# Patient Record
Sex: Female | Born: 1935
Health system: Southern US, Community
[De-identification: ages and names within clinical notes are randomized; demographics above are authoritative.]

## PROBLEM LIST (undated history)

## (undated) DIAGNOSIS — H269 Unspecified cataract: Secondary | ICD-10-CM

## (undated) DIAGNOSIS — F419 Anxiety disorder, unspecified: Secondary | ICD-10-CM

## (undated) DIAGNOSIS — D649 Anemia, unspecified: Secondary | ICD-10-CM

## (undated) DIAGNOSIS — E785 Hyperlipidemia, unspecified: Secondary | ICD-10-CM

## (undated) DIAGNOSIS — G8929 Other chronic pain: Secondary | ICD-10-CM

## (undated) DIAGNOSIS — I739 Peripheral vascular disease, unspecified: Secondary | ICD-10-CM

## (undated) DIAGNOSIS — N63 Unspecified lump in unspecified breast: Secondary | ICD-10-CM

## (undated) DIAGNOSIS — M549 Dorsalgia, unspecified: Secondary | ICD-10-CM

## (undated) DIAGNOSIS — I10 Essential (primary) hypertension: Secondary | ICD-10-CM

## (undated) DIAGNOSIS — M1990 Unspecified osteoarthritis, unspecified site: Secondary | ICD-10-CM

## (undated) HISTORY — DX: Anemia, unspecified: D64.9

## (undated) HISTORY — PX: BACK SURGERY: SHX140

## (undated) HISTORY — PX: COLONOSCOPY: SHX174

## (undated) HISTORY — PX: CARDIOVASCULAR STRESS TEST: SHX262

## (undated) HISTORY — PX: APPENDECTOMY: SHX54

## (undated) HISTORY — PX: BUNIONECTOMY: SHX129

## (undated) HISTORY — PX: BREAST SURGERY: SHX581

## (undated) HISTORY — PX: DILATION AND CURETTAGE OF UTERUS: SHX78

## (undated) HISTORY — PX: TONSILLECTOMY: SUR1361

## (undated) HISTORY — PX: BREAST CYST EXCISION: SHX579

---

## 2003-11-05 ENCOUNTER — Inpatient Hospital Stay (HOSPITAL_COMMUNITY): Admission: RE | Admit: 2003-11-05 | Discharge: 2003-11-07 | Payer: Self-pay | Admitting: Neurosurgery

## 2009-07-28 ENCOUNTER — Encounter: Admission: RE | Admit: 2009-07-28 | Discharge: 2009-07-28 | Payer: Self-pay | Admitting: Neurosurgery

## 2009-09-29 ENCOUNTER — Encounter: Admission: RE | Admit: 2009-09-29 | Discharge: 2009-09-29 | Payer: Self-pay | Admitting: Neurosurgery

## 2011-03-18 NOTE — Op Note (Signed)
NAME:  [name] Mathews, [name] Mathews                          ACCOUNT NO.:  [id number]   MEDICAL RECORD NO.:  [id number]                   PATIENT TYPE:  INP   LOCATION:  3172                                 FACILITY:  [facility]   PHYSICIAN:  Cristi Loron, M.D.            DATE OF BIRTH:  Sep 17, 1934   DATE OF PROCEDURE:  11/05/2003  DATE OF DISCHARGE:                                 OPERATIVE REPORT   INDICATIONS FOR PROCEDURE:  The patient is a 77 year old black female who  has suffered from back and bilateral leg pain with the acute onset of severe  right leg pain.  She failed medical management and was worked up with a  lumbar MRI which demonstrated severe spinal stenosis at L2-3, L3-4, and L4-5  as well as an acute ruptured disk at L3-4 on the right.  I discussed the  various treatment options with the patient including surgery.  The patient  and her family weighed the risks, benefits, and alternatives to surgery and  decided to proceed with a decompressive lumbar laminectomy and  microdiskectomy.   PREOPERATIVE DIAGNOSIS:  L2-3, L3-4, L4-5 spinal stenosis, degenerative disk  disease, right L3-4 herniated nucleus pulposus, lumbar radiculopathy,  lumbago.   POSTOPERATIVE DIAGNOSIS:  L2-3, L3-4, L4-5 spinal stenosis, degenerative  disk disease, right L3-4 herniated nucleus pulposus, lumbar radiculopathy,  lumbago.   PROCEDURE:  L2, L3, and L4 extensive laminectomy, bilateral foraminotomy;  right L3-4 microdiskectomy; using microdissection.   SURGEON:  Cristi Loron, M.D.   ASSISTANT:  Danae Orleans. Venetia Maxon, M.D.   ANESTHESIA:  General endotracheal.   ESTIMATED BLOOD LOSS:  300 mL.   SPECIMENS:  None.   DRAINS:  None.   COMPLICATIONS:  None.   DESCRIPTION OF PROCEDURE:  The patient was brought to the operating room by  the anesthesia team.  General endotracheal anesthesia was induced.  The  patient was turned to the prone position on the Wilson frame.  The  lumbosacral region was  prepared with Betadine scrub and Betadine solution.  Sterile drapes were applied.  I then injected the area to be incised with  Marcaine with epinephrine solution.  I used a scalpel to make a linear  midline incision over the L2-3, L3-4, and L4-5 interspaces.  I used  electrocautery to dissect down to the thoracolumbar fascia and divided the  fascia bilaterally performing a bilateral subperiosteal dissection,  stripping the paraspinous musculature from the bilateral spinous processes  of the lamina of L2, L3, and L4.  I inserted the McCullough retractor for  exposure.  I then obtained the intraoperative radiograph to confirm our  location.  We began by incising the L1-2, L2-3, L3-4, and L4-5 interspinous  ligaments with the scalpel.  We then used the Leksell rongeur to remove the  spinous processes of L2, L3, and L4.  We then brought the operating  microscope into the field and under its magnification and illumination, we  completed the microdissection/decompression.  We used the high speed drill  to perform a bilateral L4, L3, and L2 laminotomy.  We then used the Kerrison  punch to remove the ligamentum flavum at L4-5, complete the L4 laminectomy,  remove the L3-4 ligamentum flavum, complete the L3 laminectomy, remove the  L2-3 ligamentum flavum, and complete the L2 laminectomy, and then remove the  ligamentum flavum at L1-2.  We then used microdissection to free up the  thecal sac from epidural tissue and then dissected down to the lateral  recesses.  There was considerable fibrosis of the ligamentum flavum and  medial facet joint.  We used the Kerrison punch to remove the excess  ligamentum flavum from the lateral recess and perform a generous  foraminotomy about the bilateral 3, 4, and 5 nerve roots.  We did this  bilaterally.  Then, Dr. Venetia Maxon gently retracted the right L3-4 thecal sac  medially and we inspected the intervertebral disk at L3-4. The disk was  bulging, but there was no  significant neural compression pending  herniations.  We dissected in a cephalad direction and noted a free fragment  of herniated disk which had clearly migrated in a cephalad direction and was  just ventral to the exiting right L3 nerve root as it exited the  neuroforamen.  We freed up the disk herniation using microdissection and  then removed it in multiple fragments using the micropituitary forceps and  the pituitary forceps.  We then palpated under the thecal sac and along the  exit route of the L3 nerve root all the way through the neuroforamen and  noted it was well decompressed and there were no more disk fragments.  We  did encounter some vigorous epidural bleeding once we removed the disk  herniation and this was controlled with bipolar electrocautery and Gelfoam.  WE then achieved astringent hemostasis using bipolar electrocautery.  We  copiously irrigated the wound out with bacitracin solution, removed the  solution, and then removed the [facility] retractor.  We then reapproximated  the patient's thoracolumbar fascia with interrupted #1 Vicryl suture,  subcutaneous tissue with interrupted 2-0 Vicryl suture, and the skin with  Steri-Strips and Benzoin.  The wound was then coated with bacitracin  ointment.  Sterile dressings were applied.  The drapes were removed and the  patient was subsequently returned to the supine position where she was  extubated by the anesthesia team and transported to the postanesthesia care  unit in stable condition.  All needle, sponge, and instrument counts  correct.                                               Cristi Loron, M.D.    JDJ/MEDQ  D:  11/05/2003  T:  11/06/2003  Job:  [id number]

## 2012-03-05 ENCOUNTER — Other Ambulatory Visit: Payer: Self-pay | Admitting: Neurosurgery

## 2012-03-05 DIAGNOSIS — M549 Dorsalgia, unspecified: Secondary | ICD-10-CM

## 2012-03-29 ENCOUNTER — Ambulatory Visit
Admission: RE | Admit: 2012-03-29 | Discharge: 2012-03-29 | Disposition: A | Discharge status: Home / Self Care | Payer: Medicare Other | Source: Ambulatory Visit | Attending: Neurosurgery | Admitting: Neurosurgery

## 2012-03-29 DIAGNOSIS — M549 Dorsalgia, unspecified: Secondary | ICD-10-CM

## 2012-03-29 MED ORDER — GADOBENATE DIMEGLUMINE 529 MG/ML IV SOLN
20.0000 mL | Freq: Once | INTRAVENOUS | Status: AC | PRN
  Administered 2012-03-29: 20 mL via INTRAVENOUS

## 2012-04-20 ENCOUNTER — Other Ambulatory Visit: Payer: Self-pay | Admitting: Neurosurgery

## 2012-04-20 ENCOUNTER — Other Ambulatory Visit (HOSPITAL_COMMUNITY): Payer: Self-pay | Admitting: Neurosurgery

## 2012-04-20 ENCOUNTER — Encounter (HOSPITAL_COMMUNITY): Payer: Self-pay | Admitting: Pharmacy Technician

## 2012-04-20 DIAGNOSIS — M545 Low back pain: Secondary | ICD-10-CM

## 2012-04-27 ENCOUNTER — Other Ambulatory Visit (HOSPITAL_COMMUNITY): Payer: Self-pay | Admitting: Neurosurgery

## 2012-04-27 ENCOUNTER — Ambulatory Visit (HOSPITAL_COMMUNITY)
Admission: RE | Admit: 2012-04-27 | Discharge: 2012-04-27 | Disposition: A | Discharge status: Home / Self Care | Payer: Medicare Other | Source: Ambulatory Visit | Attending: Neurosurgery | Admitting: Neurosurgery

## 2012-04-27 DIAGNOSIS — M47817 Spondylosis without myelopathy or radiculopathy, lumbosacral region: Secondary | ICD-10-CM | POA: Insufficient documentation

## 2012-04-27 DIAGNOSIS — M545 Low back pain: Secondary | ICD-10-CM

## 2012-04-27 LAB — GLUCOSE, CAPILLARY
Glucose-Capillary: 161 mg/dL — ABNORMAL HIGH (ref 70–99)
Glucose-Capillary: 136 mg/dL — ABNORMAL HIGH (ref 70–99)

## 2012-04-27 MED ORDER — ONDANSETRON HCL 4 MG/2ML IJ SOLN: 4.0000 mg | Freq: Four times a day (QID) | INTRAMUSCULAR | Status: DC | PRN

## 2012-04-27 MED ORDER — OXYCODONE-ACETAMINOPHEN 5-325 MG PO TABS: 1.0000 | ORAL_TABLET | ORAL | Status: DC | PRN

## 2012-04-27 MED ORDER — DIAZEPAM 5 MG PO TABS
ORAL_TABLET | ORAL | Status: AC
  Filled 2012-04-27: qty 2

## 2012-04-27 MED ORDER — MORPHINE SULFATE 4 MG/ML IJ SOLN: 2.0000 mg | INTRAMUSCULAR | Status: DC | PRN

## 2012-04-27 MED ORDER — DIAZEPAM 5 MG PO TABS
10.0000 mg | ORAL_TABLET | Freq: Once | ORAL | Status: AC
  Administered 2012-04-27: 10 mg via ORAL

## 2012-04-27 MED ORDER — IOHEXOL 180 MG/ML  SOLN
20.0000 mL | Freq: Once | INTRAMUSCULAR | Status: AC | PRN
  Administered 2012-04-27: 15 mL via INTRATHECAL

## 2012-04-27 MED ORDER — DIAZEPAM 5 MG PO TABS: 5.0000 mg | ORAL_TABLET | Freq: Once | ORAL | Status: DC

## 2012-04-27 NOTE — Discharge Instructions (Signed)
Myelography Care After These instructions give you information on caring for yourself after your procedure. Your doctor may also give you specific instructions. Call your doctor if you have any problems or questions after your procedure. HOME CARE  Lie down for 24 hours. Lie in any position with 1 pillow under your head.   For 24 hours, get up only to eat or use the bathroom. Take only 10 minutes to eat.   For 24 hours, drink enough fluids to keep your pee (urine) clear or pale yellow. No alcohol.   Take all medicine as told by your doctor.   Avoid heavy lifting and activity for 48 hours.   You may take the bandage off the day after your myelography.   Do not take a bath for 24 hours. Ask your doctor if it is okay to take a shower.  Finding out the results of your test Ask your doctor when your test results will be ready. Make sure you follow up and get the test results. GET HELP RIGHT AWAY IF:   Any of the places where the needles were put in:   Are puffy (swollen) or red.   Are sore or hot to the touch.   Are draining yellowish-white fluid (pus).   Are bleeding after 10 minutes of pressing down on the site. Have someone press on any place that is bleeding until seen by a doctor.   You have a lasting headache that is not helped by medicine.   You have a bad headache with a stiff neck or fever.   You have trouble breathing.   You feel sick to your stomach (nauseous) or throw up (vomit).   You have pain or cramping in your belly (abdomen).   You have a fever.  If you go to the emergency room, tell the doctor you had a myelogram. Take this paper with you to show the doctor. MAKE SURE YOU:  Understand these instructions.   Will watch your condition.   Will get help right away if you are not doing well or get worse.  Document Released: 07/26/2008 Document Revised: 10/06/2011 Document Reviewed: 07/26/2008 [employer] Patient Information 2012 [employer]. 

## 2012-04-27 NOTE — H&P (Signed)
Subjective: Patient is a 77 year old black female who I performed a decompressive laminectomy on several years ago. Patient has done well but has developed recurrent back and hip pain. She was worked up with an lumbar MRI which was equivocal. We discussed further workup with a lumbar mild CT. The patient decided proceed with that study after weighing the risks, benefits, and alternatives.   No past medical history on file.  No past surgical history on file.  Allergies  Allergen Reactions  . Januvia (Sitagliptin) Other (See Comments)    Numbness in mouth  . Simvastatin Other (See Comments)    tremors    History  Substance Use Topics  . Smoking status: Not on file  . Smokeless tobacco: Not on file  . Alcohol Use: Not on file    No family history on file. Prior to Admission medications   Medication Sig Start Date End Date Taking? Authorizing Provider  amLODipine (NORVASC) 10 MG tablet Take 10 mg by mouth daily.   Yes Historical Provider, MD  aspirin 81 MG tablet Take 81 mg by mouth daily.   Yes Historical Provider, MD  cetirizine (ZYRTEC) 10 MG tablet Take 10 mg by mouth daily.   Yes Historical Provider, MD  Ferrous Gluconate (IRON) 240 (27 FE) MG TABS Take 1 tablet by mouth daily.   Yes Historical Provider, MD  glyBURIDE-metformin (GLUCOVANCE) 5-500 MG per tablet Take 1 tablet by mouth 2 (two) times daily with a meal.   Yes Historical Provider, MD  rosuvastatin (CRESTOR) 10 MG tablet Take 5 mg by mouth daily.   Yes Historical Provider, MD  telmisartan (MICARDIS) 40 MG tablet Take 40 mg by mouth daily.   Yes Historical Provider, MD     Review of Systems  Positive ROS: As above  All other systems have been reviewed and were otherwise negative with the exception of those mentioned in the HPI and as above.  Objective: Vital signs in last 24 hours: Temp:  [98 F (36.7 C)] 98 F (36.7 C) (06/28 1610) Pulse Rate:  [83] 83  (06/28 0614) Resp:  [18] 18  (06/28 0614) BP: (135)/(81)  135/81 mmHg (06/28 0614) SpO2:  [97 %] 97 % (06/28 0614) Weight:  [98.431 kg (217 lb)] 98.431 kg (217 lb) (06/28 9604)  General Appearance: Alert, cooperative, no distress, appears stated age Head: Normocephalic, without obvious abnormality, atraumatic Eyes: PERRL, conjunctiva/corneas clear, EOM's intact, fundi benign, both eyes      Ears: Normal TM's and external ear canals, both ears Throat: Lips, mucosa, and tongue normal; teeth and gums normal Neck: Supple, symmetrical, trachea midline, no adenopathy; thyroid: No enlargement/tenderness/nodules; no carotid bruit or JVD Back: Symmetric, no curvature, ROM normal, no CVA tenderness the patient's lumbar incision is well-healed Lungs: Clear to auscultation bilaterally, respirations unlabored Heart: Regular rate and rhythm, S1 and S2 normal, no murmur, rub or gallop Abdomen: Soft, non-tender, bowel sounds active all four quadrants, no masses, no organomegaly Extremities: Extremities normal, atraumatic, no cyanosis or edema Pulses: 2+ and symmetric all extremities Skin: Skin color, texture, turgor normal, no rashes or lesions  NEUROLOGIC:   Mental status: alert and oriented, no aphasia, good attention span, Fund of knowledge/ memory ok Motor Exam - grossly normal Sensory Exam - grossly normal Reflexes:  Coordination - grossly normal Gait - grossly normal Balance - grossly normal Cranial Nerves: I: smell Not tested  II: visual acuity  OS: Normal    OD: Normal   II: visual fields Full to confrontation  II: pupils  Equal, round, reactive to light  III,VII: ptosis None  III,IV,VI: extraocular muscles  Full ROM  V: mastication Normal  V: facial light touch sensation  Normal  V,VII: corneal reflex  Present  VII: facial muscle function - upper  Normal  VII: facial muscle function - lower Normal  VIII: hearing Not tested  IX: soft palate elevation  Normal  IX,X: gag reflex Present  XI: trapezius strength  5/5  XI: sternocleidomastoid  strength 5/5  XI: neck flexion strength  5/5  XII: tongue strength  Normal    Data Review No results found for this basename: WBC, HGB, HCT, MCV, PLT   No results found for this basename: NA, K, CL, CO2, BUN, creatinine, glucose   No results found for this basename: INR, PROTIME    Assessment/Plan: Lumbago, lumbar radiculopathy: I discussed situation with the patient and her daughter's. We discussed further workup with a lumbar mild CT. I described the risks, benefits, and alternatives to the procedure. She was to proceed with the procedure.   Joann Jorge D 04/27/2012 7:36 AM

## 2012-06-22 ENCOUNTER — Other Ambulatory Visit: Payer: Self-pay | Admitting: Neurosurgery

## 2012-06-26 ENCOUNTER — Encounter (HOSPITAL_COMMUNITY): Payer: Self-pay | Admitting: Respiratory Therapy

## 2012-07-03 ENCOUNTER — Encounter (HOSPITAL_COMMUNITY): Payer: Self-pay

## 2012-07-03 ENCOUNTER — Encounter (HOSPITAL_COMMUNITY)
Admission: RE | Admit: 2012-07-03 | Discharge: 2012-07-03 | Disposition: A | Discharge status: Home / Self Care | Payer: Medicare Other | Source: Ambulatory Visit | Attending: Neurosurgery | Admitting: Neurosurgery

## 2012-07-03 HISTORY — DX: Anxiety disorder, unspecified: F41.9

## 2012-07-03 HISTORY — DX: Essential (primary) hypertension: I10

## 2012-07-03 HISTORY — DX: Peripheral vascular disease, unspecified: I73.9

## 2012-07-03 HISTORY — DX: Unspecified cataract: H26.9

## 2012-07-03 HISTORY — DX: Dorsalgia, unspecified: M54.9

## 2012-07-03 HISTORY — DX: Hyperlipidemia, unspecified: E78.5

## 2012-07-03 HISTORY — DX: Anemia, unspecified: D64.9

## 2012-07-03 HISTORY — DX: Other chronic pain: G89.29

## 2012-07-03 HISTORY — DX: Unspecified osteoarthritis, unspecified site: M19.90

## 2012-07-03 HISTORY — DX: Unspecified lump in unspecified breast: N63.0

## 2012-07-03 LAB — TYPE AND SCREEN
ABO/RH(D): A POS
Antibody Screen: NEGATIVE

## 2012-07-03 LAB — BASIC METABOLIC PANEL
Chloride: 99 mEq/L (ref 96–112)
GFR calc Af Amer: 48 mL/min — ABNORMAL LOW (ref >90)
GFR calc non Af Amer: 42 mL/min — ABNORMAL LOW (ref >90)
Glucose, Bld: 165 mg/dL — ABNORMAL HIGH (ref 70–99)
Potassium: 4.6 mEq/L (ref 3.5–5.1)
Sodium: 137 mEq/L (ref 135–145)

## 2012-07-03 LAB — CBC
Hemoglobin: 11.7 g/dL — ABNORMAL LOW (ref 12.0–15.0)
RBC: 4.26 MIL/uL (ref 3.87–5.11)

## 2012-07-03 MED ORDER — CEFAZOLIN SODIUM-DEXTROSE 2-3 GM-% IV SOLR
2.0000 g | INTRAVENOUS | Status: AC
  Administered 2012-07-04: 2 g via INTRAVENOUS
  Administered 2012-07-04: 1 g via INTRAVENOUS
  Filled 2012-07-03: qty 50

## 2012-07-03 NOTE — Pre-Procedure Instructions (Signed)
20 EMMERSEN GARRAWAY  07/03/2012   Your procedure is scheduled on:  Wednesday July 04, 2012  Report to [facility] at 6:30 AM.  Call this number if you have problems the morning of surgery: [phone]   Remember:   Do not eat food or drink:After Midnight.      Take these medicines the morning of surgery with A SIP OF WATER: amlodipine, zyrtec, ativan, tramadol,    Do not wear jewelry, make-up or nail polish.  Do not wear lotions, powders, or perfumes. You may wear deodorant.  Do not shave 48 hours prior to surgery. Men may shave face and neck.  Do not bring valuables to the hospital.  Contacts, dentures or bridgework may not be worn into surgery.  Leave suitcase in the car. After surgery it may be brought to your room.  For patients admitted to the hospital, checkout time is 11:00 AM the day of discharge.   Patients discharged the day of surgery will not be allowed to drive home.  Name and phone number of your driver: Silvio Pate or Jaydalyn Demattia (daughters)  Special Instructions: CHG Shower Use Special Wash: 1/2 bottle night before surgery and 1/2 bottle morning of surgery.   Please read over the following fact sheets that you were given: Pain Booklet, Coughing and Deep Breathing, Blood Transfusion Information, MRSA Information and Surgical Site Infection Prevention

## 2012-07-04 ENCOUNTER — Encounter (HOSPITAL_COMMUNITY)
Admission: RE | Disposition: A | Discharge status: Rehabilitation Facility | Payer: Self-pay | Source: Ambulatory Visit | Attending: Neurosurgery

## 2012-07-04 ENCOUNTER — Inpatient Hospital Stay (HOSPITAL_COMMUNITY): Payer: Medicare Other

## 2012-07-04 ENCOUNTER — Inpatient Hospital Stay (HOSPITAL_COMMUNITY)
Admission: RE | Admit: 2012-07-04 | Discharge: 2012-07-10 | DRG: 459 | Disposition: A | Discharge status: Rehabilitation Facility | Payer: Medicare Other | Source: Ambulatory Visit | Attending: Neurosurgery | Admitting: Neurosurgery

## 2012-07-04 ENCOUNTER — Encounter (HOSPITAL_COMMUNITY): Payer: Self-pay | Admitting: Anesthesiology

## 2012-07-04 ENCOUNTER — Inpatient Hospital Stay (HOSPITAL_COMMUNITY): Payer: Medicare Other | Admitting: Anesthesiology

## 2012-07-04 DIAGNOSIS — M129 Arthropathy, unspecified: Secondary | ICD-10-CM | POA: Diagnosis present

## 2012-07-04 DIAGNOSIS — I10 Essential (primary) hypertension: Secondary | ICD-10-CM | POA: Diagnosis present

## 2012-07-04 DIAGNOSIS — G92 Toxic encephalopathy: Secondary | ICD-10-CM | POA: Diagnosis not present

## 2012-07-04 DIAGNOSIS — R000 Tachycardia, unspecified: Secondary | ICD-10-CM | POA: Diagnosis not present

## 2012-07-04 DIAGNOSIS — Z01812 Encounter for preprocedural laboratory examination: Secondary | ICD-10-CM

## 2012-07-04 DIAGNOSIS — E785 Hyperlipidemia, unspecified: Secondary | ICD-10-CM | POA: Diagnosis present

## 2012-07-04 DIAGNOSIS — E871 Hypo-osmolality and hyponatremia: Secondary | ICD-10-CM | POA: Diagnosis not present

## 2012-07-04 DIAGNOSIS — E1149 Type 2 diabetes mellitus with other diabetic neurological complication: Secondary | ICD-10-CM | POA: Diagnosis present

## 2012-07-04 DIAGNOSIS — Q762 Congenital spondylolisthesis: Principal | ICD-10-CM

## 2012-07-04 DIAGNOSIS — G9340 Encephalopathy, unspecified: Secondary | ICD-10-CM | POA: Diagnosis not present

## 2012-07-04 DIAGNOSIS — M51379 Other intervertebral disc degeneration, lumbosacral region without mention of lumbar back pain or lower extremity pain: Secondary | ICD-10-CM | POA: Diagnosis present

## 2012-07-04 DIAGNOSIS — F411 Generalized anxiety disorder: Secondary | ICD-10-CM | POA: Diagnosis present

## 2012-07-04 DIAGNOSIS — M47817 Spondylosis without myelopathy or radiculopathy, lumbosacral region: Secondary | ICD-10-CM | POA: Diagnosis present

## 2012-07-04 DIAGNOSIS — E119 Type 2 diabetes mellitus without complications: Secondary | ICD-10-CM | POA: Diagnosis present

## 2012-07-04 DIAGNOSIS — Z7982 Long term (current) use of aspirin: Secondary | ICD-10-CM

## 2012-07-04 DIAGNOSIS — I739 Peripheral vascular disease, unspecified: Secondary | ICD-10-CM | POA: Diagnosis present

## 2012-07-04 DIAGNOSIS — N390 Urinary tract infection, site not specified: Secondary | ICD-10-CM | POA: Diagnosis not present

## 2012-07-04 DIAGNOSIS — M5137 Other intervertebral disc degeneration, lumbosacral region: Secondary | ICD-10-CM | POA: Diagnosis present

## 2012-07-04 DIAGNOSIS — D62 Acute posthemorrhagic anemia: Secondary | ICD-10-CM | POA: Diagnosis not present

## 2012-07-04 DIAGNOSIS — K5900 Constipation, unspecified: Secondary | ICD-10-CM | POA: Diagnosis not present

## 2012-07-04 DIAGNOSIS — Z79899 Other long term (current) drug therapy: Secondary | ICD-10-CM

## 2012-07-04 DIAGNOSIS — E1142 Type 2 diabetes mellitus with diabetic polyneuropathy: Secondary | ICD-10-CM | POA: Diagnosis present

## 2012-07-04 DIAGNOSIS — G929 Unspecified toxic encephalopathy: Secondary | ICD-10-CM | POA: Diagnosis not present

## 2012-07-04 LAB — GLUCOSE, CAPILLARY: Glucose-Capillary: 315 mg/dL — ABNORMAL HIGH (ref 70–99)

## 2012-07-04 SURGERY — POSTERIOR LUMBAR FUSION 2 LEVEL
Anesthesia: General

## 2012-07-04 MED ORDER — SODIUM CHLORIDE 0.9 % IV SOLN
INTRAVENOUS | Status: AC
  Filled 2012-07-04: qty 500

## 2012-07-04 MED ORDER — CEFAZOLIN SODIUM 1-5 GM-% IV SOLN
INTRAVENOUS | Status: AC
  Filled 2012-07-04: qty 50

## 2012-07-04 MED ORDER — DIPHENHYDRAMINE HCL 12.5 MG/5ML PO ELIX: 12.5000 mg | ORAL_SOLUTION | Freq: Four times a day (QID) | ORAL | Status: DC | PRN

## 2012-07-04 MED ORDER — FENTANYL CITRATE 0.05 MG/ML IJ SOLN
INTRAMUSCULAR | Status: DC | PRN
  Administered 2012-07-04 (×3): 50 ug via INTRAVENOUS
  Administered 2012-07-04: 150 ug via INTRAVENOUS
  Administered 2012-07-04: 50 ug via INTRAVENOUS

## 2012-07-04 MED ORDER — BACITRACIN 50000 UNITS IM SOLR
INTRAMUSCULAR | Status: AC
  Filled 2012-07-04: qty 1

## 2012-07-04 MED ORDER — BUPIVACAINE-EPINEPHRINE PF 0.5-1:200000 % IJ SOLN
INTRAMUSCULAR | Status: DC | PRN
  Administered 2012-07-04: 20 mL
  Administered 2012-07-04: 10 mL

## 2012-07-04 MED ORDER — DIAZEPAM 5 MG PO TABS: 5.0000 mg | ORAL_TABLET | Freq: Four times a day (QID) | ORAL | Status: DC | PRN

## 2012-07-04 MED ORDER — MORPHINE SULFATE (PF) 1 MG/ML IV SOLN
INTRAVENOUS | Status: DC
  Administered 2012-07-04: 16:00:00 via INTRAVENOUS

## 2012-07-04 MED ORDER — MORPHINE SULFATE 10 MG/ML IJ SOLN
INTRAMUSCULAR | Status: DC | PRN
  Administered 2012-07-04 (×2): 5 mg via INTRAVENOUS

## 2012-07-04 MED ORDER — GLYCOPYRROLATE 0.2 MG/ML IJ SOLN
INTRAMUSCULAR | Status: DC | PRN
  Administered 2012-07-04: .6 mg via INTRAVENOUS

## 2012-07-04 MED ORDER — OXYCODONE-ACETAMINOPHEN 5-325 MG PO TABS
1.0000 | ORAL_TABLET | ORAL | Status: DC | PRN
  Administered 2012-07-05: 2 via ORAL
  Filled 2012-07-04: qty 2

## 2012-07-04 MED ORDER — MENTHOL 3 MG MT LOZG
1.0000 | LOZENGE | OROMUCOSAL | Status: DC | PRN
  Filled 2012-07-04: qty 9

## 2012-07-04 MED ORDER — LIDOCAINE HCL (CARDIAC) 20 MG/ML IV SOLN
INTRAVENOUS | Status: DC | PRN
  Administered 2012-07-04: 50 mg via INTRAVENOUS

## 2012-07-04 MED ORDER — 0.9 % SODIUM CHLORIDE (POUR BTL) OPTIME
TOPICAL | Status: DC | PRN
  Administered 2012-07-04: 1000 mL

## 2012-07-04 MED ORDER — PROPOFOL 10 MG/ML IV EMUL
INTRAVENOUS | Status: DC | PRN
  Administered 2012-07-04: 120 mg via INTRAVENOUS

## 2012-07-04 MED ORDER — ROCURONIUM BROMIDE 100 MG/10ML IV SOLN
INTRAVENOUS | Status: DC | PRN
  Administered 2012-07-04: 50 mg via INTRAVENOUS

## 2012-07-04 MED ORDER — DOCUSATE SODIUM 100 MG PO CAPS
100.0000 mg | ORAL_CAPSULE | Freq: Two times a day (BID) | ORAL | Status: DC
  Administered 2012-07-04 – 2012-07-10 (×11): 100 mg via ORAL
  Filled 2012-07-04 (×11): qty 1

## 2012-07-04 MED ORDER — ACETAMINOPHEN 325 MG PO TABS
650.0000 mg | ORAL_TABLET | ORAL | Status: DC | PRN
  Administered 2012-07-05 – 2012-07-09 (×14): 650 mg via ORAL
  Filled 2012-07-04 (×13): qty 2

## 2012-07-04 MED ORDER — HEMOSTATIC AGENTS (NO CHARGE) OPTIME
TOPICAL | Status: DC | PRN
  Administered 2012-07-04 (×2): 1 via TOPICAL

## 2012-07-04 MED ORDER — ZOLPIDEM TARTRATE 5 MG PO TABS: 5.0000 mg | ORAL_TABLET | Freq: Every evening | ORAL | Status: DC | PRN

## 2012-07-04 MED ORDER — ONDANSETRON HCL 4 MG/2ML IJ SOLN
INTRAMUSCULAR | Status: DC | PRN
  Administered 2012-07-04: 4 mg via INTRAVENOUS

## 2012-07-04 MED ORDER — MORPHINE SULFATE (PF) 1 MG/ML IV SOLN
INTRAVENOUS | Status: AC
  Filled 2012-07-04: qty 25

## 2012-07-04 MED ORDER — LACTATED RINGERS IV SOLN
INTRAVENOUS | Status: DC
  Administered 2012-07-04: 19:00:00 via INTRAVENOUS

## 2012-07-04 MED ORDER — ONDANSETRON HCL 4 MG/2ML IJ SOLN: 4.0000 mg | Freq: Four times a day (QID) | INTRAMUSCULAR | Status: DC | PRN

## 2012-07-04 MED ORDER — LACTATED RINGERS IV SOLN
INTRAVENOUS | Status: DC | PRN
  Administered 2012-07-04 (×4): via INTRAVENOUS

## 2012-07-04 MED ORDER — VECURONIUM BROMIDE 10 MG IV SOLR
INTRAVENOUS | Status: DC | PRN
  Administered 2012-07-04 (×3): 2 mg via INTRAVENOUS
  Administered 2012-07-04: 3 mg via INTRAVENOUS

## 2012-07-04 MED ORDER — CEFAZOLIN SODIUM-DEXTROSE 2-3 GM-% IV SOLR
2.0000 g | Freq: Three times a day (TID) | INTRAVENOUS | Status: AC
  Administered 2012-07-04 – 2012-07-05 (×2): 2 g via INTRAVENOUS
  Filled 2012-07-04 (×2): qty 50

## 2012-07-04 MED ORDER — NEOSTIGMINE METHYLSULFATE 1 MG/ML IJ SOLN
INTRAMUSCULAR | Status: DC | PRN
  Administered 2012-07-04: 4 mg via INTRAVENOUS

## 2012-07-04 MED ORDER — NALOXONE HCL 0.4 MG/ML IJ SOLN: 0.4000 mg | INTRAMUSCULAR | Status: DC | PRN

## 2012-07-04 MED ORDER — INSULIN ASPART 100 UNIT/ML ~~LOC~~ SOLN
0.0000 [IU] | SUBCUTANEOUS | Status: DC
  Administered 2012-07-04: 15 [IU] via SUBCUTANEOUS
  Administered 2012-07-05: 3 [IU] via SUBCUTANEOUS
  Administered 2012-07-05 (×4): 4 [IU] via SUBCUTANEOUS
  Administered 2012-07-05: 7 [IU] via SUBCUTANEOUS
  Administered 2012-07-06: 4 [IU] via SUBCUTANEOUS
  Administered 2012-07-06: 7 [IU] via SUBCUTANEOUS
  Administered 2012-07-06 (×3): 4 [IU] via SUBCUTANEOUS
  Administered 2012-07-06: 7 [IU] via SUBCUTANEOUS
  Administered 2012-07-07 (×2): 4 [IU] via SUBCUTANEOUS
  Administered 2012-07-07: 7 [IU] via SUBCUTANEOUS
  Administered 2012-07-07: 4 [IU] via SUBCUTANEOUS
  Administered 2012-07-07: 04:00:00 via SUBCUTANEOUS
  Administered 2012-07-07: 4 [IU] via SUBCUTANEOUS
  Administered 2012-07-08: 3 [IU] via SUBCUTANEOUS
  Administered 2012-07-08: 4 [IU] via SUBCUTANEOUS
  Administered 2012-07-08: 7 [IU] via SUBCUTANEOUS
  Administered 2012-07-08: 4 [IU] via SUBCUTANEOUS

## 2012-07-04 MED ORDER — SODIUM CHLORIDE 0.9 % IR SOLN
Status: DC | PRN
  Administered 2012-07-04: 10:00:00

## 2012-07-04 MED ORDER — THROMBIN 5000 UNITS EX SOLR
CUTANEOUS | Status: DC | PRN
  Administered 2012-07-04 (×4): 5000 [IU] via TOPICAL

## 2012-07-04 MED ORDER — HYDROCODONE-ACETAMINOPHEN 5-325 MG PO TABS
1.0000 | ORAL_TABLET | ORAL | Status: DC | PRN
  Administered 2012-07-05: 1 via ORAL
  Filled 2012-07-04: qty 2

## 2012-07-04 MED ORDER — HEPARIN SODIUM (PORCINE) 1000 UNIT/ML IJ SOLN
INTRAMUSCULAR | Status: AC
  Filled 2012-07-04: qty 1

## 2012-07-04 MED ORDER — EPHEDRINE SULFATE 50 MG/ML IJ SOLN
INTRAMUSCULAR | Status: DC | PRN
  Administered 2012-07-04: 5 mg via INTRAVENOUS
  Administered 2012-07-04 (×2): 10 mg via INTRAVENOUS
  Administered 2012-07-04: 15 mg via INTRAVENOUS
  Administered 2012-07-04: 5 mg via INTRAVENOUS

## 2012-07-04 MED ORDER — PHENYLEPHRINE HCL 10 MG/ML IJ SOLN
INTRAMUSCULAR | Status: DC | PRN
  Administered 2012-07-04: 80 ug via INTRAVENOUS

## 2012-07-04 MED ORDER — SODIUM CHLORIDE 0.9 % IJ SOLN: 9.0000 mL | INTRAMUSCULAR | Status: DC | PRN

## 2012-07-04 MED ORDER — HYDROMORPHONE HCL PF 1 MG/ML IJ SOLN: 0.2500 mg | INTRAMUSCULAR | Status: DC | PRN

## 2012-07-04 MED ORDER — PHENOL 1.4 % MT LIQD
1.0000 | OROMUCOSAL | Status: DC | PRN
  Administered 2012-07-04: 1 via OROMUCOSAL
  Filled 2012-07-04: qty 177

## 2012-07-04 MED ORDER — OXYCODONE HCL 5 MG PO TABS: 5.0000 mg | ORAL_TABLET | Freq: Once | ORAL | Status: DC | PRN

## 2012-07-04 MED ORDER — OXYCODONE HCL 5 MG/5ML PO SOLN: 5.0000 mg | Freq: Once | ORAL | Status: DC | PRN

## 2012-07-04 MED ORDER — DIPHENHYDRAMINE HCL 50 MG/ML IJ SOLN: 12.5000 mg | Freq: Four times a day (QID) | INTRAMUSCULAR | Status: DC | PRN

## 2012-07-04 MED ORDER — ACETAMINOPHEN 650 MG RE SUPP
650.0000 mg | RECTAL | Status: DC | PRN
  Filled 2012-07-04: qty 1

## 2012-07-04 MED ORDER — BACITRACIN ZINC 500 UNIT/GM EX OINT
TOPICAL_OINTMENT | CUTANEOUS | Status: DC | PRN
  Administered 2012-07-04: 1 via TOPICAL

## 2012-07-04 MED ORDER — ONDANSETRON HCL 4 MG/2ML IJ SOLN: 4.0000 mg | INTRAMUSCULAR | Status: DC | PRN

## 2012-07-04 SURGICAL SUPPLY — 74 items
BAG DECANTER FOR FLEXI CONT (MISCELLANEOUS) ×2 IMPLANT
BENZOIN TINCTURE PRP APPL 2/3 (GAUZE/BANDAGES/DRESSINGS) ×2 IMPLANT
BLADE SURG ROTATE 9660 (MISCELLANEOUS) IMPLANT
BRUSH SCRUB EZ PLAIN DRY (MISCELLANEOUS) ×2 IMPLANT
BUR ACORN 6.0 (BURR) ×2 IMPLANT
BUR MATCHSTICK NEURO 3.0 LAGG (BURR) ×2 IMPLANT
CANISTER SUCTION 2500CC (MISCELLANEOUS) ×2 IMPLANT
CAP REVERE LOCKING (Cap) ×12 IMPLANT
CLOTH BEACON ORANGE TIMEOUT ST (SAFETY) ×2 IMPLANT
CONN CROSSLINK REV 6.35 48-60 (Connector) ×2 IMPLANT
CONNECTOR CRSLNK REV6.35 48-60 (Connector) ×1 IMPLANT
CONT SPEC 4OZ CLIKSEAL STRL BL (MISCELLANEOUS) ×2 IMPLANT
COVER BACK TABLE 24X17X13 BIG (DRAPES) IMPLANT
COVER TABLE BACK 60X90 (DRAPES) ×2 IMPLANT
DRAPE C-ARM 42X72 X-RAY (DRAPES) ×4 IMPLANT
DRAPE LAPAROTOMY 100X72X124 (DRAPES) ×2 IMPLANT
DRAPE POUCH INSTRU U-SHP 10X18 (DRAPES) ×2 IMPLANT
DRAPE PROXIMA HALF (DRAPES) ×2 IMPLANT
DRAPE SURG 17X23 STRL (DRAPES) ×8 IMPLANT
ELECT BLADE 4.0 EZ CLEAN MEGAD (MISCELLANEOUS) ×2
ELECT REM PT RETURN 9FT ADLT (ELECTROSURGICAL) ×2
ELECTRODE BLDE 4.0 EZ CLN MEGD (MISCELLANEOUS) ×1 IMPLANT
ELECTRODE REM PT RTRN 9FT ADLT (ELECTROSURGICAL) ×1 IMPLANT
GAUZE SPONGE 4X4 16PLY XRAY LF (GAUZE/BANDAGES/DRESSINGS) ×2 IMPLANT
GLOVE BIO SURGEON STRL SZ 6.5 (GLOVE) ×2 IMPLANT
GLOVE BIO SURGEON STRL SZ8.5 (GLOVE) ×4 IMPLANT
GLOVE BIOGEL PI IND STRL 7.0 (GLOVE) ×1 IMPLANT
GLOVE BIOGEL PI IND STRL 8 (GLOVE) ×1 IMPLANT
GLOVE BIOGEL PI IND STRL 8.5 (GLOVE) ×2 IMPLANT
GLOVE BIOGEL PI INDICATOR 7.0 (GLOVE) ×1
GLOVE BIOGEL PI INDICATOR 8 (GLOVE) ×1
GLOVE BIOGEL PI INDICATOR 8.5 (GLOVE) ×2
GLOVE ECLIPSE 7.5 STRL STRAW (GLOVE) ×4 IMPLANT
GLOVE EXAM NITRILE LRG STRL (GLOVE) IMPLANT
GLOVE EXAM NITRILE MD LF STRL (GLOVE) ×4 IMPLANT
GLOVE EXAM NITRILE XL STR (GLOVE) IMPLANT
GLOVE EXAM NITRILE XS STR PU (GLOVE) IMPLANT
GLOVE SS BIOGEL STRL SZ 8 (GLOVE) ×2 IMPLANT
GLOVE SUPERSENSE BIOGEL SZ 8 (GLOVE) ×2
GLOVE SURG SS PI 8.0 STRL IVOR (GLOVE) ×6 IMPLANT
GOWN BRE IMP SLV AUR LG STRL (GOWN DISPOSABLE) ×2 IMPLANT
GOWN BRE IMP SLV AUR XL STRL (GOWN DISPOSABLE) ×4 IMPLANT
GOWN STRL REIN 2XL LVL4 (GOWN DISPOSABLE) ×6 IMPLANT
KIT BASIN OR (CUSTOM PROCEDURE TRAY) ×2 IMPLANT
KIT ROOM TURNOVER OR (KITS) ×2 IMPLANT
NEEDLE HYPO 21X1.5 SAFETY (NEEDLE) IMPLANT
NEEDLE HYPO 22GX1.5 SAFETY (NEEDLE) ×2 IMPLANT
NS IRRIG 1000ML POUR BTL (IV SOLUTION) ×2 IMPLANT
PACK FOAM VITOSS 10CC (Orthopedic Implant) ×2 IMPLANT
PACK LAMINECTOMY NEURO (CUSTOM PROCEDURE TRAY) ×2 IMPLANT
PAD ARMBOARD 7.5X6 YLW CONV (MISCELLANEOUS) ×6 IMPLANT
PATTIES SURGICAL .5 X1 (DISPOSABLE) IMPLANT
PENCIL BUTTON HOLSTER BLD 10FT (ELECTRODE) ×2 IMPLANT
PUTTY 10ML ACTIFUSE ABX (Putty) ×2 IMPLANT
PUTTY 5ML ACTIFUSE ABX (Putty) ×2 IMPLANT
ROD REVERE 6.35 CURVED 85MM (Rod) ×4 IMPLANT
SCREW 7.5X50MM (Screw) ×12 IMPLANT
SPACER SUSTAIN O 10X26 12MM (Spacer) ×8 IMPLANT
SPONGE GAUZE 4X4 12PLY (GAUZE/BANDAGES/DRESSINGS) ×2 IMPLANT
SPONGE LAP 4X18 X RAY DECT (DISPOSABLE) IMPLANT
SPONGE NEURO XRAY DETECT 1X3 (DISPOSABLE) IMPLANT
SPONGE SURGIFOAM ABS GEL 100 (HEMOSTASIS) ×2 IMPLANT
STRIP CLOSURE SKIN 1/2X4 (GAUZE/BANDAGES/DRESSINGS) ×2 IMPLANT
SUT VIC AB 1 CT1 18XBRD ANBCTR (SUTURE) ×2 IMPLANT
SUT VIC AB 1 CT1 8-18 (SUTURE) ×2
SUT VIC AB 2-0 CP2 18 (SUTURE) ×4 IMPLANT
SYR 20CC LL (SYRINGE) IMPLANT
SYR 20ML ECCENTRIC (SYRINGE) ×2 IMPLANT
SYR BULB IRRIGATION 50ML (SYRINGE) ×2 IMPLANT
TAPE CLOTH SURG 4X10 WHT LF (GAUZE/BANDAGES/DRESSINGS) ×2 IMPLANT
TOWEL OR 17X24 6PK STRL BLUE (TOWEL DISPOSABLE) ×2 IMPLANT
TOWEL OR 17X26 10 PK STRL BLUE (TOWEL DISPOSABLE) ×2 IMPLANT
TRAY FOLEY CATH 14FRSI W/METER (CATHETERS) ×2 IMPLANT
WATER STERILE IRR 1000ML POUR (IV SOLUTION) ×2 IMPLANT

## 2012-07-04 NOTE — Anesthesia Preprocedure Evaluation (Addendum)
Anesthesia Evaluation  Patient identified by MRN, date of birth, ID band Patient awake    Reviewed: Allergy & Precautions, H&P , NPO status , Patient's Chart, lab work & pertinent test results  Airway Mallampati: II TM Distance: >3 FB Neck ROM: Full    Dental No notable dental hx. (+) Teeth Intact and Dental Advisory Given   Pulmonary neg pulmonary ROS,  breath sounds clear to auscultation  Pulmonary exam normal       Cardiovascular hypertension, On Medications Rhythm:Regular Rate:Normal     Neuro/Psych negative neurological ROS  negative psych ROS   GI/Hepatic negative GI ROS, Neg liver ROS,   Endo/Other  diabetes, Type 2, Oral Hypoglycemic Agents  Renal/GU negative Renal ROS  negative genitourinary   Musculoskeletal   Abdominal   Peds  Hematology negative hematology ROS (+)   Anesthesia Other Findings   Reproductive/Obstetrics negative OB ROS                          Anesthesia Physical Anesthesia Plan  ASA: III  Anesthesia Plan: General   Post-op Pain Management:    Induction: Intravenous  Airway Management Planned: Oral ETT  Additional Equipment:   Intra-op Plan:   Post-operative Plan: Extubation in OR  Informed Consent: I have reviewed the patients History and Physical, chart, labs and discussed the procedure including the risks, benefits and alternatives for the proposed anesthesia with the patient or authorized representative who has indicated his/her understanding and acceptance.   Dental advisory given  Plan Discussed with: CRNA  Anesthesia Plan Comments:         Anesthesia Quick Evaluation

## 2012-07-04 NOTE — Anesthesia Procedure Notes (Signed)
Procedure Name: Intubation Date/Time: 07/04/2012 9:58 AM Performed by: Quentin Ore Pre-anesthesia Checklist: Patient identified, Emergency Drugs available, Suction available, Patient being monitored and Timeout performed Patient Re-evaluated:Patient Re-evaluated prior to inductionOxygen Delivery Method: Circle system utilized Preoxygenation: Pre-oxygenation with 100% oxygen Intubation Type: IV induction Ventilation: Mask ventilation without difficulty and Oral airway inserted - appropriate to patient size Laryngoscope Size: Mac and 4 Grade View: Grade II Tube type: Oral Tube size: 7.5 mm Number of attempts: 1 Airway Equipment and Method: Stylet Placement Confirmation: ETT inserted through vocal cords under direct vision and breath sounds checked- equal and bilateral Secured at: 21 cm Tube secured with: Tape Dental Injury: Teeth and Oropharynx as per pre-operative assessment

## 2012-07-04 NOTE — Progress Notes (Signed)
Instructed on using PCA

## 2012-07-04 NOTE — Preoperative (Signed)
Beta Blockers   Reason not to administer Beta Blockers:Not Applicable 

## 2012-07-04 NOTE — Progress Notes (Signed)
Orthopedic Tech Progress Note Patient Details:  [name] Mathews [birth year] [id number]  Patient ID: [name], female   DOB: [birth year], 78 y.o.   MRN: [id number]   Shawnie Pons 07/04/2012, 6:50 PM CALLED BIO TECH FOR LUMBAR FUSION BRACE

## 2012-07-04 NOTE — Op Note (Signed)
Brief history: The patient is a 78 year old black female who I performed a decompressive laminectomy on many years ago. She has done well until recently when she developed increasing back buttocks and leg pain consistent with neurogenic claudication. She has failed medical management and was worked up with a lumbar myelo CT. This demonstrated the patient had a spondylolisthesis and spinal stenosis at multiple levels but most prominent at L3-4 and L4-5. I discussed the various treatment options with the patient and her family. The patient has weighed the risks, benefits, and alternatives surgery decided proceed with a redo laminectomy decompression and fusion at L3-4 and L4-5.  Preoperative diagnosis: L3-4 and L4-5 spondylolisthesis, Degenerative disc disease, spinal stenosis; lumbago; lumbar radiculopathy  Postoperative diagnosis: L3-4 and L4-5 spondylolisthesis, Degenerative disc disease, spinal stenosis; lumbago; lumbar radiculopathy  Procedure: Redo laminectomy at L3-4 and L4-5 to decompress the bilateral L3, L4 and L5 nerve roots(the work required to do this was in addition to the work required to do the posterior lumbar interbody fusion because of the patient's spinal stenosis, facet arthropathy. Etc. requiring a wide decompression of the nerve roots.); L3-4 and L4-5 posterior lumbar interbody fusion with local morselized autograft bone and Actifusebone graft extender; insertion of interbody prosthesis at L3-4 and L4-5 (globus peek interbody prosthesis); posterior segmental instrumentation from L3 to L5 with globus titanium pedicle screws and rods; posterior lateral arthrodesis at L3-4 and L4-5 with local morselized autograft bone and Vitoss bone graft extender.  Surgeon: Dr. Delma Officer  Asst.: Dr. Hilda Lias  Anesthesia: Gen. endotracheal  Estimated blood loss: 300 cc  Drains: None  Locations: None  Description of procedure: The patient was brought to the operating room by the  anesthesia team. General endotracheal anesthesia was induced. The patient was turned to the prone position on the Wilson frame. The patient's lumbosacral region was then prepared with Betadine scrub and Betadine solution. Sterile drapes were applied.  I then injected the area to be incised with Marcaine with epinephrine solution. I then used the scalpel to make a linear midline incision over the L3-4 and L4-5 interspace, incising through the previous surgical scar.. I then used electrocautery to perform a bilateral subperiosteal dissection exposing the remainder of the lamina and the facets of L3-4 and L4-5. We then obtained intraoperative radiograph to confirm our location. We then inserted the Verstrac retractor to provide exposure.  I began the decompression by using the high speed drill to perform widen the previous laminectomy at L3-4 and L4-5. I drilled until I encountered relatively non-scarred dura.. We then used the Kerrison punches to widen the laminectomy and removed the scar tissue.. We used the Kerrison punches to remove the medial facets at L3-4 and L4-5. We performed wide foraminotomies about the bilateral L3, L4 and L5 nerve roots completing the decompression.  We now turned our attention to the posterior lumbar interbody fusion. I used a scalpel to incise the intervertebral disc at L3-4 and L4-5. I then performed a partial intervertebral discectomy at L3-4 and L4-5 using the pituitary forceps. We prepared the vertebral endplates at L3-4 and L4-5 for the fusion by removing the soft tissues with the curettes. We then used the trial spacers to pick the appropriate sized interbody prosthesis. We prefilled his prosthesis with a combination of local morselized autograft bone that we obtained during the decompression as well as Actifuse bone graft extender. We inserted the prefilled prosthesis into the interspace at L3-4 and L4-5. There was a good snug fit of the prosthesis in the  interspace. We then  filled and the remainder of the intervertebral disc space with local morselized autograft bone and Actifuse. This completed the posterior lumbar interbody arthrodesis.  We now turned attention to the instrumentation. Under fluoroscopic guidance we cannulated the bilateral L3, L4 and L5 pedicles with the bone probe. We then removed the bone probe. He then tapped the pedicle with a 6.5 millimeter tap. We then removed the tap. We probed inside the tapped pedicle with a ball probe to rule out cortical breaches. We then inserted a 7.5 x 50 millimeter pedicle screw into the L3, L4 and L5 pedicles bilaterally under fluoroscopic guidance. We then palpated along the medial aspect of the pedicles to rule out cortical breaches. There were none. The nerve roots were not injured. We then connected the unilateral pedicle screws with a lordotic rod. We compressed the construct and secured the rod in place with the caps. We then tightened the caps appropriately. This completed the instrumentation from L3-L5.  We now turned our attention to the posterior lateral arthrodesis at L3-4 and L4-5. We used the high-speed drill to decorticate the remainder of the facets, pars, transverse process at L3-4 and L4-5. We then applied a combination of local morselized autograft bone and Vitoss bone graft extender over these decorticated posterior lateral structures. This completed the posterior lateral arthrodesis.  We then obtained hemostasis using bipolar electrocautery. We irrigated the wound out with bacitracin solution. We inspected the thecal sac and nerve roots and noted they were well decompressed. We then removed the retractor. We reapproximated patient's thoracolumbar fascia with interrupted #1 Vicryl suture. We reapproximated patient's subcutaneous tissue with interrupted 2-0 Vicryl suture. The reapproximated patient's skin with Steri-Strips and benzoin. The wound was then coated with bacitracin ointment. A sterile dressing was  applied. The drapes were removed. The patient was subsequently returned to the supine position where they were extubated by the anesthesia team. He was then transported to the post anesthesia care unit in stable condition. All sponge instrument and needle counts were correct at the end of this case.

## 2012-07-04 NOTE — Transfer of Care (Signed)
Immediate Anesthesia Transfer of Care Note  Patient: [name] Mathews  Procedure(s) Performed: Procedure(s) (LRB) with comments: POSTERIOR LUMBAR FUSION 2 LEVEL (N/A) - Lumbar three-four and Lumbar four-five redo laminectomies with posterior lumbar interbody fusion with interbody prothesis posterolateral arthrodesis and posterior segmental instrumentation  Patient Location: PACU  Anesthesia Type: General  Level of Consciousness: alert   Airway & Oxygen Therapy: Patient Spontanous Breathing and Patient connected to nasal cannula oxygen  Post-op Assessment: Report given to PACU RN, Post -op Vital signs reviewed and stable and Patient moving all extremities  Post vital signs: Reviewed and stable  Complications: No apparent anesthesia complications

## 2012-07-04 NOTE — H&P (Signed)
Subjective: The patient is a 78 year old black female who I performed a decompressive lumbar laminectomy on in 1994. The patient initially had good relief of her neurogenic claudication symptoms. More recently the patient has developed recurrent symptoms consistent with neurogenic claudication. She failed medical management and was worked up with a lumbar myelo CT. this demonstrated multilevel degenerative changes, stenosis and spondylolisthesis. The most significant of which was at L3-4 and L4-5. I discussed the various treatment options with the patient and her children. The patient has weighed the risks, benefits, and alternatives surgery and decided proceed with a redo L34 and 45 laminectomy with instrumentation and fusion. Past Medical History  Diagnosis Date  . Hyperlipidemia   . Anxiety   . Diabetes mellitus   . Arthritis   . Anemia     hx of  . Cataract     left eye  . Chronic back pain   . Hypertension     sees Dr. Margret Chance, [facility] internal medicine  . Peripheral vascular disease   . Breast lump     hx of    Past Surgical History  Procedure Date  . Appendectomy   . Breast surgery     cyst removed left side  . Tonsillectomy   . Dilation and curettage of uterus   . Back surgery   . Colonoscopy   . Cardiovascular stress test     Allergies  Allergen Reactions  . Januvia (Sitagliptin) Other (See Comments)    Numbness in mouth  . Simvastatin Other (See Comments)    tremors    History  Substance Use Topics  . Smoking status: Never Smoker   . Smokeless tobacco: Not on file  . Alcohol Use: No    No family history on file. Prior to Admission medications   Medication Sig Start Date End Date Taking? Authorizing Provider  amLODipine (NORVASC) 10 MG tablet Take 10 mg by mouth daily.   Yes Historical Provider, MD  aspirin 81 MG tablet Take 81 mg by mouth daily.   Yes Historical Provider, MD  cetirizine (ZYRTEC) 10 MG tablet Take 10 mg by mouth daily.   Yes  Historical Provider, MD  Ferrous Gluconate (IRON) 240 (27 FE) MG TABS Take 1 tablet by mouth daily.   Yes Historical Provider, MD  ferrous sulfate 325 (65 FE) MG tablet Take 325 mg by mouth daily with breakfast.   Yes Historical Provider, MD  glyBURIDE-metformin (GLUCOVANCE) 5-500 MG per tablet Take 1 tablet by mouth 2 (two) times daily with a meal.   Yes Historical Provider, MD  LORazepam (ATIVAN) 0.5 MG tablet Take 0.5 mg by mouth daily as needed. For anxiety   Yes Historical Provider, MD  rosuvastatin (CRESTOR) 10 MG tablet Take 5 mg by mouth daily.   Yes Historical Provider, MD  telmisartan (MICARDIS) 40 MG tablet Take 40 mg by mouth daily.   Yes Historical Provider, MD  traMADol (ULTRAM) 50 MG tablet Take 50 mg by mouth every 6 (six) hours as needed.   Yes Historical Provider, MD  Vitamin D, Ergocalciferol, (DRISDOL) 50000 UNITS CAPS Take 50,000 Units by mouth every 14 (fourteen) days.   Yes Historical Provider, MD     Review of Systems  Positive ROS: As above  All other systems have been reviewed and were otherwise negative with the exception of those mentioned in the HPI and as above.  Objective: Vital signs in last 24 hours: Temp:  [98 F (36.7 C)-98.2 F (36.8 C)] 98 F (36.7 C) (09/04  0745) Pulse Rate:  [80] 80  (09/04 0745) Resp:  [20] 20  (09/04 0745) BP: (130-150)/(82-84) 150/84 mmHg (09/04 0745) SpO2:  [96 %-99 %] 99 % (09/04 0745) Weight:  [101.197 kg (223 lb 1.6 oz)] 101.197 kg (223 lb 1.6 oz) (09/03 1108)  General Appearance: Alert, cooperative, no distress, appears stated age Head: Normocephalic, without obvious abnormality, atraumatic Eyes: PERRL, conjunctiva/corneas clear, EOM's intact, fundi benign, both eyes      Ears: Normal TM's and external ear canals, both ears Throat: Lips, mucosa, and tongue normal; teeth and gums normal Neck: Supple, symmetrical, trachea midline, no adenopathy; thyroid: No enlargement/tenderness/nodules; no carotid bruit or JVD Back:  Symmetric, no curvature, ROM normal, no CVA tenderness the patient's lumbar incision is well-healed Lungs: Clear to auscultation bilaterally, respirations unlabored Heart: Regular rate and rhythm, S1 and S2 normal, no murmur, rub or gallop Abdomen: Soft, non-tender, bowel sounds active all four quadrants, no masses, no organomegaly Extremities: Extremities normal, atraumatic, no cyanosis or edema Pulses: 2+ and symmetric all extremities Skin: Skin color, texture, turgor normal, no rashes or lesions  NEUROLOGIC:   Mental status: alert and oriented, no aphasia, good attention span, Fund of knowledge/ memory ok Motor Exam - grossly normal Sensory Exam - grossly normal Reflexes:  Coordination - grossly normal Gait - grossly normal Balance - grossly normal Cranial Nerves: I: smell Not tested  II: visual acuity  OS: Normal    OD: Normal   II: visual fields Full to confrontation  II: pupils Equal, round, reactive to light  III,VII: ptosis None  III,IV,VI: extraocular muscles  Full ROM  V: mastication Normal  V: facial light touch sensation  Normal  V,VII: corneal reflex  Present  VII: facial muscle function - upper  Normal  VII: facial muscle function - lower Normal  VIII: hearing Not tested  IX: soft palate elevation  Normal  IX,X: gag reflex Present  XI: trapezius strength  5/5  XI: sternocleidomastoid strength 5/5  XI: neck flexion strength  5/5  XII: tongue strength  Normal    Data Review Lab Results  Component Value Date   WBC 6.8 07/03/2012   HGB 11.7* 07/03/2012   HCT 36.0 07/03/2012   MCV 84.5 07/03/2012   PLT 227 07/03/2012   Lab Results  Component Value Date   NA 137 07/03/2012   K 4.6 07/03/2012   CL 99 07/03/2012   CO2 26 07/03/2012   BUN 19 07/03/2012   CREATININE 1.23* 07/03/2012   GLUCOSE 165* 07/03/2012   No results found for this basename: INR, PROTIME    Assessment/Plan: L3-4 and L4-5 disc degeneration, spondylosis eases, spinal stenosis, neurogenic claudication,  lumbago: I discussed the situation with the patient and her family. I have reviewed the myelo CT with them and pointed out the abnormalities. We have discussed the various treatment options including surgery. I have described the surgical option of a redo L3-4 and L4-5 laminectomy, instrumentation, and fusion. I've shown him surgical models. We have discussed the risks, benefits, alternatives and likelihood of achieving our goals with surgery. I've answered all the patient's and her family's questions. The patient like to proceed with the operation.   Adelaida Reindel D 07/04/2012 9:23 AM

## 2012-07-04 NOTE — Anesthesia Postprocedure Evaluation (Signed)
Anesthesia Post Note  Patient: [name] Mathews  Procedure(s) Performed: Procedure(s) (LRB): POSTERIOR LUMBAR FUSION 2 LEVEL (N/A)  Anesthesia type: General  Patient location: PACU  Post pain: Pain level controlled and Adequate analgesia  Post assessment: Post-op Vital signs reviewed, Patient's Cardiovascular Status Stable, Respiratory Function Stable, Patent Airway and Pain level controlled  Last Vitals:  Filed Vitals:   07/04/12 1730  BP:   Pulse: 88  Temp:   Resp: 15    Post vital signs: Reviewed and stable  Level of consciousness: awake, alert  and oriented  Complications: No apparent anesthesia complications

## 2012-07-05 ENCOUNTER — Inpatient Hospital Stay (HOSPITAL_COMMUNITY): Payer: Medicare Other

## 2012-07-05 ENCOUNTER — Encounter (HOSPITAL_COMMUNITY): Payer: Self-pay | Admitting: *Deleted

## 2012-07-05 DIAGNOSIS — IMO0002 Reserved for concepts with insufficient information to code with codable children: Secondary | ICD-10-CM

## 2012-07-05 DIAGNOSIS — Q762 Congenital spondylolisthesis: Secondary | ICD-10-CM

## 2012-07-05 LAB — CBC
HCT: 27.1 % — ABNORMAL LOW (ref 36.0–46.0)
MCHC: 32.1 g/dL (ref 30.0–36.0)
Platelets: 166 10*3/uL (ref 150–400)
RDW: 14.7 % (ref 11.5–15.5)
WBC: 9.6 10*3/uL (ref 4.0–10.5)

## 2012-07-05 LAB — URINALYSIS, ROUTINE W REFLEX MICROSCOPIC
Bilirubin Urine: NEGATIVE
Glucose, UA: NEGATIVE mg/dL
Hgb urine dipstick: NEGATIVE
Protein, ur: NEGATIVE mg/dL

## 2012-07-05 LAB — GLUCOSE, CAPILLARY
Glucose-Capillary: 203 mg/dL — ABNORMAL HIGH (ref 70–99)
Glucose-Capillary: 159 mg/dL — ABNORMAL HIGH (ref 70–99)

## 2012-07-05 LAB — BASIC METABOLIC PANEL
BUN: 19 mg/dL (ref 6–23)
Chloride: 98 mEq/L (ref 96–112)
GFR calc Af Amer: 44 mL/min — ABNORMAL LOW (ref >90)
GFR calc non Af Amer: 38 mL/min — ABNORMAL LOW (ref >90)
Potassium: 4.3 mEq/L (ref 3.5–5.1)
Sodium: 133 mEq/L — ABNORMAL LOW (ref 135–145)

## 2012-07-05 MED ORDER — MORPHINE SULFATE 2 MG/ML IJ SOLN: 2.0000 mg | INTRAMUSCULAR | Status: DC | PRN

## 2012-07-05 NOTE — Evaluation (Signed)
Occupational Therapy Evaluation Patient Details Name: [name] Mathews MRN: [id number] DOB: Nov 30, 1933 Today's Date: 07/05/2012 Time: 8657-8469 OT Time Calculation (min): 32 min  OT Assessment / Plan / Recommendation Clinical Impression  This 78 y.o. female admitted for lumbar fusion.  Pt. lethargic this date with poor arrousal.  PCA has been discontinued so hopefully, pt. will be more alert tomorrow and able to participate.  Pt. may benefit from post acute rehab depending on progress.  Family is very supportive and the plan is that pt. will discharge home with pt.  She will benefit from continued OT to maximize safety and independence with BADLs to allow her to achieve min A - supervision level.    OT Assessment  Patient needs continued OT Services    Follow Up Recommendations  Inpatient Rehab;Supervision/Assistance - 24 hour    Barriers to Discharge None    Equipment Recommendations  Rolling walker with 5" wheels    Recommendations for Other Services Rehab consult  Frequency  Min 2X/week    Precautions / Restrictions Precautions Precautions: Back Precaution Comments: Pt unable to recall any back precautions.  She was reinstructed in precautions, bur unable to recite them Required Braces or Orthoses: Spinal Brace Spinal Brace: Lumbar corset Restrictions Weight Bearing Restrictions: No       ADL  Eating/Feeding: Simulated;Moderate assistance Where Assessed - Eating/Feeding: Chair Grooming: Simulated;Wash/dry face;Wash/dry hands;Maximal assistance Where Assessed - Grooming: Supported sitting Upper Body Bathing: Simulated;+1 Total assistance Where Assessed - Upper Body Bathing: Supported sitting Lower Body Bathing: Simulated;+1 Total assistance Where Assessed - Lower Body Bathing: Supported sit to stand Upper Body Dressing: Simulated;+1 Total assistance Where Assessed - Upper Body Dressing: Supported sitting Lower Body Dressing: Simulated;+1 Total assistance Where Assessed -  Lower Body Dressing: Supported sit to Pharmacist, hospital: Chief of Staff: Patient Percentage: 50% Statistician Method: Stand pivot Equipment Used: Back brace;Rolling walker Transfers/Ambulation Related to ADLs: Mobility limited by lethargy.  Pt. requires total A +2 (pt ~40% ) and increased time to move sit to stand, and ~50% to transfer to chair.  Dtrs. assisting therapist ADL Comments: Pt. unable to maintain adequate arrousal to participate in BADLs this date and therefore, is max -total A.  Dtrs are very supportive    OT Diagnosis: Generalized weakness;Cognitive deficits  OT Problem List: Decreased strength;Decreased activity tolerance;Decreased cognition;Decreased safety awareness;Decreased knowledge of use of DME or AE;Decreased knowledge of precautions OT Treatment Interventions: Self-care/ADL training;DME and/or AE instruction;Therapeutic activities;Patient/family education   OT Goals Acute Rehab OT Goals OT Goal Formulation: With patient/family Time For Goal Achievement: 07/12/12 Potential to Achieve Goals: Good ADL Goals Pt Will Perform Grooming: with min assist;Standing at sink ADL Goal: Grooming - Progress: Goal set today Pt Will Perform Upper Body Bathing: with supervision;Sitting, chair ADL Goal: Upper Body Bathing - Progress: Goal set today Pt Will Perform Lower Body Bathing: with min assist;Sit to stand from chair;Sit to stand from bed ADL Goal: Lower Body Bathing - Progress: Goal set today Pt Will Perform Upper Body Dressing: with supervision;Sitting, bed;Sitting, chair ADL Goal: Upper Body Dressing - Progress: Goal set today Pt Will Perform Lower Body Dressing: with min assist;Sit to stand from bed;Sit to stand from chair ADL Goal: Lower Body Dressing - Progress: Goal set today Pt Will Transfer to Toilet: with min assist;Ambulation;3-in-1 ADL Goal: Toilet Transfer - Progress: Goal set today Pt Will Perform Toileting - Clothing  Manipulation: with min assist;Standing ADL Goal: Toileting - Clothing Manipulation - Progress: Goal set today Pt Will  Perform Toileting - Hygiene: with supervision;Sit to stand from 3-in-1/toilet ADL Goal: Toileting - Hygiene - Progress: Goal set today Pt Will Perform Tub/Shower Transfer: with min assist;Ambulation;Shower transfer ADL Goal: Web designer - Progress: Goal set today Additional ADL Goal #1: Pt/dtr will be independent with donning/doffing brace ADL Goal: Additional Goal #1 - Progress: Goal set today  Visit Information  Last OT Received On: 07/05/12 Assistance Needed: +2    Subjective Data  Subjective: "She's my daughter" Patient Stated Goal: Pt unable due to lethargy   Prior Functioning  Vision/Perception  Home Living Lives With: Alone;Daughter (lives alone but staying with daughter post surgery) Available Help at Discharge: Family;Available 24 hours/day Type of Home: House Home Access: Level entry Home Layout: Two level;Able to live on main level with bedroom/bathroom Bathroom Shower/Tub: Health visitor: Standard Home Adaptive Equipment: Straight cane;Grab bars in shower;Built-in shower seat;Bedside commode/3-in-1 Additional Comments: Most information was gathered from pt's daughter due to pt's low level of arousal. This information is for pt's daughter's home, which is where pt will be going after surgery. Prior Function Level of Independence: Independent Able to Take Stairs?: Yes Driving: Yes Vocation: Retired Comments: Pt. prefers tub baths and sponge baths to showering Communication Communication: No difficulties Dominant Hand: Right      Cognition  Overall Cognitive Status: Impaired Area of Impairment: Attention;Memory;Following commands Arousal/Alertness: Lethargic Orientation Level: Disoriented to;Time Behavior During Session: Lethargic Current Attention Level: Focused (due to lethargy) Memory: Decreased recall of  precautions Memory Deficits: Likely due to lethargy Following Commands: Follows one step commands inconsistently (likely due to lethargy) Cognition - Other Comments: Pt required constant verbal cueing for arousal level.    Extremity/Trunk Assessment Right Upper Extremity Assessment RUE ROM/Strength/Tone: WFL for tasks assessed Left Upper Extremity Assessment LUE ROM/Strength/Tone: WFL for tasks assessed Right Lower Extremity Assessment RLE ROM/Strength/Tone: [facility] for tasks assessed Left Lower Extremity Assessment LLE ROM/Strength/Tone: WFL for tasks assessed   Mobility  Shoulder Instructions  Bed Mobility Bed Mobility: Sit to Sidelying Right;Scooting to HOB Rolling Right: 3: Mod assist;With rail Right Sidelying to Sit: 2: Max assist;With rails;HOB flat Sit to Sidelying Right: 2: Max assist;HOB flat Scooting to HOB: 1: +2 Total assist Scooting to [facility]: Patient Percentage: 10% Details for Bed Mobility Assistance: Pt. requires max verbal and tactile cues due to lethargy Transfers Transfers: Sit to Stand;Stand to Sit Sit to Stand: 1: +2 Total assist;With upper extremity assist;From chair/3-in-1 Sit to Stand: Patient Percentage: 40% Stand to Sit: 1: +2 Total assist;To bed;With upper extremity assist Stand to Sit: Patient Percentage: 50% Details for Transfer Assistance: Pt. requires max verbal and tactile cues for hand positioning, to turn walker and sequence all movments due to lethargy       Exercise     Balance Balance Balance Assessed: Yes Static Sitting Balance Static Sitting - Balance Support: Bilateral upper extremity supported;Feet supported Static Sitting - Level of Assistance: 2: Max assist;4: Min assist Static Sitting - Comment/# of Minutes: Pt sitting EOB to attempt to increase level of arousal for PLOF questioning and tended to have LOB posteriorly, requiring maximum assist to corrrect LOB.   End of Session OT - End of Session Equipment Utilized During Treatment: Back  brace Activity Tolerance: Patient limited by fatigue Patient left: in bed;with call bell/phone within reach;with family/visitor present  GO     Neylan Koroma, Ursula Alert M 07/05/2012, 1:35 PM

## 2012-07-05 NOTE — Progress Notes (Signed)
Orthopedic Tech Progress Note Patient Details:  [name] Mathews [birth year] [id number]  Patient ID: [name], female   DOB: 08-13-1934, 78 y.o.   MRN: [id number] Brace order completed by Storm Frisk, Varnika Butz 07/05/2012, 5:00 PM

## 2012-07-05 NOTE — Progress Notes (Signed)
Patient ID: [name] Mathews, female   DOB: 1934/03/02, 78 y.o.   MRN: [id number] Subjective:  The patient is mildly somnolent but easily arousable. Her back is appropriately sore. I spoke with the patient's daughter.  Objective: Vital signs in last 24 hours: Temp:  [97.4 F (36.3 C)-101 F (38.3 C)] 101 F (38.3 C) (09/05 0720) Pulse Rate:  [84-110] 110  (09/05 0600) Resp:  [11-22] 18  (09/05 0800) BP: (101-126)/(41-62) 126/60 mmHg (09/05 0600) SpO2:  [94 %-99 %] 94 % (09/05 0800) Weight:  [101.152 kg (223 lb)] 101.152 kg (223 lb) (09/04 1745)  Intake/Output from previous day: 09/04 0701 - 09/05 0700 In: 3950 [I.V.:3950] Out: 555 [Urine:205; Blood:350] Intake/Output this shift:    Physical exam the patient is alert and oriented. Her strength is grossly normal in her lower extremities.  Lab Results:  Basename 07/05/12 0636 07/03/12 1216  WBC 9.6 6.8  HGB 8.7* 11.7*  HCT 27.1* 36.0  PLT 166 227   BMET  Basename 07/05/12 0636 07/03/12 1216  NA 133* 137  K 4.3 4.6  CL 98 99  CO2 24 26  GLUCOSE 161* 165*  BUN 19 19  CREATININE 1.32* 1.23*  CALCIUM 8.1* 9.6    Studies/Results: Dg Lumbar Spine 2-3 Views  07/04/2012  *RADIOLOGY REPORT*  Clinical Data: L3-L5 PLIF  LUMBAR SPINE - 2-3 VIEW  Comparison: Earlier same day  Findings: Two C-arm images show placement of pedicle screws bilaterally at L3, L4 and L5.  There has been discectomy with placement of interbody fusion material, grossly well positioned.  IMPRESSION: PLIF in progress L3-L5.   Original Report Authenticated By: Thomasenia Sales, M.D.    Dg Lumbar Spine 2-3 Views  07/04/2012  *RADIOLOGY REPORT*  Clinical Data: L3-4 and L4-5 PLIF  LUMBAR SPINE - 2-3 VIEW  Comparison: 03/29/2012  Findings: Initial lateral view shows a probe at the pedicle level of L4.  Second film shows tissue spreaders posteriorly with a probe posterior to the L3-4 disc level.  Third film confirms the probe posterior to the L3-4 disc level.  IMPRESSION:  L3-4 disc level localized.   Original Report Authenticated By: Thomasenia Sales, M.D.    Dg Chest Port 1 View  07/05/2012  *RADIOLOGY REPORT*  Clinical Data: Postoperative fever.  PORTABLE CHEST - 1 VIEW  Comparison: None.  Findings: Study is limited by portable technique and body habitus. No consolidative process, pneumothorax or effusion is identified. Heart size is upper normal.  IMPRESSION: No acute finding.   Original Report Authenticated By: Bernadene Bell. Maricela Curet, M.D.     Assessment/Plan: Postop day #1: We will mobilize the patient with PT and OT. I will DC her PCA.  Low-grade fevers: Her chest x-ray is unremarkable. We are awaiting the results of urinalysis and culture as well as blood cultures. But this is likely secondary to atelectasis.  Acute blood loss anemia: This is likely exacerbated by hemodilution. I'll check a CBC tomorrow  Hyponatremia/Increased creatinine: We will check a BMP tomorrow.  LOS: 1 day     [name] D 07/05/2012, 9:46 AM

## 2012-07-05 NOTE — Progress Notes (Signed)
Nutrition Brief Note  Patient identified on the Malnutrition Screening Tool (MST) report for weight loss, generating a score of 2.  I spoke with the patient and her daughter whom both deny any recent weight changes. They also report good appetite.   Body mass index is 33.91 kg/(m^2). Pt meets criteria for Obesity Class I based on current BMI.   Current diet order is CHO Modified Medium, patient is consuming approximately 100% of meals at this time. Labs and medications reviewed.   No nutrition interventions warranted at this time. If nutrition issues arise, please consult RD.   Kendell Bane RD, LDN, CNSC [phone] Pager [phone] After Hours Pager

## 2012-07-05 NOTE — Evaluation (Signed)
I have read and agree with assessment and plan. Fadumo Heng Helen Whitlow PT, DPT Pager: [phone] 

## 2012-07-05 NOTE — Progress Notes (Signed)
Patient temperature reassessed after one hour of giving tylenol. Temperature had risen to 100.8. Patient was uncovered and given cool packs and cool cloths to decrease temperature. Will assess again before shift change.

## 2012-07-05 NOTE — Progress Notes (Signed)
Patient's daughter came to see RN to say patient was "not acting right" and was "out of it" shortly after using PCA. She asked for her BP to be taken. BP was 116/58. O2 98%. CBG 156. Patient did seem to be less responsive, but was still A+Ox3, just slow to respond. Patient did have a temperature of 100.1. Tylenol was given and patient and daughter educated on use of IS. IS reached 750. Patient encouraged to take deep breaths. Per daughter's request, patient's PCA button was removed, and patient was told to call for pain so that oral analgesia could be given. Will cont to monitor.

## 2012-07-05 NOTE — Progress Notes (Signed)
UR COMPLETED  

## 2012-07-05 NOTE — Progress Notes (Signed)
Inpatient Diabetes Program Recommendations  AACE/ADA: New Consensus Statement on Inpatient Glycemic Control  Target Ranges:  Prepandial:   less than 140 mg/dL      Peak postprandial:   less than 180 mg/dL (1-2 hours)      Critically ill patients:  140 - 180 mg/dL  Pager:  [phone] Hours:  8 am-10pm   Reason for Visit: Elevated prandial glucose  Inpatient Diabetes Program Recommendations Correction (SSI): Change to ACHS Insulin - Meal Coverage: Add Novolog 3 units TID for meal coverage  Note: May also benefit from low dose Lantus while inpatient and not taking oral agents.  Alfredia Client PhD, RN, BC-ADM Diabetes Coordinator  Office:  [phone] Team Pager:  [phone]

## 2012-07-05 NOTE — Consult Note (Signed)
Physical Medicine and Rehabilitation Consult Reason for Consult: Lumbar L3-4 and L4-5 spondylolisthesis with radiculopathy Referring Physician: Dr. Lovell Sheehan   HPI: [name] Mathews is a 78 y.o. right-handed female with history of decompressive lumbar laminectomy in 1994. Admitted 07/04/2012 with progressive low back pain radiating to the lower extremity. She denied any bowel or bladder disturbances. Lumbar myelogram demonstrated multilevel degenerative changes, stenosis spondylolisthesis with radiculopathy. Underwent redo laminectomy at lumbar L3-4 and L4-5 to decompress the bilateral L3, L4 and L5 nerve roots with posterior lumbar interbody fusion 07/04/2012 per Dr. Lovell Sheehan. Fitted with aspen lumbar fusion brace. Postoperative pain control. Sequential compression devices were added for DVT prophylaxis. Physical and occupational therapy evaluations are pending. M.D. is requested physical medicine rehabilitation consult to consider inpatient rehabilitation services   Review of Systems  Gastrointestinal: Positive for constipation.  Musculoskeletal: Positive for myalgias and back pain.  Neurological: Positive for weakness.  All other systems reviewed and are negative.   Past Medical History  Diagnosis Date  . Hyperlipidemia   . Anxiety   . Diabetes mellitus   . Arthritis   . Anemia     hx of  . Cataract     left eye  . Chronic back pain   . Hypertension     sees Dr. Margret Chance, [facility] internal medicine  . Peripheral vascular disease   . Breast lump     hx of   Past Surgical History  Procedure Date  . Appendectomy   . Breast surgery     cyst removed left side  . Tonsillectomy   . Dilation and curettage of uterus   . Back surgery   . Colonoscopy   . Cardiovascular stress test    History reviewed. No pertinent family history. Social History:  reports that she has never smoked. She does not have any smokeless tobacco history on file. She reports that she does not drink  alcohol or use illicit drugs. Allergies:  Allergies  Allergen Reactions  . Januvia (Sitagliptin) Other (See Comments)    Numbness in mouth  . Simvastatin Other (See Comments)    tremors   Medications Prior to Admission  Medication Sig Dispense Refill  . amLODipine (NORVASC) 10 MG tablet Take 10 mg by mouth daily.      Marland Kitchen aspirin 81 MG tablet Take 81 mg by mouth daily.      . cetirizine (ZYRTEC) 10 MG tablet Take 10 mg by mouth daily.      . Ferrous Gluconate (IRON) 240 (27 FE) MG TABS Take 1 tablet by mouth daily.      . ferrous sulfate 325 (65 FE) MG tablet Take 325 mg by mouth daily with breakfast.      . glyBURIDE-metformin (GLUCOVANCE) 5-500 MG per tablet Take 1 tablet by mouth 2 (two) times daily with a meal.      . LORazepam (ATIVAN) 0.5 MG tablet Take 0.5 mg by mouth daily as needed. For anxiety      . rosuvastatin (CRESTOR) 10 MG tablet Take 5 mg by mouth daily.      Marland Kitchen telmisartan (MICARDIS) 40 MG tablet Take 40 mg by mouth daily.      . traMADol (ULTRAM) 50 MG tablet Take 50 mg by mouth every 6 (six) hours as needed.      . Vitamin D, Ergocalciferol, (DRISDOL) 50000 UNITS CAPS Take 50,000 Units by mouth every 14 (fourteen) days.        Home:    Functional History:   Functional Status:  Mobility:          ADL:    Cognition: Cognition Orientation Level: Oriented to person;Oriented to place;Oriented to situation;Disoriented to time    Blood pressure 126/60, pulse 110, temperature 100.8 F (38.2 C), temperature source Oral, resp. rate 22, height 5\' 8"  (1.727 m), weight 101.152 kg (223 lb), SpO2 96.00%. Physical Exam  Vitals reviewed. Constitutional: She is oriented to person, place, and time. She appears well-developed.       Obese.   HENT:  Head: Normocephalic.  Eyes:       Pupils round and reactive to light  Neck: Neck supple. No thyromegaly present.  Cardiovascular: Normal rate, regular rhythm and normal heart sounds.   Pulmonary/Chest: Effort normal and  breath sounds normal. No respiratory distress. She has no wheezes.       Occasional cough, rhonchi  Abdominal: Bowel sounds are normal. She exhibits no distension. There is no tenderness.  Musculoskeletal: She exhibits no edema.  Neurological: She is alert and oriented to person, place, and time.       Follows commands but slow to arouse. Moves all 4's but inconsistent. No gross sensory deficits. dtr's grossly 1+.   Skin:       Back incision is dressed. Brace at bedside  Psychiatric: She has a normal mood and affect. Her behavior is normal.    Results for orders placed during the hospital encounter of 07/04/12 (from the past 24 hour(s))  GLUCOSE, CAPILLARY     Status: Abnormal   Collection Time   07/04/12  7:47 AM      Component Value Range   Glucose-Capillary 160 (*) 70 - 99 mg/dL  GLUCOSE, CAPILLARY     Status: Abnormal   Collection Time   07/04/12  3:54 PM      Component Value Range   Glucose-Capillary 265 (*) 70 - 99 mg/dL  GLUCOSE, CAPILLARY     Status: Abnormal   Collection Time   07/04/12  6:53 PM      Component Value Range   Glucose-Capillary 315 (*) 70 - 99 mg/dL  GLUCOSE, CAPILLARY     Status: Abnormal   Collection Time   07/04/12 11:59 PM      Component Value Range   Glucose-Capillary 191 (*) 70 - 99 mg/dL   Comment 1 Notify RN    GLUCOSE, CAPILLARY     Status: Abnormal   Collection Time   07/05/12  4:16 AM      Component Value Range   Glucose-Capillary 156 (*) 70 - 99 mg/dL   Comment 1 Notify RN     Dg Lumbar Spine 2-3 Views  07/04/2012  *RADIOLOGY REPORT*  Clinical Data: L3-L5 PLIF  LUMBAR SPINE - 2-3 VIEW  Comparison: Earlier same day  Findings: Two C-arm images show placement of pedicle screws bilaterally at L3, L4 and L5.  There has been discectomy with placement of interbody fusion material, grossly well positioned.  IMPRESSION: PLIF in progress L3-L5.   Original Report Authenticated By: Thomasenia Sales, M.D.    Dg Lumbar Spine 2-3 Views  07/04/2012  *RADIOLOGY REPORT*   Clinical Data: L3-4 and L4-5 PLIF  LUMBAR SPINE - 2-3 VIEW  Comparison: 03/29/2012  Findings: Initial lateral view shows a probe at the pedicle level of L4.  Second film shows tissue spreaders posteriorly with a probe posterior to the L3-4 disc level.  Third film confirms the probe posterior to the L3-4 disc level.  IMPRESSION: L3-4 disc level localized.   Original Report Authenticated  By: Thomasenia Sales, M.D.     Assessment/Plan: Diagnosis: lumbar spondylolisthesis, stenosis with radiculopathy 1. Does the need for close, 24 hr/day medical supervision in concert with the patient's rehab needs make it unreasonable for this patient to be served in a less intensive setting? Yes 2. Co-Morbidities requiring supervision/potential complications: dm, hytn, obesity, post-op fever 3. Due to bladder management, bowel management, safety, skin/wound care, disease management, medication administration, pain management and patient education, does the patient require 24 hr/day rehab nursing? Yes 4. Does the patient require coordinated care of a physician, rehab nurse, PT (1-2 hrs/day, 5 days/week) and OT (1-2 hrs/day, 5 days/week) to address physical and functional deficits in the context of the above medical diagnosis(es)? Yes Addressing deficits in the following areas: balance, endurance, locomotion, strength, transferring, bowel/bladder control, bathing, dressing, feeding, grooming, toileting and psychosocial support 5. Can the patient actively participate in an intensive therapy program of at least 3 hrs of therapy per day at least 5 days per week? Potentially 6. The potential for patient to make measurable gains while on inpatient rehab is excellent 7. Anticipated functional outcomes upon discharge from inpatient rehab are mod I to supervision with PT, mod I to min assist with OT, n/a with SLP. 8. Estimated rehab length of stay to reach the above functional goals is: potentially 1-2 weeks 9. Does the patient  have adequate social supports to accommodate these discharge functional goals? Yes 10. Anticipated D/C setting: Home 11. Anticipated post D/C treatments: HH therapy 12. Overall Rehab/Functional Prognosis: excellent  RECOMMENDATIONS: This patient's condition is appropriate for continued rehabilitative care in the following setting: [facility] Patient has agreed to participate in recommended program. Yes and Potentially Note that insurance prior authorization may be required for reimbursement for recommended care.  Comment: Will follow along for medical stability and therapy assessments. Spoke with daughter at length this am.  Ivory Broad, MD    07/05/2012

## 2012-07-05 NOTE — Anesthesia Postprocedure Evaluation (Signed)
  Anesthesia Post-op Note  Patient: JALENE DEMO  Procedure(s) Performed: Procedure(s) (LRB) with comments: POSTERIOR LUMBAR FUSION 2 LEVEL (N/A) - Lumbar three-four and Lumbar four-five redo laminectomies with posterior lumbar interbody fusion with interbody prothesis posterolateral arthrodesis and posterior segmental instrumentation  Patient Location: PACU  Anesthesia Type: General  Level of Consciousness: awake and alert   Airway and Oxygen Therapy: Patient Spontanous Breathing  Post-op Pain: mild  Post-op Assessment: Post-op Vital signs reviewed, Patient's Cardiovascular Status Stable, Respiratory Function Stable, Patent Airway, No signs of Nausea or vomiting and Pain level controlled  Post-op Vital Signs: stable  Complications: No apparent anesthesia complications

## 2012-07-05 NOTE — Evaluation (Signed)
Physical Therapy Evaluation Patient Details Name: [name] Mathews MRN: [id number] DOB: December 24, 1933 Today's Date: 07/05/2012 Time: 4098-1191 PT Time Calculation (min): 29 min  PT Assessment / Plan / Recommendation Clinical Impression  Pt is a 78 yo female s/p L3-4, 4-5 fusion post op day 1. Pt presented to physical therapy evaluation with decreased mobility secondary to surgery and pain and will benefit from skilled physical therapy to address theses deficits. Evaluation limited by pt's decresed arousal level, with pt requring maximum verbal cueing to remain awake and attend to task. Pt was unable to put on her own glasses with multiple attempts and verbal as well as tactile cueing, requiring her daughter to put them on for her. Recommending  [facility] at discharge.    PT Assessment  Patient needs continued PT services    Follow Up Recommendations  Inpatient Rehab    Barriers to Discharge        Equipment Recommendations  Rolling walker with 5" wheels    Recommendations for Other Services Rehab consult   Frequency Min 5X/week    Precautions / Restrictions Precautions Precautions: Back Precaution Comments: Pt educated on long rolling technique for bed mobility and back precautions. Required Braces or Orthoses: Spinal Brace Spinal Brace: Lumbar corset Restrictions Weight Bearing Restrictions: No         Mobility  Bed Mobility Bed Mobility: Rolling Right;Right Sidelying to Sit Rolling Right: 3: Mod assist;With rail Right Sidelying to Sit: 2: Max assist;With rails;HOB flat Details for Bed Mobility Assistance: Pt required constant verbal cueing for sequencing and maintaining back precautions. Pt required facilitation at hip and upper trunk for log rolling to side and max assist/facilitation at upper trunk to come to full upright sitting EOB. Transfers Transfers: Editor, commissioning Transfers: 1: +2 Total assist Stand Pivot Transfers: Patient Percentage: 60% Details for  Transfer Assistance: Pt requried maximum verbal cueing for positioning of feet and weight shift forward to begin to initiate sit to stand for transfer and facilitation at sacrum and upper trunk to come to full upright stance, as well as for safe hand placement. During transfer, pt requried maximum verbal cues for sequencing  and foot palcement. Pt requried verbal  and tactile cues for safe hand placement prior to stand->sit and assist to conrtol descent. Ambulation/Gait Ambulation/Gait Assistance: Not tested (comment) Stairs: No    Exercises     PT Diagnosis: Difficulty walking;Abnormality of gait;Acute pain  PT Problem List: Decreased activity tolerance;Decreased balance;Decreased mobility;Decreased knowledge of use of DME;Decreased knowledge of precautions PT Treatment Interventions: DME instruction;Gait training;Stair training;Functional mobility training;Therapeutic activities;Therapeutic exercise;Balance training;Neuromuscular re-education;Patient/family education   PT Goals Acute Rehab PT Goals PT Goal Formulation: With patient Time For Goal Achievement: 07/12/12 Potential to Achieve Goals: Good Pt will Roll Supine to Right Side: with modified independence PT Goal: Rolling Supine to Right Side - Progress: Goal set today Pt will Roll Supine to Left Side: with modified independence PT Goal: Rolling Supine to Left Side - Progress: Goal set today Pt will go Supine/Side to Sit: with modified independence PT Goal: Supine/Side to Sit - Progress: Goal set today Pt will go Sit to Stand: with supervision PT Goal: Sit to Stand - Progress: Goal set today Pt will go Stand to Sit: with supervision PT Goal: Stand to Sit - Progress: Goal set today Pt will Ambulate: >150 feet;with supervision;with least restrictive assistive device PT Goal: Ambulate - Progress: Goal set today Additional Goals Additional Goal #1: Pt will be able to verbalize 3/3 back precautions  and demonstrate during all functional  mobility. PT Goal: Additional Goal #1 - Progress: Goal set today  Visit Information  Last PT Received On: 07/05/12 Assistance Needed: +2    Subjective Data  Subjective: "I am doing ok I guess."   Prior Functioning  Home Living Lives With: Alone;Daughter (lives alone but staying with daughter post surgery) Available Help at Discharge: Family;Available 24 hours/day Type of Home: House Home Access: Level entry Home Layout: Two level;Able to live on main level with bedroom/bathroom Bathroom Shower/Tub: Health visitor: Standard Home Adaptive Equipment: Straight cane;Grab bars in shower;Built-in shower seat Additional Comments: Most information was gathered from pt's daughter due to pt's low level of arousal. This information is for pt's daughter's home, which is where pt will be going after surgery. Prior Function Level of Independence: Independent Able to Take Stairs?: Yes Driving: Yes Communication Communication: No difficulties    Cognition  Overall Cognitive Status: Appears within functional limits for tasks assessed/performed Arousal/Alertness: Lethargic Orientation Level: Appears intact for tasks assessed Behavior During Session: Lethargic Cognition - Other Comments: Pt required constant verbal cueing for arousal level.    Extremity/Trunk Assessment Right Upper Extremity Assessment RUE ROM/Strength/Tone: WFL for tasks assessed Left Upper Extremity Assessment LUE ROM/Strength/Tone: WFL for tasks assessed Right Lower Extremity Assessment RLE ROM/Strength/Tone: WFL for tasks assessed Left Lower Extremity Assessment LLE ROM/Strength/Tone: WFL for tasks assessed   Balance Balance Balance Assessed: Yes Static Sitting Balance Static Sitting - Balance Support: Bilateral upper extremity supported;Feet supported Static Sitting - Level of Assistance: 2: Max assist;4: Min assist Static Sitting - Comment/# of Minutes: Pt sitting EOB to attempt to increase level of  arousal for PLOF questioning and tended to have LOB posteriorly, requiring maximum assist to corrrect LOB.  End of Session PT - End of Session Equipment Utilized During Treatment: Gait belt;Back brace Activity Tolerance: Patient limited by fatigue (decreased arousal level) Patient left: in chair;with call bell/phone within reach;with family/visitor present Nurse Communication: Mobility status  GP     Basia Mcginty 07/05/2012, 10:38 AM

## 2012-07-06 LAB — URINE CULTURE
Colony Count: NO GROWTH
Culture: NO GROWTH
Special Requests: NORMAL

## 2012-07-06 LAB — CBC
Hemoglobin: 8.4 g/dL — ABNORMAL LOW (ref 12.0–15.0)
MCV: 84.1 fL (ref 78.0–100.0)
Platelets: 143 10*3/uL — ABNORMAL LOW (ref 150–400)
RBC: 3.01 MIL/uL — ABNORMAL LOW (ref 3.87–5.11)
WBC: 11.8 10*3/uL — ABNORMAL HIGH (ref 4.0–10.5)

## 2012-07-06 LAB — BASIC METABOLIC PANEL
CO2: 25 mEq/L (ref 19–32)
Calcium: 8 mg/dL — ABNORMAL LOW (ref 8.4–10.5)
GFR calc non Af Amer: 34 mL/min — ABNORMAL LOW (ref >90)
Potassium: 3.8 mEq/L (ref 3.5–5.1)
Sodium: 131 mEq/L — ABNORMAL LOW (ref 135–145)

## 2012-07-06 LAB — GLUCOSE, CAPILLARY
Glucose-Capillary: 195 mg/dL — ABNORMAL HIGH (ref 70–99)
Glucose-Capillary: 180 mg/dL — ABNORMAL HIGH (ref 70–99)
Glucose-Capillary: 178 mg/dL — ABNORMAL HIGH (ref 70–99)
Glucose-Capillary: 235 mg/dL — ABNORMAL HIGH (ref 70–99)

## 2012-07-06 MED ORDER — FERROUS SULFATE 325 (65 FE) MG PO TABS
325.0000 mg | ORAL_TABLET | Freq: Three times a day (TID) | ORAL | Status: DC
  Administered 2012-07-06 – 2012-07-10 (×12): 325 mg via ORAL
  Filled 2012-07-06 (×14): qty 1

## 2012-07-06 MED ORDER — SODIUM CHLORIDE 0.9 % IV SOLN
INTRAVENOUS | Status: DC
  Administered 2012-07-06 – 2012-07-07 (×3): via INTRAVENOUS

## 2012-07-06 MED FILL — Sodium Chloride IV Soln 0.9%: INTRAVENOUS | Qty: 1000 | Status: AC

## 2012-07-06 MED FILL — Sodium Chloride Irrigation Soln 0.9%: Qty: 3000 | Status: AC

## 2012-07-06 MED FILL — Heparin Sodium (Porcine) Inj 1000 Unit/ML: INTRAMUSCULAR | Qty: 30 | Status: AC

## 2012-07-06 NOTE — Clinical Social Work Psychosocial (Signed)
     Clinical Social Work Department BRIEF PSYCHOSOCIAL ASSESSMENT 07/06/2012  Patient:  [name] Mathews, [name] Mathews     Account Number:  [id number]     Admit date:  07/04/2012  Clinical Social Worker:  Peggyann Shoals  Date/Time:  07/06/2012 03:29 PM  Referred by:  Physician  Date Referred:  07/06/2012 Referred for  SNF Placement   Other Referral:   Interview type:  Family Other interview type:    PSYCHOSOCIAL DATA Living Status:  ALONE Admitted from facility:   Level of care:   Primary support name:  Oretha Milch Primary support relationship to patient:  CHILD, ADULT Degree of support available:   Supportive    CURRENT CONCERNS Current Concerns  Post-Acute Placement   Other Concerns:    SOCIAL WORK ASSESSMENT / PLAN CSW met with pt and family to address consult for SNF. Currently PT is recommending CIR. CIR has evaluated pt for admission. CIR plans to admit pt on Monday. CSW introduced herself and explained role of social work. Pt shared that she would like to go to CIR for continued rehab.    CSW confimed with CIR that they plan to admit pt on Monday, 07/09/12. CSW will continue to follow during this admission. CSW will address SNF placement if discharge plan changes.   Assessment/plan status:  Psychosocial Support/Ongoing Assessment of Needs Other assessment/ plan:   Information/referral to community resources:   None at this time.    PATIENTS/FAMILYS RESPONSE TO PLAN OF CARE: Pt was very pleasant. Pt and family are agreeable to discharge plan to [facility].

## 2012-07-06 NOTE — Progress Notes (Signed)
Patient ID: [name] Mathews, female   DOB: 07-23-1934, 78 y.o.   MRN: [id number] Subjective:  The patient is mildly somnolent but easily arousable. She is pleasant. I spoke with her daughter.  Objective: Vital signs in last 24 hours: Temp:  [99.5 F (37.5 C)-102 F (38.9 C)] 99.8 F (37.7 C) (09/06 0600) Pulse Rate:  [99-110] 99  (09/06 0600) Resp:  [18] 18  (09/06 0600) BP: (107-134)/(50-65) 107/59 mmHg (09/06 0600) SpO2:  [94 %-98 %] 94 % (09/06 0600)  Intake/Output from previous day: 09/05 0701 - 09/06 0700 In: 440 [P.O.:440] Out: 3050 [Urine:3050] Intake/Output this shift:    Physical exam the patient is alert and oriented x2 (person in a hospital) her lower extremity strength is grossly normal.  Lab Results:  Basename 07/05/12 0636 07/03/12 1216  WBC 9.6 6.8  HGB 8.7* 11.7*  HCT 27.1* 36.0  PLT 166 227   BMET  Basename 07/05/12 0636 07/03/12 1216  NA 133* 137  K 4.3 4.6  CL 98 99  CO2 24 26  GLUCOSE 161* 165*  BUN 19 19  CREATININE 1.32* 1.23*  CALCIUM 8.1* 9.6    Studies/Results: Dg Lumbar Spine 2-3 Views  07/04/2012  *RADIOLOGY REPORT*  Clinical Data: L3-L5 PLIF  LUMBAR SPINE - 2-3 VIEW  Comparison: Earlier same day  Findings: Two C-arm images show placement of pedicle screws bilaterally at L3, L4 and L5.  There has been discectomy with placement of interbody fusion material, grossly well positioned.  IMPRESSION: PLIF in progress L3-L5.   Original Report Authenticated By: [name] Mathews, M.Mathews.    Dg Lumbar Spine 2-3 Views  07/04/2012  *RADIOLOGY REPORT*  Clinical Data: L3-4 and L4-5 PLIF  LUMBAR SPINE - 2-3 VIEW  Comparison: 03/29/2012  Findings: Initial lateral view shows a probe at the pedicle level of L4.  Second film shows tissue spreaders posteriorly with a probe posterior to the L3-4 disc level.  Third film confirms the probe posterior to the L3-4 disc level.  IMPRESSION: L3-4 disc level localized.   Original Report Authenticated By: [name] Mathews, M.Mathews.     Dg Chest Port 1 View  07/05/2012  *RADIOLOGY REPORT*  Clinical Data: Postoperative fever.  PORTABLE CHEST - 1 VIEW  Comparison: None.  Findings: Study is limited by portable technique and body habitus. No consolidative process, pneumothorax or effusion is identified. Heart size is upper normal.  IMPRESSION: No acute finding.   Original Report Authenticated By: [name] Mathews. [name] Mathews, M.Mathews.     Assessment/Plan: Postop day #2: We will mobilize the patient with PT and OT. She will likely need rehabilitation.  Hyponatremia: A BMP is pending.  Anemia: A CBC is pending.  Fevers: The patient's chest x-ray and urinalysis is okay. We are awaiting cultures.  Mild confusion: This is likely multifactorial. The patient's exam is nonfocal. I think this will likely improve with time.  LOS: 2 days     [name] Mathews 07/06/2012, 7:28 AM

## 2012-07-06 NOTE — Progress Notes (Signed)
Pt was not able to void after foley was taken out. At 0200, bladder scan done and it was . At 0500, pt still could not void, bladder scan done and it was . In and out cath done using sterile technique and was able to drain of clear yellow colored urine. Will continue to monitor pt.

## 2012-07-06 NOTE — Progress Notes (Signed)
Rehab admissions - Evaluated for possible admission.  I spoke with patient's daughter, Velna Hatchet.  Patient very sleepy during my visit.  Not medically ready for inpatient rehab today.  I will follow up on Monday.  If she does well over weekend, may potentially be able to admit to inpatient rehab on Monday.  Call me for questions.  #[phone]

## 2012-07-06 NOTE — Progress Notes (Signed)
Physical Therapy Treatment Patient Details Name: [name] Mathews MRN: [id number] DOB: 07/22/1934 Today's Date: 07/06/2012 Time: 1308-6578 PT Time Calculation (min): 41 min  PT Assessment / Plan / Recommendation Comments on Treatment Session  Pt requiring max verbal cues to maintain alertness during session.  Mobility limited by lethargy.      Follow Up Recommendations  Inpatient Rehab    Barriers to Discharge        Equipment Recommendations  Rolling walker with 5" wheels    Recommendations for Other Services Rehab consult  Frequency Min 5X/week   Plan Discharge plan remains appropriate;Frequency remains appropriate    Precautions / Restrictions Precautions Precautions: Back Precaution Comments: Pt unable to recall any back precautions.  She was reinstructed in precautions, bur unable to recite them Required Braces or Orthoses: Spinal Brace Spinal Brace: Lumbar corset;Applied in sitting position Restrictions Weight Bearing Restrictions: No   Pertinent Vitals/Pain 6/10 RN aware    Mobility  Bed Mobility Bed Mobility: Rolling Right;Right Sidelying to Sit Rolling Right: 3: Mod assist;With rail Right Sidelying to Sit: 1: +2 Total assist;With rails;HOB flat Right Sidelying to Sit: Patient Percentage: 40% Details for Bed Mobility Assistance: Pt requiring significant assistance today at both trunk and LE's.  Assist to bring BLE's off bed and +2 assist at trunk. Transfers Sit to Stand: 1: +2 Total assist;With upper extremity assist;From bed;From chair/3-in-1 Sit to Stand: Patient Percentage: 40% Stand to Sit: 1: +2 Total assist;With upper extremity assist;With armrests;To chair/3-in-1 Stand to Sit: Patient Percentage: 40% Details for Transfer Assistance: Pt requires max verbal cues and tactile cues for all aspects of transfer.  Poor ability to follow commands to sequence.  Pt had significant difficulty processing stand-sit. Ambulation/Gait Ambulation/Gait Assistance: 1: +2 Total  assist Ambulation/Gait: Patient Percentage: 60% Ambulation Distance (Feet): 15 Feet (then 10') Assistive device: Rolling walker Ambulation/Gait Assistance Details: Pt requiring assistance to steer RW.  Max verbal cues for safety. Gait Pattern: Step-to pattern;Decreased stride length;Shuffle;Narrow base of support Stairs: No       PT Goals Acute Rehab PT Goals PT Goal: Rolling Supine to Right Side - Progress: Not progressing PT Goal: Rolling Supine to Left Side - Progress: Not progressing PT Goal: Supine/Side to Sit - Progress: Not progressing PT Goal: Sit to Stand - Progress: Progressing toward goal PT Goal: Stand to Sit - Progress: Progressing toward goal PT Goal: Ambulate - Progress: Progressing toward goal Additional Goals Additional Goal #1: Pt will be able to verbalize 3/3 back precautions and demonstrate during all functional mobility. PT Goal: Additional Goal #1 - Progress: Not progressing  Visit Information  Last PT Received On: 07/06/12 Assistance Needed: +2 PT/OT Co-Evaluation/Treatment: Yes    Subjective Data  Subjective: Pt very lethargic, slow to respond even with yes/no's   Cognition  Overall Cognitive Status: Impaired Area of Impairment: Attention;Memory;Following commands Arousal/Alertness: Lethargic Orientation Level: Disoriented to;Time Behavior During Session: Lethargic Current Attention Level: Focused Memory: Decreased recall of precautions Memory Deficits: Likely due to lethargy Following Commands: Follows one step commands inconsistently Cognition - Other Comments: Pt required constant verbal cueing for arousal level.    Balance  Balance Balance Assessed: Yes Static Sitting Balance Static Sitting - Balance Support: Bilateral upper extremity supported;Feet supported Static Sitting - Level of Assistance: 3: Mod assist Static Sitting - Comment/# of Minutes: Pt leaning to right in sitting.  Max cues to maintain alertness and to correct LOB.  Leans  posteriorly also.  End of Session PT - End of Session Equipment Utilized During Treatment:  Gait belt;Back brace Activity Tolerance: Patient limited by fatigue;Treatment limited secondary to medical complications (Comment) (lethargy) Patient left: in chair;with call bell/phone within reach;with nursing in room Nurse Communication: Mobility status;Other (comment) (Lethargy and hypotension)    Newell Coral 07/06/2012, 1:22 PM  Newell Coral, PTA Acute Rehab [phone] (office)
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
## 2012-07-06 NOTE — Progress Notes (Signed)
Occupational Therapy Treatment Patient Details Name: [name] Mathews MRN: [id number] DOB: 1934-02-09 Today's Date: 07/06/2012 Time: 4098-1191 OT Time Calculation (min): 40 min  OT Assessment / Plan / Recommendation Comments on Treatment Session Pt extremely lethargic throughout session.      Follow Up Recommendations  Inpatient Rehab;Supervision/Assistance - 24 hour    Barriers to Discharge       Equipment Recommendations  Defer to next venue    Recommendations for Other Services Rehab consult  Frequency Min 2X/week   Plan Discharge plan remains appropriate    Precautions / Restrictions Precautions Precautions: Back Precaution Comments: Pt unable to recall any back precautions.  She was reinstructed in precautions, bur unable to recite them Required Braces or Orthoses: Spinal Brace Spinal Brace: Lumbar corset;Applied in sitting position Restrictions Weight Bearing Restrictions: No   Pertinent Vitals/Pain See vitals    ADL  Eating/Feeding: Performed;+1 Total assistance Where Assessed - Eating/Feeding: Chair Upper Body Dressing: Performed;Maximal assistance Where Assessed - Upper Body Dressing: Supported sitting Toilet Transfer: Performed;+2 Total assistance Toilet Transfer: Patient Percentage: 40% Toilet Transfer Method: Sit to stand (ambulating) Acupuncturist: Grab bars;Comfort height toilet Toileting - Clothing Manipulation and Hygiene: Performed;+1 Total assistance Where Assessed - Toileting Clothing Manipulation and Hygiene: Sit to stand from 3-in-1 or toilet Equipment Used: Back brace;Rolling walker Transfers/Ambulation Related to ADLs: Assist to steer RW and for safety. Pt requires significantly increased time. ADL Comments: Pt very lethargic and required max cueing to remain awake.    OT Diagnosis:    OT Problem List:   OT Treatment Interventions:     OT Goals ADL Goals Pt Will Perform Upper Body Dressing: with supervision;Sitting, bed;Sitting,  chair ADL Goal: Upper Body Dressing - Progress: Progressing toward goals Pt Will Transfer to Toilet: with min assist;Ambulation;3-in-1 ADL Goal: Toilet Transfer - Progress: Progressing toward goals (slowly progressing) Pt Will Perform Toileting - Clothing Manipulation: with min assist;Standing ADL Goal: Toileting - Clothing Manipulation - Progress: Progressing toward goals Pt Will Perform Toileting - Hygiene: with supervision;Sit to stand from 3-in-1/toilet ADL Goal: Toileting - Hygiene - Progress: Progressing toward goals Additional ADL Goal #1: Pt/dtr will be independent with donning/doffing brace ADL Goal: Additional Goal #1 - Progress: Progressing toward goals  Visit Information  Last OT Received On: 07/06/12 Assistance Needed: +2    Subjective Data      Prior Functioning       Cognition  Overall Cognitive Status: Impaired Area of Impairment: Attention;Memory;Following commands Arousal/Alertness: Lethargic Orientation Level: Disoriented to;Time Behavior During Session: Lethargic Current Attention Level: Focused Memory: Decreased recall of precautions Memory Deficits: Likely due to lethargy Following Commands: Follows one step commands inconsistently Cognition - Other Comments: Pt required constant verbal cueing for arousal level.    Mobility  Shoulder Instructions Bed Mobility Bed Mobility: Rolling Right;Right Sidelying to Sit Rolling Right: 3: Mod assist;With rail Right Sidelying to Sit: 1: +2 Total assist;With rails;HOB flat Right Sidelying to Sit: Patient Percentage: 40% Details for Bed Mobility Assistance: Max assist to thread bil UEs through gown. Total assist for donning back brace. Pt requiring significant assistance today at both trunk and LE's.  Assist to bring BLE's off bed and +2 assist at trunk. Transfers Sit to Stand: 1: +2 Total assist;With upper extremity assist;From bed;From chair/3-in-1 Sit to Stand: Patient Percentage: 40% Stand to Sit: 1: +2 Total  assist;With upper extremity assist;With armrests;To chair/3-in-1 Stand to Sit: Patient Percentage: 40% Details for Transfer Assistance: Pt requires max verbal cues and tactile cues for all aspects  of transfer.  Poor ability to follow commands to sequence.  Pt had significant difficulty processing stand-sit.       Exercises      Balance Balance Balance Assessed: Yes Static Sitting Balance Static Sitting - Balance Support: Bilateral upper extremity supported;Feet supported Static Sitting - Level of Assistance: 3: Mod assist Static Sitting - Comment/# of Minutes: Pt leaning to right in sitting.  Max cues to maintain alertness and to correct LOB.  Leans posteriorly also.   End of Session OT - End of Session Equipment Utilized During Treatment: Back brace Activity Tolerance: Patient limited by fatigue Patient left: in chair;with call bell/phone within reach;with nursing in room Nurse Communication: Mobility status  GO   07/06/2012 Cipriano Mile OTR/L Pager [phone] Office [phone]   Cipriano Mile 07/06/2012, 2:35 PM

## 2012-07-07 ENCOUNTER — Inpatient Hospital Stay (HOSPITAL_COMMUNITY): Payer: Medicare Other

## 2012-07-07 LAB — CBC WITH DIFFERENTIAL/PLATELET
Basophils Absolute: 0 10*3/uL (ref 0.0–0.1)
Basophils Absolute: 0 10*3/uL (ref 0.0–0.1)
Basophils Relative: 0 % (ref 0–1)
Basophils Relative: 0 % (ref 0–1)
Eosinophils Absolute: 0.1 10*3/uL (ref 0.0–0.7)
Eosinophils Absolute: 0.1 10*3/uL (ref 0.0–0.7)
Eosinophils Relative: 1 % (ref 0–5)
Eosinophils Relative: 1 % (ref 0–5)
HCT: 26 % — ABNORMAL LOW (ref 36.0–46.0)
Hemoglobin: 8.6 g/dL — ABNORMAL LOW (ref 12.0–15.0)
MCH: 27.8 pg (ref 26.0–34.0)
MCH: 27.5 pg (ref 26.0–34.0)
MCHC: 33.3 g/dL (ref 30.0–36.0)
MCHC: 33.1 g/dL (ref 30.0–36.0)
MCV: 83.3 fL (ref 78.0–100.0)
MCV: 83.1 fL (ref 78.0–100.0)
Monocytes Absolute: 0.9 10*3/uL (ref 0.1–1.0)
Monocytes Relative: 8 % (ref 3–12)
Platelets: 152 10*3/uL (ref 150–400)
RDW: 14.9 % (ref 11.5–15.5)
RDW: 14.7 % (ref 11.5–15.5)

## 2012-07-07 LAB — BASIC METABOLIC PANEL
CO2: 24 mEq/L (ref 19–32)
Calcium: 8.4 mg/dL (ref 8.4–10.5)
Creatinine, Ser: 1.08 mg/dL (ref 0.50–1.10)
Glucose, Bld: 177 mg/dL — ABNORMAL HIGH (ref 70–99)

## 2012-07-07 LAB — GLUCOSE, CAPILLARY
Glucose-Capillary: 167 mg/dL — ABNORMAL HIGH (ref 70–99)
Glucose-Capillary: 175 mg/dL — ABNORMAL HIGH (ref 70–99)
Glucose-Capillary: 196 mg/dL — ABNORMAL HIGH (ref 70–99)

## 2012-07-07 LAB — COMPREHENSIVE METABOLIC PANEL
AST: 19 U/L (ref 0–37)
Albumin: 2.7 g/dL — ABNORMAL LOW (ref 3.5–5.2)
BUN: 12 mg/dL (ref 6–23)
Calcium: 8.7 mg/dL (ref 8.4–10.5)
Creatinine, Ser: 1.14 mg/dL — ABNORMAL HIGH (ref 0.50–1.10)
GFR calc non Af Amer: 46 mL/min — ABNORMAL LOW (ref >90)
Total Bilirubin: 0.4 mg/dL (ref 0.3–1.2)

## 2012-07-07 NOTE — Progress Notes (Signed)
Patient up to [facility] feeling pressure to void. Patient was unable to void. Patient ambulated back to bed, bladder scan revealed . Foley replaced per protocol via sterile technique as patient had been in/out cath twice before. Patient tolerated well, foley draining clear yellow. Will cont to monitor.

## 2012-07-07 NOTE — Progress Notes (Signed)
Subjective: Patient up in chair, no c/o,   Objective: Vital signs in last 24 hours: Temp:  [98.7 F (37.1 C)-100.6 F (38.1 C)] 98.8 F (37.1 C) (09/07 0623) Pulse Rate:  [93-102] 93  (09/07 0623) Resp:  [20-22] 20  (09/07 0623) BP: (101-150)/(47-73) 126/65 mmHg (09/07 0623) SpO2:  [95 %-100 %] 100 % (09/07 0623)  Intake/Output from previous day: 09/06 0701 - 09/07 0700 In: 240 [P.O.:240] Out: 2000 [Urine:2000] Intake/Output this shift:   Up in chair, Ox2 - FC all Wound:c/d/i  Lab Results:  [facility] 07/07/12 0650 07/06/12 0625  WBC 10.8* 11.8*  HGB 8.3* 8.4*  HCT 24.9* 25.3*  PLT 152 143*   BMET  Basename 07/07/12 0650 07/06/12 0625  NA 133* 131*  K 3.8 3.8  CL 100 97  CO2 24 25  GLUCOSE 177* 153*  BUN 13 16  CREATININE 1.08 1.44*  CALCIUM 8.4 8.0*    Studies/Results: Dg Chest Port 1 View  07/05/2012  *RADIOLOGY REPORT*  Clinical Data: Postoperative fever.  PORTABLE CHEST - 1 VIEW  Comparison: None.  Findings: Study is limited by portable technique and body habitus. No consolidative process, pneumothorax or effusion is identified. Heart size is upper normal.  IMPRESSION: No acute finding.   Original Report Authenticated By: Bernadene Bell. Maricela Curet, M.D.     Assessment/Plan: CPM, await rehab  LOS: 3 days     Alayia Meggison R, MD 07/07/2012, 8:34 AM

## 2012-07-07 NOTE — Progress Notes (Signed)
Patient ID: [name] Mathews, female   DOB: 1933/12/17, 78 y.o.   MRN: [id number] Called because confusion. Oriented in person. Do not remember surgery or surgeon. Moves all 4 extremities. Cn, wnl. Ct head no acute changes. Lab pending

## 2012-07-07 NOTE — Progress Notes (Signed)
Patient noted to be very lethargic and could not keep eyes open. Per night RN reports, and MD notes, this appears to have been the case over the last few days as well. However in chart, the last time patient got any pain reliever other than Tylenol was two days ago on 9/5. Per night RN, patient has also had transient low grade fevers, and had urinary retention last night to where a foley had to be reinserted. Upon detailed neuro assessment, patient can not keep eyes open, can sometimes follow commands with extreme encouragement, expresses equal sensation in all extremities however sometimes stated that she felt my pinching when I was not touching patient, and was just repeating words I was saying. Family very concerned, and stating that since the patient hasn't had any narcotics for days that she should be better by now.   Called MD with this information asking for a head CT just to be safe and rule out the possibility of anything neurological going on. Received orders for stat head CT, CBC with diff, CMET, and CXR. Explained this to patient who had no questions, and to family who stated relief that the tests were to be done.   Patient just transported to CT. Will await patient's return.  Minor, Yvette Rack

## 2012-07-07 NOTE — Progress Notes (Signed)
Patient ID: [name] Mathews, female   DOB: 04/14/1934, 78 y.o.   MRN: [id number]  

## 2012-07-07 NOTE — Progress Notes (Signed)
Physical Therapy Treatment Patient Details Name: [name] Mathews MRN: [id number] DOB: [birth year] Today's Date: 07/07/2012 Time: 4098-1191 PT Time Calculation (min): 27 min  PT Assessment / Plan / Recommendation Comments on Treatment Session  Pt extremely lethargic throughout session. Concerned with amount of lethargy, RN called in after session for observation and further assessment of patient. Pt required max physical and verbal cues to maintain alertness during session. Pt not progressing secondary to fatigue    Follow Up Recommendations  Inpatient Rehab    Barriers to Discharge        Equipment Recommendations  Defer to next venue    Recommendations for Other Services Rehab consult  Frequency Min 5X/week   Plan Discharge plan remains appropriate;Frequency remains appropriate    Precautions / Restrictions Precautions Precautions: Back Precaution Comments: Pt unable to recall any back precautions.  She was reinstructed in precautions, bur unable to recite them Required Braces or Orthoses: Spinal Brace Spinal Brace: Lumbar corset;Applied in sitting position Restrictions Weight Bearing Restrictions: No   Pertinent Vitals/Pain Pt with no complaints of pain    Mobility  Bed Mobility Bed Mobility: Not assessed Transfers Transfers: Sit to Stand;Stand to Sit Sit to Stand: 1: +2 Total assist;With upper extremity assist;From bed;From chair/3-in-1 Sit to Stand: Patient Percentage: 30% Stand to Sit: 1: +2 Total assist;With upper extremity assist;With armrests;To chair/3-in-1 Stand to Sit: Patient Percentage: 30% Details for Transfer Assistance: Pt required +2 assist for anterior translation into standing as pt extremely lethargic. Completed x 2 following rest break in between ambulatino distances. Upon descent into chair, pt required physical assist through pelvis as pt could not initiate sitting on her own. MAX verbal cues for hand placement and sequencing, pt with difficulty following  instructions.  Ambulation/Gait Ambulation/Gait Assistance: 1: +2 Total assist Ambulation/Gait: Patient Percentage: 60% Ambulation Distance (Feet): 15 Feet (two trials) Assistive device: Rolling walker Ambulation/Gait Assistance Details: Pt required max cueing for encouragement, although increased awareness during ambulation. Cues for larger step length and safety with RW. Daughter had to navigate RW as pt with difficulty and trouble maitnainign focus Gait Pattern: Step-to pattern;Decreased stride length;Shuffle;Narrow base of support    Exercises     PT Diagnosis:    PT Problem List:   PT Treatment Interventions:     PT Goals Acute Rehab PT Goals PT Goal: Rolling Supine to Right Side - Progress: Not progressing PT Goal: Rolling Supine to Left Side - Progress: Not progressing PT Goal: Supine/Side to Sit - Progress: Not progressing PT Goal: Sit to Stand - Progress: Not progressing PT Goal: Stand to Sit - Progress: Not progressing PT Goal: Ambulate - Progress: Progressing toward goal Additional Goals PT Goal: Additional Goal #1 - Progress: Not progressing  Visit Information  Last PT Received On: 07/07/12 Assistance Needed: +2    Subjective Data  Subjective: Pt extremely lethargic throughout session, minimal responses   Cognition  Overall Cognitive Status: Impaired Area of Impairment: Attention;Memory;Following commands Arousal/Alertness: Lethargic Orientation Level: Disoriented to;Time;Place;Situation Behavior During Session: Lethargic Current Attention Level: Focused Memory: Decreased recall of precautions Memory Deficits: Likely due to lethargy Following Commands: Follows one step commands inconsistently Cognition - Other Comments: Pt required constant verbal cueing for arousal level.    Balance     End of Session PT - End of Session Equipment Utilized During Treatment: Gait belt;Back brace Activity Tolerance: Patient limited by fatigue;Treatment limited secondary to  medical complications (Comment) Patient left: in chair;with call bell/phone within reach;with nursing in room Nurse Communication: Mobility status;Other (comment) (  lethargy)   GP     Milana Kidney 07/07/2012, 4:44 PM

## 2012-07-08 ENCOUNTER — Inpatient Hospital Stay (HOSPITAL_COMMUNITY): Payer: Medicare Other

## 2012-07-08 DIAGNOSIS — R000 Tachycardia, unspecified: Secondary | ICD-10-CM

## 2012-07-08 DIAGNOSIS — I10 Essential (primary) hypertension: Secondary | ICD-10-CM | POA: Diagnosis present

## 2012-07-08 DIAGNOSIS — N390 Urinary tract infection, site not specified: Secondary | ICD-10-CM | POA: Diagnosis not present

## 2012-07-08 DIAGNOSIS — G9340 Encephalopathy, unspecified: Secondary | ICD-10-CM | POA: Diagnosis not present

## 2012-07-08 DIAGNOSIS — E119 Type 2 diabetes mellitus without complications: Secondary | ICD-10-CM | POA: Diagnosis present

## 2012-07-08 LAB — URINE MICROSCOPIC-ADD ON

## 2012-07-08 LAB — COMPREHENSIVE METABOLIC PANEL
Alkaline Phosphatase: 114 U/L (ref 39–117)
BUN: 12 mg/dL (ref 6–23)
CO2: 22 mEq/L (ref 19–32)
Chloride: 96 mEq/L (ref 96–112)
Creatinine, Ser: 0.89 mg/dL (ref 0.50–1.10)
GFR calc Af Amer: 71 mL/min — ABNORMAL LOW (ref >90)
GFR calc non Af Amer: 61 mL/min — ABNORMAL LOW (ref >90)
Glucose, Bld: 244 mg/dL — ABNORMAL HIGH (ref 70–99)
Potassium: 4 mEq/L (ref 3.5–5.1)
Total Bilirubin: 0.6 mg/dL (ref 0.3–1.2)

## 2012-07-08 LAB — CBC WITH DIFFERENTIAL/PLATELET
HCT: 26 % — ABNORMAL LOW (ref 36.0–46.0)
Hemoglobin: 8.8 g/dL — ABNORMAL LOW (ref 12.0–15.0)
Lymphs Abs: 0.9 10*3/uL (ref 0.7–4.0)
MCHC: 33.8 g/dL (ref 30.0–36.0)
Monocytes Absolute: 0.8 10*3/uL (ref 0.1–1.0)
Monocytes Relative: 7 % (ref 3–12)
Neutro Abs: 9.1 10*3/uL — ABNORMAL HIGH (ref 1.7–7.7)
Neutrophils Relative %: 83 % — ABNORMAL HIGH (ref 43–77)
RBC: 3.17 MIL/uL — ABNORMAL LOW (ref 3.87–5.11)

## 2012-07-08 LAB — GLUCOSE, CAPILLARY
Glucose-Capillary: 148 mg/dL — ABNORMAL HIGH (ref 70–99)
Glucose-Capillary: 187 mg/dL — ABNORMAL HIGH (ref 70–99)
Glucose-Capillary: 195 mg/dL — ABNORMAL HIGH (ref 70–99)
Glucose-Capillary: 243 mg/dL — ABNORMAL HIGH (ref 70–99)

## 2012-07-08 LAB — URINALYSIS, ROUTINE W REFLEX MICROSCOPIC
Nitrite: NEGATIVE
Protein, ur: 100 mg/dL — AB
Urobilinogen, UA: 1 mg/dL (ref 0.0–1.0)

## 2012-07-08 MED ORDER — DEXTROSE 5 % IV SOLN
1.0000 g | INTRAVENOUS | Status: DC
  Administered 2012-07-08 – 2012-07-10 (×3): 1 g via INTRAVENOUS
  Filled 2012-07-08 (×3): qty 10

## 2012-07-08 MED ORDER — SODIUM CHLORIDE 0.9 % IV SOLN: INTRAVENOUS | Status: DC

## 2012-07-08 MED ORDER — INSULIN ASPART 100 UNIT/ML ~~LOC~~ SOLN
0.0000 [IU] | Freq: Three times a day (TID) | SUBCUTANEOUS | Status: DC
  Administered 2012-07-09: 5 [IU] via SUBCUTANEOUS
  Administered 2012-07-09 – 2012-07-10 (×3): 3 [IU] via SUBCUTANEOUS
  Administered 2012-07-10: 2 [IU] via SUBCUTANEOUS
  Administered 2012-07-10: 5 [IU] via SUBCUTANEOUS

## 2012-07-08 MED ORDER — SODIUM CHLORIDE 0.9 % IJ SOLN
3.0000 mL | Freq: Two times a day (BID) | INTRAMUSCULAR | Status: DC
  Administered 2012-07-08 – 2012-07-09 (×3): 3 mL via INTRAVENOUS

## 2012-07-08 MED ORDER — IRBESARTAN 150 MG PO TABS
150.0000 mg | ORAL_TABLET | Freq: Every day | ORAL | Status: DC
  Administered 2012-07-08 – 2012-07-10 (×3): 150 mg via ORAL
  Filled 2012-07-08 (×3): qty 1

## 2012-07-08 MED ORDER — ACETAMINOPHEN 650 MG RE SUPP
650.0000 mg | RECTAL | Status: DC | PRN
  Administered 2012-07-08: 650 mg via RECTAL

## 2012-07-08 MED ORDER — METOPROLOL TARTRATE 1 MG/ML IV SOLN
5.0000 mg | Freq: Once | INTRAVENOUS | Status: DC
  Filled 2012-07-08: qty 5

## 2012-07-08 MED ORDER — INSULIN GLARGINE 100 UNIT/ML ~~LOC~~ SOLN
10.0000 [IU] | Freq: Every day | SUBCUTANEOUS | Status: DC
  Administered 2012-07-08 – 2012-07-09 (×2): 10 [IU] via SUBCUTANEOUS

## 2012-07-08 MED ORDER — AMLODIPINE BESYLATE 10 MG PO TABS
10.0000 mg | ORAL_TABLET | Freq: Every day | ORAL | Status: DC
  Administered 2012-07-09 – 2012-07-10 (×2): 10 mg via ORAL
  Filled 2012-07-08 (×3): qty 1

## 2012-07-08 MED ORDER — INSULIN ASPART 100 UNIT/ML ~~LOC~~ SOLN: 0.0000 [IU] | Freq: Every day | SUBCUTANEOUS | Status: DC

## 2012-07-08 MED ORDER — ACETAMINOPHEN 325 MG PO TABS
650.0000 mg | ORAL_TABLET | ORAL | Status: DC | PRN
  Filled 2012-07-08: qty 2

## 2012-07-08 MED ORDER — MAGNESIUM HYDROXIDE 400 MG/5ML PO SUSP
30.0000 mL | Freq: Every day | ORAL | Status: DC | PRN
  Administered 2012-07-08: 30 mL via ORAL
  Filled 2012-07-08: qty 30

## 2012-07-08 NOTE — Progress Notes (Signed)
Patient getting out of bed with staff assistance; before applying her brace; her original  back dressing was changed; the site revealed a row of blisters; on the right side of the incision; next to the steri strips ; the incision itself is clean dry and intact; no drainage; the blisters are intact; the skin was cleansed and an abd. Pad was placed to protect the skin from the brace being applied; will notify the MD. Patient and family member at bedside informed to keep the area clean and dry at all times; and good hand washing. Small skin tear also occurred in the upper back area of the original tape application corner of the original dressing. Minimal paper tape used today to adhere the abd. Pad. In place.

## 2012-07-08 NOTE — Progress Notes (Signed)
Patient ID: [name] Mathews, female   DOB: 1934/06/13, 78 y.o.   MRN: [id number] Sleepy, able to f/u commands. Move both legs and normal sensation. Able to walk with assistance. Ct head no  Acute lesion

## 2012-07-08 NOTE — Progress Notes (Signed)
Patient ID: [name] Mathews, female   DOB: 1934-05-02, 78 y.o.   MRN: [id number] Dr Jerolyn Center to help with the care of [name]. Obtunded, febril. Sodium 131. Poss uti

## 2012-07-08 NOTE — Consult Note (Signed)
Hospital Admission Note Date: 07/08/2012  Patient name: [name] Mathews Medical record number: [id number] Date of birth: 03/30/1934 Age: 78 y.o. Gender: female PCP: Pcp Not In System  Attending physician: Cristi Loron, MD  Reason for Consult: Elevated BP  History of Present Illness:This is a 78 y/o pt with essential HTN who underwent redo laminectomy of L3-4 and L4-5 Four days ago. Pt apparently was progressing well and had some periods of intermittent decreased level of consciousness. Today she was noted to have a BP of 193/85 and HR of 112. Pt was also noted to be lethargic with a temp of 101.7. According to the bedside nurse, the patient walked approximately 120 ft with PT this morning and did well.  On my evaluation, pt is somnolent but answering questions in somewhat stuporous state. She has no complaints of pain. No slurring of speech is observed in no lateralization or focality of symptoms.   Scheduled Meds:   . cefTRIAXone (ROCEPHIN)  IV  1 g Intravenous Q24H  . docusate sodium  100 mg Oral BID  . ferrous sulfate  325 mg Oral TID WC  . insulin aspart  0-20 Units Subcutaneous Q4H  . metoprolol  5 mg Intravenous Once  . sodium chloride  3 mL Intravenous Q12H   Continuous Infusions:   . DISCONTD: sodium chloride 75 mL/hr at 07/07/12 2001  . DISCONTD: sodium chloride Stopped (07/08/12 1515)  . DISCONTD: lactated ringers 75 mL/hr at 07/05/12 0637   PRN Meds:.acetaminophen, acetaminophen, acetaminophen, acetaminophen, HYDROcodone-acetaminophen, magnesium hydroxide, menthol-cetylpyridinium, ondansetron (ZOFRAN) IV, phenol Allergies: Januvia and Simvastatin Past Medical History  Diagnosis Date  . Hyperlipidemia   . Anxiety   . Diabetes mellitus   . Arthritis   . Anemia     hx of  . Cataract     left eye  . Chronic back pain   . Hypertension     sees Dr. Margret Chance, [facility] internal medicine  . Peripheral vascular disease   . Breast lump     hx of   Past  Surgical History  Procedure Date  . Appendectomy   . Breast surgery     cyst removed left side  . Tonsillectomy   . Dilation and curettage of uterus   . Back surgery   . Colonoscopy   . Cardiovascular stress test    History reviewed. No pertinent family history. History   Social History  . Marital Status: Widowed    Spouse Name: N/A    Number of Children: N/A  . Years of Education: N/A   Occupational History  . Not on file.   Social History Main Topics  . Smoking status: Never Smoker   . Smokeless tobacco: Not on file  . Alcohol Use: No  . Drug Use: No  . Sexually Active:    Other Topics Concern  . Not on file   Social History Narrative  . No narrative on file   Review of Systems: Review of systems negative except as noted in the history of present illness. Physical Exam:  Intake/Output Summary (Last 24 hours) at 07/08/12 1738 Last data filed at 07/08/12 0502  Gross per 24 hour  Intake      0 ml  Output   1200 ml  Net  -1200 ml   General: Alert, awake, oriented to family himself what appears to be in a stuporous state. She answers question appropriately but has a difficult time in week. She has no drooling there is no facial asymmetry. The  patient is in no acute distress.  HEENT: [place]/AT PEERL, EOMI Neck: Trachea midline,  no masses, no thyromegal,y no JVD, no carotid bruit OROPHARYNX:  Moist, No exudate/ erythema/lesions. Tongue and uvula are midline Heart: Regular rate and rhythm, without murmurs, rubs, gallops.  Lungs: Clear to auscultation, no wheezing or rhonchi noted. Abdomen: Soft, nontender, mildly distended, positive bowel sounds, no masses no hepatosplenomegaly noted..  Neuro: No focal neurological deficits noted cranial nerves II through XII grossly intact. DTRs 2+ bilaterally upper and lower extremities. Patient has normal vocalization. Speech is fluent and the patient does not appear to have any word finding difficulties. She moves all extremities  equal bilaterally and against gravity. Babinski's are downgoing bilaterally Musculoskeletal: No warm swelling or erythema around joints, no spinal tenderness noted. Psychiatric: Patient alert and oriented x3, good insight and cognition, good  remote recall. However recent recall somewhat murky.   Lab results:  [facility] 07/08/12 1247 07/07/12 1523  NA 131* 131*  K 4.0 4.0  CL 96 97  CO2 22 24  GLUCOSE 244* 221*  BUN 12 12  CREATININE 0.89 1.14*  CALCIUM 9.1 8.7  MG -- --  PHOS -- --    Basename 07/08/12 1247 07/07/12 1523  AST 17 19  ALT 10 10  ALKPHOS 114 101  BILITOT 0.6 0.4  PROT 7.4 6.9  ALBUMIN 2.8* 2.7*   No results found for this basename: LIPASE:2,AMYLASE:2 in the last 72 hours  Basename 07/08/12 1247 07/07/12 1523  WBC 10.9* 11.5*  NEUTROABS 9.1* 9.5*  HGB 8.8* 8.6*  HCT 26.0* 26.0*  MCV 82.0 83.1  PLT 221 172   No results found for this basename: CKTOTAL:3,CKMB:3,CKMBINDEX:3,TROPONINI:3 in the last 72 hours No components found with this basename: POCBNP:3 No results found for this basename: DDIMER:2 in the last 72 hours No results found for this basename: HGBA1C:2 in the last 72 hours No results found for this basename: CHOL:2,HDL:2,LDLCALC:2,TRIG:2,CHOLHDL:2,LDLDIRECT:2 in the last 72 hours No results found for this basename: TSH,T4TOTAL,FREET3,T3FREE,THYROIDAB in the last 72 hours No results found for this basename: VITAMINB12:2,FOLATE:2,FERRITIN:2,TIBC:2,IRON:2,RETICCTPCT:2 in the last 72 hours Imaging results:  Dg Lumbar Spine 2-3 Views  07/04/2012  *RADIOLOGY REPORT*  Clinical Data: L3-L5 PLIF  LUMBAR SPINE - 2-3 VIEW  Comparison: Earlier same day  Findings: Two C-arm images show placement of pedicle screws bilaterally at L3, L4 and L5.  There has been discectomy with placement of interbody fusion material, grossly well positioned.  IMPRESSION: PLIF in progress L3-L5.   Original Report Authenticated By: Thomasenia Sales, M.D.    Dg Lumbar Spine 2-3  Views  07/04/2012  *RADIOLOGY REPORT*  Clinical Data: L3-4 and L4-5 PLIF  LUMBAR SPINE - 2-3 VIEW  Comparison: 03/29/2012  Findings: Initial lateral view shows a probe at the pedicle level of L4.  Second film shows tissue spreaders posteriorly with a probe posterior to the L3-4 disc level.  Third film confirms the probe posterior to the L3-4 disc level.  IMPRESSION: L3-4 disc level localized.   Original Report Authenticated By: Thomasenia Sales, M.D.    Ct Head Wo Contrast  07/07/2012  *RADIOLOGY REPORT*  Clinical Data: 78 year old female right side weakness.  CT HEAD WITHOUT CONTRAST  Technique:  Contiguous axial images were obtained from the base of the skull through the vertex without contrast.  Comparison: None.  Findings: Visualized paranasal sinuses and mastoids are clear.  No acute osseous abnormality identified.  Negative orbit and scalp soft tissues.  Calcified atherosclerosis at the skull base.  Dystrophic  basal ganglia calcifications.  Prominent ventricles, but no definite ventriculomegaly; temporal horns remain fairly diminutive. No midline shift, mass effect, or evidence of mass lesion.  Confluent bilateral cerebral white matter hypodensity. No acute intracranial hemorrhage identified.  No evidence of cortically based acute infarction identified.  No suspicious intracranial vascular hyperdensity.  IMPRESSION: Dystrophic basal ganglia calcifications and moderate white matter changes suggestive of chronic small vessel disease. No acute intracranial abnormality.   Original Report Authenticated By: Harley Hallmark, M.D.    Dg Chest Port 1 View  07/08/2012  *RADIOLOGY REPORT*  Clinical Data: Chest pain.  PORTABLE CHEST - 1 VIEW  Comparison: 07/07/2012.  Findings: The heart is enlarged but stable.  There are bronchitic type lung changes and mild central vascular congestion.  No edema or effusions.  The bony thorax is intact.  IMPRESSION: Cardiac enlargement and mild vascular congestion. Streaky bibasilar  atelectasis, slightly improved on the left.   Original Report Authenticated By: P. Loralie Champagne, M.D.    Dg Chest Port 1 View  07/07/2012  *RADIOLOGY REPORT*  Clinical Data: 78 year old female with fever.  PORTABLE CHEST - 1 VIEW  Comparison: 07/05/2012.  Findings: Portable AP semi upright view 1815 hours.  Stable to slightly improved lung volumes.  Stable cardiac size and mediastinal contours.  Visualized tracheal air column is within normal limits.  No pneumothorax or pulmonary edema.  No pleural effusion.  Mild retrocardiac streaky opacity, favor atelectasis. Otherwise no confluent pulmonary opacity.  IMPRESSION: Retrocardiac streaky opacity, favor atelectasis.  No definite pneumonia.  If suspicion of pneumonia persists  PA and lateral views would be helpful when possible.   Original Report Authenticated By: Harley Hallmark, M.D.    Dg Chest Port 1 View  07/05/2012  *RADIOLOGY REPORT*  Clinical Data: Postoperative fever.  PORTABLE CHEST - 1 VIEW  Comparison: None.  Findings: Study is limited by portable technique and body habitus. No consolidative process, pneumothorax or effusion is identified. Heart size is upper normal.  IMPRESSION: No acute finding.   Original Report Authenticated By: Bernadene Bell. Maricela Curet, M.D.    Other results: EKG: sinus tachycardia.   Patient Active Hospital Problem List: Encephalopathy acute (07/08/2012)   Assessment: I suspect the patient has urinary tract infection which is leading to an acute toxic encephalopathy. This patient also may be sleep deprived and fatigue from her activity this morning all contributing to her change in mental status. I think at this time watchful waiting in addition to starting empiric antibiotics are in order for the patient. The patient shows no focality of symptoms that would suggest a CVA. Additionally the patient had a CAT scan of the head done yesterday when she also had some decrease in mental status which showed no acute changes.the patient  also has a fever greater than 100.1 which could be contributing to her encephalopathic picture. I have asked the nurse to administer Tylenol per rectum for now to lower the fever and then reevaluate the patient.   HTN (hypertension) (07/08/2012)   Assessment: The patient's blood pressure is presently marginally elevated after reaching vital signs. I did not feel that there is any need for administering emergent antihypertensive medication. I will assess patient's blood pressure and reinstate her prehospital antihypertensive medications as well as her blood pressure remained stable.    Diabetes mellitus (07/08/2012)   Assessment: Patient's blood sugars are elevated however she's only on a sliding scale of insulin. I recommend administering a low dose of Lantus 10 mg each bedtime in addition to  the sliding scale for basal coverage of her blood sugars.      Tachycardia (07/08/2012)   Assessment: Patient is mildly tachycardic which may be an early sign of sepsis in addition to the effects of hyperthermia. Will reassess after fever has been treated. Will also monitor strict intake and output.    UTI (lower urinary tract infection) (07/08/2012)   Assessment: Urinalysis has elements consistent with urinary tract infection. Given the encephalopathic picture of this patient I will start patient empirically on Rocephin.    Hyponatremia (07/08/2012)   Assessment: Patient mildly hyponatremic. We'll treat with gentle IV fluids and reassess in the a.m.  Total time spent in the initial evaluation and treatment of this patient 52 minutes   Chaley Castellanos A. 07/08/2012, 3:40PM

## 2012-07-08 NOTE — Progress Notes (Signed)
Called by RN to see patient due to lethargy and increased BP.  Upon arrival to patients room, Family at bedside and are concerned about patient.  Patient is supine in bed, on room air.  Skin is warm and dry.  Patient can state her name and that she is in the hospital.  She can not remember why she is here or the time, pleasantly confused.  Patient does not follow many commands, she did open her eyes eventually with a lot of coaxing. She will tell you that she is doing the command when she is not.  Temp is 101.7, BP 190's/80's, Hr 111.  Spoke with Dr. Ashley Royalty, ordered blood cultures, U/A, PCXR.  EKG done and reviewed.  BP at 1345 161/74, HR 104.  Md at bedside.  No RRT intervention.  Rn to call for assistance

## 2012-07-08 NOTE — Progress Notes (Signed)
Physical Therapy Treatment Patient Details Name: [name] Mathews MRN: [id number] DOB: July 29, 1934 Today's Date: 07/08/2012 Time: 4696-2952 PT Time Calculation (min): 13 min  PT Assessment / Plan / Recommendation Comments on Treatment Session  Pt still lethargic this session, although more alert than yesterday. With max encouragement, pt was able to ambulate in hallway for an increased distance. Pt educated on safe technique while maintaining back precautions.     Follow Up Recommendations  Inpatient Rehab    Barriers to Discharge        Equipment Recommendations  Defer to next venue    Recommendations for Other Services Rehab consult  Frequency Min 5X/week   Plan Discharge plan remains appropriate;Frequency remains appropriate    Precautions / Restrictions Precautions Precautions: Back Precaution Comments: Pt unable to recall any back precautions.  She was reinstructed in precautions, bur unable to recite them Required Braces or Orthoses: Spinal Brace Spinal Brace: Lumbar corset;Applied in sitting position Restrictions Weight Bearing Restrictions: No   Pertinent Vitals/Pain Pt with no pain complaints.     Mobility  Bed Mobility Bed Mobility: Not assessed Transfers Transfers: Sit to Stand;Stand to Sit Sit to Stand: 1: +2 Total assist;With upper extremity assist;From bed;From chair/3-in-1 Sit to Stand: Patient Percentage: 60% Stand to Sit: With upper extremity assist;With armrests;To chair/3-in-1;3: Mod assist Details for Transfer Assistance: VC for hand placement for a safe transfer to/from chair. Cueing for anterior translation while maintaining back precautions. Pt with increased LE use this session requiring less physical assistance for stand and controlled descent Ambulation/Gait Ambulation/Gait Assistance: 3: Mod assist (+2 for chair follow) Ambulation Distance (Feet): 120 Feet Assistive device: Rolling walker Ambulation/Gait Assistance Details: Cueing throughout session  for max encouragement, increased step length and upright posture. Pt with intermittent shuffling, improved step length with cueing. Daughter still guiding RW and initiating movement forward, cues for safe distance to RW for support.  Gait Pattern: Step-to pattern;Decreased stride length;Shuffle;Narrow base of support Gait velocity: decreased gait speed Stairs: No    Exercises     PT Diagnosis:    PT Problem List:   PT Treatment Interventions:     PT Goals Acute Rehab PT Goals PT Goal: Supine/Side to Sit - Progress: Progressing toward goal PT Goal: Sit to Stand - Progress: Progressing toward goal PT Goal: Stand to Sit - Progress: Progressing toward goal PT Goal: Ambulate - Progress: Progressing toward goal Additional Goals PT Goal: Additional Goal #1 - Progress: Progressing toward goal  Visit Information  Last PT Received On: 07/08/12 Assistance Needed: +2    Subjective Data  Subjective: Pt still lethargic, increased responses this session   Cognition  Overall Cognitive Status: Impaired Area of Impairment: Attention;Memory;Following commands Arousal/Alertness: Lethargic Orientation Level: Disoriented to;Time;Place;Situation Behavior During Session: Lethargic Current Attention Level: Focused Memory: Decreased recall of precautions Memory Deficits: Likely due to lethargy Following Commands: Follows one step commands inconsistently Cognition - Other Comments: Pt required constant verbal cueing for arousal level.    Balance     End of Session PT - End of Session Equipment Utilized During Treatment: Gait belt;Back brace Activity Tolerance: Patient limited by fatigue;Treatment limited secondary to medical complications (Comment) Patient left: in chair;with call bell/phone within reach;with nursing in room;with family/visitor present Nurse Communication: Mobility status   GP     Milana Kidney 07/08/2012, 9:13 AM

## 2012-07-08 NOTE — Progress Notes (Signed)
Patient ID: [name] Mathews, female   DOB: 01-25-1934, 78 y.o.   MRN: [id number] Sodium was  131. To get cbc and c met in am. Sensitivity to tape. Wound to ramain open to air

## 2012-07-09 LAB — GLUCOSE, CAPILLARY
Glucose-Capillary: 154 mg/dL — ABNORMAL HIGH (ref 70–99)
Glucose-Capillary: 185 mg/dL — ABNORMAL HIGH (ref 70–99)
Glucose-Capillary: 186 mg/dL — ABNORMAL HIGH (ref 70–99)
Glucose-Capillary: 236 mg/dL — ABNORMAL HIGH (ref 70–99)

## 2012-07-09 LAB — COMPREHENSIVE METABOLIC PANEL
ALT: 8 U/L (ref 0–35)
AST: 15 U/L (ref 0–37)
Albumin: 2.3 g/dL — ABNORMAL LOW (ref 3.5–5.2)
Calcium: 8.6 mg/dL (ref 8.4–10.5)
Sodium: 135 mEq/L (ref 135–145)
Total Protein: 6.5 g/dL (ref 6.0–8.3)

## 2012-07-09 LAB — CBC WITH DIFFERENTIAL/PLATELET
Basophils Absolute: 0 10*3/uL (ref 0.0–0.1)
Basophils Relative: 0 % (ref 0–1)
Hemoglobin: 7.4 g/dL — ABNORMAL LOW (ref 12.0–15.0)
MCHC: 32.7 g/dL (ref 30.0–36.0)
Monocytes Relative: 9 % (ref 3–12)
Neutro Abs: 7 10*3/uL (ref 1.7–7.7)
Neutrophils Relative %: 74 % (ref 43–77)
RDW: 14.9 % (ref 11.5–15.5)

## 2012-07-09 LAB — CBC
MCHC: 32.3 g/dL (ref 30.0–36.0)
RDW: 15 % (ref 11.5–15.5)

## 2012-07-09 LAB — HEMOGLOBIN A1C
Hgb A1c MFr Bld: 8.4 % — ABNORMAL HIGH (ref <5.7)
Mean Plasma Glucose: 194 mg/dL — ABNORMAL HIGH (ref <117)

## 2012-07-09 MED ORDER — PANTOPRAZOLE SODIUM 40 MG PO TBEC
40.0000 mg | DELAYED_RELEASE_TABLET | Freq: Every day | ORAL | Status: DC
  Administered 2012-07-09 – 2012-07-10 (×2): 40 mg via ORAL
  Filled 2012-07-09 (×2): qty 1

## 2012-07-09 MED ORDER — POLYETHYLENE GLYCOL 3350 17 G PO PACK
17.0000 g | PACK | Freq: Every day | ORAL | Status: DC
  Administered 2012-07-09 – 2012-07-10 (×2): 17 g via ORAL
  Filled 2012-07-09 (×2): qty 1

## 2012-07-09 NOTE — Progress Notes (Signed)
Physical Therapy Treatment Patient Details Name: [name] Mathews MRN: [id number] DOB: [birth year] Today's Date: 07/09/2012 Time: 9562-1308 PT Time Calculation (min): 21 min  PT Assessment / Plan / Recommendation Comments on Treatment Session  Patient more alert than yesterday. Patient daughter stated that she had had a much better day today. Patient very motivated to progress with therapy. Still all around Min A with all mobiilty and for safety    Follow Up Recommendations  Inpatient Rehab    Barriers to Discharge        Equipment Recommendations  Defer to next venue    Recommendations for Other Services    Frequency Min 5X/week   Plan Discharge plan remains appropriate;Frequency remains appropriate    Precautions / Restrictions Precautions Precautions: Back Precaution Comments: Patient able to recall all precautions with increased time Required Braces or Orthoses: Spinal Brace Spinal Brace: Lumbar corset;Applied in sitting position   Pertinent Vitals/Pain     Mobility  Bed Mobility Rolling Right: 4: Min assist;With rail Right Sidelying to Sit: 4: Min assist;With rails Sit to Sidelying Right: 4: Min assist;With rail Details for Bed Mobility Assistance: Cues for technique. A for shoulders up out of bed and LEs back into bed.  Transfers Sit to Stand: 4: Min assist;With upper extremity assist;From bed;With armrests;From chair/3-in-1 Stand to Sit: 4: Min assist;With upper extremity assist;To chair/3-in-1;To bed;With armrests Details for Transfer Assistance: A to ensure balance and cues for proper technique. Patient required A to control descent onto bed/recliner Ambulation/Gait Ambulation/Gait Assistance: 4: Min assist Ambulation Distance (Feet): 190 Feet Assistive device: Rolling walker Ambulation/Gait Assistance Details: Max cues for RW positioning and A with RW placement and to avoid obstacles as patient running into wall and objects in hallway. Cues for upright opsture and to  look forward Gait Pattern: Step-to pattern;Decreased step length - right;Decreased step length - left;Trunk flexed    Exercises     PT Diagnosis:    PT Problem List:   PT Treatment Interventions:     PT Goals Acute Rehab PT Goals PT Goal: Rolling Supine to Left Side - Progress: Progressing toward goal PT Goal: Supine/Side to Sit - Progress: Progressing toward goal PT Goal: Sit to Stand - Progress: Progressing toward goal PT Goal: Stand to Sit - Progress: Progressing toward goal PT Goal: Ambulate - Progress: Progressing toward goal Additional Goals PT Goal: Additional Goal #1 - Progress: Progressing toward goal  Visit Information  Last PT Received On: 07/09/12 Assistance Needed: +1    Subjective Data      Cognition  Overall Cognitive Status: Appears within functional limits for tasks assessed/performed Arousal/Alertness: Awake/alert Orientation Level: Appears intact for tasks assessed Behavior During Session: [facility] for tasks performed Cognition - Other Comments: Patient sleepy but able to participate.     Balance     End of Session PT - End of Session Equipment Utilized During Treatment: Gait belt;Back brace Activity Tolerance: Patient tolerated treatment well Patient left: in chair;with call bell/phone within reach;with family/visitor present Nurse Communication: Mobility status   GP     Fredrich Birks 07/09/2012, 12:01 PM 07/09/2012 Fredrich Birks PTA [phone] pager [phone] office

## 2012-07-09 NOTE — Progress Notes (Signed)
Rehab admissions - Awaiting medical stability prior to admission to inpatient rehab.  If ready in am, can potentially admit tomorrow.  I do expect to have bed availability in am.  Call me for questions.  #[phone]

## 2012-07-09 NOTE — Progress Notes (Signed)
Patient ID: [name] Mathews, female   DOB: 21-Jan-1934, 78 y.o.   MRN: [id number]

## 2012-07-09 NOTE — Consult Note (Signed)
Subjective: Patient fully awake and alert this morning sitting up in the chair. She is unable to recall events of yesterday. Both of her daughters are present at the bedside. The patient denies any pain, headache, weakness, chest pain.  Interval history: Urine cultures negative so far. Objective: Filed Vitals:   07/09/12 0230 07/09/12 0559 07/09/12 1000 07/09/12 1400  BP: 150/73 121/67 131/66 140/62  Pulse: 90 90 82 91  Temp: 100.8 F (38.2 C) 98.9 F (37.2 C) 99.2 F (37.3 C) 99.9 F (37.7 C)  TempSrc: Oral Oral Oral Oral  Resp: 19 18 18 18   Height:      Weight:      SpO2: 95% 98% 98% 97%   Weight change:   Intake/Output Summary (Last 24 hours) at 07/09/12 1728 Last data filed at 07/09/12 0559  Gross per 24 hour  Intake    120 ml  Output   1600 ml  Net  -1480 ml    General: Alert, awake, oriented x3, in no acute distress.  HEENT: [place]/AT PEERL, EOMI  Heart: Regular rate and rhythm, without murmurs, rubs, gallops.  Lungs: Clear to auscultation.  Abdomen: Soft, nontender, nondistended, positive bowel sounds, no masses no hepatosplenomegaly noted..  Neuro: No focal neurological deficits noted. Strength  equal and symmetrical in bilateral upper and lower extremities. Musculoskeletal: No warm swelling or erythema around joints, no spinal tenderness noted. Psychiatric: Patient alert and oriented x3, good insight and cognition.    Lab Results:  [facility] 07/09/12 0525 07/08/12 1247  NA 135 131*  K 4.0 4.0  CL 101 96  CO2 25 22  GLUCOSE 186* 244*  BUN 15 12  CREATININE 1.04 0.89  CALCIUM 8.6 9.1  MG -- --  PHOS -- --    Basename 07/09/12 0525 07/08/12 1247  AST 15 17  ALT 8 10  ALKPHOS 90 114  BILITOT 0.5 0.6  PROT 6.5 7.4  ALBUMIN 2.3* 2.8*   No results found for this basename: LIPASE:2,AMYLASE:2 in the last 72 hours  Basename 07/09/12 0900 07/09/12 0525 07/08/12 1247  WBC 9.6 9.5 --  NEUTROABS -- 7.0 9.1*  HGB 8.0* 7.4* --  HCT 24.8* 22.6* --  MCV 84.9  83.1 --  PLT 239 206 --   No results found for this basename: CKTOTAL:3,CKMB:3,CKMBINDEX:3,TROPONINI:3 in the last 72 hours No components found with this basename: POCBNP:3 No results found for this basename: DDIMER:2 in the last 72 hours  Basename 07/08/12 1830  HGBA1C 8.4*   No results found for this basename: CHOL:2,HDL:2,LDLCALC:2,TRIG:2,CHOLHDL:2,LDLDIRECT:2 in the last 72 hours No results found for this basename: TSH,T4TOTAL,FREET3,T3FREE,THYROIDAB in the last 72 hours No results found for this basename: VITAMINB12:2,FOLATE:2,FERRITIN:2,TIBC:2,IRON:2,RETICCTPCT:2 in the last 72 hours  Micro Results: Recent Results (from the past 240 hour(s))  SURGICAL PCR SCREEN     Status: Normal   Collection Time   07/03/12 12:05 PM      Component Value Range Status Comment   MRSA, PCR NEGATIVE  NEGATIVE Final    Staphylococcus aureus NEGATIVE  NEGATIVE Final   URINE CULTURE     Status: Normal   Collection Time   07/05/12  7:57 AM      Component Value Range Status Comment   Specimen Description URINE, CATHETERIZED   Final    Special Requests Normal   Final    Culture  Setup Time 07/05/2012 08:41   Final    Colony Count NO GROWTH   Final    Culture NO GROWTH   Final  Report Status 07/06/2012 FINAL   Final   CULTURE, BLOOD (ROUTINE X 2)     Status: Normal (Preliminary result)   Collection Time   07/05/12  9:05 AM      Component Value Range Status Comment   Specimen Description BLOOD ARM LEFT   Final    Special Requests BOTTLES DRAWN AEROBIC AND ANAEROBIC 10CC   Final    Culture  Setup Time 07/05/2012 13:40   Final    Culture     Final    Value:        BLOOD CULTURE RECEIVED NO GROWTH TO DATE CULTURE WILL BE HELD FOR 5 DAYS BEFORE ISSUING A FINAL NEGATIVE REPORT   Report Status PENDING   Incomplete   CULTURE, BLOOD (ROUTINE X 2)     Status: Normal (Preliminary result)   Collection Time   07/05/12  9:10 AM      Component Value Range Status Comment   Specimen Description BLOOD ARM LEFT    Final    Special Requests BOTTLES DRAWN AEROBIC AND ANAEROBIC 10CC   Final    Culture  Setup Time 07/05/2012 13:40   Final    Culture     Final    Value:        BLOOD CULTURE RECEIVED NO GROWTH TO DATE CULTURE WILL BE HELD FOR 5 DAYS BEFORE ISSUING A FINAL NEGATIVE REPORT   Report Status PENDING   Incomplete   CULTURE, BLOOD (ROUTINE X 2)     Status: Normal (Preliminary result)   Collection Time   07/08/12  2:10 PM      Component Value Range Status Comment   Specimen Description BLOOD LEFT ARM   Final    Special Requests BOTTLES DRAWN AEROBIC AND ANAEROBIC 10CC   Final    Culture  Setup Time 07/08/2012 23:24   Final    Culture     Final    Value:        BLOOD CULTURE RECEIVED NO GROWTH TO DATE CULTURE WILL BE HELD FOR 5 DAYS BEFORE ISSUING A FINAL NEGATIVE REPORT   Report Status PENDING   Incomplete   CULTURE, BLOOD (ROUTINE X 2)     Status: Normal (Preliminary result)   Collection Time   07/08/12  2:20 PM      Component Value Range Status Comment   Specimen Description BLOOD LEFT HAND   Final    Special Requests BOTTLES DRAWN AEROBIC AND ANAEROBIC 10CC   Final    Culture  Setup Time 07/08/2012 23:23   Final    Culture     Final    Value:        BLOOD CULTURE RECEIVED NO GROWTH TO DATE CULTURE WILL BE HELD FOR 5 DAYS BEFORE ISSUING A FINAL NEGATIVE REPORT   Report Status PENDING   Incomplete     Studies/Results: Dg Lumbar Spine 2-3 Views  07/04/2012  *RADIOLOGY REPORT*  Clinical Data: L3-L5 PLIF  LUMBAR SPINE - 2-3 VIEW  Comparison: Earlier same day  Findings: Two C-arm images show placement of pedicle screws bilaterally at L3, L4 and L5.  There has been discectomy with placement of interbody fusion material, grossly well positioned.  IMPRESSION: PLIF in progress L3-L5.   Original Report Authenticated By: Thomasenia Sales, M.D.    Dg Lumbar Spine 2-3 Views  07/04/2012  *RADIOLOGY REPORT*  Clinical Data: L3-4 and L4-5 PLIF  LUMBAR SPINE - 2-3 VIEW  Comparison: 03/29/2012  Findings: Initial  lateral view shows a probe at  the pedicle level of L4.  Second film shows tissue spreaders posteriorly with a probe posterior to the L3-4 disc level.  Third film confirms the probe posterior to the L3-4 disc level.  IMPRESSION: L3-4 disc level localized.   Original Report Authenticated By: Thomasenia Sales, M.D.    Ct Head Wo Contrast  07/07/2012  *RADIOLOGY REPORT*  Clinical Data: 78 year old female right side weakness.  CT HEAD WITHOUT CONTRAST  Technique:  Contiguous axial images were obtained from the base of the skull through the vertex without contrast.  Comparison: None.  Findings: Visualized paranasal sinuses and mastoids are clear.  No acute osseous abnormality identified.  Negative orbit and scalp soft tissues.  Calcified atherosclerosis at the skull base.  Dystrophic basal ganglia calcifications.  Prominent ventricles, but no definite ventriculomegaly; temporal horns remain fairly diminutive. No midline shift, mass effect, or evidence of mass lesion.  Confluent bilateral cerebral white matter hypodensity. No acute intracranial hemorrhage identified.  No evidence of cortically based acute infarction identified.  No suspicious intracranial vascular hyperdensity.  IMPRESSION: Dystrophic basal ganglia calcifications and moderate white matter changes suggestive of chronic small vessel disease. No acute intracranial abnormality.   Original Report Authenticated By: Harley Hallmark, M.D.    Dg Chest Port 1 View  07/08/2012  *RADIOLOGY REPORT*  Clinical Data: Chest pain.  PORTABLE CHEST - 1 VIEW  Comparison: 07/07/2012.  Findings: The heart is enlarged but stable.  There are bronchitic type lung changes and mild central vascular congestion.  No edema or effusions.  The bony thorax is intact.  IMPRESSION: Cardiac enlargement and mild vascular congestion. Streaky bibasilar atelectasis, slightly improved on the left.   Original Report Authenticated By: P. Loralie Champagne, M.D.    Dg Chest Port 1 View  07/07/2012   *RADIOLOGY REPORT*  Clinical Data: 78 year old female with fever.  PORTABLE CHEST - 1 VIEW  Comparison: 07/05/2012.  Findings: Portable AP semi upright view 1815 hours.  Stable to slightly improved lung volumes.  Stable cardiac size and mediastinal contours.  Visualized tracheal air column is within normal limits.  No pneumothorax or pulmonary edema.  No pleural effusion.  Mild retrocardiac streaky opacity, favor atelectasis. Otherwise no confluent pulmonary opacity.  IMPRESSION: Retrocardiac streaky opacity, favor atelectasis.  No definite pneumonia.  If suspicion of pneumonia persists  PA and lateral views would be helpful when possible.   Original Report Authenticated By: Harley Hallmark, M.D.    Dg Chest Port 1 View  07/05/2012  *RADIOLOGY REPORT*  Clinical Data: Postoperative fever.  PORTABLE CHEST - 1 VIEW  Comparison: None.  Findings: Study is limited by portable technique and body habitus. No consolidative process, pneumothorax or effusion is identified. Heart size is upper normal.  IMPRESSION: No acute finding.   Original Report Authenticated By: Bernadene Bell. Maricela Curet, M.D.     Medications: I have reviewed the patient's current medications. Scheduled Meds:   . amLODipine  10 mg Oral Daily  . cefTRIAXone (ROCEPHIN)  IV  1 g Intravenous Q24H  . docusate sodium  100 mg Oral BID  . ferrous sulfate  325 mg Oral TID WC  . insulin aspart  0-15 Units Subcutaneous TID WC  . insulin aspart  0-5 Units Subcutaneous QHS  . insulin glargine  10 Units Subcutaneous QHS  . irbesartan  150 mg Oral Daily  . metoprolol  5 mg Intravenous Once  . pantoprazole  40 mg Oral QAC breakfast  . polyethylene glycol  17 g Oral Daily  . sodium chloride  3 mL Intravenous Q12H  . DISCONTD: insulin aspart  0-20 Units Subcutaneous Q4H   Continuous Infusions:  PRN Meds:.acetaminophen, acetaminophen, acetaminophen, acetaminophen, HYDROcodone-acetaminophen, magnesium hydroxide, menthol-cetylpyridinium, ondansetron (ZOFRAN)  IV, phenol Assessment/Plan: Patient Active Hospital Problem List: HTN (hypertension) (07/08/2012)   Assessment: Patient's blood pressure much better controlled now the blood pressure medications restarted. We'll continue to monitor.   Diabetes mellitus (07/08/2012)   Assessment: Blood sugars improved on current regimen. We'll continue to monitor and likely change over to prehospital diabetic measurement on tomorrow.     Encephalopathy acute (07/08/2012)   Assessment: Resolved.   Tachycardia (07/08/2012)   Assessment: Resolved    UTI (lower urinary tract infection) (07/08/2012)   Assessment: Patient on Rocephin day #2. Urine cultures negative so far.     LOS: 5 days

## 2012-07-09 NOTE — Progress Notes (Signed)
Patient ID: [name] Mathews, female   DOB: [birth year], 78 y.o.   MRN: [id number] Subjective:  The patient is alert and pleasant. She looks much better. I spoke with the patient's daughters. She denies chest pain, shortness of breath et Karie Soda. She does admit to being a bit fatigued.  Objective: Vital signs in last 24 hours: Temp:  [98.3 F (36.8 C)-101.7 F (38.7 C)] 98.9 F (37.2 C) (09/09 0559) Pulse Rate:  [88-112] 90  (09/09 0559) Resp:  [18-22] 18  (09/09 0559) BP: (100-193)/(67-85) 121/67 mmHg (09/09 0559) SpO2:  [94 %-98 %] 98 % (09/09 0559)  Intake/Output from previous day: 09/08 0701 - 09/09 0700 In: 120 [P.O.:120] Out: 1600 [Urine:1600] Intake/Output this shift:    Physical exam the patient is alert and oriented. Her lower extremity strength is normal. She has some blisters on her back from the adhesive.  Lab Results:  [facility] 07/09/12 0525 07/08/12 1247  WBC 9.5 10.9*  HGB 7.4* 8.8*  HCT 22.6* 26.0*  PLT 206 221   BMET  Basename 07/09/12 0525 07/08/12 1247  NA 135 131*  K 4.0 4.0  CL 101 96  CO2 25 22  GLUCOSE 186* 244*  BUN 15 12  CREATININE 1.04 0.89  CALCIUM 8.6 9.1    Studies/Results: Ct Head Wo Contrast  07/07/2012  *RADIOLOGY REPORT*  Clinical Data: 78 year old female right side weakness.  CT HEAD WITHOUT CONTRAST  Technique:  Contiguous axial images were obtained from the base of the skull through the vertex without contrast.  Comparison: None.  Findings: Visualized paranasal sinuses and mastoids are clear.  No acute osseous abnormality identified.  Negative orbit and scalp soft tissues.  Calcified atherosclerosis at the skull base.  Dystrophic basal ganglia calcifications.  Prominent ventricles, but no definite ventriculomegaly; temporal horns remain fairly diminutive. No midline shift, mass effect, or evidence of mass lesion.  Confluent bilateral cerebral white matter hypodensity. No acute intracranial hemorrhage identified.  No evidence of cortically  based acute infarction identified.  No suspicious intracranial vascular hyperdensity.  IMPRESSION: Dystrophic basal ganglia calcifications and moderate white matter changes suggestive of chronic small vessel disease. No acute intracranial abnormality.   Original Report Authenticated By: Harley Hallmark, M.D.    Dg Chest Port 1 View  07/08/2012  *RADIOLOGY REPORT*  Clinical Data: Chest pain.  PORTABLE CHEST - 1 VIEW  Comparison: 07/07/2012.  Findings: The heart is enlarged but stable.  There are bronchitic type lung changes and mild central vascular congestion.  No edema or effusions.  The bony thorax is intact.  IMPRESSION: Cardiac enlargement and mild vascular congestion. Streaky bibasilar atelectasis, slightly improved on the left.   Original Report Authenticated By: P. Loralie Champagne, M.D.    Dg Chest Port 1 View  07/07/2012  *RADIOLOGY REPORT*  Clinical Data: 78 year old female with fever.  PORTABLE CHEST - 1 VIEW  Comparison: 07/05/2012.  Findings: Portable AP semi upright view 1815 hours.  Stable to slightly improved lung volumes.  Stable cardiac size and mediastinal contours.  Visualized tracheal air column is within normal limits.  No pneumothorax or pulmonary edema.  No pleural effusion.  Mild retrocardiac streaky opacity, favor atelectasis. Otherwise no confluent pulmonary opacity.  IMPRESSION: Retrocardiac streaky opacity, favor atelectasis.  No definite pneumonia.  If suspicion of pneumonia persists  PA and lateral views would be helpful when possible.   Original Report Authenticated By: Harley Hallmark, M.D.     Assessment/Plan: Postop day #5: The patient is doing well neurologically. She will likely  need rehabilitation.  Hyponatremia: This has resolved.  Anemia: I am going to repeat her CBC to make sure this was not a mistake. If her hemoglobin is indeed 7.4, and she will need to be transfused. I discussed this with the patient and her daughter's.  LOS: 5 days     [name] Mathews  D 07/09/2012, 10:29 AM

## 2012-07-10 ENCOUNTER — Inpatient Hospital Stay (HOSPITAL_COMMUNITY)
Admission: RE | Admit: 2012-07-10 | Discharge: 2012-07-18 | DRG: 945 | Disposition: A | Discharge status: Home / Self Care | Payer: Medicare Other | Source: Ambulatory Visit | Attending: Physical Medicine & Rehabilitation | Admitting: Physical Medicine & Rehabilitation

## 2012-07-10 ENCOUNTER — Encounter (HOSPITAL_COMMUNITY): Payer: Self-pay | Admitting: *Deleted

## 2012-07-10 DIAGNOSIS — N390 Urinary tract infection, site not specified: Secondary | ICD-10-CM

## 2012-07-10 DIAGNOSIS — Z5189 Encounter for other specified aftercare: Secondary | ICD-10-CM

## 2012-07-10 DIAGNOSIS — B952 Enterococcus as the cause of diseases classified elsewhere: Secondary | ICD-10-CM | POA: Diagnosis present

## 2012-07-10 DIAGNOSIS — Z7982 Long term (current) use of aspirin: Secondary | ICD-10-CM

## 2012-07-10 DIAGNOSIS — K5900 Constipation, unspecified: Secondary | ICD-10-CM | POA: Diagnosis present

## 2012-07-10 DIAGNOSIS — IMO0002 Reserved for concepts with insufficient information to code with codable children: Secondary | ICD-10-CM

## 2012-07-10 DIAGNOSIS — M5410 Radiculopathy, site unspecified: Secondary | ICD-10-CM

## 2012-07-10 DIAGNOSIS — I10 Essential (primary) hypertension: Secondary | ICD-10-CM | POA: Diagnosis present

## 2012-07-10 DIAGNOSIS — D649 Anemia, unspecified: Secondary | ICD-10-CM | POA: Diagnosis present

## 2012-07-10 DIAGNOSIS — M4310 Spondylolisthesis, site unspecified: Secondary | ICD-10-CM | POA: Diagnosis present

## 2012-07-10 DIAGNOSIS — E1142 Type 2 diabetes mellitus with diabetic polyneuropathy: Secondary | ICD-10-CM | POA: Diagnosis present

## 2012-07-10 DIAGNOSIS — M48061 Spinal stenosis, lumbar region without neurogenic claudication: Secondary | ICD-10-CM

## 2012-07-10 DIAGNOSIS — E1149 Type 2 diabetes mellitus with other diabetic neurological complication: Secondary | ICD-10-CM | POA: Diagnosis present

## 2012-07-10 LAB — URINE CULTURE: Culture: NO GROWTH

## 2012-07-10 LAB — GLUCOSE, CAPILLARY
Glucose-Capillary: 225 mg/dL — ABNORMAL HIGH (ref 70–99)
Glucose-Capillary: 195 mg/dL — ABNORMAL HIGH (ref 70–99)

## 2012-07-10 MED ORDER — VITAMIN D (ERGOCALCIFEROL) 1.25 MG (50000 UNIT) PO CAPS
50000.0000 [IU] | ORAL_CAPSULE | ORAL | Status: DC
  Administered 2012-07-10: 50000 [IU] via ORAL
  Filled 2012-07-10: qty 1

## 2012-07-10 MED ORDER — VITAMIN D (ERGOCALCIFEROL) 1.25 MG (50000 UNIT) PO CAPS
50000.0000 [IU] | ORAL_CAPSULE | ORAL | Status: DC
Start: 2012-07-10 — End: 2012-07-10
  Administered 2012-07-10: 50000 [IU] via ORAL
  Filled 2012-07-10: qty 1

## 2012-07-10 MED ORDER — HYDROCODONE-ACETAMINOPHEN 5-325 MG PO TABS: 1.0000 | ORAL_TABLET | ORAL | Status: DC | PRN

## 2012-07-10 MED ORDER — IRBESARTAN 150 MG PO TABS
150.0000 mg | ORAL_TABLET | Freq: Every day | ORAL | Status: DC
  Administered 2012-07-11 – 2012-07-18 (×6): 150 mg via ORAL
  Filled 2012-07-10 (×11): qty 1

## 2012-07-10 MED ORDER — PANTOPRAZOLE SODIUM 40 MG PO TBEC
40.0000 mg | DELAYED_RELEASE_TABLET | Freq: Every day | ORAL | Status: DC
  Administered 2012-07-11 – 2012-07-18 (×8): 40 mg via ORAL
  Filled 2012-07-10 (×13): qty 1

## 2012-07-10 MED ORDER — INSULIN ASPART 100 UNIT/ML ~~LOC~~ SOLN
0.0000 [IU] | Freq: Three times a day (TID) | SUBCUTANEOUS | Status: DC
  Administered 2012-07-11 – 2012-07-13 (×4): 3 [IU] via SUBCUTANEOUS
  Administered 2012-07-13: 2 [IU] via SUBCUTANEOUS

## 2012-07-10 MED ORDER — ASPIRIN EC 81 MG PO TBEC
81.0000 mg | DELAYED_RELEASE_TABLET | Freq: Every day | ORAL | Status: DC
  Administered 2012-07-10: 81 mg via ORAL
  Filled 2012-07-10: qty 1

## 2012-07-10 MED ORDER — FLEET ENEMA 7-19 GM/118ML RE ENEM
1.0000 | ENEMA | Freq: Every day | RECTAL | Status: DC | PRN
  Administered 2012-07-10: 1 via RECTAL
  Filled 2012-07-10: qty 1

## 2012-07-10 MED ORDER — ASPIRIN EC 81 MG PO TBEC
81.0000 mg | DELAYED_RELEASE_TABLET | Freq: Every day | ORAL | Status: DC
  Administered 2012-07-11 – 2012-07-18 (×8): 81 mg via ORAL
  Filled 2012-07-10 (×11): qty 1

## 2012-07-10 MED ORDER — ASPIRIN 81 MG PO TABS: 81.0000 mg | ORAL_TABLET | Freq: Every day | ORAL | Status: DC

## 2012-07-10 MED ORDER — GLYBURIDE-METFORMIN 5-500 MG PO TABS: 1.0000 | ORAL_TABLET | Freq: Two times a day (BID) | ORAL | Status: DC

## 2012-07-10 MED ORDER — LORATADINE 10 MG PO TABS
10.0000 mg | ORAL_TABLET | Freq: Every day | ORAL | Status: DC
  Administered 2012-07-10: 10 mg via ORAL
  Filled 2012-07-10: qty 1

## 2012-07-10 MED ORDER — SORBITOL 70 % SOLN: 30.0000 mL | Freq: Every day | Status: DC | PRN

## 2012-07-10 MED ORDER — ONDANSETRON HCL 4 MG PO TABS: 4.0000 mg | ORAL_TABLET | Freq: Four times a day (QID) | ORAL | Status: DC | PRN

## 2012-07-10 MED ORDER — BISACODYL 10 MG RE SUPP: 10.0000 mg | Freq: Every day | RECTAL | Status: DC | PRN

## 2012-07-10 MED ORDER — POLYETHYLENE GLYCOL 3350 17 G PO PACK
17.0000 g | PACK | Freq: Every day | ORAL | Status: DC | PRN
  Filled 2012-07-10: qty 1

## 2012-07-10 MED ORDER — METFORMIN HCL 500 MG PO TABS
500.0000 mg | ORAL_TABLET | Freq: Two times a day (BID) | ORAL | Status: DC
  Administered 2012-07-11 (×2): 500 mg via ORAL
  Filled 2012-07-10 (×7): qty 1

## 2012-07-10 MED ORDER — POLYETHYLENE GLYCOL 3350 17 G PO PACK
17.0000 g | PACK | Freq: Every day | ORAL | Status: DC
  Administered 2012-07-11: 17 g via ORAL
  Filled 2012-07-10 (×4): qty 1

## 2012-07-10 MED ORDER — LORATADINE 10 MG PO TABS
10.0000 mg | ORAL_TABLET | Freq: Every day | ORAL | Status: DC
  Administered 2012-07-11 – 2012-07-18 (×8): 10 mg via ORAL
  Filled 2012-07-10 (×11): qty 1

## 2012-07-10 MED ORDER — LORAZEPAM 0.5 MG PO TABS: 0.5000 mg | ORAL_TABLET | Freq: Every day | ORAL | Status: DC | PRN

## 2012-07-10 MED ORDER — METFORMIN HCL 500 MG PO TABS
500.0000 mg | ORAL_TABLET | Freq: Two times a day (BID) | ORAL | Status: DC
  Filled 2012-07-10 (×2): qty 1

## 2012-07-10 MED ORDER — GLYBURIDE 5 MG PO TABS
5.0000 mg | ORAL_TABLET | Freq: Two times a day (BID) | ORAL | Status: DC
  Administered 2012-07-11 (×2): 5 mg via ORAL
  Filled 2012-07-10 (×6): qty 1

## 2012-07-10 MED ORDER — ACETAMINOPHEN 325 MG PO TABS
325.0000 mg | ORAL_TABLET | ORAL | Status: DC | PRN
  Administered 2012-07-10 – 2012-07-17 (×4): 650 mg via ORAL
  Filled 2012-07-10 (×3): qty 2
  Filled 2012-07-10: qty 1

## 2012-07-10 MED ORDER — ONDANSETRON HCL 4 MG/2ML IJ SOLN: 4.0000 mg | Freq: Four times a day (QID) | INTRAMUSCULAR | Status: DC | PRN

## 2012-07-10 MED ORDER — INSULIN GLARGINE 100 UNIT/ML ~~LOC~~ SOLN
10.0000 [IU] | Freq: Every day | SUBCUTANEOUS | Status: DC
  Administered 2012-07-10: 10 [IU] via SUBCUTANEOUS

## 2012-07-10 MED ORDER — FERROUS SULFATE 325 (65 FE) MG PO TABS
325.0000 mg | ORAL_TABLET | Freq: Three times a day (TID) | ORAL | Status: DC
  Administered 2012-07-11 – 2012-07-12 (×2): 325 mg via ORAL
  Filled 2012-07-10 (×9): qty 1

## 2012-07-10 MED ORDER — AMLODIPINE BESYLATE 10 MG PO TABS
10.0000 mg | ORAL_TABLET | Freq: Every day | ORAL | Status: DC
  Administered 2012-07-11 – 2012-07-18 (×7): 10 mg via ORAL
  Filled 2012-07-10 (×11): qty 1

## 2012-07-10 MED ORDER — GLYBURIDE 5 MG PO TABS
5.0000 mg | ORAL_TABLET | Freq: Two times a day (BID) | ORAL | Status: DC
Start: 2012-07-10 — End: 2012-07-10
  Filled 2012-07-10 (×2): qty 1

## 2012-07-10 NOTE — Interval H&P Note (Signed)
MONCHELLE ZACCAGNINO was admitted today to Inpatient Rehabilitation with the diagnosis of lumbar spondylosis with spondylolisthesis and radiculopathy s/p lumbar lami redo.  The patient's history has been reviewed, patient examined, and there is no change in status.  Patient continues to be appropriate for intensive inpatient rehabilitation.  I have reviewed the patient's chart and labs.  Questions were answered to the patient's satisfaction.  Malakai Schoenherr T 07/10/2012, 7:45 PM

## 2012-07-10 NOTE — H&P (View-Only) (Signed)
Physical Medicine and Rehabilitation Admission H&P    No chief complaint on file. : ZOX:WRUE S Voncannon is a 78 y.o. right-handed female with history of diabetes mellitus with peripheral neuropathy and decompressive lumbar laminectomy in 1994. Admitted 07/04/2012 with progressive low back pain radiating to the lower extremity. She denied any bowel or bladder disturbances. Lumbar myelogram demonstrated multilevel degenerative changes, stenosis spondylolisthesis with radiculopathy. Underwent redo laminectomy at lumbar L3-4 and L4-5 to decompress the bilateral L3, L4 and L5 nerve roots with posterior lumbar interbody fusion 07/04/2012 per Dr. Lovell Sheehan. Fitted with aspen lumbar fusion brace. Postoperative pain control. Acute blood loss anemia with latest hemoglobin 8.0 and monitored. Urine culture 07/08/2012 for low-grade temperature showed no growth and chest x-ray with no edema or effusions. Sequential compression devices were added for DVT prophylaxis. Physical and occupational therapy evaluations are pending. M.D. is requested physical medicine rehabilitation consult to consider inpatient rehabilitation    Review of Systems  Gastrointestinal: Positive for constipation.  Musculoskeletal: Positive for myalgias and back pain.  Neurological: Positive for weakness.  All other systems reviewed and are negative   Past Medical History  Diagnosis Date  . Hyperlipidemia   . Anxiety   . Diabetes mellitus   . Arthritis   . Anemia     hx of  . Cataract     left eye  . Chronic back pain   . Hypertension     sees Dr. Margret Chance, [facility] internal medicine  . Peripheral vascular disease   . Breast lump     hx of   Past Surgical History  Procedure Date  . Appendectomy   . Breast surgery     cyst removed left side  . Tonsillectomy   . Dilation and curettage of uterus   . Back surgery   . Colonoscopy   . Cardiovascular stress test    History reviewed. No pertinent family history. Social  History:  reports that she has never smoked. She does not have any smokeless tobacco history on file. She reports that she does not drink alcohol or use illicit drugs. Allergies:  Allergies  Allergen Reactions  . Januvia (Sitagliptin) Other (See Comments)    Numbness in mouth  . Simvastatin Other (See Comments)    tremors   Medications Prior to Admission  Medication Sig Dispense Refill  . amLODipine (NORVASC) 10 MG tablet Take 10 mg by mouth daily.      Marland Kitchen aspirin 81 MG tablet Take 81 mg by mouth daily.      . cetirizine (ZYRTEC) 10 MG tablet Take 10 mg by mouth daily.      . Ferrous Gluconate (IRON) 240 (27 FE) MG TABS Take 1 tablet by mouth daily.      . ferrous sulfate 325 (65 FE) MG tablet Take 325 mg by mouth daily with breakfast.      . glyBURIDE-metformin (GLUCOVANCE) 5-500 MG per tablet Take 1 tablet by mouth 2 (two) times daily with a meal.      . LORazepam (ATIVAN) 0.5 MG tablet Take 0.5 mg by mouth daily as needed. For anxiety      . rosuvastatin (CRESTOR) 10 MG tablet Take 5 mg by mouth daily.      Marland Kitchen telmisartan (MICARDIS) 40 MG tablet Take 40 mg by mouth daily.      . traMADol (ULTRAM) 50 MG tablet Take 50 mg by mouth every 6 (six) hours as needed.      . Vitamin D, Ergocalciferol, (DRISDOL) 50000 UNITS CAPS Take  50,000 Units by mouth every 14 (fourteen) days.        Home: Home Living Lives With: Alone;Daughter (lives alone but staying with daughter post surgery) Available Help at Discharge: Family;Available 24 hours/day Type of Home: House Home Access: Level entry Home Layout: Two level;Able to live on main level with bedroom/bathroom Bathroom Shower/Tub: Health visitor: Standard Home Adaptive Equipment: Straight cane;Grab bars in shower;Built-in shower seat;Bedside commode/3-in-1 Additional Comments: Most information was gathered from pt's daughter due to pt's low level of arousal. This information is for pt's daughter's home, which is where pt will be  going after surgery.   Functional History: Prior Function Able to Take Stairs?: Yes Driving: Yes Vocation: Retired Comments: Pt. prefers tub baths and sponge baths to showering  Functional Status:  Mobility: Bed Mobility Bed Mobility: Not assessed Rolling Right: 4: Min assist;With rail Right Sidelying to Sit: 4: Min assist;With rails Right Sidelying to Sit: Patient Percentage: 40% Sit to Sidelying Right: 4: Min assist;With rail Scooting to HOB: 1: +2 Total assist Scooting to [facility]: Patient Percentage: 10% Transfers Transfers: Sit to Stand;Stand to Sit Sit to Stand: 4: Min assist;With upper extremity assist;From bed;With armrests;From chair/3-in-1 Sit to Stand: Patient Percentage: 60% Stand to Sit: 4: Min assist;With upper extremity assist;To chair/3-in-1;To bed;With armrests Stand to Sit: Patient Percentage: 30% Stand Pivot Transfers: 1: +2 Total assist Stand Pivot Transfers: Patient Percentage: 60% Ambulation/Gait Ambulation/Gait Assistance: 4: Min assist Ambulation/Gait: Patient Percentage: 60% Ambulation Distance (Feet): 190 Feet Assistive device: Rolling walker Ambulation/Gait Assistance Details: Max cues for RW positioning and A with RW placement and to avoid obstacles as patient running into wall and objects in hallway. Cues for upright opsture and to look forward Gait Pattern: Step-to pattern;Decreased step length - right;Decreased step length - left;Trunk flexed Gait velocity: decreased gait speed Stairs: No    ADL: ADL Eating/Feeding: Performed;+1 Total assistance Where Assessed - Eating/Feeding: Chair Grooming: Simulated;Wash/dry face;Wash/dry hands;Maximal assistance Where Assessed - Grooming: Supported sitting Upper Body Bathing: Simulated;+1 Total assistance Where Assessed - Upper Body Bathing: Supported sitting Lower Body Bathing: Simulated;+1 Total assistance Where Assessed - Lower Body Bathing: Supported sit to stand Upper Body Dressing: Performed;Maximal  assistance Where Assessed - Upper Body Dressing: Supported sitting Lower Body Dressing: Simulated;+1 Total assistance Where Assessed - Lower Body Dressing: Supported sit to Pharmacist, hospital: Performed;+2 Total assistance Toilet Transfer Method: Sit to stand (ambulating) Acupuncturist: Grab bars;Comfort height toilet Equipment Used: Back brace;Rolling walker Transfers/Ambulation Related to ADLs: Assist to steer RW and for safety. Pt requires significantly increased time. ADL Comments: Pt very lethargic and required max cueing to remain awake.  Cognition: Cognition Arousal/Alertness: Awake/alert Orientation Level: Oriented X4 Cognition Overall Cognitive Status: Appears within functional limits for tasks assessed/performed Area of Impairment: Attention;Memory;Following commands Arousal/Alertness: Awake/alert Orientation Level: Appears intact for tasks assessed Behavior During Session: [facility] for tasks performed Current Attention Level: Focused Memory: Decreased recall of precautions Memory Deficits: Likely due to lethargy Following Commands: Follows one step commands inconsistently Cognition - Other Comments: Patient sleepy but able to participate.    Blood pressure 130/78, pulse 83, temperature 98.1 F (36.7 C), temperature source Oral, resp. rate 18, height 5\' 8"  (1.727 m), weight 101.152 kg (223 lb), SpO2 95.00%. Physical Exam  Vitals reviewed.  Constitutional: She is oriented to person. Thinks she's at daughter's house. She appears well-developed. Obese.  HENT:  Head: Normocephalic.  Eyes:  Pupils round and reactive to light  Neck: Neck supple. No thyromegaly present.  Cardiovascular: Normal rate,  regular rhythm and normal heart sounds.  Pulmonary/Chest: Effort normal and breath sounds normal. No respiratory distress. She has no wheezes.  Occasional cough, rhonchi  Abdominal: Bowel sounds are normal. She exhibits no distension. There is no tenderness.    Musculoskeletal: She exhibits no edema.  Neurological: She is alert and oriented to person, place, and time.  Follows commands but slow to arouse. Moves UE 3-4/5, LE 2-3/5 but inconsistent. No gross sensory deficits. dtr's grossly 1+. No gross sensory findings Skin:  Back incision is dressed. Brace un-done in bed. Psychiatric: She has a normal mood and affect. Her behavior is normal   Results for orders placed during the hospital encounter of 07/04/12 (from the past 48 hour(s))  GLUCOSE, CAPILLARY     Status: Abnormal   Collection Time   07/08/12  8:05 AM      Component Value Range Comment   Glucose-Capillary 148 (*) 70 - 99 mg/dL   GLUCOSE, CAPILLARY     Status: Abnormal   Collection Time   07/08/12 11:44 AM      Component Value Range Comment   Glucose-Capillary 243 (*) 70 - 99 mg/dL   CBC WITH DIFFERENTIAL     Status: Abnormal   Collection Time   07/08/12 12:47 PM      Component Value Range Comment   WBC 10.9 (*) 4.0 - 10.5 K/uL    RBC 3.17 (*) 3.87 - 5.11 MIL/uL    Hemoglobin 8.8 (*) 12.0 - 15.0 g/dL    HCT 56.2 (*) 13.0 - 46.0 %    MCV 82.0  78.0 - 100.0 fL    MCH 27.8  26.0 - 34.0 pg    MCHC 33.8  30.0 - 36.0 g/dL    RDW 86.5  78.4 - 69.6 %    Platelets 221  150 - 400 K/uL    Neutrophils Relative 83 (*) 43 - 77 %    Neutro Abs 9.1 (*) 1.7 - 7.7 K/uL    Lymphocytes Relative 9 (*) 12 - 46 %    Lymphs Abs 0.9  0.7 - 4.0 K/uL    Monocytes Relative 7  3 - 12 %    Monocytes Absolute 0.8  0.1 - 1.0 K/uL    Eosinophils Relative 1  0 - 5 %    Eosinophils Absolute 0.1  0.0 - 0.7 K/uL    Basophils Relative 0  0 - 1 %    Basophils Absolute 0.0  0.0 - 0.1 K/uL   COMPREHENSIVE METABOLIC PANEL     Status: Abnormal   Collection Time   07/08/12 12:47 PM      Component Value Range Comment   Sodium 131 (*) 135 - 145 mEq/L    Potassium 4.0  3.5 - 5.1 mEq/L    Chloride 96  96 - 112 mEq/L    CO2 22  19 - 32 mEq/L    Glucose, Bld 244 (*) 70 - 99 mg/dL    BUN 12  6 - 23 mg/dL     Creatinine, Ser 2.95  0.50 - 1.10 mg/dL    Calcium 9.1  8.4 - 28.4 mg/dL    Total Protein 7.4  6.0 - 8.3 g/dL    Albumin 2.8 (*) 3.5 - 5.2 g/dL    AST 17  0 - 37 U/L    ALT 10  0 - 35 U/L    Alkaline Phosphatase 114  39 - 117 U/L    Total Bilirubin 0.6  0.3 - 1.2 mg/dL  GFR calc non Af Amer 61 (*) >90 mL/min    GFR calc Af Amer 71 (*) >90 mL/min   URINALYSIS, ROUTINE W REFLEX MICROSCOPIC     Status: Abnormal   Collection Time   07/08/12  2:00 PM      Component Value Range Comment   Color, Urine YELLOW  YELLOW    APPearance CLEAR  CLEAR    Specific Gravity, Urine 1.018  1.005 - 1.030    pH 5.5  5.0 - 8.0    Glucose, UA 250 (*) NEGATIVE mg/dL    Hgb urine dipstick SMALL (*) NEGATIVE    Bilirubin Urine NEGATIVE  NEGATIVE    Ketones, ur 40 (*) NEGATIVE mg/dL    Protein, ur 960 (*) NEGATIVE mg/dL    Urobilinogen, UA 1.0  0.0 - 1.0 mg/dL    Nitrite NEGATIVE  NEGATIVE    Leukocytes, UA SMALL (*) NEGATIVE   URINE CULTURE     Status: Normal   Collection Time   07/08/12  2:00 PM      Component Value Range Comment   Specimen Description URINE, CATHETERIZED      Special Requests ADDED ON 07/08/12 AT 1645      Culture  Setup Time 07/08/2012 23:27      Colony Count NO GROWTH      Culture NO GROWTH      Report Status 07/10/2012 FINAL     URINE MICROSCOPIC-ADD ON     Status: Abnormal   Collection Time   07/08/12  2:00 PM      Component Value Range Comment   Squamous Epithelial / LPF FEW (*) RARE    WBC, UA 7-10  <3 WBC/hpf    RBC / HPF 3-6  <3 RBC/hpf    Bacteria, UA RARE  RARE   CULTURE, BLOOD (ROUTINE X 2)     Status: Normal (Preliminary result)   Collection Time   07/08/12  2:10 PM      Component Value Range Comment   Specimen Description BLOOD LEFT ARM      Special Requests BOTTLES DRAWN AEROBIC AND ANAEROBIC 10CC      Culture  Setup Time 07/08/2012 23:24      Culture        Value:        BLOOD CULTURE RECEIVED NO GROWTH TO DATE CULTURE WILL BE HELD FOR 5 DAYS BEFORE ISSUING A FINAL  NEGATIVE REPORT   Report Status PENDING     CULTURE, BLOOD (ROUTINE X 2)     Status: Normal (Preliminary result)   Collection Time   07/08/12  2:20 PM      Component Value Range Comment   Specimen Description BLOOD LEFT HAND      Special Requests BOTTLES DRAWN AEROBIC AND ANAEROBIC 10CC      Culture  Setup Time 07/08/2012 23:23      Culture        Value:        BLOOD CULTURE RECEIVED NO GROWTH TO DATE CULTURE WILL BE HELD FOR 5 DAYS BEFORE ISSUING A FINAL NEGATIVE REPORT   Report Status PENDING     GLUCOSE, CAPILLARY     Status: Abnormal   Collection Time   07/08/12  4:35 PM      Component Value Range Comment   Glucose-Capillary 187 (*) 70 - 99 mg/dL   HEMOGLOBIN A5W     Status: Abnormal   Collection Time   07/08/12  6:30 PM      Component Value  Range Comment   Hemoglobin A1C 8.4 (*) <5.7 %    Mean Plasma Glucose 194 (*) <117 mg/dL   GLUCOSE, CAPILLARY     Status: Abnormal   Collection Time   07/08/12  7:37 PM      Component Value Range Comment   Glucose-Capillary 171 (*) 70 - 99 mg/dL    Comment 1 Notify RN      Comment 2 Documented in Chart     GLUCOSE, CAPILLARY     Status: Abnormal   Collection Time   07/09/12 12:34 AM      Component Value Range Comment   Glucose-Capillary 154 (*) 70 - 99 mg/dL   CBC WITH DIFFERENTIAL     Status: Abnormal   Collection Time   07/09/12  5:25 AM      Component Value Range Comment   WBC 9.5  4.0 - 10.5 K/uL    RBC 2.72 (*) 3.87 - 5.11 MIL/uL    Hemoglobin 7.4 (*) 12.0 - 15.0 g/dL    HCT 10.2 (*) 72.5 - 46.0 %    MCV 83.1  78.0 - 100.0 fL    MCH 27.2  26.0 - 34.0 pg    MCHC 32.7  30.0 - 36.0 g/dL    RDW 36.6  44.0 - 34.7 %    Platelets 206  150 - 400 K/uL    Neutrophils Relative 74  43 - 77 %    Neutro Abs 7.0  1.7 - 7.7 K/uL    Lymphocytes Relative 14  12 - 46 %    Lymphs Abs 1.3  0.7 - 4.0 K/uL    Monocytes Relative 9  3 - 12 %    Monocytes Absolute 0.9  0.1 - 1.0 K/uL    Eosinophils Relative 3  0 - 5 %    Eosinophils Absolute 0.3  0.0 -  0.7 K/uL    Basophils Relative 0  0 - 1 %    Basophils Absolute 0.0  0.0 - 0.1 K/uL   COMPREHENSIVE METABOLIC PANEL     Status: Abnormal   Collection Time   07/09/12  5:25 AM      Component Value Range Comment   Sodium 135  135 - 145 mEq/L    Potassium 4.0  3.5 - 5.1 mEq/L    Chloride 101  96 - 112 mEq/L    CO2 25  19 - 32 mEq/L    Glucose, Bld 186 (*) 70 - 99 mg/dL    BUN 15  6 - 23 mg/dL    Creatinine, Ser 4.25  0.50 - 1.10 mg/dL    Calcium 8.6  8.4 - 95.6 mg/dL    Total Protein 6.5  6.0 - 8.3 g/dL    Albumin 2.3 (*) 3.5 - 5.2 g/dL    AST 15  0 - 37 U/L    ALT 8  0 - 35 U/L    Alkaline Phosphatase 90  39 - 117 U/L    Total Bilirubin 0.5  0.3 - 1.2 mg/dL    GFR calc non Af Amer 51 (*) >90 mL/min    GFR calc Af Amer 59 (*) >90 mL/min   GLUCOSE, CAPILLARY     Status: Abnormal   Collection Time   07/09/12  6:44 AM      Component Value Range Comment   Glucose-Capillary 166 (*) 70 - 99 mg/dL    Comment 1 Notify RN      Comment 2 Documented in Chart  CBC     Status: Abnormal   Collection Time   07/09/12  9:00 AM      Component Value Range Comment   WBC 9.6  4.0 - 10.5 K/uL    RBC 2.92 (*) 3.87 - 5.11 MIL/uL    Hemoglobin 8.0 (*) 12.0 - 15.0 g/dL    HCT 62.9 (*) 52.8 - 46.0 %    MCV 84.9  78.0 - 100.0 fL    MCH 27.4  26.0 - 34.0 pg    MCHC 32.3  30.0 - 36.0 g/dL    RDW 41.3  24.4 - 01.0 %    Platelets 239  150 - 400 K/uL   GLUCOSE, CAPILLARY     Status: Abnormal   Collection Time   07/09/12 11:40 AM      Component Value Range Comment   Glucose-Capillary 236 (*) 70 - 99 mg/dL   GLUCOSE, CAPILLARY     Status: Abnormal   Collection Time   07/09/12  4:46 PM      Component Value Range Comment   Glucose-Capillary 186 (*) 70 - 99 mg/dL   GLUCOSE, CAPILLARY     Status: Abnormal   Collection Time   07/09/12 10:10 PM      Component Value Range Comment   Glucose-Capillary 185 (*) 70 - 99 mg/dL    Dg Chest Port 1 View  07/08/2012  *RADIOLOGY REPORT*  Clinical Data: Chest pain.   PORTABLE CHEST - 1 VIEW  Comparison: 07/07/2012.  Findings: The heart is enlarged but stable.  There are bronchitic type lung changes and mild central vascular congestion.  No edema or effusions.  The bony thorax is intact.  IMPRESSION: Cardiac enlargement and mild vascular congestion. Streaky bibasilar atelectasis, slightly improved on the left.   Original Report Authenticated By: P. Loralie Champagne, M.D.     Post Admission Physician Evaluation: 1. Functional deficits secondary  to Lumbar spondylolisthesis, stenosis with radiculopathy. Status post redo laminectomy lumbar L3-4 and 4-5 with posterior lumbar interbody fusion. 2. Patient is admitted to receive collaborative, interdisciplinary care between the physiatrist, rehab nursing staff, and therapy team. 3. Patient's level of medical complexity and substantial therapy needs in context of that medical necessity cannot be provided at a lesser intensity of care such as a SNF. 4. Patient has experienced substantial functional loss from his/her baseline which was documented above under the "Functional History" and "Functional Status" headings.  Judging by the patient's diagnosis, physical exam, and functional history, the patient has potential for functional progress which will result in measurable gains while on inpatient rehab.  These gains will be of substantial and practical use upon discharge  in facilitating mobility and self-care at the household level. 5. Physiatrist will provide 24 hour management of medical needs as well as oversight of the therapy plan/treatment and provide guidance as appropriate regarding the interaction of the two. 6. 24 hour rehab nursing will assist with bladder management, bowel management, safety, skin/wound care, disease management, medication administration, pain management and patient education  and help integrate therapy concepts, techniques,education, etc. 7. PT will assess and treat for:  fxnl mobility, safety, back  precautions, lower extremity strength.  Goals are: supervision to mod I. 8. OT will assess and treat for: ADL's, fxnl mobility, safety, adaptive equipment, family ed.   Goals are: mod I to min assist. 9. SLP will assess and treat for: n/a.  Goals are: n/a. 10. Case Management and Social Worker will assess and treat for psychological issues and discharge planning.  11. Team conference will be held weekly to assess progress toward goals and to determine barriers to discharge. 12. Patient will receive at least 3 hours of therapy per day at least 5 days per week. 13. ELOS: 10-14 days      Prognosis:  good   Medical Problem List and Plan: 1. Lumbar spondylolisthesis, stenosis with radiculopathy. Status post redo laminectomy lumbar L3-4 and 4-5 with posterior lumbar interbody fusion 07/04/2012 2. DVT Prophylaxis/Anticoagulation: SCDs. Monitor for any signs of DVT.  3. Pain Management: Norco as needed. Monitor with increased mobility 4. Neuropsych: This patient is capable of making decisions on his/her own behalf. 5. Postoperative anemia. Continue iron supplement. Followup CBC 6. Diabetes mellitus. Hemoglobin A1c 8.4. Currently on Lantus insulin 10 units each bedtime. Glyburide/5 mg twice a day , Glucophage 500 mg twice a day. Check blood sugars a.c. and at bedtime and resume oral agents as tolerated 7. Hypertension. Norvasc 10 mg daily, Avapro 150 mg daily. Monitor with increased mobility    07/10/2012, Ivory Broad, MD

## 2012-07-10 NOTE — Plan of Care (Signed)
Overall Plan of Care [facility]) Patient Details Name: ROSEZELLA KRONICK MRN: [id number] DOB: January 07, 1934  Diagnosis: lumbar stenosis/spondylolisthesis, radiculopathy s/p lami redo   Primary Diagnosis:    Radiculopathy Co-morbidities: anxiety, dm, anemia, htn  Functional Problem List  Patient demonstrates impairments in the following areas: Balance, Bladder, Bowel, Edema, Endurance, Medication Management, Pain, Safety and Skin Integrity  Basic ADL's: grooming, bathing, dressing and toileting Advanced ADL's: simple meal preparation  Transfers:  bed mobility, bed to chair, toilet, tub/shower, car and furniture Locomotion:  ambulation and stairs  Additional Impairments:  Leisure Awareness  Anticipated Outcomes Item Anticipated Outcome  Eating/Swallowing    Basic self-care  Supervision  Tolieting  Supervision  Bowel/Bladder   Continent of bowel and bladder  Transfers  Mod I , supervision car  Locomotion  Mod I gait, S stairs  Communication    Cognition    Pain  Less or equal to 2  Safety/Judgment  Free of fall  Other     Therapy Plan: PT Frequency: 1-2 X/day, 60-90 minutes OT Frequency: 1-2 X/day, 60-90 minutes     Team Interventions: Item RN PT OT SLP SW TR Other  Self Care/Advanced ADL Retraining  x x      Neuromuscular Re-Education  x x      Therapeutic Activities  x x      UE/LE Strength Training/ROM  x x      UE/LE Coordination Activities  x x      Visual/Perceptual Remediation/Compensation         DME/Adaptive Equipment Instruction  x x      Therapeutic Exercise  x x      Balance/Vestibular Training  x x      Patient/Family Education x x x      Cognitive Remediation/Compensation         Functional Mobility Training  x x      Ambulation/Gait Training  x x      Stair Training  x       Wheelchair Propulsion/Positioning  x x      Functional Tourist information centre manager Reintegration  x x      Dysphagia/Aspiration Chemical engineer         Bladder Management x        Bowel Management x        Disease Management/Prevention         Pain Management x x       Medication Management x        Skin Care/Wound Management x x x      Splinting/Orthotics  x x      Discharge Planning x x x      Psychosocial Support  x x                         Team Discharge Planning: Destination:  Daughters Home Projected Follow-up:  PT, Outpatient and OT: TBD Projected Equipment Needs:  Bedside Commode, Shower Seat, Tub Bench and AD TBD Patient/family involved in discharge planning:  Yes  MD ELOS: 7-10 days Medical Rehab Prognosis:  Excellent Assessment: Pt is admitted for CIR therapies. The team will be addressing pain, ROM, back precautions, safety, lower and UE strength, balance, ADL's. Goals are supervision to mod I. Observe for effects of anemia in performance. pt's family is quite involved.

## 2012-07-10 NOTE — Progress Notes (Signed)
Pt arrived to unit around 1830. Family at bedside. Education given to family and pt about rehab and process. Booklet reviewed and safety plan signed by daughter. Pt resting and all bell in reach.

## 2012-07-10 NOTE — Consult Note (Signed)
Subjective: Patient fully awake and states that she's feeling well. Expected to be discharged to rehabilitation in the next 24 hours.  Interval history: Urine cultures negative so far.  Objective: Filed Vitals:   07/10/12 0200 07/10/12 0600 07/10/12 1000 07/10/12 1400  BP: 130/78 153/68 144/62 118/43  Pulse: 83 94 83 89  Temp: 98.1 F (36.7 C) 99.8 F (37.7 C) 99.2 F (37.3 C) 98.6 F (37 C)  TempSrc:   Oral Oral  Resp: 18 18 18 18   Height:      Weight:      SpO2: 95% 96% 99% 95%   Weight change:   Intake/Output Summary (Last 24 hours) at 07/10/12 1851 Last data filed at 07/10/12 0007  Gross per 24 hour  Intake    123 ml  Output    800 ml  Net   -677 ml    General: Alert, awake, oriented x3, in no acute distress.  HEENT: [place]/AT PEERL, EOMI  Heart: Regular rate and rhythm, without murmurs, rubs, gallops.  Lungs: Clear to auscultation.  Abdomen: Soft, nontender, nondistended, positive bowel sounds, no masses no hepatosplenomegaly noted..  Neuro: No focal neurological deficits noted. Strength  equal and symmetrical in bilateral upper and lower extremities. Musculoskeletal: No warm swelling or erythema around joints, no spinal tenderness noted. Psychiatric: Patient alert and oriented x3, good insight and cognition.    Lab Results:  [facility] 07/09/12 0525 07/08/12 1247  NA 135 131*  K 4.0 4.0  CL 101 96  CO2 25 22  GLUCOSE 186* 244*  BUN 15 12  CREATININE 1.04 0.89  CALCIUM 8.6 9.1  MG -- --  PHOS -- --    Basename 07/09/12 0525 07/08/12 1247  AST 15 17  ALT 8 10  ALKPHOS 90 114  BILITOT 0.5 0.6  PROT 6.5 7.4  ALBUMIN 2.3* 2.8*   No results found for this basename: LIPASE:2,AMYLASE:2 in the last 72 hours  Basename 07/09/12 0900 07/09/12 0525 07/08/12 1247  WBC 9.6 9.5 --  NEUTROABS -- 7.0 9.1*  HGB 8.0* 7.4* --  HCT 24.8* 22.6* --  MCV 84.9 83.1 --  PLT 239 206 --   No results found for this basename: CKTOTAL:3,CKMB:3,CKMBINDEX:3,TROPONINI:3 in the  last 72 hours No components found with this basename: POCBNP:3 No results found for this basename: DDIMER:2 in the last 72 hours  Basename 07/08/12 1830  HGBA1C 8.4*   No results found for this basename: CHOL:2,HDL:2,LDLCALC:2,TRIG:2,CHOLHDL:2,LDLDIRECT:2 in the last 72 hours No results found for this basename: TSH,T4TOTAL,FREET3,T3FREE,THYROIDAB in the last 72 hours No results found for this basename: VITAMINB12:2,FOLATE:2,FERRITIN:2,TIBC:2,IRON:2,RETICCTPCT:2 in the last 72 hours  Micro Results: Recent Results (from the past 240 hour(s))  SURGICAL PCR SCREEN     Status: Normal   Collection Time   07/03/12 12:05 PM      Component Value Range Status Comment   MRSA, PCR NEGATIVE  NEGATIVE Final    Staphylococcus aureus NEGATIVE  NEGATIVE Final   URINE CULTURE     Status: Normal   Collection Time   07/05/12  7:57 AM      Component Value Range Status Comment   Specimen Description URINE, CATHETERIZED   Final    Special Requests Normal   Final    Culture  Setup Time 07/05/2012 08:41   Final    Colony Count NO GROWTH   Final    Culture NO GROWTH   Final    Report Status 07/06/2012 FINAL   Final   CULTURE, BLOOD (ROUTINE X 2)  Status: Normal (Preliminary result)   Collection Time   07/05/12  9:05 AM      Component Value Range Status Comment   Specimen Description BLOOD ARM LEFT   Final    Special Requests BOTTLES DRAWN AEROBIC AND ANAEROBIC 10CC   Final    Culture  Setup Time 07/05/2012 13:40   Final    Culture     Final    Value:        BLOOD CULTURE RECEIVED NO GROWTH TO DATE CULTURE WILL BE HELD FOR 5 DAYS BEFORE ISSUING A FINAL NEGATIVE REPORT   Report Status PENDING   Incomplete   CULTURE, BLOOD (ROUTINE X 2)     Status: Normal (Preliminary result)   Collection Time   07/05/12  9:10 AM      Component Value Range Status Comment   Specimen Description BLOOD ARM LEFT   Final    Special Requests BOTTLES DRAWN AEROBIC AND ANAEROBIC 10CC   Final    Culture  Setup Time 07/05/2012  13:40   Final    Culture     Final    Value:        BLOOD CULTURE RECEIVED NO GROWTH TO DATE CULTURE WILL BE HELD FOR 5 DAYS BEFORE ISSUING A FINAL NEGATIVE REPORT   Report Status PENDING   Incomplete   URINE CULTURE     Status: Normal   Collection Time   07/08/12  2:00 PM      Component Value Range Status Comment   Specimen Description URINE, CATHETERIZED   Final    Special Requests ADDED ON 07/08/12 AT 1645   Final    Culture  Setup Time 07/08/2012 23:27   Final    Colony Count NO GROWTH   Final    Culture NO GROWTH   Final    Report Status 07/10/2012 FINAL   Final   CULTURE, BLOOD (ROUTINE X 2)     Status: Normal (Preliminary result)   Collection Time   07/08/12  2:10 PM      Component Value Range Status Comment   Specimen Description BLOOD LEFT ARM   Final    Special Requests BOTTLES DRAWN AEROBIC AND ANAEROBIC 10CC   Final    Culture  Setup Time 07/08/2012 23:24   Final    Culture     Final    Value:        BLOOD CULTURE RECEIVED NO GROWTH TO DATE CULTURE WILL BE HELD FOR 5 DAYS BEFORE ISSUING A FINAL NEGATIVE REPORT   Report Status PENDING   Incomplete   CULTURE, BLOOD (ROUTINE X 2)     Status: Normal (Preliminary result)   Collection Time   07/08/12  2:20 PM      Component Value Range Status Comment   Specimen Description BLOOD LEFT HAND   Final    Special Requests BOTTLES DRAWN AEROBIC AND ANAEROBIC 10CC   Final    Culture  Setup Time 07/08/2012 23:23   Final    Culture     Final    Value:        BLOOD CULTURE RECEIVED NO GROWTH TO DATE CULTURE WILL BE HELD FOR 5 DAYS BEFORE ISSUING A FINAL NEGATIVE REPORT   Report Status PENDING   Incomplete     Studies/Results: Dg Lumbar Spine 2-3 Views  07/04/2012  *RADIOLOGY REPORT*  Clinical Data: L3-L5 PLIF  LUMBAR SPINE - 2-3 VIEW  Comparison: Earlier same day  Findings: Two C-arm images show placement of pedicle screws  bilaterally at L3, L4 and L5.  There has been discectomy with placement of interbody fusion material, grossly well  positioned.  IMPRESSION: PLIF in progress L3-L5.   Original Report Authenticated By: Thomasenia Sales, M.D.    Dg Lumbar Spine 2-3 Views  07/04/2012  *RADIOLOGY REPORT*  Clinical Data: L3-4 and L4-5 PLIF  LUMBAR SPINE - 2-3 VIEW  Comparison: 03/29/2012  Findings: Initial lateral view shows a probe at the pedicle level of L4.  Second film shows tissue spreaders posteriorly with a probe posterior to the L3-4 disc level.  Third film confirms the probe posterior to the L3-4 disc level.  IMPRESSION: L3-4 disc level localized.   Original Report Authenticated By: Thomasenia Sales, M.D.    Ct Head Wo Contrast  07/07/2012  *RADIOLOGY REPORT*  Clinical Data: 78 year old female right side weakness.  CT HEAD WITHOUT CONTRAST  Technique:  Contiguous axial images were obtained from the base of the skull through the vertex without contrast.  Comparison: None.  Findings: Visualized paranasal sinuses and mastoids are clear.  No acute osseous abnormality identified.  Negative orbit and scalp soft tissues.  Calcified atherosclerosis at the skull base.  Dystrophic basal ganglia calcifications.  Prominent ventricles, but no definite ventriculomegaly; temporal horns remain fairly diminutive. No midline shift, mass effect, or evidence of mass lesion.  Confluent bilateral cerebral white matter hypodensity. No acute intracranial hemorrhage identified.  No evidence of cortically based acute infarction identified.  No suspicious intracranial vascular hyperdensity.  IMPRESSION: Dystrophic basal ganglia calcifications and moderate white matter changes suggestive of chronic small vessel disease. No acute intracranial abnormality.   Original Report Authenticated By: Harley Hallmark, M.D.    Dg Chest Port 1 View  07/08/2012  *RADIOLOGY REPORT*  Clinical Data: Chest pain.  PORTABLE CHEST - 1 VIEW  Comparison: 07/07/2012.  Findings: The heart is enlarged but stable.  There are bronchitic type lung changes and mild central vascular congestion.  No  edema or effusions.  The bony thorax is intact.  IMPRESSION: Cardiac enlargement and mild vascular congestion. Streaky bibasilar atelectasis, slightly improved on the left.   Original Report Authenticated By: P. Loralie Champagne, M.D.    Dg Chest Port 1 View  07/07/2012  *RADIOLOGY REPORT*  Clinical Data: 78 year old female with fever.  PORTABLE CHEST - 1 VIEW  Comparison: 07/05/2012.  Findings: Portable AP semi upright view 1815 hours.  Stable to slightly improved lung volumes.  Stable cardiac size and mediastinal contours.  Visualized tracheal air column is within normal limits.  No pneumothorax or pulmonary edema.  No pleural effusion.  Mild retrocardiac streaky opacity, favor atelectasis. Otherwise no confluent pulmonary opacity.  IMPRESSION: Retrocardiac streaky opacity, favor atelectasis.  No definite pneumonia.  If suspicion of pneumonia persists  PA and lateral views would be helpful when possible.   Original Report Authenticated By: Harley Hallmark, M.D.    Dg Chest Port 1 View  07/05/2012  *RADIOLOGY REPORT*  Clinical Data: Postoperative fever.  PORTABLE CHEST - 1 VIEW  Comparison: None.  Findings: Study is limited by portable technique and body habitus. No consolidative process, pneumothorax or effusion is identified. Heart size is upper normal.  IMPRESSION: No acute finding.   Original Report Authenticated By: Bernadene Bell. Maricela Curet, M.D.     Medications: I have reviewed the patient's current medications. Scheduled Meds:    . DISCONTD: amLODipine  10 mg Oral Daily  . DISCONTD: aspirin EC  81 mg Oral Daily  . DISCONTD: aspirin  81 mg Oral Daily  .  DISCONTD: cefTRIAXone (ROCEPHIN)  IV  1 g Intravenous Q24H  . DISCONTD: docusate sodium  100 mg Oral BID  . DISCONTD: ferrous sulfate  325 mg Oral TID WC  . DISCONTD: glyBURIDE  5 mg Oral BID WC  . DISCONTD: glyBURIDE-metformin  1 tablet Oral BID WC  . DISCONTD: insulin aspart  0-15 Units Subcutaneous TID WC  . DISCONTD: insulin aspart  0-5 Units  Subcutaneous QHS  . DISCONTD: insulin glargine  10 Units Subcutaneous QHS  . DISCONTD: irbesartan  150 mg Oral Daily  . DISCONTD: loratadine  10 mg Oral Daily  . DISCONTD: metFORMIN  500 mg Oral BID WC  . DISCONTD: metoprolol  5 mg Intravenous Once  . DISCONTD: pantoprazole  40 mg Oral QAC breakfast  . DISCONTD: polyethylene glycol  17 g Oral Daily  . DISCONTD: sodium chloride  3 mL Intravenous Q12H  . DISCONTD: Vitamin D (Ergocalciferol)  50,000 Units Oral Q14 Days   Continuous Infusions:  PRN Meds:.DISCONTD: acetaminophen, DISCONTD: acetaminophen, DISCONTD: acetaminophen, DISCONTD: acetaminophen, DISCONTD: bisacodyl, DISCONTD: HYDROcodone-acetaminophen, DISCONTD: LORazepam, DISCONTD: magnesium hydroxide, DISCONTD: menthol-cetylpyridinium, DISCONTD: ondansetron (ZOFRAN) IV, DISCONTD: phenol, DISCONTD: sodium phosphate Assessment/Plan: Patient Active Hospital Problem List: HTN (hypertension) (07/08/2012)   Assessment: Patient's blood pressure much better controlled now the blood pressure medications restarted. Continue current medication  Diabetes mellitus (07/08/2012)   Assessment: Blood sugars improved on current regimen. I will medications have been restarted by Dr. Lovell Sheehan today. I would discontinue the Lantus and monitor her on her metformin and Amaryl.    Encephalopathy acute (07/08/2012)   Assessment: Resolved.   Tachycardia (07/08/2012)   Assessment: Resolved    UTI (lower urinary tract infection) (07/08/2012)   Assessment: Patient on Rocephin day #3. Urine cultures negative. I will discontinue antibiotics.   Thank you for this consult. We'll sign off for now we can be of any further help please call.      LOS: 6 days

## 2012-07-10 NOTE — Progress Notes (Signed)
Patient ID: [name] Mathews, female   DOB: 03-08-1934, 78 y.o.   MRN: [id number] Subjective:  The patient is alert and pleasant. She looks much better today. She wants to go into rehabilitation. I spoke with the patient's daughter Velna Hatchet this morning.  Objective: Vital signs in last 24 hours: Temp:  [98.1 F (36.7 C)-99.9 F (37.7 C)] 99.8 F (37.7 C) (09/10 0600) Pulse Rate:  [82-94] 94  (09/10 0600) Resp:  [18] 18  (09/10 0600) BP: (130-153)/(62-78) 153/68 mmHg (09/10 0600) SpO2:  [95 %-98 %] 96 % (09/10 0600)  Intake/Output from previous day: 09/09 0701 - 09/10 0700 In: 123 [P.O.:120; I.V.:3] Out: 800 [Urine:800] Intake/Output this shift:    Physical exam patient is alert and oriented. Her strength is grossly normal in her lower extremities. Her wound continues to have blisters from the Steri-Strips. There is no signs of infection.  Lab Results:  Basename 07/09/12 0900 07/09/12 0525  WBC 9.6 9.5  HGB 8.0* 7.4*  HCT 24.8* 22.6*  PLT 239 206   BMET  Basename 07/09/12 0525 07/08/12 1247  NA 135 131*  K 4.0 4.0  CL 101 96  CO2 25 22  GLUCOSE 186* 244*  BUN 15 12  CREATININE 1.04 0.89  CALCIUM 8.6 9.1    Studies/Results: Dg Chest Port 1 View  07/08/2012  *RADIOLOGY REPORT*  Clinical Data: Chest pain.  PORTABLE CHEST - 1 VIEW  Comparison: 07/07/2012.  Findings: The heart is enlarged but stable.  There are bronchitic type lung changes and mild central vascular congestion.  No edema or effusions.  The bony thorax is intact.  IMPRESSION: Cardiac enlargement and mild vascular congestion. Streaky bibasilar atelectasis, slightly improved on the left.   Original Report Authenticated By: P. Loralie Champagne, M.D.     Assessment/Plan: Postop day #6: The patient is doing well neurologically and is ready to go into rehabilitation.  Hyponatremia: Resolved  Acute blood loss anemia: The patient's repeat hemoglobin was 8 yesterday. She is asymptomatic. She is on iron. She will need to  have her hemoglobin repeated in a few days.  Possible urine tract infection: The patient is on Rocephin empirically. I appreciate Dr. Ashley Royalty help with this.  Wound blisters: He looks like this is secondary to the adhesive and Steri-Strips. There is nothing to do about this presently.  LOS: 6 days     Jasiyah Poland D 07/10/2012, 7:49 AM

## 2012-07-10 NOTE — Progress Notes (Signed)
Rehab admissions - Patient now medically ready for inpatient rehab.  Bed available and can admit to inpatient rehab today.  Call me for questions.  #[phone]

## 2012-07-10 NOTE — H&P (Signed)
Physical Medicine and Rehabilitation Admission H&P    No chief complaint on file. : HPI:[name] Mathews is a 78 y.o. right-handed female with history of diabetes mellitus with peripheral neuropathy and decompressive lumbar laminectomy in 1994. Admitted 07/04/2012 with progressive low back pain radiating to the lower extremity. She denied any bowel or bladder disturbances. Lumbar myelogram demonstrated multilevel degenerative changes, stenosis spondylolisthesis with radiculopathy. Underwent redo laminectomy at lumbar L3-4 and L4-5 to decompress the bilateral L3, L4 and L5 nerve roots with posterior lumbar interbody fusion 07/04/2012 per Dr. Jenkins. Fitted with aspen lumbar fusion brace. Postoperative pain control. Acute blood loss anemia with latest hemoglobin 8.0 and monitored. Urine culture 07/08/2012 for low-grade temperature showed no growth and chest x-ray with no edema or effusions. Sequential compression devices were added for DVT prophylaxis. Physical and occupational therapy evaluations are pending. M.D. is requested physical medicine rehabilitation consult to consider inpatient rehabilitation    Review of Systems  Gastrointestinal: Positive for constipation.  Musculoskeletal: Positive for myalgias and back pain.  Neurological: Positive for weakness.  All other systems reviewed and are negative   Past Medical History  Diagnosis Date  . Hyperlipidemia   . Anxiety   . Diabetes mellitus   . Arthritis   . Anemia     hx of  . Cataract     left eye  . Chronic back pain   . Hypertension     sees Dr. Christine Bounous, [facility]  . Peripheral vascular disease   . Breast lump     hx of   Past Surgical History  Procedure Date  . Appendectomy   . Breast surgery     cyst removed left side  . Tonsillectomy   . Dilation and curettage of uterus   . Back surgery   . Colonoscopy   . Cardiovascular stress test    History reviewed. No pertinent family history. Social  History:  reports that she has never smoked. She does not have any smokeless tobacco history on file. She reports that she does not drink alcohol or use illicit drugs. Allergies:  Allergies  Allergen Reactions  . Januvia (Sitagliptin) Other (See Comments)    Numbness in mouth  . Simvastatin Other (See Comments)    tremors   Medications Prior to Admission  Medication Sig Dispense Refill  . amLODipine (NORVASC) 10 MG tablet Take 10 mg by mouth daily.      . aspirin 81 MG tablet Take 81 mg by mouth daily.      . cetirizine (ZYRTEC) 10 MG tablet Take 10 mg by mouth daily.      . Ferrous Gluconate (IRON) 240 (27 FE) MG TABS Take 1 tablet by mouth daily.      . ferrous sulfate 325 (65 FE) MG tablet Take 325 mg by mouth daily with breakfast.      . glyBURIDE-metformin (GLUCOVANCE) 5-500 MG per tablet Take 1 tablet by mouth 2 (two) times daily with a meal.      . LORazepam (ATIVAN) 0.5 MG tablet Take 0.5 mg by mouth daily as needed. For anxiety      . rosuvastatin (CRESTOR) 10 MG tablet Take 5 mg by mouth daily.      . telmisartan (MICARDIS) 40 MG tablet Take 40 mg by mouth daily.      . traMADol (ULTRAM) 50 MG tablet Take 50 mg by mouth every 6 (six) hours as needed.      . Vitamin D, Ergocalciferol, (DRISDOL) 50000 UNITS CAPS Take   50,000 Units by mouth every 14 (fourteen) days.        Home: Home Living Lives With: Alone;Daughter (lives alone but staying with daughter post surgery) Available Help at Discharge: Family;Available 24 hours/day Type of Home: House Home Access: Level entry Home Layout: Two level;Able to live on main level with bedroom/bathroom Bathroom Shower/Tub: Walk-in shower Bathroom Toilet: Standard Home Adaptive Equipment: Straight cane;Grab bars in shower;Built-in shower seat;Bedside commode/3-in-1 Additional Comments: Most information was gathered from pt's daughter due to pt's low level of arousal. This information is for pt's daughter's home, which is where pt will be  going after surgery.   Functional History: Prior Function Able to Take Stairs?: Yes Driving: Yes Vocation: Retired Comments: Pt. prefers tub baths and sponge baths to showering  Functional Status:  Mobility: Bed Mobility Bed Mobility: Not assessed Rolling Right: 4: Min assist;With rail Right Sidelying to Sit: 4: Min assist;With rails Right Sidelying to Sit: Patient Percentage: 40% Sit to Sidelying Right: 4: Min assist;With rail Scooting to HOB: 1: +2 Total assist Scooting to HOB: Patient Percentage: 10% Transfers Transfers: Sit to Stand;Stand to Sit Sit to Stand: 4: Min assist;With upper extremity assist;From bed;With armrests;From chair/3-in-1 Sit to Stand: Patient Percentage: 60% Stand to Sit: 4: Min assist;With upper extremity assist;To chair/3-in-1;To bed;With armrests Stand to Sit: Patient Percentage: 30% Stand Pivot Transfers: 1: +2 Total assist Stand Pivot Transfers: Patient Percentage: 60% Ambulation/Gait Ambulation/Gait Assistance: 4: Min assist Ambulation/Gait: Patient Percentage: 60% Ambulation Distance (Feet): 190 Feet Assistive device: Rolling walker Ambulation/Gait Assistance Details: Max cues for RW positioning and A with RW placement and to avoid obstacles as patient running into wall and objects in hallway. Cues for upright opsture and to look forward Gait Pattern: Step-to pattern;Decreased step length - right;Decreased step length - left;Trunk flexed Gait velocity: decreased gait speed Stairs: No    ADL: ADL Eating/Feeding: Performed;+1 Total assistance Where Assessed - Eating/Feeding: Chair Grooming: Simulated;Wash/dry face;Wash/dry hands;Maximal assistance Where Assessed - Grooming: Supported sitting Upper Body Bathing: Simulated;+1 Total assistance Where Assessed - Upper Body Bathing: Supported sitting Lower Body Bathing: Simulated;+1 Total assistance Where Assessed - Lower Body Bathing: Supported sit to stand Upper Body Dressing: Performed;Maximal  assistance Where Assessed - Upper Body Dressing: Supported sitting Lower Body Dressing: Simulated;+1 Total assistance Where Assessed - Lower Body Dressing: Supported sit to stand Toilet Transfer: Performed;+2 Total assistance Toilet Transfer Method: Sit to stand (ambulating) Toilet Transfer Equipment: Grab bars;Comfort height toilet Equipment Used: Back brace;Rolling walker Transfers/Ambulation Related to ADLs: Assist to steer RW and for safety. Pt requires significantly increased time. ADL Comments: Pt very lethargic and required max cueing to remain awake.  Cognition: Cognition Arousal/Alertness: Awake/alert Orientation Level: Oriented X4 Cognition Overall Cognitive Status: Appears within functional limits for tasks assessed/performed Area of Impairment: Attention;Memory;Following commands Arousal/Alertness: Awake/alert Orientation Level: Appears intact for tasks assessed Behavior During Session: WFL for tasks performed Current Attention Level: Focused Memory: Decreased recall of precautions Memory Deficits: Likely due to lethargy Following Commands: Follows one step commands inconsistently Cognition - Other Comments: Patient sleepy but able to participate.    Blood pressure 130/78, pulse 83, temperature 98.1 F (36.7 C), temperature source Oral, resp. rate 18, height 5' 8" (1.727 m), weight 101.152 kg (223 lb), SpO2 95.00%. Physical Exam  Vitals reviewed.  Constitutional: She is oriented to person. Thinks she's at daughter's house. She appears well-developed. Obese.  HENT:  Head: Normocephalic.  Eyes:  Pupils round and reactive to light  Neck: Neck supple. No thyromegaly present.  Cardiovascular: Normal rate,   regular rhythm and normal heart sounds.  Pulmonary/Chest: Effort normal and breath sounds normal. No respiratory distress. She has no wheezes.  Occasional cough, rhonchi  Abdominal: Bowel sounds are normal. She exhibits no distension. There is no tenderness.    Musculoskeletal: She exhibits no edema.  Neurological: She is alert and oriented to person, place, and time.  Follows commands but slow to arouse. Moves UE 3-4/5, LE 2-3/5 but inconsistent. No gross sensory deficits. dtr's grossly 1+. No gross sensory findings Skin:  Back incision is dressed. Brace un-done in bed. Psychiatric: She has a normal mood and affect. Her behavior is normal   Results for orders placed during the hospital encounter of 07/04/12 (from the past 48 hour(s))  GLUCOSE, CAPILLARY     Status: Abnormal   Collection Time   07/08/12  8:05 AM      Component Value Range Comment   Glucose-Capillary 148 (*) 70 - 99 mg/dL   GLUCOSE, CAPILLARY     Status: Abnormal   Collection Time   07/08/12 11:44 AM      Component Value Range Comment   Glucose-Capillary 243 (*) 70 - 99 mg/dL   CBC WITH DIFFERENTIAL     Status: Abnormal   Collection Time   07/08/12 12:47 PM      Component Value Range Comment   WBC 10.9 (*) 4.0 - 10.5 K/uL    RBC 3.17 (*) 3.87 - 5.11 MIL/uL    Hemoglobin 8.8 (*) 12.0 - 15.0 g/dL    HCT 26.0 (*) 36.0 - 46.0 %    MCV 82.0  78.0 - 100.0 fL    MCH 27.8  26.0 - 34.0 pg    MCHC 33.8  30.0 - 36.0 g/dL    RDW 14.6  11.5 - 15.5 %    Platelets 221  150 - 400 K/uL    Neutrophils Relative 83 (*) 43 - 77 %    Neutro Abs 9.1 (*) 1.7 - 7.7 K/uL    Lymphocytes Relative 9 (*) 12 - 46 %    Lymphs Abs 0.9  0.7 - 4.0 K/uL    Monocytes Relative 7  3 - 12 %    Monocytes Absolute 0.8  0.1 - 1.0 K/uL    Eosinophils Relative 1  0 - 5 %    Eosinophils Absolute 0.1  0.0 - 0.7 K/uL    Basophils Relative 0  0 - 1 %    Basophils Absolute 0.0  0.0 - 0.1 K/uL   COMPREHENSIVE METABOLIC PANEL     Status: Abnormal   Collection Time   07/08/12 12:47 PM      Component Value Range Comment   Sodium 131 (*) 135 - 145 mEq/L    Potassium 4.0  3.5 - 5.1 mEq/L    Chloride 96  96 - 112 mEq/L    CO2 22  19 - 32 mEq/L    Glucose, Bld 244 (*) 70 - 99 mg/dL    BUN 12  6 - 23 mg/dL     Creatinine, Ser 0.89  0.50 - 1.10 mg/dL    Calcium 9.1  8.4 - 10.5 mg/dL    Total Protein 7.4  6.0 - 8.3 g/dL    Albumin 2.8 (*) 3.5 - 5.2 g/dL    AST 17  0 - 37 U/L    ALT 10  0 - 35 U/L    Alkaline Phosphatase 114  39 - 117 U/L    Total Bilirubin 0.6  0.3 - 1.2 mg/dL      GFR calc non Af Amer 61 (*) >90 mL/min    GFR calc Af Amer 71 (*) >90 mL/min   URINALYSIS, ROUTINE W REFLEX MICROSCOPIC     Status: Abnormal   Collection Time   07/08/12  2:00 PM      Component Value Range Comment   Color, Urine YELLOW  YELLOW    APPearance CLEAR  CLEAR    Specific Gravity, Urine 1.018  1.005 - 1.030    pH 5.5  5.0 - 8.0    Glucose, UA 250 (*) NEGATIVE mg/dL    Hgb urine dipstick SMALL (*) NEGATIVE    Bilirubin Urine NEGATIVE  NEGATIVE    Ketones, ur 40 (*) NEGATIVE mg/dL    Protein, ur 100 (*) NEGATIVE mg/dL    Urobilinogen, UA 1.0  0.0 - 1.0 mg/dL    Nitrite NEGATIVE  NEGATIVE    Leukocytes, UA SMALL (*) NEGATIVE   URINE CULTURE     Status: Normal   Collection Time   07/08/12  2:00 PM      Component Value Range Comment   Specimen Description URINE, CATHETERIZED      Special Requests ADDED ON 07/08/12 AT 1645      Culture  Setup Time 07/08/2012 23:27      Colony Count NO GROWTH      Culture NO GROWTH      Report Status 07/10/2012 FINAL     URINE MICROSCOPIC-ADD ON     Status: Abnormal   Collection Time   07/08/12  2:00 PM      Component Value Range Comment   Squamous Epithelial / LPF FEW (*) RARE    WBC, UA 7-10  <3 WBC/hpf    RBC / HPF 3-6  <3 RBC/hpf    Bacteria, UA RARE  RARE   CULTURE, BLOOD (ROUTINE X 2)     Status: Normal (Preliminary result)   Collection Time   07/08/12  2:10 PM      Component Value Range Comment   Specimen Description BLOOD LEFT ARM      Special Requests BOTTLES DRAWN AEROBIC AND ANAEROBIC 10CC      Culture  Setup Time 07/08/2012 23:24      Culture        Value:        BLOOD CULTURE RECEIVED NO GROWTH TO DATE CULTURE WILL BE HELD FOR 5 DAYS BEFORE ISSUING A FINAL  NEGATIVE REPORT   Report Status PENDING     CULTURE, BLOOD (ROUTINE X 2)     Status: Normal (Preliminary result)   Collection Time   07/08/12  2:20 PM      Component Value Range Comment   Specimen Description BLOOD LEFT HAND      Special Requests BOTTLES DRAWN AEROBIC AND ANAEROBIC 10CC      Culture  Setup Time 07/08/2012 23:23      Culture        Value:        BLOOD CULTURE RECEIVED NO GROWTH TO DATE CULTURE WILL BE HELD FOR 5 DAYS BEFORE ISSUING A FINAL NEGATIVE REPORT   Report Status PENDING     GLUCOSE, CAPILLARY     Status: Abnormal   Collection Time   07/08/12  4:35 PM      Component Value Range Comment   Glucose-Capillary 187 (*) 70 - 99 mg/dL   HEMOGLOBIN A1C     Status: Abnormal   Collection Time   07/08/12  6:30 PM      Component Value   Range Comment   Hemoglobin A1C 8.4 (*) <5.7 %    Mean Plasma Glucose 194 (*) <117 mg/dL   GLUCOSE, CAPILLARY     Status: Abnormal   Collection Time   07/08/12  7:37 PM      Component Value Range Comment   Glucose-Capillary 171 (*) 70 - 99 mg/dL    Comment 1 Notify RN      Comment 2 Documented in Chart     GLUCOSE, CAPILLARY     Status: Abnormal   Collection Time   07/09/12 12:34 AM      Component Value Range Comment   Glucose-Capillary 154 (*) 70 - 99 mg/dL   CBC WITH DIFFERENTIAL     Status: Abnormal   Collection Time   07/09/12  5:25 AM      Component Value Range Comment   WBC 9.5  4.0 - 10.5 K/uL    RBC 2.72 (*) 3.87 - 5.11 MIL/uL    Hemoglobin 7.4 (*) 12.0 - 15.0 g/dL    HCT 22.6 (*) 36.0 - 46.0 %    MCV 83.1  78.0 - 100.0 fL    MCH 27.2  26.0 - 34.0 pg    MCHC 32.7  30.0 - 36.0 g/dL    RDW 14.9  11.5 - 15.5 %    Platelets 206  150 - 400 K/uL    Neutrophils Relative 74  43 - 77 %    Neutro Abs 7.0  1.7 - 7.7 K/uL    Lymphocytes Relative 14  12 - 46 %    Lymphs Abs 1.3  0.7 - 4.0 K/uL    Monocytes Relative 9  3 - 12 %    Monocytes Absolute 0.9  0.1 - 1.0 K/uL    Eosinophils Relative 3  0 - 5 %    Eosinophils Absolute 0.3  0.0 -  0.7 K/uL    Basophils Relative 0  0 - 1 %    Basophils Absolute 0.0  0.0 - 0.1 K/uL   COMPREHENSIVE METABOLIC PANEL     Status: Abnormal   Collection Time   07/09/12  5:25 AM      Component Value Range Comment   Sodium 135  135 - 145 mEq/L    Potassium 4.0  3.5 - 5.1 mEq/L    Chloride 101  96 - 112 mEq/L    CO2 25  19 - 32 mEq/L    Glucose, Bld 186 (*) 70 - 99 mg/dL    BUN 15  6 - 23 mg/dL    Creatinine, Ser 1.04  0.50 - 1.10 mg/dL    Calcium 8.6  8.4 - 10.5 mg/dL    Total Protein 6.5  6.0 - 8.3 g/dL    Albumin 2.3 (*) 3.5 - 5.2 g/dL    AST 15  0 - 37 U/L    ALT 8  0 - 35 U/L    Alkaline Phosphatase 90  39 - 117 U/L    Total Bilirubin 0.5  0.3 - 1.2 mg/dL    GFR calc non Af Amer 51 (*) >90 mL/min    GFR calc Af Amer 59 (*) >90 mL/min   GLUCOSE, CAPILLARY     Status: Abnormal   Collection Time   07/09/12  6:44 AM      Component Value Range Comment   Glucose-Capillary 166 (*) 70 - 99 mg/dL    Comment 1 Notify RN      Comment 2 Documented in Chart       CBC     Status: Abnormal   Collection Time   07/09/12  9:00 AM      Component Value Range Comment   WBC 9.6  4.0 - 10.5 K/uL    RBC 2.92 (*) 3.87 - 5.11 MIL/uL    Hemoglobin 8.0 (*) 12.0 - 15.0 g/dL    HCT 24.8 (*) 36.0 - 46.0 %    MCV 84.9  78.0 - 100.0 fL    MCH 27.4  26.0 - 34.0 pg    MCHC 32.3  30.0 - 36.0 g/dL    RDW 15.0  11.5 - 15.5 %    Platelets 239  150 - 400 K/uL   GLUCOSE, CAPILLARY     Status: Abnormal   Collection Time   07/09/12 11:40 AM      Component Value Range Comment   Glucose-Capillary 236 (*) 70 - 99 mg/dL   GLUCOSE, CAPILLARY     Status: Abnormal   Collection Time   07/09/12  4:46 PM      Component Value Range Comment   Glucose-Capillary 186 (*) 70 - 99 mg/dL   GLUCOSE, CAPILLARY     Status: Abnormal   Collection Time   07/09/12 10:10 PM      Component Value Range Comment   Glucose-Capillary 185 (*) 70 - 99 mg/dL    Dg Chest Port 1 View  07/08/2012  *RADIOLOGY REPORT*  Clinical Data: Chest pain.   PORTABLE CHEST - 1 VIEW  Comparison: 07/07/2012.  Findings: The heart is enlarged but stable.  There are bronchitic type lung changes and mild central vascular congestion.  No edema or effusions.  The bony thorax is intact.  IMPRESSION: Cardiac enlargement and mild vascular congestion. Streaky bibasilar atelectasis, slightly improved on the left.   Original Report Authenticated By: P. MARK GALLERANI, M.D.     Post Admission Physician Evaluation: 1. Functional deficits secondary  to Lumbar spondylolisthesis, stenosis with radiculopathy. Status post redo laminectomy lumbar L3-4 and 4-5 with posterior lumbar interbody fusion. 2. Patient is admitted to receive collaborative, interdisciplinary care between the physiatrist, rehab nursing staff, and therapy team. 3. Patient's level of medical complexity and substantial therapy needs in context of that medical necessity cannot be provided at a lesser intensity of care such as a SNF. 4. Patient has experienced substantial functional loss from his/her baseline which was documented above under the "Functional History" and "Functional Status" headings.  Judging by the patient's diagnosis, physical exam, and functional history, the patient has potential for functional progress which will result in measurable gains while on inpatient rehab.  These gains will be of substantial and practical use upon discharge  in facilitating mobility and self-care at the household level. 5. Physiatrist will provide 24 hour management of medical needs as well as oversight of the therapy plan/treatment and provide guidance as appropriate regarding the interaction of the two. 6. 24 hour rehab nursing will assist with bladder management, bowel management, safety, skin/wound care, disease management, medication administration, pain management and patient education  and help integrate therapy concepts, techniques,education, etc. 7. PT will assess and treat for:  fxnl mobility, safety, back  precautions, lower extremity strength.  Goals are: supervision to mod I. 8. OT will assess and treat for: ADL's, fxnl mobility, safety, adaptive equipment, family ed.   Goals are: mod I to min assist. 9. SLP will assess and treat for: n/a.  Goals are: n/a. 10. Case Management and Social Worker will assess and treat for psychological issues and discharge planning.   11. Team conference will be held weekly to assess progress toward goals and to determine barriers to discharge. 12. Patient will receive at least 3 hours of therapy per day at least 5 days per week. 13. ELOS: 10-14 days      Prognosis:  good   Medical Problem List and Plan: 1. Lumbar spondylolisthesis, stenosis with radiculopathy. Status post redo laminectomy lumbar L3-4 and 4-5 with posterior lumbar interbody fusion 07/04/2012 2. DVT Prophylaxis/Anticoagulation: SCDs. Monitor for any signs of DVT.  3. Pain Management: Norco as needed. Monitor with increased mobility 4. Neuropsych: This patient is capable of making decisions on his/her own behalf. 5. Postoperative anemia. Continue iron supplement. Followup CBC 6. Diabetes mellitus. Hemoglobin A1c 8.4. Currently on Lantus insulin 10 units each bedtime. Glyburide/5 mg twice a day , Glucophage 500 mg twice a day. Check blood sugars a.c. and at bedtime and resume oral agents as tolerated 7. Hypertension. Norvasc 10 mg daily, Avapro 150 mg daily. Monitor with increased mobility    07/10/2012, Zach Abdulloh Ullom, MD 

## 2012-07-10 NOTE — Progress Notes (Signed)
Physical Therapy Treatment Patient Details Name: [name] Mathews MRN: [id number] DOB: Aug 28, 1934 Today's Date: 07/10/2012 Time: 0825-0850 PT Time Calculation (min): 25 min  PT Assessment / Plan / Recommendation Comments on Treatment Session  Patient continuing to progress. Stated she is sore from not having BM. Patient requring A with brace and mobility and needed cues for safety    Follow Up Recommendations  Inpatient Rehab    Barriers to Discharge        Equipment Recommendations  Defer to next venue    Recommendations for Other Services    Frequency Min 5X/week   Plan Discharge plan remains appropriate;Frequency remains appropriate    Precautions / Restrictions Precautions Precautions: Back Precaution Comments: Patient unable to recall precautions. Reeducated on all three back precuations Spinal Brace: Lumbar corset;Applied in sitting position   Pertinent Vitals/Pain     Mobility  Bed Mobility Rolling Right: 4: Min assist;With rail Right Sidelying to Sit: 4: Min assist;With rails Details for Bed Mobility Assistance: A for correct log roll technique and assistance to position legs out of bed. Patient required A at shoulders to come into upright positioning Transfers Sit to Stand: 4: Min assist;From bed;With upper extremity assist Stand to Sit: 4: Min assist;To chair/3-in-1;With upper extremity assist;With armrests Details for Transfer Assistance: A to initiate stand and to control descent onto recliner. Cues for safe technique and hand placement Ambulation/Gait Ambulation/Gait Assistance: 4: Min assist Ambulation Distance (Feet): 200 Feet Assistive device: Rolling walker Ambulation/Gait Assistance Details: Cues and asssitance for RW managemet and safety Gait Pattern: Step-through pattern;Decreased stride length;Trunk flexed    Exercises     PT Diagnosis:    PT Problem List:   PT Treatment Interventions:     PT Goals Acute Rehab PT Goals PT Goal: Rolling Supine to  Right Side - Progress: Progressing toward goal PT Goal: Supine/Side to Sit - Progress: Progressing toward goal PT Goal: Sit to Stand - Progress: Progressing toward goal PT Goal: Stand to Sit - Progress: Progressing toward goal PT Goal: Ambulate - Progress: Progressing toward goal Additional Goals PT Goal: Additional Goal #1 - Progress: Progressing toward goal  Visit Information  Last PT Received On: 07/10/12 Assistance Needed: +1    Subjective Data      Cognition  Overall Cognitive Status: Appears within functional limits for tasks assessed/performed Arousal/Alertness: Awake/alert Orientation Level: Appears intact for tasks assessed Behavior During Session: [facility] for tasks performed Cognition - Other Comments: Patients appears to be in a "fog" throughout session. She is awake but expresionless and very quiet throughout    Balance     End of Session PT - End of Session Equipment Utilized During Treatment: Gait belt;Back brace Activity Tolerance: Patient tolerated treatment well Patient left: in chair;with call bell/phone within reach;with family/visitor present Nurse Communication: Mobility status   GP     Fredrich Birks 07/10/2012, 9:40 AM 07/10/2012 Fredrich Birks PTA [phone] pager [phone] office

## 2012-07-10 NOTE — Clinical Social Work Note (Signed)
CSW met with pt and family to address dishcarge plan. Pt will be discharging to [facility]. Pt and pt's family are agreeable to discharge plan. CSW is signing off as no further needs identified.   Dede Query, MSW, Theresia Majors [phone]

## 2012-07-10 NOTE — Progress Notes (Signed)
Inpatient Diabetes Program Recommendations  AACE/ADA: New Consensus Statement on Inpatient Glycemic Control (2013)  Target Ranges:  Prepandial:   less than 140 mg/dL      Peak postprandial:   less than 180 mg/dL (1-2 hours)      Critically ill patients:  140 - 180 mg/dL   Reason for Visit: Results for [name], Mathews (MRN [id number]) as of 07/10/2012 14:57  Ref. Range 07/09/2012 11:40 07/09/2012 16:46 07/09/2012 22:10 07/10/2012 07:08 07/10/2012 11:30  Glucose-Capillary Latest Range: 70-99 mg/dL 629 (H) 528 (H) 413 (H) 225 (H) 166 (H)   Consider increasing Lantus to 15 units daily.  Note A1C=8.4% indicating sub-optimal control prior to admission.  Will follow.

## 2012-07-11 ENCOUNTER — Inpatient Hospital Stay (HOSPITAL_COMMUNITY): Payer: Medicare Other | Admitting: Occupational Therapy

## 2012-07-11 ENCOUNTER — Inpatient Hospital Stay (HOSPITAL_COMMUNITY): Payer: Medicare Other

## 2012-07-11 ENCOUNTER — Inpatient Hospital Stay (HOSPITAL_COMMUNITY): Payer: Medicare Other | Admitting: *Deleted

## 2012-07-11 DIAGNOSIS — M5410 Radiculopathy, site unspecified: Secondary | ICD-10-CM

## 2012-07-11 DIAGNOSIS — IMO0002 Reserved for concepts with insufficient information to code with codable children: Secondary | ICD-10-CM

## 2012-07-11 DIAGNOSIS — Z5189 Encounter for other specified aftercare: Secondary | ICD-10-CM

## 2012-07-11 DIAGNOSIS — N390 Urinary tract infection, site not specified: Secondary | ICD-10-CM

## 2012-07-11 DIAGNOSIS — M48061 Spinal stenosis, lumbar region without neurogenic claudication: Secondary | ICD-10-CM

## 2012-07-11 LAB — GLUCOSE, CAPILLARY: Glucose-Capillary: 151 mg/dL — ABNORMAL HIGH (ref 70–99)

## 2012-07-11 LAB — CULTURE, BLOOD (ROUTINE X 2): Culture: NO GROWTH

## 2012-07-11 LAB — CBC WITH DIFFERENTIAL/PLATELET
Basophils Relative: 1 % (ref 0–1)
HCT: 22.1 % — ABNORMAL LOW (ref 36.0–46.0)
MCH: 27.4 pg (ref 26.0–34.0)
MCHC: 33 g/dL (ref 30.0–36.0)
MCV: 83.1 fL (ref 78.0–100.0)
Monocytes Relative: 8 % (ref 3–12)
Platelets: 278 10*3/uL (ref 150–400)
RDW: 14.9 % (ref 11.5–15.5)

## 2012-07-11 LAB — COMPREHENSIVE METABOLIC PANEL
Albumin: 2.5 g/dL — ABNORMAL LOW (ref 3.5–5.2)
Alkaline Phosphatase: 99 U/L (ref 39–117)
BUN: 13 mg/dL (ref 6–23)
CO2: 25 mEq/L (ref 19–32)
Chloride: 101 mEq/L (ref 96–112)
Potassium: 3.7 mEq/L (ref 3.5–5.1)
Total Bilirubin: 0.3 mg/dL (ref 0.3–1.2)

## 2012-07-11 NOTE — Progress Notes (Signed)
Occupational Therapy Session Note  Patient Details  Name: [name] MRN: [id number] Date of Birth: 04-Sep-1934  Today's Date: 07/11/2012 Time: 1310-1345 Time Calculation (min): 35 min  Short Term Goals: Week 1:  OT Short Term Goal 1 (Week 1): Short Term Goals = Long Term Goals  Skilled Therapeutic Interventions/Progress Updates:  Patient seen this pm for 1:1 OT session to address functional mobility and self care task independence.  Patient ambulated to bathroom with minimal assistance and no device.  Patient had 1 minor loss of balance backward when walking up a mild incline.  Patient walked to apartment with minimal assistance, and practiced bed making, bed mobility and furniture transfers.    Therapy Documentation Precautions:  Precautions Precautions: Back;Fall Precaution Comments: Patient unable to recall back precautions, plan to continue education regarding precautions Required Braces or Orthoses: Spinal Brace Spinal Brace: Lumbar corset Restrictions Weight Bearing Restrictions: No    Pain:  No report of pain    See FIM for current functional status  Therapy/Group: Individual Therapy  Collier Salina 07/11/2012, 1:54 PM

## 2012-07-11 NOTE — Evaluation (Signed)
Recreational Therapy Assessment and Plan  Patient Details  Name: VERBA DEGEN MRN: [id number] Date of Birth: [birth year] Today's Date: 07/11/2012  Rehab Potential: Good ELOS: 7-10 days   Assessment Clinical Impression: Problem List:  Patient Active Problem List   Diagnosis   .  HTN (hypertension)   .  Diabetes mellitus   .  Encephalopathy acute   .  Tachycardia   .  UTI (lower urinary tract infection)   .  Radiculopathy    Past Medical History:  Past Medical History   Diagnosis  Date   .  Hyperlipidemia    .  Anxiety    .  Diabetes mellitus    .  Arthritis    .  Anemia      hx of   .  Cataract      left eye   .  Chronic back pain    .  Hypertension      sees Dr. Margret Chance, [facility] internal medicine   .  Peripheral vascular disease    .  Breast lump      hx of    Past Surgical History:  Past Surgical History   Procedure  Date   .  Appendectomy    .  Breast surgery      cyst removed left side   .  Tonsillectomy    .  Dilation and curettage of uterus    .  Back surgery    .  Colonoscopy    .  Cardiovascular stress test     Assessment & Plan  Clinical Impression: Patient is a 78 y.o. year old female with history of diabetes mellitus with peripheral neuropathy and decompressive lumbar laminectomy in 1994. Admitted 07/04/2012 with progressive low back pain radiating to the lower extremity. She denied any bowel or bladder disturbances. Lumbar myelogram demonstrated multilevel degenerative changes, stenosis spondylolisthesis with radiculopathy. Underwent redo laminectomy at lumbar L3-4 and L4-5 to decompress the bilateral L3, L4 and L5 nerve roots with posterior lumbar interbody fusion 07/04/2012 per Dr. Lovell Sheehan. Fitted with aspen lumbar fusion brace. Patient transferred to CIR on 07/10/2012 .   Met with pt and discussed leisure interests and community reintegration with pt and family.  No formal treatment plan implemented at this time.  Will monitor through team  for participation in community reintegration prior to discharge. Leisure History/Participation Premorbid leisure interest/current participation: Games Chief of Staff;Ashby Dawes - Flower gardening;Community - Grocery store;Community - Shopping mall Expression Interests: Music (Comment);Play instrument (Comment) (plays piano 2x Sundays a month at church) Other Leisure Interests: Reading;Cooking/Baking Leisure Participation Style: With Family/Friends Awareness of Community Resources: Good-identify 3 post discharge leisure resources Psychosocial / Spiritual Spiritual Interests: Church;Womens[employer] Groups (assists in serving communion) Patient agreeable to Pet Therapy: Yes Does patient have pets?: No Social interaction - Mood/Behavior: Cooperative Film/video editor for Education?: Yes Patient Agreeable to Outing?: Yes Recreational Therapy Orientation Orientation -Reviewed with patient: Available activity resources Strengths/Weaknesses Patient Strengths/Abilities: Willingness to participate;Active premorbidly Patient weaknesses: Physical limitations  Recommendations for other services: None  The above assessment, treatment plan, treatment alternatives and goals were discussed and mutually agreed upon: by patient  Joselin Crandell 07/11/2012, 12:25 PM

## 2012-07-11 NOTE — Progress Notes (Signed)
Physical Therapy Session Note  Patient Details  Name: [name] Mathews MRN: [id number] Date of Birth: [birth year]  Today's Date: 07/11/2012 Time: 9147-8295 Time Calculation (min): 30 min  Short Term Goals: Week 1:  PT Short Term Goal 1 (Week 1): = LTGs  Treatment focused on dynamic gait training without AD while completing cognitive challenge to name foods according to the alphabet, steady A overall, no LOB. Pt with increased difficulty coming up with names of foods in relation to a letter, and pt reports she has a "one track mind" and has never been good at multi-tasking. Daughter also in agreement with this statement. Nustep for functional strengthening and endurance x 5 minutes on level 4, multiple rest breaks needed due to fatigue with slow cadence noted.   Therapy Documentation Precautions:  Precautions Precautions: Back;Fall Precaution Comments: Patient unable to recall back precautions, plan to continue education regarding precautions Required Braces or Orthoses: Spinal Brace Spinal Brace: Lumbar corset Restrictions Weight Bearing Restrictions: No Pain:  Denies pain.   See FIM for current functional status  Therapy/Group: Individual Therapy  Karolee Stamps [facility] 07/11/2012, 2:55 PM

## 2012-07-11 NOTE — Care Management Note (Signed)
    Page 1 of 1   07/11/2012     8:25:57 AM   CARE MANAGEMENT NOTE 07/11/2012  Patient:  MALIAH, PYLES   Account Number:  [id number]  Date Initiated:  07/05/2012  Documentation initiated by:  [facility]  Subjective/Objective Assessment:   Admitted postop  PLIF L3-5.     Action/Plan:   PT/OT evals  inpatient rehab consult   Anticipated DC Date:  07/10/2012   Anticipated DC Plan:  IP REHAB FACILITY      DC Planning Services  CM consult      PAC Choice  IP REHAB   Choice offered to / List presented to:             Status of service:  Completed, signed off Medicare Important Message given?   (If response is "NO", the following Medicare IM given date fields will be blank) Date Medicare IM given:   Date Additional Medicare IM given:    Discharge Disposition:  IP REHAB FACILITY  Per UR Regulation:  Reviewed for med. necessity/level of care/duration of stay  If discussed at Long Length of Stay Meetings, dates discussed:   07/10/2012    Comments:  07/11/12 Patient discharged to inpatient rehab.

## 2012-07-11 NOTE — Progress Notes (Signed)
Orthopedic Tech Progress Note Patient Details:  [name] Mathews Mar 06, 1934 [id number]  Patient ID: [name], female   DOB: 05/13/1934, 78 y.o.   MRN: [id number]   Shawnie Pons 07/11/2012, 11:17 AM CALLED BIO TECH FOR LUMBAR FUSION BRACE

## 2012-07-11 NOTE — Evaluation (Signed)
Physical Therapy Assessment and Plan  Patient Details  Name: [name] Mathews MRN: [id number] Date of Birth: Sep 29, 1934  PT Diagnosis: Difficulty walking, Decreased Muscular Endurance, decreased balance and Low back pain Rehab Potential: Excellent ELOS: 7-10 days   Today's Date: 07/11/2012 Time: 1030-1130 Time Calculation (min): 60 min  Problem List:  Patient Active Problem List  Diagnosis  . HTN (hypertension)  . Diabetes mellitus  . Encephalopathy acute  . Tachycardia  . UTI (lower urinary tract infection)  . Radiculopathy    Past Medical History:  Past Medical History  Diagnosis Date  . Hyperlipidemia   . Anxiety   . Diabetes mellitus   . Arthritis   . Anemia     hx of  . Cataract     left eye  . Chronic back pain   . Hypertension     sees Dr. Margret Chance, [facility] internal medicine  . Peripheral vascular disease   . Breast lump     hx of   Past Surgical History:  Past Surgical History  Procedure Date  . Appendectomy   . Breast surgery     cyst removed left side  . Tonsillectomy   . Dilation and curettage of uterus   . Back surgery   . Colonoscopy   . Cardiovascular stress test     Assessment & Plan Clinical Impression: Patient is a 78 y.o. year old female with history of diabetes mellitus with peripheral neuropathy and decompressive lumbar laminectomy in 1994. Admitted 07/04/2012 with progressive low back pain radiating to the lower extremity. She denied any bowel or bladder disturbances. Lumbar myelogram demonstrated multilevel degenerative changes, stenosis spondylolisthesis with radiculopathy. Underwent redo laminectomy at lumbar L3-4 and L4-5 to decompress the bilateral L3, L4 and L5 nerve roots with posterior lumbar interbody fusion 07/04/2012 per Dr. Lovell Sheehan. Fitted with aspen lumbar fusion brace.  Patient transferred to CIR on 07/10/2012 .   Patient currently requires min with mobility secondary to muscle weakness, decreased cardiorespiratoy  endurance and decreased standing balance and decreased balance strategies.  Prior to hospitalization, patient was independent with mobility and lived with Daughter in a House home.  Home access is  Level entry.  Patient will benefit from skilled PT intervention to maximize safe functional mobility, minimize fall risk and decrease caregiver burden for planned discharge home with 24 hour supervision.  Anticipate patient will benefit from follow up OP at discharge.  PT - End of Session Endurance Deficit: No PT Assessment Rehab Potential: Excellent Barriers to Discharge: None PT Plan PT Frequency: 1-2 X/day, 60-90 minutes Estimated Length of Stay: 7-10 days PT Treatment/Interventions: Ambulation/gait training;Balance/vestibular training;Community reintegration;Discharge planning;DME/adaptive equipment instruction;Disease management/prevention;Functional mobility training;Neuromuscular re-education;Pain management;Patient/family education;Psychosocial support;Splinting/orthotics;Stair training;Skin care/wound management;Therapeutic Activities;Therapeutic Exercise;UE/LE Strength taining/ROM;UE/LE Coordination activities;Wheelchair propulsion/positioning PT Recommendation Follow Up Recommendations: Outpatient PT Equipment Recommended:  (AD TBD)  PT Evaluation Precautions/Restrictions Precautions Precautions: Back;Fall Precaution Comments: Patient unable to recall back precautions, plan to continue education regarding precautions Required Braces or Orthoses: Spinal Brace Spinal Brace: Lumbar corset Restrictions Weight Bearing Restrictions: No Pain Reports premedication for pain. Feels good  Home Living/Prior Functioning Home Living Lives With: Daughter Available Help at Discharge: Family;Available 24 hours/day Type of Home: House Home Access: Level entry Home Layout: Two level;Able to live on main level with bedroom/bathroom Bathroom Shower/Tub: Walk-in shower;Tub/shower unit Bathroom  Accessibility: Yes Home Adaptive Equipment: Straight cane;Grab bars in shower;Built-in shower seat;Bedside commode/3-in-1 Prior Function Level of Independence: Independent with basic ADLs;Independent with gait;Independent with transfers Able to Take Stairs?: Yes Driving: Yes  Vocation: Retired Optometrist - History Baseline Vision: Wears glasses all the time Patient Visual Report: No change from baseline Vision - Assessment Eye Alignment: Within Functional Limits Perception Perception: Within Functional Limits Praxis Praxis: Intact  Cognition Overall Cognitive Status: Appears within functional limits for tasks assessed Arousal/Alertness: Awake/alert Orientation Level: Oriented X4 Memory: Appears intact Awareness: Appears intact Problem Solving: Appears intact Safety/Judgment: Appears intact Sensation Sensation Light Touch: Appears Intact Additional Comments: bilateral UEs appear intact Coordination Gross Motor Movements are Fluid and Coordinated: Yes Fine Motor Movements are Fluid and Coordinated: Yes Motor  Motor Motor: Within Functional Limits     Trunk/Postural Assessment  Cervical Assessment Cervical Assessment: Within Functional Limits Thoracic Assessment Thoracic Assessment: Within Functional Limits Lumbar Assessment Lumbar Assessment: Exceptions to [facility] (back precautions, lumbar corset)  Balance Berg Balance Test Sit to Stand: Needs minimal aid to stand or to stabilize Standing Unsupported: Able to stand 2 minutes with supervision Sitting with Back Unsupported but Feet Supported on Floor or Stool: Able to sit safely and securely 2 minutes Stand to Sit: Uses backs of legs against chair to control descent Transfers: Needs one person to assist Standing Unsupported with Eyes Closed: Able to stand 10 seconds with supervision Standing Ubsupported with Feet Together: Able to place feet together independently and stand for 1 minute with supervision From  Standing, Reach Forward with Outstretched Arm: Reaches forward but needs supervision From Standing Position, Pick up Object from Floor: Unable to try/needs assist to keep balance (NT due to back precautions) From Standing Position, Turn to Look Behind Over each Shoulder: Needs assist to keep from losing balance and falling (head turn only (limited) due to Back precautions) Turn 360 Degrees: Needs assistance while turning Standing Unsupported, Alternately Place Feet on Step/Stool: Able to complete >2 steps/needs minimal assist Standing Unsupported, One Foot in Front: Needs help to step but can hold 15 seconds Standing on One Leg: Unable to try or needs assist to prevent fall Total Score: 20  Static Sitting Balance Static Sitting - Level of Assistance: 5: Stand by assistance Dynamic Sitting Balance Dynamic Sitting - Level of Assistance: 5: Stand by assistance Static Standing Balance Static Standing - Level of Assistance: 4: Min assist Dynamic Standing Balance Dynamic Standing - Level of Assistance: 4: Min assist Extremity Assessment  RUE Assessment RUE Assessment: Within Functional Limits (can benefit from UE strengthening) LUE Assessment LUE Assessment: Within Functional Limits (can benefit from UE strengthening) RLE Assessment RLE Assessment: Within Functional Limits (decreased muscular endurance) LLE Assessment LLE Assessment: Within Functional Limits (decreased muscular endurance)  See FIM for current functional status Refer to Care Plan for Long Term Goals  Recommendations for other services: None  Discharge Criteria: Patient will be discharged from PT if patient refuses treatment 3 consecutive times without medical reason, if treatment goals not met, if there is a change in medical status, if patient makes no progress towards goals or if patient is discharged from hospital.  The above assessment, treatment plan, treatment alternatives and goals were discussed and mutually agreed  upon: by patient and by family  Individual treatment initiated with gait training without AD including side stepping and retro gait (min A overall), basic transfers, reviewed back precautions (able to verbalize 2/3 but demonstrated bed mobility technique without cueing), and administered Berg Balance test (see above for results). Discussed overall goals and LOS with pt and family.  Karolee Stamps [facility] 07/11/2012, 11:40 AM

## 2012-07-11 NOTE — Progress Notes (Signed)
Patient information reviewed and entered into [employer] system by Willella Harding, RN, CRRN, PPS Coordinator.  Information including medical coding and functional independence measure will be reviewed and updated through discharge.     Per nursing patient was given "Data Collection Information Summary for Patients in Inpatient Rehabilitation Facilities with attached "Privacy Act Statement-Health Care Records" upon admission.   

## 2012-07-11 NOTE — Evaluation (Signed)
Occupational Therapy Assessment and Plan & Session Note  Patient Details  Name: NOREENE BOREMAN MRN: [id number] Date of Birth: 1934-02-14  OT Diagnosis: abnormal posture, lumbago (low back pain) and muscle weakness (generalized) Rehab Potential: Rehab Potential: Good ELOS: 7-10 days   Today's Date: 07/11/2012 Time: 1030-1130 Time Calculation (min): 60 min  Problem List:  Patient Active Problem List  Diagnosis  . HTN (hypertension)  . Diabetes mellitus  . Encephalopathy acute  . Tachycardia  . UTI (lower urinary tract infection)  . Radiculopathy    Past Medical History:  Past Medical History  Diagnosis Date  . Hyperlipidemia   . Anxiety   . Diabetes mellitus   . Arthritis   . Anemia     hx of  . Cataract     left eye  . Chronic back pain   . Hypertension     sees Dr. Margret Chance, [facility] internal medicine  . Peripheral vascular disease   . Breast lump     hx of   Past Surgical History:  Past Surgical History  Procedure Date  . Appendectomy   . Breast surgery     cyst removed left side  . Tonsillectomy   . Dilation and curettage of uterus   . Back surgery   . Colonoscopy   . Cardiovascular stress test     Assessment & Plan  Clinical Impression:Amy Kathie Rhodes Prisk is a 78 y.o. right-handed female with history of diabetes mellitus with peripheral neuropathy and decompressive lumbar laminectomy in 1994. Admitted 07/04/2012 with progressive low back pain radiating to the lower extremity. She denied any bowel or bladder disturbances. Lumbar myelogram demonstrated multilevel degenerative changes, stenosis spondylolisthesis with radiculopathy. Underwent redo laminectomy at lumbar L3-4 and L4-5 to decompress the bilateral L3, L4 and L5 nerve roots with posterior lumbar interbody fusion 07/04/2012 per Dr. Lovell Sheehan. Fitted with aspen lumbar fusion brace. Postoperative pain control. Acute blood loss anemia with latest hemoglobin 8.0 and monitored. Urine culture 07/08/2012  for low-grade temperature showed no growth and chest x-ray with no edema or effusions. Sequential compression devices were added for DVT prophylaxis. Physical and occupational therapy evaluations are pending. M.D. is requested physical medicine rehabilitation consult to consider inpatient rehabilitation. Patient transferred to CIR on 07/10/2012 .    Patient currently requires total with basic self-care skills and IADL secondary to muscle weakness and decreased sitting balance, decreased standing balance, decreased postural control, decreased balance strategies and difficulty maintaining precautions.  Prior to hospitalization, patient could complete ADLs & basic IADLs independently.   Patient will benefit from skilled intervention to increase independence with basic self-care skills prior to discharge home with family members.  Anticipate patient will require 24 hour supervision and additional occupational therapy is to be determined.  OT - End of Session Activity Tolerance: Tolerates 30+ min activity with multiple rests Endurance Deficit: No OT Assessment Rehab Potential: Good Barriers to Discharge: None (none known at this time) OT Plan OT Frequency: 1-2 X/day, 60-90 minutes Estimated Length of Stay: 7-10 days OT Treatment/Interventions: Balance/vestibular training;Community reintegration;Discharge planning;DME/adaptive equipment instruction;Functional mobility training;Neuromuscular re-education;Pain management;Patient/family education;Psychosocial support;Self Care/advanced ADL retraining;Skin care/wound managment;Splinting/orthotics;Therapeutic Activities;Therapeutic Exercise;UE/LE Strength taining/ROM;UE/LE Coordination activities;Wheelchair propulsion/positioning OT Recommendation Follow Up Recommendations:  (OT: TBD) Equipment Recommended:  (AD TBD)  Precautions/Restrictions  Precautions Precautions: Back;Fall Precaution Comments: Patient unable to recall back precautions, plan to  continue education regarding precautions Required Braces or Orthoses: Spinal Brace Spinal Brace: Lumbar corset Restrictions Weight Bearing Restrictions: No  General Chart Reviewed: Yes Family/Caregiver Present: Yes (daugther,  [name])  Pain Pain Assessment Pain Assessment: No/denies pain Pain Score: 0-No pain Faces Pain Scale: No hurt  Home Living/Prior Functioning Home Living Lives With: Daughter Available Help at Discharge: Family;Available 24 hours/day Type of Home: House Home Access: Level entry Home Layout: Two level;Able to live on main level with bedroom/bathroom Bathroom Shower/Tub: Walk-in shower;Tub/shower unit Bathroom Accessibility: Yes Home Adaptive Equipment: Straight cane;Grab bars in shower;Built-in shower seat;Bedside commode/3-in-1 IADL History Homemaking Responsibilities: Yes Meal Prep Responsibility: Secondary Laundry Responsibility: Secondary Cleaning Responsibility: Secondary Prior Function Level of Independence: Independent with basic ADLs;Independent with gait;Independent with transfers Able to Take Stairs?: Yes Driving: Yes Vocation: Retired  ADL - See FIM  Vision/Perception  Vision - History Baseline Vision: Wears glasses all the time Patient Visual Report: No change from baseline Vision - Assessment Eye Alignment: Within Functional Limits Perception Perception: Within Functional Limits Praxis Praxis: Intact   Cognition Overall Cognitive Status: Appears within functional limits for tasks assessed Arousal/Alertness: Awake/alert Orientation Level: Oriented X4 Memory: Appears intact Awareness: Appears intact Problem Solving: Appears intact Safety/Judgment: Appears intact  Sensation Sensation Light Touch: Appears Intact Additional Comments: bilateral UEs appear intact Coordination Gross Motor Movements are Fluid and Coordinated: Yes Fine Motor Movements are Fluid and Coordinated: Yes  Motor  Motor Motor: Within Functional  Limits  Trunk/Postural Assessment  Cervical Assessment Cervical Assessment: Within Functional Limits Thoracic Assessment Thoracic Assessment: Within Functional Limits Lumbar Assessment Lumbar Assessment: Exceptions to [facility] (back precautions, lumbar corset)   Balance Berg Balance Test Sit to Stand: Needs minimal aid to stand or to stabilize Standing Unsupported: Able to stand 2 minutes with supervision Sitting with Back Unsupported but Feet Supported on Floor or Stool: Able to sit safely and securely 2 minutes Stand to Sit: Uses backs of legs against chair to control descent Transfers: Needs one person to assist Standing Unsupported with Eyes Closed: Able to stand 10 seconds with supervision Standing Ubsupported with Feet Together: Able to place feet together independently and stand for 1 minute with supervision From Standing, Reach Forward with Outstretched Arm: Reaches forward but needs supervision From Standing Position, Pick up Object from Floor: Unable to try/needs assist to keep balance (NT due to back precautions) From Standing Position, Turn to Look Behind Over each Shoulder: Needs assist to keep from losing balance and falling (head turn only (limited) due to Back precautions) Turn 360 Degrees: Needs assistance while turning Standing Unsupported, Alternately Place Feet on Step/Stool: Able to complete >2 steps/needs minimal assist Standing Unsupported, One Foot in Front: Needs help to step but can hold 15 seconds Standing on One Leg: Unable to try or needs assist to prevent fall Total Score: 20  Static Sitting Balance Static Sitting - Level of Assistance: 5: Stand by assistance Dynamic Sitting Balance Dynamic Sitting - Level of Assistance: 5: Stand by assistance Static Standing Balance Static Standing - Level of Assistance: 4: Min assist Dynamic Standing Balance Dynamic Standing - Level of Assistance: 4: Min assist  Extremity/Trunk Assessment RUE Assessment RUE Assessment:  Within Functional Limits (can benefit from UE strengthening) LUE Assessment LUE Assessment: Within Functional Limits (can benefit from UE strengthening)  See FIM for current functional status  Refer to Care Plan for Long Term Goals  Recommendations for other services: None  Discharge Criteria: Patient will be discharged from OT if patient refuses treatment 3 consecutive times without medical reason, if treatment goals not met, if there is a change in medical status, if patient makes no progress towards goals or if patient is discharged from  hospital.  The above assessment, treatment plan, treatment alternatives and goals were discussed and mutually agreed upon: by patient and by family  ----------------------------------------------------------------------------------------------------------------------  Session Note 0900-1000 - 60 Minutes Individual Therapy No complaints of pain Initial 1:1 occupational therapy evaluation completed. Focused skilled intervention on bed mobility, UB/LB bathing & dressing, sit/stands, adhering to back precautions, overall activity tolerance/endurance, hand over hand functional ambulation, and recliner transfer. At end of session, left patient seated in recliner beside bed with call bell & phone within reach.   Xan Sparkman 07/11/2012, 12:03 PM

## 2012-07-11 NOTE — Progress Notes (Signed)
Subjective/Complaints: Had a good night. Slept well. Pain controlled A 12 point review of systems has been performed and if not noted above is otherwise negative.   Objective: Vital Signs: Blood pressure 124/51, pulse 87, temperature 98.6 F (37 C), temperature source Oral, resp. rate 18, height 5\' 6"  (1.676 m), weight 105.9 kg (233 lb 7.5 oz), SpO2 96.00%. No results found.  Basename 07/11/12 0600 07/09/12 0900  WBC 7.8 9.6  HGB 7.3* 8.0*  HCT 22.1* 24.8*  PLT 278 239    Basename 07/11/12 0600 07/09/12 0525  NA 136 135  K 3.7 4.0  CL 101 101  CO2 25 25  GLUCOSE 159* 186*  BUN 13 15  CREATININE 1.01 1.04  CALCIUM 8.7 8.6   CBG (last 3)   Basename 07/10/12 2040 07/10/12 1615 07/10/12 1130  GLUCAP 195* 129* 166*    Wt Readings from Last 3 Encounters:  07/10/12 105.9 kg (233 lb 7.5 oz)  07/04/12 101.152 kg (223 lb)  07/04/12 101.152 kg (223 lb)    Physical Exam:  Constitutional: She is oriented to person. Thinks she's at daughter's house. She appears well-developed. Obese.  HENT:  Head: Normocephalic.  Eyes:  Pupils round and reactive to light  Neck: Neck supple. No thyromegaly present.  Cardiovascular: Normal rate, regular rhythm and normal heart sounds.  Pulmonary/Chest: Effort normal and breath sounds normal. No respiratory distress. She has no wheezes.  Occasional cough, rhonchi  Abdominal: Bowel sounds are normal. She exhibits no distension. There is no tenderness.  Musculoskeletal: She exhibits no edema.  Neurological: She is alert and oriented to person, place, and time.  Very alert. Moves all 4's.  LE 2-3 prox to 4/5 distally. No gross sensory deficits. Skin:  Back incision with blisters along incision. Incision itself is nicely healed. Psychiatric: She has a normal mood and affect. Her behavior is normal   Assessment/Plan: 1. Functional deficits secondary to Lumbar spondylolisthesis, stenosis with radiculopathy. Status post redo laminectomy lumbar  L3-4 and 4-5 with posterior lumbar interbody fusion.  which require 3+ hours per day of interdisciplinary therapy in a comprehensive inpatient rehab setting. Physiatrist is providing close team supervision and 24 hour management of active medical problems listed below. Physiatrist and rehab team continue to assess barriers to discharge/monitor patient progress toward functional and medical goals. FIM:                                  Medical Problem List and Plan:  1. Lumbar spondylolisthesis, stenosis with radiculopathy. Status post redo laminectomy lumbar L3-4 and 4-5 with posterior lumbar interbody fusion 07/04/2012  2. DVT Prophylaxis/Anticoagulation: SCDs. Monitor for any signs of DVT.  3. Pain Management: Norco as needed. Monitor with increased mobility  4. Neuropsych: This patient is capable of making decisions on his/her own behalf.  5. Postoperative anemia. Continue iron supplement. Followup CBC 7.3--recheck tomorrow. May need transfusion.  6. Diabetes mellitus. Hemoglobin A1c 8.4. Currently on Lantus insulin 10 units each bedtime. Glyburide/5 mg twice a day , Glucophage 500 mg twice a day. Check blood sugars a.c. and at bedtime and resume oral agents as tolerated- fair control thus far. 7. Hypertension. Norvasc 10 mg daily, Avapro 150 mg daily. Monitor with increased mobility 8. Low grade temp:  -check urine culture,- no abx yet  LOS (Days) 1 A FACE TO FACE EVALUATION WAS PERFORMED  SWARTZ,ZACHARY T 07/11/2012, 8:35 AM

## 2012-07-12 ENCOUNTER — Inpatient Hospital Stay (HOSPITAL_COMMUNITY): Payer: Medicare Other

## 2012-07-12 ENCOUNTER — Inpatient Hospital Stay (HOSPITAL_COMMUNITY): Payer: Medicare Other | Admitting: Occupational Therapy

## 2012-07-12 LAB — GLUCOSE, CAPILLARY: Glucose-Capillary: 166 mg/dL — ABNORMAL HIGH (ref 70–99)

## 2012-07-12 LAB — CBC
HCT: 23.2 % — ABNORMAL LOW (ref 36.0–46.0)
MCHC: 32.8 g/dL (ref 30.0–36.0)
MCV: 83.2 fL (ref 78.0–100.0)
RDW: 14.8 % (ref 11.5–15.5)

## 2012-07-12 MED ORDER — SENNOSIDES-DOCUSATE SODIUM 8.6-50 MG PO TABS
2.0000 | ORAL_TABLET | Freq: Every day | ORAL | Status: DC
  Administered 2012-07-12 – 2012-07-17 (×5): 2 via ORAL
  Filled 2012-07-12 (×3): qty 2
  Filled 2012-07-12: qty 1
  Filled 2012-07-12 (×5): qty 2

## 2012-07-12 MED ORDER — HYDROCORTISONE 2.5 % RE CREA
1.0000 "application " | TOPICAL_CREAM | Freq: Two times a day (BID) | RECTAL | Status: DC
  Administered 2012-07-12 – 2012-07-17 (×6): 1 via RECTAL
  Filled 2012-07-12: qty 28.35

## 2012-07-12 NOTE — Progress Notes (Signed)
Subjective/Complaints: Constipated from iron. Pain controlled. Tolerated therapy fairly well. A 12 point review of systems has been performed and if not noted above is otherwise negative.   Objective: Vital Signs: Blood pressure 132/68, pulse 88, temperature 98.6 F (37 C), temperature source Oral, resp. rate 18, height 5\' 6"  (1.676 m), weight 111.1 kg (244 lb 14.9 oz), SpO2 100.00%. No results found.  Basename 07/12/12 0535 07/11/12 0600  WBC 8.6 7.8  HGB 7.6* 7.3*  HCT 23.2* 22.1*  PLT 331 278    Basename 07/11/12 0600  NA 136  K 3.7  CL 101  CO2 25  GLUCOSE 159*  BUN 13  CREATININE 1.01  CALCIUM 8.7   CBG (last 3)   Basename 07/12/12 0741 07/12/12 0720 07/11/12 2057  GLUCAP 65* 51* 98    Wt Readings from Last 3 Encounters:  07/11/12 111.1 kg (244 lb 14.9 oz)  07/04/12 101.152 kg (223 lb)  07/04/12 101.152 kg (223 lb)    Physical Exam:  Constitutional: She is oriented to person. Thinks she's at daughter's house. She appears well-developed. Obese.  HENT:  Head: Normocephalic.  Eyes:  Pupils round and reactive to light  Neck: Neck supple. No thyromegaly present.  Cardiovascular: Normal rate, regular rhythm and normal heart sounds.  Pulmonary/Chest: Effort normal and breath sounds normal. No respiratory distress. She has no wheezes.  Occasional cough, rhonchi  Abdominal: Bowel sounds are normal. She exhibits no distension. There is no tenderness.  Musculoskeletal: She exhibits no edema.  Neurological: She is alert and oriented to person, place, and time.  Very alert. Moves all 4's.  LE 2-3 prox to 4/5 distally. No gross sensory deficits. Skin:  Back incision with blisters along incision. Incision itself is nicely healed. Psychiatric: She has a normal mood and affect. Her behavior is normal   Assessment/Plan: 1. Functional deficits secondary to Lumbar spondylolisthesis, stenosis with radiculopathy. Status post redo laminectomy lumbar L3-4 and 4-5 with  posterior lumbar interbody fusion.  which require 3+ hours per day of interdisciplinary therapy in a comprehensive inpatient rehab setting. Physiatrist is providing close team supervision and 24 hour management of active medical problems listed below. Physiatrist and rehab team continue to assess barriers to discharge/monitor patient progress toward functional and medical goals. FIM: FIM - Bathing Bathing Steps Patient Completed: Chest;Right Arm;Left Arm;Abdomen;Front perineal area;Buttocks;Right upper leg;Left upper leg Bathing: 4: Min-Patient completes 8-9 60f 10 parts or 75+ percent (and steady assist during standing for peri care)  FIM - Upper Body Dressing/Undressing Upper body dressing/undressing: 1: Total-Patient completed less than 25% of tasks FIM - Lower Body Dressing/Undressing Lower body dressing/undressing: 1: Total-Patient completed less than 25% of tasks  FIM - Toileting Toileting: 0: Activity did not occur  FIM - Diplomatic Services operational officer Devices: Grab bars;Walker Toilet Transfers: 4-To toilet/BSC: Min A (steadying Pt. > 75%);4-From toilet/BSC: Min A (steadying Pt. > 75%)  FIM - Bed/Chair Transfer Bed/Chair Transfer: 5: Sit > Supine: Supervision (verbal cues/safety issues);5: Supine > Sit: Supervision (verbal cues/safety issues);4: Bed > Chair or W/C: Min A (steadying Pt. > 75%);4: Chair or W/C > Bed: Min A (steadying Pt. > 75%)  FIM - Locomotion: Wheelchair Locomotion: Wheelchair: 1: Total Assistance/staff pushes wheelchair (Pt<25%) (gait to be primary means of mobility) FIM - Locomotion: Ambulation Locomotion: Ambulation: 2: Travels 50 - 149 ft with minimal assistance (Pt.>75%)  Comprehension Comprehension Mode: Auditory Comprehension: 5-Follows basic conversation/direction: With no assist  Expression Expression Mode: Verbal Expression: 5-Expresses basic needs/ideas: With no assist  Social Interaction  Social Interaction: 5-Interacts  appropriately 90% of the time - Needs monitoring or encouragement for participation or interaction.  Problem Solving Problem Solving: 5-Solves complex 90% of the time/cues < 10% of the time  Memory Memory: 5-Recognizes or recalls 90% of the time/requires cueing < 10% of the time  Medical Problem List and Plan:  1. Lumbar spondylolisthesis, stenosis with radiculopathy. Status post redo laminectomy lumbar L3-4 and 4-5 with posterior lumbar interbody fusion 07/04/2012  2. DVT Prophylaxis/Anticoagulation: SCDs. Monitor for any signs of DVT.  3. Pain Management: Norco as needed. Monitor with increased mobility  4. Neuropsych: This patient is capable of making decisions on his/her own behalf.  5. Postoperative anemia. Continue iron supplement. Followup CBC 7.6 today  -no symptoms currently  -will hold iron per pt request although they understand its importance  -recheck tomorrow. Encourage iron intake in diet 6. Diabetes mellitus. Hemoglobin A1c 8.4.   -hold glyburide, metformin, and lantus insulin given low cbgs'7. Hypertension. Norvasc 10 mg daily, Avapro 150 mg daily. Monitor with increased mobility 8. Low grade temp:  -urine reculture pending  LOS (Days) 2 A FACE TO FACE EVALUATION WAS PERFORMED  SWARTZ,ZACHARY T 07/12/2012, 8:13 AM

## 2012-07-12 NOTE — Progress Notes (Signed)
Physical Therapy Session Note  Patient Details  Name: [name] Mathews MRN: [id number] Date of Birth: 20-Jun-1934  Today's Date: 07/12/2012 Time: 1300-1355 Time Calculation (min): 55 min  Short Term Goals: Week 1:  PT Short Term Goal 1 (Week 1): = LTGs  Skilled Therapeutic Interventions/Progress Updates:    Assisted pt to bathroom with overall supervision for balance and managing clothing/hygiene. Simulated car transfer with [facility] and cues needed for precautions and technique for safety. Gait with SPC while turning head to identify signs on the wall overall close supervision but requires cues to scan environment (decreased attention to the R noted). Dynamic gait through obstacle course to simulate home environment and stepping over objects with intermittent steady A; when cone on the R pt stepped into it and required cueing to avoid obstacle. Asked pt if she had difficulty on the right but reports she didn't notice anything different. Discussed with primary OT to investigate vision/perception during future session.  Therapy Documentation Precautions:  Precautions Precautions: Back;Fall Precaution Comments: Patient unable to recall back precautions, plan to continue education regarding precautions Required Braces or Orthoses: Spinal Brace Spinal Brace: Lumbar corset Restrictions Weight Bearing Restrictions: No    Pain:      See FIM for current functional status  Therapy/Group: Individual Therapy  Karolee Stamps [facility] 07/12/2012, 2:02 PM

## 2012-07-12 NOTE — Progress Notes (Signed)
Occupational Therapy Session Note  Patient Details  Name: [name] Mathews MRN: [id number] Date of Birth: 07-22-1934  Today's Date: 07/12/2012 Time: 0900-1000 Time Calculation (min): 60 min  Short Term Goals: Week 1:  OT Short Term Goal 1 (Week 1): Short Term Goals = Long Term Goals  Skilled Therapeutic Interventions/Progress Updates:  Patient found seated in recliner with minimal complaints of pain. Patient stood and ambulated from recliner -> tub transfer bench set-up in walk-in shower. Patient performed ADL at shower level. Focused skilled intervention on functional ambulation without an assistive device. UB/LB bathing & dressing, education/demonstration/return demonstration with use of AE (reacher & sock aid), overall activity tolerance/endurance, donning/doffing of lumbar corset, adhering to back precautions during all tasks, toilet transfer on/off BSC, and toileting. At end of session left patient seated in recliner with BLEs elevated with call bell & phone within reach.   Precautions:  Precautions Precautions: Back;Fall Precaution Comments: Patient unable to recall back precautions, plan to continue education regarding precautions Required Braces or Orthoses: Spinal Brace Spinal Brace: Lumbar corset Restrictions Weight Bearing Restrictions: No  See FIM for current functional status  Therapy/Group: Individual Therapy  Analea Muller 07/12/2012, 10:02 AM

## 2012-07-12 NOTE — Progress Notes (Signed)
Hypoglycemic Event  CBG: 65  Treatment: 15 GM carbohydrate snack/ breakfast  Symptoms: None  Follow-up CBG: Time: 0804 CBG Result: 89  Possible Reasons for Event: Medication regimen: Glyperide and Metformin (see MAR)  Comments/MD notified: Dr. Riley Kill notified    Destane Speas G  Remember to initiate Hypoglycemia Order Set & complete

## 2012-07-12 NOTE — Progress Notes (Signed)
Hypoglycemic Event  CBG: 51  Treatment: 15 GM carbohydrate snack  Symptoms: None  Follow-up CBG: Time:0741 CBG Result:65  Possible Reasons for Event: Medication regimen: Glyperide and Metformin  Comments/MD notified: Dr. Riley Kill notified    [name] Mathews  Remember to initiate Hypoglycemia Order Set & complete

## 2012-07-12 NOTE — Progress Notes (Signed)
Physical Therapy Session Note  Patient Details  Name: [name] Mathews MRN: [id number] Date of Birth: 04-Apr-1934  Today's Date: 07/12/2012 Time: 1030-1130 Time Calculation (min): 60 min  Short Term Goals: Week 1:  PT Short Term Goal 1 (Week 1): = LTGs  Skilled Therapeutic Interventions/Progress Updates:   Gait training without AD with steady A and trial of SPC with steady A initially progressing to close supervision. Pt reports more confidence with SPC and states she has one at home already from a previous surgery. Issued [place] HEP for functional strengthening and balance with 2# ankle weights x 3 sets of 10 reps for each exercise. Stair training for strengthening and community mobility with bilateral handrails and single handrail with supervision overall, step to gait pattern noted.   Therapy Documentation Precautions:  Precautions Precautions: Back;Fall Precaution Comments: Patient unable to recall back precautions, plan to continue education regarding precautions Required Braces or Orthoses: Spinal Brace Spinal Brace: Lumbar corset Restrictions Weight Bearing Restrictions: No   Pain: Pain Assessment Pain Assessment: 0-10 Pain Score: 0-No pain Faces Pain Scale: No hurt   See FIM for current functional status  Therapy/Group: Individual Therapy  Karolee Stamps [facility] 07/12/2012, 12:23 PM

## 2012-07-12 NOTE — Progress Notes (Signed)
Orthopedic Tech Progress Note Patient Details:  [name] Mathews 09/11/1934 [id number]  Patient ID: [name], female   DOB: February 05, 1934, 78 y.o.   MRN: [id number]   Shawnie Pons 07/12/2012, 11:50 AM BRACE ORDER COMPLETED BY BIO TECH

## 2012-07-13 ENCOUNTER — Inpatient Hospital Stay (HOSPITAL_COMMUNITY): Payer: Medicare Other | Admitting: Physical Therapy

## 2012-07-13 ENCOUNTER — Inpatient Hospital Stay (HOSPITAL_COMMUNITY): Payer: Medicare Other | Admitting: Occupational Therapy

## 2012-07-13 ENCOUNTER — Inpatient Hospital Stay (HOSPITAL_COMMUNITY): Payer: Medicare Other

## 2012-07-13 DIAGNOSIS — Z5189 Encounter for other specified aftercare: Secondary | ICD-10-CM

## 2012-07-13 DIAGNOSIS — IMO0002 Reserved for concepts with insufficient information to code with codable children: Secondary | ICD-10-CM

## 2012-07-13 DIAGNOSIS — M48061 Spinal stenosis, lumbar region without neurogenic claudication: Secondary | ICD-10-CM

## 2012-07-13 DIAGNOSIS — D62 Acute posthemorrhagic anemia: Secondary | ICD-10-CM

## 2012-07-13 DIAGNOSIS — N390 Urinary tract infection, site not specified: Secondary | ICD-10-CM

## 2012-07-13 LAB — HEMOGLOBIN AND HEMATOCRIT, BLOOD: Hemoglobin: 7.5 g/dL — ABNORMAL LOW (ref 12.0–15.0)

## 2012-07-13 LAB — GLUCOSE, CAPILLARY
Glucose-Capillary: 145 mg/dL — ABNORMAL HIGH (ref 70–99)
Glucose-Capillary: 196 mg/dL — ABNORMAL HIGH (ref 70–99)
Glucose-Capillary: 103 mg/dL — ABNORMAL HIGH (ref 70–99)
Glucose-Capillary: 200 mg/dL — ABNORMAL HIGH (ref 70–99)

## 2012-07-13 MED ORDER — GLYBURIDE 5 MG PO TABS
5.0000 mg | ORAL_TABLET | Freq: Every day | ORAL | Status: DC
  Administered 2012-07-14 – 2012-07-16 (×3): 5 mg via ORAL
  Filled 2012-07-13 (×5): qty 1

## 2012-07-13 NOTE — Progress Notes (Signed)
Physical Therapy Session Note  Patient Details  Name: [name] Mathews MRN: [id number] Date of Birth: 06/16/1934  Today's Date: 07/13/2012 Time: 1300-1400 Time Calculation (min): 60 min  Short Term Goals: Week 1:  PT Short Term Goal 1 (Week 1): = LTGs  Functional mobility training with SPC for gait in controlled environment forwards, retro gait, side stepping to R and L, obstacle negotiation, head turns, variations in speed, and quick starts and stops with overall S and no LOB. Pt noted to have difficulty with cognitive tasks and appears slow with processing multi-tasking during gait or mobility. Nustep for functional strengthening and endurance x 10 min on level 5. Balance training to work on ankle strategies and balance reactions on compliant surface with 1 UE support and no UE support to reach with R and L hand to place objects to simulate functional task while maintaining back precautions. Cues needed for back precautions to not twist during activity.     Therapy Documentation Precautions:  Precautions Precautions: Back;Fall Precaution Comments: Patient unable to recall back precautions, plan to continue education regarding precautions Required Braces or Orthoses: Spinal Brace Spinal Brace: Lumbar corset Restrictions Weight Bearing Restrictions: No   Pain:  Denies pain.   Locomotion : Ambulation Ambulation/Gait Assistance: 5: Supervision   See FIM for current functional status  Therapy/Group: Individual Therapy  Karolee Stamps [facility] 07/13/2012, 4:24 PM

## 2012-07-13 NOTE — Progress Notes (Addendum)
Patient had a bowel movement noted blood in toliet due to hemorrhoids. Called Pam Love, PA given order for Anusol HC BID. Will continue with plan of care.

## 2012-07-13 NOTE — Plan of Care (Signed)
Problem: SCI BOWEL ELIMINATION Goal: RH STG SCI MANAGE BOWEL WITH MEDICATION WITH ASSISTANCE STG SCI Manage bowel with medication with mod assistance.  Senna 2 tabs at "HS

## 2012-07-13 NOTE — Progress Notes (Signed)
Physical Therapy Session Note  Patient Details  Name: [name] Mathews MRN: [id number] Date of Birth: 05-13-1934  Today's Date: 07/13/2012 Time: 1600-1700 Time Calculation (min): 60 min  Short Term Goals: Week 1:  PT Short Term Goal 1 (Week 1): = LTGs  Skilled Therapeutic Interventions/Progress Updates:    Pt able to recall 1/3 back precautions. Bed mobility performed with min assist, cues for back precautions. Bathroom mobility with supervision, min decreased safety awareness demonstrated. Gait 2 x 120' with single point cane [facility])  supervision. Decreased Lt. LE clearance noted (secondary to decreased hip/knee flexion). Forwards toe taps to 6" step with min assist working single limb stance. [name] Mathews toss then ball kicks back/forth with daughter working on dynamic balance with narrowed base of support.   Decreased endurance throughout therapy, pt required extended and frequent rest today.    Therapy Documentation Precautions:  Precautions Precautions: Back;Fall Precaution Comments: Patient unable to recall back precautions, plan to continue education regarding precautions Required Braces or Orthoses: Spinal Brace Spinal Brace: Lumbar corset Restrictions Weight Bearing Restrictions: No Pain: Pain Assessment Pain Assessment: 0-10 Pain Score:   1 Pain Type: Chronic pain Pain Location: Knee Pain Orientation: Right Pain Descriptors: Aching Pain Onset: With Activity Pain Intervention(s): RN made aware;Other (Comment) (Rest as needed) Locomotion : Ambulation Ambulation/Gait Assistance: 5: Supervision   See FIM for current functional status  Therapy/Group: Individual Therapy  [name] Mathews 07/13/2012, 5:11 PM

## 2012-07-13 NOTE — Progress Notes (Signed)
Occupational Therapy Session Note  Patient Details  Name: [name] Mathews MRN: [id number] Date of Birth: 1934-08-22  Today's Date: 07/13/2012 Time: 1100-1200 Time Calculation (min): 60 min  Short Term Goals: Week 1:  OT Short Term Goal 1 (Week 1): Short Term Goals = Long Term Goals  Skilled Therapeutic Interventions/Progress Updates:  Upon entering room patient found supine in bed with no complaints of pain. Engaged in bed mobility for UB bathing & dressing sitting edge of bed. Educated, demonstrated, and had patient's daughter return demonstrate donning of lumbar corset. Patient then ambulated into bathroom without use of AE for toilet transfer and toileting. After toileting patient ambulated -> sink side for LB bathing & dressing. Patient used AE (reacher, sock aid, and long handled sponge) to increase independence with LB dressing. Therapist also threaded bilateral elastic shoe strings into shoes. Recommended to patient and patient's daughter, a hip kit for home usage. At end of session left patient seated in bed side chair with call bell & phone within reach. Patient's daughter, [name] Mathews, present during entire session.   Precautions:  Precautions Precautions: Back;Fall Precaution Comments: Patient unable to recall back precautions, plan to continue education regarding precautions Required Braces or Orthoses: Spinal Brace Spinal Brace: Lumbar corset Restrictions Weight Bearing Restrictions: No  See FIM for current functional status  Therapy/Group: Individual Therapy  Bolton Canupp 07/13/2012, 12:12 PM

## 2012-07-13 NOTE — Progress Notes (Signed)
Subjective/Complaints: Constipated from iron. Pain controlled. Tolerated therapy fairly well. A 12 point review of systems has been performed and if not noted above is otherwise negative.   Objective: Vital Signs: Blood pressure 149/74, pulse 94, temperature 99 F (37.2 C), temperature source Oral, resp. rate 18, height 5\' 6"  (1.676 m), weight 111.1 kg (244 lb 14.9 oz), SpO2 98.00%. No results found.  Basename 07/13/12 0610 07/12/12 0535 07/11/12 0600  WBC -- 8.6 7.8  HGB 7.5* 7.6* --  HCT 22.0* 23.2* --  PLT -- 331 278    Basename 07/11/12 0600  NA 136  K 3.7  CL 101  CO2 25  GLUCOSE 159*  BUN 13  CREATININE 1.01  CALCIUM 8.7   CBG (last 3)   Basename 07/13/12 0727 07/12/12 2101 07/12/12 1630  GLUCAP 145* 138* 126*    Wt Readings from Last 3 Encounters:  07/11/12 111.1 kg (244 lb 14.9 oz)  07/04/12 101.152 kg (223 lb)  07/04/12 101.152 kg (223 lb)    Physical Exam:  Constitutional: She is oriented to person. Thinks she's at daughter's house. She appears well-developed. Obese.  HENT:  Head: Normocephalic.  Eyes:  Pupils round and reactive to light  Neck: Neck supple. No thyromegaly present.  Cardiovascular: Normal rate, regular rhythm and normal heart sounds.  Pulmonary/Chest: Effort normal and breath sounds normal. No respiratory distress. She has no wheezes.  Occasional cough, rhonchi  Abdominal: Bowel sounds are normal. She exhibits no distension. There is no tenderness.  Musculoskeletal: She exhibits no edema.  Neurological: She is alert and oriented to person, place, and time.  Very alert. Moves all 4's.  LE 2-3 prox to 4/5 distally. No gross sensory deficits. Skin:  Back incision with blisters along incision. Incision itself is nicely healed. Psychiatric: She has a normal mood and affect. Her behavior is normal   Assessment/Plan: 1. Functional deficits secondary to Lumbar spondylolisthesis, stenosis with radiculopathy. Status post redo laminectomy  lumbar L3-4 and 4-5 with posterior lumbar interbody fusion.  which require 3+ hours per day of interdisciplinary therapy in a comprehensive inpatient rehab setting. Physiatrist is providing close team supervision and 24 hour management of active medical problems listed below. Physiatrist and rehab team continue to assess barriers to discharge/monitor patient progress toward functional and medical goals. FIM: FIM - Bathing Bathing Steps Patient Completed: Chest;Right Arm;Abdomen;Left Arm;Front perineal area;Buttocks;Right upper leg;Left upper leg Bathing: 4: Min-Patient completes 8-9 56f 10 parts or 75+ percent  FIM - Upper Body Dressing/Undressing Upper body dressing/undressing steps patient completed: Thread/unthread right bra strap;Thread/unthread left bra strap;Hook/unhook bra;Thread/unthread right sleeve of pullover shirt/dresss;Thread/unthread left sleeve of pullover shirt/dress;Put head through opening of pull over shirt/dress;Pull shirt over trunk Upper body dressing/undressing: 4: Steadying assist FIM - Lower Body Dressing/Undressing Lower body dressing/undressing: 1: Total-Patient completed less than 25% of tasks  FIM - Toileting Toileting steps completed by patient: Adjust clothing prior to toileting;Performs perineal hygiene;Adjust clothing after toileting Toileting: 4: Steadying assist  FIM - Diplomatic Services operational officer Devices: Bedside commode;Grab bars Toilet Transfers: 4-To toilet/BSC: Min A (steadying Pt. > 75%);4-From toilet/BSC: Min A (steadying Pt. > 75%)  FIM - Bed/Chair Transfer Bed/Chair Transfer: 5: Sit > Supine: Supervision (verbal cues/safety issues);5: Supine > Sit: Supervision (verbal cues/safety issues);4: Bed > Chair or W/C: Min A (steadying Pt. > 75%);4: Chair or W/C > Bed: Min A (steadying Pt. > 75%)  FIM - Locomotion: Wheelchair Locomotion: Wheelchair: 0: Activity did not occur FIM - Locomotion: Ambulation Locomotion: Ambulation Assistive  Devices: [employer] -  Straight Ambulation/Gait Assistance: 4: Min guard Locomotion: Ambulation: 4: Travels 150 ft or more with minimal assistance (Pt.>75%)  Comprehension Comprehension Mode: Auditory Comprehension: 5-Understands complex 90% of the time/Cues < 10% of the time  Expression Expression Mode: Verbal Expression: 5-Expresses complex 90% of the time/cues < 10% of the time  Social Interaction Social Interaction: 5-Interacts appropriately 90% of the time - Needs monitoring or encouragement for participation or interaction.  Problem Solving Problem Solving: 5-Solves complex 90% of the time/cues < 10% of the time  Memory Memory: 5-Recognizes or recalls 90% of the time/requires cueing < 10% of the time  Medical Problem List and Plan:  1. Lumbar spondylolisthesis, stenosis with radiculopathy. Status post redo laminectomy lumbar L3-4 and 4-5 with posterior lumbar interbody fusion 07/04/2012  2. DVT Prophylaxis/Anticoagulation: SCDs. Monitor for any signs of DVT.  3. Pain Management: Norco as needed. Monitor with increased mobility  4. Neuropsych: This patient is capable of making decisions on his/her own behalf.  5. Postoperative anemia. Continue iron supplement. Followup CBC 7.5 today  -no symptoms currently (daughter checked with pcp and baseline hgb around 11.5)  -will hold iron per pt request for now  -recheck cbc on Monday.   -will only transfuse at this point if she becomes symptomatic 6. Diabetes mellitus. Hemoglobin A1c 8.4.   -resume glyburide, hold metformin, and lantus insulin given low cbgs'7. Hypertension. Norvasc 10 mg daily, Avapro 150 mg daily. Monitor with increased mobility 8. Low grade temp:  -urine reculture still pending  LOS (Days) 3 A FACE TO FACE EVALUATION WAS PERFORMED  SWARTZ,ZACHARY T 07/13/2012, 8:04 AM

## 2012-07-14 ENCOUNTER — Inpatient Hospital Stay (HOSPITAL_COMMUNITY): Payer: Medicare Other | Admitting: Physical Therapy

## 2012-07-14 ENCOUNTER — Inpatient Hospital Stay (HOSPITAL_COMMUNITY): Payer: Medicare Other | Admitting: *Deleted

## 2012-07-14 LAB — CBC
Hemoglobin: 7.3 g/dL — ABNORMAL LOW (ref 12.0–15.0)
MCH: 26.6 pg (ref 26.0–34.0)
MCHC: 32.2 g/dL (ref 30.0–36.0)
Platelets: 420 10*3/uL — ABNORMAL HIGH (ref 150–400)
RDW: 14.8 % (ref 11.5–15.5)

## 2012-07-14 LAB — CULTURE, BLOOD (ROUTINE X 2): Culture: NO GROWTH

## 2012-07-14 LAB — URINE CULTURE: Colony Count: 30000

## 2012-07-14 NOTE — Progress Notes (Signed)
Social Work  Social Work Assessment and Plan  Patient Details  Name: [name] Mathews MRN: [id number] Date of Birth: September 19, 1934  Today's Date: 07/14/2012  Problem List:  Patient Active Problem List  Diagnosis  . HTN (hypertension)  . Diabetes mellitus  . Encephalopathy acute  . Tachycardia  . UTI (lower urinary tract infection)  . Radiculopathy   Past Medical History:  Past Medical History  Diagnosis Date  . Hyperlipidemia   . Anxiety   . Diabetes mellitus   . Arthritis   . Anemia     hx of  . Cataract     left eye  . Chronic back pain   . Hypertension     sees Dr. Margret Chance, [facility] internal medicine  . Peripheral vascular disease   . Breast lump     hx of   Past Surgical History:  Past Surgical History  Procedure Date  . Appendectomy   . Breast surgery     cyst removed left side  . Tonsillectomy   . Dilation and curettage of uterus   . Back surgery   . Colonoscopy   . Cardiovascular stress test    Social History:  reports that she has never smoked. She does not have any smokeless tobacco history on file. She reports that she does not drink alcohol or use illicit drugs.  Family / Support Systems Marital Status: Widow/Widower How Long?: 14 years Patient Roles: Parent Children: daugher, Oretha Milch [facility]) @ [phone]; daughter, Shea Swalley [facility]) @ [phone]; son, Jillyn [facility]) and Reginal Claris Gower) Anticipated Caregiver: daughters Rene Kocher initially) Ability/Limitations of Caregiver: Dtr retired, not working Caregiver Availability: 24/7 Family Dynamics: All children are very involved and supportive and communicating with one another constantly.  Social History Preferred language: English Religion: Baptist Cultural Background: NA Education: HS  Read: Yes Write: Yes Employment Status: Retired Date Retired/Disabled/Unemployed: approx. 16 years ago Age Retired: 78  Fish farm manager Issues:  None Guardian/Conservator: None   Abuse/Neglect Physical Abuse: Denies Verbal Abuse: Denies Sexual Abuse: Denies Exploitation of patient/patient's resources: Denies Self-Neglect: Denies  Emotional Status Pt's affect, behavior adn adjustment status: Pt lying in bed and answering questions appropriately, however, admits much fatigue form morning therapies.  Denies any s/s of any emotional distress - notes she receives excellent support from her family.   Recent Psychosocial Issues: None Pyschiatric History: None Substance Abuse History: None  Patient / Family Perceptions, Expectations & Goals Pt/Family understanding of illness & functional limitations: pt and family with good, basic understanding of surgery that was performed and of current functional limitations and need for CIR Premorbid pt/family roles/activities: Pt living alone (but with daughter across the street) and managing independently at home.  Family providing intermittent assistance to her as needed. Anticipated changes in roles/activities/participation: Pt may require 24/7 initially, therefore, plans in place for her to stay with family. Pt/family expectations/goals: "want to get my strength back"  [employer]: None Premorbid Home Care/DME Agencies: None Transportation available at discharge: yes  Discharge Planning Living Arrangements: Children (Dtr lived across the street PTA.) Support Systems: Children Type of Residence: Private residence Insurance Resources: Medicare;Private Insurance (specify) Wellsite geologist ) Financial Resources: Restaurant manager, fast food Screen Referred: No Living Expenses: Own Money Management: Patient Do you have any problems obtaining your medications?: No Home Management: pt Patient/Family Preliminary Plans: Pt plans to d/c home with daughter in [place] "until Dr. Lovell Sheehan say she is good to go and is finished with all her follow up appointments" Social Work  Anticipated Follow Up Needs: HH/OP Expected length of stay: 1-2 weeks  Clinical Impression Pleasant, elderly woman here after back surgery with family at bedside and very supportive.  Anticipate short LOS.  No emotional distress noted/ reported by pt or family.  Will follow for d/c planning needs.  Bernisha Verma 07/14/2012, 8:31 AM

## 2012-07-14 NOTE — Progress Notes (Signed)
Occupational Therapy Note  Patient Details  Name: JALECIA DO MRN: [id number] Date of Birth: 1934/06/06 Today's Date: 07/14/2012  Time:  4098-1191  (45 min)  1st session Individual Therapy Pain:  None    1st session:  Engaged in bathing and dressing at sink level.  Pt. Stood for 15-20 minutes during this time with supervision.  Required minimal verbal cues to adhere to back precautions, but verbally recalled 3 out of 3 back precautions.  She utilized   Sports administrator, elastic shoe laces with moderate instructional cues.  She donned lumbar corset with supervision.   Daughters , Charisse March and Velna Hatchet present.        Time:  1530-1615  (45 min) 2nd  session Individual Therapy Pain:  None Practiced with AE with shoes and socks.  Pt. Needed minimal assist mainly with getting sock over heel.  She used reacher, LH shoe horn, and sock aid with instructional cues and demonstration.  Needed minimal cues for techniques.  Daughter, Charisse March present.  Encouraged family to practice using sock aid with pt when not in therapy.    Humberto Seals 07/14/2012, 12:49 PM

## 2012-07-14 NOTE — Progress Notes (Signed)
Subjective/Complaints: Mild back pain Some weakness DTR concerned with stable anemia   Objective: Vital Signs: Blood pressure 103/61, pulse 80, temperature 99.2 F (37.3 C), temperature source Oral, resp. rate 18, height 5\' 6"  (1.676 m), weight 244 lb 14.9 oz (111.1 kg), SpO2 97.00%. No results found.  Basename 07/14/12 0625 07/13/12 0610 07/12/12 0535  WBC 6.4 -- 8.6  HGB 7.3* 7.5* --  HCT 22.7* 22.0* --  PLT 420* -- 331   CBG (last 3)   Basename 07/14/12 0735 07/13/12 2059 07/13/12 1636  GLUCAP 159* 200* 103*    Wt Readings from Last 3 Encounters:  07/11/12 244 lb 14.9 oz (111.1 kg)  07/04/12 223 lb (101.152 kg)  07/04/12 223 lb (101.152 kg)    Physical Exam:  obese female in no acute distress. Wearing back brace.  HEENT exam atraumatic, normocephalic, extraocular muscles are intact. Neck is supple. No jugular venous distention no thyromegaly. Chest clear to auscultation without increased work of breathing. Cardiac exam S1 and S2 are regular. Abdominal exam active bowel sounds.   Assessment/Plan: 1. Functional deficits secondary to Lumbar spondylolisthesis, stenosis with radiculopathy. Status post redo laminectomy lumbar L3-4 and 4-5 with posterior lumbar interbody fusion.  which require 3+ hours per day of interdisciplinary therapy in a comprehensive inpatient rehab setting. Physiatrist is providing close team supervision and 24 hour management of active medical problems listed below. Physiatrist and rehab team continue to assess barriers to discharge/monitor patient progress toward functional and medical goals.  Medical Problem List and Plan:  1. Lumbar spondylolisthesis, stenosis with radiculopathy. Status post redo laminectomy lumbar L3-4 and 4-5 with posterior lumbar interbody fusion 07/04/2012  2. DVT Prophylaxis/Anticoagulation: SCDs. Monitor for any signs of DVT.  3. Pain Management: Norco as needed. Monitor with increased mobility  4. Neuropsych: This patient is  capable of making decisions on his/her own behalf.  5. Postoperative anemia. Continue iron supplement. Followup CBC 7.3 07/14/2012   -no symptoms currently (daughter checked with pcp and baseline hgb around 11.5)  -will hold iron per pt request for now  -recheck cbc on Monday.   -will only transfuse at this point if she becomes symptomatic 6. Diabetes mellitus. Hemoglobin A1c 8.4.   -resume glyburide, hold metformin, and lantus insulin given low cbgs'7. Hypertension. Norvasc 10 mg daily, Avapro 150 mg daily. Monitor with increased mobility CBG (last 3)   Basename 07/14/12 0735 07/13/12 2059 07/13/12 1636  GLUCAP 159* 200* 103*     8. Low grade temp: resolved Urine culture with 30K enterococcus-- will follow for now   LOS (Days) 4 A FACE TO FACE EVALUATION WAS PERFORMED  SWORDS,BRUCE HENRY 07/14/2012, 9:34 AM

## 2012-07-14 NOTE — Progress Notes (Signed)
Daughter very concerned about insulin administration to the patient, explained to the daughter that staff is just following what was ordered, daughter stated that she is concerned about it. Md on call made aware, new orders were noted. Will continue to monitor.

## 2012-07-14 NOTE — Progress Notes (Signed)
Physical Therapy Note  Patient Details  Name: [name] Mathews MRN: [id number] Date of Birth: 1934-05-29 Today's Date: 07/14/2012  1000-1055 (55 minutes) group  Pain: no complaint of pain Pt participated in PT group session focused on gait safety /training/endurance. Pt ambulates 160 feet X 2 with SPC ; up/down ramp; up down single step with SPC close SBA. Pt ambulated to room from gym with SPC  SBA with vcs to look to right and left side to challenge balance.   1300-1340 (40 minutes) individual Pain: no complaint of pain Focus of treatment: Therapeutic exercises to improve tolerance to activity; gait training on steps with/without rails. Treatment: Nustep (ergonometer) Level 3 X 10 minutes LEs only for strengthening; up/down 2 steps with rail left and SPC min assist; up /down 2 steps with SPC only min assist with mod vcs for technique/sequencing .    [name],JIM 07/14/2012, 10:56 AM

## 2012-07-14 NOTE — Progress Notes (Signed)
[facility] Individual Statement of Services  Patient Name:  [name] Mathews  Date:  07/14/2012  Welcome to the [facility].  Our goal is to provide you with an individualized program based on your diagnosis and situation, designed to meet your specific needs.  With this comprehensive rehabilitation program, you will be expected to participate in at least 3 hours of rehabilitation therapies Monday-Friday, with modified therapy programming on the weekends.  Your rehabilitation program will include the following services:  Physical Therapy (PT), Occupational Therapy (OT), 24 hour per day rehabilitation nursing, Therapeutic Recreaction (TR), Case Management ( Social Worker), Rehabilitation Medicine, Nutrition Services and Pharmacy Services  Weekly team conferences will be held on Tuesdays to discuss your progress.  Your Social Worker will talk with you frequently to get your input and to update you on team discussions.  Team conferences with you and your family in attendance may also be held.  Expected length of stay: 7-10 days  Overall anticipated outcome: supervision to modified independent  Depending on your progress and recovery, your program may change.  Your  Social Worker will coordinate services and will keep you informed of any changes.  Your  Social Worker's name and contact numbers are listed  below.  The following services may also be recommended but are not provided by the Inpatient [facility]:   Driving Evaluations  Home Health Rehabiltiation Services  Outpatient Rehabilitatation [facility]  Vocational Rehabilitation   Arrangements will be made to provide these services after discharge if needed.  Arrangements include referral to agencies that provide these services.  Your insurance has been verified to be:  Medicare and Geophysicist/field seismologist Your primary doctor is:  Dr. Wynona Canes [place]  Pertinent information will be shared with  your doctor and your insurance company.  Social Worker:  [place], [place] [phone] or ([phone]  Information discussed with and copy given to patient by: Amada Jupiter, 07/14/2012, 8:47 AM

## 2012-07-15 ENCOUNTER — Inpatient Hospital Stay (HOSPITAL_COMMUNITY): Payer: Medicare Other | Admitting: *Deleted

## 2012-07-15 NOTE — Progress Notes (Signed)
Subjective/Complaints: Pt denies pain- states she is feeling stronger Mild fatigue Different dtr with her tody-DTR concerned with stable anemia   Objective: Vital Signs: Blood pressure 119/71, pulse 76, temperature 98.4 F (36.9 C), temperature source Oral, resp. rate 17, height 5\' 6"  (1.676 m), weight 244 lb 14.9 oz (111.1 kg), SpO2 98.00%. CBG (last 3)   Basename 07/15/12 0736 07/14/12 2044 07/14/12 0735  GLUCAP 172* 204* 159*    Physical Exam:  obese female in no acute distress. Wearing back brace.  HEENT exam atraumatic, normocephalic, extraocular muscles are intact. Neck is supple. No jugular venous distention no thyromegaly. Chest clear to auscultation without increased work of breathing. Cardiac exam S1 and S2 are regular. Abdominal exam active bowel sounds.   Assessment/Plan: 1. Functional deficits secondary to Lumbar spondylolisthesis, stenosis with radiculopathy. Status post redo laminectomy lumbar L3-4 and 4-5 with posterior lumbar interbody fusion.  which require 3+ hours per day of interdisciplinary therapy in a comprehensive inpatient rehab setting. Physiatrist is providing close team supervision and 24 hour management of active medical problems listed below. Physiatrist and rehab team continue to assess barriers to discharge/monitor patient progress toward functional and medical goals.  Medical Problem List and Plan:  1. Lumbar spondylolisthesis, stenosis with radiculopathy. Status post redo laminectomy lumbar L3-4 and 4-5 with posterior lumbar interbody fusion 07/04/2012  2. DVT Prophylaxis/Anticoagulation: SCDs. Monitor for any signs of DVT.  3. Pain Management: Norco as needed. Monitor with increased mobility  4. Neuropsych: This patient is capable of making decisions on his/her own behalf.  5. Postoperative anemia. (ABLA)Continue iron supplement. Followup CBC 7.3 07/15/2012 6. Diabetes mellitus. Hemoglobin A1c 8.4.   -resume glyburide, hold metformin, and lantus  insulin given low cbgs' 7. Hypertension. Norvasc 10 mg daily, Avapro 150 mg daily. Monitor with increased mobility BP Readings from Last 3 Encounters:  07/15/12 119/71  07/10/12 118/43  07/10/12 118/43    8. Low grade temp: resolved Urine culture with 30K enterococcus-- will follow sympoms for now-- there are none- recheck urine culture  LOS (Days) 5 A FACE TO FACE EVALUATION WAS PERFORMED  SWORDS,BRUCE HENRY 07/15/2012, 10:22 AM

## 2012-07-15 NOTE — Progress Notes (Signed)
Physical Therapy Session Note  Patient Details  Name: [name] Mathews MRN: [id number] Date of Birth: 1933-11-28  Today's Date: 07/15/2012 Time: 7829-5621 Time Calculation (min): 59 min  Short Term Goals: Week 1:  PT Short Term Goal 1 (Week 1): = LTGs   Skilled Therapeutic Interventions/Progress Updates:  Pt participated in UE exercise group, including Nustep x51min with bil UE/LE level 4, UE strengthening with 3# bar, adn 1 1/2 hand weights, and green bacnd for rows. Back precautions adhered to at all times. Pt needing postural cues in sitting and standing during gait . Performed gait with no device and close S 2x150' in controlled environment, including retro-walking and R/L side-stepping x20' each direction. Performed stairs x5 with 1 rail and cane with close S.      Therapy Documentation Precautions:  Precautions Precautions: Back;Fall Precaution Comments: Patient unable to recall back precautions, plan to continue education regarding precautions Required Braces or Orthoses: Spinal Brace Spinal Brace: Lumbar corset Restrictions Weight Bearing Restrictions: No    Pain: Pain Assessment Pain Assessment: No/denies pain Pain Score: 0-No pain    Locomotion : Ambulation Ambulation/Gait Assistance: 5: Supervision   See FIM for current functional status  Therapy/Group: Group Therapy  Marinus Eicher, Marcina Millard, PT 07/15/2012, 10:48 AM

## 2012-07-16 ENCOUNTER — Inpatient Hospital Stay (HOSPITAL_COMMUNITY): Payer: Medicare Other

## 2012-07-16 ENCOUNTER — Inpatient Hospital Stay (HOSPITAL_COMMUNITY): Payer: Medicare Other | Admitting: Occupational Therapy

## 2012-07-16 LAB — CBC
HCT: 24.1 % — ABNORMAL LOW (ref 36.0–46.0)
Hemoglobin: 7.7 g/dL — ABNORMAL LOW (ref 12.0–15.0)
MCH: 26.8 pg (ref 26.0–34.0)
MCHC: 32 g/dL (ref 30.0–36.0)
MCV: 84 fL (ref 78.0–100.0)
RBC: 2.87 MIL/uL — ABNORMAL LOW (ref 3.87–5.11)

## 2012-07-16 LAB — GLUCOSE, CAPILLARY
Glucose-Capillary: 137 mg/dL — ABNORMAL HIGH (ref 70–99)
Glucose-Capillary: 207 mg/dL — ABNORMAL HIGH (ref 70–99)

## 2012-07-16 MED ORDER — METFORMIN HCL 500 MG PO TABS
500.0000 mg | ORAL_TABLET | Freq: Every day | ORAL | Status: DC
  Administered 2012-07-16: 500 mg via ORAL
  Filled 2012-07-16 (×3): qty 1

## 2012-07-16 MED ORDER — CIPROFLOXACIN HCL 250 MG PO TABS
250.0000 mg | ORAL_TABLET | Freq: Two times a day (BID) | ORAL | Status: DC
  Administered 2012-07-16 – 2012-07-18 (×5): 250 mg via ORAL
  Filled 2012-07-16 (×8): qty 1

## 2012-07-16 MED ORDER — METHOCARBAMOL 500 MG PO TABS: 500.0000 mg | ORAL_TABLET | Freq: Three times a day (TID) | ORAL | Status: DC | PRN

## 2012-07-16 NOTE — Progress Notes (Signed)
Physical Therapy Session Note  Patient Details  Name: [name] Mathews MRN: [id number] Date of Birth: [birth year]  Today's Date: 07/16/2012 Time: 5284-132 Time Calculation (min): 55 min  Short Term Goals: Week 1:  PT Short Term Goal 1 (Week 1): = LTGs  Skilled Therapeutic Interventions/Progress Updates:    Gait with SPC in controlled and outdoor environment to practice community mobility, uneven terrain, opening doors, Editor, commissioning and education on energy conservation techniques overall S. SImulated car transfer with SPC mod I. Stair training for strengthening and community mobility up/down with 1 rail and cane with overall S. Nustep for functional strengthening and endurance x 10 min on level 5. Dynamic standing balance and balance reaction training on  Balance Zone without UE support while tossing and catching a ball and balancing in tandem with steady A without UE support.   Therapy Documentation Precautions:  Precautions Precautions: Back;Fall Required Braces or Orthoses: Spinal Brace Spinal Brace: Lumbar corset Restrictions Weight Bearing Restrictions: No    Pain:  No complaints. Reports discomfort at incision site but declines need for intervention.   Locomotion : Ambulation Ambulation/Gait Assistance: 5: Supervision     See FIM for current functional status  Therapy/Group: Individual Therapy  Karolee Stamps [facility] 07/16/2012, 9:22 AM

## 2012-07-16 NOTE — Progress Notes (Signed)
Request that CBG order be changed to four times per day while medications are adjusted.  Thank you.

## 2012-07-16 NOTE — Progress Notes (Signed)
Occupational Therapy Session Notes  Patient Details  Name: [name] Mathews MRN: [id number] Date of Birth: 1934/06/21  Today's Date: 07/16/2012  Short Term Goals: Week 1:  OT Short Term Goal 1 (Week 1): Short Term Goals = Long Term Goals  Skilled Therapeutic Interventions/Progress Updates:   Session #1 [phone] - 23 Minutes Individual Therapy No complaints of pain Upon entering room patient seated in recliner finishing breakfast. Treatment emphasis on ADL retraining at sink level. Focused skilled intervention on UB/LB bathing & dressing, use of AE to help increase overall independence with BADL, donning lumbar corset, sit/stands, functional ambulation throughout room with & without use of cane, toilet transfer, toileting, and overall activity tolerance/endurance. At end of session patient ambulated -> therapy gym for next therapy session.   Session #2 1330-1430 - 60 Minutes Individual Therapy No complaints of pain Patient found seated in recliner upon entering the room. Engaged in functional ambulation around hallway and throughout hospital -> 1st floor. Once on 1st floor patient requested to use restroom, took patient into community bathroom. Educated patient on safety in bathroom and importance of using handicap stall at this time. Patient then ambulated throughout hospital gift shop. Patient ambulated back up to 4000 unit and engaged in simulated tub/shower transfer on/off tub transfer bench and UE exercises using SCIFIT. Throughout session patient engaged in ambulation with & without use of cane. Patient ambulated at an overall supervision level. At end of session left patient seated in recliner with her son present in room.   Precautions:  Precautions Precautions: Back;Fall Precaution Comments: Patient unable to recall back precautions, plan to continue education regarding precautions Required Braces or Orthoses: Spinal Brace Spinal Brace: Lumbar corset Restrictions Weight Bearing  Restrictions: No  See FIM for current functional status  [name] 07/16/2012, 8:53 AM

## 2012-07-16 NOTE — Progress Notes (Signed)
Subjective/Complaints: Occasional back pain but generally under good control A 12 point review of systems has been performed and if not noted above is otherwise negative.   Objective: Vital Signs: Blood pressure 153/82, pulse 80, temperature 98 F (36.7 C), temperature source Oral, resp. rate 18, height 5\' 6"  (1.676 m), weight 111.1 kg (244 lb 14.9 oz), SpO2 94.00%. No results found.  Basename 07/16/12 0510 07/14/12 0625  WBC 9.2 6.4  HGB 7.7* 7.3*  HCT 24.1* 22.7*  PLT 480* 420*   No results found for this basename: NA:2,K:2,CL:2,CO2:2,GLUCOSE:2,BUN:2,CREATININE:2,CALCIUM:2 in the last 72 hours CBG (last 3)   Basename 07/16/12 0632 07/15/12 1959 07/15/12 0736  GLUCAP 137* 204* 172*    Wt Readings from Last 3 Encounters:  07/11/12 111.1 kg (244 lb 14.9 oz)  07/04/12 101.152 kg (223 lb)  07/04/12 101.152 kg (223 lb)    Physical Exam:  Constitutional: She is oriented to person. Thinks she's at daughter's house. She appears well-developed. Obese.  HENT:  Head: Normocephalic.  Eyes:  Pupils round and reactive to light  Neck: Neck supple. No thyromegaly present.  Cardiovascular: Normal rate, regular rhythm and normal heart sounds.  Pulmonary/Chest: Effort normal and breath sounds normal. No respiratory distress. She has no wheezes.  Occasional cough, rhonchi  Abdominal: Bowel sounds are normal. She exhibits no distension. There is no tenderness.  Musculoskeletal: She exhibits no edema.  Neurological: She is alert and oriented to person, place, and time.  Very alert. Moves all 4's.  LE 2-3 prox to 4/5 distally. No gross sensory deficits. Skin:  Back incision with blisters along incision which have dried. Incision itself is nicely healed. Psychiatric: She has a normal mood and affect. Her behavior is normal   Assessment/Plan: 1. Functional deficits secondary to Lumbar spondylolisthesis, stenosis with radiculopathy. Status post redo laminectomy lumbar L3-4 and 4-5 with  posterior lumbar interbody fusion.  which require 3+ hours per day of interdisciplinary therapy in a comprehensive inpatient rehab setting. Physiatrist is providing close team supervision and 24 hour management of active medical problems listed below. Physiatrist and rehab team continue to assess barriers to discharge/monitor patient progress toward functional and medical goals. FIM: FIM - Bathing Bathing Steps Patient Completed: Chest;Right Arm;Left Arm;Abdomen;Front perineal area;Buttocks;Right upper leg;Left upper leg Bathing: 5: Set-up assist to: Obtain items  FIM - Upper Body Dressing/Undressing Upper body dressing/undressing steps patient completed: Thread/unthread right bra strap;Thread/unthread left bra strap;Hook/unhook bra;Thread/unthread right sleeve of pullover shirt/dresss;Thread/unthread left sleeve of pullover shirt/dress;Put head through opening of pull over shirt/dress;Pull shirt over trunk Upper body dressing/undressing: 5: Supervision: Safety issues/verbal cues FIM - Lower Body Dressing/Undressing Lower body dressing/undressing steps patient completed: Thread/unthread left underwear leg;Pull underwear up/down;Thread/unthread right pants leg;Thread/unthread right underwear leg;Thread/unthread left pants leg;Pull pants up/down;Don/Doff right sock;Don/Doff left sock;Don/Doff right shoe;Don/Doff left shoe;Fasten/unfasten right shoe;Fasten/unfasten left shoe Lower body dressing/undressing: 5: Supervision: Safety issues/verbal cues  FIM - Toileting Toileting steps completed by patient: Adjust clothing prior to toileting;Performs perineal hygiene;Adjust clothing after toileting Toileting Assistive Devices: Grab bar or rail for support Toileting: 5: Supervision: Safety issues/verbal cues  FIM - Diplomatic Services operational officer Devices: Bedside commode;Walker;Grab bars Toilet Transfers: 0-Activity did not occur  FIM - Banker  Devices: Arm rests Bed/Chair Transfer: 5: Chair or W/C > Bed: Supervision (verbal cues/safety issues);5: Bed > Chair or W/C: Supervision (verbal cues/safety issues)  FIM - Locomotion: Wheelchair Locomotion: Wheelchair: 1: Total Assistance/staff pushes wheelchair (Pt<25%) FIM - Locomotion: Ambulation Locomotion: Ambulation Assistive Devices: Emergency planning/management officer Ambulation/Gait  Assistance: 5: Supervision Locomotion: Ambulation: 5: Travels 150 ft or more with supervision/safety issues  Comprehension Comprehension Mode: Auditory Comprehension: 6-Follows complex conversation/direction: With extra time/assistive device  Expression Expression Mode: Verbal Expression: 5-Expresses complex 90% of the time/cues < 10% of the time  Social Interaction Social Interaction: 6-Interacts appropriately with others with medication or extra time (anti-anxiety, antidepressant).  Problem Solving Problem Solving: 5-Solves complex 90% of the time/cues < 10% of the time  Memory Memory: 5-Recognizes or recalls 90% of the time/requires cueing < 10% of the time  Medical Problem List and Plan:  1. Lumbar spondylolisthesis, stenosis with radiculopathy. Status post redo laminectomy lumbar L3-4 and 4-5 with posterior lumbar interbody fusion 07/04/2012  2. DVT Prophylaxis/Anticoagulation: SCDs. Monitor for any signs of DVT.  3. Pain Management: dc norco. Utilize tylenol, ice. Monitor with increased mobility  4. Neuropsych: This patient is capable of making decisions on his/her own behalf.  5. Postoperative anemia. Continues to slowly improvet. Followup CBC 7.7 today  -no symptoms currently (daughter checked with pcp and baseline hgb around 11.5)  -pt is not symptomatic 6. Diabetes mellitus. Hemoglobin A1c 8.4.   -resume glyburide, resume metformin, and lantus insulin given low cbgs'7. Hypertension. Norvasc 10 mg daily, Avapro 150 mg daily. Monitor with increased mobility 8. Low grade temp:  -urine reculture with  enterococcus  -cipro for 5 days  LOS (Days) 6 A FACE TO FACE EVALUATION WAS PERFORMED  SWARTZ,ZACHARY T 07/16/2012, 6:58 AM

## 2012-07-17 ENCOUNTER — Ambulatory Visit (HOSPITAL_COMMUNITY): Payer: Medicare Other | Admitting: Physical Therapy

## 2012-07-17 ENCOUNTER — Inpatient Hospital Stay (HOSPITAL_COMMUNITY): Payer: Medicare Other

## 2012-07-17 ENCOUNTER — Encounter (HOSPITAL_COMMUNITY): Payer: Medicare Other | Admitting: Occupational Therapy

## 2012-07-17 LAB — GLUCOSE, CAPILLARY: Glucose-Capillary: 241 mg/dL — ABNORMAL HIGH (ref 70–99)

## 2012-07-17 MED ORDER — GLYBURIDE 5 MG PO TABS
5.0000 mg | ORAL_TABLET | Freq: Two times a day (BID) | ORAL | Status: DC
  Administered 2012-07-17 – 2012-07-18 (×2): 5 mg via ORAL
  Filled 2012-07-17 (×4): qty 1

## 2012-07-17 MED ORDER — METFORMIN HCL 500 MG PO TABS
500.0000 mg | ORAL_TABLET | Freq: Two times a day (BID) | ORAL | Status: DC
  Administered 2012-07-17 – 2012-07-18 (×2): 500 mg via ORAL
  Filled 2012-07-17 (×4): qty 1

## 2012-07-17 NOTE — Progress Notes (Signed)
Physical Therapy Session Note  Patient Details  Name: [name] Mathews MRN: [id number] Date of Birth: 04-Jul-1934  Today's Date: 07/17/2012 Time: 1500-1600 Time Calculation (min): 60 min    Skilled Therapeutic Interventions/Progress Updates:    Pt participated in 60 min group therapy. Pt performed 10 min level 4 NuStep with rest as needed, pt only using bil. LEs to avoid "twisting" precaution. Obstacle course performed x 4 reps working on stepping over obstacles, negotiating obstacles, and dynamic balance over a compliant surface performed with supervision. Pt also performed long arc quads and marching with ankle weights (1# for bil. LE) performed in 10 set reps x 10 min. Gait 2 x 150' with no device modified independent in controlled environment.   Therapy Documentation Precautions:  Precautions Precautions: Back;Fall Precaution Comments: Patient able to recall and adhere to 3/3 back precautions Required Braces or Orthoses: Spinal Brace Spinal Brace: Lumbar corset Restrictions Weight Bearing Restrictions: No Pain: Pain Assessment Pain Assessment: No/denies pain   See FIM for current functional status  Therapy/Group: Group Therapy  Wilhemina Bonito 07/17/2012, 5:55 PM

## 2012-07-17 NOTE — Progress Notes (Signed)
Subjective/Complaints: Feels well. No complaints today A 12 point review of systems has been performed and if not noted above is otherwise negative.   Objective: Vital Signs: Blood pressure 140/66, pulse 72, temperature 98.8 F (37.1 C), temperature source Oral, resp. rate 16, height 5\' 6"  (1.676 m), weight 111.1 kg (244 lb 14.9 oz), SpO2 98.00%. No results found.  Basename 07/16/12 0510  WBC 9.2  HGB 7.7*  HCT 24.1*  PLT 480*   No results found for this basename: NA:2,K:2,CL:2,CO2:2,GLUCOSE:2,BUN:2,CREATININE:2,CALCIUM:2 in the last 72 hours CBG (last 3)   Basename 07/16/12 2131 07/16/12 0632 07/15/12 1959  GLUCAP 207* 137* 204*    Wt Readings from Last 3 Encounters:  07/11/12 111.1 kg (244 lb 14.9 oz)  07/04/12 101.152 kg (223 lb)  07/04/12 101.152 kg (223 lb)    Physical Exam:  Constitutional: She is oriented to person. Thinks she's at daughter's house. She appears well-developed. Obese.  HENT:  Head: Normocephalic.  Eyes:  Pupils round and reactive to light  Neck: Neck supple. No thyromegaly present.  Cardiovascular: Normal rate, regular rhythm and normal heart sounds.  Pulmonary/Chest: Effort normal and breath sounds normal. No respiratory distress. She has no wheezes.  Occasional cough, rhonchi  Abdominal: Bowel sounds are normal. She exhibits no distension. There is no tenderness.  Musculoskeletal: She exhibits no edema.  Neurological: She is alert and oriented to person, place, and time.  Very alert. Moves all 4's.  LE 2-3 prox to 4/5 distally. No gross sensory deficits. Skin:  Back incision with blisters along incision which have dried. Incision itself is nicely healed. Psychiatric: She has a normal mood and affect. Her behavior is normal   Assessment/Plan: 1. Functional deficits secondary to Lumbar spondylolisthesis, stenosis with radiculopathy. Status post redo laminectomy lumbar L3-4 and 4-5 with posterior lumbar interbody fusion.  which require 3+ hours  per day of interdisciplinary therapy in a comprehensive inpatient rehab setting. Physiatrist is providing close team supervision and 24 hour management of active medical problems listed below. Physiatrist and rehab team continue to assess barriers to discharge/monitor patient progress toward functional and medical goals. FIM: FIM - Bathing Bathing Steps Patient Completed: Chest;Right Arm;Abdomen;Left Arm;Front perineal area;Buttocks;Right upper leg;Left upper leg;Right lower leg (including foot);Left lower leg (including foot) Bathing: 5: Supervision: Safety issues/verbal cues  FIM - Upper Body Dressing/Undressing Upper body dressing/undressing steps patient completed: Thread/unthread right bra strap;Thread/unthread left bra strap;Hook/unhook bra;Thread/unthread right sleeve of pullover shirt/dresss;Thread/unthread left sleeve of pullover shirt/dress;Put head through opening of pull over shirt/dress;Pull shirt over trunk Upper body dressing/undressing: 7: Complete Independence: No helper FIM - Lower Body Dressing/Undressing Lower body dressing/undressing steps patient completed: Thread/unthread right underwear leg;Thread/unthread left underwear leg;Pull underwear up/down;Thread/unthread right pants leg;Thread/unthread left pants leg;Don/Doff right sock;Don/Doff left sock;Pull pants up/down;Don/Doff right shoe;Don/Doff left shoe;Fasten/unfasten right shoe;Fasten/unfasten left shoe Lower body dressing/undressing: 4: Min-Patient completed 75 plus % of tasks (adjust shoes)  FIM - Toileting Toileting steps completed by patient: Adjust clothing prior to toileting;Performs perineal hygiene;Adjust clothing after toileting Toileting Assistive Devices: Grab bar or rail for support Toileting: 5: Supervision: Safety issues/verbal cues  FIM - Diplomatic Services operational officer Devices: Bedside commode;Grab bars Toilet Transfers: 5-To toilet/BSC: Supervision (verbal cues/safety issues);5-From  toilet/BSC: Supervision (verbal cues/safety issues)  FIM - Press photographer Assistive Devices: Arm rests;Cane Bed/Chair Transfer: 6: Bed > Chair or W/C: No assist;6: Chair or W/C > Bed: No assist  FIM - Locomotion: Wheelchair Locomotion: Wheelchair: 0: Activity did not occur FIM - Locomotion: Ambulation Locomotion: Health visitor  Devices: Emergency planning/management officer Ambulation/Gait Assistance: 5: Supervision Locomotion: Ambulation: 5: Travels 150 ft or more with supervision/safety issues  Comprehension Comprehension Mode: Auditory Comprehension: 6-Follows complex conversation/direction: With extra time/assistive device  Expression Expression Mode: Verbal Expression: 6-Expresses complex ideas: With extra time/assistive device  Social Interaction Social Interaction: 6-Interacts appropriately with others with medication or extra time (anti-anxiety, antidepressant).  Problem Solving Problem Solving: 6-Solves complex problems: With extra time  Memory Memory: 6-More than reasonable amt of time  Medical Problem List and Plan:  1. Lumbar spondylolisthesis, stenosis with radiculopathy. Status post redo laminectomy lumbar L3-4 and 4-5 with posterior lumbar interbody fusion 07/04/2012  2. DVT Prophylaxis/Anticoagulation: SCDs. Monitor for any signs of DVT.  3. Pain Management: dc norco. Utilize tylenol, ice. Monitor with increased mobility  4. Neuropsych: This patient is capable of making decisions on his/her own behalf.  5. Postoperative anemia. Continues to slowly improvet. Followup CBC 7.7 today  -no symptoms currently (daughter checked with pcp and baseline hgb around 11.5)  -pt is not symptomatic 6. Diabetes mellitus. Hemoglobin A1c 8.4.   -resume glyburide, resume metformin----increase to bid---no lantus insulin at present '7. Hypertension. Norvasc 10 mg daily, Avapro 150 mg daily. Monitor with increased mobility 8. Low grade temp:  -urine reculture with  enterococcus only 30k but has had frequency and low grade temp  -cipro for 5 days total  LOS (Days) 7 A FACE TO FACE EVALUATION WAS PERFORMED  SWARTZ,ZACHARY T 07/17/2012, 8:09 AM

## 2012-07-17 NOTE — Progress Notes (Addendum)
Occupational Therapy Session Notes & Discharge Summary  Patient Details  Name: [name] Mathews MRN: [id number] Date of Birth: [birth year]  Today's Date: 07/17/2012 Time: 4098-1191 Time Calculation (min): 45 min Individual Therapy No complaints of pain Treatment emphasis on UB/LB bathing at sink level and dressing in sit->stand position. Patient performed at an overall modified independent -> independent level. Patient did not use any AE to assist with bathing & dressing, nor did patient use cane or an AD to assist when ambulating. Focused skilled intervention on overall activity tolerance/endurance, donning of lumbar corset, UB/LB bathing & dressing, and functional ambulation throughout hallway without use of AD/cane.  ----------------------------------------------------------------------------------------------------------------------------------  DISCHARGE SUMMARY Patient has met 10 of 11 long term goals due to improved activity tolerance, improved balance, postural control, ability to compensate for deficits, improved attention, improved awareness and improved coordination.  Patient to discharge at overall Modified Independent level.  Patient's care partner is independent to provide the necessary supervision prn assistance at discharge. Patient exceeded goals of supervision.  Reasons goals not met: Patient did not meet simple meal prep goal. During CIR stay patient refused to participate in kitchen tasks.  Recommendation: No additional occupational therapy recommended at this time. Patient performing at an overall modified independent level for BADLs.   Equipment: Tub transfer bench, patient has BSC at home per her report.  Reasons for discharge: treatment goals met and discharge from hospital  Patient/family agrees with progress made and goals achieved: Yes  Precautions/Restrictions  Precautions Precautions: Back;Fall Precaution Comments: Patient able to recall and adhere to 3/3 back  precautions Required Braces or Orthoses: Spinal Brace Spinal Brace: Lumbar corset Restrictions Weight Bearing Restrictions: No  Pain Pain Assessment Pain Assessment: No/denies pain Pain Score: 0-No pain Faces Pain Scale: No hurt Pain Type: Surgical pain Pain Location: Back Pain Descriptors: Aching Pain Intervention(s): Medication (See eMAR)  ADL - See FIM  Vision/Perception  Vision - History Baseline Vision: Wears glasses all the time Patient Visual Report: No change from baseline Vision - Assessment Eye Alignment: Within Functional Limits Perception Perception: Within Functional Limits Praxis Praxis: Intact   Cognition Overall Cognitive Status: Appears within functional limits for tasks assessed Arousal/Alertness: Awake/alert Orientation Level: Oriented X4 Memory: Appears intact Awareness: Appears intact Problem Solving: Appears intact Safety/Judgment: Appears intact  Sensation Sensation Light Touch: Appears Intact Additional Comments: bilateral UEs appear intact Coordination Gross Motor Movements are Fluid and Coordinated: Yes Fine Motor Movements are Fluid and Coordinated: Yes  Motor  Motor Motor: Within Functional Limits  Mobility - See Discharge Navigator  Trunk/Postural Assessment - See Discharge Navigator  Balance Berg Balance Test Sit to Stand: Able to stand without using hands and stabilize independently Standing Unsupported: Able to stand safely 2 minutes Sitting with Back Unsupported but Feet Supported on Floor or Stool: Able to sit safely and securely 2 minutes Stand to Sit: Sits safely with minimal use of hands Transfers: Able to transfer safely, minor use of hands Standing Unsupported with Eyes Closed: Able to stand 10 seconds safely Standing Ubsupported with Feet Together: Able to place feet together independently and stand 1 minute safely From Standing, Reach Forward with Outstretched Arm: Can reach forward >5 cm safely (2") (limited due  to precautions) From Standing Position, Pick up Object from Floor: Unable to try/needs assist to keep balance (NT due to precautions) From Standing Position, Turn to Look Behind Over each Shoulder: Turn sideways only but maintains balance (limited due to back precautions) Turn 360 Degrees: Able to turn 360 degrees safely but  slowly Standing Unsupported, Alternately Place Feet on Step/Stool: Able to stand independently and complete 8 steps >20 seconds Standing Unsupported, One Foot in Front: Able to plae foot ahead of the other independently and hold 30 seconds Standing on One Leg: Tries to lift leg/unable to hold 3 seconds but remains standing independently Total Score: 41  Static Sitting Balance Static Sitting - Level of Assistance: 7: Independent Dynamic Sitting Balance Dynamic Sitting - Level of Assistance: 6: Modified independent (Device/Increase time) Static Standing Balance Static Standing - Level of Assistance: 6: Modified independent (Device/Increase time) Dynamic Standing Balance Dynamic Standing - Level of Assistance: 6: Modified independent (Device/Increase time)  Extremity/Trunk Assessment RUE Assessment RUE Assessment: Within Functional Limits LUE Assessment LUE Assessment: Within Functional Limits  See FIM for current functional status  Domanick Cuccia 07/17/2012, 12:18 PM

## 2012-07-17 NOTE — Progress Notes (Signed)
Physical Therapy Discharge Summary  Patient Details  Name: [name] Mathews MRN: [id number] Date of Birth: 1934-02-04  Today's Date: 07/17/2012  Session #1 Time: 1130-1200 Time Calculation (min): 30 min Individual therapy Administered Berg Balance test to which pt demonstrated significant improvement (score increased by 21 points) and discussed results with pt who verbalized understanding. Also educated that some items on test cannot be scored accurately due to pt's back precautions limiting certain movements. Stair training for community mobility and home entry mod I with bilateral rails, pt demonstrates excellent safety awareness with all mobility. Gait to/from therapy mod I without AD and carrying clipboard to simulate functional tasks in the home. Made pt mod I in the room.  Session #2 Time: 1300-1340 Time Calculation: 40 minutes Individual therapy Treatment session with focus on dynamic gait training in controlled and home environment involving side stepping, retro gait, throwing and catching a ball while walking forwards and sideways, carrying items, and maintaining back precautions. Discussed recommendation for follow up OPPT as a bridge for her to be able to become more independent in order to move back into her house and live alone. Pt at overall mod I level with basic tasks, but reminded pt that hospital is a controlled environment, and re-entering the community at an independent level is different. Pt verbalized understanding and in agreement.   Patient has met 8 of 8 long term goals due to improved activity tolerance, improved balance and improved endurance.  Patient to discharge at an ambulatory level Modified Independent.   Patient's daughter is independent to provide the necessary supervision assistance at discharge when needed. Recommend supervision for community mobility and use of SPC. Pt has made excellent progress during CIR stay,  Reasons goals not met: n/a  Recommendation:    Patient will benefit from ongoing skilled PT services in outpatient setting to continue to advance safe functional mobility, address ongoing impairments in gait speed, high level balance activities, muscular endurance, and minimize fall risk.  Equipment: No equipment provided. Pt already owns [facility] for community mobility.  Reasons for discharge: treatment goals met and discharge from hospital  Patient/family agrees with progress made and goals achieved: Yes  PT Discharge Precautions/Restrictions Precautions Precautions: Back;Fall Precaution Comments: Patient able to recall and adhere to 3/3 back precautions Required Braces or Orthoses: Spinal Brace Spinal Brace: Lumbar corset Restrictions Weight Bearing Restrictions: No Pain Pain Assessment Pain Assessment: No/denies pain Pain Score: 0-No pain Faces Pain Scale: No hurt Pain Type: Surgical pain Pain Location: Back Pain Descriptors: Aching Pain Intervention(s): Medication (See eMAR) Vision/Perception  Vision - History Baseline Vision: Wears glasses all the time Patient Visual Report: No change from baseline Vision - Assessment Eye Alignment: Within Functional Limits Perception Perception: Within Functional Limits Praxis Praxis: Intact  Cognition Overall Cognitive Status: Appears within functional limits for tasks assessed Arousal/Alertness: Awake/alert Orientation Level: Oriented X4 Memory: Appears intact Awareness: Appears intact Problem Solving: Appears intact Safety/Judgment: Appears intact Sensation Sensation Light Touch: Appears Intact Additional Comments: bilateral UEs appear intact Coordination Gross Motor Movements are Fluid and Coordinated: Yes Fine Motor Movements are Fluid and Coordinated: Yes Motor  Motor Motor: Within Functional Limits    Locomotion  Ambulation Ambulation/Gait Assistance: 6: Modified independent (Device/Increase time)  Trunk/Postural Assessment  Cervical Assessment Cervical  Assessment: Within Functional Limits Thoracic Assessment Thoracic Assessment: Within Functional Limits Lumbar Assessment Lumbar Assessment:  (back precautions) Postural Control Postural Control: Within Functional Limits  Balance Berg Balance Test Sit to Stand: Able to stand without using hands and stabilize  independently Standing Unsupported: Able to stand safely 2 minutes Sitting with Back Unsupported but Feet Supported on Floor or Stool: Able to sit safely and securely 2 minutes Stand to Sit: Sits safely with minimal use of hands Transfers: Able to transfer safely, minor use of hands Standing Unsupported with Eyes Closed: Able to stand 10 seconds safely Standing Ubsupported with Feet Together: Able to place feet together independently and stand 1 minute safely From Standing, Reach Forward with Outstretched Arm: Can reach forward >5 cm safely (2") (limited due to precautions) From Standing Position, Pick up Object from Floor: Unable to try/needs assist to keep balance (NT due to precautions) From Standing Position, Turn to Look Behind Over each Shoulder: Turn sideways only but maintains balance (limited due to back precautions) Turn 360 Degrees: Able to turn 360 degrees safely but slowly Standing Unsupported, Alternately Place Feet on Step/Stool: Able to stand independently and complete 8 steps >20 seconds Standing Unsupported, One Foot in Front: Able to plae foot ahead of the other independently and hold 30 seconds Standing on One Leg: Tries to lift leg/unable to hold 3 seconds but remains standing independently Total Score: 41  Static Sitting Balance Static Sitting - Level of Assistance: 7: Independent Dynamic Sitting Balance Dynamic Sitting - Level of Assistance: 6: Modified independent (Device/Increase time) Static Standing Balance Static Standing - Level of Assistance: 6: Modified independent (Device/Increase time) Dynamic Standing Balance Dynamic Standing - Level of Assistance:  6: Modified independent (Device/Increase time) Extremity Assessment  RUE Assessment RUE Assessment: Within Functional Limits LUE Assessment LUE Assessment: Within Functional Limits RLE Assessment RLE Assessment: Within Functional Limits LLE Assessment LLE Assessment: Within Functional Limits  See FIM for current functional status  Karolee Stamps [facility] 07/17/2012, 12:20 PM

## 2012-07-18 DIAGNOSIS — M48061 Spinal stenosis, lumbar region without neurogenic claudication: Secondary | ICD-10-CM

## 2012-07-18 DIAGNOSIS — N390 Urinary tract infection, site not specified: Secondary | ICD-10-CM

## 2012-07-18 DIAGNOSIS — Z5189 Encounter for other specified aftercare: Secondary | ICD-10-CM

## 2012-07-18 DIAGNOSIS — IMO0002 Reserved for concepts with insufficient information to code with codable children: Secondary | ICD-10-CM

## 2012-07-18 MED ORDER — METHOCARBAMOL 500 MG PO TABS: 500.0000 mg | ORAL_TABLET | Freq: Three times a day (TID) | ORAL | Status: DC | PRN

## 2012-07-18 MED ORDER — SENNOSIDES-DOCUSATE SODIUM 8.6-50 MG PO TABS: 2.0000 | ORAL_TABLET | Freq: Every day | ORAL | Status: DC

## 2012-07-18 MED ORDER — PANTOPRAZOLE SODIUM 40 MG PO TBEC: 40.0000 mg | DELAYED_RELEASE_TABLET | Freq: Every day | ORAL | Status: DC

## 2012-07-18 MED ORDER — CIPROFLOXACIN HCL 250 MG PO TABS: 250.0000 mg | ORAL_TABLET | Freq: Two times a day (BID) | ORAL | Status: DC

## 2012-07-18 NOTE — Discharge Summary (Signed)
  Discharge summary job [id number]

## 2012-07-18 NOTE — Progress Notes (Signed)
Discharge Note  The overall goal for the admission was met for:   Discharge location: Yes - home with daughter (and other children to share in support)  Length of Stay: Yes - 8 days  Discharge activity level: Yes - modified independent overall  Home/community participation: Yes  Services provided included: MD, RD, PT, OT, RN, TR, Pharmacy and SW  Financial Services: Medicare and Private Insurance: [place]  Follow-up services arranged: Outpatient: PT via [facility] DME: tub bench via [facility] and Patient/Family has no preference for HH/DME agencies  Comments (or additional information):  Patient/Family verbalized understanding of follow-up arrangements: Yes  Individual responsible for coordination of the follow-up plan: patient  Confirmed correct DME delivered: Lasasha Brophy 07/18/2012    Waleska Buttery

## 2012-07-18 NOTE — Discharge Summary (Signed)
[name], [name]:  [id number]  MEDICAL RECORD NO.:  [id number]  LOCATION:  4147                         FACILITY:  [facility]  PHYSICIAN:  Ranelle Oyster, M.D.DATE OF BIRTH:  Mar 14, 1934  DATE OF ADMISSION:  07/10/2012 DATE OF DISCHARGE:  07/18/2012                              DISCHARGE SUMMARY   DISCHARGE DIAGNOSES: 1. Lumbar spondylolisthesis, stenosis with radiculopathy, status post     redo lumbar laminectomy, July 04, 2012. 2. Sequential compression devices for deep vein thrombosis     prophylaxis. 3. Pain management. 4. Postoperative anemia. 5. Diabetes mellitus. 6. Hypertension. 7. Enterococcal urinary tract infection.  HISTORY OF PRESENT ILLNESS:  This is a 78 year old right-handed female with history of diabetes mellitus with peripheral neuropathy, and decompressive lumbar laminectomy in 1994 who was admitted July 04, 2012, with progressive low back pain radiating to the lower extremity. She denied any bowel or bladder disturbances.  Lumbar myelogram demonstrated multilevel degenerative changes, stenosis, spondylolisthesis with radiculopathy.  Underwent redo laminectomy at lumbar L3-4 and L4-5 to decompress bilaterally of nerve roots with posterior lumbar interbody fusion July 04, 2012, per Dr. Lovell Sheehan. Fitted with Aspen lumbar fusion brace.  Postoperative pain control. Acute anemia blood loss with hemoglobin around 8 and monitored.  Urine culture July 08, 2012, for low-grade temp, showed no growth.  Chest x-ray with no edema or effusions.  Sequential compression devices were in place for DVT prophylaxis.  The patient was admitted for comprehensive rehab program.  PAST MEDICAL HISTORY:  See discharge diagnoses.  SOCIAL HISTORY:  Lives alone.  She has a daughter who can provide assistance with good supportive family.  The patient can stay on the main level of her house.  FUNCTIONAL HISTORY PRIOR TO ADMISSION:  Independent  driving, retired.  FUNCTIONAL STATUS UPON ADMISSION TO REHAB SERVICES:  +2 total assist for stand, pivot transfers, minimal assist to ambulate 190 feet with a rolling walker.  PHYSICAL EXAMINATION:  VITAL SIGNS:  Blood pressure 130/78, pulse 83, temperature 98, respirations 18. GENERAL:  This was an alert female, oriented to person, place. EYES:  Pupils round and reactive to light. LUNGS:  Clear to auscultation. CARDIAC:  Regular rate and rhythm. ABDOMEN:  Soft, nontender.  Good bowel sounds. BACK:  Incision was dressed with dry dressing.  Brace was undone in bed.  REHABILITATION HOSPITAL COURSE:  The patient was admitted to Inpatient Rehab Services with therapies initiated on a 3-hour daily basis consisting of physical therapy, occupational therapy, and rehabilitation nursing.  The following issues were addressed during the patient's rehabilitation stay.  Pertaining to Ms. Mathews's lumbar spondylolisthesis with radiculopathy, she had undergone redo laminectomy lumbar L3-4, L4-5 per Dr. Lovell Sheehan with back brace when out of bed. Surgical site was healing nicely.  She would follow up with Neurosurgery.  Sequential compression devices were in place for DVT prophylaxis.  The patient was also on an aspirin 81 mg daily.  Pain management with the use of Robaxin for muscle spasms.  Her Norco was discontinued due to some early bouts of confusion which had greatly improved.  She was utilizing Tylenol with good results.  Postoperative anemia monitored very closely.  Latest hemoglobin. 8.3 on  07/18/2012.  Her hemoglobin over the last 1 week had remained stable between 7.3 and 8.0.  She exhibited no signs or orthostatic hypotension.  No shortness of breath noted.  No tachycardia.  Issues in relation to her anemia were discussed at length with family.  She was not transfused.  Her blood sugars remained monitored, she had a hemoglobin A1c of 8.4.  She remained on oral agents and would follow up  with her primary care provider.  She remained on amlodipine for blood pressure control as well as Avapro.  A urine culture did show 30,000 Enterococcus, she was placed on Cipro x5 days.  She denied any dysuria.  The patient received weekly collaborative interdisciplinary team conferences to discuss estimated length of stay, family teaching, and any barriers to discharge.  She was continent of bowel and bladder, supervision to modified independence for overall mobility.  Her strength and endurance had greatly improved. Full family teaching was completed and plans to be discharged to home July 18, 2012, with family with ongoing therapies dictated as per [employer].  DISCHARGE MEDICATIONS: 1. Robaxin 500 mg every 8 hours as needed for muscle spasms. 2. Tylenol 650 mg p.o. every 4 hours as needed for pain or fever. 3. Norvasc 10 mg daily. 4. Aspirin 81 mg daily. 5. Cipro 250 mg b.i.d. x4 more days. 6. Glyburide 5 mg b.i.d. 7. Anusol cream twice daily as needed for hemorrhoids. 8. Avapro 150 mg p.o. daily. 9. Claritin 10 mg daily. 10.Glucophage 500 mg b.i.d. 11.Protonix 40 mg daily. 12.Senokot tablets 2 at bedtime. 13.Vitamin D 50,000 units every 14 days.  DIET:  Diabetic diet.  SPECIAL INSTRUCTIONS:  Back brace when out of bed.  The patient should follow up with her primary care provider in relation to medical management of hypertension, diabetes mellitus.  She should have a followup CBC in approximately 1 week to monitor postoperative anemia after back surgery. She should follow up with Dr. Tressie Stalker, Neurosurgery, [phone] two weeks call for appointment.  Follow up Dr. Faith Rogue at the Outpatient Rehab Service office as needed.  Home Health therapies had been arranged as per [employer].  Patient's primary M.D. is not local. Patient will be staying in [facility] with her daughter until more mobile. Patient will need another CBC in one week to  monitor postoperative anemia. Will discuss with daughter on attempts to have daughter's primary doctor follow up hemoglobin versus followup with M.D. Dr. Riley Kill with CBC results     [name] Mathews, P.A.   ______________________________ Ranelle Oyster, M.D.    DA/MEDQ  D:  07/18/2012  T:  07/18/2012  Job:  [id number]  cc:   Cristi Loron, M.D.

## 2012-07-18 NOTE — Progress Notes (Signed)
Subjective/Complaints: Feels well. No complaints today. Excited to go home. A 12 point review of systems has been performed and if not noted above is otherwise negative.   Objective: Vital Signs: Blood pressure 130/77, pulse 79, temperature 98.1 F (36.7 C), temperature source Oral, resp. rate 20, height 5\' 6"  (1.676 m), weight 111.1 kg (244 lb 14.9 oz), SpO2 98.00%. No results found.  Basename 07/18/12 0549 07/16/12 0510  WBC -- 9.2  HGB 8.3* 7.7*  HCT 25.8* 24.1*  PLT -- 480*   No results found for this basename: NA:2,K:2,CL:2,CO2:2,GLUCOSE:2,BUN:2,CREATININE:2,CALCIUM:2 in the last 72 hours CBG (last 3)   Basename 07/18/12 0728 07/17/12 2051 07/17/12 0826  GLUCAP 156* 165* 241*    Wt Readings from Last 3 Encounters:  07/11/12 111.1 kg (244 lb 14.9 oz)  07/04/12 101.152 kg (223 lb)  07/04/12 101.152 kg (223 lb)    Physical Exam:  Constitutional: She is oriented to person. Thinks she's at daughter's house. She appears well-developed. Obese.  HENT:  Head: Normocephalic.  Eyes:  Pupils round and reactive to light  Neck: Neck supple. No thyromegaly present.  Cardiovascular: Normal rate, regular rhythm and normal heart sounds.  Pulmonary/Chest: Effort normal and breath sounds normal. No respiratory distress. She has no wheezes.  Occasional cough, rhonchi  Abdominal: Bowel sounds are normal. She exhibits no distension. There is no tenderness.  Musculoskeletal: She exhibits no edema.  Neurological: She is alert and oriented to person, place, and time.  Very alert. Moves all 4's.  LE 2-3 prox to 4/5 distally. No gross sensory deficits. Skin:  Back incision with blisters along incision which have dried. Incision itself is nicely healed. Psychiatric: She has a normal mood and affect. Her behavior is normal   Assessment/Plan: 1. Functional deficits secondary to Lumbar spondylolisthesis, stenosis with radiculopathy. Status post redo laminectomy lumbar L3-4 and 4-5 with  posterior lumbar interbody fusion.  which require 3+ hours per day of interdisciplinary therapy in a comprehensive inpatient rehab setting. Physiatrist is providing close team supervision and 24 hour management of active medical problems listed below. Physiatrist and rehab team continue to assess barriers to discharge/monitor patient progress toward functional and medical goals.  Dc home today with outpt follow up. Goals met FIM: FIM - Bathing Bathing Steps Patient Completed: Chest;Right Arm;Left Arm;Abdomen;Front perineal area;Buttocks;Right upper leg;Left upper leg;Right lower leg (including foot);Left lower leg (including foot) Bathing: 6: Assistive device (Comment)  FIM - Upper Body Dressing/Undressing Upper body dressing/undressing steps patient completed: Thread/unthread right bra strap;Thread/unthread left bra strap;Hook/unhook bra;Thread/unthread right sleeve of pullover shirt/dresss;Put head through opening of pull over shirt/dress;Thread/unthread left sleeve of pullover shirt/dress;Pull shirt over trunk Upper body dressing/undressing: 7: Complete Independence: No helper (even donning lumbar corset) FIM - Lower Body Dressing/Undressing Lower body dressing/undressing steps patient completed: Thread/unthread right underwear leg;Thread/unthread left underwear leg;Pull underwear up/down;Thread/unthread right pants leg;Thread/unthread left pants leg;Pull pants up/down;Don/Doff right sock;Don/Doff left sock;Don/Doff right shoe;Don/Doff left shoe;Fasten/unfasten right shoe;Fasten/unfasten left shoe Lower body dressing/undressing: 6: Assistive device (Comment)  FIM - Toileting Toileting steps completed by patient: Adjust clothing prior to toileting;Performs perineal hygiene;Adjust clothing after toileting Toileting Assistive Devices: Grab bar or rail for support Toileting: 6: Assistive device: No helper  FIM - Diplomatic Services operational officer Devices: Psychiatrist  Transfers: 6-Assistive device: No helper  FIM - Banker Devices: Arm rests;Cane Bed/Chair Transfer: 6: Bed > Chair or W/C: No assist;6: Chair or W/C > Bed: No assist  FIM - Locomotion: Wheelchair Locomotion: Wheelchair: 0: Activity did  not occur (gait to/from therapy) FIM - Locomotion: Ambulation Locomotion: Ambulation Assistive Devices: Orthosis Ambulation/Gait Assistance: 6: Modified independent (Device/Increase time) Locomotion: Ambulation: 6: Travels 150 ft or more independently/takes more than reasonable amount of time  Comprehension Comprehension Mode: Auditory Comprehension: 6-Follows complex conversation/direction: With extra time/assistive device  Expression Expression Mode: Verbal Expression: 6-Expresses complex ideas: With extra time/assistive device  Social Interaction Social Interaction: 6-Interacts appropriately with others with medication or extra time (anti-anxiety, antidepressant).  Problem Solving Problem Solving: 6-Solves complex problems: With extra time  Memory Memory: 6-More than reasonable amt of time  Medical Problem List and Plan:  1. Lumbar spondylolisthesis, stenosis with radiculopathy. Status post redo laminectomy lumbar L3-4 and 4-5 with posterior lumbar interbody fusion 07/04/2012  2. DVT Prophylaxis/Anticoagulation: SCDs. Monitor for any signs of DVT.  3. Pain Management: dc norco. Utilize tylenol, ice. Monitor with increased mobility  4. Neuropsych: This patient is capable of making decisions on his/her own behalf.  5. Postoperative anemia. Continues to slowly improvet. Followup HGB 8.3 today  -no symptoms currently (daughter checked with pcp and baseline hgb around 11.5)  -rec follow up lab next week 6. Diabetes mellitus. Hemoglobin A1c 8.4.   -resume glyburide, resume metformin----increase to bid---no lantus insulin at present '7. Hypertension. Norvasc 10 mg daily, Avapro 150 mg daily. Monitor with  increased mobility 8. Low grade temp:  -urine reculture with enterococcus only 30k but has had frequency and low grade temp  -cipro for 5 days total  LOS (Days) 8 A FACE TO FACE EVALUATION WAS PERFORMED  SWARTZ,ZACHARY T 07/18/2012, 8:09 AM

## 2012-07-18 NOTE — Patient Care Conference (Signed)
Inpatient RehabilitationTeam Conference Note Date: 07/17/2012   Time: 2:10 PM    Patient Name: [name] Mathews      Medical Record Number: [id number]  Date of Birth: 07/16/1934 Sex: Female         Room/Bed: 4147/4147-01 Payor Info: Payor: MEDICARE  Plan: MEDICARE PART A AND B  Product Type: *No Product type*     Admitting Diagnosis: lumbar fusion  Admit Date/Time:  07/10/2012  6:47 PM Admission Comments: No comment available   Primary Diagnosis:  Radiculopathy Principal Problem: Radiculopathy  Patient Active Problem List   Diagnosis Date Noted  . Radiculopathy 07/11/2012  . HTN (hypertension) 07/08/2012  . Diabetes mellitus 07/08/2012  . Encephalopathy acute 07/08/2012  . Tachycardia 07/08/2012  . UTI (lower urinary tract infection) 07/08/2012    Expected Discharge Date: Expected Discharge Date: 07/18/12  Team Members Present: Physician: Dr. Faith Rogue Social Worker Present: Amada Jupiter, LCSW Nurse Present: Chana Bode, RN;Other (comment) Kennon Portela, RN) PT Present: Karolee Stamps, PT OT Present: Mackie Pai, OT;Patricia Mat Carne, OT     Current Status/Progress Goal Weekly Team Focus  Medical   pain controlled, hgb stable, making nice progress  stabilize medically for discharge  see above   Bowel/Bladder   Continent of bowel and bladder. LBM 07/14/2012  Remain continent of bowel and bladder.  Time toileting q 2 hours.   Swallow/Nutrition/ Hydration             ADL's   modified independent  supervision  d/c planning   Mobility   supervision/mod I   supervision/mod I  finalize d/c planning   Communication             Safety/Cognition/ Behavioral Observations            Pain   L3 to L5 incision with dermabond.   3 or less on scale of 0-10.  Monitor for new onset of pain   Skin   Surgical incision on L3-L5 with derrmabond. OTA  Skin free of infection.  Routine turning and monitor of any signs and symptoms of infection.    Rehab Goals Patient on target to  meet rehab goals: Yes *See Interdisciplinary Assessment and Plan and progress notes for long and short-term goals  Barriers to Discharge: none    Possible Resolutions to Barriers:  none    Discharge Planning/Teaching Needs:  Home with daughter and other children sharing in providing 24/7 assistance      Team Discussion:  Excellent progress and ready for d/c 9/18  Revisions to Treatment Plan:  None   Continued Need for Acute Rehabilitation Level of Care: The patient requires daily medical management by a physician with specialized training in physical medicine and rehabilitation for the following conditions: Daily direction of a multidisciplinary physical rehabilitation program to ensure safe treatment while eliciting the highest outcome that is of practical value to the patient.: Yes Daily medical management of patient stability for increased activity during participation in an intensive rehabilitation regime.: Yes Daily analysis of laboratory values and/or radiology reports with any subsequent need for medication adjustment of medical intervention for : Post surgical problems;Other  Veona Bittman 07/18/2012, 10:37 AM

## 2012-07-18 NOTE — Progress Notes (Signed)
Patient discharge to home with husband at 1030.  Discharge instruction given by Harvel Ricks, PA.  Patient verbalize understanding and no question asked.  Patient with husband and her belongings escorted off the unit by rehab NT.

## 2012-07-19 NOTE — Discharge Summary (Signed)
Physician Discharge Summary  Patient ID: [name] Mathews MRN: [id number] DOB/AGE: 76-Apr-1935 76 y.o.  Admit date: 07/04/2012 Discharge date: 07/19/2012 07/10/2012  Admission Diagnoses: L3-4 and L4-5 spondylolisthesis, spinal stenosis, lumbago, lumbar radiculopathy, neurogenic claudication  Discharge Diagnoses: The same Active Problems:  HTN (hypertension)  Diabetes mellitus  Encephalopathy acute  Tachycardia  UTI (lower urinary tract infection)   Discharged Condition: good  Hospital Course: I admitted the patient to [facility] on 07/04/2012. On that day I performed at L3-4 and L4-5 redo laminectomy, fusion, and instrumentation. The surgery were well.  The patient's postoperative course was remarkable for a bout of hyponatremia which resolved. She also had acute blood loss anemia which we treated with by mouth iron. The patient did not require a transfusion. She was treated with Rocephin for a presumed urinary tract infection. She has some confusion which was likely multifactorial and resolved spontaneously. She had some blisters on her wound in the region of the Steri-Strips which was attributed to the adhesive on the Steri-Strips. This all resolved.  By 07/10/2012 the patient was doing well and ready for transfer to the rehabilitation unit. She was transferred.  Consults: Rehabilitation Significant Diagnostic Studies: None Treatments: L3-4 and L4-5 decompression, instrumentation, and fusion. Discharge Exam: Blood pressure 118/43, pulse 89, temperature 98.6 F (37 C), temperature source Oral, resp. rate 18, height 5\' 8"  (1.727 m), weight 101.152 kg (223 lb), SpO2 95.00%. The patient was alert and oriented. Her strength was normal. Her incision was healing well. The blisters were resolving.  Disposition: Inpatient rehabilitation.  Discharge Orders    Future Appointments: Provider: Department: Dept Phone: Center:   07/20/2012 10:30 AM [facility] [address]  [phone] [facility]   07/24/2012 11:00 AM Su Monks, PA-C [facility] [phone] [facility]       Medication List     As of 07/19/2012  9:19 AM    ASK your doctor about these medications         amLODipine 10 MG tablet   Commonly known as: NORVASC   Take 10 mg by mouth daily.      aspirin 81 MG tablet   Take 81 mg by mouth daily.      cetirizine 10 MG tablet   Commonly known as: ZYRTEC   Take 10 mg by mouth daily.      ferrous sulfate 325 (65 FE) MG tablet   Take 325 mg by mouth daily with breakfast.      glyBURIDE-metformin 5-500 MG per tablet   Commonly known as: GLUCOVANCE   Take 1 tablet by mouth 2 (two) times daily with a meal.      Iron 240 (27 FE) MG Tabs   Take 1 tablet by mouth daily.      LORazepam 0.5 MG tablet   Commonly known as: ATIVAN   Take 0.5 mg by mouth daily as needed. For anxiety      rosuvastatin 10 MG tablet   Commonly known as: CRESTOR   Take 5 mg by mouth daily.      telmisartan 40 MG tablet   Commonly known as: MICARDIS   Take 40 mg by mouth daily.      traMADol 50 MG tablet   Commonly known as: ULTRAM   Take 50 mg by mouth every 6 (six) hours as needed.      Vitamin D (Ergocalciferol) 50000 UNITS Caps   Commonly known as: DRISDOL   Take 50,000 Units by mouth every 14 (fourteen) days.  SignedCristi Loron 07/19/2012, 9:19 AM

## 2012-07-20 ENCOUNTER — Ambulatory Visit: Payer: Medicare Other

## 2012-07-20 ENCOUNTER — Ambulatory Visit: Payer: Medicare Other | Attending: Physical Medicine & Rehabilitation

## 2012-07-20 DIAGNOSIS — R5381 Other malaise: Secondary | ICD-10-CM | POA: Insufficient documentation

## 2012-07-20 DIAGNOSIS — IMO0001 Reserved for inherently not codable concepts without codable children: Secondary | ICD-10-CM | POA: Insufficient documentation

## 2012-07-20 DIAGNOSIS — M545 Low back pain, unspecified: Secondary | ICD-10-CM | POA: Insufficient documentation

## 2012-07-20 DIAGNOSIS — M6281 Muscle weakness (generalized): Secondary | ICD-10-CM | POA: Insufficient documentation

## 2012-07-23 ENCOUNTER — Ambulatory Visit: Payer: Medicare Other | Admitting: Rehabilitative and Restorative Service Providers"

## 2012-07-24 ENCOUNTER — Ambulatory Visit: Payer: Medicare Other | Admitting: Physical Therapy

## 2012-07-24 ENCOUNTER — Encounter: Payer: Medicare Other | Attending: Physical Medicine and Rehabilitation | Admitting: *Deleted

## 2012-07-24 VITALS — BP 114/67 | HR 79 | Resp 14 | Ht 66.0 in | Wt 221.2 lb

## 2012-07-24 DIAGNOSIS — D649 Anemia, unspecified: Secondary | ICD-10-CM

## 2012-07-24 LAB — CBC
HCT: 28.3 % — ABNORMAL LOW (ref 36.0–46.0)
Hemoglobin: 8.9 g/dL — ABNORMAL LOW (ref 12.0–15.0)
MCV: 85.8 fL (ref 78.0–100.0)
RBC: 3.3 MIL/uL — ABNORMAL LOW (ref 3.87–5.11)
WBC: 7.6 10*3/uL (ref 4.0–10.5)

## 2012-07-24 NOTE — Progress Notes (Signed)
Patient here this morning to have CBC drawn.  Obtained from right antecubital.

## 2012-07-25 ENCOUNTER — Telehealth: Payer: Self-pay | Admitting: Physical Medicine & Rehabilitation

## 2012-07-25 NOTE — Telephone Encounter (Signed)
Left msg for Ms Lengacher to return my call

## 2012-07-25 NOTE — Telephone Encounter (Signed)
Please contact the patient and let her know her hgb is 8.9  Thank you

## 2012-07-27 NOTE — Telephone Encounter (Signed)
Rene Kocher called to get the lab results of her mother's CBC.  I spoke with her and asked if she was taking iron supplements and she said that Prachi was, but it caused her constipation. I reviewed food sources high in iron that she should eat but that other than a blood transfusion, iron supplements were are the only way to raise hgb quickly. She said she understood and Avigayil is eating the foods we talked about.  She will discuss further with Dr Lovell Sheehan and PCP.  A copy of the lab results was faxed to Dr Lovell Sheehan office at her request.

## 2012-08-01 ENCOUNTER — Ambulatory Visit: Payer: Medicare Other | Attending: Physical Medicine & Rehabilitation | Admitting: Physical Therapy

## 2012-08-01 DIAGNOSIS — M6281 Muscle weakness (generalized): Secondary | ICD-10-CM | POA: Insufficient documentation

## 2012-08-01 DIAGNOSIS — R5381 Other malaise: Secondary | ICD-10-CM | POA: Insufficient documentation

## 2012-08-01 DIAGNOSIS — IMO0001 Reserved for inherently not codable concepts without codable children: Secondary | ICD-10-CM | POA: Insufficient documentation

## 2012-08-01 DIAGNOSIS — M545 Low back pain, unspecified: Secondary | ICD-10-CM | POA: Insufficient documentation

## 2012-08-08 ENCOUNTER — Encounter: Payer: Medicare Other | Admitting: Physical Therapy

## 2012-08-08 ENCOUNTER — Ambulatory Visit: Payer: Medicare Other | Admitting: Rehabilitation

## 2012-08-22 ENCOUNTER — Ambulatory Visit: Payer: Medicare Other | Admitting: Rehabilitative and Restorative Service Providers"

## 2012-08-24 ENCOUNTER — Ambulatory Visit: Payer: Medicare Other | Admitting: Rehabilitation

## 2012-08-29 ENCOUNTER — Ambulatory Visit: Payer: Medicare Other | Admitting: Physical Therapy

## 2012-08-31 ENCOUNTER — Encounter: Payer: Medicare Other | Admitting: Rehabilitation

## 2012-09-05 ENCOUNTER — Ambulatory Visit: Payer: Medicare Other | Attending: Physical Medicine & Rehabilitation | Admitting: Rehabilitation

## 2012-09-05 DIAGNOSIS — IMO0001 Reserved for inherently not codable concepts without codable children: Secondary | ICD-10-CM | POA: Insufficient documentation

## 2012-09-05 DIAGNOSIS — M545 Low back pain, unspecified: Secondary | ICD-10-CM | POA: Insufficient documentation

## 2012-09-05 DIAGNOSIS — R5381 Other malaise: Secondary | ICD-10-CM | POA: Insufficient documentation

## 2012-09-05 DIAGNOSIS — M6281 Muscle weakness (generalized): Secondary | ICD-10-CM | POA: Insufficient documentation

## 2012-09-07 ENCOUNTER — Encounter: Payer: Medicare Other | Admitting: Rehabilitation

## 2012-09-12 ENCOUNTER — Ambulatory Visit: Payer: Medicare Other | Admitting: Rehabilitative and Restorative Service Providers"

## 2016-12-13 DIAGNOSIS — D509 Iron deficiency anemia, unspecified: Secondary | ICD-10-CM | POA: Diagnosis not present

## 2016-12-13 DIAGNOSIS — E113211 Type 2 diabetes mellitus with mild nonproliferative diabetic retinopathy with macular edema, right eye: Secondary | ICD-10-CM | POA: Diagnosis not present

## 2016-12-13 DIAGNOSIS — Z79899 Other long term (current) drug therapy: Secondary | ICD-10-CM | POA: Diagnosis not present

## 2016-12-14 DIAGNOSIS — R55 Syncope and collapse: Secondary | ICD-10-CM | POA: Diagnosis not present

## 2016-12-14 DIAGNOSIS — D649 Anemia, unspecified: Secondary | ICD-10-CM | POA: Diagnosis not present

## 2016-12-14 DIAGNOSIS — N183 Chronic kidney disease, stage 3 (moderate): Secondary | ICD-10-CM | POA: Diagnosis not present

## 2016-12-14 DIAGNOSIS — I10 Essential (primary) hypertension: Secondary | ICD-10-CM | POA: Diagnosis not present

## 2016-12-14 DIAGNOSIS — E1129 Type 2 diabetes mellitus with other diabetic kidney complication: Secondary | ICD-10-CM | POA: Diagnosis not present

## 2016-12-14 DIAGNOSIS — R5383 Other fatigue: Secondary | ICD-10-CM | POA: Diagnosis not present

## 2016-12-14 DIAGNOSIS — Z79899 Other long term (current) drug therapy: Secondary | ICD-10-CM | POA: Diagnosis not present

## 2017-01-05 DIAGNOSIS — M4696 Unspecified inflammatory spondylopathy, lumbar region: Secondary | ICD-10-CM | POA: Diagnosis not present

## 2017-01-05 DIAGNOSIS — F419 Anxiety disorder, unspecified: Secondary | ICD-10-CM | POA: Diagnosis not present

## 2017-01-05 DIAGNOSIS — D649 Anemia, unspecified: Secondary | ICD-10-CM | POA: Diagnosis not present

## 2017-01-05 DIAGNOSIS — E119 Type 2 diabetes mellitus without complications: Secondary | ICD-10-CM | POA: Diagnosis not present

## 2017-01-05 DIAGNOSIS — E1159 Type 2 diabetes mellitus with other circulatory complications: Secondary | ICD-10-CM | POA: Diagnosis not present

## 2017-01-05 DIAGNOSIS — D518 Other vitamin B12 deficiency anemias: Secondary | ICD-10-CM | POA: Diagnosis not present

## 2017-01-12 DIAGNOSIS — G459 Transient cerebral ischemic attack, unspecified: Secondary | ICD-10-CM | POA: Diagnosis not present

## 2017-01-17 DIAGNOSIS — G459 Transient cerebral ischemic attack, unspecified: Secondary | ICD-10-CM | POA: Diagnosis not present

## 2017-01-17 DIAGNOSIS — I6782 Cerebral ischemia: Secondary | ICD-10-CM | POA: Diagnosis not present

## 2017-01-26 DIAGNOSIS — E113211 Type 2 diabetes mellitus with mild nonproliferative diabetic retinopathy with macular edema, right eye: Secondary | ICD-10-CM | POA: Diagnosis not present

## 2017-01-28 DIAGNOSIS — M810 Age-related osteoporosis without current pathological fracture: Secondary | ICD-10-CM | POA: Diagnosis not present

## 2017-01-28 DIAGNOSIS — E113211 Type 2 diabetes mellitus with mild nonproliferative diabetic retinopathy with macular edema, right eye: Secondary | ICD-10-CM | POA: Diagnosis not present

## 2017-01-28 DIAGNOSIS — I10 Essential (primary) hypertension: Secondary | ICD-10-CM | POA: Diagnosis not present

## 2017-01-28 DIAGNOSIS — Z794 Long term (current) use of insulin: Secondary | ICD-10-CM | POA: Diagnosis not present

## 2017-01-28 DIAGNOSIS — N183 Chronic kidney disease, stage 3 (moderate): Secondary | ICD-10-CM | POA: Diagnosis not present

## 2017-02-20 DIAGNOSIS — Z79899 Other long term (current) drug therapy: Secondary | ICD-10-CM | POA: Diagnosis not present

## 2017-02-20 DIAGNOSIS — Z0000 Encounter for general adult medical examination without abnormal findings: Secondary | ICD-10-CM | POA: Diagnosis not present

## 2017-02-20 DIAGNOSIS — E119 Type 2 diabetes mellitus without complications: Secondary | ICD-10-CM | POA: Diagnosis not present

## 2017-02-20 DIAGNOSIS — E785 Hyperlipidemia, unspecified: Secondary | ICD-10-CM | POA: Diagnosis not present

## 2017-02-20 DIAGNOSIS — D649 Anemia, unspecified: Secondary | ICD-10-CM | POA: Diagnosis not present

## 2017-02-20 DIAGNOSIS — R5383 Other fatigue: Secondary | ICD-10-CM | POA: Diagnosis not present

## 2017-02-20 DIAGNOSIS — Z1389 Encounter for screening for other disorder: Secondary | ICD-10-CM | POA: Diagnosis not present

## 2017-02-20 DIAGNOSIS — E559 Vitamin D deficiency, unspecified: Secondary | ICD-10-CM | POA: Diagnosis not present

## 2017-02-23 DIAGNOSIS — M545 Low back pain: Secondary | ICD-10-CM | POA: Diagnosis not present

## 2017-02-23 DIAGNOSIS — M4712 Other spondylosis with myelopathy, cervical region: Secondary | ICD-10-CM | POA: Diagnosis not present

## 2017-02-23 DIAGNOSIS — R030 Elevated blood-pressure reading, without diagnosis of hypertension: Secondary | ICD-10-CM | POA: Diagnosis not present

## 2017-02-23 DIAGNOSIS — Z6835 Body mass index (BMI) 35.0-35.9, adult: Secondary | ICD-10-CM | POA: Diagnosis not present

## 2017-02-27 DIAGNOSIS — N183 Chronic kidney disease, stage 3 (moderate): Secondary | ICD-10-CM | POA: Diagnosis not present

## 2017-02-27 DIAGNOSIS — M810 Age-related osteoporosis without current pathological fracture: Secondary | ICD-10-CM | POA: Diagnosis not present

## 2017-02-27 DIAGNOSIS — E113211 Type 2 diabetes mellitus with mild nonproliferative diabetic retinopathy with macular edema, right eye: Secondary | ICD-10-CM | POA: Diagnosis not present

## 2017-02-27 DIAGNOSIS — E785 Hyperlipidemia, unspecified: Secondary | ICD-10-CM | POA: Diagnosis not present

## 2017-02-27 DIAGNOSIS — Z794 Long term (current) use of insulin: Secondary | ICD-10-CM | POA: Diagnosis not present

## 2017-03-01 DIAGNOSIS — M6281 Muscle weakness (generalized): Secondary | ICD-10-CM | POA: Diagnosis not present

## 2017-03-01 DIAGNOSIS — R262 Difficulty in walking, not elsewhere classified: Secondary | ICD-10-CM | POA: Diagnosis not present

## 2017-03-01 DIAGNOSIS — M545 Low back pain: Secondary | ICD-10-CM | POA: Diagnosis not present

## 2017-03-01 DIAGNOSIS — E11649 Type 2 diabetes mellitus with hypoglycemia without coma: Secondary | ICD-10-CM | POA: Diagnosis not present

## 2017-03-01 DIAGNOSIS — M2560 Stiffness of unspecified joint, not elsewhere classified: Secondary | ICD-10-CM | POA: Diagnosis not present

## 2017-03-01 DIAGNOSIS — M25612 Stiffness of left shoulder, not elsewhere classified: Secondary | ICD-10-CM | POA: Diagnosis not present

## 2017-03-01 DIAGNOSIS — M25611 Stiffness of right shoulder, not elsewhere classified: Secondary | ICD-10-CM | POA: Diagnosis not present

## 2017-03-03 DIAGNOSIS — M25611 Stiffness of right shoulder, not elsewhere classified: Secondary | ICD-10-CM | POA: Diagnosis not present

## 2017-03-03 DIAGNOSIS — R262 Difficulty in walking, not elsewhere classified: Secondary | ICD-10-CM | POA: Diagnosis not present

## 2017-03-03 DIAGNOSIS — E11649 Type 2 diabetes mellitus with hypoglycemia without coma: Secondary | ICD-10-CM | POA: Diagnosis not present

## 2017-03-03 DIAGNOSIS — M25612 Stiffness of left shoulder, not elsewhere classified: Secondary | ICD-10-CM | POA: Diagnosis not present

## 2017-03-03 DIAGNOSIS — M2560 Stiffness of unspecified joint, not elsewhere classified: Secondary | ICD-10-CM | POA: Diagnosis not present

## 2017-03-03 DIAGNOSIS — M545 Low back pain: Secondary | ICD-10-CM | POA: Diagnosis not present

## 2017-03-03 DIAGNOSIS — M6281 Muscle weakness (generalized): Secondary | ICD-10-CM | POA: Diagnosis not present

## 2017-03-07 DIAGNOSIS — M2560 Stiffness of unspecified joint, not elsewhere classified: Secondary | ICD-10-CM | POA: Diagnosis not present

## 2017-03-07 DIAGNOSIS — M25612 Stiffness of left shoulder, not elsewhere classified: Secondary | ICD-10-CM | POA: Diagnosis not present

## 2017-03-07 DIAGNOSIS — M6281 Muscle weakness (generalized): Secondary | ICD-10-CM | POA: Diagnosis not present

## 2017-03-07 DIAGNOSIS — E11649 Type 2 diabetes mellitus with hypoglycemia without coma: Secondary | ICD-10-CM | POA: Diagnosis not present

## 2017-03-07 DIAGNOSIS — R262 Difficulty in walking, not elsewhere classified: Secondary | ICD-10-CM | POA: Diagnosis not present

## 2017-03-07 DIAGNOSIS — M25611 Stiffness of right shoulder, not elsewhere classified: Secondary | ICD-10-CM | POA: Diagnosis not present

## 2017-03-07 DIAGNOSIS — M545 Low back pain: Secondary | ICD-10-CM | POA: Diagnosis not present

## 2017-03-09 DIAGNOSIS — M25611 Stiffness of right shoulder, not elsewhere classified: Secondary | ICD-10-CM | POA: Diagnosis not present

## 2017-03-09 DIAGNOSIS — R262 Difficulty in walking, not elsewhere classified: Secondary | ICD-10-CM | POA: Diagnosis not present

## 2017-03-09 DIAGNOSIS — M25612 Stiffness of left shoulder, not elsewhere classified: Secondary | ICD-10-CM | POA: Diagnosis not present

## 2017-03-09 DIAGNOSIS — M6281 Muscle weakness (generalized): Secondary | ICD-10-CM | POA: Diagnosis not present

## 2017-03-09 DIAGNOSIS — E11649 Type 2 diabetes mellitus with hypoglycemia without coma: Secondary | ICD-10-CM | POA: Diagnosis not present

## 2017-03-09 DIAGNOSIS — M545 Low back pain: Secondary | ICD-10-CM | POA: Diagnosis not present

## 2017-03-09 DIAGNOSIS — M2560 Stiffness of unspecified joint, not elsewhere classified: Secondary | ICD-10-CM | POA: Diagnosis not present

## 2017-03-10 DIAGNOSIS — H43822 Vitreomacular adhesion, left eye: Secondary | ICD-10-CM | POA: Diagnosis not present

## 2017-03-10 DIAGNOSIS — H2513 Age-related nuclear cataract, bilateral: Secondary | ICD-10-CM | POA: Diagnosis not present

## 2017-03-10 DIAGNOSIS — E113292 Type 2 diabetes mellitus with mild nonproliferative diabetic retinopathy without macular edema, left eye: Secondary | ICD-10-CM | POA: Diagnosis not present

## 2017-03-10 DIAGNOSIS — H4321 Crystalline deposits in vitreous body, right eye: Secondary | ICD-10-CM | POA: Diagnosis not present

## 2017-03-10 DIAGNOSIS — E113211 Type 2 diabetes mellitus with mild nonproliferative diabetic retinopathy with macular edema, right eye: Secondary | ICD-10-CM | POA: Diagnosis not present

## 2017-03-17 DIAGNOSIS — M25612 Stiffness of left shoulder, not elsewhere classified: Secondary | ICD-10-CM | POA: Diagnosis not present

## 2017-03-17 DIAGNOSIS — M545 Low back pain: Secondary | ICD-10-CM | POA: Diagnosis not present

## 2017-03-17 DIAGNOSIS — R262 Difficulty in walking, not elsewhere classified: Secondary | ICD-10-CM | POA: Diagnosis not present

## 2017-03-17 DIAGNOSIS — M6281 Muscle weakness (generalized): Secondary | ICD-10-CM | POA: Diagnosis not present

## 2017-03-17 DIAGNOSIS — M25611 Stiffness of right shoulder, not elsewhere classified: Secondary | ICD-10-CM | POA: Diagnosis not present

## 2017-03-17 DIAGNOSIS — M2560 Stiffness of unspecified joint, not elsewhere classified: Secondary | ICD-10-CM | POA: Diagnosis not present

## 2017-03-17 DIAGNOSIS — E11649 Type 2 diabetes mellitus with hypoglycemia without coma: Secondary | ICD-10-CM | POA: Diagnosis not present

## 2017-03-21 DIAGNOSIS — M6281 Muscle weakness (generalized): Secondary | ICD-10-CM | POA: Diagnosis not present

## 2017-03-21 DIAGNOSIS — M25611 Stiffness of right shoulder, not elsewhere classified: Secondary | ICD-10-CM | POA: Diagnosis not present

## 2017-03-21 DIAGNOSIS — M545 Low back pain: Secondary | ICD-10-CM | POA: Diagnosis not present

## 2017-03-21 DIAGNOSIS — R262 Difficulty in walking, not elsewhere classified: Secondary | ICD-10-CM | POA: Diagnosis not present

## 2017-03-21 DIAGNOSIS — M2560 Stiffness of unspecified joint, not elsewhere classified: Secondary | ICD-10-CM | POA: Diagnosis not present

## 2017-03-21 DIAGNOSIS — E11649 Type 2 diabetes mellitus with hypoglycemia without coma: Secondary | ICD-10-CM | POA: Diagnosis not present

## 2017-03-21 DIAGNOSIS — M25612 Stiffness of left shoulder, not elsewhere classified: Secondary | ICD-10-CM | POA: Diagnosis not present

## 2017-03-23 DIAGNOSIS — M545 Low back pain: Secondary | ICD-10-CM | POA: Diagnosis not present

## 2017-03-23 DIAGNOSIS — M25611 Stiffness of right shoulder, not elsewhere classified: Secondary | ICD-10-CM | POA: Diagnosis not present

## 2017-03-23 DIAGNOSIS — R262 Difficulty in walking, not elsewhere classified: Secondary | ICD-10-CM | POA: Diagnosis not present

## 2017-03-23 DIAGNOSIS — M2560 Stiffness of unspecified joint, not elsewhere classified: Secondary | ICD-10-CM | POA: Diagnosis not present

## 2017-03-23 DIAGNOSIS — M6281 Muscle weakness (generalized): Secondary | ICD-10-CM | POA: Diagnosis not present

## 2017-03-23 DIAGNOSIS — E11649 Type 2 diabetes mellitus with hypoglycemia without coma: Secondary | ICD-10-CM | POA: Diagnosis not present

## 2017-03-23 DIAGNOSIS — M25612 Stiffness of left shoulder, not elsewhere classified: Secondary | ICD-10-CM | POA: Diagnosis not present

## 2017-03-28 DIAGNOSIS — M25611 Stiffness of right shoulder, not elsewhere classified: Secondary | ICD-10-CM | POA: Diagnosis not present

## 2017-03-28 DIAGNOSIS — M25612 Stiffness of left shoulder, not elsewhere classified: Secondary | ICD-10-CM | POA: Diagnosis not present

## 2017-03-28 DIAGNOSIS — M6281 Muscle weakness (generalized): Secondary | ICD-10-CM | POA: Diagnosis not present

## 2017-03-28 DIAGNOSIS — R262 Difficulty in walking, not elsewhere classified: Secondary | ICD-10-CM | POA: Diagnosis not present

## 2017-03-28 DIAGNOSIS — M2560 Stiffness of unspecified joint, not elsewhere classified: Secondary | ICD-10-CM | POA: Diagnosis not present

## 2017-03-28 DIAGNOSIS — E11649 Type 2 diabetes mellitus with hypoglycemia without coma: Secondary | ICD-10-CM | POA: Diagnosis not present

## 2017-03-28 DIAGNOSIS — M545 Low back pain: Secondary | ICD-10-CM | POA: Diagnosis not present

## 2017-03-30 DIAGNOSIS — I10 Essential (primary) hypertension: Secondary | ICD-10-CM | POA: Diagnosis not present

## 2017-03-30 DIAGNOSIS — E1159 Type 2 diabetes mellitus with other circulatory complications: Secondary | ICD-10-CM | POA: Diagnosis not present

## 2017-03-30 DIAGNOSIS — M6281 Muscle weakness (generalized): Secondary | ICD-10-CM | POA: Diagnosis not present

## 2017-03-30 DIAGNOSIS — N183 Chronic kidney disease, stage 3 (moderate): Secondary | ICD-10-CM | POA: Diagnosis not present

## 2017-03-30 DIAGNOSIS — R262 Difficulty in walking, not elsewhere classified: Secondary | ICD-10-CM | POA: Diagnosis not present

## 2017-03-30 DIAGNOSIS — M4696 Unspecified inflammatory spondylopathy, lumbar region: Secondary | ICD-10-CM | POA: Diagnosis not present

## 2017-03-30 DIAGNOSIS — M545 Low back pain: Secondary | ICD-10-CM | POA: Diagnosis not present

## 2017-03-30 DIAGNOSIS — M2560 Stiffness of unspecified joint, not elsewhere classified: Secondary | ICD-10-CM | POA: Diagnosis not present

## 2017-03-30 DIAGNOSIS — M25611 Stiffness of right shoulder, not elsewhere classified: Secondary | ICD-10-CM | POA: Diagnosis not present

## 2017-03-30 DIAGNOSIS — E11649 Type 2 diabetes mellitus with hypoglycemia without coma: Secondary | ICD-10-CM | POA: Diagnosis not present

## 2017-03-30 DIAGNOSIS — M25612 Stiffness of left shoulder, not elsewhere classified: Secondary | ICD-10-CM | POA: Diagnosis not present

## 2017-04-04 DIAGNOSIS — M6281 Muscle weakness (generalized): Secondary | ICD-10-CM | POA: Diagnosis not present

## 2017-04-04 DIAGNOSIS — M25612 Stiffness of left shoulder, not elsewhere classified: Secondary | ICD-10-CM | POA: Diagnosis not present

## 2017-04-04 DIAGNOSIS — M25611 Stiffness of right shoulder, not elsewhere classified: Secondary | ICD-10-CM | POA: Diagnosis not present

## 2017-04-04 DIAGNOSIS — M545 Low back pain: Secondary | ICD-10-CM | POA: Diagnosis not present

## 2017-04-04 DIAGNOSIS — E11649 Type 2 diabetes mellitus with hypoglycemia without coma: Secondary | ICD-10-CM | POA: Diagnosis not present

## 2017-04-04 DIAGNOSIS — R262 Difficulty in walking, not elsewhere classified: Secondary | ICD-10-CM | POA: Diagnosis not present

## 2017-04-04 DIAGNOSIS — M2560 Stiffness of unspecified joint, not elsewhere classified: Secondary | ICD-10-CM | POA: Diagnosis not present

## 2017-04-07 DIAGNOSIS — E113211 Type 2 diabetes mellitus with mild nonproliferative diabetic retinopathy with macular edema, right eye: Secondary | ICD-10-CM | POA: Diagnosis not present

## 2017-04-29 DIAGNOSIS — I10 Essential (primary) hypertension: Secondary | ICD-10-CM | POA: Diagnosis not present

## 2017-04-29 DIAGNOSIS — M810 Age-related osteoporosis without current pathological fracture: Secondary | ICD-10-CM | POA: Diagnosis not present

## 2017-04-29 DIAGNOSIS — E113211 Type 2 diabetes mellitus with mild nonproliferative diabetic retinopathy with macular edema, right eye: Secondary | ICD-10-CM | POA: Diagnosis not present

## 2017-04-29 DIAGNOSIS — N183 Chronic kidney disease, stage 3 (moderate): Secondary | ICD-10-CM | POA: Diagnosis not present

## 2017-04-29 DIAGNOSIS — Z794 Long term (current) use of insulin: Secondary | ICD-10-CM | POA: Diagnosis not present

## 2017-05-22 ENCOUNTER — Encounter: Payer: Self-pay | Admitting: Oncology

## 2017-05-22 ENCOUNTER — Ambulatory Visit (INDEPENDENT_AMBULATORY_CARE_PROVIDER_SITE_OTHER): Payer: Medicare Other | Admitting: Oncology

## 2017-05-22 DIAGNOSIS — N183 Chronic kidney disease, stage 3 (moderate): Secondary | ICD-10-CM | POA: Diagnosis not present

## 2017-05-22 DIAGNOSIS — Z888 Allergy status to other drugs, medicaments and biological substances status: Secondary | ICD-10-CM

## 2017-05-22 DIAGNOSIS — J302 Other seasonal allergic rhinitis: Secondary | ICD-10-CM | POA: Diagnosis not present

## 2017-05-22 DIAGNOSIS — D649 Anemia, unspecified: Secondary | ICD-10-CM | POA: Diagnosis not present

## 2017-05-22 DIAGNOSIS — Z7982 Long term (current) use of aspirin: Secondary | ICD-10-CM | POA: Diagnosis not present

## 2017-05-22 DIAGNOSIS — Z7984 Long term (current) use of oral hypoglycemic drugs: Secondary | ICD-10-CM

## 2017-05-22 DIAGNOSIS — R768 Other specified abnormal immunological findings in serum: Secondary | ICD-10-CM

## 2017-05-22 DIAGNOSIS — Z79899 Other long term (current) drug therapy: Secondary | ICD-10-CM

## 2017-05-22 DIAGNOSIS — E663 Overweight: Secondary | ICD-10-CM | POA: Diagnosis not present

## 2017-05-22 DIAGNOSIS — E1122 Type 2 diabetes mellitus with diabetic chronic kidney disease: Secondary | ICD-10-CM

## 2017-05-22 HISTORY — DX: Anemia, unspecified: D64.9

## 2017-05-22 LAB — CBC WITH DIFFERENTIAL/PLATELET
BASOS PCT: 1 %
Basophils Absolute: 0 10*3/uL (ref 0.0–0.1)
Eosinophils Absolute: 0.2 10*3/uL (ref 0.0–0.7)
Eosinophils Relative: 3 %
HEMATOCRIT: 33.8 % — AB (ref 36.0–46.0)
Hemoglobin: 10.7 g/dL — ABNORMAL LOW (ref 12.0–15.0)
LYMPHS ABS: 2.5 10*3/uL (ref 0.7–4.0)
Lymphocytes Relative: 41 %
MCH: 27.5 pg (ref 26.0–34.0)
MCHC: 31.7 g/dL (ref 30.0–36.0)
MCV: 86.9 fL (ref 78.0–100.0)
MONO ABS: 0.4 10*3/uL (ref 0.1–1.0)
MONOS PCT: 6 %
Neutro Abs: 3 10*3/uL (ref 1.7–7.7)
Neutrophils Relative %: 49 %
Platelets: 199 10*3/uL (ref 150–400)
RBC: 3.89 MIL/uL (ref 3.87–5.11)
RDW: 15 % (ref 11.5–15.5)
WBC: 6.1 10*3/uL (ref 4.0–10.5)

## 2017-05-22 LAB — RETICULOCYTES
RBC.: 3.89 MIL/uL (ref 3.87–5.11)
Retic Count, Absolute: 38.9 10*3/uL (ref 19.0–186.0)
Retic Ct Pct: 1 % (ref 0.4–3.1)

## 2017-05-22 LAB — SAVE SMEAR

## 2017-05-22 NOTE — Progress Notes (Signed)
New Patient Hematology   [name] Mathews [id number] 07-07-1934 83 y.o. 05/22/2017  CC:  Dr Jarrett Ables [facility] internal medicine [name], [name] [address]     Reason for referral: Evaluate polyclonal gammopathy   HPI:  Pleasant 83 year old woman with long-standing type 2 diabetes controlled on oral agents. She used to live in [place]. Currently living in new [place] with one of her daughters. We have lab data dating back to September 2013 and then a gap until recent lab provided by her referring internist including lab from September 2016 through April 2018. She has been chronically anemic with hemoglobin averaging 10 g plus or minus 0.5 but unchanged over time. MCV normal. She had a posterior lumbar fusion surgery in September 2013. Preop hemoglobin 11.7, fell to 7.4. She received a blood transfusion at that time. Most recent hemoglobin recorded as an outpatient on 02/20/2017 was 10.2 with hematocrit 31.8, MCV 85.6, white count 5200 with 54 neutrophils, 38 lymphocytes, platelet count 167,000. She has chronic mild renal insufficiency with creatinine in the 1.3-1.6 range. Lab also done on April 23 with normal serum total protein 7.1, albumin 3.8, calcium 8.9. Serum protein electrophoresis was done and showed no M spike. Quantitative immunoglobulins showed a minimal elevation of IgA,, 425 mg percent, normal range 64-422. IgG normal at 1063, IgM normal at 60. Immunofixation showed "an apparent" IgA polyclonal gammopathy. The patient admits to seasonal allergies particularly in the spring with symptoms of rhinitis. No medication allergies.  Family is particularly concerned with the decrease in their mom's energy and activity level which they date to November 2017 and she was having recurrent hypoglycemic episodes. Ambulance had to be called to her house on at least 2 occasions. During one episode she had transient right facial weakness, urinary incontinence, and  immobility and was found to have a glucose of 36. She did not require hospitalization. She used to be much more active at her church playing the piano and socializing. Although she still attends services she has not been as active which is also a concern to her family. They say it is difficult to get her mobilized and she would prefer to stay in bed most the day if it was up to her. She says that she is just lazy and at 83 years old "I want time for me". She denies any feelings of depression. Her appetite is good. Her weight has been stable. There is no known history of thyroid disease. She denies any acute or chronic cardiorespiratory symptoms, no change in bowel habit, no hematochezia or melena, no hematuria, no vaginal bleeding. She states she is up-to-date on her health maintenance including pelvic exams, Pap smears, and mammograms. In fact she is scheduled for a mammogram later this week here in [place].  Lab in our office today is at her chronic baseline with hemoglobin 10.7, hematocrit 34, MCV 86.9, white count 6100, 49 neutrophils, 41 lymphocytes, 6 monocytes, 3 eosinophils, platelet count 199,000.  PMH: Past Medical History:  Diagnosis Date  . Anemia    hx of  . Anxiety   . Arthritis   . Breast lump    hx of  . Cataract    left eye  . Chronic back pain   . Diabetes mellitus   . Hyperlipidemia   . Hypertension    sees Dr. Jarrett Ables, [facility] internal medicine  . Normochromic normocytic anemia 05/22/2017   Hb 10 MCV 85.6 No monoclonal spike on SPEP  Creatinine  1.3-1.6  . Peripheral vascular disease ([place])   Blood transfusion at time of back surgery in September 2013. No history of MI, ulcers, thyroid disease, hepatitis, yellow jaundice, mononucleosis, seizure, stroke. Some transient neurologic changes with right-sided facial weakness and urinary incontinence at time of a episode of severe hypoglycemia in November 2017.  Past Surgical History:  Procedure Laterality Date  .  APPENDECTOMY    . BACK SURGERY    . BREAST SURGERY     cyst removed left side  . CARDIOVASCULAR STRESS TEST    . COLONOSCOPY    . DILATION AND CURETTAGE OF UTERUS    . TONSILLECTOMY      Allergies: Allergies  Allergen Reactions  . Januvia [Sitagliptin] Other (See Comments)    Numbness in mouth  . Simvastatin Other (See Comments)    tremors    Medications:  Current Outpatient Prescriptions:  .  metFORMIN (GLUCOPHAGE) 500 MG tablet, Take 500 mg by mouth 2 (two) times daily with a meal., Disp: , Rfl:  .  tolterodine (DETROL) 2 MG tablet, Take 2 mg by mouth daily., Disp: , Rfl:  .  vitamin B-12 (CYANOCOBALAMIN) 1000 MCG tablet, Take 1,000 mcg by mouth daily. OTC, Disp: , Rfl:  .  amLODipine (NORVASC) 10 MG tablet, Take 10 mg by mouth daily., Disp: , Rfl:  .  aspirin 81 MG tablet, Take 81 mg by mouth daily., Disp: , Rfl:  .  cetirizine (ZYRTEC) 10 MG tablet, Take 10 mg by mouth daily., Disp: , Rfl:  .  ciprofloxacin (CIPRO) 250 MG tablet, Take 1 tablet (250 mg total) by mouth 2 (two) times daily. (Patient not taking: Reported on 05/22/2017), Disp: 8 tablet, Rfl: 0 .  Ferrous Gluconate (IRON) 240 (27 FE) MG TABS, Take 1 tablet by mouth daily., Disp: , Rfl:  .  glyBURIDE-metformin (GLUCOVANCE) 5-500 MG per tablet, Take 1 tablet by mouth 2 (two) times daily with a meal., Disp: , Rfl:  .  LORazepam (ATIVAN) 0.5 MG tablet, Take 0.5 mg by mouth daily as needed. For anxiety, Disp: , Rfl:  .  methocarbamol (ROBAXIN) 500 MG tablet, Take 1 tablet (500 mg total) by mouth every 8 (eight) hours as needed. (Patient not taking: Reported on 05/22/2017), Disp: 60 tablet, Rfl: 0 .  pantoprazole (PROTONIX) 40 MG tablet, Take 1 tablet (40 mg total) by mouth daily before breakfast. (Patient not taking: Reported on 05/22/2017), Disp: 30 tablet, Rfl: 1 .  rosuvastatin (CRESTOR) 10 MG tablet, Take 5 mg by mouth daily., Disp: , Rfl:  .  senna-docusate (SENOKOT-S) 8.6-50 MG per tablet, Take 2 tablets by mouth at  bedtime. (Patient not taking: Reported on 05/22/2017), Disp: , Rfl:  .  telmisartan (MICARDIS) 40 MG tablet, Take 40 mg by mouth daily., Disp: , Rfl:  .  Vitamin D, Ergocalciferol, (DRISDOL) 50000 UNITS CAPS, Take 50,000 Units by mouth every 14 (fourteen) days., Disp: , Rfl:   Social History: Widowed 22 years. She did not work outside the home. She plays the piano. She lives with one of her daughters and [place]. 2 of her daughters accompanied her today. She also has 2 sons.   She has never smoked. She has never used smokeless tobacco. She does not drink alcohol or use drugs.  Family History: She has 2 sisters and a brother who are younger than her and in overall good health. .  Review of Systems: See history of present illness. Recently put on Detrol to control intermittent urinary incontinence but not reacting  well to the drug. Decreased urine output. Decreased by mouth intake as reported by her daughters. They elected to stop the drug and already both her by mouth intake and urine output have improved. Remaining ROS negative.  Physical Exam: Blood pressure (!) 143/63, pulse 94, temperature 98.1 F (36.7 C), temperature source Oral, height _0  (1.676 m), weight 227 lb 6.4 oz (103.1 kg), SpO2 100 %. Wt Readings from Last 3 Encounters:  05/22/17 227 lb 6.4 oz (103.1 kg)  07/24/12 221 lb 3.2 oz (100.3 kg)  07/11/12 244 lb 14.9 oz (111.1 kg)     General appearance: Pleasant overweight African-American woman wearing an attractive wig HENNT: Pharynx no erythema, exudate, mass, or ulcer. No thyromegaly or thyroid nodules Lymph nodes: No cervical, supraclavicular, or axillary lymphadenopathy Breasts:  Lungs: Clear to auscultation, resonant to percussion throughout Heart: Regular rhythm, no murmur, no gallop, no rub, no click, no edema Abdomen: Soft, obese, nontender, normal bowel sounds, no mass, no organomegaly Extremities: No edema, no calf tenderness Musculoskeletal: no joint  deformities GU:  Vascular: Carotid pulses 2+, no bruits,  Neurologic: Alert, oriented, PERRLA, optic disc sharp and vessels normal on the right, not well visualized on the left, no hemorrhage or exudate, cranial nerves grossly normal, motor strength 5 over 5, reflexes 1+ symmetric, upper body coordination normal, gait normal, minimal decreased vibration sensation by tuning fork exam over the fingertips Skin: No rash or ecchymosis    Lab Results: Lab Results  Component Value Date   WBC 6.1 05/22/2017   HGB 10.7 (L) 05/22/2017   HCT 33.8 (L) 05/22/2017   MCV 86.9 05/22/2017   PLT 199 05/22/2017     Chemistry      Component Value Date/Time   NA 136 07/11/2012 0600   K 3.7 07/11/2012 0600   CL 101 07/11/2012 0600   CO2 25 07/11/2012 0600   BUN 13 07/11/2012 0600   CREATININE 1.01 07/11/2012 0600      Component Value Date/Time   CALCIUM 8.7 07/11/2012 0600   ALKPHOS 99 07/11/2012 0600   AST 20 07/11/2012 0600   ALT 13 07/11/2012 0600   BILITOT 0.3 07/11/2012 0600       Review of peripheral blood film: Normochromic normocytic red cells, no spherocytes, schistocytes, polychromasia, or inclusions.  Mature neutrophils, monocytes, and lymphocytes.  Platelets normal in number and morphology.      Impression: #1. Minimal polyclonal elevation of IgA.  This is not pathologic. Likely reflects her chronic allergies.  IgA secreted from mucosal surfaces of the upper respiratory and GI tracts. There is nothing to suggest early multiple myeloma. In fact, I would not have been surprised if she did have some monoclonal protein in her serum or urine since over 5% of people in her age range do. She has a chronic, stable, normochromic anemia. No constitutional symptoms. Chronic moderate renal insufficiency which together with long-standing diabetes may be responsible for her anemia. A myelodysplastic syndrome would be in the differential however stability of her anemia over time and the  absence of other cytopenias argues against this. I told her and her family I did not think it was necessary to do a bone marrow biopsy. Even in the worst-case scenario if she had a myelodysplastic syndrome, with a hemoglobin above 10 g, observation alone would be the treatment of choice. Although she denies depression, I wonder if some of her symptoms could be signs of depression? Another remote consideration given the change in her global performance status and intermittent  incontinence would be normal pressure hydrocephalus. She has no focal neurologic deficits on my exam today.     Recommendation: I am going to repeat the immunofixation electrophoresis today and also assess kappa and lambda free light chains. I do not expect that results will change my impression above.      Murriel Hopper, MD, [place]  Hematology-Oncology/Internal Medicine  05/22/2017, 3:54 PM  Addendum: May 24, 2017 Repeat immunoglobulins show persistent minimal elevation of IgA and a polyclonal pattern. Total IgA 440 mg percent normal up to 422.  Normal IgG and IgM. Serum free kappa light chains mildly elevated but normal kappa/lambda ratio of 1.63 (0.26-1.65) No evidence for a hemolytic process with LDH normal at 190 and reticulocyte count 1%. Impression: No evidence for malignant hematopathology.  Mild, chronic, normochromic anemia likely related to stage III chronic renal insufficiency with estimated GFR 47 mL/min. We will call the patient with results.  Murriel Hopper, MD, [place]  Hematology-Oncology/Internal Medicine

## 2017-05-22 NOTE — Patient Instructions (Signed)
To lab today We will call with results Return visit only as needed

## 2017-05-23 LAB — BASIC METABOLIC PANEL
BUN/Creatinine Ratio: 14 (ref 12–28)
BUN: 18 mg/dL (ref 8–27)
CHLORIDE: 101 mmol/L (ref 96–106)
CO2: 25 mmol/L (ref 20–29)
CREATININE: 1.25 mg/dL — AB (ref 0.57–1.00)
Calcium: 9.4 mg/dL (ref 8.7–10.3)
GFR calc Af Amer: 47 mL/min/{1.73_m2} — ABNORMAL LOW (ref >59)
GFR calc non Af Amer: 40 mL/min/{1.73_m2} — ABNORMAL LOW (ref >59)
Glucose: 203 mg/dL — ABNORMAL HIGH (ref 65–99)
POTASSIUM: 4.5 mmol/L (ref 3.5–5.2)
SODIUM: 142 mmol/L (ref 134–144)

## 2017-05-23 LAB — IMMUNOFIXATION ELECTROPHORESIS
IGG (IMMUNOGLOBIN G), SERUM: 1072 mg/dL (ref 700–1600)
IgA/Immunoglobulin A, Serum: 440 mg/dL — ABNORMAL HIGH (ref 64–422)
IgM (Immunoglobulin M), Srm: 62 mg/dL (ref 26–217)
Total Protein: 7.2 g/dL (ref 6.0–8.5)

## 2017-05-23 LAB — KAPPA/LAMBDA LIGHT CHAINS
IG LAMBDA FREE LIGHT CHAIN: 14.5 mg/L (ref 5.7–26.3)
Ig Kappa Free Light Chain: 23.7 mg/L — ABNORMAL HIGH (ref 3.3–19.4)
Kappa/Lambda FluidC Ratio: 1.63 (ref 0.26–1.65)

## 2017-05-23 LAB — LACTATE DEHYDROGENASE: LDH: 190 IU/L (ref 119–226)

## 2017-05-24 ENCOUNTER — Other Ambulatory Visit: Payer: Self-pay | Admitting: Oncology

## 2017-05-30 DIAGNOSIS — Z794 Long term (current) use of insulin: Secondary | ICD-10-CM | POA: Diagnosis not present

## 2017-05-30 DIAGNOSIS — N183 Chronic kidney disease, stage 3 (moderate): Secondary | ICD-10-CM | POA: Diagnosis not present

## 2017-05-30 DIAGNOSIS — M810 Age-related osteoporosis without current pathological fracture: Secondary | ICD-10-CM | POA: Diagnosis not present

## 2017-05-30 DIAGNOSIS — E113211 Type 2 diabetes mellitus with mild nonproliferative diabetic retinopathy with macular edema, right eye: Secondary | ICD-10-CM | POA: Diagnosis not present

## 2017-06-02 DIAGNOSIS — Z1231 Encounter for screening mammogram for malignant neoplasm of breast: Secondary | ICD-10-CM | POA: Diagnosis not present

## 2017-06-06 DIAGNOSIS — M6281 Muscle weakness (generalized): Secondary | ICD-10-CM | POA: Diagnosis not present

## 2017-06-06 DIAGNOSIS — E11649 Type 2 diabetes mellitus with hypoglycemia without coma: Secondary | ICD-10-CM | POA: Diagnosis not present

## 2017-06-06 DIAGNOSIS — M2560 Stiffness of unspecified joint, not elsewhere classified: Secondary | ICD-10-CM | POA: Diagnosis not present

## 2017-06-06 DIAGNOSIS — M25611 Stiffness of right shoulder, not elsewhere classified: Secondary | ICD-10-CM | POA: Diagnosis not present

## 2017-06-06 DIAGNOSIS — R262 Difficulty in walking, not elsewhere classified: Secondary | ICD-10-CM | POA: Diagnosis not present

## 2017-06-06 DIAGNOSIS — M25612 Stiffness of left shoulder, not elsewhere classified: Secondary | ICD-10-CM | POA: Diagnosis not present

## 2017-06-06 DIAGNOSIS — M545 Low back pain: Secondary | ICD-10-CM | POA: Diagnosis not present

## 2017-06-08 DIAGNOSIS — E11649 Type 2 diabetes mellitus with hypoglycemia without coma: Secondary | ICD-10-CM | POA: Diagnosis not present

## 2017-06-08 DIAGNOSIS — M545 Low back pain: Secondary | ICD-10-CM | POA: Diagnosis not present

## 2017-06-08 DIAGNOSIS — M25611 Stiffness of right shoulder, not elsewhere classified: Secondary | ICD-10-CM | POA: Diagnosis not present

## 2017-06-08 DIAGNOSIS — M2560 Stiffness of unspecified joint, not elsewhere classified: Secondary | ICD-10-CM | POA: Diagnosis not present

## 2017-06-08 DIAGNOSIS — R262 Difficulty in walking, not elsewhere classified: Secondary | ICD-10-CM | POA: Diagnosis not present

## 2017-06-08 DIAGNOSIS — M6281 Muscle weakness (generalized): Secondary | ICD-10-CM | POA: Diagnosis not present

## 2017-06-08 DIAGNOSIS — M25612 Stiffness of left shoulder, not elsewhere classified: Secondary | ICD-10-CM | POA: Diagnosis not present

## 2017-06-13 DIAGNOSIS — E11649 Type 2 diabetes mellitus with hypoglycemia without coma: Secondary | ICD-10-CM | POA: Diagnosis not present

## 2017-06-13 DIAGNOSIS — M25611 Stiffness of right shoulder, not elsewhere classified: Secondary | ICD-10-CM | POA: Diagnosis not present

## 2017-06-13 DIAGNOSIS — M25612 Stiffness of left shoulder, not elsewhere classified: Secondary | ICD-10-CM | POA: Diagnosis not present

## 2017-06-13 DIAGNOSIS — M545 Low back pain: Secondary | ICD-10-CM | POA: Diagnosis not present

## 2017-06-13 DIAGNOSIS — M6281 Muscle weakness (generalized): Secondary | ICD-10-CM | POA: Diagnosis not present

## 2017-06-13 DIAGNOSIS — M2560 Stiffness of unspecified joint, not elsewhere classified: Secondary | ICD-10-CM | POA: Diagnosis not present

## 2017-06-13 DIAGNOSIS — R262 Difficulty in walking, not elsewhere classified: Secondary | ICD-10-CM | POA: Diagnosis not present

## 2017-06-15 DIAGNOSIS — M6281 Muscle weakness (generalized): Secondary | ICD-10-CM | POA: Diagnosis not present

## 2017-06-15 DIAGNOSIS — M25611 Stiffness of right shoulder, not elsewhere classified: Secondary | ICD-10-CM | POA: Diagnosis not present

## 2017-06-15 DIAGNOSIS — R262 Difficulty in walking, not elsewhere classified: Secondary | ICD-10-CM | POA: Diagnosis not present

## 2017-06-15 DIAGNOSIS — M2560 Stiffness of unspecified joint, not elsewhere classified: Secondary | ICD-10-CM | POA: Diagnosis not present

## 2017-06-15 DIAGNOSIS — M25612 Stiffness of left shoulder, not elsewhere classified: Secondary | ICD-10-CM | POA: Diagnosis not present

## 2017-06-15 DIAGNOSIS — E11649 Type 2 diabetes mellitus with hypoglycemia without coma: Secondary | ICD-10-CM | POA: Diagnosis not present

## 2017-06-15 DIAGNOSIS — M545 Low back pain: Secondary | ICD-10-CM | POA: Diagnosis not present

## 2017-06-16 DIAGNOSIS — M4696 Unspecified inflammatory spondylopathy, lumbar region: Secondary | ICD-10-CM | POA: Diagnosis not present

## 2017-06-16 DIAGNOSIS — E1159 Type 2 diabetes mellitus with other circulatory complications: Secondary | ICD-10-CM | POA: Diagnosis not present

## 2017-06-16 DIAGNOSIS — I739 Peripheral vascular disease, unspecified: Secondary | ICD-10-CM | POA: Diagnosis not present

## 2017-06-16 DIAGNOSIS — I10 Essential (primary) hypertension: Secondary | ICD-10-CM | POA: Diagnosis not present

## 2017-06-16 DIAGNOSIS — Z794 Long term (current) use of insulin: Secondary | ICD-10-CM | POA: Diagnosis not present

## 2017-06-16 DIAGNOSIS — E113211 Type 2 diabetes mellitus with mild nonproliferative diabetic retinopathy with macular edema, right eye: Secondary | ICD-10-CM | POA: Diagnosis not present

## 2017-06-16 DIAGNOSIS — N183 Chronic kidney disease, stage 3 (moderate): Secondary | ICD-10-CM | POA: Diagnosis not present

## 2017-06-16 DIAGNOSIS — F39 Unspecified mood [affective] disorder: Secondary | ICD-10-CM | POA: Diagnosis not present

## 2017-06-20 DIAGNOSIS — R262 Difficulty in walking, not elsewhere classified: Secondary | ICD-10-CM | POA: Diagnosis not present

## 2017-06-20 DIAGNOSIS — M25612 Stiffness of left shoulder, not elsewhere classified: Secondary | ICD-10-CM | POA: Diagnosis not present

## 2017-06-20 DIAGNOSIS — M2560 Stiffness of unspecified joint, not elsewhere classified: Secondary | ICD-10-CM | POA: Diagnosis not present

## 2017-06-20 DIAGNOSIS — E11649 Type 2 diabetes mellitus with hypoglycemia without coma: Secondary | ICD-10-CM | POA: Diagnosis not present

## 2017-06-20 DIAGNOSIS — M25611 Stiffness of right shoulder, not elsewhere classified: Secondary | ICD-10-CM | POA: Diagnosis not present

## 2017-06-20 DIAGNOSIS — M545 Low back pain: Secondary | ICD-10-CM | POA: Diagnosis not present

## 2017-06-20 DIAGNOSIS — M6281 Muscle weakness (generalized): Secondary | ICD-10-CM | POA: Diagnosis not present

## 2017-06-21 DIAGNOSIS — H2513 Age-related nuclear cataract, bilateral: Secondary | ICD-10-CM | POA: Diagnosis not present

## 2017-06-21 DIAGNOSIS — H4321 Crystalline deposits in vitreous body, right eye: Secondary | ICD-10-CM | POA: Diagnosis not present

## 2017-06-21 DIAGNOSIS — E113211 Type 2 diabetes mellitus with mild nonproliferative diabetic retinopathy with macular edema, right eye: Secondary | ICD-10-CM | POA: Diagnosis not present

## 2017-06-21 DIAGNOSIS — E113292 Type 2 diabetes mellitus with mild nonproliferative diabetic retinopathy without macular edema, left eye: Secondary | ICD-10-CM | POA: Diagnosis not present

## 2017-06-21 DIAGNOSIS — H43822 Vitreomacular adhesion, left eye: Secondary | ICD-10-CM | POA: Diagnosis not present

## 2017-06-22 DIAGNOSIS — M25611 Stiffness of right shoulder, not elsewhere classified: Secondary | ICD-10-CM | POA: Diagnosis not present

## 2017-06-22 DIAGNOSIS — M6281 Muscle weakness (generalized): Secondary | ICD-10-CM | POA: Diagnosis not present

## 2017-06-22 DIAGNOSIS — E11649 Type 2 diabetes mellitus with hypoglycemia without coma: Secondary | ICD-10-CM | POA: Diagnosis not present

## 2017-06-22 DIAGNOSIS — M545 Low back pain: Secondary | ICD-10-CM | POA: Diagnosis not present

## 2017-06-22 DIAGNOSIS — M2560 Stiffness of unspecified joint, not elsewhere classified: Secondary | ICD-10-CM | POA: Diagnosis not present

## 2017-06-22 DIAGNOSIS — R262 Difficulty in walking, not elsewhere classified: Secondary | ICD-10-CM | POA: Diagnosis not present

## 2017-06-22 DIAGNOSIS — M25612 Stiffness of left shoulder, not elsewhere classified: Secondary | ICD-10-CM | POA: Diagnosis not present

## 2017-06-27 DIAGNOSIS — M6281 Muscle weakness (generalized): Secondary | ICD-10-CM | POA: Diagnosis not present

## 2017-06-27 DIAGNOSIS — M545 Low back pain: Secondary | ICD-10-CM | POA: Diagnosis not present

## 2017-06-27 DIAGNOSIS — M25611 Stiffness of right shoulder, not elsewhere classified: Secondary | ICD-10-CM | POA: Diagnosis not present

## 2017-06-27 DIAGNOSIS — R262 Difficulty in walking, not elsewhere classified: Secondary | ICD-10-CM | POA: Diagnosis not present

## 2017-06-27 DIAGNOSIS — M2560 Stiffness of unspecified joint, not elsewhere classified: Secondary | ICD-10-CM | POA: Diagnosis not present

## 2017-06-27 DIAGNOSIS — E11649 Type 2 diabetes mellitus with hypoglycemia without coma: Secondary | ICD-10-CM | POA: Diagnosis not present

## 2017-06-27 DIAGNOSIS — M25612 Stiffness of left shoulder, not elsewhere classified: Secondary | ICD-10-CM | POA: Diagnosis not present

## 2017-06-29 DIAGNOSIS — R262 Difficulty in walking, not elsewhere classified: Secondary | ICD-10-CM | POA: Diagnosis not present

## 2017-06-29 DIAGNOSIS — E11649 Type 2 diabetes mellitus with hypoglycemia without coma: Secondary | ICD-10-CM | POA: Diagnosis not present

## 2017-06-29 DIAGNOSIS — M25611 Stiffness of right shoulder, not elsewhere classified: Secondary | ICD-10-CM | POA: Diagnosis not present

## 2017-06-29 DIAGNOSIS — M545 Low back pain: Secondary | ICD-10-CM | POA: Diagnosis not present

## 2017-06-29 DIAGNOSIS — M25612 Stiffness of left shoulder, not elsewhere classified: Secondary | ICD-10-CM | POA: Diagnosis not present

## 2017-06-29 DIAGNOSIS — M6281 Muscle weakness (generalized): Secondary | ICD-10-CM | POA: Diagnosis not present

## 2017-06-29 DIAGNOSIS — M2560 Stiffness of unspecified joint, not elsewhere classified: Secondary | ICD-10-CM | POA: Diagnosis not present

## 2017-06-30 DIAGNOSIS — E785 Hyperlipidemia, unspecified: Secondary | ICD-10-CM | POA: Diagnosis not present

## 2017-06-30 DIAGNOSIS — M25512 Pain in left shoulder: Secondary | ICD-10-CM | POA: Diagnosis not present

## 2017-06-30 DIAGNOSIS — Z79899 Other long term (current) drug therapy: Secondary | ICD-10-CM | POA: Diagnosis not present

## 2017-06-30 DIAGNOSIS — Z23 Encounter for immunization: Secondary | ICD-10-CM | POA: Diagnosis not present

## 2017-06-30 DIAGNOSIS — M19012 Primary osteoarthritis, left shoulder: Secondary | ICD-10-CM | POA: Diagnosis not present

## 2017-06-30 DIAGNOSIS — E119 Type 2 diabetes mellitus without complications: Secondary | ICD-10-CM | POA: Diagnosis not present

## 2017-07-11 DIAGNOSIS — E11649 Type 2 diabetes mellitus with hypoglycemia without coma: Secondary | ICD-10-CM | POA: Diagnosis not present

## 2017-07-11 DIAGNOSIS — M25611 Stiffness of right shoulder, not elsewhere classified: Secondary | ICD-10-CM | POA: Diagnosis not present

## 2017-07-11 DIAGNOSIS — M25612 Stiffness of left shoulder, not elsewhere classified: Secondary | ICD-10-CM | POA: Diagnosis not present

## 2017-07-11 DIAGNOSIS — M2560 Stiffness of unspecified joint, not elsewhere classified: Secondary | ICD-10-CM | POA: Diagnosis not present

## 2017-07-11 DIAGNOSIS — M6281 Muscle weakness (generalized): Secondary | ICD-10-CM | POA: Diagnosis not present

## 2017-07-11 DIAGNOSIS — M545 Low back pain: Secondary | ICD-10-CM | POA: Diagnosis not present

## 2017-07-11 DIAGNOSIS — R262 Difficulty in walking, not elsewhere classified: Secondary | ICD-10-CM | POA: Diagnosis not present

## 2017-07-30 DIAGNOSIS — M4696 Unspecified inflammatory spondylopathy, lumbar region: Secondary | ICD-10-CM | POA: Diagnosis not present

## 2017-07-30 DIAGNOSIS — E113211 Type 2 diabetes mellitus with mild nonproliferative diabetic retinopathy with macular edema, right eye: Secondary | ICD-10-CM | POA: Diagnosis not present

## 2017-07-30 DIAGNOSIS — F39 Unspecified mood [affective] disorder: Secondary | ICD-10-CM | POA: Diagnosis not present

## 2017-07-30 DIAGNOSIS — E1159 Type 2 diabetes mellitus with other circulatory complications: Secondary | ICD-10-CM | POA: Diagnosis not present

## 2017-07-30 DIAGNOSIS — I739 Peripheral vascular disease, unspecified: Secondary | ICD-10-CM | POA: Diagnosis not present

## 2017-07-30 DIAGNOSIS — I10 Essential (primary) hypertension: Secondary | ICD-10-CM | POA: Diagnosis not present

## 2017-07-30 DIAGNOSIS — N183 Chronic kidney disease, stage 3 (moderate): Secondary | ICD-10-CM | POA: Diagnosis not present

## 2017-07-30 DIAGNOSIS — Z794 Long term (current) use of insulin: Secondary | ICD-10-CM | POA: Diagnosis not present

## 2017-08-10 DIAGNOSIS — H4321 Crystalline deposits in vitreous body, right eye: Secondary | ICD-10-CM | POA: Diagnosis not present

## 2017-08-10 DIAGNOSIS — E113211 Type 2 diabetes mellitus with mild nonproliferative diabetic retinopathy with macular edema, right eye: Secondary | ICD-10-CM | POA: Diagnosis not present

## 2017-08-10 DIAGNOSIS — H2511 Age-related nuclear cataract, right eye: Secondary | ICD-10-CM | POA: Diagnosis not present

## 2017-08-30 DIAGNOSIS — E113211 Type 2 diabetes mellitus with mild nonproliferative diabetic retinopathy with macular edema, right eye: Secondary | ICD-10-CM | POA: Diagnosis not present

## 2017-08-30 DIAGNOSIS — I739 Peripheral vascular disease, unspecified: Secondary | ICD-10-CM | POA: Diagnosis not present

## 2017-08-30 DIAGNOSIS — E1159 Type 2 diabetes mellitus with other circulatory complications: Secondary | ICD-10-CM | POA: Diagnosis not present

## 2017-08-30 DIAGNOSIS — Z794 Long term (current) use of insulin: Secondary | ICD-10-CM | POA: Diagnosis not present

## 2017-08-30 DIAGNOSIS — F39 Unspecified mood [affective] disorder: Secondary | ICD-10-CM | POA: Diagnosis not present

## 2017-08-30 DIAGNOSIS — I10 Essential (primary) hypertension: Secondary | ICD-10-CM | POA: Diagnosis not present

## 2017-08-30 DIAGNOSIS — M4696 Unspecified inflammatory spondylopathy, lumbar region: Secondary | ICD-10-CM | POA: Diagnosis not present

## 2017-08-30 DIAGNOSIS — N183 Chronic kidney disease, stage 3 (moderate): Secondary | ICD-10-CM | POA: Diagnosis not present

## 2017-09-29 DIAGNOSIS — I10 Essential (primary) hypertension: Secondary | ICD-10-CM | POA: Diagnosis not present

## 2017-09-29 DIAGNOSIS — F39 Unspecified mood [affective] disorder: Secondary | ICD-10-CM | POA: Diagnosis not present

## 2017-09-29 DIAGNOSIS — E1159 Type 2 diabetes mellitus with other circulatory complications: Secondary | ICD-10-CM | POA: Diagnosis not present

## 2017-09-29 DIAGNOSIS — E113211 Type 2 diabetes mellitus with mild nonproliferative diabetic retinopathy with macular edema, right eye: Secondary | ICD-10-CM | POA: Diagnosis not present

## 2017-09-29 DIAGNOSIS — N183 Chronic kidney disease, stage 3 (moderate): Secondary | ICD-10-CM | POA: Diagnosis not present

## 2017-09-29 DIAGNOSIS — Z794 Long term (current) use of insulin: Secondary | ICD-10-CM | POA: Diagnosis not present

## 2017-09-29 DIAGNOSIS — I739 Peripheral vascular disease, unspecified: Secondary | ICD-10-CM | POA: Diagnosis not present

## 2017-09-29 DIAGNOSIS — M4696 Unspecified inflammatory spondylopathy, lumbar region: Secondary | ICD-10-CM | POA: Diagnosis not present

## 2017-10-30 DIAGNOSIS — I10 Essential (primary) hypertension: Secondary | ICD-10-CM | POA: Diagnosis not present

## 2017-10-30 DIAGNOSIS — I739 Peripheral vascular disease, unspecified: Secondary | ICD-10-CM | POA: Diagnosis not present

## 2017-10-30 DIAGNOSIS — E1159 Type 2 diabetes mellitus with other circulatory complications: Secondary | ICD-10-CM | POA: Diagnosis not present

## 2017-10-30 DIAGNOSIS — E1129 Type 2 diabetes mellitus with other diabetic kidney complication: Secondary | ICD-10-CM | POA: Diagnosis not present

## 2017-10-30 DIAGNOSIS — N183 Chronic kidney disease, stage 3 (moderate): Secondary | ICD-10-CM | POA: Diagnosis not present

## 2017-11-07 DIAGNOSIS — M4712 Other spondylosis with myelopathy, cervical region: Secondary | ICD-10-CM | POA: Diagnosis not present

## 2017-11-07 DIAGNOSIS — Z6834 Body mass index (BMI) 34.0-34.9, adult: Secondary | ICD-10-CM | POA: Diagnosis not present

## 2017-11-07 DIAGNOSIS — R030 Elevated blood-pressure reading, without diagnosis of hypertension: Secondary | ICD-10-CM | POA: Diagnosis not present

## 2017-11-21 DIAGNOSIS — F39 Unspecified mood [affective] disorder: Secondary | ICD-10-CM | POA: Diagnosis not present

## 2017-11-21 DIAGNOSIS — E1159 Type 2 diabetes mellitus with other circulatory complications: Secondary | ICD-10-CM | POA: Diagnosis not present

## 2017-11-21 DIAGNOSIS — M2550 Pain in unspecified joint: Secondary | ICD-10-CM | POA: Diagnosis not present

## 2017-11-21 DIAGNOSIS — E113292 Type 2 diabetes mellitus with mild nonproliferative diabetic retinopathy without macular edema, left eye: Secondary | ICD-10-CM | POA: Diagnosis not present

## 2017-11-21 DIAGNOSIS — E1122 Type 2 diabetes mellitus with diabetic chronic kidney disease: Secondary | ICD-10-CM | POA: Diagnosis not present

## 2017-11-21 DIAGNOSIS — E1165 Type 2 diabetes mellitus with hyperglycemia: Secondary | ICD-10-CM | POA: Diagnosis not present

## 2017-11-21 DIAGNOSIS — H4321 Crystalline deposits in vitreous body, right eye: Secondary | ICD-10-CM | POA: Diagnosis not present

## 2017-11-21 DIAGNOSIS — E1129 Type 2 diabetes mellitus with other diabetic kidney complication: Secondary | ICD-10-CM | POA: Diagnosis not present

## 2017-11-21 DIAGNOSIS — R0600 Dyspnea, unspecified: Secondary | ICD-10-CM | POA: Diagnosis not present

## 2017-11-21 DIAGNOSIS — I10 Essential (primary) hypertension: Secondary | ICD-10-CM | POA: Diagnosis not present

## 2017-11-21 DIAGNOSIS — H43822 Vitreomacular adhesion, left eye: Secondary | ICD-10-CM | POA: Diagnosis not present

## 2017-11-21 DIAGNOSIS — E113211 Type 2 diabetes mellitus with mild nonproliferative diabetic retinopathy with macular edema, right eye: Secondary | ICD-10-CM | POA: Diagnosis not present

## 2017-11-21 DIAGNOSIS — E11319 Type 2 diabetes mellitus with unspecified diabetic retinopathy without macular edema: Secondary | ICD-10-CM | POA: Diagnosis not present

## 2017-11-21 DIAGNOSIS — N390 Urinary tract infection, site not specified: Secondary | ICD-10-CM | POA: Diagnosis not present

## 2017-11-21 DIAGNOSIS — M810 Age-related osteoporosis without current pathological fracture: Secondary | ICD-10-CM | POA: Diagnosis not present

## 2017-11-21 DIAGNOSIS — I739 Peripheral vascular disease, unspecified: Secondary | ICD-10-CM | POA: Diagnosis not present

## 2017-11-21 DIAGNOSIS — H2513 Age-related nuclear cataract, bilateral: Secondary | ICD-10-CM | POA: Diagnosis not present

## 2017-11-21 DIAGNOSIS — E785 Hyperlipidemia, unspecified: Secondary | ICD-10-CM | POA: Diagnosis not present

## 2017-11-21 DIAGNOSIS — Z79899 Other long term (current) drug therapy: Secondary | ICD-10-CM | POA: Diagnosis not present

## 2017-11-28 ENCOUNTER — Telehealth: Payer: Self-pay

## 2017-11-28 DIAGNOSIS — I10 Essential (primary) hypertension: Secondary | ICD-10-CM | POA: Diagnosis not present

## 2017-11-28 DIAGNOSIS — Z1389 Encounter for screening for other disorder: Secondary | ICD-10-CM | POA: Diagnosis not present

## 2017-11-28 DIAGNOSIS — J3089 Other allergic rhinitis: Secondary | ICD-10-CM | POA: Diagnosis not present

## 2017-11-28 DIAGNOSIS — K5909 Other constipation: Secondary | ICD-10-CM | POA: Diagnosis not present

## 2017-11-28 DIAGNOSIS — M5030 Other cervical disc degeneration, unspecified cervical region: Secondary | ICD-10-CM | POA: Diagnosis not present

## 2017-11-28 DIAGNOSIS — R0683 Snoring: Secondary | ICD-10-CM | POA: Diagnosis not present

## 2017-11-28 DIAGNOSIS — Z6836 Body mass index (BMI) 36.0-36.9, adult: Secondary | ICD-10-CM | POA: Diagnosis not present

## 2017-11-28 DIAGNOSIS — Z13820 Encounter for screening for osteoporosis: Secondary | ICD-10-CM | POA: Diagnosis not present

## 2017-11-28 DIAGNOSIS — M5136 Other intervertebral disc degeneration, lumbar region: Secondary | ICD-10-CM | POA: Diagnosis not present

## 2017-11-28 NOTE — Telephone Encounter (Signed)
lmov  To call office re: medical records  Dr. Okey DupreEnd received in the mail patient medical records but patient has not been seen in the epic system by cardio inpatient or outpatient

## 2017-11-30 DIAGNOSIS — F39 Unspecified mood [affective] disorder: Secondary | ICD-10-CM | POA: Diagnosis not present

## 2017-11-30 DIAGNOSIS — E11319 Type 2 diabetes mellitus with unspecified diabetic retinopathy without macular edema: Secondary | ICD-10-CM | POA: Diagnosis not present

## 2017-11-30 DIAGNOSIS — E1122 Type 2 diabetes mellitus with diabetic chronic kidney disease: Secondary | ICD-10-CM | POA: Diagnosis not present

## 2017-11-30 DIAGNOSIS — I10 Essential (primary) hypertension: Secondary | ICD-10-CM | POA: Diagnosis not present

## 2017-11-30 DIAGNOSIS — I739 Peripheral vascular disease, unspecified: Secondary | ICD-10-CM | POA: Diagnosis not present

## 2017-11-30 DIAGNOSIS — E1159 Type 2 diabetes mellitus with other circulatory complications: Secondary | ICD-10-CM | POA: Diagnosis not present

## 2017-12-04 DIAGNOSIS — H25813 Combined forms of age-related cataract, bilateral: Secondary | ICD-10-CM | POA: Diagnosis not present

## 2017-12-04 DIAGNOSIS — H524 Presbyopia: Secondary | ICD-10-CM | POA: Diagnosis not present

## 2017-12-04 DIAGNOSIS — H353212 Exudative age-related macular degeneration, right eye, with inactive choroidal neovascularization: Secondary | ICD-10-CM | POA: Diagnosis not present

## 2017-12-27 DIAGNOSIS — D649 Anemia, unspecified: Secondary | ICD-10-CM | POA: Diagnosis not present

## 2018-01-08 ENCOUNTER — Institutional Professional Consult (permissible substitution): Payer: Self-pay | Admitting: Neurology

## 2018-01-16 ENCOUNTER — Encounter: Payer: Self-pay | Admitting: Neurology

## 2018-01-17 ENCOUNTER — Encounter: Payer: Self-pay | Admitting: Neurology

## 2018-01-17 ENCOUNTER — Ambulatory Visit (INDEPENDENT_AMBULATORY_CARE_PROVIDER_SITE_OTHER): Payer: Medicare Other | Admitting: Neurology

## 2018-01-17 VITALS — BP 143/76 | HR 80 | Ht 67.0 in | Wt 225.0 lb

## 2018-01-17 DIAGNOSIS — G4730 Sleep apnea, unspecified: Secondary | ICD-10-CM

## 2018-01-17 DIAGNOSIS — G4710 Hypersomnia, unspecified: Secondary | ICD-10-CM | POA: Insufficient documentation

## 2018-01-17 DIAGNOSIS — R0683 Snoring: Secondary | ICD-10-CM

## 2018-01-17 DIAGNOSIS — G4719 Other hypersomnia: Secondary | ICD-10-CM | POA: Diagnosis not present

## 2018-01-17 DIAGNOSIS — G4711 Idiopathic hypersomnia with long sleep time: Secondary | ICD-10-CM | POA: Diagnosis not present

## 2018-01-17 DIAGNOSIS — G4752 REM sleep behavior disorder: Secondary | ICD-10-CM

## 2018-01-17 NOTE — Patient Instructions (Signed)

## 2018-01-17 NOTE — Progress Notes (Signed)
SLEEP MEDICINE CLINIC   Provider:  Melvyn Novas, M D  Primary Care Physician:  Rodrigo Ran, MD   Referring Provider: Rodrigo Ran, MD   Chief Complaint  Patient presents with  . New Patient (Initial Visit)    pt with daughter, rm 43. pt states that she has been told she snores  and witnessed her stopping breathing in her sleep. she is sleeping alot and tired through the day.     HPI:  ALORIA LOOPER is a 84 y.o. female , seen here in a referral from Dr. Waynard Edwards for a sleep evaluation,   Mrs. Pelzer is an 84 year old African-American female patient followed by Dr. Sherrye Payor for primary care needs.  She brought her daughter to this visit today, on 17 January 2018.  The patient has a history and carries a diagnosis of hypertension, hyperlipidemia diabetes mellitus type 2 which was diagnosed about 20 years ago, allergic rhinitis iron deficiency anemia supplemented with iron since 2014, osteoarthritis, and surgical history of discectomy and fusion of the lower back between L3 and L5 by Dr. Tressie Stalker in 2014, bunion surgery and a breast biopsy 15 years ago.  The patient is referred for a sleep apnea evaluation.   Chief complaint according to patient : Mrs. Sime lives with his daughter and son-in-law and both have witnessed her to snore loudly, there is a monitor in her room so that she can ask for help but it also serves the other way. Her son in law is a paramedic who witnessed snoring, crescendo breathing, apnea and sleep talking.  She will talk loud in her sleep, sometimes yells, kicks - but doesn;t leave the bed. She will sometimes have this sleep behavior when sleeping in recliner. She used to have frequent nocturia 3-5 times before she changed her tea drinking habits.   Sleep habits are as follows:- her bedtime is 9 PM, and she is promptly asleep, on her back- on one pillow- staying asleep unless for bathroom breaks ( 1-2), sometimes waking up as early at 4 AM- rises at 9 AM. CNA  comes in at 9.30 and helps her up. She spends 12 hours in bed. She sleeps 8 - 10 hours .  The bedroom is warm but quiet and dark. Her hands are often cold.  Sleep medical history and family sleep history:  Son has OSA, was morbidly obese. Had bariatric surgery.  Daughter snores,  The patient does not recall having sleepwalking episodes in childhood, there was no  history of night terrors or enuresis. No TBI. Tonsillectomy in childhood.   Social history:  Widowed for 22 years-, 4 adult children, 2 daughters and 2 sons, lives with [place] in [place]. Non smoker, ETOH - seldomly - family occasions.  Caffeine -  She likes iced tea. Had to cut down due to nocturia.   Review of Systems: Out of a complete 14 system review, the patient complains of only the following symptoms, and all other reviewed systems are negative. Snoring, vivid dreams, cold fingers and hands. Sleep talking, mumbling - some animal sounds.    Epworth score  9/ 24  , Fatigue severity score 55  ,geriatric  depression score   2/ 15   Social History   Socioeconomic History  . Marital status: Widowed    Spouse name: Not on file  . Number of children: Not on file  . Years of education: Not on file  . Highest education level: Not on file  Social Needs  . Financial  resource strain: Not on file  . Food insecurity - worry: Not on file  . Food insecurity - inability: Not on file  . Transportation needs - medical: Not on file  . Transportation needs - non-medical: Not on file  Occupational History  . Not on file  Tobacco Use  . Smoking status: Never Smoker  . Smokeless tobacco: Never Used  Substance and Sexual Activity  . Alcohol use: No  . Drug use: No  . Sexual activity: Not on file  Other Topics Concern  . Not on file  Social History Narrative  . Not on file    No family history on file.  Past Medical History:  Diagnosis Date  . Anemia    hx of  . Anxiety   . Arthritis   . Breast lump    hx of  . Cataract     left eye  . Chronic back pain   . Diabetes mellitus   . Hyperlipidemia   . Hypertension    sees Dr. Margret Chancehristine Bounous, [facility] medicine  . Normochromic normocytic anemia 05/22/2017   Hb 10 MCV 85.6 No monoclonal spike on SPEP  Creatinine 1.3-1.6  . Peripheral vascular disease [facility])     Past Surgical History:  Procedure Laterality Date  . APPENDECTOMY    . BACK SURGERY    . BREAST SURGERY     cyst removed left side  . BUNIONECTOMY    . CARDIOVASCULAR STRESS TEST    . COLONOSCOPY    . DILATION AND CURETTAGE OF UTERUS    . TONSILLECTOMY      Current Outpatient Medications  Medication Sig Dispense Refill  . amLODipine (NORVASC) 10 MG tablet Take 10 mg by mouth daily.    Marland Kitchen. aspirin 81 MG tablet Take 81 mg by mouth daily.    . cetirizine (ZYRTEC) 10 MG tablet Take 10 mg by mouth daily.    . Cholecalciferol (VITAMIN D3) 5000 units TABS Take 1 tablet by mouth daily.    . Ferrous Gluconate (IRON) 240 (27 FE) MG TABS Take 1 tablet by mouth every other day.     . Glucos-Chond-Hyal Ac-Ca Fructo (MOVE FREE JOINT HEALTH ADVANCE PO) Take 1 tablet by mouth daily.    Marland Kitchen. glyBURIDE-metformin (GLUCOVANCE) 5-500 MG per tablet Take 1 tablet by mouth 2 (two) times daily with a meal.    . LORazepam (ATIVAN) 0.5 MG tablet Take 0.5 mg by mouth daily as needed. For anxiety    . metFORMIN (GLUCOPHAGE) 500 MG tablet Take 500 mg by mouth 2 (two) times daily with a meal.    . methocarbamol (ROBAXIN) 500 MG tablet Take 1 tablet (500 mg total) by mouth every 8 (eight) hours as needed. 60 tablet 0  . pantoprazole (PROTONIX) 40 MG tablet Take 1 tablet (40 mg total) by mouth daily before breakfast. 30 tablet 1  . rosuvastatin (CRESTOR) 10 MG tablet Take 5 mg by mouth daily.    Marland Kitchen. senna-docusate (SENOKOT-S) 8.6-50 MG per tablet Take 2 tablets by mouth at bedtime.    Marland Kitchen. telmisartan (MICARDIS) 40 MG tablet Take 40 mg by mouth daily.    Marland Kitchen. tolterodine (DETROL) 2 MG tablet Take 2 mg by mouth daily.    . vitamin  B-12 (CYANOCOBALAMIN) 1000 MCG tablet Take 1,000 mcg by mouth daily. OTC     No current facility-administered medications for this visit.     Allergies as of 01/17/2018 - Review Complete 01/17/2018  Allergen Reaction Noted  . Januvia [sitagliptin] Other (  See Comments) 04/20/2012  . Simvastatin Other (See Comments) 04/20/2012    Vitals: BP (!) 143/76   Pulse 80   Ht 5\' 7"  (1.702 m)   Wt 225 lb (102.1 kg)   BMI 35.24 kg/m  Last Weight:  Wt Readings from Last 1 Encounters:  01/17/18 225 lb (102.1 kg)   ZOX:WRUE mass index is 35.24 kg/m.     Last Height:   Ht Readings from Last 1 Encounters:  01/17/18 5\' 7"  (1.702 m)    Physical exam:  General: The patient is awake, alert and appears not in acute distress. The patient is well groomed. Head: Normocephalic, atraumatic. Neck is supple. Mallampati 4- low soft palate, midline, pale mucosa .  neck circumference: 16.25 ". Nasal airflow patent , Retrognathia is mild.   Cardiovascular:  Regular rate and rhythm , without  murmurs or carotid bruit, and without distended neck veins. Respiratory: Lungs are clear to auscultation. Skin:  Without evidence of edema, or rash Trunk: BMI is 35. The patient's posture is erect  Neurologic exam :The patient is awake and alert, oriented to place and time.   Memory subjective  described as impaired Attention span & concentration ability appears normal. Speech is  without dysarthria, dysphonia or aphasia.  Mood and affect are appropriate.  Cranial nerves: Reports " god " taste and smell. Pupils are equal and briskly reactive to light. Funduscopic exam- macular degeneration- wet type.  Extraocular movements  in vertical and horizontal planes intact and without nystagmus. Visual fields by finger perimetry are intact. Hearing to finger rub intact. Facial sensation intact to fine touch. Facial motor strength is symmetric and tongue and uvula move midline. Shoulder shrug was symmetrical.  Motor exam:  Normal tone, muscle bulk and symmetric strength in all extremities. Sensory:  Fine touch, pinprick and vibration were tested in all extremities. Proprioception tested in the upper extremities was normal. Coordination: Rapid alternating movements in the fingers/hands was normal. Finger-to-nose maneuver  normal without evidence of ataxia, dysmetria or tremor. Gait and station: Patient walks without assistive device and is able unassisted to climb up to the exam table. Strength within normal limits.  Stance is stable and normal. Tandem gait deferred  Turns with 3-4  Steps.  Deep tendon reflexes: in the  upper and lower extremities are 1 plus  symmetric and intact. Babinski maneuver response is downgoing.  Assessment:  After physical and neurologic examination, review of laboratory studies,  Personal review of imaging studies, reports of other /same  Imaging studies, results of polysomnography and / or neurophysiology testing and pre-existing records as far as provided in visit., my assessment is   1) Possible REM BD - onset time not known, but since patient lives with her daughter ( Nov 2018 )-she had several nights with talking, mumbling . audible loud snoring, interrupted by apnea. 2) OSA, audible loud snoring, interrupted by apnea. 3) hypersomnia, prolonged sleep time,    The patient was advised of the nature of the diagnosed disorder , the treatment options and the  risks for general health and wellness arising from not treating the condition.   I spent more than 55 minutes of face to face time with the patient.  Greater than 50% of time was spent in counseling and coordination of care. We have discussed the diagnosis and differential and I answered the patient's questions.    Plan:  Treatment plan and additional workup :  I would like to thank Dr. Rodrigo Ran for sending Lalanya Rufener my way  today, I think Mrs. Knox Saliva has likely to sleep disorders one a REM behavior disorder which culminates in  sleep talking and sometimes acting out a dream but not sleepwalking.  She would not leave her bed but she may yell or mumble or kick of rash.  In addition there is clear evidence of apnea as the family has noted irregular nocturnal breathing, crescendo snoring which then is suddenly interrupted by a gap in breathing.  So I have no doubt that sleep apnea is there is a question as to what severity and if it is associated with low oxygen levels.  If her apnea is very mild and she is mainly a loud snorer, she may be able to get by with a dental device.  If she does have significant oxygen desaturation issues she would be best served with a CPAP or other modality of positive airway pressure.  I would like for her to undergo an attended sleep study.  I will order the sleep study as a split-night with parasomnia montage.  I will advised the patient and her daughter here today that I would not be in the country from 8 April through the 27th, I would prefer for her to have her sleep study either in the very near future or during the last 10 days of vacasion so that she does not have to wait that long for results.   Melvyn Novas, MD 01/17/2018, 3:30 PM  Certified in Neurology by ABPN Certified in Sleep Medicine by [facility] [address], [address] [place], [place] [address]

## 2018-01-28 DIAGNOSIS — E785 Hyperlipidemia, unspecified: Secondary | ICD-10-CM | POA: Diagnosis not present

## 2018-01-28 DIAGNOSIS — I10 Essential (primary) hypertension: Secondary | ICD-10-CM | POA: Diagnosis not present

## 2018-01-28 DIAGNOSIS — I739 Peripheral vascular disease, unspecified: Secondary | ICD-10-CM | POA: Diagnosis not present

## 2018-01-28 DIAGNOSIS — E1169 Type 2 diabetes mellitus with other specified complication: Secondary | ICD-10-CM | POA: Diagnosis not present

## 2018-01-28 DIAGNOSIS — F39 Unspecified mood [affective] disorder: Secondary | ICD-10-CM | POA: Diagnosis not present

## 2018-01-28 DIAGNOSIS — E1122 Type 2 diabetes mellitus with diabetic chronic kidney disease: Secondary | ICD-10-CM | POA: Diagnosis not present

## 2018-01-28 DIAGNOSIS — E113211 Type 2 diabetes mellitus with mild nonproliferative diabetic retinopathy with macular edema, right eye: Secondary | ICD-10-CM | POA: Diagnosis not present

## 2018-01-28 DIAGNOSIS — E1159 Type 2 diabetes mellitus with other circulatory complications: Secondary | ICD-10-CM | POA: Diagnosis not present

## 2018-02-15 ENCOUNTER — Ambulatory Visit (INDEPENDENT_AMBULATORY_CARE_PROVIDER_SITE_OTHER): Payer: Medicare Other | Admitting: Neurology

## 2018-02-15 DIAGNOSIS — R0683 Snoring: Secondary | ICD-10-CM

## 2018-02-15 DIAGNOSIS — G4730 Sleep apnea, unspecified: Secondary | ICD-10-CM

## 2018-02-15 DIAGNOSIS — G4752 REM sleep behavior disorder: Secondary | ICD-10-CM

## 2018-02-15 DIAGNOSIS — G4710 Hypersomnia, unspecified: Secondary | ICD-10-CM

## 2018-02-15 DIAGNOSIS — G4733 Obstructive sleep apnea (adult) (pediatric): Secondary | ICD-10-CM

## 2018-02-15 DIAGNOSIS — G4711 Idiopathic hypersomnia with long sleep time: Secondary | ICD-10-CM

## 2018-02-15 DIAGNOSIS — G4719 Other hypersomnia: Secondary | ICD-10-CM

## 2018-02-20 DIAGNOSIS — H43822 Vitreomacular adhesion, left eye: Secondary | ICD-10-CM | POA: Diagnosis not present

## 2018-02-20 DIAGNOSIS — E113211 Type 2 diabetes mellitus with mild nonproliferative diabetic retinopathy with macular edema, right eye: Secondary | ICD-10-CM | POA: Diagnosis not present

## 2018-02-20 DIAGNOSIS — H2513 Age-related nuclear cataract, bilateral: Secondary | ICD-10-CM | POA: Diagnosis not present

## 2018-02-20 DIAGNOSIS — H4321 Crystalline deposits in vitreous body, right eye: Secondary | ICD-10-CM | POA: Diagnosis not present

## 2018-02-20 DIAGNOSIS — E113292 Type 2 diabetes mellitus with mild nonproliferative diabetic retinopathy without macular edema, left eye: Secondary | ICD-10-CM | POA: Diagnosis not present

## 2018-03-02 DIAGNOSIS — D890 Polyclonal hypergammaglobulinemia: Secondary | ICD-10-CM | POA: Diagnosis not present

## 2018-03-02 DIAGNOSIS — E119 Type 2 diabetes mellitus without complications: Secondary | ICD-10-CM | POA: Diagnosis not present

## 2018-03-02 DIAGNOSIS — J3089 Other allergic rhinitis: Secondary | ICD-10-CM | POA: Diagnosis not present

## 2018-03-02 DIAGNOSIS — D6489 Other specified anemias: Secondary | ICD-10-CM | POA: Diagnosis not present

## 2018-03-02 DIAGNOSIS — K5909 Other constipation: Secondary | ICD-10-CM | POA: Diagnosis not present

## 2018-03-02 DIAGNOSIS — Z6834 Body mass index (BMI) 34.0-34.9, adult: Secondary | ICD-10-CM | POA: Diagnosis not present

## 2018-03-02 DIAGNOSIS — I10 Essential (primary) hypertension: Secondary | ICD-10-CM | POA: Diagnosis not present

## 2018-03-02 DIAGNOSIS — M859 Disorder of bone density and structure, unspecified: Secondary | ICD-10-CM | POA: Diagnosis not present

## 2018-03-02 DIAGNOSIS — G4733 Obstructive sleep apnea (adult) (pediatric): Secondary | ICD-10-CM | POA: Diagnosis not present

## 2018-03-02 NOTE — Procedures (Signed)
PATIENT'S NAME:  [name] Mathews, [name] Mathews DOB:      [birth year]      MR#:    [id number]     DATE OF RECORDING: 02/15/2018 REFERRING M.D.:  Rodrigo Ran, M.D Study Performed:   Baseline Polysomnogram HISTORY: [name] Mathews is an 84 year old African-American female patient, referred for a sleep apnea evaluation. Chief complaint according to patient: [name] Mathews lives with his daughter and son-in-law and both have witnessed her to snore loudly, there is a monitor in her room so that she can ask for help but it also serves the other way. Her son in law is a paramedic who witnessed snoring, crescendo breathing, apnea and sleep talking. She will talk loud in her sleep, sometimes yells, kicks - but does not leave the bed. She will sometimes have this sleep behavior when sleeping in recliner. She used to have frequent nocturia 3-5 times before she changed her tea drinking habits.    REM behavior disorder, Anxiety, Diabetes, and Hypertension.  The patient endorsed the Epworth Sleepiness Scale at 9/24 points.  The patient's weight 225 pounds with a height of 67 (inches), resulting in a BMI of 35.3 kg/m2.The patient's neck circumference measured 16.2 inches.  CURRENT MEDICATIONS: Norvasc, Aspirin, Zyrtec, Vitamin D, Iron, Glucovance, Ativan, Glucophage, Robaxin, Protonix, Crestor, Senokot, Micardis, Detrol, Cyanocobalamin.   PROCEDURE:  This is a multichannel digital polysomnogram utilizing the SomnoStar 11.2 system.  Electrodes and sensors were applied and monitored per AASM Specifications.   EEG, EOG, Chin and Limb EMG, were sampled at 200 Hz.  ECG, Snore and Nasal Pressure, Thermal Airflow, Respiratory Effort, CPAP Flow and Pressure, Oximetry was sampled at 50 Hz. Digital video and audio were recorded.      BASELINE STUDY: Lights Out was at 22:34 and Lights On at 05:09.  Total recording time (TRT) was 395.5 minutes, with a total sleep time (TST) of 334 minutes.   The patient's sleep latency was 5 minutes.  REM latency  was 58 minutes.  The sleep efficiency was 84.5 %.     SLEEP ARCHITECTURE: WASO (Wake after sleep onset) was 59.5 minutes.  There were 16 minutes in Stage N1, 146 minutes Stage N2, 18.5 minutes Stage N3 and 153.5 minutes in Stage REM.  The percentage of Stage N1 was 4.8%, Stage N2 was 43.7%, Stage N3 was 5.5% and Stage R (REM sleep) was 46.%.    RESPIRATORY ANALYSIS:  There were a total of 197 respiratory events:  175 obstructive apneas, 7 central apneas and 15 hypopneas. The patient also had 0 respiratory event related arousals (RERAs).   The total APNEA/HYPOPNEA INDEX (AHI) was 35.40 /hour and the total RESPIRATORY DISTURBANCE INDEX was 35.4 /hour.  74 events occurred in REM sleep and 11 events in NREM. The REM AHI was 28.9 /hour, versus a non-REM AHI of 40.9. The patient slept only in the supine position .  OXYGEN SATURATION & C02:  The Wake baseline 02 saturation was 0%, with the lowest being 83%. Time spent below 89% saturation equaled 7 minutes.   PERIODIC LIMB MOVEMENTS:  The patient had a total of 111 Periodic Limb Movements.  The Periodic Limb Movement (PLM) index was 19.9 and the PLM Arousal index was 1.3/hour.  The arousals were noted as: 21 were spontaneous, 7 were associated with PLMs, and 15 were associated with respiratory events. Audio and video analysis positive for sleep talking, phonations and vocalizations.   Snoring was noted. EKG was in keeping with normal sinus rhythm (NSR).  IMPRESSION: Severe Obstructive Sleep  Apnea (OSA) without REM accentuation and without hypoxemia. Snoring.    1. Confusional Arousals / sleep talking are part of REM Parasomnia, Unspecified. 2.  High REM sleep proportion of sleep.  RECOMMENDATIONS:  1. Advise full-night, attended, CPAP titration study to optimize therapy.   2. Medication treatment as needed should the patients sleep talking advance to sleep walking, complex activities. Post-study, the patient indicated that sleep was worse than  usual. I still want to encourage her to return. I certify that I have reviewed the entire raw data recording prior to the issuance of this report in accordance with the Standards of Accreditation of the American Academy of Sleep Medicine (AASM)      Melvyn Novas, MD   03-02-2018  Diplomat, American Board of Psychiatry and Neurology  Diplomat, American Board of Sleep Medicine Medical Director, [facility] [employer]

## 2018-03-02 NOTE — Addendum Note (Signed)
Addended by: Melvyn Novas on: 03/02/2018 02:07 PM   Modules accepted: Orders

## 2018-03-05 ENCOUNTER — Telehealth: Payer: Self-pay

## 2018-03-05 NOTE — Telephone Encounter (Signed)
Spoke with daughter about results of sleep study.  Daughter understood results and ready to schedule pt for CPAP study.  Daughter just had foot surgery and is unable to have mother come back for CPAP until able to walk.  Pt is scheduled for June 19th.

## 2018-04-18 ENCOUNTER — Ambulatory Visit (INDEPENDENT_AMBULATORY_CARE_PROVIDER_SITE_OTHER): Payer: Medicare Other | Admitting: Neurology

## 2018-04-18 DIAGNOSIS — R0683 Snoring: Secondary | ICD-10-CM

## 2018-04-18 DIAGNOSIS — G4710 Hypersomnia, unspecified: Secondary | ICD-10-CM

## 2018-04-18 DIAGNOSIS — G4730 Sleep apnea, unspecified: Secondary | ICD-10-CM

## 2018-04-18 DIAGNOSIS — G4711 Idiopathic hypersomnia with long sleep time: Secondary | ICD-10-CM

## 2018-04-18 DIAGNOSIS — G4719 Other hypersomnia: Secondary | ICD-10-CM

## 2018-04-18 DIAGNOSIS — G4752 REM sleep behavior disorder: Secondary | ICD-10-CM

## 2018-04-18 DIAGNOSIS — G4733 Obstructive sleep apnea (adult) (pediatric): Secondary | ICD-10-CM

## 2018-04-20 NOTE — Procedures (Signed)
PATIENT'S NAME:  [name], Mathews DOB:      01-03-1934      MR#:    [id number]     DATE OF RECORDING: 04/18/2018 REFERRING M.D.:  Rodrigo Ran, M.D. Study Performed:   Titration to Positive Airway Pressure  HISTORY:  [name] Mathews is an 84 year old female patient who returns for CPAP titration after a baseline PSG on 02/15/18 showed an AHI of 35.4/h, REM AHI 28.9/h, without prolonged time in hypoxia. The patient had clinically described REM behaviors, loud snoring and daytime sleepiness.   The patient endorsed the Epworth Sleepiness Scale at 9/24 points. FSS at 55 points.  The patient's weight 225 pounds with a height of 67 (inches), resulting in a BMI of 35.3 kg/m2. The patient's neck circumference measured 16 inches.  CURRENT MEDICATIONS: Norvasc, Aspirin, Zyrtec, Vitamin D, Iron, Glucovance, Ativan, Glucophage, Robaxin, Protonix, Crestor, Senokot, Micardis, Detrol, Cyanocobalamin.   PROCEDURE:  This is a multichannel digital polysomnogram utilizing the SomnoStar 11.2 system.  Electrodes and sensors were applied and monitored per AASM Specifications.   EEG, EOG, Chin and Limb EMG, were sampled at 200 Hz.  ECG, Snore and Nasal Pressure, Thermal Airflow, Respiratory Effort, CPAP Flow and Pressure, Oximetry was sampled at 50 Hz. Digital video and audio were recorded.      CPAP was initiated at 5 cmH20 with heated humidity per AASM split night standards and pressure was advanced to CPAP at 15 cm water, higher pressures caused high apnea indices and reduced the total sleep time. BiPAP was explored, beginning at 17/13 cm water and finally reaching 22/18 RR 12cmH20 because of hypopneas, apneas and desaturations.  At a BiPAP pressure of 22/18 cm water RR /ST of 12, there was further exacerbation of the AHI ( 60/h)  without improvement of sleep apnea. The only pressure achieving a reduction in AHI was at 6 cm water CPAP.  Lights Out was at 21:09 and Lights On at 05:03. Total recording time (TRT) was 474  minutes, with a total sleep time (TST) of 296.5 minutes. The patient's sleep latency was 2 minutes. REM latency was 84 minutes.  The sleep efficiency was 62.6 %.    SLEEP ARCHITECTURE: WASO (Wake after sleep onset) was 171.5 minutes.  There were 17.5 minutes in Stage N1, 115.5 minutes Stage N2, 71.5 minutes Stage N3 and 92 minutes in Stage REM.  The percentage of Stage N1 was 5.9%, Stage N2 was 39.%, Stage N3 was 24.1% and Stage R (REM sleep) was 31.%.   RESPIRATORY ANALYSIS:  There was a total of 113 respiratory events: 83 obstructive apneas, 26 central apneas and 0 mixed apneas with a total of 109 apneas and an apnea index (AI) of 22.1 /hour. There were 4 hypopneas with a hypopnea index of .8/hour. The patient also had 0 respiratory event related arousals (RERAs). The total APNEA/HYPOPNEA INDEX (AHI) was 22.90. /hour and the total RESPIRATORY DISTURBANCE INDEX was 22.9/hour.  56 events occurred in REM sleep and 57 events in NREM. The REM AHI was 36.5 /hour versus a non-REM AHI of 16.7 /hour.  The patient spent 296.5 minutes of total sleep time in the supine position and 0 minutes in non-supine. The supine AHI was 22.9/h, versus a non-supine AHI of 0.0/h.  OXYGEN SATURATION & C02:  The baseline 02 saturation was 94%, with the lowest being 82%. Time spent below 89% saturation equaled 6 minutes.  PERIODIC LIMB MOVEMENTS:  The patient had a total of 61 Periodic Limb Movements. The Periodic Limb Movement (  PLM) index was 12.3 and the PLM Arousal index was 0.4 /hour. The arousals were noted as: 24 were spontaneous, 2 were associated with PLMs, and 7 were associated with respiratory events. Post-study, the patient indicated that sleep was less sound than usual.   DIAGNOSIS 1. Primary Snoring 2. Obstructive Sleep Apnea 3. REM sleep activity - sleep talking cannot be seen under CPAP.  4. 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    PLANS/RECOMMENDATIONS: 1. The patient should avoid evening sedatives, hypnotics,  and alcohol beverage consumption. 2. Weight loss is needed to reduce snoring. 3. The patient did not tolerate CPAP nor BiPAP well. A dental device should be considered if the patient has enough natural teeth to apply the device.   If not, will try CPAP at a setting of 6.6 cm water with the above named mask. The patient was fitted with a FFM after she could not tolerate nasal pillows. She used a [employer] Air touch F 20 in medium.    DISCUSSION: 1) CPAP auto titration device at 6 -9 cm water, 3 cm EPR - 2) or dental device, 3) encourage weight loss.   A follow up appointment [facility].   Please call [phone] with any questions.      I certify that I have reviewed the entire raw data recording prior to the issuance of this report in accordance with the Standards of Accreditation of the American Academy of Sleep Medicine (AASM)    Melvyn Novasarmen Astria Jordahl, M.D.    04-19-2018  Diplomat, American Board of Psychiatry and Neurology  Diplomat, American Board of Sleep Medicine Medical Director, [facility]

## 2018-04-24 ENCOUNTER — Telehealth: Payer: Self-pay

## 2018-04-24 NOTE — Telephone Encounter (Signed)
-----   Message from Melvyn Novasarmen Dohmeier, MD sent at 04/20/2018  2:49 PM EDT ----- There is no clear recommendation for treatment, 1)CPAP provoked apneas and hypopneas, did well at 6 and 7 cm water only. 2)The patient may not be a candidate for dental device  depending on dental status. 3) weight loss may be best approach.

## 2018-04-24 NOTE — Telephone Encounter (Signed)
I called pt to discuss her sleep study results. No answer, left a message asking her to call me back. 

## 2018-04-24 NOTE — Telephone Encounter (Signed)
[name], Rene KocherRegina, [facility], returned my call. I explained pt's sleep study results. [name] reports that the pt is working on weight loss and has lost about 10 lbs since living with her daughter. They will speak to pt's dentist regarding an oral appliance and let us know if they need a referral to a local dentist. [name] is interested in seeing Dr. Vickey Hugerohmeier as well, and I explained the referral process. [name] verbalized understanding and had no further questions at this time.

## 2018-05-22 DIAGNOSIS — E113292 Type 2 diabetes mellitus with mild nonproliferative diabetic retinopathy without macular edema, left eye: Secondary | ICD-10-CM | POA: Diagnosis not present

## 2018-05-22 DIAGNOSIS — H43822 Vitreomacular adhesion, left eye: Secondary | ICD-10-CM | POA: Diagnosis not present

## 2018-05-22 DIAGNOSIS — H4321 Crystalline deposits in vitreous body, right eye: Secondary | ICD-10-CM | POA: Diagnosis not present

## 2018-05-22 DIAGNOSIS — E113211 Type 2 diabetes mellitus with mild nonproliferative diabetic retinopathy with macular edema, right eye: Secondary | ICD-10-CM | POA: Diagnosis not present

## 2018-05-22 DIAGNOSIS — H2513 Age-related nuclear cataract, bilateral: Secondary | ICD-10-CM | POA: Diagnosis not present

## 2018-06-15 DIAGNOSIS — E1351 Other specified diabetes mellitus with diabetic peripheral angiopathy without gangrene: Secondary | ICD-10-CM | POA: Diagnosis not present

## 2018-06-15 DIAGNOSIS — M21961 Unspecified acquired deformity of right lower leg: Secondary | ICD-10-CM | POA: Diagnosis not present

## 2018-06-15 DIAGNOSIS — B353 Tinea pedis: Secondary | ICD-10-CM | POA: Diagnosis not present

## 2018-06-15 DIAGNOSIS — M21962 Unspecified acquired deformity of left lower leg: Secondary | ICD-10-CM | POA: Diagnosis not present

## 2018-06-15 DIAGNOSIS — L602 Onychogryphosis: Secondary | ICD-10-CM | POA: Diagnosis not present

## 2018-06-20 DIAGNOSIS — H2513 Age-related nuclear cataract, bilateral: Secondary | ICD-10-CM | POA: Diagnosis not present

## 2018-06-20 DIAGNOSIS — H524 Presbyopia: Secondary | ICD-10-CM | POA: Diagnosis not present

## 2018-06-20 DIAGNOSIS — H25013 Cortical age-related cataract, bilateral: Secondary | ICD-10-CM | POA: Diagnosis not present

## 2018-06-26 DIAGNOSIS — R030 Elevated blood-pressure reading, without diagnosis of hypertension: Secondary | ICD-10-CM | POA: Diagnosis not present

## 2018-06-26 DIAGNOSIS — M4712 Other spondylosis with myelopathy, cervical region: Secondary | ICD-10-CM | POA: Diagnosis not present

## 2018-06-26 DIAGNOSIS — M5412 Radiculopathy, cervical region: Secondary | ICD-10-CM | POA: Diagnosis not present

## 2018-06-26 DIAGNOSIS — Z6833 Body mass index (BMI) 33.0-33.9, adult: Secondary | ICD-10-CM | POA: Diagnosis not present

## 2018-07-03 ENCOUNTER — Other Ambulatory Visit: Payer: Self-pay | Admitting: Internal Medicine

## 2018-07-03 DIAGNOSIS — Z23 Encounter for immunization: Secondary | ICD-10-CM | POA: Diagnosis not present

## 2018-07-03 DIAGNOSIS — K219 Gastro-esophageal reflux disease without esophagitis: Secondary | ICD-10-CM | POA: Diagnosis not present

## 2018-07-03 DIAGNOSIS — Z1231 Encounter for screening mammogram for malignant neoplasm of breast: Secondary | ICD-10-CM

## 2018-07-03 DIAGNOSIS — K5909 Other constipation: Secondary | ICD-10-CM | POA: Diagnosis not present

## 2018-07-03 DIAGNOSIS — I10 Essential (primary) hypertension: Secondary | ICD-10-CM | POA: Diagnosis not present

## 2018-07-03 DIAGNOSIS — G4733 Obstructive sleep apnea (adult) (pediatric): Secondary | ICD-10-CM | POA: Diagnosis not present

## 2018-07-03 DIAGNOSIS — Z6834 Body mass index (BMI) 34.0-34.9, adult: Secondary | ICD-10-CM | POA: Diagnosis not present

## 2018-07-03 DIAGNOSIS — D6489 Other specified anemias: Secondary | ICD-10-CM | POA: Diagnosis not present

## 2018-07-03 DIAGNOSIS — E1169 Type 2 diabetes mellitus with other specified complication: Secondary | ICD-10-CM | POA: Diagnosis not present

## 2018-07-17 DIAGNOSIS — H5789 Other specified disorders of eye and adnexa: Secondary | ICD-10-CM | POA: Diagnosis not present

## 2018-07-17 DIAGNOSIS — H2511 Age-related nuclear cataract, right eye: Secondary | ICD-10-CM | POA: Diagnosis not present

## 2018-07-17 DIAGNOSIS — H25811 Combined forms of age-related cataract, right eye: Secondary | ICD-10-CM | POA: Diagnosis not present

## 2018-07-17 DIAGNOSIS — H25011 Cortical age-related cataract, right eye: Secondary | ICD-10-CM | POA: Diagnosis not present

## 2018-08-03 ENCOUNTER — Ambulatory Visit: Payer: Medicare Other

## 2018-08-09 ENCOUNTER — Ambulatory Visit
Admission: RE | Admit: 2018-08-09 | Discharge: 2018-08-09 | Disposition: A | Discharge status: Home / Self Care | Payer: Medicare Other | Source: Ambulatory Visit | Attending: Internal Medicine | Admitting: Internal Medicine

## 2018-08-09 DIAGNOSIS — Z1231 Encounter for screening mammogram for malignant neoplasm of breast: Secondary | ICD-10-CM

## 2018-08-30 DIAGNOSIS — H59031 Cystoid macular edema following cataract surgery, right eye: Secondary | ICD-10-CM | POA: Diagnosis not present

## 2018-08-30 DIAGNOSIS — H43822 Vitreomacular adhesion, left eye: Secondary | ICD-10-CM | POA: Diagnosis not present

## 2018-08-30 DIAGNOSIS — E113311 Type 2 diabetes mellitus with moderate nonproliferative diabetic retinopathy with macular edema, right eye: Secondary | ICD-10-CM | POA: Diagnosis not present

## 2018-08-30 DIAGNOSIS — H43821 Vitreomacular adhesion, right eye: Secondary | ICD-10-CM | POA: Diagnosis not present

## 2018-08-30 DIAGNOSIS — E1351 Other specified diabetes mellitus with diabetic peripheral angiopathy without gangrene: Secondary | ICD-10-CM | POA: Diagnosis not present

## 2018-08-30 DIAGNOSIS — L602 Onychogryphosis: Secondary | ICD-10-CM | POA: Diagnosis not present

## 2018-08-30 DIAGNOSIS — H4321 Crystalline deposits in vitreous body, right eye: Secondary | ICD-10-CM | POA: Diagnosis not present

## 2018-08-30 DIAGNOSIS — E113392 Type 2 diabetes mellitus with moderate nonproliferative diabetic retinopathy without macular edema, left eye: Secondary | ICD-10-CM | POA: Diagnosis not present

## 2018-09-10 DIAGNOSIS — H4321 Crystalline deposits in vitreous body, right eye: Secondary | ICD-10-CM | POA: Diagnosis not present

## 2018-09-10 DIAGNOSIS — M25551 Pain in right hip: Secondary | ICD-10-CM | POA: Diagnosis not present

## 2018-09-10 DIAGNOSIS — H59031 Cystoid macular edema following cataract surgery, right eye: Secondary | ICD-10-CM | POA: Diagnosis not present

## 2018-09-10 DIAGNOSIS — E113311 Type 2 diabetes mellitus with moderate nonproliferative diabetic retinopathy with macular edema, right eye: Secondary | ICD-10-CM | POA: Diagnosis not present

## 2018-09-10 DIAGNOSIS — Z6834 Body mass index (BMI) 34.0-34.9, adult: Secondary | ICD-10-CM | POA: Diagnosis not present

## 2018-09-10 DIAGNOSIS — E113392 Type 2 diabetes mellitus with moderate nonproliferative diabetic retinopathy without macular edema, left eye: Secondary | ICD-10-CM | POA: Diagnosis not present

## 2018-09-12 ENCOUNTER — Emergency Department (HOSPITAL_COMMUNITY): Payer: Medicare Other

## 2018-09-12 ENCOUNTER — Encounter (HOSPITAL_COMMUNITY): Payer: Self-pay | Admitting: Emergency Medicine

## 2018-09-12 ENCOUNTER — Emergency Department (HOSPITAL_COMMUNITY)
Admission: EM | Admit: 2018-09-12 | Discharge: 2018-09-12 | Disposition: A | Discharge status: Home / Self Care | Payer: Medicare Other | Attending: Emergency Medicine | Admitting: Emergency Medicine

## 2018-09-12 DIAGNOSIS — I10 Essential (primary) hypertension: Secondary | ICD-10-CM | POA: Insufficient documentation

## 2018-09-12 DIAGNOSIS — Y939 Activity, unspecified: Secondary | ICD-10-CM | POA: Diagnosis not present

## 2018-09-12 DIAGNOSIS — W19XXXA Unspecified fall, initial encounter: Secondary | ICD-10-CM

## 2018-09-12 DIAGNOSIS — S0101XA Laceration without foreign body of scalp, initial encounter: Secondary | ICD-10-CM | POA: Diagnosis not present

## 2018-09-12 DIAGNOSIS — Y929 Unspecified place or not applicable: Secondary | ICD-10-CM | POA: Diagnosis not present

## 2018-09-12 DIAGNOSIS — W010XXA Fall on same level from slipping, tripping and stumbling without subsequent striking against object, initial encounter: Secondary | ICD-10-CM | POA: Insufficient documentation

## 2018-09-12 DIAGNOSIS — S199XXA Unspecified injury of neck, initial encounter: Secondary | ICD-10-CM | POA: Diagnosis not present

## 2018-09-12 DIAGNOSIS — S0990XA Unspecified injury of head, initial encounter: Secondary | ICD-10-CM | POA: Diagnosis not present

## 2018-09-12 DIAGNOSIS — Y999 Unspecified external cause status: Secondary | ICD-10-CM | POA: Diagnosis not present

## 2018-09-12 DIAGNOSIS — I4510 Unspecified right bundle-branch block: Secondary | ICD-10-CM | POA: Diagnosis not present

## 2018-09-12 DIAGNOSIS — R404 Transient alteration of awareness: Secondary | ICD-10-CM | POA: Diagnosis not present

## 2018-09-12 DIAGNOSIS — R110 Nausea: Secondary | ICD-10-CM | POA: Diagnosis not present

## 2018-09-12 DIAGNOSIS — M25551 Pain in right hip: Secondary | ICD-10-CM | POA: Diagnosis not present

## 2018-09-12 DIAGNOSIS — E119 Type 2 diabetes mellitus without complications: Secondary | ICD-10-CM | POA: Insufficient documentation

## 2018-09-12 DIAGNOSIS — M5136 Other intervertebral disc degeneration, lumbar region: Secondary | ICD-10-CM | POA: Diagnosis not present

## 2018-09-12 MED ORDER — LIDOCAINE-EPINEPHRINE (PF) 2 %-1:200000 IJ SOLN
10.0000 mL | Freq: Once | INTRAMUSCULAR | Status: AC
  Administered 2018-09-12: 10 mL
  Filled 2018-09-12: qty 10

## 2018-09-12 NOTE — ED Provider Notes (Signed)
[facility] Emergency Department Provider Note MRN:  [id number]  Arrival date & time: 09/12/18     Chief Complaint   Fall   History of Present Illness   [name] Mathews is a 84 y.o. year-old female with a history of hypertension, diabetes presenting to the ED with chief complaint of fall.  About 1 hour prior to arrival, patient was getting out of the backseat of a car and lost her balance and fell, striking the left side of her head against the tire.  Family explains that she was acting her normal self throughout the day today and seems to have just lost her balance.  Very abnormal for her.  No loss of consciousness, denies significant neck or back pain, no chest pain or shortness of breath, no abdominal pain, no numbness or weakness in the arms or legs.  Takes a daily baby aspirin but no other blood thinners.  Review of Systems  A complete 10 system review of systems was obtained and all systems are negative except as noted in the HPI and PMH.   Patient's Health History    Past Medical History:  Diagnosis Date  . Anemia    hx of  . Anxiety   . Arthritis   . Breast lump    hx of  . Cataract    left eye  . Chronic back pain   . Diabetes mellitus   . Hyperlipidemia   . Hypertension    [facility] Dr[facility]  . Normochromic normocytic anemia 05/22/2017   Hb 10 MCV 85.6 No monoclonal spike on SPEP  Creatinine 1.3-1.6  . Peripheral vascular disease [facility](HCC)     Past Surgical History:  Procedure Laterality Date  . APPENDECTOMY    . BACK SURGERY    . BREAST CYST EXCISION Left   . BREAST SURGERY     cyst removed left side  . BUNIONECTOMY    . CARDIOVASCULAR STRESS TEST    . COLONOSCOPY    . DILATION AND CURETTAGE OF UTERUS    . TONSILLECTOMY      History reviewed. No pertinent family history.  Social History   Socioeconomic History  . Marital status: Widowed    Spouse name: Not on file  . Number of children: Not on file    . Years of education: Not on file  . Highest education level: Not on file  Occupational History  . Not on file  Social Needs  . Financial resource strain: Not on file  . Food insecurity:    Worry: Not on file    Inability: Not on file  . Transportation needs:    Medical: Not on file    Non-medical: Not on file  Tobacco Use  . Smoking status: Never Smoker  . Smokeless tobacco: Never Used  Substance and Sexual Activity  . Alcohol use: No  . Drug use: No  . Sexual activity: Not on file  Lifestyle  . Physical activity:    Days per week: Not on file    Minutes per session: Not on file  . Stress: Not on file  Relationships  . Social connections:    Talks on phone: Not on file    Gets together: Not on file    Attends religious service: Not on file    Active member of club or organization: Not on file    Attends meetings of clubs or organizations: Not on file    Relationship status: Not on file  .  Intimate partner violence:    Fear of current or ex partner: Not on file    Emotionally abused: Not on file    Physically abused: Not on file    Forced sexual activity: Not on file  Other Topics Concern  . Not on file  Social History Narrative  . Not on file     Physical Exam  Vital Signs and Nursing Notes reviewed Vitals:   09/12/18 1800 09/12/18 1845  BP: (!) 147/65 (!) 152/75  Pulse: 73 77  Resp: 20 15  SpO2: 97% 95%    CONSTITUTIONAL: Well-appearing, NAD NEURO:  Alert and oriented x 3, no focal deficits EYES:  eyes equal and reactive ENT/NECK:  no LAD, no JVD CARDIO: Regular rate, well-perfused, normal S1 and S2 PULM:  CTAB no wheezing or rhonchi GI/GU:  normal bowel sounds, non-distended, non-tender MSK/SPINE:  No gross deformities, no edema SKIN:  no rash, 3 cm laceration to the left temporal scalp PSYCH:  Appropriate speech and behavior  Diagnostic and Interventional Summary    EKG Interpretation  Date/Time:  Wednesday September 12 2018 17:13:59  EST Ventricular Rate:  77 PR Interval:    QRS Duration: 132 QT Interval:  444 QTC Calculation: 503 R Axis:   0 Text Interpretation:  Sinus rhythm Right bundle branch block Confirmed by Kennis Carina [phone]) on 09/12/2018 5:29:24 PM      Labs Reviewed  URINALYSIS, ROUTINE W REFLEX MICROSCOPIC    CT HEAD WO CONTRAST  Final Result    CT Cervical Spine Wo Contrast  Final Result      Medications  lidocaine-EPINEPHrine (XYLOCAINE W/EPI) 2 %-1:200000 (PF) injection 10 mL (has no administration in time range)     .Marland KitchenLaceration Repair Date/Time: 09/12/2018 8:18 PM Performed by: Sabas Sous, MD Authorized by: Sabas Sous, MD   Consent:    Consent obtained:  Verbal   Consent given by:  Patient Anesthesia (see MAR for exact dosages):    Anesthesia method:  Local infiltration   Local anesthetic:  Lidocaine 2% WITH epi Laceration details:    Location:  Scalp   Scalp location:  L temporal Repair type:    Repair type:  Simple Exploration:    Contaminated: no   Treatment:    Area cleansed with:  Saline   Amount of cleaning:  Standard Skin repair:    Repair method:  Sutures   Suture size:  4-0   Suture material:  Prolene   Number of sutures:  6 Approximation:    Approximation:  Close Post-procedure details:    Dressing:  Antibiotic ointment   Patient tolerance of procedure:  Tolerated well, no immediate complications     Critical Care  ED Course and Medical Decision Making  I have reviewed the triage vital signs and the nursing notes.  Pertinent labs & imaging results that were available during my care of the patient were reviewed by me and considered in my medical decision making (see below for details).  Mechanical ground-level fall with head trauma and retrograde amnesia in this 84 year old female.  CT is pending.  CT with no acute process.  Laceration repaired as described above.  Given precautions for possible concussion.  Patient was able to ambulate  here in the ED without issue.  Will be observed closely by family at home.  After the discussed management above, the patient was determined to be safe for discharge.  The patient was in agreement with this plan and all questions regarding their care were answered.  ED return precautions were discussed and the patient will return to the ED with any significant worsening of condition.  Elmer Sow. Pilar Plate, MD [facility] Emergency Medicine [facility] [email or web address]  Final Clinical Impressions(s) / ED Diagnoses     ICD-10-CM   1. Fall, initial encounter W19.XXXA   2. Laceration of scalp, initial encounter S01.[email or web address]     ED Discharge Orders    None         Sabas Sous, MD 09/12/18 2020

## 2018-09-12 NOTE — ED Notes (Signed)
Patient verbalizes understanding of discharge instructions. Opportunity for questioning and answers were provided. Armband removed by staff, pt discharged from ED via wheelchair w/ family   

## 2018-09-12 NOTE — Discharge Instructions (Addendum)
You were evaluated in the Emergency Department and after careful evaluation, we did not find any emergent condition requiring admission or further testing in the hospital.  Your symptoms today seem to be due to a laceration to the scalp.  It is possible that you sustained a concussion.  Please practice mental and physical rest at home as discussed.  Keep an eye out for nausea, vomiting, dizziness.  Please return to the Emergency Department if you experience any worsening of your condition.  We encourage you to follow up with a primary care provider.  Thank you for allowing us to be a part of your care.

## 2018-09-12 NOTE — ED Triage Notes (Signed)
Pt here after falling getting out of ar hitting her head on the wheel , pt confused afterwards and received 4 of zofran for nausea

## 2018-09-12 NOTE — ED Notes (Signed)
Patient transported to CT 

## 2018-09-12 NOTE — ED Notes (Signed)
Pt more awake now talking with family

## 2018-09-18 DIAGNOSIS — M79604 Pain in right leg: Secondary | ICD-10-CM | POA: Diagnosis not present

## 2018-09-18 DIAGNOSIS — Z6833 Body mass index (BMI) 33.0-33.9, adult: Secondary | ICD-10-CM | POA: Diagnosis not present

## 2018-09-18 DIAGNOSIS — I10 Essential (primary) hypertension: Secondary | ICD-10-CM | POA: Diagnosis not present

## 2018-09-19 DIAGNOSIS — R300 Dysuria: Secondary | ICD-10-CM | POA: Diagnosis not present

## 2018-09-21 DIAGNOSIS — S0191XA Laceration without foreign body of unspecified part of head, initial encounter: Secondary | ICD-10-CM | POA: Diagnosis not present

## 2018-09-21 DIAGNOSIS — Z4802 Encounter for removal of sutures: Secondary | ICD-10-CM | POA: Diagnosis not present

## 2018-09-21 DIAGNOSIS — D6489 Other specified anemias: Secondary | ICD-10-CM | POA: Diagnosis not present

## 2018-09-21 DIAGNOSIS — M5136 Other intervertebral disc degeneration, lumbar region: Secondary | ICD-10-CM | POA: Diagnosis not present

## 2018-09-21 DIAGNOSIS — R300 Dysuria: Secondary | ICD-10-CM | POA: Diagnosis not present

## 2018-10-04 DIAGNOSIS — M4316 Spondylolisthesis, lumbar region: Secondary | ICD-10-CM | POA: Diagnosis not present

## 2018-10-04 DIAGNOSIS — I10 Essential (primary) hypertension: Secondary | ICD-10-CM | POA: Diagnosis not present

## 2018-10-04 DIAGNOSIS — M545 Low back pain: Secondary | ICD-10-CM | POA: Diagnosis not present

## 2018-10-04 DIAGNOSIS — S3992XA Unspecified injury of lower back, initial encounter: Secondary | ICD-10-CM | POA: Diagnosis not present

## 2018-10-15 DIAGNOSIS — H59031 Cystoid macular edema following cataract surgery, right eye: Secondary | ICD-10-CM | POA: Diagnosis not present

## 2018-10-15 DIAGNOSIS — E113311 Type 2 diabetes mellitus with moderate nonproliferative diabetic retinopathy with macular edema, right eye: Secondary | ICD-10-CM | POA: Diagnosis not present

## 2018-10-15 DIAGNOSIS — E113392 Type 2 diabetes mellitus with moderate nonproliferative diabetic retinopathy without macular edema, left eye: Secondary | ICD-10-CM | POA: Diagnosis not present

## 2018-10-15 DIAGNOSIS — H4321 Crystalline deposits in vitreous body, right eye: Secondary | ICD-10-CM | POA: Diagnosis not present

## 2018-10-22 DIAGNOSIS — M5136 Other intervertebral disc degeneration, lumbar region: Secondary | ICD-10-CM | POA: Diagnosis not present

## 2018-10-22 DIAGNOSIS — M5416 Radiculopathy, lumbar region: Secondary | ICD-10-CM | POA: Diagnosis not present

## 2018-10-22 DIAGNOSIS — M5126 Other intervertebral disc displacement, lumbar region: Secondary | ICD-10-CM | POA: Diagnosis not present

## 2018-10-26 DIAGNOSIS — M5136 Other intervertebral disc degeneration, lumbar region: Secondary | ICD-10-CM | POA: Diagnosis not present

## 2018-10-26 DIAGNOSIS — M5126 Other intervertebral disc displacement, lumbar region: Secondary | ICD-10-CM | POA: Diagnosis not present

## 2018-10-26 DIAGNOSIS — M5416 Radiculopathy, lumbar region: Secondary | ICD-10-CM | POA: Diagnosis not present

## 2018-11-08 DIAGNOSIS — I10 Essential (primary) hypertension: Secondary | ICD-10-CM | POA: Diagnosis not present

## 2018-11-08 DIAGNOSIS — R413 Other amnesia: Secondary | ICD-10-CM | POA: Diagnosis not present

## 2018-11-08 DIAGNOSIS — M5136 Other intervertebral disc degeneration, lumbar region: Secondary | ICD-10-CM | POA: Diagnosis not present

## 2018-11-08 DIAGNOSIS — D649 Anemia, unspecified: Secondary | ICD-10-CM | POA: Diagnosis not present

## 2018-11-08 DIAGNOSIS — M25551 Pain in right hip: Secondary | ICD-10-CM | POA: Diagnosis not present

## 2018-11-08 DIAGNOSIS — Z6833 Body mass index (BMI) 33.0-33.9, adult: Secondary | ICD-10-CM | POA: Diagnosis not present

## 2018-11-08 DIAGNOSIS — E1169 Type 2 diabetes mellitus with other specified complication: Secondary | ICD-10-CM | POA: Diagnosis not present

## 2018-11-12 DIAGNOSIS — R82998 Other abnormal findings in urine: Secondary | ICD-10-CM | POA: Diagnosis not present

## 2018-11-12 DIAGNOSIS — E1169 Type 2 diabetes mellitus with other specified complication: Secondary | ICD-10-CM | POA: Diagnosis not present

## 2018-11-12 DIAGNOSIS — N390 Urinary tract infection, site not specified: Secondary | ICD-10-CM | POA: Diagnosis not present

## 2018-11-16 DIAGNOSIS — L84 Corns and callosities: Secondary | ICD-10-CM | POA: Diagnosis not present

## 2018-11-16 DIAGNOSIS — E1351 Other specified diabetes mellitus with diabetic peripheral angiopathy without gangrene: Secondary | ICD-10-CM | POA: Diagnosis not present

## 2018-11-16 DIAGNOSIS — L602 Onychogryphosis: Secondary | ICD-10-CM | POA: Diagnosis not present

## 2018-11-20 DIAGNOSIS — M5126 Other intervertebral disc displacement, lumbar region: Secondary | ICD-10-CM | POA: Diagnosis not present

## 2018-11-20 DIAGNOSIS — M5136 Other intervertebral disc degeneration, lumbar region: Secondary | ICD-10-CM | POA: Diagnosis not present

## 2018-11-20 DIAGNOSIS — M5416 Radiculopathy, lumbar region: Secondary | ICD-10-CM | POA: Diagnosis not present

## 2018-11-22 DIAGNOSIS — H43821 Vitreomacular adhesion, right eye: Secondary | ICD-10-CM | POA: Diagnosis not present

## 2018-11-22 DIAGNOSIS — E113392 Type 2 diabetes mellitus with moderate nonproliferative diabetic retinopathy without macular edema, left eye: Secondary | ICD-10-CM | POA: Diagnosis not present

## 2018-11-22 DIAGNOSIS — E113311 Type 2 diabetes mellitus with moderate nonproliferative diabetic retinopathy with macular edema, right eye: Secondary | ICD-10-CM | POA: Diagnosis not present

## 2018-12-07 DIAGNOSIS — M5126 Other intervertebral disc displacement, lumbar region: Secondary | ICD-10-CM | POA: Diagnosis not present

## 2018-12-07 DIAGNOSIS — M5136 Other intervertebral disc degeneration, lumbar region: Secondary | ICD-10-CM | POA: Diagnosis not present

## 2018-12-14 DIAGNOSIS — Z961 Presence of intraocular lens: Secondary | ICD-10-CM | POA: Diagnosis not present

## 2018-12-14 DIAGNOSIS — H35351 Cystoid macular degeneration, right eye: Secondary | ICD-10-CM | POA: Diagnosis not present

## 2018-12-17 DIAGNOSIS — R82998 Other abnormal findings in urine: Secondary | ICD-10-CM | POA: Diagnosis not present

## 2018-12-17 DIAGNOSIS — E7849 Other hyperlipidemia: Secondary | ICD-10-CM | POA: Diagnosis not present

## 2018-12-17 DIAGNOSIS — E1169 Type 2 diabetes mellitus with other specified complication: Secondary | ICD-10-CM | POA: Diagnosis not present

## 2018-12-17 DIAGNOSIS — M859 Disorder of bone density and structure, unspecified: Secondary | ICD-10-CM | POA: Diagnosis not present

## 2018-12-24 DIAGNOSIS — E1169 Type 2 diabetes mellitus with other specified complication: Secondary | ICD-10-CM | POA: Diagnosis not present

## 2018-12-24 DIAGNOSIS — D6489 Other specified anemias: Secondary | ICD-10-CM | POA: Diagnosis not present

## 2018-12-24 DIAGNOSIS — R808 Other proteinuria: Secondary | ICD-10-CM | POA: Diagnosis not present

## 2018-12-24 DIAGNOSIS — D890 Polyclonal hypergammaglobulinemia: Secondary | ICD-10-CM | POA: Diagnosis not present

## 2018-12-24 DIAGNOSIS — Z6833 Body mass index (BMI) 33.0-33.9, adult: Secondary | ICD-10-CM | POA: Diagnosis not present

## 2018-12-24 DIAGNOSIS — K219 Gastro-esophageal reflux disease without esophagitis: Secondary | ICD-10-CM | POA: Diagnosis not present

## 2018-12-24 DIAGNOSIS — G4733 Obstructive sleep apnea (adult) (pediatric): Secondary | ICD-10-CM | POA: Diagnosis not present

## 2018-12-24 DIAGNOSIS — R413 Other amnesia: Secondary | ICD-10-CM | POA: Diagnosis not present

## 2018-12-24 DIAGNOSIS — Z1331 Encounter for screening for depression: Secondary | ICD-10-CM | POA: Diagnosis not present

## 2018-12-24 DIAGNOSIS — Z0000 Encounter for general adult medical examination without abnormal findings: Secondary | ICD-10-CM | POA: Diagnosis not present

## 2018-12-24 DIAGNOSIS — M25551 Pain in right hip: Secondary | ICD-10-CM | POA: Diagnosis not present

## 2018-12-28 DIAGNOSIS — M9983 Other biomechanical lesions of lumbar region: Secondary | ICD-10-CM | POA: Diagnosis not present

## 2018-12-28 DIAGNOSIS — M48062 Spinal stenosis, lumbar region with neurogenic claudication: Secondary | ICD-10-CM | POA: Diagnosis not present

## 2018-12-28 DIAGNOSIS — M4316 Spondylolisthesis, lumbar region: Secondary | ICD-10-CM | POA: Diagnosis not present

## 2018-12-28 DIAGNOSIS — I10 Essential (primary) hypertension: Secondary | ICD-10-CM | POA: Diagnosis not present

## 2018-12-28 DIAGNOSIS — Z6833 Body mass index (BMI) 33.0-33.9, adult: Secondary | ICD-10-CM | POA: Diagnosis not present

## 2019-01-03 DIAGNOSIS — M5126 Other intervertebral disc displacement, lumbar region: Secondary | ICD-10-CM | POA: Diagnosis not present

## 2019-01-03 DIAGNOSIS — M5416 Radiculopathy, lumbar region: Secondary | ICD-10-CM | POA: Diagnosis not present

## 2019-01-04 DIAGNOSIS — E113311 Type 2 diabetes mellitus with moderate nonproliferative diabetic retinopathy with macular edema, right eye: Secondary | ICD-10-CM | POA: Diagnosis not present

## 2019-01-04 DIAGNOSIS — H43821 Vitreomacular adhesion, right eye: Secondary | ICD-10-CM | POA: Diagnosis not present

## 2019-01-04 DIAGNOSIS — H59031 Cystoid macular edema following cataract surgery, right eye: Secondary | ICD-10-CM | POA: Diagnosis not present

## 2019-01-04 DIAGNOSIS — H3561 Retinal hemorrhage, right eye: Secondary | ICD-10-CM | POA: Diagnosis not present

## 2019-01-24 DIAGNOSIS — L602 Onychogryphosis: Secondary | ICD-10-CM | POA: Diagnosis not present

## 2019-01-24 DIAGNOSIS — L84 Corns and callosities: Secondary | ICD-10-CM | POA: Diagnosis not present

## 2019-01-24 DIAGNOSIS — E1351 Other specified diabetes mellitus with diabetic peripheral angiopathy without gangrene: Secondary | ICD-10-CM | POA: Diagnosis not present

## 2019-02-04 DIAGNOSIS — H43821 Vitreomacular adhesion, right eye: Secondary | ICD-10-CM | POA: Diagnosis not present

## 2019-02-04 DIAGNOSIS — H4321 Crystalline deposits in vitreous body, right eye: Secondary | ICD-10-CM | POA: Diagnosis not present

## 2019-02-04 DIAGNOSIS — E113311 Type 2 diabetes mellitus with moderate nonproliferative diabetic retinopathy with macular edema, right eye: Secondary | ICD-10-CM | POA: Diagnosis not present

## 2019-02-04 DIAGNOSIS — H59031 Cystoid macular edema following cataract surgery, right eye: Secondary | ICD-10-CM | POA: Diagnosis not present

## 2019-02-25 DIAGNOSIS — M4316 Spondylolisthesis, lumbar region: Secondary | ICD-10-CM | POA: Diagnosis not present

## 2019-02-25 DIAGNOSIS — M48062 Spinal stenosis, lumbar region with neurogenic claudication: Secondary | ICD-10-CM | POA: Diagnosis not present

## 2019-02-25 DIAGNOSIS — Z7982 Long term (current) use of aspirin: Secondary | ICD-10-CM | POA: Diagnosis not present

## 2019-02-25 DIAGNOSIS — Z7984 Long term (current) use of oral hypoglycemic drugs: Secondary | ICD-10-CM | POA: Diagnosis not present

## 2019-02-25 DIAGNOSIS — Z981 Arthrodesis status: Secondary | ICD-10-CM | POA: Diagnosis not present

## 2019-02-28 DIAGNOSIS — Z7984 Long term (current) use of oral hypoglycemic drugs: Secondary | ICD-10-CM | POA: Diagnosis not present

## 2019-02-28 DIAGNOSIS — Z7982 Long term (current) use of aspirin: Secondary | ICD-10-CM | POA: Diagnosis not present

## 2019-02-28 DIAGNOSIS — M48062 Spinal stenosis, lumbar region with neurogenic claudication: Secondary | ICD-10-CM | POA: Diagnosis not present

## 2019-02-28 DIAGNOSIS — Z981 Arthrodesis status: Secondary | ICD-10-CM | POA: Diagnosis not present

## 2019-02-28 DIAGNOSIS — M4316 Spondylolisthesis, lumbar region: Secondary | ICD-10-CM | POA: Diagnosis not present

## 2019-03-04 DIAGNOSIS — Z7984 Long term (current) use of oral hypoglycemic drugs: Secondary | ICD-10-CM | POA: Diagnosis not present

## 2019-03-04 DIAGNOSIS — M5136 Other intervertebral disc degeneration, lumbar region: Secondary | ICD-10-CM | POA: Diagnosis not present

## 2019-03-04 DIAGNOSIS — Z7982 Long term (current) use of aspirin: Secondary | ICD-10-CM | POA: Diagnosis not present

## 2019-03-04 DIAGNOSIS — Z981 Arthrodesis status: Secondary | ICD-10-CM | POA: Diagnosis not present

## 2019-03-04 DIAGNOSIS — M5126 Other intervertebral disc displacement, lumbar region: Secondary | ICD-10-CM | POA: Diagnosis not present

## 2019-03-04 DIAGNOSIS — M48062 Spinal stenosis, lumbar region with neurogenic claudication: Secondary | ICD-10-CM | POA: Diagnosis not present

## 2019-03-04 DIAGNOSIS — M5416 Radiculopathy, lumbar region: Secondary | ICD-10-CM | POA: Diagnosis not present

## 2019-03-04 DIAGNOSIS — M4316 Spondylolisthesis, lumbar region: Secondary | ICD-10-CM | POA: Diagnosis not present

## 2019-03-05 DIAGNOSIS — M4316 Spondylolisthesis, lumbar region: Secondary | ICD-10-CM | POA: Diagnosis not present

## 2019-03-05 DIAGNOSIS — Z7982 Long term (current) use of aspirin: Secondary | ICD-10-CM | POA: Diagnosis not present

## 2019-03-05 DIAGNOSIS — Z7984 Long term (current) use of oral hypoglycemic drugs: Secondary | ICD-10-CM | POA: Diagnosis not present

## 2019-03-05 DIAGNOSIS — M48062 Spinal stenosis, lumbar region with neurogenic claudication: Secondary | ICD-10-CM | POA: Diagnosis not present

## 2019-03-05 DIAGNOSIS — Z981 Arthrodesis status: Secondary | ICD-10-CM | POA: Diagnosis not present

## 2019-03-06 DIAGNOSIS — Z7984 Long term (current) use of oral hypoglycemic drugs: Secondary | ICD-10-CM | POA: Diagnosis not present

## 2019-03-06 DIAGNOSIS — M4316 Spondylolisthesis, lumbar region: Secondary | ICD-10-CM | POA: Diagnosis not present

## 2019-03-06 DIAGNOSIS — M48062 Spinal stenosis, lumbar region with neurogenic claudication: Secondary | ICD-10-CM | POA: Diagnosis not present

## 2019-03-06 DIAGNOSIS — Z981 Arthrodesis status: Secondary | ICD-10-CM | POA: Diagnosis not present

## 2019-03-06 DIAGNOSIS — Z7982 Long term (current) use of aspirin: Secondary | ICD-10-CM | POA: Diagnosis not present

## 2019-03-11 DIAGNOSIS — Z7984 Long term (current) use of oral hypoglycemic drugs: Secondary | ICD-10-CM | POA: Diagnosis not present

## 2019-03-11 DIAGNOSIS — Z981 Arthrodesis status: Secondary | ICD-10-CM | POA: Diagnosis not present

## 2019-03-11 DIAGNOSIS — Z7982 Long term (current) use of aspirin: Secondary | ICD-10-CM | POA: Diagnosis not present

## 2019-03-11 DIAGNOSIS — M48062 Spinal stenosis, lumbar region with neurogenic claudication: Secondary | ICD-10-CM | POA: Diagnosis not present

## 2019-03-11 DIAGNOSIS — M4316 Spondylolisthesis, lumbar region: Secondary | ICD-10-CM | POA: Diagnosis not present

## 2019-03-13 DIAGNOSIS — Z7982 Long term (current) use of aspirin: Secondary | ICD-10-CM | POA: Diagnosis not present

## 2019-03-13 DIAGNOSIS — M48062 Spinal stenosis, lumbar region with neurogenic claudication: Secondary | ICD-10-CM | POA: Diagnosis not present

## 2019-03-13 DIAGNOSIS — Z7984 Long term (current) use of oral hypoglycemic drugs: Secondary | ICD-10-CM | POA: Diagnosis not present

## 2019-03-13 DIAGNOSIS — Z981 Arthrodesis status: Secondary | ICD-10-CM | POA: Diagnosis not present

## 2019-03-13 DIAGNOSIS — M4316 Spondylolisthesis, lumbar region: Secondary | ICD-10-CM | POA: Diagnosis not present

## 2019-03-14 DIAGNOSIS — M5136 Other intervertebral disc degeneration, lumbar region: Secondary | ICD-10-CM | POA: Diagnosis not present

## 2019-03-14 DIAGNOSIS — M5416 Radiculopathy, lumbar region: Secondary | ICD-10-CM | POA: Diagnosis not present

## 2019-03-18 DIAGNOSIS — M4316 Spondylolisthesis, lumbar region: Secondary | ICD-10-CM | POA: Diagnosis not present

## 2019-03-18 DIAGNOSIS — Z7984 Long term (current) use of oral hypoglycemic drugs: Secondary | ICD-10-CM | POA: Diagnosis not present

## 2019-03-18 DIAGNOSIS — M48062 Spinal stenosis, lumbar region with neurogenic claudication: Secondary | ICD-10-CM | POA: Diagnosis not present

## 2019-03-18 DIAGNOSIS — Z7982 Long term (current) use of aspirin: Secondary | ICD-10-CM | POA: Diagnosis not present

## 2019-03-18 DIAGNOSIS — Z981 Arthrodesis status: Secondary | ICD-10-CM | POA: Diagnosis not present

## 2019-03-19 DIAGNOSIS — R300 Dysuria: Secondary | ICD-10-CM | POA: Diagnosis not present

## 2019-03-19 DIAGNOSIS — N390 Urinary tract infection, site not specified: Secondary | ICD-10-CM | POA: Diagnosis not present

## 2019-03-20 DIAGNOSIS — Z7982 Long term (current) use of aspirin: Secondary | ICD-10-CM | POA: Diagnosis not present

## 2019-03-20 DIAGNOSIS — M48062 Spinal stenosis, lumbar region with neurogenic claudication: Secondary | ICD-10-CM | POA: Diagnosis not present

## 2019-03-20 DIAGNOSIS — M4316 Spondylolisthesis, lumbar region: Secondary | ICD-10-CM | POA: Diagnosis not present

## 2019-03-20 DIAGNOSIS — Z7984 Long term (current) use of oral hypoglycemic drugs: Secondary | ICD-10-CM | POA: Diagnosis not present

## 2019-03-20 DIAGNOSIS — Z981 Arthrodesis status: Secondary | ICD-10-CM | POA: Diagnosis not present

## 2019-03-25 DIAGNOSIS — M48062 Spinal stenosis, lumbar region with neurogenic claudication: Secondary | ICD-10-CM | POA: Diagnosis not present

## 2019-03-25 DIAGNOSIS — Z7982 Long term (current) use of aspirin: Secondary | ICD-10-CM | POA: Diagnosis not present

## 2019-03-25 DIAGNOSIS — Z981 Arthrodesis status: Secondary | ICD-10-CM | POA: Diagnosis not present

## 2019-03-25 DIAGNOSIS — M4316 Spondylolisthesis, lumbar region: Secondary | ICD-10-CM | POA: Diagnosis not present

## 2019-03-25 DIAGNOSIS — Z7984 Long term (current) use of oral hypoglycemic drugs: Secondary | ICD-10-CM | POA: Diagnosis not present

## 2019-03-27 DIAGNOSIS — Z7984 Long term (current) use of oral hypoglycemic drugs: Secondary | ICD-10-CM | POA: Diagnosis not present

## 2019-03-27 DIAGNOSIS — M48062 Spinal stenosis, lumbar region with neurogenic claudication: Secondary | ICD-10-CM | POA: Diagnosis not present

## 2019-03-27 DIAGNOSIS — M4316 Spondylolisthesis, lumbar region: Secondary | ICD-10-CM | POA: Diagnosis not present

## 2019-03-27 DIAGNOSIS — Z981 Arthrodesis status: Secondary | ICD-10-CM | POA: Diagnosis not present

## 2019-03-27 DIAGNOSIS — Z7982 Long term (current) use of aspirin: Secondary | ICD-10-CM | POA: Diagnosis not present

## 2019-04-01 DIAGNOSIS — R300 Dysuria: Secondary | ICD-10-CM | POA: Diagnosis not present

## 2019-04-03 DIAGNOSIS — Z7982 Long term (current) use of aspirin: Secondary | ICD-10-CM | POA: Diagnosis not present

## 2019-04-03 DIAGNOSIS — M4316 Spondylolisthesis, lumbar region: Secondary | ICD-10-CM | POA: Diagnosis not present

## 2019-04-03 DIAGNOSIS — M48062 Spinal stenosis, lumbar region with neurogenic claudication: Secondary | ICD-10-CM | POA: Diagnosis not present

## 2019-04-03 DIAGNOSIS — Z981 Arthrodesis status: Secondary | ICD-10-CM | POA: Diagnosis not present

## 2019-04-03 DIAGNOSIS — Z7984 Long term (current) use of oral hypoglycemic drugs: Secondary | ICD-10-CM | POA: Diagnosis not present

## 2019-04-09 DIAGNOSIS — H43821 Vitreomacular adhesion, right eye: Secondary | ICD-10-CM | POA: Diagnosis not present

## 2019-04-09 DIAGNOSIS — E113311 Type 2 diabetes mellitus with moderate nonproliferative diabetic retinopathy with macular edema, right eye: Secondary | ICD-10-CM | POA: Diagnosis not present

## 2019-04-09 DIAGNOSIS — H59031 Cystoid macular edema following cataract surgery, right eye: Secondary | ICD-10-CM | POA: Diagnosis not present

## 2019-04-09 DIAGNOSIS — H3561 Retinal hemorrhage, right eye: Secondary | ICD-10-CM | POA: Diagnosis not present

## 2019-04-10 DIAGNOSIS — M48062 Spinal stenosis, lumbar region with neurogenic claudication: Secondary | ICD-10-CM | POA: Diagnosis not present

## 2019-04-10 DIAGNOSIS — Z7982 Long term (current) use of aspirin: Secondary | ICD-10-CM | POA: Diagnosis not present

## 2019-04-10 DIAGNOSIS — Z981 Arthrodesis status: Secondary | ICD-10-CM | POA: Diagnosis not present

## 2019-04-10 DIAGNOSIS — M4316 Spondylolisthesis, lumbar region: Secondary | ICD-10-CM | POA: Diagnosis not present

## 2019-04-10 DIAGNOSIS — Z7984 Long term (current) use of oral hypoglycemic drugs: Secondary | ICD-10-CM | POA: Diagnosis not present

## 2019-04-12 DIAGNOSIS — B353 Tinea pedis: Secondary | ICD-10-CM | POA: Diagnosis not present

## 2019-04-12 DIAGNOSIS — L602 Onychogryphosis: Secondary | ICD-10-CM | POA: Diagnosis not present

## 2019-04-12 DIAGNOSIS — E1351 Other specified diabetes mellitus with diabetic peripheral angiopathy without gangrene: Secondary | ICD-10-CM | POA: Diagnosis not present

## 2019-04-17 DIAGNOSIS — M4316 Spondylolisthesis, lumbar region: Secondary | ICD-10-CM | POA: Diagnosis not present

## 2019-04-17 DIAGNOSIS — Z7984 Long term (current) use of oral hypoglycemic drugs: Secondary | ICD-10-CM | POA: Diagnosis not present

## 2019-04-17 DIAGNOSIS — M48062 Spinal stenosis, lumbar region with neurogenic claudication: Secondary | ICD-10-CM | POA: Diagnosis not present

## 2019-04-17 DIAGNOSIS — Z7982 Long term (current) use of aspirin: Secondary | ICD-10-CM | POA: Diagnosis not present

## 2019-04-17 DIAGNOSIS — Z981 Arthrodesis status: Secondary | ICD-10-CM | POA: Diagnosis not present

## 2019-05-13 DIAGNOSIS — H4321 Crystalline deposits in vitreous body, right eye: Secondary | ICD-10-CM | POA: Diagnosis not present

## 2019-05-13 DIAGNOSIS — E113392 Type 2 diabetes mellitus with moderate nonproliferative diabetic retinopathy without macular edema, left eye: Secondary | ICD-10-CM | POA: Diagnosis not present

## 2019-05-13 DIAGNOSIS — E113311 Type 2 diabetes mellitus with moderate nonproliferative diabetic retinopathy with macular edema, right eye: Secondary | ICD-10-CM | POA: Diagnosis not present

## 2019-05-13 DIAGNOSIS — H59031 Cystoid macular edema following cataract surgery, right eye: Secondary | ICD-10-CM | POA: Diagnosis not present

## 2019-05-16 ENCOUNTER — Other Ambulatory Visit: Payer: Self-pay | Admitting: Neurosurgery

## 2019-05-21 DIAGNOSIS — M5136 Other intervertebral disc degeneration, lumbar region: Secondary | ICD-10-CM | POA: Diagnosis not present

## 2019-05-31 NOTE — Pre-Procedure Instructions (Signed)
[name] Mathews  05/31/2019      [facility] [place], [name] - [name] AT [facility] [address] [name] [id number] Phone: [phone] Fax: [phone]  [id number] [place], [place] - [name] [place] [id number] Phone: [phone] Fax: [phone]    Your procedure is scheduled on Aug. 5  [facility] at 8:00 A.M.  Call this number if you have problems the morning of surgery:  [phone]   Remember:  Do not eat or drink after midnight.      Take these medicines the morning of surgery with A SIP OF WATER :               Tylenol if needed               Amlodipine (norvasc)               flonase if needed               linaclotide (linzess)        7 days prior to surgery STOP taking any Aspirin (unless otherwise instructed by your surgeon), Aleve, Naproxen, Ibuprofen, Motrin, Advil, Goody's, BC's, all herbal medications, fish oil, and all vitamins.               How to Manage Your Diabetes Before and After Surgery  Why is it important to control my blood sugar before and after surgery? . Improving blood sugar levels before and after surgery helps healing and can limit problems. . A way of improving blood sugar control is eating a healthy diet by: o  Eating less sugar and carbohydrates o  Increasing activity/exercise o  Talking with your doctor about reaching your blood sugar goals . High blood sugars (greater than 180 mg/dL) can raise your risk of infections and slow your recovery, so you will need to focus on controlling your diabetes during the weeks before surgery. . Make sure that the doctor who takes care of your diabetes knows about your planned surgery including the date and location.  How do I manage my blood sugar before surgery? . Check your blood sugar at least 4 times a day, starting 2 days before surgery, to make sure that  the level is not too high or low. o Check your blood sugar the morning of your surgery when you wake up and every 2 hours until you get to the Short Stay unit. . If your blood sugar is less than 70 mg/dL, you will need to treat for low blood sugar: o Do not take insulin. o Treat a low blood sugar (less than 70 mg/dL) with  cup of clear juice (cranberry or apple), 4 glucose tablets, OR glucose gel. Recheck blood sugar in 15 minutes after treatment (to make sure it is greater than 70 mg/dL). If your blood sugar is not greater than 70 mg/dL on recheck, call [phone] o  for further instructions. . Report your blood sugar to the short stay nurse when you get to Short Stay.  . If you are admitted to the hospital after surgery: o Your blood sugar will be checked by the staff and you will probably be given insulin after surgery (instead of oral diabetes medicines) to make sure you have good blood sugar levels. o The goal for blood sugar control after surgery is 80-180 mg/dL.  WHAT DO I DO ABOUT MY DIABETES MEDICATION?   Marland Kitchen Do not take oral diabetes medicines (pills) the morning of surgery.                         Do not wear jewelry, make-up or nail polish.  Do not wear lotions, powders, or perfumes, or deodorant.  Do not shave 48 hours prior to surgery.  Men may shave face and neck.  Do not bring valuables to the hospital.  [facility] is not responsible for any belongings or valuables.  Contacts, dentures or bridgework may not be worn into surgery.  Leave your suitcase in the car.  After surgery it may be brought to your room.  For patients admitted to the hospital, discharge time will be determined by your treatment team.  Patients discharged the day of surgery will not be allowed to drive home.    Special instructions:   [facility]- Preparing For Surgery  Before surgery, you can play an important role. Because skin is not sterile, your skin needs to be as free of  germs as possible. You can reduce the number of germs on your skin by washing with CHG (chlorahexidine gluconate) Soap before surgery.  CHG is an antiseptic cleaner which kills germs and bonds with the skin to continue killing germs even after washing.    Oral Hygiene is also important to reduce your risk of infection.  Remember - BRUSH YOUR TEETH THE MORNING OF SURGERY WITH YOUR REGULAR TOOTHPASTE  Please do not use if you have an allergy to CHG or antibacterial soaps. If your skin becomes reddened/irritated stop using the CHG.  Do not shave (including legs and underarms) for at least 48 hours prior to first CHG shower. It is OK to shave your face.  Please follow these instructions carefully.   1. Shower the NIGHT BEFORE SURGERY and the MORNING OF SURGERY with CHG.   2. If you chose to wash your hair, wash your hair first as usual with your normal shampoo.  3. After you shampoo, rinse your hair and body thoroughly to remove the shampoo.  4. Use CHG as you would any other liquid soap. You can apply CHG directly to the skin and wash gently with a scrungie or a clean washcloth.   5. Apply the CHG Soap to your body ONLY FROM THE NECK DOWN.  Do not use on open wounds or open sores. Avoid contact with your eyes, ears, mouth and genitals (private parts). Wash Face and genitals (private parts)  with your normal soap.  6. Wash thoroughly, paying special attention to the area where your surgery will be performed.  7. Thoroughly rinse your body with warm water from the neck down.  8. DO NOT shower/wash with your normal soap after using and rinsing off the CHG Soap.  9. Pat yourself dry with a CLEAN TOWEL.  10. Wear CLEAN PAJAMAS to bed the night before surgery, wear comfortable clothes the morning of surgery  11. Place CLEAN SHEETS on your bed the night of your first shower and DO NOT SLEEP WITH PETS.    Day of Surgery:  Do not apply any deodorants/lotions.  Please wear clean clothes to the  hospital/surgery center.   Remember to brush your teeth WITH YOUR REGULAR TOOTHPASTE.    Please read over the following fact sheets that you were given. Coughing and Deep Breathing and Surgical Site Infection Prevention

## 2019-06-03 ENCOUNTER — Other Ambulatory Visit (HOSPITAL_COMMUNITY)
Admission: RE | Admit: 2019-06-03 | Discharge: 2019-06-03 | Disposition: A | Discharge status: Home / Self Care | Payer: Medicare Other | Source: Ambulatory Visit | Attending: Neurosurgery | Admitting: Neurosurgery

## 2019-06-03 ENCOUNTER — Other Ambulatory Visit: Payer: Self-pay

## 2019-06-03 ENCOUNTER — Encounter (HOSPITAL_COMMUNITY)
Admission: RE | Admit: 2019-06-03 | Discharge: 2019-06-03 | Disposition: A | Discharge status: Home / Self Care | Payer: Medicare Other | Source: Ambulatory Visit | Attending: Neurosurgery | Admitting: Neurosurgery

## 2019-06-03 ENCOUNTER — Encounter (HOSPITAL_COMMUNITY): Payer: Self-pay

## 2019-06-03 DIAGNOSIS — Z20828 Contact with and (suspected) exposure to other viral communicable diseases: Secondary | ICD-10-CM | POA: Insufficient documentation

## 2019-06-03 DIAGNOSIS — Z01812 Encounter for preprocedural laboratory examination: Secondary | ICD-10-CM | POA: Insufficient documentation

## 2019-06-03 LAB — BASIC METABOLIC PANEL
Anion gap: 9 (ref 5–15)
BUN: 25 mg/dL — ABNORMAL HIGH (ref 8–23)
CO2: 23 mmol/L (ref 22–32)
Calcium: 9 mg/dL (ref 8.9–10.3)
Chloride: 105 mmol/L (ref 98–111)
Creatinine, Ser: 1.35 mg/dL — ABNORMAL HIGH (ref 0.44–1.00)
GFR calc Af Amer: 42 mL/min — ABNORMAL LOW (ref >60)
GFR calc non Af Amer: 36 mL/min — ABNORMAL LOW (ref >60)
Glucose, Bld: 145 mg/dL — ABNORMAL HIGH (ref 70–99)
Potassium: 4.3 mmol/L (ref 3.5–5.1)
Sodium: 137 mmol/L (ref 135–145)

## 2019-06-03 LAB — SURGICAL PCR SCREEN
MRSA, PCR: NEGATIVE
Staphylococcus aureus: NEGATIVE

## 2019-06-03 LAB — SARS CORONAVIRUS 2 (TAT 6-24 HRS): SARS Coronavirus 2: NEGATIVE

## 2019-06-03 LAB — CBC
HCT: 33.5 % — ABNORMAL LOW (ref 36.0–46.0)
Hemoglobin: 10.3 g/dL — ABNORMAL LOW (ref 12.0–15.0)
MCH: 29.2 pg (ref 26.0–34.0)
MCHC: 30.7 g/dL (ref 30.0–36.0)
MCV: 94.9 fL (ref 80.0–100.0)
Platelets: 204 10*3/uL (ref 150–400)
RBC: 3.53 MIL/uL — ABNORMAL LOW (ref 3.87–5.11)
RDW: 14.4 % (ref 11.5–15.5)
WBC: 5 10*3/uL (ref 4.0–10.5)
nRBC: 0 % (ref 0.0–0.2)

## 2019-06-03 LAB — TYPE AND SCREEN
ABO/RH(D): A POS
Antibody Screen: NEGATIVE

## 2019-06-03 LAB — HEMOGLOBIN A1C
Hgb A1c MFr Bld: 6.6 % — ABNORMAL HIGH (ref 4.8–5.6)
Mean Plasma Glucose: 142.72 mg/dL

## 2019-06-03 LAB — GLUCOSE, CAPILLARY: Glucose-Capillary: 201 mg/dL — ABNORMAL HIGH (ref 70–99)

## 2019-06-03 NOTE — Progress Notes (Signed)
PCP - mark perini Cardiologist - na  Chest x-ray - na EKG - 11/19 Stress Test - na ECHO - na Cardiac Cath - na  Sleep Study - 6/19--states she doesn't have sleep apnea CPAP -   Fasting Blood Sugar - 118 Checks Blood Sugar ____bid_ times a day  Blood Thinner Instructions: Aspirin Instructions: was stopped 06/01/19  Anesthesia review: ekg  Patient denies shortness of breath, fever, cough and chest pain at PAT appointment   Patient verbalized understanding of instructions that were given to them at the PAT appointment. Patient was also instructed that they will need to review over the PAT instructions again at home before surgery.

## 2019-06-04 ENCOUNTER — Other Ambulatory Visit: Payer: Self-pay | Admitting: Neurosurgery

## 2019-06-05 ENCOUNTER — Encounter (HOSPITAL_COMMUNITY)
Admission: RE | Disposition: A | Discharge status: Rehabilitation Facility | Payer: Self-pay | Source: Ambulatory Visit | Attending: Neurosurgery

## 2019-06-05 ENCOUNTER — Inpatient Hospital Stay (HOSPITAL_COMMUNITY): Payer: Medicare Other | Admitting: Physician Assistant

## 2019-06-05 ENCOUNTER — Other Ambulatory Visit: Payer: Self-pay

## 2019-06-05 ENCOUNTER — Inpatient Hospital Stay (HOSPITAL_COMMUNITY)
Admission: RE | Admit: 2019-06-05 | Discharge: 2019-06-10 | DRG: 454 | Disposition: A | Discharge status: Rehabilitation Facility | Payer: Medicare Other | Attending: Neurosurgery | Admitting: Neurosurgery

## 2019-06-05 ENCOUNTER — Inpatient Hospital Stay (HOSPITAL_COMMUNITY): Payer: Medicare Other | Admitting: Certified Registered"

## 2019-06-05 ENCOUNTER — Inpatient Hospital Stay (HOSPITAL_COMMUNITY): Payer: Medicare Other

## 2019-06-05 ENCOUNTER — Encounter (HOSPITAL_COMMUNITY): Payer: Self-pay | Admitting: Certified Registered"

## 2019-06-05 DIAGNOSIS — M48062 Spinal stenosis, lumbar region with neurogenic claudication: Secondary | ICD-10-CM | POA: Diagnosis present

## 2019-06-05 DIAGNOSIS — Z1159 Encounter for screening for other viral diseases: Secondary | ICD-10-CM | POA: Diagnosis not present

## 2019-06-05 DIAGNOSIS — R4189 Other symptoms and signs involving cognitive functions and awareness: Secondary | ICD-10-CM | POA: Diagnosis present

## 2019-06-05 DIAGNOSIS — R339 Retention of urine, unspecified: Secondary | ICD-10-CM | POA: Diagnosis present

## 2019-06-05 DIAGNOSIS — E1169 Type 2 diabetes mellitus with other specified complication: Secondary | ICD-10-CM | POA: Diagnosis not present

## 2019-06-05 DIAGNOSIS — E669 Obesity, unspecified: Secondary | ICD-10-CM | POA: Diagnosis not present

## 2019-06-05 DIAGNOSIS — I129 Hypertensive chronic kidney disease with stage 1 through stage 4 chronic kidney disease, or unspecified chronic kidney disease: Secondary | ICD-10-CM | POA: Diagnosis present

## 2019-06-05 DIAGNOSIS — Z79899 Other long term (current) drug therapy: Secondary | ICD-10-CM

## 2019-06-05 DIAGNOSIS — Z7984 Long term (current) use of oral hypoglycemic drugs: Secondary | ICD-10-CM

## 2019-06-05 DIAGNOSIS — M5416 Radiculopathy, lumbar region: Secondary | ICD-10-CM | POA: Diagnosis not present

## 2019-06-05 DIAGNOSIS — E119 Type 2 diabetes mellitus without complications: Secondary | ICD-10-CM | POA: Diagnosis not present

## 2019-06-05 DIAGNOSIS — G8929 Other chronic pain: Secondary | ICD-10-CM | POA: Diagnosis present

## 2019-06-05 DIAGNOSIS — K5901 Slow transit constipation: Secondary | ICD-10-CM | POA: Diagnosis not present

## 2019-06-05 DIAGNOSIS — Z6830 Body mass index (BMI) 30.0-30.9, adult: Secondary | ICD-10-CM | POA: Diagnosis not present

## 2019-06-05 DIAGNOSIS — Z7982 Long term (current) use of aspirin: Secondary | ICD-10-CM | POA: Diagnosis not present

## 2019-06-05 DIAGNOSIS — E1122 Type 2 diabetes mellitus with diabetic chronic kidney disease: Secondary | ICD-10-CM | POA: Diagnosis not present

## 2019-06-05 DIAGNOSIS — K5900 Constipation, unspecified: Secondary | ICD-10-CM | POA: Diagnosis present

## 2019-06-05 DIAGNOSIS — K5903 Drug induced constipation: Secondary | ICD-10-CM | POA: Diagnosis not present

## 2019-06-05 DIAGNOSIS — E785 Hyperlipidemia, unspecified: Secondary | ICD-10-CM | POA: Diagnosis present

## 2019-06-05 DIAGNOSIS — M5116 Intervertebral disc disorders with radiculopathy, lumbar region: Secondary | ICD-10-CM | POA: Diagnosis present

## 2019-06-05 DIAGNOSIS — R7309 Other abnormal glucose: Secondary | ICD-10-CM | POA: Diagnosis not present

## 2019-06-05 DIAGNOSIS — E1136 Type 2 diabetes mellitus with diabetic cataract: Secondary | ICD-10-CM | POA: Diagnosis present

## 2019-06-05 DIAGNOSIS — D649 Anemia, unspecified: Secondary | ICD-10-CM | POA: Diagnosis not present

## 2019-06-05 DIAGNOSIS — M4316 Spondylolisthesis, lumbar region: Secondary | ICD-10-CM | POA: Diagnosis not present

## 2019-06-05 DIAGNOSIS — R0989 Other specified symptoms and signs involving the circulatory and respiratory systems: Secondary | ICD-10-CM | POA: Diagnosis not present

## 2019-06-05 DIAGNOSIS — N182 Chronic kidney disease, stage 2 (mild): Secondary | ICD-10-CM | POA: Diagnosis present

## 2019-06-05 DIAGNOSIS — I10 Essential (primary) hypertension: Secondary | ICD-10-CM | POA: Diagnosis not present

## 2019-06-05 DIAGNOSIS — M5136 Other intervertebral disc degeneration, lumbar region: Secondary | ICD-10-CM

## 2019-06-05 DIAGNOSIS — I679 Cerebrovascular disease, unspecified: Secondary | ICD-10-CM | POA: Diagnosis not present

## 2019-06-05 DIAGNOSIS — D62 Acute posthemorrhagic anemia: Secondary | ICD-10-CM | POA: Diagnosis not present

## 2019-06-05 DIAGNOSIS — Z981 Arthrodesis status: Secondary | ICD-10-CM | POA: Diagnosis not present

## 2019-06-05 DIAGNOSIS — I6789 Other cerebrovascular disease: Secondary | ICD-10-CM | POA: Diagnosis not present

## 2019-06-05 DIAGNOSIS — Z419 Encounter for procedure for purposes other than remedying health state, unspecified: Secondary | ICD-10-CM

## 2019-06-05 DIAGNOSIS — M7989 Other specified soft tissue disorders: Secondary | ICD-10-CM | POA: Diagnosis not present

## 2019-06-05 DIAGNOSIS — R410 Disorientation, unspecified: Secondary | ICD-10-CM | POA: Diagnosis not present

## 2019-06-05 DIAGNOSIS — Z4789 Encounter for other orthopedic aftercare: Secondary | ICD-10-CM | POA: Diagnosis not present

## 2019-06-05 LAB — GLUCOSE, CAPILLARY
Glucose-Capillary: 116 mg/dL — ABNORMAL HIGH (ref 70–99)
Glucose-Capillary: 116 mg/dL — ABNORMAL HIGH (ref 70–99)
Glucose-Capillary: 161 mg/dL — ABNORMAL HIGH (ref 70–99)
Glucose-Capillary: 202 mg/dL — ABNORMAL HIGH (ref 70–99)
Glucose-Capillary: 217 mg/dL — ABNORMAL HIGH (ref 70–99)
Glucose-Capillary: 215 mg/dL — ABNORMAL HIGH (ref 70–99)

## 2019-06-05 SURGERY — POSTERIOR LUMBAR FUSION 1 LEVEL
Anesthesia: General | Site: Back

## 2019-06-05 MED ORDER — FERROUS SULFATE DRIED ER 160 (50 FE) MG PO TBCR: 160.0000 mg | EXTENDED_RELEASE_TABLET | Freq: Every day | ORAL | Status: DC

## 2019-06-05 MED ORDER — LACTATED RINGERS IV SOLN
INTRAVENOUS | Status: DC | PRN
  Administered 2019-06-05: 09:00:00 via INTRAVENOUS

## 2019-06-05 MED ORDER — THROMBIN 5000 UNITS EX SOLR
CUTANEOUS | Status: AC
  Filled 2019-06-05: qty 10000

## 2019-06-05 MED ORDER — ROCURONIUM BROMIDE 10 MG/ML (PF) SYRINGE
PREFILLED_SYRINGE | INTRAVENOUS | Status: AC
  Filled 2019-06-05: qty 10

## 2019-06-05 MED ORDER — ACETAMINOPHEN 500 MG PO TABS
1000.0000 mg | ORAL_TABLET | Freq: Four times a day (QID) | ORAL | Status: AC
  Administered 2019-06-05 – 2019-06-06 (×4): 1000 mg via ORAL
  Filled 2019-06-05 (×4): qty 2

## 2019-06-05 MED ORDER — OXYCODONE HCL 5 MG PO TABS
10.0000 mg | ORAL_TABLET | ORAL | Status: DC | PRN
  Filled 2019-06-05: qty 2

## 2019-06-05 MED ORDER — ONDANSETRON HCL 4 MG/2ML IJ SOLN
INTRAMUSCULAR | Status: AC
  Filled 2019-06-05: qty 2

## 2019-06-05 MED ORDER — LIDOCAINE 2% (20 MG/ML) 5 ML SYRINGE
INTRAMUSCULAR | Status: AC
  Filled 2019-06-05: qty 5

## 2019-06-05 MED ORDER — CYCLOBENZAPRINE HCL 10 MG PO TABS: 10.0000 mg | ORAL_TABLET | Freq: Three times a day (TID) | ORAL | Status: DC | PRN

## 2019-06-05 MED ORDER — SODIUM CHLORIDE 0.9 % IV SOLN
INTRAVENOUS | Status: DC | PRN
  Administered 2019-06-05: 12:00:00 500 mL

## 2019-06-05 MED ORDER — LINACLOTIDE 145 MCG PO CAPS
145.0000 ug | ORAL_CAPSULE | ORAL | Status: DC
  Administered 2019-06-07 – 2019-06-09 (×2): 145 ug via ORAL
  Filled 2019-06-05 (×2): qty 1

## 2019-06-05 MED ORDER — MORPHINE SULFATE (PF) 4 MG/ML IV SOLN
4.0000 mg | INTRAVENOUS | Status: DC | PRN
  Administered 2019-06-05 – 2019-06-06 (×2): 4 mg via INTRAVENOUS
  Filled 2019-06-05: qty 1

## 2019-06-05 MED ORDER — ADULT MULTIVITAMIN W/MINERALS CH
ORAL_TABLET | Freq: Every day | ORAL | Status: DC
  Administered 2019-06-05 – 2019-06-10 (×6): 1 via ORAL
  Filled 2019-06-05 (×7): qty 1

## 2019-06-05 MED ORDER — ONDANSETRON HCL 4 MG/2ML IJ SOLN
INTRAMUSCULAR | Status: DC | PRN
  Administered 2019-06-05: 4 mg via INTRAVENOUS

## 2019-06-05 MED ORDER — SODIUM CHLORIDE 0.9% FLUSH: 3.0000 mL | INTRAVENOUS | Status: DC | PRN

## 2019-06-05 MED ORDER — FENTANYL CITRATE (PF) 100 MCG/2ML IJ SOLN
INTRAMUSCULAR | Status: DC | PRN
  Administered 2019-06-05 (×3): 50 ug via INTRAVENOUS
  Administered 2019-06-05 (×2): 100 ug via INTRAVENOUS
  Administered 2019-06-05: 50 ug via INTRAVENOUS

## 2019-06-05 MED ORDER — BACITRACIN ZINC 500 UNIT/GM EX OINT
TOPICAL_OINTMENT | CUTANEOUS | Status: DC | PRN
  Administered 2019-06-05: 1 via TOPICAL

## 2019-06-05 MED ORDER — ONDANSETRON HCL 4 MG PO TABS: 4.0000 mg | ORAL_TABLET | Freq: Four times a day (QID) | ORAL | Status: DC | PRN

## 2019-06-05 MED ORDER — PROPOFOL 10 MG/ML IV BOLUS
INTRAVENOUS | Status: DC | PRN
  Administered 2019-06-05: 100 mg via INTRAVENOUS

## 2019-06-05 MED ORDER — OXYCODONE HCL 5 MG PO TABS
5.0000 mg | ORAL_TABLET | ORAL | Status: DC | PRN
  Administered 2019-06-06 (×2): 5 mg via ORAL
  Filled 2019-06-05 (×2): qty 1

## 2019-06-05 MED ORDER — BUPIVACAINE LIPOSOME 1.3 % IJ SUSP
20.0000 mL | Freq: Once | INTRAMUSCULAR | Status: DC
  Filled 2019-06-05: qty 20

## 2019-06-05 MED ORDER — CHLORHEXIDINE GLUCONATE CLOTH 2 % EX PADS: 6.0000 | MEDICATED_PAD | Freq: Once | CUTANEOUS | Status: DC

## 2019-06-05 MED ORDER — BUPIVACAINE-EPINEPHRINE (PF) 0.5% -1:200000 IJ SOLN
INTRAMUSCULAR | Status: DC | PRN
  Administered 2019-06-05: 10 mL

## 2019-06-05 MED ORDER — BUPIVACAINE LIPOSOME 1.3 % IJ SUSP: INTRAMUSCULAR | Status: DC | PRN

## 2019-06-05 MED ORDER — SUGAMMADEX SODIUM 200 MG/2ML IV SOLN
INTRAVENOUS | Status: DC | PRN
  Administered 2019-06-05: 350 mg via INTRAVENOUS

## 2019-06-05 MED ORDER — LIDOCAINE 2% (20 MG/ML) 5 ML SYRINGE
INTRAMUSCULAR | Status: DC | PRN
  Administered 2019-06-05: 40 mg via INTRAVENOUS
  Administered 2019-06-05: 60 mg via INTRAVENOUS

## 2019-06-05 MED ORDER — METFORMIN HCL 500 MG PO TABS
500.0000 mg | ORAL_TABLET | Freq: Two times a day (BID) | ORAL | Status: DC
  Administered 2019-06-05 – 2019-06-10 (×10): 500 mg via ORAL
  Filled 2019-06-05 (×10): qty 1

## 2019-06-05 MED ORDER — DOCUSATE SODIUM 50 MG PO CAPS
250.0000 mg | ORAL_CAPSULE | Freq: Every day | ORAL | Status: DC
  Administered 2019-06-05 – 2019-06-10 (×6): 250 mg via ORAL
  Filled 2019-06-05 (×6): qty 1

## 2019-06-05 MED ORDER — SODIUM CHLORIDE 0.9 % IV SOLN
250.0000 mL | INTRAVENOUS | Status: DC
  Administered 2019-06-05: 250 mL via INTRAVENOUS

## 2019-06-05 MED ORDER — PROPOFOL 10 MG/ML IV BOLUS
INTRAVENOUS | Status: AC
  Filled 2019-06-05: qty 20

## 2019-06-05 MED ORDER — HYDROCHLOROTHIAZIDE 12.5 MG PO CAPS
12.5000 mg | ORAL_CAPSULE | Freq: Every day | ORAL | Status: DC
  Administered 2019-06-05 – 2019-06-10 (×6): 12.5 mg via ORAL
  Filled 2019-06-05 (×7): qty 1

## 2019-06-05 MED ORDER — FERROUS SULFATE 325 (65 FE) MG PO TABS
325.0000 mg | ORAL_TABLET | Freq: Every day | ORAL | Status: DC
  Administered 2019-06-06 – 2019-06-10 (×5): 325 mg via ORAL
  Filled 2019-06-05 (×6): qty 1

## 2019-06-05 MED ORDER — ACETAMINOPHEN 325 MG PO TABS
650.0000 mg | ORAL_TABLET | ORAL | Status: DC | PRN
  Administered 2019-06-06 – 2019-06-08 (×3): 650 mg via ORAL
  Filled 2019-06-05 (×4): qty 2

## 2019-06-05 MED ORDER — CEFAZOLIN SODIUM-DEXTROSE 2-4 GM/100ML-% IV SOLN
2.0000 g | Freq: Three times a day (TID) | INTRAVENOUS | Status: AC
  Administered 2019-06-05 – 2019-06-06 (×2): 2 g via INTRAVENOUS
  Filled 2019-06-05 (×2): qty 100

## 2019-06-05 MED ORDER — FENTANYL CITRATE (PF) 100 MCG/2ML IJ SOLN
25.0000 ug | INTRAMUSCULAR | Status: DC | PRN
  Administered 2019-06-05: 25 ug via INTRAVENOUS
  Administered 2019-06-05 (×2): 12.5 ug via INTRAVENOUS

## 2019-06-05 MED ORDER — PHENOL 1.4 % MT LIQD
1.0000 | OROMUCOSAL | Status: DC | PRN
  Administered 2019-06-05: 1 via OROMUCOSAL
  Filled 2019-06-05: qty 177

## 2019-06-05 MED ORDER — DEXAMETHASONE SODIUM PHOSPHATE 10 MG/ML IJ SOLN
INTRAMUSCULAR | Status: DC | PRN
  Administered 2019-06-05: 8 mg via INTRAVENOUS

## 2019-06-05 MED ORDER — FENTANYL CITRATE (PF) 250 MCG/5ML IJ SOLN
INTRAMUSCULAR | Status: AC
  Filled 2019-06-05: qty 5

## 2019-06-05 MED ORDER — IRBESARTAN 150 MG PO TABS
150.0000 mg | ORAL_TABLET | Freq: Every day | ORAL | Status: DC
  Administered 2019-06-05 – 2019-06-10 (×6): 150 mg via ORAL
  Filled 2019-06-05 (×6): qty 1

## 2019-06-05 MED ORDER — INSULIN ASPART 100 UNIT/ML ~~LOC~~ SOLN
0.0000 [IU] | SUBCUTANEOUS | Status: DC
  Administered 2019-06-05 (×2): 7 [IU] via SUBCUTANEOUS
  Administered 2019-06-06: 3 [IU] via SUBCUTANEOUS
  Administered 2019-06-06 (×2): 4 [IU] via SUBCUTANEOUS
  Administered 2019-06-06: 3 [IU] via SUBCUTANEOUS
  Administered 2019-06-06 – 2019-06-07 (×5): 4 [IU] via SUBCUTANEOUS
  Administered 2019-06-07: 08:00:00 3 [IU] via SUBCUTANEOUS
  Administered 2019-06-07 – 2019-06-08 (×3): 4 [IU] via SUBCUTANEOUS
  Administered 2019-06-08 (×3): 3 [IU] via SUBCUTANEOUS
  Administered 2019-06-09: 4 [IU] via SUBCUTANEOUS
  Administered 2019-06-09 – 2019-06-10 (×4): 3 [IU] via SUBCUTANEOUS

## 2019-06-05 MED ORDER — 0.9 % SODIUM CHLORIDE (POUR BTL) OPTIME
TOPICAL | Status: DC | PRN
  Administered 2019-06-05: 12:00:00 1000 mL

## 2019-06-05 MED ORDER — ALBUMIN HUMAN 5 % IV SOLN
INTRAVENOUS | Status: DC | PRN
  Administered 2019-06-05 (×2): via INTRAVENOUS

## 2019-06-05 MED ORDER — AMLODIPINE BESYLATE 10 MG PO TABS
10.0000 mg | ORAL_TABLET | Freq: Every day | ORAL | Status: DC
  Administered 2019-06-06 – 2019-06-10 (×5): 10 mg via ORAL
  Filled 2019-06-05: qty 2
  Filled 2019-06-05 (×4): qty 1

## 2019-06-05 MED ORDER — VANCOMYCIN HCL 1 G IV SOLR: INTRAVENOUS | Status: DC | PRN

## 2019-06-05 MED ORDER — ROSUVASTATIN CALCIUM 5 MG PO TABS
5.0000 mg | ORAL_TABLET | Freq: Every day | ORAL | Status: DC
  Administered 2019-06-05 – 2019-06-10 (×6): 5 mg via ORAL
  Filled 2019-06-05 (×6): qty 1

## 2019-06-05 MED ORDER — VANCOMYCIN HCL 1000 MG IV SOLR
INTRAVENOUS | Status: AC
  Filled 2019-06-05: qty 1000

## 2019-06-05 MED ORDER — PHENYLEPHRINE 40 MCG/ML (10ML) SYRINGE FOR IV PUSH (FOR BLOOD PRESSURE SUPPORT)
PREFILLED_SYRINGE | INTRAVENOUS | Status: DC | PRN
  Administered 2019-06-05 (×4): 80 ug via INTRAVENOUS

## 2019-06-05 MED ORDER — SODIUM CHLORIDE 0.9 % IV SOLN
INTRAVENOUS | Status: DC | PRN
  Administered 2019-06-05: 60 ug/min via INTRAVENOUS

## 2019-06-05 MED ORDER — GLYCOPYRROLATE PF 0.2 MG/ML IJ SOSY
PREFILLED_SYRINGE | INTRAMUSCULAR | Status: AC
  Filled 2019-06-05: qty 1

## 2019-06-05 MED ORDER — BUPIVACAINE-EPINEPHRINE (PF) 0.5% -1:200000 IJ SOLN
INTRAMUSCULAR | Status: AC
  Filled 2019-06-05: qty 30

## 2019-06-05 MED ORDER — BACITRACIN ZINC 500 UNIT/GM EX OINT
TOPICAL_OINTMENT | CUTANEOUS | Status: AC
  Filled 2019-06-05: qty 28.35

## 2019-06-05 MED ORDER — MORPHINE SULFATE (PF) 4 MG/ML IV SOLN
INTRAVENOUS | Status: AC
  Filled 2019-06-05: qty 1

## 2019-06-05 MED ORDER — SUCCINYLCHOLINE CHLORIDE 200 MG/10ML IV SOSY
PREFILLED_SYRINGE | INTRAVENOUS | Status: AC
  Filled 2019-06-05: qty 10

## 2019-06-05 MED ORDER — ACETAMINOPHEN 650 MG RE SUPP: 650.0000 mg | RECTAL | Status: DC | PRN

## 2019-06-05 MED ORDER — ZOLPIDEM TARTRATE 5 MG PO TABS: 5.0000 mg | ORAL_TABLET | Freq: Every evening | ORAL | Status: DC | PRN

## 2019-06-05 MED ORDER — TELMISARTAN-HCTZ 40-12.5 MG PO TABS: 1.0000 | ORAL_TABLET | Freq: Every day | ORAL | Status: DC

## 2019-06-05 MED ORDER — CEFAZOLIN SODIUM-DEXTROSE 2-4 GM/100ML-% IV SOLN
INTRAVENOUS | Status: AC
  Filled 2019-06-05: qty 100

## 2019-06-05 MED ORDER — MENTHOL 3 MG MT LOZG: 1.0000 | LOZENGE | OROMUCOSAL | Status: DC | PRN

## 2019-06-05 MED ORDER — THROMBIN 5000 UNITS EX SOLR
OROMUCOSAL | Status: DC | PRN
  Administered 2019-06-05: 5 mL via TOPICAL

## 2019-06-05 MED ORDER — PIOGLITAZONE HCL 30 MG PO TABS
30.0000 mg | ORAL_TABLET | Freq: Every day | ORAL | Status: DC
  Administered 2019-06-05 – 2019-06-10 (×6): 30 mg via ORAL
  Filled 2019-06-05 (×6): qty 1

## 2019-06-05 MED ORDER — SODIUM CHLORIDE 0.9% FLUSH
3.0000 mL | Freq: Two times a day (BID) | INTRAVENOUS | Status: DC
  Administered 2019-06-05 – 2019-06-10 (×6): 3 mL via INTRAVENOUS

## 2019-06-05 MED ORDER — GLYCOPYRROLATE PF 0.2 MG/ML IJ SOSY
PREFILLED_SYRINGE | INTRAMUSCULAR | Status: DC | PRN
  Administered 2019-06-05: .1 mg via INTRAVENOUS

## 2019-06-05 MED ORDER — LACTATED RINGERS IV SOLN
INTRAVENOUS | Status: DC
  Administered 2019-06-05: 09:00:00 via INTRAVENOUS

## 2019-06-05 MED ORDER — FENTANYL CITRATE (PF) 100 MCG/2ML IJ SOLN
INTRAMUSCULAR | Status: AC
  Administered 2019-06-05: 15:00:00 25 ug via INTRAVENOUS
  Filled 2019-06-05: qty 2

## 2019-06-05 MED ORDER — FLUTICASONE PROPIONATE 50 MCG/ACT NA SUSP
1.0000 | Freq: Every day | NASAL | Status: DC | PRN
  Filled 2019-06-05: qty 16

## 2019-06-05 MED ORDER — CEFAZOLIN SODIUM-DEXTROSE 2-4 GM/100ML-% IV SOLN
2.0000 g | INTRAVENOUS | Status: AC
  Administered 2019-06-05: 2 g via INTRAVENOUS

## 2019-06-05 MED ORDER — ONDANSETRON HCL 4 MG/2ML IJ SOLN: 4.0000 mg | Freq: Four times a day (QID) | INTRAMUSCULAR | Status: DC | PRN

## 2019-06-05 MED ORDER — PHENYLEPHRINE 40 MCG/ML (10ML) SYRINGE FOR IV PUSH (FOR BLOOD PRESSURE SUPPORT)
PREFILLED_SYRINGE | INTRAVENOUS | Status: AC
  Filled 2019-06-05: qty 10

## 2019-06-05 MED ORDER — ROCURONIUM BROMIDE 50 MG/5ML IV SOSY
PREFILLED_SYRINGE | INTRAVENOUS | Status: DC | PRN
  Administered 2019-06-05: 20 mg via INTRAVENOUS
  Administered 2019-06-05: 50 mg via INTRAVENOUS
  Administered 2019-06-05: 30 mg via INTRAVENOUS
  Administered 2019-06-05: 20 mg via INTRAVENOUS

## 2019-06-05 MED ORDER — BISACODYL 10 MG RE SUPP: 10.0000 mg | Freq: Every day | RECTAL | Status: DC | PRN

## 2019-06-05 MED ORDER — GALANTAMINE HYDROBROMIDE ER 8 MG PO CP24
16.0000 mg | ORAL_CAPSULE | Freq: Every evening | ORAL | Status: DC
  Administered 2019-06-05 – 2019-06-09 (×5): 16 mg via ORAL
  Filled 2019-06-05 (×6): qty 2

## 2019-06-05 MED ORDER — DEXAMETHASONE SODIUM PHOSPHATE 10 MG/ML IJ SOLN
INTRAMUSCULAR | Status: AC
  Filled 2019-06-05: qty 1

## 2019-06-05 SURGICAL SUPPLY — 74 items
BAG DECANTER FOR FLEXI CONT (MISCELLANEOUS) ×3 IMPLANT
BASKET BONE COLLECTION (BASKET) ×3 IMPLANT
BENZOIN TINCTURE PRP APPL 2/3 (GAUZE/BANDAGES/DRESSINGS) ×3 IMPLANT
BLADE CLIPPER SURG (BLADE) IMPLANT
BUR MATCHSTICK NEURO 3.0 LAGG (BURR) ×3 IMPLANT
BUR PRECISION FLUTE 6.0 (BURR) ×3 IMPLANT
CAGE RISE 11-17-15 10X26 (Cage) ×6 IMPLANT
CANISTER SUCT 3000ML PPV (MISCELLANEOUS) ×3 IMPLANT
CAP REVERE LOCKING (Cap) ×24 IMPLANT
CARTRIDGE OIL MAESTRO DRILL (MISCELLANEOUS) ×1 IMPLANT
CLOSURE WOUND 1/2 X4 (GAUZE/BANDAGES/DRESSINGS) ×1
CONN CROSSLINK REV 6.35 48-60 (Connector) ×3 IMPLANT
CONNECTOR CRSLNK REV6.35 48-60 (Connector) ×1 IMPLANT
CONT SPEC 4OZ CLIKSEAL STRL BL (MISCELLANEOUS) ×3 IMPLANT
COVER BACK TABLE 60X90IN (DRAPES) ×3 IMPLANT
COVER WAND RF STERILE (DRAPES) IMPLANT
DECANTER SPIKE VIAL GLASS SM (MISCELLANEOUS) ×3 IMPLANT
DIFFUSER DRILL AIR PNEUMATIC (MISCELLANEOUS) ×3 IMPLANT
DRAPE C-ARM 42X72 X-RAY (DRAPES) ×6 IMPLANT
DRAPE HALF SHEET 40X57 (DRAPES) ×3 IMPLANT
DRAPE LAPAROTOMY 100X72X124 (DRAPES) ×3 IMPLANT
DRAPE SURG 17X23 STRL (DRAPES) ×12 IMPLANT
DRSG OPSITE POSTOP 4X6 (GAUZE/BANDAGES/DRESSINGS) IMPLANT
DRSG OPSITE POSTOP 4X8 (GAUZE/BANDAGES/DRESSINGS) ×3 IMPLANT
ELECT BLADE 4.0 EZ CLEAN MEGAD (MISCELLANEOUS) ×3
ELECT REM PT RETURN 9FT ADLT (ELECTROSURGICAL) ×3
ELECTRODE BLDE 4.0 EZ CLN MEGD (MISCELLANEOUS) ×1 IMPLANT
ELECTRODE REM PT RTRN 9FT ADLT (ELECTROSURGICAL) ×1 IMPLANT
EVACUATOR 1/8 PVC DRAIN (DRAIN) IMPLANT
GAUZE 4X4 16PLY RFD (DISPOSABLE) IMPLANT
GAUZE SPONGE 4X4 12PLY STRL (GAUZE/BANDAGES/DRESSINGS) IMPLANT
GLOVE BIO SURGEON STRL SZ 6.5 (GLOVE) ×12 IMPLANT
GLOVE BIO SURGEON STRL SZ8 (GLOVE) ×6 IMPLANT
GLOVE BIO SURGEON STRL SZ8.5 (GLOVE) ×6 IMPLANT
GLOVE BIO SURGEONS STRL SZ 6.5 (GLOVE) ×6
GLOVE BIOGEL PI IND STRL 6.5 (GLOVE) ×4 IMPLANT
GLOVE BIOGEL PI IND STRL 7.0 (GLOVE) ×1 IMPLANT
GLOVE BIOGEL PI IND STRL 7.5 (GLOVE) ×1 IMPLANT
GLOVE BIOGEL PI INDICATOR 6.5 (GLOVE) ×8
GLOVE BIOGEL PI INDICATOR 7.0 (GLOVE) ×2
GLOVE BIOGEL PI INDICATOR 7.5 (GLOVE) ×2
GLOVE EXAM NITRILE XL STR (GLOVE) IMPLANT
GLOVE SURG SS PI 7.0 STRL IVOR (GLOVE) ×6 IMPLANT
GOWN STRL REUS W/ TWL LRG LVL3 (GOWN DISPOSABLE) ×4 IMPLANT
GOWN STRL REUS W/ TWL XL LVL3 (GOWN DISPOSABLE) ×2 IMPLANT
GOWN STRL REUS W/TWL 2XL LVL3 (GOWN DISPOSABLE) IMPLANT
GOWN STRL REUS W/TWL LRG LVL3 (GOWN DISPOSABLE) ×8
GOWN STRL REUS W/TWL XL LVL3 (GOWN DISPOSABLE) ×4
HEMOSTAT POWDER KIT SURGIFOAM (HEMOSTASIS) ×3 IMPLANT
KIT BASIN OR (CUSTOM PROCEDURE TRAY) ×3 IMPLANT
KIT TURNOVER KIT B (KITS) ×3 IMPLANT
MILL MEDIUM DISP (BLADE) IMPLANT
NEEDLE HYPO 21X1.5 SAFETY (NEEDLE) ×3 IMPLANT
NEEDLE HYPO 22GX1.5 SAFETY (NEEDLE) ×3 IMPLANT
NS IRRIG 1000ML POUR BTL (IV SOLUTION) ×3 IMPLANT
OIL CARTRIDGE MAESTRO DRILL (MISCELLANEOUS) ×3
PACK LAMINECTOMY NEURO (CUSTOM PROCEDURE TRAY) ×3 IMPLANT
PATTIES SURGICAL .5 X1 (DISPOSABLE) IMPLANT
PUTTY DBM 10CC CALC GRAN (Putty) ×3 IMPLANT
ROD REVERE 6.35 CURVED 125MM (Rod) ×6 IMPLANT
SCREW 7.5X50MM (Screw) ×6 IMPLANT
SPONGE LAP 4X18 RFD (DISPOSABLE) IMPLANT
SPONGE NEURO XRAY DETECT 1X3 (DISPOSABLE) IMPLANT
SPONGE SURGIFOAM ABS GEL 100 (HEMOSTASIS) IMPLANT
STRIP CLOSURE SKIN 1/2X4 (GAUZE/BANDAGES/DRESSINGS) ×2 IMPLANT
SUT PROLENE 6 0 BV (SUTURE) ×3 IMPLANT
SUT VIC AB 1 CT1 18XBRD ANBCTR (SUTURE) ×2 IMPLANT
SUT VIC AB 1 CT1 8-18 (SUTURE) ×4
SUT VIC AB 2-0 CP2 18 (SUTURE) ×6 IMPLANT
SYR 20ML LL LF (SYRINGE) ×3 IMPLANT
TOWEL GREEN STERILE (TOWEL DISPOSABLE) ×3 IMPLANT
TOWEL GREEN STERILE FF (TOWEL DISPOSABLE) ×3 IMPLANT
TRAY FOLEY MTR SLVR 16FR STAT (SET/KITS/TRAYS/PACK) ×3 IMPLANT
WATER STERILE IRR 1000ML POUR (IV SOLUTION) ×3 IMPLANT

## 2019-06-05 NOTE — Op Note (Signed)
Brief history: The patient is an 85 year old black female on whom I previously performed an L3-4 and L4-5 decompression, instrumentation and fusion years ago.  She initially did well but has developed recurrent back and right leg pain consistent with neurogenic claudication.  She has failed medical management and was worked up with a lumbar MRI which demonstrated an L2-3 spondylolisthesis, foraminal stenosis and L1-2 spinal stenosis.  I discussed the various treatment options with the patient.  She has weighed the risks, benefits and alternatives to surgery and decided to proceed with a lumbar decompression, instrumentation and fusion.  Preoperative diagnosis: L2-3 spondylolisthesis, degenerative disc disease, L1-2 and L2-3 spinal stenosis compressing L2 and L3 nerve roots; lumbago; lumbar radiculopathy; neurogenic claudication  Postoperative diagnosis: The same  Procedure: Bilateral L2-3 and L1 -2 laminotomy/foraminotomies/medial facetectomy to decompress the bilateral L2 and L3 nerve roots(the work required to do this was in addition to the work required to do the posterior lumbar interbody fusion because of the patient's spinal stenosis, facet arthropathy. Etc. requiring a wide decompression of the nerve roots.);  L2-3 posterior lumbar interbody fusion with local morselized autograft bone and Zimmer DBM; insertion of interbody prosthesis at L2-3 (globus peek expandable interbody prosthesis); posterior segmental instrumentation from L2 to L5 with globus titanium pedicle screws and rods; posterior lateral arthrodesis at L2-3 with local morselized autograft bone and Zimmer DBM; exploration of lumbar fusion/removal of lumbar hardware  Surgeon: Dr. Delma OfficerJeff Rielly Brunn  Asst.: Hildred PriestMegan Bergman nurse practitioner  Anesthesia: Gen. endotracheal  Estimated blood loss: 200 cc  Drains: None  Complications: None  Description of procedure: The patient was brought to the operating room by the anesthesia team. General  endotracheal anesthesia was induced. The patient was turned to the prone position on the Wilson frame. The patient's lumbosacral region was then prepared with Betadine scrub and Betadine solution. Sterile drapes were applied.  I then injected the area to be incised with Marcaine with epinephrine solution. I then used the scalpel to make a linear midline incision over the L2-3, L3-4 and L4-5 interspace. I then used electrocautery to perform a bilateral subperiosteal dissection exposing the spinous process and lamina of L2-3, L3-4 and L4-5 and to expose the old hardware from L3-L5 bilaterally.  We then inserted the Verstrac retractor to provide exposure.  We explored the fusion by removing the caps from the old pedicle screws, removing the cross connector, and removing the rod.  We inspect the arthrodesis at L3-4 and L4-5.  It appeared solid.  I began the decompression by using the high speed drill to perform laminotomies at L2-3 bilaterally, and L1-2 bilaterally. We then used the Kerrison punches to widen the laminotomy and removed the ligamentum flavum at L2-3 bilaterally. We used the Kerrison punches to remove the medial facets at L2-3 bilaterally. We performed wide foraminotomies about the bilateral L2 and L3 nerve roots completing the decompression.  We now turned our attention to the posterior lumbar interbody fusion. I used a scalpel to incise the intervertebral disc at L2-3 bilaterally. I then performed a partial intervertebral discectomy at L2-3 bilaterally using the pituitary forceps. We prepared the vertebral endplates at L2-3 bilaterally for the fusion by removing the soft tissues with the curettes. We then used the trial spacers to pick the appropriate sized interbody prosthesis. We prefilled his prosthesis with a combination of local morselized autograft bone that we obtained during the decompression as well as Zimmer DBM. We inserted the prefilled prosthesis into the interspace at L2-3  bilaterally, we then expanded  the prosthesis. There was a good snug fit of the prosthesis in the interspace. We then filled and the remainder of the intervertebral disc space with local morselized autograft bone and Zimmer DBM. This completed the posterior lumbar interbody arthrodesis.  We now turned attention to the instrumentation. Under fluoroscopic guidance we cannulated the bilateral L2 pedicles with the bone probe. We then removed the bone probe. We then tapped the pedicle with a 6.5 millimeter tap. We then removed the tap. We probed inside the tapped pedicle with a ball probe to rule out cortical breaches. We then inserted a 7.5 x 50 millimeter pedicle screw into the L2 pedicles bilaterally under fluoroscopic guidance. We then palpated along the medial aspect of the pedicles to rule out cortical breaches. There were none. The nerve roots were not injured. We then connected the unilateral pedicle screws with a lordotic rod. We compressed the construct and secured the rod in place with the caps.  We placed a cross connector between the rods.  We then tightened the caps appropriately. This completed the instrumentation from L2-L5 bilaterally.  We now turned our attention to the posterior lateral arthrodesis at L2-3 bilaterally. We used the high-speed drill to decorticate the remainder of the facets, pars, transverse process at L2-3 bilaterally. We then applied a combination of local morselized autograft bone and Zimmer DBM over these decorticated posterior lateral structures. This completed the posterior lateral arthrodesis.  We then obtained hemostasis using bipolar electrocautery. We irrigated the wound out with bacitracin solution. We inspected the thecal sac and nerve roots and noted they were well decompressed. We then removed the retractor. We reapproximated patient's thoracolumbar fascia with interrupted #1 Vicryl suture. We reapproximated patient's subcutaneous tissue with interrupted 2-0 Vicryl  suture. The reapproximated patient's skin with Steri-Strips and benzoin. The wound was then coated with bacitracin ointment. A sterile dressing was applied. The drapes were removed. The patient was subsequently returned to the supine position where they were extubated by the anesthesia team. He was then transported to the post anesthesia care unit in stable condition. All sponge instrument and needle counts were reportedly correct at the end of this case.

## 2019-06-05 NOTE — Progress Notes (Signed)
Subjective: The patient is somnolent but arousable.  She is in no apparent distress.  She looks well.  Objective: Vital signs in last 24 hours: Temp:  [97.8 F (36.6 C)-98.1 F (36.7 C)] 97.8 F (36.6 C) (08/05 1423) Pulse Rate:  [66-73] 73 (08/05 1423) Resp:  [18-20] (P) 18 (08/05 1423) BP: (118-172)/(58-71) 118/58 (08/05 1423) SpO2:  [100 %] 100 % (08/05 1423) Weight:  [96.6 kg] 96.6 kg (08/05 0806) Estimated body mass index is 31 kg/m as calculated from the following:   Height as of this encounter: 5' 9.5" (1.765 m).   Weight as of this encounter: 96.6 kg.   Intake/Output from previous day: No intake/output data recorded. Intake/Output this shift: Total I/O In: 1900 [I.V.:1400; IV Piggyback:500] Out: 1150 [Urine:950; Blood:200]  Physical exam the patient is somnolent but arousable.  She is moving her lower extremities well.  Lab Results: Recent Labs    06/03/19 1130  WBC 5.0  HGB 10.3*  HCT 33.5*  PLT 204   BMET Recent Labs    06/03/19 1130  NA 137  K 4.3  CL 105  CO2 23  GLUCOSE 145*  BUN 25*  CREATININE 1.35*  CALCIUM 9.0    Studies/Results: Dg Lumbar Spine 2-3 Views  Result Date: 06/05/2019 CLINICAL DATA:  Lumbar spine surgery. EXAM: LUMBAR SPINE - 2-3 VIEW; DG C-ARM 61-120 MIN COMPARISON:  Lumbar CT myelogram dated 04/27/2012 and lumbar spine MR dated 03/29/2012. Neither of those images have the levels labeled. FINDINGS: Review of the previous lumbar spine CT demonstrates 5 non-rib-bearing lumbar vertebrae. Therefore, the last open disc space is the L5-S1 level. There is interbody and pedicle screw placement at 3 levels with no rods seen. Grossly normal alignment at those levels, with poor visualization of the vertebral margins. The images do not include enough of the spine to determine the levels. IMPRESSION: Operative changes, as described above. Electronically Signed   By: Claudie Revering M.D.   On: 06/05/2019 13:52   Dg C-arm 1-60 Min  Result Date:  06/05/2019 CLINICAL DATA:  Lumbar spine surgery. EXAM: LUMBAR SPINE - 2-3 VIEW; DG C-ARM 61-120 MIN COMPARISON:  Lumbar CT myelogram dated 04/27/2012 and lumbar spine MR dated 03/29/2012. Neither of those images have the levels labeled. FINDINGS: Review of the previous lumbar spine CT demonstrates 5 non-rib-bearing lumbar vertebrae. Therefore, the last open disc space is the L5-S1 level. There is interbody and pedicle screw placement at 3 levels with no rods seen. Grossly normal alignment at those levels, with poor visualization of the vertebral margins. The images do not include enough of the spine to determine the levels. IMPRESSION: Operative changes, as described above. Electronically Signed   By: Claudie Revering M.D.   On: 06/05/2019 13:52    Assessment/Plan: The patient is doing well.  I spoke with her daughter.  LOS: 0 days     [name] Mathews 06/05/2019, 2:31 PM

## 2019-06-05 NOTE — Transfer of Care (Signed)
Immediate Anesthesia Transfer of Care Note  Patient: [name] Mathews  Procedure(s) Performed: POSTERIOR LUMBAR INTERBODY FUSION, INTERBODY PROSTHESIS, POSTERIOR INSTRUMENTATION,LUMBAR TWO- LUMBAR THREE,  EXPLORE FUSION, BILATERAL LUMBAR ONE-TWO LAMINOTOMY (N/A Back)  Patient Location: PACU  Anesthesia Type:General  Level of Consciousness: awake and patient cooperative  Airway & Oxygen Therapy: Patient Spontanous Breathing and Patient connected to face mask oxygen  Post-op Assessment: Report given to RN, Post -op Vital signs reviewed and stable and Patient moving all extremities X 4  Post vital signs: Reviewed and stable  Last Vitals:  Vitals Value Taken Time  BP 118/58 06/05/19 1423  Temp    Pulse 65 06/05/19 1423  Resp 5 06/05/19 1423  SpO2 100 % 06/05/19 1423  Vitals shown include unvalidated device data.  Last Pain:  Vitals:   06/05/19 0826  TempSrc:   PainSc: 5       Patients Stated Pain Goal: 3 (60/63/01 6010)  Complications: No apparent anesthesia complications

## 2019-06-05 NOTE — Anesthesia Preprocedure Evaluation (Addendum)
Anesthesia Evaluation  Patient identified by MRN, date of birth, ID band Patient awake    Reviewed: Allergy & Precautions, NPO status , Patient's Chart, lab work & pertinent test results  Airway Mallampati: II  TM Distance: >3 FB Neck ROM: Full    Dental  (+) Teeth Intact, Dental Advisory Given   Pulmonary neg pulmonary ROS,    breath sounds clear to auscultation       Cardiovascular hypertension, Pt. on medications + Peripheral Vascular Disease   Rhythm:Regular Rate:Normal     Neuro/Psych  Neuromuscular disease negative psych ROS   GI/Hepatic negative GI ROS, Neg liver ROS,   Endo/Other  diabetes, Type 2, Oral Hypoglycemic Agents  Renal/GU      Musculoskeletal  (+) Arthritis ,   Abdominal Normal abdominal exam  (+)   Peds  Hematology   Anesthesia Other Findings   Reproductive/Obstetrics                            Anesthesia Physical Anesthesia Plan  ASA: III  Anesthesia Plan: General   Post-op Pain Management:    Induction: Intravenous  PONV Risk Score and Plan: 4 or greater and Ondansetron and Treatment may vary due to age or medical condition  Airway Management Planned: Oral ETT  Additional Equipment: None  Intra-op Plan:   Post-operative Plan: Extubation in OR  Informed Consent: I have reviewed the patients History and Physical, chart, labs and discussed the procedure including the risks, benefits and alternatives for the proposed anesthesia with the patient or authorized representative who has indicated his/her understanding and acceptance.     Dental advisory given  Plan Discussed with: CRNA  Anesthesia Plan Comments:        Anesthesia Quick Evaluation

## 2019-06-05 NOTE — Anesthesia Postprocedure Evaluation (Signed)
Anesthesia Post Note  Patient: [name] Mathews  Procedure(s) Performed: POSTERIOR LUMBAR INTERBODY FUSION, INTERBODY PROSTHESIS, POSTERIOR INSTRUMENTATION,LUMBAR TWO- LUMBAR THREE,  EXPLORE FUSION, BILATERAL LUMBAR ONE-TWO LAMINOTOMY (N/A Back)     Patient location during evaluation: PACU Anesthesia Type: General Level of consciousness: awake and alert Pain management: pain level controlled Vital Signs Assessment: post-procedure vital signs reviewed and stable Respiratory status: spontaneous breathing, nonlabored ventilation, respiratory function stable and patient connected to nasal cannula oxygen Cardiovascular status: blood pressure returned to baseline and stable Postop Assessment: no apparent nausea or vomiting Anesthetic complications: no    Last Vitals:  Vitals:   06/05/19 1633 06/05/19 1702  BP: 120/64 139/60  Pulse: 64 62  Resp: 14 16  Temp:  36.8 C  SpO2: 98% 100%    Last Pain:  Vitals:   06/05/19 1702  TempSrc: Oral  PainSc:                  Effie Berkshire

## 2019-06-05 NOTE — H&P (Signed)
Subjective: The patient is an 85 year old black female on whom I performed a lumbar fusion.  She initially did well.  She is developed recurrent back and leg pain consistent with neurogenic claudication.  She has failed medical management.  She was worked up with a lumbar MRI which demonstrated stenosis and spondylolisthesis at L2-3.  I discussed the various treatment options with her including surgery.  She has decided to proceed with a lumbar decompression and fusion.  Past Medical History:  Diagnosis Date  . Anemia    hx of  . Arthritis   . Breast lump    hx of  . Cataract    left eye  . Chronic back pain   . Diabetes mellitus   . Hyperlipidemia   . Hypertension    sees Dr. Jarrett Ables, [facility] internal medicine  . Normochromic normocytic anemia 05/22/2017   Hb 10 MCV 85.6 No monoclonal spike on SPEP  Creatinine 1.3-1.6  . Peripheral vascular disease [facility])     Past Surgical History:  Procedure Laterality Date  . APPENDECTOMY    . BACK SURGERY    . BREAST CYST EXCISION Left   . BREAST SURGERY     cyst removed left side  . BUNIONECTOMY    . CARDIOVASCULAR STRESS TEST    . COLONOSCOPY    . DILATION AND CURETTAGE OF UTERUS    . TONSILLECTOMY      Allergies  Allergen Reactions  . Januvia [Sitagliptin] Other (See Comments)    Numbness in mouth  . Simvastatin Other (See Comments)    tremors    Social History   Tobacco Use  . Smoking status: Never Smoker  . Smokeless tobacco: Never Used  Substance Use Topics  . Alcohol use: No    History reviewed. No pertinent family history. Prior to Admission medications   Medication Sig Start Date End Date Taking? Authorizing Provider  acetaminophen (TYLENOL) 650 MG CR tablet Take 1,300 mg by mouth every 8 (eight) hours as needed for pain.   Yes [provider]  amLODipine (NORVASC) 10 MG tablet Take 10 mg by mouth daily.   Yes [provider]  aspirin 81 MG tablet Take 81 mg by mouth daily.   Yes [provider]  Cholecalciferol (VITAMIN D3) 5000 units TABS Take 1 tablet by mouth daily.   Yes [provider]  docusate sodium (COLACE) 250 MG capsule Take 250 mg by mouth daily.   Yes [provider]  Ferrous Sulfate (SLOW RELEASE IRON PO) Take 1 tablet by mouth daily.   Yes [provider]  fluticasone (FLONASE) 50 MCG/ACT nasal spray Place 1 spray into both nostrils daily as needed for allergies or rhinitis.   Yes [provider]  galantamine (RAZADYNE ER) 16 MG 24 hr capsule Take 16 mg by mouth every evening.   Yes [provider]  Lactobacillus-Inulin (PROBIOTIC DIGESTIVE SUPPORT PO) Take 2 each by mouth every morning.   Yes [provider]  linaclotide (LINZESS) 145 MCG CAPS capsule Take 145 mcg by mouth every other day.   Yes [provider]  metFORMIN (GLUCOPHAGE) 500 MG tablet Take 500 mg by mouth 2 (two) times daily with a meal.   Yes [provider]  Multiple Vitamins-Minerals (MULTIVITAMIN PO) Take 2 each by mouth daily.    Yes [provider]  OVER THE COUNTER MEDICATION Take 30 mLs by mouth daily. Black Seed Oil   Yes [provider]  pioglitazone (ACTOS) 30 MG tablet Take  30 mg by mouth daily.   Yes [provider]  rosuvastatin (CRESTOR) 5 MG tablet Take 5 mg by mouth daily.    Yes [provider]  telmisartan-hydrochlorothiazide (MICARDIS HCT) 40-12.5 MG tablet Take 1 tablet by mouth daily.   Yes [provider]  vitamin B-12 (CYANOCOBALAMIN) 1000 MCG tablet Take 1,000 mcg by mouth daily. OTC   Yes [provider]  vitamin C (ASCORBIC ACID) 500 MG tablet Take 500 mg by mouth daily.   Yes [provider]     Review of Systems  Positive ROS: As above  All other systems have been reviewed and were otherwise negative with the exception of those mentioned in the HPI and as above.  Objective: Vital signs in last 24 hours: Temp:  [98.1 F (36.7  C)] 98.1 F (36.7 C) (08/05 0806) Pulse Rate:  [66] 66 (08/05 0806) Resp:  [20] 20 (08/05 0806) BP: (172)/(71) 172/71 (08/05 0806) SpO2:  [100 %] 100 % (08/05 0806) Weight:  [96.6 kg] 96.6 kg (08/05 0806) Estimated body mass index is 31 kg/m as calculated from the following:   Height as of this encounter: 5' 9.5" (1.765 m).   Weight as of this encounter: 96.6 kg.   General Appearance: Alert Head: Normocephalic, without obvious abnormality, atraumatic Eyes: PERRL, conjunctiva/corneas clear, EOM's intact,    Ears: Normal  Throat: Normal  Neck: Supple, Back: Her lumbar incision is well-healed. Lungs: Clear to auscultation bilaterally, respirations unlabored Heart: Regular rate and rhythm, no murmur, rub or gallop Abdomen: Soft, non-tender Extremities: Extremities normal, atraumatic, no cyanosis or edema Skin: unremarkable  NEUROLOGIC:   Mental status: alert and oriented,Motor Exam - grossly normal Sensory Exam - grossly normal Reflexes:  Coordination - grossly normal Gait - grossly normal Balance - grossly normal Cranial Nerves: I: smell Not tested  II: visual acuity  OS: Normal  OD: Normal   II: visual fields Full to confrontation  II: pupils Equal, round, reactive to light  III,VII: ptosis None  III,IV,VI: extraocular muscles  Full ROM  V: mastication Normal  V: facial light touch sensation  Normal  V,VII: corneal reflex  Present  VII: facial muscle function - upper  Normal  VII: facial muscle function - lower Normal  VIII: hearing Not tested  IX: soft palate elevation  Normal  IX,X: gag reflex Present  XI: trapezius strength  5/5  XI: sternocleidomastoid strength 5/5  XI: neck flexion strength  5/5  XII: tongue strength  Normal    Data Review Lab Results  Component Value Date   WBC 5.0 06/03/2019   HGB 10.3 (L) 06/03/2019   HCT 33.5 (L) 06/03/2019   MCV 94.9 06/03/2019   PLT 204 06/03/2019   Lab Results  Component Value Date   NA 137 06/03/2019   K  4.3 06/03/2019   CL 105 06/03/2019   CO2 23 06/03/2019   BUN 25 (H) 06/03/2019   CREATININE 1.35 (H) 06/03/2019   GLUCOSE 145 (H) 06/03/2019   No results found for: INR, PROTIME  Assessment/Plan: L2-3 spondylolisthesis, spinal stenosis, lumbago, lumbar radiculopathy, neurogenic claudication: I have discussed the situation with the patient and her family.  We have reviewed the imaging studies I pointed out the abnormalities.  We have discussed the various treatment options including surgery.  I have described the surgical treatment option of an L2-3 decompression instrumentation and fusion with exploration of her old fusion.  I have shown her surgical models.  I given her surgical pamphlet.  We have  discussed the risks, benefits, alternatives, expected postoperative course, and likelihood of achieving our goals with surgery.  I have answered all her questions.  She has decided to proceed with surgery.   Cristi LoronJeffrey D Rahsaan Weakland 06/05/2019 10:11 AM

## 2019-06-05 NOTE — Anesthesia Procedure Notes (Addendum)
Procedure Name: Intubation Date/Time: 06/05/2019 10:29 AM Performed by: Effie Berkshire, MD Pre-anesthesia Checklist: Patient identified, Emergency Drugs available, Suction available and Patient being monitored Patient Re-evaluated:Patient Re-evaluated prior to induction Oxygen Delivery Method: Circle system utilized Preoxygenation: Pre-oxygenation with 100% oxygen Induction Type: IV induction Ventilation: Mask ventilation without difficulty Laryngoscope Size: Mac and 4 Grade View: Grade II Tube type: Oral Tube size: 7.5 mm Number of attempts: 2 Airway Equipment and Method: Stylet and Oral airway Placement Confirmation: ETT inserted through vocal cords under direct vision,  positive ETCO2 and breath sounds checked- equal and bilateral Secured at: 22 cm Tube secured with: Tape Dental Injury: Teeth and Oropharynx as per pre-operative assessment  Comments: 1st DL Dejah Droessler CRNA, MAC 4, grade 3 view, anterior anatomy, large epiglottis.  2nd DL Dr Smith Robert as noted above.  4x4s bite block used.

## 2019-06-06 LAB — CBC
HCT: 25.7 % — ABNORMAL LOW (ref 36.0–46.0)
Hemoglobin: 8.4 g/dL — ABNORMAL LOW (ref 12.0–15.0)
MCH: 30.3 pg (ref 26.0–34.0)
MCHC: 32.7 g/dL (ref 30.0–36.0)
MCV: 92.8 fL (ref 80.0–100.0)
Platelets: 184 10*3/uL (ref 150–400)
RBC: 2.77 MIL/uL — ABNORMAL LOW (ref 3.87–5.11)
RDW: 14.6 % (ref 11.5–15.5)
WBC: 7.8 10*3/uL (ref 4.0–10.5)
nRBC: 0 % (ref 0.0–0.2)

## 2019-06-06 LAB — BASIC METABOLIC PANEL
Anion gap: 13 (ref 5–15)
BUN: 28 mg/dL — ABNORMAL HIGH (ref 8–23)
CO2: 21 mmol/L — ABNORMAL LOW (ref 22–32)
Calcium: 8.5 mg/dL — ABNORMAL LOW (ref 8.9–10.3)
Chloride: 103 mmol/L (ref 98–111)
Creatinine, Ser: 1.57 mg/dL — ABNORMAL HIGH (ref 0.44–1.00)
GFR calc Af Amer: 35 mL/min — ABNORMAL LOW (ref >60)
GFR calc non Af Amer: 30 mL/min — ABNORMAL LOW (ref >60)
Glucose, Bld: 107 mg/dL — ABNORMAL HIGH (ref 70–99)
Potassium: 4.5 mmol/L (ref 3.5–5.1)
Sodium: 137 mmol/L (ref 135–145)

## 2019-06-06 LAB — HEMOGLOBIN A1C
Hgb A1c MFr Bld: 6.5 % — ABNORMAL HIGH (ref 4.8–5.6)
Mean Plasma Glucose: 139.85 mg/dL

## 2019-06-06 LAB — URINALYSIS, ROUTINE W REFLEX MICROSCOPIC
Bilirubin Urine: NEGATIVE
Glucose, UA: NEGATIVE mg/dL
Hgb urine dipstick: NEGATIVE
Ketones, ur: NEGATIVE mg/dL
Nitrite: NEGATIVE
Protein, ur: 30 mg/dL — AB
Specific Gravity, Urine: 1.019 (ref 1.005–1.030)
pH: 5 (ref 5.0–8.0)

## 2019-06-06 LAB — GLUCOSE, CAPILLARY
Glucose-Capillary: 148 mg/dL — ABNORMAL HIGH (ref 70–99)
Glucose-Capillary: 109 mg/dL — ABNORMAL HIGH (ref 70–99)
Glucose-Capillary: 143 mg/dL — ABNORMAL HIGH (ref 70–99)
Glucose-Capillary: 159 mg/dL — ABNORMAL HIGH (ref 70–99)
Glucose-Capillary: 183 mg/dL — ABNORMAL HIGH (ref 70–99)
Glucose-Capillary: 141 mg/dL — ABNORMAL HIGH (ref 70–99)

## 2019-06-06 MED FILL — Sodium Chloride IV Soln 0.9%: INTRAVENOUS | Qty: 1000 | Status: AC

## 2019-06-06 MED FILL — Heparin Sodium (Porcine) Inj 1000 Unit/ML: INTRAMUSCULAR | Qty: 30 | Status: AC

## 2019-06-06 NOTE — Evaluation (Signed)
Physical Therapy Evaluation Patient Details Name: [name] Mathews MRN: [id number] DOB: 02/25/1934 Today's Date: 06/06/2019   History of Present Illness  Pt is an 85 yo female s/p PLIF L2-3. interbody prosthesis explor fusion B/L L1-2 laminotomy.  Clinical Impression  Pt presents with deficits in strength and mobility and will benefit from skilled PT to improve functional independence and reduce burden of care. Anticipate pt requiring 24 hour assistance at d/c.    Follow Up Recommendations SNF;Supervision/Assistance - 24 hour    Equipment Recommendations  None recommended by PT    Recommendations for Other Services       Precautions / Restrictions Precautions Precautions: Back Precaution Booklet Issued: Yes (comment) Precaution Comments: verbally discussed precautions/handout Required Braces or Orthoses: Spinal Brace Spinal Brace: Lumbar corset;Applied in sitting position Restrictions Weight Bearing Restrictions: No      Mobility  Bed Mobility Overal bed mobility: Needs Assistance Bed Mobility: Rolling;Sidelying to Sit Rolling: Mod assist Sidelying to sit: Mod assist       General bed mobility comments: cues for technique, cues for LEs and trunk  Transfers Overall transfer level: Needs assistance   Transfers: Sit to/from Stand;Stand Pivot Transfers Sit to Stand: Mod assist Stand pivot transfers: Mod assist       General transfer comment: mod A for lifting from elevated bed.  cues each attempt for UE placement and safety with RW  Ambulation/Gait Ambulation/Gait assistance: Min assist Gait Distance (Feet): 20 Feet Assistive device: Rolling walker (2 wheeled)   Gait velocity: slow      Stairs            Wheelchair Mobility    Modified Rankin (Stroke Patients Only)       Balance Overall balance assessment: Mild deficits observed, not formally tested                                           Pertinent Vitals/Pain Pain  Assessment: No/denies pain Pain Score: 8  Pain Location: R thigh Pain Descriptors / Indicators: Discomfort Pain Intervention(s): Premedicated before session;Limited activity within patient's tolerance    Home Living Family/patient expects to be discharged to:: Private residence Living Arrangements: Children Available Help at Discharge: Available PRN/intermittently;Family Type of Home: House Home Access: Ramped entrance     Home Layout: Able to live on main level with bedroom/bathroom;Multi-level Home Equipment: Shower seat;Bedside commode;Walker - 4 wheels;Wheelchair - manual;Cane - single point Additional Comments: pt reports she uses a cane occasionally for mobility    Prior Function Level of Independence: Independent with assistive device(s)         Comments: Pt reports independence with ADL and assist for IADLs.     Hand Dominance   Dominant Hand: Left    Extremity/Trunk Assessment   Upper Extremity Assessment Upper Extremity Assessment: Generalized weakness    Lower Extremity Assessment Lower Extremity Assessment: Generalized weakness    Cervical / Trunk Assessment Cervical / Trunk Assessment: (LSO)  Communication   Communication: Receptive difficulties(slow processing)  Cognition Arousal/Alertness: Lethargic Behavior During Therapy: WFL for tasks assessed/performed Overall Cognitive Status: Impaired/Different from baseline Area of Impairment: Attention                               General Comments: slow response time and initiation      General Comments      Exercises  Assessment/Plan    PT Assessment Patient needs continued PT services  PT Problem List Decreased strength;Decreased activity tolerance;Decreased balance;Decreased mobility;Decreased knowledge of use of DME;Decreased knowledge of precautions       PT Treatment Interventions Balance training;DME instruction;Gait training;Neuromuscular re-education;Stair  training;Cognitive remediation;Functional mobility training;Patient/family education;Therapeutic activities;Therapeutic exercise;Modalities    PT Goals (Current goals can be found in the Care Plan section)  Acute Rehab PT Goals Patient Stated Goal: go home PT Goal Formulation: With patient Time For Goal Achievement: 06/20/19 Potential to Achieve Goals: Fair    Frequency Min 5X/week   Barriers to discharge Decreased caregiver support daughter works    Co-evaluation               AM-PAC PT "6 Clicks" Mobility  Outcome Measure Help needed turning from your back to your side while in a flat bed without using bedrails?: A Little Help needed moving from lying on your back to sitting on the side of a flat bed without using bedrails?: A Little Help needed moving to and from a bed to a chair (including a wheelchair)?: A Lot Help needed standing up from a chair using your arms (e.g., wheelchair or bedside chair)?: A Lot Help needed to walk in hospital room?: A Little Help needed climbing 3-5 steps with a railing? : A Lot 6 Click Score: 15    End of Session Equipment Utilized During Treatment: Back brace;Gait belt Activity Tolerance: Patient tolerated treatment well Patient left: in chair;with call bell/phone within reach Nurse Communication: Mobility status PT Visit Diagnosis: Muscle weakness (generalized) (M62.81);Difficulty in walking, not elsewhere classified (R26.2)    Time: [id number] PT Time Calculation (min) (ACUTE ONLY): 27 min   Charges:   PT Evaluation $PT Eval Moderate Complexity: 1 Mod PT Treatments $Self Care/Home Management: 8-22        Reggy EyeKaren Donawerth, PT, DPT  DONAWERTH,KAREN 06/06/2019, 10:27 AM

## 2019-06-06 NOTE — Progress Notes (Signed)
RN notified MD Arnoldo Morale about pt incision and honeycomb dressing saturated with blood. Nurse will continue to monitor patient.

## 2019-06-06 NOTE — Progress Notes (Signed)
Pt bladder scan 395 ml. I&O x1 350 ml removed. Verbal order for urinalysis and urine culture. Lannette Donath RN second verified.

## 2019-06-06 NOTE — Progress Notes (Signed)
Patient unable to ambulate due to pain at incision. RN attempted medicating her for about 3 times but she refused them all.  Sat at the bedside and dangled feet. Medicated patient and will try to ambulate her again.

## 2019-06-06 NOTE — Evaluation (Addendum)
Occupational Therapy Evaluation Patient Details Name: SHAHD OCCHIPINTI MRN: [id number] DOB: July 14, 1934 Today's Date: 06/06/2019    History of Present Illness Pt is an 85 yo female s/p PLIF L2-3. interbody prosthesis explore fusion B/L L1-2 laminotomy.   Clinical Impression   Pt PTA: living with daughter- caregiver from 9am to 4pm and family is there after 4pm. Pt reports independence with mobility and ADL. Pt was active with home health therapies as pt was having back pain. Pt currently limited by pain and poor mobility. Pt ambulating 20' x2 with RW and taking tiny steps with minguardA. Pt requiring cues to stand upright instead of leaning over. Pt transferring to [facility] with minA. Pt requires continued OT skilled services for ADL, mobility and safety. Next session to assist with LB dressing and standing for ADL. OT following acutely.     Follow Up Recommendations  Home health OT;Supervision/Assistance - 24 hour    Equipment Recommendations  3 in 1 bedside commode;Other (comment)(RW)    Recommendations for Other Services       Precautions / Restrictions Precautions Precautions: Back Precaution Booklet Issued: Yes (comment) Precaution Comments: verbally discussed precautions/handout Required Braces or Orthoses: Spinal Brace Spinal Brace: Lumbar corset Restrictions Weight Bearing Restrictions: No      Mobility Bed Mobility Overal bed mobility: Needs Assistance Bed Mobility: Rolling;Sidelying to Sit Rolling: Mod assist Sidelying to sit: Mod assist       General bed mobility comments: OOB in recliner  Transfers Overall transfer level: Needs assistance   Transfers: Sit to/from Stand Sit to Stand: Mod assist Stand pivot transfers: Min assist       General transfer comment: assist to avoid plopping for power-up    Balance Overall balance assessment: Mild deficits observed, not formally tested                                         ADL either performed  or assessed with clinical judgement   ADL Overall ADL's : Needs assistance/impaired Eating/Feeding: Set up;Sitting   Grooming: Set up;Sitting   Upper Body Bathing: Minimal assistance;Sitting   Lower Body Bathing: Moderate assistance;Sitting/lateral leans;Sit to/from stand;Cueing for safety;Cueing for sequencing   Upper Body Dressing : Minimal assistance;Sitting   Lower Body Dressing: Moderate assistance;Sitting/lateral leans;Sit to/from stand;Cueing for safety;Cueing for sequencing   Toilet Transfer: Minimal assistance;Ambulation;BSC   Toileting- Clothing Manipulation and Hygiene: Moderate assistance;Sitting/lateral lean;Sit to/from stand;Cueing for safety;Cueing for sequencing       Functional mobility during ADLs: Min guard;Rolling walker;Cueing for safety;Cueing for sequencing General ADL Comments: minA for UB ADL due to soreness s/p sx and modA for LB ADL.     Vision Baseline Vision/History: No visual deficits Vision Assessment?: No apparent visual deficits     Perception     Praxis      Pertinent Vitals/Pain Pain Assessment: 0-10 Pain Score: 4  Pain Location: R thigh Pain Descriptors / Indicators: Discomfort     Hand Dominance Left   Extremity/Trunk Assessment Upper Extremity Assessment Upper Extremity Assessment: Generalized weakness   Lower Extremity Assessment Lower Extremity Assessment: Generalized weakness RLE Deficits / Details: pain with mobility RLE Coordination: decreased gross motor   Cervical / Trunk Assessment Cervical / Trunk Assessment: Other exceptions Cervical / Trunk Exceptions: s/p back sx   Communication Communication Communication: Receptive difficulties(mild confusion, probably due to lethargy)   Cognition Arousal/Alertness: Lethargic Behavior During Therapy: WFL for tasks assessed/performed Overall  Cognitive Status: Impaired/Different from baseline Area of Impairment: Attention                                General Comments: Pt continued to ask "what do you mean?" after OT asking if she had a wheelchair at home.   General Comments  Pt very lethargic, but increased alertness when daughter came in room    Exercises     Shoulder Instructions      Home Living Family/patient expects to be discharged to:: Private residence Living Arrangements: Children(daughter) Available Help at Discharge: Available PRN/intermittently;Family Type of Home: House Home Access: Ramped entrance     Home Layout: Able to live on main level with bedroom/bathroom;Multi-level Alternate Level Stairs-Number of Steps: full flight   Bathroom Shower/Tub: Arts development officerWalk-in shower   Bathroom Toilet: Handicapped height     Home Equipment: Shower seat;Bedside commode;Walker - 4 wheels;Wheelchair - manual   Additional Comments: pt reports she uses a cane occasionally for mobility      Prior Functioning/Environment Level of Independence: Independent with assistive device(s)        Comments: Pt reports independence with ADL and assist for IADLs.        OT Problem List: Decreased strength;Decreased activity tolerance;Impaired balance (sitting and/or standing);Decreased safety awareness;Pain      OT Treatment/Interventions: Self-care/ADL training;Neuromuscular education;Therapeutic exercise;Energy conservation;Therapeutic activities;Patient/family education;Balance training    OT Goals(Current goals can be found in the care plan section) Acute Rehab OT Goals Patient Stated Goal: go home OT Goal Formulation: With patient Time For Goal Achievement: 06/20/19 Potential to Achieve Goals: Good ADL Goals Pt Will Perform Grooming: with supervision;standing Pt Will Perform Lower Body Dressing: with set-up;with adaptive equipment;sit to/from stand;sitting/lateral leans Pt Will Transfer to Toilet: with min guard assist;ambulating;bedside commode Pt Will Perform Toileting - Clothing Manipulation and hygiene: with min guard  assist;sitting/lateral leans;sit to/from stand Additional ADL Goal #1: Pt will perform OOB ADL with supervisionA for safety.  OT Frequency: Min 2X/week   Barriers to D/C:            Co-evaluation              AM-PAC OT "6 Clicks" Daily Activity     Outcome Measure Help from another person eating meals?: None Help from another person taking care of personal grooming?: A Little Help from another person toileting, which includes using toliet, bedpan, or urinal?: A Lot Help from another person bathing (including washing, rinsing, drying)?: A Lot Help from another person to put on and taking off regular upper body clothing?: A Little Help from another person to put on and taking off regular lower body clothing?: A Lot 6 Click Score: 16   End of Session Equipment Utilized During Treatment: Gait belt;Rolling walker Nurse Communication: Mobility status  Activity Tolerance: Patient limited by pain Patient left: in chair;with call bell/phone within reach;with chair alarm set  OT Visit Diagnosis: Unsteadiness on feet (R26.81);Muscle weakness (generalized) (M62.81)                Time: [id number] OT Time Calculation (min): 45 min Charges:  OT General Charges $OT Visit: 1 Visit OT Evaluation $OT Eval Moderate Complexity: 1 Mod OT Treatments $Self Care/Home Management : 8-22 mins $Therapeutic Activity: 8-22 mins  Revonda StandardAllison Cecil Cranker(Jelenek) [place] OTR/L Acute Rehabilitation Services Pager: [phone] Office: [phone]   Gunnar FusiLLISON J Athens Lebeau 06/06/2019, 1:27 PM

## 2019-06-06 NOTE — Progress Notes (Signed)
Subjective: The patient is alert and pleasant.  She looks well.  She is in no apparent distress.  Objective: Vital signs in last 24 hours: Temp:  [98 F (36.7 C)-100.2 F (37.9 C)] 99.5 F (37.5 C) (08/06 1128) Pulse Rate:  [62-91] 83 (08/06 1128) Resp:  [15-19] 16 (08/06 1128) BP: (120-139)/(52-61) 127/61 (08/06 1128) SpO2:  [93 %-100 %] 97 % (08/06 1128) Estimated body mass index is 31 kg/m as calculated from the following:   Height as of this encounter: 5' 9.5" (1.765 m).   Weight as of this encounter: 96.6 kg.   Intake/Output from previous day: 08/05 0701 - 08/06 0700 In: 2533.2 [P.O.:240; I.V.:1593.2; IV Piggyback:700] Out: 1850 [Urine:1650; Blood:200] Intake/Output this shift: No intake/output data recorded.  Physical exam the patient is alert and pleasant.  She is moving her lower extremities well.  Lab Results: Recent Labs    06/06/19 0350  WBC 7.8  HGB 8.4*  HCT 25.7*  PLT 184   BMET Recent Labs    06/06/19 0350  NA 137  K 4.5  CL 103  CO2 21*  GLUCOSE 107*  BUN 28*  CREATININE 1.57*  CALCIUM 8.5*    Studies/Results: Dg Lumbar Spine 2-3 Views  Result Date: 06/05/2019 CLINICAL DATA:  Lumbar spine surgery. EXAM: LUMBAR SPINE - 2-3 VIEW; DG C-ARM 61-120 MIN COMPARISON:  Lumbar CT myelogram dated 04/27/2012 and lumbar spine MR dated 03/29/2012. Neither of those images have the levels labeled. FINDINGS: Review of the previous lumbar spine CT demonstrates 5 non-rib-bearing lumbar vertebrae. Therefore, the last open disc space is the L5-S1 level. There is interbody and pedicle screw placement at 3 levels with no rods seen. Grossly normal alignment at those levels, with poor visualization of the vertebral margins. The images do not include enough of the spine to determine the levels. IMPRESSION: Operative changes, as described above. Electronically Signed   By: Claudie Revering M.D.   On: 06/05/2019 13:52   Dg C-arm 1-60 Min  Result Date: 06/05/2019 CLINICAL DATA:   Lumbar spine surgery. EXAM: LUMBAR SPINE - 2-3 VIEW; DG C-ARM 61-120 MIN COMPARISON:  Lumbar CT myelogram dated 04/27/2012 and lumbar spine MR dated 03/29/2012. Neither of those images have the levels labeled. FINDINGS: Review of the previous lumbar spine CT demonstrates 5 non-rib-bearing lumbar vertebrae. Therefore, the last open disc space is the L5-S1 level. There is interbody and pedicle screw placement at 3 levels with no rods seen. Grossly normal alignment at those levels, with poor visualization of the vertebral margins. The images do not include enough of the spine to determine the levels. IMPRESSION: Operative changes, as described above. Electronically Signed   By: Claudie Revering M.D.   On: 06/05/2019 13:52    Assessment/Plan: Postop day 1: The patient is doing well.  I spoke with her daughter.  Elevated creatinine: I will repeat her BMP tomorrow.  Acute blood loss anemia: I will repeat her CBC tomorrow.  She is on  iron.  Urinary retention: She is getting INO cath.  LOS: 1 day     Ophelia Charter 06/06/2019, 4:44 PM

## 2019-06-07 LAB — CBC WITH DIFFERENTIAL/PLATELET
Abs Immature Granulocytes: 0.05 10*3/uL (ref 0.00–0.07)
Basophils Absolute: 0 10*3/uL (ref 0.0–0.1)
Basophils Relative: 0 %
Eosinophils Absolute: 0 10*3/uL (ref 0.0–0.5)
Eosinophils Relative: 0 %
HCT: 24.6 % — ABNORMAL LOW (ref 36.0–46.0)
Hemoglobin: 8.1 g/dL — ABNORMAL LOW (ref 12.0–15.0)
Immature Granulocytes: 1 %
Lymphocytes Relative: 11 %
Lymphs Abs: 1.1 10*3/uL (ref 0.7–4.0)
MCH: 29.6 pg (ref 26.0–34.0)
MCHC: 32.9 g/dL (ref 30.0–36.0)
MCV: 89.8 fL (ref 80.0–100.0)
Monocytes Absolute: 1.2 10*3/uL — ABNORMAL HIGH (ref 0.1–1.0)
Monocytes Relative: 12 %
Neutro Abs: 7.7 10*3/uL (ref 1.7–7.7)
Neutrophils Relative %: 76 %
Platelets: 149 10*3/uL — ABNORMAL LOW (ref 150–400)
RBC: 2.74 MIL/uL — ABNORMAL LOW (ref 3.87–5.11)
RDW: 14.7 % (ref 11.5–15.5)
WBC: 10.1 10*3/uL (ref 4.0–10.5)
nRBC: 0 % (ref 0.0–0.2)

## 2019-06-07 LAB — BASIC METABOLIC PANEL
Anion gap: 12 (ref 5–15)
BUN: 32 mg/dL — ABNORMAL HIGH (ref 8–23)
CO2: 20 mmol/L — ABNORMAL LOW (ref 22–32)
Calcium: 8.5 mg/dL — ABNORMAL LOW (ref 8.9–10.3)
Chloride: 100 mmol/L (ref 98–111)
Creatinine, Ser: 1.79 mg/dL — ABNORMAL HIGH (ref 0.44–1.00)
GFR calc Af Amer: 30 mL/min — ABNORMAL LOW (ref >60)
GFR calc non Af Amer: 26 mL/min — ABNORMAL LOW (ref >60)
Glucose, Bld: 151 mg/dL — ABNORMAL HIGH (ref 70–99)
Potassium: 3.9 mmol/L (ref 3.5–5.1)
Sodium: 132 mmol/L — ABNORMAL LOW (ref 135–145)

## 2019-06-07 LAB — GLUCOSE, CAPILLARY
Glucose-Capillary: 143 mg/dL — ABNORMAL HIGH (ref 70–99)
Glucose-Capillary: 150 mg/dL — ABNORMAL HIGH (ref 70–99)
Glucose-Capillary: 159 mg/dL — ABNORMAL HIGH (ref 70–99)
Glucose-Capillary: 162 mg/dL — ABNORMAL HIGH (ref 70–99)
Glucose-Capillary: 186 mg/dL — ABNORMAL HIGH (ref 70–99)
Glucose-Capillary: 199 mg/dL — ABNORMAL HIGH (ref 70–99)

## 2019-06-07 LAB — URINE CULTURE: Culture: NO GROWTH

## 2019-06-07 MED ORDER — CEPHALEXIN 500 MG PO CAPS
500.0000 mg | ORAL_CAPSULE | Freq: Two times a day (BID) | ORAL | Status: DC
  Administered 2019-06-07 – 2019-06-10 (×7): 500 mg via ORAL
  Filled 2019-06-07 (×7): qty 1

## 2019-06-07 NOTE — Progress Notes (Signed)
Bladder scan 460, patient voided 120. Educated drinking more fluid and RN will attempt voiding trial again. Also she did ambulate about 3ft with the a walker and her brace. Tolerated it well.

## 2019-06-07 NOTE — Progress Notes (Signed)
Subjective:  The patient is alert and pleasant.  She is slightly confused this morning.  She is in no apparent distress.  She wants to go home.  Objective: Vital signs in last 24 hours: Temp:  [98.4 F (36.9 C)-100.2 F (37.9 C)] 98.4 F (36.9 C) (08/07 0311) Pulse Rate:  [83-96] 84 (08/07 0311) Resp:  [15-18] 17 (08/07 0311) BP: (109-158)/(50-63) 120/58 (08/07 0311) SpO2:  [93 %-97 %] 94 % (08/07 0311) Estimated body mass index is 31 kg/m as calculated from the following:   Height as of this encounter: 5' 9.5" (1.765 m).   Weight as of this encounter: 96.6 kg.   Intake/Output from previous day: 08/06 0701 - 08/07 0700 In: -  Out: 100 [Urine:100] Intake/Output this shift: Total I/O In: -  Out: 100 [Urine:100]  Physical exam   The patient is alert and pleasant.  She is slightly confused.  She is moving her lower extremities well.  Lab Results: Recent Labs   06/06/19 0350 06/07/19 0544 WBC 7.8 10.1 HGB 8.4* 8.1* HCT 25.7* 24.6* PLT 184 149*  BMET Recent Labs   06/06/19 0350 NA 137 K 4.5 CL 103 CO2 21* GLUCOSE 107* BUN 28* CREATININE 1.57* CALCIUM 8.5*   Studies/Results: Dg Lumbar Spine 2-3 Views  Result Date: 06/05/2019 CLINICAL DATA:  Lumbar spine surgery. EXAM: LUMBAR SPINE - 2-3 VIEW; DG C-ARM 61-120 MIN COMPARISON:  Lumbar CT myelogram dated 04/27/2012 and lumbar spine MR dated 03/29/2012. Neither of those images have the levels labeled. FINDINGS: Review of the previous lumbar spine CT demonstrates 5 non-rib-bearing lumbar vertebrae. Therefore, the last open disc space is the L5-S1 level. There is interbody and pedicle screw placement at 3 levels with no rods seen. Grossly normal alignment at those levels, with poor visualization of the vertebral margins. The images do not include enough of the spine to determine the levels. IMPRESSION: Operative changes, as described above. Electronically Signed   By: Claudie Revering M.D.   On: 06/05/2019 13:52   Dg C-arm  1-60 Min  Result Date: 06/05/2019 CLINICAL DATA:  Lumbar spine surgery. EXAM: LUMBAR SPINE - 2-3 VIEW; DG C-ARM 61-120 MIN COMPARISON:  Lumbar CT myelogram dated 04/27/2012 and lumbar spine MR dated 03/29/2012. Neither of those images have the levels labeled. FINDINGS: Review of the previous lumbar spine CT demonstrates 5 non-rib-bearing lumbar vertebrae. Therefore, the last open disc space is the L5-S1 level. There is interbody and pedicle screw placement at 3 levels with no rods seen. Grossly normal alignment at those levels, with poor visualization of the vertebral margins. The images do not include enough of the spine to determine the levels. IMPRESSION: Operative changes, as described above. Electronically Signed   By: Claudie Revering M.D.   On: 06/05/2019 13:52    Assessment/Plan:   Postop day 2.:  The patient is doing well.  She will likely go home later on today after she works with PT and OT.  Chronic and acute blood-loss anemia:  The patient is on iron.  Her repeat hemoglobin say 0.1.  She is asymptomatic.  Increased creatinine:  Her repeat BMP is pending.  LOS: 2 days     Ophelia Charter 06/07/2019, 6:55 AM

## 2019-06-07 NOTE — Progress Notes (Addendum)
Providing Compassionate, Quality Care - Together   Subjective: Patient's daughter is at the bedside. She reports her mother has a slightly altered mental status with delayed responses and mild confusion. She doesn't know if this is medication related or due to a urinary tract infection. Ms. Cedillo denies pain. Nurse reports bloody drainage from incision has slowed since yesterday. Patient is still with urinary retention. Her foley catheter was removed at 0600 this morning and patient has been unable to void since. Nurse reports they will bladder scan again at noon.  Objective: Vital signs in last 24 hours: Temp:  [98 F (36.7 C)-100 F (37.8 C)] 98 F (36.7 C) (08/07 0901) Pulse Rate:  [83-96] 88 (08/07 0901) Resp:  [16-22] 22 (08/07 0901) BP: (109-158)/(50-63) 138/59 (08/07 0901) SpO2:  [93 %-97 %] 96 % (08/07 0901)  Intake/Output from previous day: 08/06 0701 - 08/07 0700 In: -  Out: 100 [Urine:100] Intake/Output this shift: No intake/output data recorded.  Alert and oriented to self and place PERRLA MAE, Strength 3/5 BLE CN II-XII grossly intact Incision with small amount of sanguinous drainage on honeycomb dressing   Lab Results: Recent Labs    06/06/19 0350 06/07/19 0544  WBC 7.8 10.1  HGB 8.4* 8.1*  HCT 25.7* 24.6*  PLT 184 149*   BMET Recent Labs    06/06/19 0350 06/07/19 0544  NA 137 132*  K 4.5 3.9  CL 103 100  CO2 21* 20*  GLUCOSE 107* 151*  BUN 28* 32*  CREATININE 1.57* 1.79*  CALCIUM 8.5* 8.5*    Studies/Results: Dg Lumbar Spine 2-3 Views  Result Date: 06/05/2019 CLINICAL DATA:  Lumbar spine surgery. EXAM: LUMBAR SPINE - 2-3 VIEW; DG C-ARM 61-120 MIN COMPARISON:  Lumbar CT myelogram dated 04/27/2012 and lumbar spine MR dated 03/29/2012. Neither of those images have the levels labeled. FINDINGS: Review of the previous lumbar spine CT demonstrates 5 non-rib-bearing lumbar vertebrae. Therefore, the last open disc space is the L5-S1 level. There  is interbody and pedicle screw placement at 3 levels with no rods seen. Grossly normal alignment at those levels, with poor visualization of the vertebral margins. The images do not include enough of the spine to determine the levels. IMPRESSION: Operative changes, as described above. Electronically Signed   By: Claudie Revering M.D.   On: 06/05/2019 13:52   Dg C-arm 1-60 Min  Result Date: 06/05/2019 CLINICAL DATA:  Lumbar spine surgery. EXAM: LUMBAR SPINE - 2-3 VIEW; DG C-ARM 61-120 MIN COMPARISON:  Lumbar CT myelogram dated 04/27/2012 and lumbar spine MR dated 03/29/2012. Neither of those images have the levels labeled. FINDINGS: Review of the previous lumbar spine CT demonstrates 5 non-rib-bearing lumbar vertebrae. Therefore, the last open disc space is the L5-S1 level. There is interbody and pedicle screw placement at 3 levels with no rods seen. Grossly normal alignment at those levels, with poor visualization of the vertebral margins. The images do not include enough of the spine to determine the levels. IMPRESSION: Operative changes, as described above. Electronically Signed   By: Claudie Revering M.D.   On: 06/05/2019 13:52    Assessment/Plan: Ms. Fogarty is 2 days s/p L2-3 decompression and fusion. She has not been able to urinate since her urinary catheter was removed. Her creatinine increased to 1.79 overnight from 1.57 yesterday. PT and OT recommending SNF at discharge.   LOS: 2 days    -Continue to mobilize as tolerated -Encourage up to [place] void residual if patient is able to urinate -  Repeat BMP in the AM -Keflex started due to wound drainage  Val EagleMeghan Quinnie Barcelo, DNP, AGNP-C Nurse Practitioner  [facility] [address], [address], [place], [place] [address] P: [phone]     F: [phone]  06/07/2019, 10:32 AM

## 2019-06-07 NOTE — Progress Notes (Addendum)
Physical Therapy Treatment Patient Details Name: [name] Mathews MRN: [id number] DOB: July 21, 1934 Today's Date: 06/07/2019    History of Present Illness Pt is an 85 yo female s/p PLIF L2-3, interbody prosthesis explore fusion B/L L1-2 laminotomy. PMH including but not limited to DM, HTN and PVD.    PT Comments    Pt very lethargic this session and very slow to respond to any verbal stimuli or questions from therapist. Pt also requiring heavy physical assistance for all mobility and unable to ambulate this session. Pt's RN was notified. Pt would continue to benefit from skilled physical therapy services at this time while admitted and after d/c to address the below listed limitations in order to improve overall safety and independence with functional mobility.    Follow Up Recommendations  SNF;Supervision/Assistance - 24 hour     Equipment Recommendations  None recommended by PT    Recommendations for Other Services       Precautions / Restrictions Precautions Precautions: Back Precaution Comments: reviewed 3/3 back precautions and log roll technique with pt throughout Required Braces or Orthoses: Spinal Brace Spinal Brace: Lumbar corset;Applied in sitting position Restrictions Weight Bearing Restrictions: No    Mobility  Bed Mobility Overal bed mobility: Needs Assistance Bed Mobility: Rolling;Sidelying to Sit Rolling: Mod assist Sidelying to sit: Mod assist       General bed mobility comments: increased time and effort, multimodal cueing required, use of bed pads to roll hips into sidelying and for positioning at EOB  Transfers Overall transfer level: Needs assistance Equipment used: Rolling walker (2 wheeled) Transfers: Sit to/from [employer] Transfers Sit to Stand: From elevated surface;Max assist Stand pivot transfers: Mod assist       General transfer comment: greatly increased time and effort, with multimodal cueing and max verbal engagement to attend to  task; heavy physical assistance needed to power into standing from EOB  Ambulation/Gait             General Gait Details: unable   Stairs             Wheelchair Mobility    Modified Rankin (Stroke Patients Only)       Balance Overall balance assessment: Needs assistance Sitting-balance support: Feet supported Sitting balance-Leahy Scale: Fair     Standing balance support: Bilateral upper extremity supported Standing balance-Leahy Scale: Poor                              Cognition Arousal/Alertness: Lethargic Behavior During Therapy: Flat affect Overall Cognitive Status: Impaired/Different from baseline Area of Impairment: Attention;Following commands;Problem solving                   Current Attention Level: Focused   Following Commands: Follows one step commands inconsistently;Follows one step commands with increased time     Problem Solving: Slow processing;Decreased initiation;Difficulty sequencing;Requires verbal cues;Requires tactile cues        Exercises      General Comments        Pertinent Vitals/Pain Pain Assessment: Faces Faces Pain Scale: No hurt    Home Living                      Prior Function            PT Goals (current goals can now be found in the care plan section) Acute Rehab PT Goals PT Goal Formulation: With patient Time For Goal Achievement: 06/20/19  Potential to Achieve Goals: Fair Progress towards PT goals: Progressing toward goals    Frequency    Min 5X/week      PT Plan Current plan remains appropriate    Co-evaluation              AM-PAC PT "6 Clicks" Mobility   Outcome Measure  Help needed turning from your back to your side while in a flat bed without using bedrails?: A Lot Help needed moving from lying on your back to sitting on the side of a flat bed without using bedrails?: A Lot Help needed moving to and from a bed to a chair (including a wheelchair)?: A  Lot Help needed standing up from a chair using your arms (e.g., wheelchair or bedside chair)?: A Lot Help needed to walk in hospital room?: A Lot Help needed climbing 3-5 steps with a railing? : Total 6 Click Score: 11    End of Session Equipment Utilized During Treatment: Back brace;Gait belt Activity Tolerance: Patient limited by lethargy Patient left: in chair;with call bell/phone within reach Nurse Communication: Mobility status PT Visit Diagnosis: Muscle weakness (generalized) (M62.81);Difficulty in walking, not elsewhere classified (R26.2)     Time: [id number] PT Time Calculation (min) (ACUTE ONLY): 24 min  Charges:  $Therapeutic Activity: 23-37 mins                     Deborah ChalkJennifer Javius Sylla, [place]PT, DPT  Acute Rehabilitation Services Pager [phone] Office [phone]     Alessandra BevelsJennifer M Yvana Samonte 06/07/2019, 10:30 AM

## 2019-06-07 NOTE — [place] FL2 (Signed)
MEDICAID FL2 LEVEL OF CARE SCREENING TOOL     IDENTIFICATION  Patient Name: [name] Mathews Birthdate: December 16, 1933 Sex: female Admission Date (Current Location): 06/05/2019  [facility] and [place] Number:  Herbalist and Address:  [facility] [place], [place] [address] [place], [place] [address]      Provider Number: [id number]  Attending Physician Name and Address:  [name], MD  Relative Name and Phone Number:  Louretta Shorten; daughter, [phone]    Current Level of Care: Hospital Recommended Level of Care: [place] Prior Approval Number:    Date Approved/Denied:   PASRR Number: [id number]  Discharge Plan: SNF    Current Diagnoses: Patient Active Problem List   Diagnosis Date Noted  . Lumbar adjacent segment disease with spondylolisthesis 06/05/2019  . Hypersomnia with long sleep time, idiopathic 01/17/2018  . Snoring 01/17/2018  . Sleep apnea with hypersomnolence 01/17/2018  . Excessive daytime sleepiness 01/17/2018  . Normochromic normocytic anemia 05/22/2017  . Radiculopathy 07/11/2012  . HTN (hypertension) 07/08/2012  . Diabetes mellitus ([place]) 07/08/2012  . Encephalopathy acute 07/08/2012  . Tachycardia 07/08/2012  . UTI (lower urinary tract infection) 07/08/2012    Orientation RESPIRATION BLADDER Height & Weight     Self, Time, Situation, Place  Normal Continent Weight: 213 lb (96.6 kg) Height:  5' 9.5" (176.5 cm)  BEHAVIORAL SYMPTOMS/MOOD NEUROLOGICAL BOWEL NUTRITION STATUS      Continent Diet(see discharge summary)  AMBULATORY STATUS COMMUNICATION OF NEEDS Skin   Extensive Assist Verbally Surgical wounds(incision on back with honeycomb dressing to be changed PRN)                       Personal Care Assistance Level of Assistance  Bathing, Dressing, Feeding Bathing Assistance: Maximum assistance Feeding assistance: Limited assistance Dressing Assistance: Maximum assistance      Functional Limitations Info  Sight, Hearing, Speech Sight Info: Adequate Hearing Info: Adequate Speech Info: Adequate    SPECIAL CARE FACTORS FREQUENCY  OT (By licensed OT), PT (By licensed PT)     PT Frequency: 5x week OT Frequency: 5x week            Contractures Contractures Info: Not present    Additional Factors Info  Code Status, Allergies, Insulin Sliding Scale Code Status Info: Full Code Allergies Info: Januvia (Sitagliptin),Simvastatin   Insulin Sliding Scale Info: insulin aspart (novoLOG) injection 0-20 Units every 4 hrs; metFORMIN (GLUCOPHAGE) tablet 500 mg 2x daily with meals       Current Medications (06/07/2019):  This is the current hospital active medication list Current Facility-Administered Medications  Medication Dose Route Frequency Provider Last Rate Last Dose  . 0.9 %  sodium chloride infusion  250 mL Intravenous Continuous [name], MD 1 mL/hr at 06/05/19 2003 250 mL at 06/05/19 2003  . acetaminophen (TYLENOL) tablet 650 mg  650 mg Oral Q4H PRN [name], MD   650 mg at 06/06/19 2327   Or  . acetaminophen (TYLENOL) suppository 650 mg  650 mg Rectal Q4H PRN [name], MD      . amLODipine (NORVASC) tablet 10 mg  10 mg Oral Daily [name], MD   10 mg at 06/07/19 1055  . bisacodyl (DULCOLAX) suppository 10 mg  10 mg Rectal Daily PRN [name], MD      . bupivacaine liposome (EXPAREL) 1.3 % injection 266 mg  20 mL Infiltration Once [name], MD      . cephALEXin [facility]) capsule  500 mg  500 mg Oral Q6H Bergman, Meghan D, NP      . cyclobenzaprine (FLEXERIL) tablet 10 mg  10 mg Oral TID PRN Tressie StalkerJenkins, Jeffrey, MD      . docusate sodium (COLACE) capsule 250 mg  250 mg Oral Daily Tressie StalkerJenkins, Jeffrey, MD   250 mg at 06/07/19 1054  . ferrous sulfate tablet 325 mg  325 mg Oral Q breakfast Tressie StalkerJenkins, Jeffrey, MD   325 mg at 06/07/19 0824  . fluticasone (FLONASE) 50 MCG/ACT nasal spray 1 spray  1 spray Each Nare Daily PRN  Tressie StalkerJenkins, Jeffrey, MD      . galantamine (RAZADYNE ER) 24 hr capsule 16 mg  16 mg Oral QPM Tressie StalkerJenkins, Jeffrey, MD   16 mg at 06/06/19 1833  . irbesartan (AVAPRO) tablet 150 mg  150 mg Oral Daily Tressie StalkerJenkins, Jeffrey, MD   150 mg at 06/07/19 1055   And  . hydrochlorothiazide (MICROZIDE) capsule 12.5 mg  12.5 mg Oral Daily Tressie StalkerJenkins, Jeffrey, MD   12.5 mg at 06/07/19 1055  . insulin aspart (novoLOG) injection 0-20 Units  0-20 Units Subcutaneous Q4H Tressie StalkerJenkins, Jeffrey, MD   4 Units at 06/07/19 1212  . lactated ringers infusion   Intravenous Continuous Shelton SilvasHollis, Kevin D, MD 10 mL/hr at 06/05/19 [id number]    . linaclotide (LINZESS) capsule 145 mcg  145 mcg Oral Hubbard RobinsonQODAY Jenkins, Jeffrey, MD   145 mcg at 06/07/19 1054  . menthol-cetylpyridinium (CEPACOL) lozenge 3 mg  1 lozenge Oral PRN Tressie StalkerJenkins, Jeffrey, MD       Or  . phenol [facility](CHLORASEPTIC) mouth spray 1 spray  1 spray Mouth/Throat PRN Tressie StalkerJenkins, Jeffrey, MD   1 spray at 06/05/19 1946  . metFORMIN (GLUCOPHAGE) tablet 500 mg  500 mg Oral BID WC Tressie StalkerJenkins, Jeffrey, MD   500 mg at 06/07/19 0824  . morphine 4 MG/ML injection 4 mg  4 mg Intravenous Q2H PRN Tressie StalkerJenkins, Jeffrey, MD   4 mg at 06/06/19 0414  . multivitamin with minerals tablet   Oral Daily Tressie StalkerJenkins, Jeffrey, MD   1 tablet at 06/07/19 1055  . ondansetron (ZOFRAN) tablet 4 mg  4 mg Oral Q6H PRN Tressie StalkerJenkins, Jeffrey, MD       Or  . ondansetron [facility](ZOFRAN) injection 4 mg  4 mg Intravenous Q6H PRN Tressie StalkerJenkins, Jeffrey, MD      . oxyCODONE (Oxy IR/ROXICODONE) immediate release tablet 10 mg  10 mg Oral Q3H PRN Tressie StalkerJenkins, Jeffrey, MD      . oxyCODONE (Oxy IR/ROXICODONE) immediate release tablet 5 mg  5 mg Oral Q3H PRN Tressie StalkerJenkins, Jeffrey, MD   5 mg at 06/06/19 [id number]  . pioglitazone (ACTOS) tablet 30 mg  30 mg Oral Daily Tressie StalkerJenkins, Jeffrey, MD   30 mg at 06/07/19 1054  . rosuvastatin (CRESTOR) tablet 5 mg  5 mg Oral Daily Tressie StalkerJenkins, Jeffrey, MD   5 mg at 06/07/19 1055  . sodium chloride flush (NS) 0.9 % injection 3 mL  3 mL Intravenous Q12H Tressie StalkerJenkins,  Jeffrey, MD   3 mL at 06/07/19 1055  . sodium chloride flush (NS) 0.9 % injection 3 mL  3 mL Intravenous PRN Tressie StalkerJenkins, Jeffrey, MD      . zolpidem [facility](AMBIEN) tablet 5 mg  5 mg Oral QHS PRN Tressie StalkerJenkins, Jeffrey, MD         Discharge Medications: Please see discharge summary for a list of discharge medications.  Relevant Imaging Results:  Relevant Lab Results:   Additional Information SS#[id number]  Mariyah Upshaw H [place], [place]

## 2019-06-07 NOTE — Progress Notes (Signed)
Pt unable to void for 6 hours, bladder scan revealed 525cc urine. In and out cath 500cc clear yellow urine with some sediment. Post cath bladder scan revealed <14cc remaining. Will continue to encourage fluids and voiding.

## 2019-06-07 NOTE — Progress Notes (Addendum)
Honeycomb dressing on back saturated with blood while ambulating to bedside commode. Dressing changed.  Pt unable to urinate on bedside commode, bladder scan shows 477cc. This patient has been in and out cathed 3 times over yesterday evening through today.    Message left with [place] neurosurgery service. Awaiting orders.

## 2019-06-07 NOTE — Care Management Important Message (Signed)
Important Message  Patient Details  Name: [name] Mathews MRN: [id number] Date of Birth: [birth year]   Medicare Important Message Given:  Yes     [name] [name] 06/07/2019, 3:49 PM

## 2019-06-07 NOTE — Progress Notes (Signed)
Inpatient Rehabilitation Admissions Coordinator  If patient continues to make slow progress over the weekend, recommend consider an inpt rehab consult for noted pt has 24/7 caregiver support at home as well as some postoperative issues. Please advise.  Danne Baxter, RN, MSN Rehab Admissions Coordinator [phone] 06/07/2019 12:19 PM

## 2019-06-07 NOTE — Progress Notes (Signed)
The patient continues to have urinary retention and required an in and out catheterization this morning.  Her creatinine has increased to 1.79 and she is mildly hyponatremic. I will plan to repeat her BMP again tomorrow.  If her creatinine continues to raise we may consider a nephrology consult.

## 2019-06-07 NOTE — Progress Notes (Signed)
Patient continues to retain urine after foley removed 24 hours. Bladder scan at 0655 and yielded 534. Straight catheter yielded 680. Will pass it on to incoming staff.

## 2019-06-08 LAB — BASIC METABOLIC PANEL
Anion gap: 12 (ref 5–15)
BUN: 26 mg/dL — ABNORMAL HIGH (ref 8–23)
CO2: 23 mmol/L (ref 22–32)
Calcium: 8.7 mg/dL — ABNORMAL LOW (ref 8.9–10.3)
Chloride: 99 mmol/L (ref 98–111)
Creatinine, Ser: 1.36 mg/dL — ABNORMAL HIGH (ref 0.44–1.00)
GFR calc Af Amer: 42 mL/min — ABNORMAL LOW (ref >60)
GFR calc non Af Amer: 36 mL/min — ABNORMAL LOW (ref >60)
Glucose, Bld: 108 mg/dL — ABNORMAL HIGH (ref 70–99)
Potassium: 3.8 mmol/L (ref 3.5–5.1)
Sodium: 134 mmol/L — ABNORMAL LOW (ref 135–145)

## 2019-06-08 LAB — GLUCOSE, CAPILLARY
Glucose-Capillary: 105 mg/dL — ABNORMAL HIGH (ref 70–99)
Glucose-Capillary: 123 mg/dL — ABNORMAL HIGH (ref 70–99)
Glucose-Capillary: 130 mg/dL — ABNORMAL HIGH (ref 70–99)
Glucose-Capillary: 150 mg/dL — ABNORMAL HIGH (ref 70–99)
Glucose-Capillary: 164 mg/dL — ABNORMAL HIGH (ref 70–99)
Glucose-Capillary: 171 mg/dL — ABNORMAL HIGH (ref 70–99)

## 2019-06-08 NOTE — Progress Notes (Signed)
Patient ID: [name] Mathews, female   DOB: 03-01-1934, 85 y.o.   MRN: [id number] Vital signs are stable Motor function is intact in lower extremities Patient having difficulty with voiding Has significant urinary retention We will leave catheter in today and voiding trial tomorrow Modest drainage from inferior portion of incision Continue dressing changes until dry

## 2019-06-08 NOTE — TOC Initial Note (Signed)
Transition of Care [facility]) - Initial/Assessment Note    Patient Details  Name: [name] Mathews MRN: [id number] Date of Birth: 1934-01-23  Transition [facility] Care [facility]) CM/SW Contact:    Gelene Mink, [place] Phone Number: 06/08/2019, 12:55 PM  Clinical Narrative:                  CSW met with the patient at bedside. She was eating her lunch. The patient's daughter was present. CSW obtained permission to speak with the daughter present. The patient's daughter is Louretta Shorten, she is also her mother's POA. The patient's daughter does not want to send her mother to SNF placement with COVID. She stated that her mother lives with her. They have converted their basement into a mother-in-law suite. She has a Systems analyst to come in and sit with her mother everyday from 9-4pm. She stated that they were using [facility] before her mother's surgery. [name] would like her mother to come home with home health and continue to use [facility]. They requested a elevated commode chair and a rolliator. She has a bedside commode, 3-in-1, and a cane at home for her mother. She has a PCP and will follow up with as scheduled.   CSW called and spoke with Corene Cornea with [facility]. They will be able to continue to keep working with the patient. She needs PT/OT and a nurse aide.   CSW called and spoke with [place] with [facility]. CSW ordered rolliator and elevated commode chair.   CSW spoke with [facility] and asked her to put in orders for home health PT, OT, and nurse aide.   CSW will continue to follow.   Expected Discharge Plan: [place] Barriers to Discharge: Continued Medical Work up   Patient Goals and CMS Choice Patient states their goals for this hospitalization and ongoing recovery are:: Pt daughter would like her mother to return home with home health services CMS Medicare.gov Compare Post Acute Care list provided to:: Patient Represenative (must comment) Choice offered to / list presented to : Adult  Children, HC POA / Guardian  Expected Discharge Plan and Services Expected Discharge Plan: [place] In-house Referral: Clinical Social Work   Post Acute Care Choice: [place] arrangements for the past 2 months: [place]                 DME Arranged: Walker rolling with seat, Eelevated commode seat DME Agency: [facility] Date DME Agency Contacted: 06/08/19 Time DME Agency Contacted: [id number] Representative spoke with at DME Agency: [place]: PT, OT, Nurse's Aide [place] Agency: [place] ([place]) Date [place]: 06/08/19 Time [place]: 1255 Representative spoke with at [employer]: [place] Arrangements/Services Living arrangements for the past 2 months: [place] with:: Adult Children Patient language and need for interpreter reviewed:: No Do you feel safe going back to the place where you live?: Yes      Need for Family Participation in Patient Care: Yes (Comment) Care giver support system in place?: Yes (comment) Current home services: Home OT, Home PT, DME Criminal Activity/Legal Involvement Pertinent to Current Situation/Hospitalization: No - Comment as needed  Activities of Daily Living      Permission Sought/Granted Permission sought to share information with : Case Manager Permission granted to share information with : Yes, Verbal Permission Granted  Share Information with NAME: [name]  Permission granted to share info w AGENCY: [facility]  Permission granted to share info w Relationship: Daughter     Emotional Assessment Appearance:: Appears stated age Attitude/Demeanor/Rapport: Engaged Affect (typically observed): Calm Orientation: : Oriented to Self, Oriented to Place, Oriented to  Time, Oriented to Situation Alcohol / Substance Use: Not Applicable Psych Involvement: No (comment)  Admission diagnosis:  LUMBAR ADJACENT SEGMENT DISEASE WITH  SPONDYLOLISTHESIS Patient Active Problem List   Diagnosis Date Noted  . Lumbar adjacent segment disease with spondylolisthesis 06/05/2019  . Hypersomnia with long sleep time, idiopathic 01/17/2018  . Snoring 01/17/2018  . Sleep apnea with hypersomnolence 01/17/2018  . Excessive daytime sleepiness 01/17/2018  . Normochromic normocytic anemia 05/22/2017  . Radiculopathy 07/11/2012  . HTN (hypertension) 07/08/2012  . Diabetes mellitus ([place]) 07/08/2012  . Encephalopathy acute 07/08/2012  . Tachycardia 07/08/2012  . UTI (lower urinary tract infection) 07/08/2012   PCP:  Crist Infante, MD Pharmacy:   [place], [place] to [address]istered [place] [address] Phone: [phone] Fax: [phone]  [id number] [place] [place] [address] [place]Phone: [phone]: [phone]    Social Determinants of Health (SDOH) Interventions    Readmission Risk Interventions No flowsheet data found.

## 2019-06-08 NOTE — Progress Notes (Signed)
Physical Therapy Treatment Patient Details Name: [name] Mathews MRN: [id number] DOB: 01/23/1934 Today's Date: 06/08/2019    History of Present Illness Pt is an 85 yo female s/p PLIF L2-3, interbody prosthesis explore fusion B/L L1-2 laminotomy. PMH including but not limited to DM, HTN and PVD.    PT Comments    Pt is up to side of bed with PT assisting mod assist and dense cues for safety.  Pt is not initiating any mobility unless her cues are both tactile and verbal, and is unsafe to walk on the hall without a chair behind her.  Family in attendance and want to take her home, but PT does not recommend this due to pt's dense level of care and her ongoing wound issue from surgery.  Will progress with all care to see how far she gets with gait and balance skills but expectation is that she will continue to need a 2 person assist for gait.     Follow Up Recommendations  Supervision/Assistance - 24 hour;CIR     Equipment Recommendations  None recommended by PT    Recommendations for Other Services Rehab consult     Precautions / Restrictions Precautions Precautions: Fall;Back Precaution Comments: reviewed precautions, pt knew one Required Braces or Orthoses: Spinal Brace Spinal Brace: Lumbar corset;Applied in sitting position Restrictions Weight Bearing Restrictions: No    Mobility  Bed Mobility Overal bed mobility: Needs Assistance Bed Mobility: Supine to Sit Rolling: Mod assist Sidelying to sit: Mod assist       General bed mobility comments: increased time, requires dense tactile cues for all mobility and does not initiate without PT intervening more than vc's  Transfers Overall transfer level: Needs assistance Equipment used: Rolling walker (2 wheeled) Transfers: Sit to/from Stand Sit to Stand: From elevated surface;Mod assist Stand pivot transfers: Mod assist       General transfer comment: STS only as pt sat in chair to allow return due to drainage from  bandage  Ambulation/Gait Ambulation/Gait assistance: Min assist;+2 physical assistance;+2 safety/equipment Gait Distance (Feet): 80 Feet Assistive device: Rolling walker (2 wheeled) Gait Pattern/deviations: Step-through pattern;Shuffle;Wide base of support;Trunk flexed Gait velocity: reduced Gait velocity interpretation: <1.8 ft/sec, indicate of risk for recurrent falls General Gait Details: requires continual cues for drift of walker to L and to pull up posture   Stairs             Wheelchair Mobility    Modified Rankin (Stroke Patients Only)       Balance Overall balance assessment: Needs assistance Sitting-balance support: Feet supported Sitting balance-Leahy Scale: Poor Sitting balance - Comments: needs PT to support sitting posture for nsg to change her bandage                                    Cognition Arousal/Alertness: Lethargic;Awake/alert Behavior During Therapy: Flat affect Overall Cognitive Status: Impaired/Different from baseline Area of Impairment: Following commands;Safety/judgement;Awareness;Problem solving                   Current Attention Level: Selective   Following Commands: Follows one step commands inconsistently;Follows one step commands with increased time Safety/Judgement: Decreased awareness of safety;Decreased awareness of deficits Awareness: Intellectual Problem Solving: Slow processing;Decreased initiation;Requires verbal cues;Requires tactile cues General Comments: Pt is having significant drainage from her spine, serosanguinous with steady drips once in hallway.  Pt unaware of the issue      Exercises General  Exercises - Lower Extremity Long Arc Quad: Strengthening;Both;10 reps Heel Slides: Strengthening;Both;10 reps Hip ABduction/ADduction: Strengthening;Both;10 reps    General Comments General comments (skin integrity, edema, etc.): Pt is up to side of bed with PT and walks for a trip, sat in chair  following her due to drainage, and finally completed her session with LE ex's      Pertinent Vitals/Pain Pain Assessment: Faces Faces Pain Scale: Hurts little more Pain Location: back Pain Descriptors / Indicators: Operative site guarding Pain Intervention(s): Limited activity within patient's tolerance;Monitored during session;Premedicated before session;Repositioned    Home Living                      Prior Function            PT Goals (current goals can now be found in the care plan section) Acute Rehab PT Goals Patient Stated Goal: per daughter to go home PT Goal Formulation: With family Progress towards PT goals: Progressing toward goals    Frequency    Min 5X/week      PT Plan Discharge plan needs to be updated    Co-evaluation              AM-PAC PT "6 Clicks" Mobility   Outcome Measure  Help needed turning from your back to your side while in a flat bed without using bedrails?: A Lot Help needed moving from lying on your back to sitting on the side of a flat bed without using bedrails?: A Lot Help needed moving to and from a bed to a chair (including a wheelchair)?: A Lot Help needed standing up from a chair using your arms (e.g., wheelchair or bedside chair)?: A Lot Help needed to walk in hospital room?: A Lot Help needed climbing 3-5 steps with a railing? : Total 6 Click Score: 11    End of Session Equipment Utilized During Treatment: Gait belt;Back brace Activity Tolerance: Patient limited by lethargy Patient left: in chair;with call bell/phone within reach;with nursing/sitter in room;with family/visitor present;Other (comment)(pillows for upright sitting posture behind back and laterall) Nurse Communication: Mobility status PT Visit Diagnosis: Muscle weakness (generalized) (M62.81);Difficulty in walking, not elsewhere classified (R26.2)     Time: 4098-11911102-1141 PT Time Calculation (min) (ACUTE ONLY): 39 min  Charges:  $Gait Training: 8-22  mins $Therapeutic Exercise: 8-22 mins $Therapeutic Activity: 8-22 mins                     [name] Mathews 06/08/2019, 2:50 PM   [name] Mathews, PT MS Acute Rehab Dept. Number: [facility] [phone] and [facility] [phone]

## 2019-06-08 NOTE — Progress Notes (Signed)
Occupational Therapy Treatment Patient Details Name: Tama GanderLila S Betts MRN: [id number] DOB: 1933/11/09 Today's Date: 06/08/2019    History of present illness Pt is an 85 yo female s/p PLIF L2-3, interbody prosthesis explore fusion B/L L1-2 laminotomy. PMH including but not limited to DM, HTN and PVD.   OT comments  Pt. Seen for skilled OT.  Pt. Requiring max/total A for bed mobility with 2 person assist back to bed.  Unable to maintain supported and un supported sitting eob.  Notable difficulty with managing breakfast tray.  Will need to assess at next sessions amount of physical support available at home in conjunction with plans for continued therapy before home.    Follow Up Recommendations  Home health OT;Supervision/Assistance - 24 hour    Equipment Recommendations  3 in 1 bedside commode;Other (comment)    Recommendations for Other Services      Precautions / Restrictions Precautions Precautions: Back Precaution Comments: reviewed 3/3 back precautions and log roll technique with pt throughout-pt. 0/3 for recall on precautions Required Braces or Orthoses: Spinal Brace Spinal Brace: Lumbar corset;Applied in sitting position       Mobility Bed Mobility Overal bed mobility: Needs Assistance Bed Mobility: Rolling;Sidelying to Sit;Sit to Sidelying Rolling: Max assist;Total assist Sidelying to sit: Max assist     Sit to sidelying: Max assist;+2 for physical assistance General bed mobility comments: increased time and effort, multimodal cueing required, use of bed pads to roll hips into sidelying and for positioning at EOB  Transfers                 General transfer comment: unable to attempt transfer, pt. could not maintain supported and un suuported sitting. rn assisted back to bed    Balance Overall balance assessment: Needs assistance Sitting-balance support: Single extremity supported;Feet supported;Feet unsupported Sitting balance-Leahy Scale: Poor Sitting balance  - Comments: unable to maintain sitting eob with ue support and without, would begin to fall backwards and not make any initiation to correct even with cues and assistance                                   ADL either performed or assessed with clinical judgement   ADL Overall ADL's : Needs assistance/impaired Eating/Feeding: Minimal assistance;Bed level Eating/Feeding Details (indicate cue type and reason): unable to open containers. placed spoon in (L) hand, pt. states she is left handed. she would not initiate scooping a bite of yogurt, left with rn as it was end of session so i did not see if she can self feed without assistance                                         Vision       Perception     Praxis      Cognition Arousal/Alertness: Lethargic Behavior During Therapy: Flat affect Overall Cognitive Status: Impaired/Different from baseline Area of Impairment: Attention;Following commands;Problem solving                       Following Commands: Follows one step commands inconsistently;Follows one step commands with increased time     Problem Solving: Slow processing;Decreased initiation;Difficulty sequencing;Requires verbal cues;Requires tactile cues General Comments: would say "hold on a minute" when a task was being initiated but then not initiate it or  continue with desired task        Exercises     Shoulder Instructions       General Comments      Pertinent Vitals/ Pain       Pain Assessment: Faces Faces Pain Scale: Hurts little more Pain Location: back Pain Descriptors / Indicators: Discomfort Pain Intervention(s): RN gave pain meds during session;Repositioned  Home Living                                          Prior Functioning/Environment              Frequency  Min 2X/week        Progress Toward Goals  OT Goals(current goals can now be found in the care plan section)  Progress  towards OT goals: Progressing toward goals     Plan Discharge plan remains appropriate    Co-evaluation                 AM-PAC OT "6 Clicks" Daily Activity     Outcome Measure   Help from another person eating meals?: None Help from another person taking care of personal grooming?: A Little Help from another person toileting, which includes using toliet, bedpan, or urinal?: A Lot Help from another person bathing (including washing, rinsing, drying)?: A Lot Help from another person to put on and taking off regular upper body clothing?: A Little Help from another person to put on and taking off regular lower body clothing?: A Lot 6 Click Score: 16    End of Session    OT Visit Diagnosis: Unsteadiness on feet (R26.81);Muscle weakness (generalized) (M62.81)   Activity Tolerance Patient limited by lethargy;Patient limited by fatigue;Other (comment)   Patient Left in bed;with nursing/sitter in room   Nurse Communication Mobility status;Other (comment)(rn present and we discussed option to chair but ultimately decided to return to bed)        Time: 6387-5643 OT Time Calculation (min): 18 min  Charges: OT General Charges $OT Visit: 1 Visit OT Treatments $Self Care/Home Management : 8-22 mins  Janice Coffin, COTA/L 06/08/2019, 9:56 AM

## 2019-06-08 NOTE — Progress Notes (Signed)
Honeycomb dressing on back saturated with blood while ambulating in hallway with physical therapy. Dressing changed. Nurse will continue to monitor.

## 2019-06-09 LAB — GLUCOSE, CAPILLARY
Glucose-Capillary: 147 mg/dL — ABNORMAL HIGH (ref 70–99)
Glucose-Capillary: 98 mg/dL (ref 70–99)
Glucose-Capillary: 113 mg/dL — ABNORMAL HIGH (ref 70–99)
Glucose-Capillary: 136 mg/dL — ABNORMAL HIGH (ref 70–99)
Glucose-Capillary: 157 mg/dL — ABNORMAL HIGH (ref 70–99)
Glucose-Capillary: 143 mg/dL — ABNORMAL HIGH (ref 70–99)

## 2019-06-09 NOTE — Progress Notes (Signed)
Physical Therapy Treatment Patient Details Name: [name] Mathews MRN: [id number] DOB: [birth year] Today's Date: 06/09/2019    History of Present Illness Pt is an 85 yo female s/p PLIF L2-3, interbody prosthesis explore fusion B/L L1-2 laminotomy. PMH including but not limited to DM, HTN and PVD.    PT Comments    Patient seen for mobility progression. Pt presents with lethargy and slow processing. [name] present throughout session and reports that anesthesia makes her "groggy for a while". Pt requires mod/max for bed mobility, max A +2 for sit to stand from EOB, and min A +2 for gait training with RW.  Continue to progress as tolerated.    Follow Up Recommendations  Supervision/Assistance - 24 hour;CIR     Equipment Recommendations  None recommended by PT    Recommendations for Other Services       Precautions / Restrictions Precautions Precautions: Fall;Back Precaution Comments: pt recalls 1/3 precautions and all precautions reviewed with pt Required Braces or Orthoses: Spinal Brace Spinal Brace: Lumbar corset;Applied in sitting position Restrictions Weight Bearing Restrictions: No    Mobility  Bed Mobility Overal bed mobility: Needs Assistance Bed Mobility: Rolling;Sidelying to Sit Rolling: Mod assist Sidelying to sit: Max assist       General bed mobility comments: pt requires hand over hand assist to reach for bed rail, assistance to flex bilate LE and then to initiate rolling into side lying; multimodal cues required   Transfers Overall transfer level: Needs assistance Equipment used: Rolling walker (2 wheeled) Transfers: Sit to/from Stand Sit to Stand: Max assist;+2 physical assistance;+2 safety/equipment         General transfer comment: assistance required for positioning and hand placement prior to standing and then +2 for power up into standing; pt with posterior bias initially  Ambulation/Gait Ambulation/Gait assistance: Min assist;+2 physical  assistance;+2 safety/equipment Gait Distance (Feet): 100 Feet Assistive device: Rolling walker (2 wheeled) Gait Pattern/deviations: Step-through pattern;Wide base of support;Trunk flexed;Decreased stride length Gait velocity: reduced   General Gait Details: mulitmodal cues for upright posture and assistance to steady and guide RW; verbal cues for forward gaze and directional cues needed as pt did not attempt to avoid objects in room/hallway   Stairs             Wheelchair Mobility    Modified Rankin (Stroke Patients Only)       Balance Overall balance assessment: Needs assistance Sitting-balance support: Feet supported;Bilateral upper extremity supported Sitting balance-Leahy Scale: Poor   Postural control: Posterior lean Standing balance support: Bilateral upper extremity supported;During functional activity Standing balance-Leahy Scale: Poor                              Cognition Arousal/Alertness: Lethargic(daughter states pt stays "groggy" for days after anesthesia) Behavior During Therapy: Flat affect Overall Cognitive Status: Impaired/Different from baseline Area of Impairment: Following commands;Safety/judgement;Awareness;Problem solving;Attention;Memory                   Current Attention Level: Sustained Memory: Decreased recall of precautions;Decreased short-term memory Following Commands: Follows one step commands inconsistently;Follows one step commands with increased time Safety/Judgement: Decreased awareness of safety;Decreased awareness of deficits Awareness: Intellectual Problem Solving: Slow processing;Decreased initiation;Requires verbal cues;Requires tactile cues;Difficulty sequencing        Exercises      General Comments        Pertinent Vitals/Pain Pain Assessment: Faces Faces Pain Scale: Hurts a little bit Pain Location: back  Pain Descriptors / Indicators: Sore Pain Intervention(s): Limited activity within  patient's tolerance;Monitored during session;Repositioned    Home Living                      Prior Function            PT Goals (current goals can now be found in the care plan section) Progress towards PT goals: Progressing toward goals    Frequency    Min 5X/week      PT Plan Current plan remains appropriate    Co-evaluation              AM-PAC PT "6 Clicks" Mobility   Outcome Measure  Help needed turning from your back to your side while in a flat bed without using bedrails?: A Lot Help needed moving from lying on your back to sitting on the side of a flat bed without using bedrails?: A Lot Help needed moving to and from a bed to a chair (including a wheelchair)?: A Lot Help needed standing up from a chair using your arms (e.g., wheelchair or bedside chair)?: A Lot Help needed to walk in hospital room?: A Lot Help needed climbing 3-5 steps with a railing? : Total 6 Click Score: 11    End of Session Equipment Utilized During Treatment: Gait belt;Back brace Activity Tolerance: Patient limited by lethargy Patient left: in chair;with call bell/phone within reach;with family/visitor present;with chair alarm set Nurse Communication: Mobility status PT Visit Diagnosis: Muscle weakness (generalized) (M62.81);Difficulty in walking, not elsewhere classified (R26.2)     Time: 1040-1110 PT Time Calculation (min) (ACUTE ONLY): 30 min  Charges:  $Gait Training: 8-22 mins $Therapeutic Activity: 8-22 mins                     Erline LevineKellyn Obdulio Mash, PTA Acute Rehabilitation Services Pager: [phone] Office: [phone]     Carolynne EdouardKellyn R Elainah Rhyne 06/09/2019, 11:35 AM

## 2019-06-09 NOTE — Progress Notes (Addendum)
Patient ID: [name] Mathews, female   DOB: June 28, 1934, 85 y.o.   MRN: [id number] Vital signs are stable Patient still with minimal drainage Foley catheter out Daughter present in room, asking questions about how long her confused status may last and also disposition... possibly CIR or home health etc.. deferred to discuss with Dr Arnoldo Morale.

## 2019-06-10 ENCOUNTER — Inpatient Hospital Stay (HOSPITAL_COMMUNITY)
Admission: RE | Admit: 2019-06-10 | Discharge: 2019-06-29 | DRG: 560 | Disposition: A | Discharge status: Home Health | Payer: Medicare Other | Source: Intra-hospital | Attending: Physical Medicine and Rehabilitation | Admitting: Physical Medicine and Rehabilitation

## 2019-06-10 ENCOUNTER — Inpatient Hospital Stay (HOSPITAL_COMMUNITY): Payer: Medicare Other

## 2019-06-10 ENCOUNTER — Encounter (HOSPITAL_COMMUNITY): Payer: Self-pay | Admitting: *Deleted

## 2019-06-10 ENCOUNTER — Other Ambulatory Visit: Payer: Self-pay

## 2019-06-10 DIAGNOSIS — N182 Chronic kidney disease, stage 2 (mild): Secondary | ICD-10-CM | POA: Diagnosis present

## 2019-06-10 DIAGNOSIS — I6789 Other cerebrovascular disease: Secondary | ICD-10-CM | POA: Diagnosis present

## 2019-06-10 DIAGNOSIS — Z7984 Long term (current) use of oral hypoglycemic drugs: Secondary | ICD-10-CM | POA: Diagnosis not present

## 2019-06-10 DIAGNOSIS — R0989 Other specified symptoms and signs involving the circulatory and respiratory systems: Secondary | ICD-10-CM | POA: Diagnosis not present

## 2019-06-10 DIAGNOSIS — K5903 Drug induced constipation: Secondary | ICD-10-CM

## 2019-06-10 DIAGNOSIS — D62 Acute posthemorrhagic anemia: Secondary | ICD-10-CM | POA: Diagnosis present

## 2019-06-10 DIAGNOSIS — E669 Obesity, unspecified: Secondary | ICD-10-CM | POA: Diagnosis present

## 2019-06-10 DIAGNOSIS — Z981 Arthrodesis status: Secondary | ICD-10-CM | POA: Diagnosis not present

## 2019-06-10 DIAGNOSIS — Z7982 Long term (current) use of aspirin: Secondary | ICD-10-CM

## 2019-06-10 DIAGNOSIS — I10 Essential (primary) hypertension: Secondary | ICD-10-CM

## 2019-06-10 DIAGNOSIS — E785 Hyperlipidemia, unspecified: Secondary | ICD-10-CM | POA: Diagnosis present

## 2019-06-10 DIAGNOSIS — Z4789 Encounter for other orthopedic aftercare: Principal | ICD-10-CM

## 2019-06-10 DIAGNOSIS — R410 Disorientation, unspecified: Secondary | ICD-10-CM | POA: Diagnosis not present

## 2019-06-10 DIAGNOSIS — M5416 Radiculopathy, lumbar region: Secondary | ICD-10-CM | POA: Diagnosis present

## 2019-06-10 DIAGNOSIS — K5901 Slow transit constipation: Secondary | ICD-10-CM

## 2019-06-10 DIAGNOSIS — E1122 Type 2 diabetes mellitus with diabetic chronic kidney disease: Secondary | ICD-10-CM | POA: Diagnosis present

## 2019-06-10 DIAGNOSIS — I129 Hypertensive chronic kidney disease with stage 1 through stage 4 chronic kidney disease, or unspecified chronic kidney disease: Secondary | ICD-10-CM | POA: Diagnosis present

## 2019-06-10 DIAGNOSIS — I679 Cerebrovascular disease, unspecified: Secondary | ICD-10-CM | POA: Diagnosis not present

## 2019-06-10 DIAGNOSIS — R7309 Other abnormal glucose: Secondary | ICD-10-CM | POA: Diagnosis not present

## 2019-06-10 DIAGNOSIS — Z6830 Body mass index (BMI) 30.0-30.9, adult: Secondary | ICD-10-CM | POA: Diagnosis not present

## 2019-06-10 DIAGNOSIS — E1169 Type 2 diabetes mellitus with other specified complication: Secondary | ICD-10-CM | POA: Diagnosis not present

## 2019-06-10 DIAGNOSIS — D649 Anemia, unspecified: Secondary | ICD-10-CM | POA: Diagnosis not present

## 2019-06-10 DIAGNOSIS — M7989 Other specified soft tissue disorders: Secondary | ICD-10-CM

## 2019-06-10 DIAGNOSIS — K5900 Constipation, unspecified: Secondary | ICD-10-CM | POA: Diagnosis present

## 2019-06-10 DIAGNOSIS — R4189 Other symptoms and signs involving cognitive functions and awareness: Secondary | ICD-10-CM | POA: Diagnosis present

## 2019-06-10 LAB — GLUCOSE, CAPILLARY
Glucose-Capillary: 104 mg/dL — ABNORMAL HIGH (ref 70–99)
Glucose-Capillary: 104 mg/dL — ABNORMAL HIGH (ref 70–99)
Glucose-Capillary: 123 mg/dL — ABNORMAL HIGH (ref 70–99)
Glucose-Capillary: 157 mg/dL — ABNORMAL HIGH (ref 70–99)
Glucose-Capillary: 175 mg/dL — ABNORMAL HIGH (ref 70–99)
Glucose-Capillary: 176 mg/dL — ABNORMAL HIGH (ref 70–99)

## 2019-06-10 MED ORDER — SENNOSIDES-DOCUSATE SODIUM 8.6-50 MG PO TABS
1.0000 | ORAL_TABLET | Freq: Two times a day (BID) | ORAL | Status: DC
  Administered 2019-06-10 – 2019-06-14 (×9): 1 via ORAL
  Filled 2019-06-10 (×11): qty 1

## 2019-06-10 MED ORDER — SORBITOL 70 % SOLN
30.0000 mL | Freq: Every day | Status: DC | PRN
  Administered 2019-06-10 – 2019-06-11 (×2): 30 mL via ORAL
  Filled 2019-06-10 (×2): qty 30

## 2019-06-10 MED ORDER — ACETAMINOPHEN 650 MG RE SUPP: 650.0000 mg | RECTAL | Status: DC | PRN

## 2019-06-10 MED ORDER — HYDROCHLOROTHIAZIDE 12.5 MG PO CAPS
12.5000 mg | ORAL_CAPSULE | Freq: Every day | ORAL | Status: DC
  Administered 2019-06-11 – 2019-06-25 (×15): 12.5 mg via ORAL
  Filled 2019-06-10 (×15): qty 1

## 2019-06-10 MED ORDER — AMLODIPINE BESYLATE 10 MG PO TABS
10.0000 mg | ORAL_TABLET | Freq: Every day | ORAL | Status: DC
  Administered 2019-06-11 – 2019-06-25 (×15): 10 mg via ORAL
  Filled 2019-06-10 (×16): qty 1

## 2019-06-10 MED ORDER — IRBESARTAN 300 MG PO TABS
150.0000 mg | ORAL_TABLET | Freq: Every day | ORAL | Status: DC
  Administered 2019-06-11 – 2019-06-25 (×15): 150 mg via ORAL
  Filled 2019-06-10 (×15): qty 1

## 2019-06-10 MED ORDER — ONDANSETRON HCL 4 MG PO TABS: 4.0000 mg | ORAL_TABLET | Freq: Four times a day (QID) | ORAL | Status: DC | PRN

## 2019-06-10 MED ORDER — OXYCODONE HCL 5 MG PO TABS
5.0000 mg | ORAL_TABLET | ORAL | Status: DC | PRN
  Administered 2019-06-15: 5 mg via ORAL
  Filled 2019-06-10: qty 1

## 2019-06-10 MED ORDER — METFORMIN HCL 500 MG PO TABS
500.0000 mg | ORAL_TABLET | Freq: Two times a day (BID) | ORAL | Status: DC
  Administered 2019-06-10 – 2019-06-16 (×13): 500 mg via ORAL
  Filled 2019-06-10 (×13): qty 1

## 2019-06-10 MED ORDER — LINACLOTIDE 145 MCG PO CAPS
145.0000 ug | ORAL_CAPSULE | ORAL | Status: DC
  Administered 2019-06-11 – 2019-06-13 (×2): 145 ug via ORAL
  Filled 2019-06-10 (×2): qty 1

## 2019-06-10 MED ORDER — FLUTICASONE PROPIONATE 50 MCG/ACT NA SUSP
1.0000 | Freq: Every day | NASAL | Status: DC | PRN
  Filled 2019-06-10: qty 16

## 2019-06-10 MED ORDER — PIOGLITAZONE HCL 15 MG PO TABS
30.0000 mg | ORAL_TABLET | Freq: Every day | ORAL | Status: DC
  Administered 2019-06-11 – 2019-06-29 (×19): 30 mg via ORAL
  Filled 2019-06-10 (×19): qty 2

## 2019-06-10 MED ORDER — BISACODYL 10 MG RE SUPP: 10.0000 mg | Freq: Every day | RECTAL | Status: DC | PRN

## 2019-06-10 MED ORDER — CYCLOBENZAPRINE HCL 10 MG PO TABS: 10.0000 mg | ORAL_TABLET | Freq: Three times a day (TID) | ORAL | Status: DC | PRN

## 2019-06-10 MED ORDER — ONDANSETRON HCL 4 MG/2ML IJ SOLN: 4.0000 mg | Freq: Four times a day (QID) | INTRAMUSCULAR | Status: DC | PRN

## 2019-06-10 MED ORDER — ADULT MULTIVITAMIN W/MINERALS CH
1.0000 | ORAL_TABLET | Freq: Every day | ORAL | Status: DC
  Administered 2019-06-11 – 2019-06-29 (×19): 1 via ORAL
  Filled 2019-06-10 (×19): qty 1

## 2019-06-10 MED ORDER — INSULIN ASPART 100 UNIT/ML ~~LOC~~ SOLN
0.0000 [IU] | SUBCUTANEOUS | Status: DC
  Administered 2019-06-10: 22:00:00 4 [IU] via SUBCUTANEOUS
  Administered 2019-06-10 – 2019-06-11 (×3): 3 [IU] via SUBCUTANEOUS

## 2019-06-10 MED ORDER — FERROUS SULFATE 325 (65 FE) MG PO TABS
325.0000 mg | ORAL_TABLET | Freq: Every day | ORAL | Status: DC
  Administered 2019-06-11 – 2019-06-29 (×19): 325 mg via ORAL
  Filled 2019-06-10 (×19): qty 1

## 2019-06-10 MED ORDER — CEPHALEXIN 250 MG PO CAPS
500.0000 mg | ORAL_CAPSULE | Freq: Two times a day (BID) | ORAL | Status: AC
  Administered 2019-06-10 – 2019-06-13 (×7): 500 mg via ORAL
  Filled 2019-06-10 (×7): qty 2

## 2019-06-10 MED ORDER — GALANTAMINE HYDROBROMIDE ER 8 MG PO CP24
16.0000 mg | ORAL_CAPSULE | Freq: Every evening | ORAL | Status: DC
  Administered 2019-06-10 – 2019-06-28 (×19): 16 mg via ORAL
  Filled 2019-06-10 (×19): qty 2

## 2019-06-10 MED ORDER — ACETAMINOPHEN 325 MG PO TABS
650.0000 mg | ORAL_TABLET | ORAL | Status: DC | PRN
  Administered 2019-06-11 – 2019-06-19 (×2): 650 mg via ORAL
  Filled 2019-06-10 (×2): qty 2

## 2019-06-10 MED ORDER — ROSUVASTATIN CALCIUM 5 MG PO TABS
5.0000 mg | ORAL_TABLET | Freq: Every day | ORAL | Status: DC
  Administered 2019-06-11 – 2019-06-29 (×19): 5 mg via ORAL
  Filled 2019-06-10 (×19): qty 1

## 2019-06-10 NOTE — Progress Notes (Signed)
LE venous duplex       has been completed. Preliminary results can be found under CV proc through chart review. Armel Rabbani, BS, RDMS, RVT   

## 2019-06-10 NOTE — Progress Notes (Signed)
Inpatient Rehabilitation Admissions Coordinator  Inpatient rehab consult received. I met with patent an her daughter, [name] at bedside. We discussed goals and expectations of an inpt rehab admit. They prefer inpt rehab . I contacted Dr. Arnoldo Morale and we will arrange admit to CIR today.  Danne Baxter, RN, MSN Rehab Admissions Coordinator [phone] 06/10/2019 12:48 PM

## 2019-06-10 NOTE — Progress Notes (Signed)
Physical Therapy Treatment Patient Details Name: [name] Mathews MRN: [id number] DOB: 12-28-1933 Today's Date: 06/10/2019    History of Present Illness Pt is an 85 yo female s/p PLIF L2-3, interbody prosthesis explore fusion B/L L1-2 laminotomy. PMH including but not limited to DM, HTN and PVD.    PT Comments    Pt progressing slowly towards physical therapy goals. Was able to perform transfers and ambulation with +2 assist for balance support and safety with RW. Pt with minimal verbalizations and very slow moving this session. Max assist required to don brace, even with hand over hand assist to initiate. Will continue to follow and progress as able per POC. If CIR does not approve admission, recommend SNF level rehab due to heavy +2 required for basic transfers at this time.    Follow Up Recommendations  Supervision/Assistance - 24 hour;CIR     Equipment Recommendations  None recommended by PT    Recommendations for Other Services Rehab consult     Precautions / Restrictions Precautions Precautions: Fall;Back Precaution Booklet Issued: Yes (comment) Precaution Comments: educated in back precautions with bed mobility and donning back brace Required Braces or Orthoses: Spinal Brace Spinal Brace: Lumbar corset;Applied in sitting position Restrictions Weight Bearing Restrictions: No    Mobility  Bed Mobility Overal bed mobility: Needs Assistance Bed Mobility: Rolling;Sidelying to Sit Rolling: +2 for physical assistance;Max assist Sidelying to sit: +2 for safety/equipment;Mod assist       General bed mobility comments: step by step cues for log roll technique, assist for all aspects  Transfers Overall transfer level: Needs assistance Equipment used: Rolling walker (2 wheeled) Transfers: Sit to/from [facility] Sit to Stand: +2 physical assistance;Mod assist;Max assist Stand pivot transfers: +2 physical assistance;Max assist       General transfer  comment: cues for hand placement and to bring feet back, assist with gait and under hips to stand, +2 max from bed upon second attempt, +2 mod from [facility]  Ambulation/Gait Ambulation/Gait assistance: Min assist;+2 physical assistance;+2 safety/equipment Gait Distance (Feet): 25 Feet Assistive device: Rolling walker (2 wheeled) Gait Pattern/deviations: Step-through pattern;Wide base of support;Trunk flexed;Decreased stride length Gait velocity: decreased Gait velocity interpretation: <1.31 ft/sec, indicative of household ambulator General Gait Details: mulitmodal cues for upright posture and assistance to steady and guide RW; verbal cues for forward gaze.   Stairs             Wheelchair Mobility    Modified Rankin (Stroke Patients Only)       Balance Overall balance assessment: Needs assistance Sitting-balance support: Feet supported;Bilateral upper extremity supported Sitting balance-Leahy Scale: Poor Sitting balance - Comments: posterior lean Postural control: Posterior lean Standing balance support: Bilateral upper extremity supported;During functional activity Standing balance-Leahy Scale: Poor Standing balance comment: flexed posture, requires B UE support on walker and therapist assist                            Cognition Arousal/Alertness: Lethargic(aroused, but keeping eyes closed) Behavior During Therapy: Flat affect Overall Cognitive Status: Impaired/Different from baseline Area of Impairment: Orientation;Attention;Memory;Following commands;Safety/judgement;Awareness;Problem solving                 Orientation Level: Disoriented to;Time;Place Current Attention Level: Focused Memory: Decreased recall of precautions;Decreased short-term memory Following Commands: Follows one step commands inconsistently;Follows one step commands with increased time Safety/Judgement: Decreased awareness of safety;Decreased awareness of deficits Awareness:  Intellectual Problem Solving: Slow processing;Decreased initiation;Requires verbal cues;Requires tactile cues;Difficulty sequencing  General Comments: pt responding verbally and moving very slowly      Exercises      General Comments        Pertinent Vitals/Pain Pain Assessment: Faces Faces Pain Scale: No hurt Pain Intervention(s): Monitored during session    Home Living                      Prior Function            PT Goals (current goals can now be found in the care plan section) Acute Rehab PT Goals Patient Stated Goal: per daughter to go home PT Goal Formulation: With family Time For Goal Achievement: 06/20/19 Potential to Achieve Goals: Fair Progress towards PT goals: Progressing toward goals    Frequency    Min 5X/week      PT Plan Current plan remains appropriate    Co-evaluation PT/OT/SLP Co-Evaluation/Treatment: Yes Reason for Co-Treatment: Complexity of the patient's impairments (multi-system involvement);For patient/therapist safety;Necessary to address cognition/behavior during functional activity;To address functional/ADL transfers PT goals addressed during session: Mobility/safety with mobility;Balance;Proper use of DME OT goals addressed during session: ADL's and self-care;Proper use of Adaptive equipment and DME      AM-PAC PT "6 Clicks" Mobility   Outcome Measure  Help needed turning from your back to your side while in a flat bed without using bedrails?: A Lot Help needed moving from lying on your back to sitting on the side of a flat bed without using bedrails?: A Lot Help needed moving to and from a bed to a chair (including a wheelchair)?: A Lot Help needed standing up from a chair using your arms (e.g., wheelchair or bedside chair)?: A Lot Help needed to walk in hospital room?: A Lot Help needed climbing 3-5 steps with a railing? : Total 6 Click Score: 11    End of Session Equipment Utilized During Treatment: Gait belt;Back  brace Activity Tolerance: Patient limited by lethargy Patient left: in chair;with call bell/phone within reach;with family/visitor present;with chair alarm set Nurse Communication: Mobility status PT Visit Diagnosis: Muscle weakness (generalized) (M62.81);Difficulty in walking, not elsewhere classified (R26.2)     Time: [id number] PT Time Calculation (min) (ACUTE ONLY): 34 min  Charges:  $Gait Training: 8-22 mins                     Conni SlipperLaura Geno Sydnor, PT, DPT Acute Rehabilitation Services Pager: [phone] Office: [phone]    Marylynn PearsonLaura D Princessa Lesmeister 06/10/2019, 9:27 AM

## 2019-06-10 NOTE — Progress Notes (Addendum)
Subjective: The patient is alert and pleasant.  She is in no apparent distress.  Her radicular pain is gone.  Objective: Vital signs in last 24 hours: Temp:  [98.7 F (37.1 C)-100.6 F (38.1 C)] 99.8 F (37.7 C) (08/10 0808) Pulse Rate:  [78-89] 80 (08/10 0808) Resp:  [16-20] 16 (08/10 0808) BP: (135-172)/(57-73) 135/61 (08/10 0808) SpO2:  [95 %-98 %] 95 % (08/10 0808) Estimated body mass index is 31 kg/m as calculated from the following:   Height as of this encounter: 5' 9.5" (1.765 m).   Weight as of this encounter: 96.6 kg.   Intake/Output from previous day: 08/09 0701 - 08/10 0700 In: -  Out: 550 [Urine:550] Intake/Output this shift: Total I/O In: 120 [P.O.:120] Out: -   Physical exam the patient is alert and oriented.  Her strength is grossly normal in her lower extremities.  Lab Results: No results for input(s): WBC, HGB, HCT, PLT in the last 72 hours. BMET Recent Labs    06/08/19 0301  NA 134*  K 3.8  CL 99  CO2 23  GLUCOSE 108*  BUN 26*  CREATININE 1.36*  CALCIUM 8.7*    Studies/Results: No results found.  Assessment/Plan: Postop day #5: The patient is making slow progress.  She would likely benefit from inpatient rehab.  I spoke with the patient's daughter, [name].  Mild confusion: This seems to be improving.  She had this with her last hospitalization.  This should resolve with time.  Elevated creatinine: She is at her baseline.  Urinary retention: This has resolved  Hyponatremia: Mild  Anemia: She is on iron.  Low-grade fever: We will continue observation.  LOS: 5 days     Ophelia Charter 06/10/2019, 10:56 AM

## 2019-06-10 NOTE — Progress Notes (Signed)
CM spoke to PT and they do not feel patient would be safe with rollator. They are recommending CIR. CIR to evaluate. TOC following.

## 2019-06-10 NOTE — H&P (Signed)
Physical Medicine and Rehabilitation Admission H&P     Chief complaint back pain: HPI: [name] Mathews is an 85 year old right-handed female history of diabetes mellitus, memory deficits, CKD stage II with creatinine baseline 1.25-1.57, hyperlipidemia hypertension, normocytic anemia as well as chronic back pain as well as history of L3-4 and 4-5 decompression and fusion years ago.  Per chart review independent with assistive device prior to admission.  She lives with his daughter.  Two-level level home with ramped entrance with bed and bath on main level.  Patient had been receiving home health therapies for chronic back pain.  Presented 06/05/2019 with recurrent back pain radiating to the lower extremities.  Lumbar MRI and imaging demonstrated stenosis spondylolisthesis with radiculopathy at L2-3.  Underwent bilateral L2-3 and L1-2 laminotomy foraminotomies to decompress the bilateral L2 and L3 nerve roots as well as posterior lumbar interbody fusion 06/05/2019 per Dr. Arnoldo Morale.  Back brace out of bed applied in sitting position.  Hospital course pain management.  Initial bouts of confusion monitoring use of narcotics.  Patient did develop some serosanguineous drainage from wound site placed on Keflex 06/07/2019.  Acute blood loss anemia 8.4 and monitored.  Therapy evaluations completed and patient was admitted for a comprehensive rehab program.   Review of Systems  Constitutional: Negative for chills and fever.  HENT: Negative for hearing loss.   Eyes: Negative for blurred vision and double vision.  Respiratory: Negative for cough and shortness of breath.   Cardiovascular: Positive for leg swelling. Negative for chest pain and palpitations.  Gastrointestinal: Positive for constipation. Negative for heartburn, nausea and vomiting.  Genitourinary: Negative for dysuria, flank pain and hematuria.  Musculoskeletal: Positive for back pain and myalgias.  Skin: Negative for rash.  Neurological: Negative  for seizures.  All other systems reviewed and are negative.       Past Medical History:  Diagnosis Date  . Anemia      hx of  . Arthritis    . Breast lump      hx of  . Cataract      left eye  . Chronic back pain    . Diabetes mellitus    . Hyperlipidemia    . Hypertension      sees Dr. Jarrett Ables, [facility] internal medicine  . Normochromic normocytic anemia 05/22/2017    Hb 10 MCV 85.6 No monoclonal spike on SPEP  Creatinine 1.3-1.6  . Peripheral vascular disease [facility])          Past Surgical History:  Procedure Laterality Date  . APPENDECTOMY      . BACK SURGERY      . BREAST CYST EXCISION Left    . BREAST SURGERY        cyst removed left side  . BUNIONECTOMY      . CARDIOVASCULAR STRESS TEST      . COLONOSCOPY      . DILATION AND CURETTAGE OF UTERUS      . TONSILLECTOMY       History reviewed. No pertinent family history. Social History:  reports that she has never smoked. She has never used smokeless tobacco. She reports that she does not drink alcohol or use drugs. Allergies:       Allergies  Allergen Reactions  . Januvia [Sitagliptin] Other (See Comments)      Numbness in mouth  . Simvastatin Other (See Comments)      tremors         Medications Prior to  Admission  Medication Sig Dispense Refill  . acetaminophen (TYLENOL) 650 MG CR tablet Take 1,300 mg by mouth every 8 (eight) hours as needed for pain.      Marland Kitchen. amLODipine (NORVASC) 10 MG tablet Take 10 mg by mouth daily.      Marland Kitchen. aspirin 81 MG tablet Take 81 mg by mouth daily.      . Cholecalciferol (VITAMIN D3) 5000 units TABS Take 1 tablet by mouth daily.      Marland Kitchen. docusate sodium (COLACE) 250 MG capsule Take 250 mg by mouth daily.      . Ferrous Sulfate (SLOW RELEASE IRON PO) Take 1 tablet by mouth daily.      . fluticasone (FLONASE) 50 MCG/ACT nasal spray Place 1 spray into both nostrils daily as needed for allergies or rhinitis.      Marland Kitchen. galantamine (RAZADYNE ER) 16 MG 24 hr capsule Take 16 mg by mouth  every evening.      . Lactobacillus-Inulin (PROBIOTIC DIGESTIVE SUPPORT PO) Take 2 each by mouth every morning.      . linaclotide (LINZESS) 145 MCG CAPS capsule Take 145 mcg by mouth every other day.      . metFORMIN (GLUCOPHAGE) 500 MG tablet Take 500 mg by mouth 2 (two) times daily with a meal.      . Multiple Vitamins-Minerals (MULTIVITAMIN PO) Take 2 each by mouth daily.       Marland Kitchen. OVER THE COUNTER MEDICATION Take 30 mLs by mouth daily. Black Seed Oil      . pioglitazone (ACTOS) 30 MG tablet Take 30 mg by mouth daily.      . rosuvastatin (CRESTOR) 5 MG tablet Take 5 mg by mouth daily.       Marland Kitchen. telmisartan-hydrochlorothiazide (MICARDIS HCT) 40-12.5 MG tablet Take 1 tablet by mouth daily.      . vitamin B-12 (CYANOCOBALAMIN) 1000 MCG tablet Take 1,000 mcg by mouth daily. OTC      . vitamin C (ASCORBIC ACID) 500 MG tablet Take 500 mg by mouth daily.         Drug Regimen Review Drug regimen was reviewed and remains appropriate with no significant issues identified   Home: Home Living Family/patient expects to be discharged to:: Private residence Living Arrangements: Children(daughter) Available Help at Discharge: Available PRN/intermittently, Family Type of Home: House Home Access: Ramped entrance Home Layout: Able to live on main level with bedroom/bathroom, Multi-level Alternate Level Stairs-Number of Steps: full flight Bathroom Shower/Tub: Health visitorWalk-in shower Bathroom Toilet: Handicapped height Home Equipment: Information systems managerhower seat, Bedside commode, Environmental consultantWalker - 4 wheels, Wheelchair - manual Additional Comments: pt reports she uses a cane occasionally for mobility   Functional History: Prior Function Level of Independence: Independent with assistive device(s) Comments: Pt reports independence with ADL and assist for IADLs.   Functional Status:  Mobility: Bed Mobility Overal bed mobility: Needs Assistance Bed Mobility: Rolling, Sidelying to Sit Rolling: +2 for physical assistance, Max assist  Sidelying to sit: +2 for safety/equipment, Mod assist Sit to sidelying: Max assist, +2 for physical assistance General bed mobility comments: step by step cues for log roll technique, assist for all aspects Transfers Overall transfer level: Needs assistance Equipment used: Rolling walker (2 wheeled) Transfers: Sit to/from Stand, Stand Pivot Transfers Sit to Stand: +2 physical assistance, Mod assist, Max assist Stand pivot transfers: +2 physical assistance, Max assist General transfer comment: cues for hand placement and to bring feet back, assist with gait and under hips to stand, +2 max from bed upon second attempt, +  2 mod [facility] Ambulation/Gait Ambulation/Gait assistance: Min assist, +2 physical assistance, +2 safety/equipment Gait Distance (Feet): 25 Feet Assistive device: Rolling walker (2 wheeled) Gait Pattern/deviations: Step-through pattern, Wide base of support, Trunk flexed, Decreased stride length General Gait Details: mulitmodal cues for upright posture and assistance to steady and guide RW; verbal cues for forward gaze. Gait velocity: decreased Gait velocity interpretation: <1.31 ft/sec, indicative of household ambulator     ADL: ADL Overall ADL's : Needs assistance/impaired Eating/Feeding: Maximal assistance, Sitting Eating/Feeding Details (indicate cue type and reason): to drink from straw Grooming: Maximal assistance, Sitting, Wash/dry face Grooming Details (indicate cue type and reason): brought washcloth to face only Upper Body Bathing: Minimal assistance, Sitting Lower Body Bathing: Moderate assistance, Sitting/lateral leans, Sit to/from stand, Cueing for safety, Cueing for sequencing Upper Body Dressing : Maximal assistance, Sitting Upper Body Dressing Details (indicate cue type and reason): max to total for back brace and front opening gown Lower Body Dressing: Moderate assistance, Sitting/lateral leans, Sit to/from stand, Cueing for safety, Cueing for sequencing  Toilet Transfer: +2 for physical assistance, Maximal assistance, Stand-pivot, BSC Toileting- Clothing Manipulation and Hygiene: Moderate assistance, Sitting/lateral lean, Sit to/from stand, Cueing for safety, Cueing for sequencing Functional mobility during ADLs: Minimal assistance, Moderate assistance, +2 for physical assistance, Rolling walker General ADL Comments: minA for UB ADL due to soreness s/p sx and modA for LB ADL.   Cognition: Cognition Overall Cognitive Status: Impaired/Different from baseline Orientation Level: Oriented to person, Oriented to place Cognition Arousal/Alertness: Lethargic(aroused, but keeping eyes closed) Behavior During Therapy: Flat affect Overall Cognitive Status: Impaired/Different from baseline Area of Impairment: Orientation, Attention, Memory, Following commands, Safety/judgement, Awareness, Problem solving Orientation Level: Disoriented to, Time, Place Current Attention Level: Focused Memory: Decreased recall of precautions, Decreased short-term memory Following Commands: Follows one step commands inconsistently, Follows one step commands with increased time Safety/Judgement: Decreased awareness of safety, Decreased awareness of deficits Awareness: Intellectual Problem Solving: Slow processing, Decreased initiation, Requires verbal cues, Requires tactile cues, Difficulty sequencing General Comments: pt responding verbally and moving very slowly   Physical Exam: Blood pressure 135/61, pulse 80, temperature 99.8 F (37.7 C), temperature source Oral, resp. rate 16, height 5' 9.5" (1.765 m), weight 96.6 kg, SpO2 95 %. Physical Exam  Constitutional: She appears well-developed and well-nourished.  obese  HENT:  Head: Normocephalic and atraumatic.  Eyes: Pupils are equal, round, and reactive to light. Conjunctivae are normal.  Neck: Normal range of motion. No thyromegaly present.  Cardiovascular: Normal rate. Exam reveals no friction rub.  No murmur  heard. Respiratory: Effort normal. No respiratory distress. She has no wheezes.  GI: Soft. She exhibits distension. There is no abdominal tenderness.  Bowel sounds decreased  Musculoskeletal:     Comments: LB TTP, wearing LSO  Neurological: She is alert.  Patient is alert sitting up in chair with daughter at bedside. Oriented to person, place. Month with cues.   Mood is a bit flat but appropriate.  Follows commands.  Makes good eye contact with examiner.    She was a provider name and age but limited overall with recall of hospital course. UE 4/5 although grip on left is 3+ to 4-. LE: 3+ HF, 4- KE and 4/5 ADF/PF.   Skin:  Back incision with s/s drainage.   Psychiatric:  Flat but overall pleasant      Lab Results Last 48 Hours       Results for orders placed or performed during the hospital encounter of 06/05/19 (from the past  48 hour(s))  Glucose, capillary     Status: Abnormal    Collection Time: 06/08/19  4:11 PM  Result Value Ref Range    Glucose-Capillary 171 (H) 70 - 99 mg/dL  Glucose, capillary     Status: Abnormal    Collection Time: 06/08/19  8:17 PM  Result Value Ref Range    Glucose-Capillary 130 (H) 70 - 99 mg/dL  Glucose, capillary     Status: Abnormal    Collection Time: 06/09/19  1:56 AM  Result Value Ref Range    Glucose-Capillary 147 (H) 70 - 99 mg/dL  Glucose, capillary     Status: None    Collection Time: 06/09/19  4:25 AM  Result Value Ref Range    Glucose-Capillary 98 70 - 99 mg/dL  Glucose, capillary     Status: Abnormal    Collection Time: 06/09/19  9:12 AM  Result Value Ref Range    Glucose-Capillary 113 (H) 70 - 99 mg/dL    Comment 1 Notify RN      Comment 2 Document in Chart    Glucose, capillary     Status: Abnormal    Collection Time: 06/09/19 12:36 PM  Result Value Ref Range    Glucose-Capillary 136 (H) 70 - 99 mg/dL    Comment 1 Notify RN      Comment 2 Document in Chart    Glucose, capillary     Status: Abnormal    Collection Time: 06/09/19   4:09 PM  Result Value Ref Range    Glucose-Capillary 157 (H) 70 - 99 mg/dL    Comment 1 Notify RN      Comment 2 Document in Chart    Glucose, capillary     Status: Abnormal    Collection Time: 06/09/19  8:17 PM  Result Value Ref Range    Glucose-Capillary 143 (H) 70 - 99 mg/dL  Glucose, capillary     Status: Abnormal    Collection Time: 06/10/19 12:41 AM  Result Value Ref Range    Glucose-Capillary 104 (H) 70 - 99 mg/dL  Glucose, capillary     Status: Abnormal    Collection Time: 06/10/19  4:40 AM  Result Value Ref Range    Glucose-Capillary 123 (H) 70 - 99 mg/dL  Glucose, capillary     Status: Abnormal    Collection Time: 06/10/19  7:46 AM  Result Value Ref Range    Glucose-Capillary 104 (H) 70 - 99 mg/dL    Comment 1 Notify RN      Comment 2 Document in Chart    Glucose, capillary     Status: Abnormal    Collection Time: 06/10/19 11:34 AM  Result Value Ref Range    Glucose-Capillary 176 (H) 70 - 99 mg/dL    Comment 1 Notify RN      Comment 2 Document in Chart       Imaging Results (Last 48 hours)  No results found.           Medical Problem List and Plan: 1.  Decreased functional mobility secondary to L2-3 spondylolisthesis, L1-2 and 2-3 spinal stenosis compressing L2 and III nerve roots with lumbar radiculopathy.  Status post L2-3 and L1-2 laminotomy foraminotomies posterior lumbar interbody fusion 06/05/2019.               -admit to inpatient rehab             -LSO when out of bed.  2.  Antithrombotics: -DVT/anticoagulation: SCDs.  Check vascular study             -  antiplatelet therapy: N/A 3. Pain Management: Flexeril 10 mg 3 times daily as needed, oxycodone 5 mg every 3 hours as needed.  Monitor mental status as pt demonstrating delayed processing some disorientation today 4. Mood: Rizadyne 16 mg daily             -antipsychotic agents: N/A 5. Neuropsych: This patient is capable of making decisions on his own behalf. 6. Skin/Wound Care: Routine skin checks 7.  Fluids/Electrolytes/Nutrition: Routine in and outs with follow-up chemistries 8.  ID.  Keflex 500 mg twice daily x7 days for serosanguineous back drainage. 9.  Acute on chronic anemia.  Follow-up CBC.  Continue iron supplement 10.  Diabetes mellitus.  Hemoglobin A1c 6.5.  Glucophage 500 mg twice daily, Actos 30 mg daily.  11.  Hypertension.  Avapro 150 mg daily, HCTZ 12.5 mg daily, Norvasc 10 mg daily.  Monitor with increased mobility 12.  CKD stage II.  Creatinine baseline 1.25-1.57.  Follow-up chemistries 13.  Hyperlipidemia.  Crestor 14.  Constipation.  Continue Linzess             -sorbitol +/- enema today if no BM by dinner time.   Post Admission Physician Evaluation: 1. Functional deficits secondary  to lumbar spondlylolisthesis, stenosis s/p decompression and fusion. 2. Patient is admitted to receive collaborative, interdisciplinary care between the physiatrist, rehab nursing staff, and therapy team. 3. Patient's level of medical complexity and substantial therapy needs in context of that medical necessity cannot be provided at a lesser intensity of care such as a SNF. 4. Patient has experienced substantial functional loss from his/her baseline which was documented above under the "Functional History" and "Functional Status" headings.  Judging by the patient's diagnosis, physical exam, and functional history, the patient has potential for functional progress which will result in measurable gains while on inpatient rehab.  These gains will be of substantial and practical use upon discharge  in facilitating mobility and self-care at the household level. 5. Physiatrist will provide 24 hour management of medical needs as well as oversight of the therapy plan/treatment and provide guidance as appropriate regarding the interaction of the two. 6. The Preadmission Screening has been reviewed and patient status is unchanged unless otherwise stated above. 7. 24 hour rehab nursing will assist with  bladder management, bowel management, safety, skin/wound care, disease management, medication administration, pain management and patient education  and help integrate therapy concepts, techniques,education, etc. 8. PT will assess and treat for/with: Lower extremity strength, range of motion, stamina, balance, functional mobility, safety, adaptive techniques and equipment, pain mgt.   Goals are: supervision. 9. OT will assess and treat for/with: ADL's, functional mobility, safety, upper extremity strength, adaptive techniques and equipment, pain mgt, back precautions, brace don/doffing.   Goals are: supervision to min assist. Therapy may proceed with showering this patient. 10. SLP will assess and treat for/with: n/a.  Goals are: n/a. 11. Case Management and Social Worker will assess and treat for psychological issues and discharge planning. 12. Team conference will be held weekly to assess progress toward goals and to determine barriers to discharge. 13. Patient will receive at least 3 hours of therapy per day at least 5 days per week. 14. ELOS: 10-12 days       15. Prognosis:  excellent   I have personally performed a face to face diagnostic evaluation of this patient and formulated the key components of the plan.  Additionally, I have personally reviewed laboratory data, imaging studies, as well as relevant notes and concur with  the physician assistant's documentation above.  Ranelle Oyster, MD, FAAPMR       Mcarthur Rossetti Angiulli, PA-C 06/10/2019

## 2019-06-10 NOTE — Progress Notes (Signed)
Occupational Therapy Treatment Patient Details Name: [name] Mathews MRN: [id number] DOB: 05-12-1934 Today's Date: 06/10/2019    History of present illness Pt is an 85 yo female s/p PLIF L2-3, interbody prosthesis explore fusion B/L L1-2 laminotomy. PMH including but not limited to DM, HTN and PVD.   OT comments  Pt remains lethargic and slow to respond. Continues to require +2 assist for all mobility and max to total assist for ADL. Updated d/c to CIR.   Follow Up Recommendations  CIR;Supervision/Assistance - 24 hour    Equipment Recommendations  3 in 1 bedside commode    Recommendations for Other Services      Precautions / Restrictions Precautions Precautions: Fall;Back Precaution Comments: educated in back precautions with bed mobility and donning back brace Required Braces or Orthoses: Spinal Brace Spinal Brace: Lumbar corset;Applied in sitting position       Mobility Bed Mobility Overal bed mobility: Needs Assistance Bed Mobility: Rolling;Sidelying to Sit Rolling: +2 for physical assistance;Max assist Sidelying to sit: +2 for safety/equipment;Mod assist       General bed mobility comments: step by step cues for log roll technique, assist for all aspects  Transfers Overall transfer level: Needs assistance Equipment used: Rolling walker (2 wheeled) Transfers: Sit to/from [facility] Sit to Stand: +2 physical assistance;Mod assist;Max assist Stand pivot transfers: +2 physical assistance;Max assist       General transfer comment: cues for hand placement and to bring feet back, assist with gait and under hips to stand, +2 max from bed upon second attempt, +2 mod from [facility]    Balance Overall balance assessment: Needs assistance Sitting-balance support: Feet supported;Bilateral upper extremity supported Sitting balance-Leahy Scale: Poor Sitting balance - Comments: posterior lean   Standing balance support: Bilateral upper extremity supported;During  functional activity Standing balance-Leahy Scale: Poor Standing balance comment: flexed posture, requires B UE support on walker and therapist assist                           ADL either performed or assessed with clinical judgement   ADL Overall ADL's : Needs assistance/impaired Eating/Feeding: Maximal assistance;Sitting Eating/Feeding Details (indicate cue type and reason): to drink from straw Grooming: Maximal assistance;Sitting;Wash/dry face Grooming Details (indicate cue type and reason): brought washcloth to face only         Upper Body Dressing : Maximal assistance;Sitting Upper Body Dressing Details (indicate cue type and reason): max to total for back brace and front opening gown     Toilet Transfer: +2 for physical assistance;Maximal assistance;Stand-pivot;BSC           Functional mobility during ADLs: Minimal assistance;Moderate assistance;+2 for physical assistance;Rolling walker       Vision       Perception     Praxis      Cognition Arousal/Alertness: Lethargic(aroused, but keeping eyes closed) Behavior During Therapy: Flat affect Overall Cognitive Status: Impaired/Different from baseline Area of Impairment: Orientation;Attention;Memory;Following commands;Safety/judgement;Awareness;Problem solving                 Orientation Level: Disoriented to;Time;Place Current Attention Level: Focused Memory: Decreased recall of precautions;Decreased short-term memory Following Commands: Follows one step commands inconsistently;Follows one step commands with increased time Safety/Judgement: Decreased awareness of safety;Decreased awareness of deficits Awareness: Intellectual Problem Solving: Slow processing;Decreased initiation;Requires verbal cues;Requires tactile cues;Difficulty sequencing General Comments: pt responding verbally and moving very slowly        Exercises     Shoulder Instructions  General Comments      Pertinent  Vitals/ Pain       Pain Assessment: No/denies pain Faces Pain Scale: No hurt  Home Living                                          Prior Functioning/Environment              Frequency  Min 2X/week        Progress Toward Goals  OT Goals(current goals can now be found in the care plan section)  Progress towards OT goals: Not progressing toward goals - comment(lethargy)  Acute Rehab OT Goals Patient Stated Goal: per daughter to go home OT Goal Formulation: With patient Time For Goal Achievement: 06/20/19 Potential to Achieve Goals: Good  Plan Discharge plan needs to be updated    Co-evaluation    PT/OT/SLP Co-Evaluation/Treatment: Yes Reason for Co-Treatment: For patient/therapist safety;Necessary to address cognition/behavior during functional activity   OT goals addressed during session: ADL's and self-care;Proper use of Adaptive equipment and DME      AM-PAC OT "6 Clicks" Daily Activity     Outcome Measure   Help from another person eating meals?: A Lot Help from another person taking care of personal grooming?: Total Help from another person toileting, which includes using toliet, bedpan, or urinal?: Total Help from another person bathing (including washing, rinsing, drying)?: A Lot Help from another person to put on and taking off regular upper body clothing?: A Lot Help from another person to put on and taking off regular lower body clothing?: Total 6 Click Score: 9    End of Session Equipment Utilized During Treatment: Gait belt;Rolling walker;Back brace  OT Visit Diagnosis: Unsteadiness on feet (R26.81);Muscle weakness (generalized) (M62.81)   Activity Tolerance Patient limited by lethargy   Patient Left in chair;with call bell/phone within reach;with chair alarm set   Nurse Communication Mobility status;Need for lift equipment        Time: [id number] OT Time Calculation (min): 33 min  Charges: OT General Charges $OT Visit: 1  Visit OT Treatments $Self Care/Home Management : 8-22 mins  Martie RoundJulie Verenis [place], OTR/L Acute Rehabilitation Services Pager: [phone] Office: [phone]  Evern BioMayberry, [place] 06/10/2019, 9:20 AM

## 2019-06-10 NOTE — Progress Notes (Signed)
Patient ID: [name] Mathews, female   DOB: 1934/03/06, 85 y.o.   MRN: [id number] Admit to unit, oriented to rehab, plan of care, therapy schedule and medication regimen. Margarito Liner

## 2019-06-10 NOTE — Care Management Important Message (Signed)
Important Message  Patient Details  Name: [name] Mathews MRN: [id number] Date of Birth: [birth year]   Medicare Important Message Given:  Yes     Orbie Pyo 06/10/2019, 3:32 PM

## 2019-06-10 NOTE — Progress Notes (Signed)
[name] Staggers, MD  Physician  Physical Medicine and Rehabilitation  PMR Pre-admission  Signed  Date of Service:  06/10/2019 1:12 PM      Related encounter: Admission (Discharged) from 06/05/2019 in [facility]      Signed         Show:Clear all [x] Manual[x] Template[x] Copied  Added by: [x] Cristina Gong, RN[x] [name] Staggers, MD  [] Hover for details PMR Admission Coordinator Pre-Admission Assessment  Patient: [name] Mathews is an 85 y.o., female MRN: [id number] DOB: 09/14/1934 Height: 5' 9.5" (176.5 cm) Weight: 96.6 kg  Insurance Information HMO:     PPO:      PCP:      IPA:      80/20:      OTHER: no HMO PRIMARY: Medicare a and b      Policy#: [id number]      Subscriber: pt Benefits:  Phone #: passport one online     Name: 8/10 Eff. Date: 10/31/2000     Deduct: $1408      Out of Pocket Max: none      Life Max: none CIR: 100%      SNF: 20 full days Outpatient: 80%     Co-Pay: 20% Home Health: 100%      Co-Pay: none DME: 80%     Co-Pay: 20% Providers: pt choice  SECONDARY: [employer]      Policy#: [id number]      Subscriber: pt  Medicaid Application Date:       Case Manager:  Disability Application Date:       Case Worker:   The "Data Collection Information Summary" for patients in Inpatient Rehabilitation Facilities with attached "Privacy Act Dustin Records" was provided and verbally reviewed with: Patient and Family  Emergency Contact Information         Contact Information    Name Relation Home Work Mobile   Mathews,[name] Daughter [phone]     [name] Mathews, [name] Mathews Daughter [phone]        Current Medical History  Patient Admitting Diagnosis: debility s/p lumbar fusion  History of Present Illness: 85 year old right-handed female history of diabetes mellitus, memory deficits, CKD stage II with creatinine baseline 1.25-1.57, hyperlipidemia hypertension, normocytic anemia as well as chronic back pain as  well as history of L3-4 and 4-5 decompression and fusion years ago. Presented 06/05/2019 with recurrent back pain radiating to the lower extremities. Lumbar MRI and imaging demonstrated stenosis spondylolisthesis with radiculopathy at L2-3. Underwent bilateral L2-3 and L1-2 laminotomy foraminotomies to decompress the bilateral L2 and L3 nerve roots as well as posterior lumbar interbody fusion 06/05/2019 per Dr. Arnoldo Morale. Back brace out of bed applied in sitting position. Hospital course pain management. Initial bouts of confusion monitoring use of narcotics. Patient did develop some serosanguineous drainage from wound site placed on Keflex 06/07/2019. Acute blood loss anemia 8.4 and monitored.   Patient's medical record from [facility] has been reviewed by the rehabilitation admission coordinator and physician.  Past Medical History      Past Medical History:  Diagnosis Date  . Anemia    hx of  . Arthritis   . Breast lump    hx of  . Cataract    left eye  . Chronic back pain   . Diabetes mellitus   . Hyperlipidemia   . Hypertension    sees Dr. Jarrett Ables, [facility] internal medicine  . Normochromic normocytic anemia 05/22/2017   Hb 10 MCV 85.6 No monoclonal spike on SPEP  Creatinine  1.3-1.6  . Peripheral vascular disease ([place])     Family History   family history is not on file.  Prior Rehab/Hospitalizations Has the patient had prior rehab or hospitalizations prior to admission? Yes  Has the patient had major surgery during 100 days prior to admission? Yes             Current Medications  Current Facility-Administered Medications:  .  0.9 %  sodium chloride infusion, 250 mL, Intravenous, Continuous, [name], MD, Last Rate: 1 mL/hr at 06/05/19 2003, 250 mL at 06/05/19 2003 .  acetaminophen (TYLENOL) tablet 650 mg, 650 mg, Oral, Q4H PRN, 650 mg at 06/08/19 0835 **OR** acetaminophen (TYLENOL) suppository 650 mg, 650 mg, Rectal, Q4H PRN,  [name], MD .  amLODipine (NORVASC) tablet 10 mg, 10 mg, Oral, Daily, [name], MD, 10 mg at 06/10/19 1059 .  bisacodyl (DULCOLAX) suppository 10 mg, 10 mg, Rectal, Daily PRN, [name], MD .  bupivacaine liposome (EXPAREL) 1.3 % injection 266 mg, 20 mL, Infiltration, Once, [name], MD .  cephALEXin [facility]) capsule 500 mg, 500 mg, Oral, BID, Bergman, Meghan D, NP, 500 mg at 06/10/19 1059 .  cyclobenzaprine (FLEXERIL) tablet 10 mg, 10 mg, Oral, TID PRN, [name], MD .  docusate sodium (COLACE) capsule 250 mg, 250 mg, Oral, Daily, [name], MD, 250 mg at 06/10/19 1104 .  ferrous sulfate tablet 325 mg, 325 mg, Oral, Q breakfast, [name], MD, 325 mg at 06/10/19 0800 .  fluticasone (FLONASE) 50 MCG/ACT nasal spray 1 spray, 1 spray, Each Nare, Daily PRN, [name], MD .  galantamine (RAZADYNE ER) 24 hr capsule 16 mg, 16 mg, Oral, QPM, [name], MD, 16 mg at 06/09/19 1818 .  irbesartan (AVAPRO) tablet 150 mg, 150 mg, Oral, Daily, 150 mg at 06/10/19 1059 **AND** hydrochlorothiazide (MICROZIDE) capsule 12.5 mg, 12.5 mg, Oral, Daily, [name], MD, 12.5 mg at 06/10/19 1059 .  insulin aspart (novoLOG) injection 0-20 Units, 0-20 Units, Subcutaneous, Q4H, [name], MD, 3 Units at 06/10/19 0454 .  lactated ringers infusion, , Intravenous, Continuous, Effie Berkshire, MD, Stopped at 06/08/19 1843 .  linaclotide (LINZESS) capsule 145 mcg, 145 mcg, Oral, Manya Silvas, MD, 145 mcg at 06/09/19 [id number] .  menthol-cetylpyridinium (CEPACOL) lozenge 3 mg, 1 lozenge, Oral, PRN **OR** phenol (CHLORASEPTIC) mouth spray 1 spray, 1 spray, Mouth/Throat, PRN, [name], MD, 1 spray at 06/05/19 1946 .  metFORMIN (GLUCOPHAGE) tablet 500 mg, 500 mg, Oral, BID WC, [name], MD, 500 mg at 06/10/19 0800 .  morphine 4 MG/ML injection 4 mg, 4 mg, Intravenous, Q2H PRN, [name], MD, 4 mg at 06/06/19 0414 .  multivitamin  with minerals tablet, , Oral, Daily, [name], MD, 1 tablet at 06/10/19 1059 .  ondansetron (ZOFRAN) tablet 4 mg, 4 mg, Oral, Q6H PRN **OR** ondansetron (ZOFRAN) injection 4 mg, 4 mg, Intravenous, Q6H PRN, [name], MD .  oxyCODONE (Oxy IR/ROXICODONE) immediate release tablet 10 mg, 10 mg, Oral, Q3H PRN, [name], MD .  oxyCODONE (Oxy IR/ROXICODONE) immediate release tablet 5 mg, 5 mg, Oral, Q3H PRN, [name], MD, 5 mg at 06/06/19 [phone] .  pioglitazone (ACTOS) tablet 30 mg, 30 mg, Oral, Daily, [name], MD, 30 mg at 06/10/19 1059 .  rosuvastatin (CRESTOR) tablet 5 mg, 5 mg, Oral, Daily, [name], MD, 5 mg at 06/10/19 1059 .  sodium chloride flush (NS) 0.9 % injection 3 mL, 3 mL, Intravenous, Q12H, [name], MD, 3 mL at 06/10/19  1059 .  sodium chloride flush (NS) 0.9 % injection 3 mL, 3 mL, Intravenous, PRN, [name], MD .  zolpidem [facility]) tablet 5 mg, 5 mg, Oral, QHS PRN, [name], MD  Patients Current Diet:     Diet Order                  Diet heart healthy/carb modified Room service appropriate? Yes; Fluid consistency: Thin  Diet effective now               Precautions / Restrictions Precautions Precautions: Fall, Back Precaution Booklet Issued: Yes (comment) Precaution Comments: educated in back precautions with bed mobility and donning back brace Spinal Brace: Lumbar corset, Applied in sitting position Restrictions Weight Bearing Restrictions: No   Has the patient had 2 or more falls or a fall with injury in the past year? No  Prior Activity Level Limited Community (1-2x/wk): Mod I with 3 prong cane pta  Prior Functional Level Self Care: Did the patient need help bathing, dressing, using the toilet or eating? Independent  Indoor Mobility: Did the patient need assistance with walking from room to room (with or without device)? Independent  Stairs: Did the patient need assistance with  internal or external stairs (with or without device)? Needed some help  Functional Cognition: Did the patient need help planning regular tasks such as shopping or remembering to take medications? Needed some help  Home Assistive Devices / Equipment Home Equipment: Shower seat, Bedside commode, Environmental consultant - 4 wheels, Wheelchair - manual  Prior Device Use: Indicate devices/aids used by the patient prior to current illness, exacerbation or injury? 3 prong cane  Current Functional Level Cognition  Overall Cognitive Status: Impaired/Different from baseline Current Attention Level: Focused Orientation Level: Oriented to person, Oriented to place Following Commands: Follows one step commands inconsistently, Follows one step commands with increased time Safety/Judgement: Decreased awareness of safety, Decreased awareness of deficits General Comments: pt responding verbally and moving very slowly    Extremity Assessment (includes Sensation/Coordination)  Upper Extremity Assessment: Generalized weakness  Lower Extremity Assessment: Generalized weakness RLE Deficits / Details: pain with mobility RLE Coordination: decreased gross motor    ADLs  Overall ADL's : Needs assistance/impaired Eating/Feeding: Maximal assistance, Sitting Eating/Feeding Details (indicate cue type and reason): to drink from straw Grooming: Maximal assistance, Sitting, Wash/dry face Grooming Details (indicate cue type and reason): brought washcloth to face only Upper Body Bathing: Minimal assistance, Sitting Lower Body Bathing: Moderate assistance, Sitting/lateral leans, Sit to/from stand, Cueing for safety, Cueing for sequencing Upper Body Dressing : Maximal assistance, Sitting Upper Body Dressing Details (indicate cue type and reason): max to total for back brace and front opening gown Lower Body Dressing: Moderate assistance, Sitting/lateral leans, Sit to/from stand, Cueing for safety, Cueing for sequencing Toilet  Transfer: +2 for physical assistance, Maximal assistance, Stand-pivot, BSC Toileting- Clothing Manipulation and Hygiene: Moderate assistance, Sitting/lateral lean, Sit to/from stand, Cueing for safety, Cueing for sequencing Functional mobility during ADLs: Minimal assistance, Moderate assistance, +2 for physical assistance, Rolling walker General ADL Comments: minA for UB ADL due to soreness s/p sx and modA for LB ADL.    Mobility  Overal bed mobility: Needs Assistance Bed Mobility: Rolling, Sidelying to Sit Rolling: +2 for physical assistance, Max assist Sidelying to sit: +2 for safety/equipment, Mod assist Sit to sidelying: Max assist, +2 for physical assistance General bed mobility comments: step by step cues for log roll technique, assist for all aspects    Transfers  Overall transfer level: Needs  assistance Equipment used: Rolling walker (2 wheeled) Transfers: Sit to/from Stand, [employer] Sit to Stand: +2 physical assistance, Mod assist, Max assist Stand pivot transfers: +2 physical assistance, Max assist General transfer comment: cues for hand placement and to bring feet back, assist with gait and under hips to stand, +2 max from bed upon second attempt, +2 mod from [facility]    Ambulation / Gait / Stairs / Wheelchair Mobility  Ambulation/Gait Ambulation/Gait assistance: Min assist, +2 physical assistance, +2 safety/equipment Gait Distance (Feet): 25 Feet Assistive device: Rolling walker (2 wheeled) Gait Pattern/deviations: Step-through pattern, Wide base of support, Trunk flexed, Decreased stride length General Gait Details: mulitmodal cues for upright posture and assistance to steady and guide RW; verbal cues for forward gaze. Gait velocity: decreased Gait velocity interpretation: <1.31 ft/sec, indicative of household ambulator    Posture / Balance Dynamic Sitting Balance Sitting balance - Comments: posterior lean Balance Overall balance assessment: Needs  assistance Sitting-balance support: Feet supported, Bilateral upper extremity supported Sitting balance-Leahy Scale: Poor Sitting balance - Comments: posterior lean Postural control: Posterior lean Standing balance support: Bilateral upper extremity supported, During functional activity Standing balance-Leahy Scale: Poor Standing balance comment: flexed posture, requires B UE support on walker and therapist assist    Special needs/care consideration BiPAP/CPAP n/a CPM n/a Continuous Drip IV  N/a Dialysis n/a Life Vest  N/a Oxygen  N/a Special Bed  N/a Trach Size  N/a Wound Vac n/a Skin surgical dressing with moderate serosanguinous drainage Bowel mgmt:  Continent LBM 8/4 complaints of constipation today Bladder mgmt: external catheter; had some retention requiring in and out caths postoperatively Diabetic mgmt: yes pta Behavioral consideration  Confusion new since anesthesia; daughter states typical after surgery for pt Chemo/radiation  N/a   Previous Home Environment  Living Arrangements: (daughter, [name] Mathews)  Lives With: Daughter Available Help at Discharge: Family, Available 24 hours/day(daughter conuslt one day per week and has caregiver there 9 ) Type of Home: House Home Layout: (inlaw suite in full basement) Alternate Level Stairs-Number of Steps: full flight Home Access: Ramped entrance Bathroom Shower/Tub: Multimedia programmer: Handicapped height Bathroom Accessibility: Yes How Accessible: Accessible via walker Home Care Services: (private aide one day per week) Additional Comments: pt reports she uses a cane occasionally for mobility  Discharge Living Setting Plans for Discharge Living Setting: Lives with (comment)(daughter, [name]) Type of Home at Discharge: House Discharge Home Layout: Multi-level(lives in inlaw suite in full basement. COmes up daily to be ) Alternate Level Stairs-Number of Steps: flight Discharge Home Access: [place] entrance  Discharge Bathroom Shower/Tub: Walk-in shower Discharge Bathroom Toilet: Handicapped height Discharge Bathroom Accessibility: Yes How Accessible: Accessible via walker Does the patient have any problems obtaining your medications?: No  Social/Family/Support Systems Patient Roles: Parent Contact Information: daughter, [name] Mathews Anticipated Caregiver: daughter, [name] Mathews will also be her designated visitor Anticipated Ambulance person Information: see above Ability/Limitations of Caregiver: consult once weekly otherwise in the home Caregiver Availability: 24/7 Discharge Plan Discussed with Primary Caregiver: Yes Is Caregiver In Agreement with Plan?: Yes Does Caregiver/Family have Issues with Lodging/Transportation while Pt is in Rehab?: No  Goals/Additional Needs Patient/Family Goal for Rehab: supervision PT, supervision to min assist OT Expected length of stay: ELOS 10 - 12 days Pt/Family Agrees to Admission and willing to participate: Yes Program Orientation Provided & Reviewed with Pt/Caregiver Including Roles  & Responsibilities: Yes  Decrease burden of Care through IP rehab admission: n/a  Possible need for SNF placement upon discharge: not anticipated  Patient Condition: I  have reviewed medical records from [facility] , spoken with CM, and patient and daughter. I met with patient at the bedside for inpatient rehabilitation assessment.  Patient will benefit from ongoing PT and OT, can actively participate in 3 hours of therapy a day 5 days of the week, and can make measurable gains during the admission.  Patient will also benefit from the coordinated team approach during an Inpatient Acute Rehabilitation admission.  The patient will receive intensive therapy as well as Rehabilitation physician, nursing, social worker, and care management interventions.  Due to bladder management, bowel management, safety, skin/wound care, disease management, medication administration, pain  management and patient education the patient requires 24 hour a day rehabilitation nursing.  The patient is currently mod to max assist with mobility and basic ADLs.  Discharge setting and therapy post discharge at home with home health is anticipated.  Patient has agreed to participate in the Acute Inpatient Rehabilitation Program and will admit today.  Preadmission Screen Completed By:  Cleatrice Burke RN MSN 06/10/2019 1:12 PM ______________________________________________________________________   Discussed status with Dr. Naaman Plummer  on  06/10/2019 at  63 and received approval for admission today.  Admission Coordinator:  Cleatrice Burke, RN, time  1314 Date  06/10/2019   Assessment/Plan: Diagnosis: lumbar spondylosis/stenosis s/p Lumbar lami/fusion 1. Does the need for close, 24 hr/day Medical supervision in concert with the patient's rehab needs make it unreasonable for this patient to be served in a less intensive setting? Yes 2. Co-Morbidities requiring supervision/potential complications: dm, ckd, anemia, htn 3. Due to bladder management, bowel management, safety, skin/wound care, disease management, medication administration, pain management and patient education, does the patient require 24 hr/day rehab nursing? Yes 4. Does the patient require coordinated care of a physician, rehab nurse, PT (1-2 hrs/day, 5 days/week) and OT (1-2 hrs/day, 5 days/week) to address physical and functional deficits in the context of the above medical diagnosis(es)? Yes Addressing deficits in the following areas: balance, endurance, locomotion, strength, transferring, bowel/bladder control, bathing, dressing, feeding, grooming, toileting and psychosocial support 5. Can the patient actively participate in an intensive therapy program of at least 3 hrs of therapy 5 days a week? Yes 6. The potential for patient to make measurable gains while on inpatient rehab is excellent 7. Anticipated functional  outcomes upon discharge from inpatients are: supervision PT, supervision and min assist OT, n/a SLP 8. Estimated rehab length of stay to reach the above functional goals is: 10-12 days 9. Anticipated D/C setting: Home 10. Anticipated post D/C treatments: [place] therapy 11. Overall Rehab/Functional Prognosis: excellent  MD Signature: [name] Staggers, MD, [place] [facility] 06/10/2019         Revision History

## 2019-06-10 NOTE — TOC Transition Note (Signed)
Transition of Care [facility]) - CM/SW Discharge Note   Patient Details  Name: [name] Mathews MRN: [id number] Date of Birth: 21-Sep-1934  [facility] CM/SW Contact:  Pollie Friar, RN Phone Number: 06/10/2019, 1:55 PM   Clinical Narrative:    Pt is discharging to CIR today. CM signing off.   Final next level of care: IP Rehab Facility Barriers to Discharge: No Barriers Identified   Patient Goals and CMS Choice Patient states their goals for this hospitalization and ongoing recovery are:: Pt daughter would like her mother to return home with home health services CMS Medicare.gov Compare Post Acute Care list provided to:: Patient Represenative (must comment) Choice offered to / list presented to : Adult Children, HC POA / Guardian  Discharge Placement                       Discharge Plan and Services In-house Referral: Clinical Social Work   Post Acute Care Choice: Home Health          DME Arranged: Walker rolling with seat, Eelevated commode seat DME Agency: [employer] Date DME Agency Contacted: 06/08/19 Time DME Agency Contacted: [id number] Representative spoke with at DME Agency: [place]: PT, OT, Nurse's Aide [place] Agency: [place] ([place]) Date Protection: 06/08/19 Time [place]: [phone] Representative spoke with at [place]: [place] ([place]) Interventions     Readmission Risk Interventions No flowsheet data found.

## 2019-06-10 NOTE — Discharge Summary (Signed)
Physician Discharge Summary  Patient ID: [name] Mathews MRN: [id number] DOB/AGE: [birth year] 85 y.o.  Admit date: 06/05/2019 Discharge date: 06/10/2019  Admission Diagnoses:Lumbar adjacent segment disease with spondylolisthesis and spinal stenosis, lumbago, lumbar radiculopathy, neurogenic claudication  Discharge Diagnoses:  The same and urinary retention, acute blood loss anemia, hyponatremia Active Problems:   Lumbar adjacent segment disease with spondylolisthesis   Discharged Condition: fair  Hospital Course:  I performed an exploration of the patient's lumbar fusion with an L2-3 decompression, instrumentation, fusion and L1-2 laminotomies on 06/05/2019.  The surgery went well.    The patient's postoperative course was remarkable for some mild confusion which was attributed to hospitalization, recent surgery, meds, etc.  She has some urinary retention which is worked up with a urinalysis and cultures which turned out negative.  This resolved.    She also had an elevated creatinine.  This was observed and return to his baseline.    She has chronic anemia which was temporally worsened after surgery.  Her hemoglobin has been stable on CBCs.  She is on iron.    The patient was treated by PT and OT.  A rehab consult was obtained.  She was felt to be a good patient to benefit from inpatient rehab.  She was transferred to the inpatient rehab unit on 06/05/2019.  Consults:  PT, OT, rehab,  Care management Significant Diagnostic Studies: none Treatments: exploration of lumbar fusion, L2-3 decompression, instrumentation and fusion, bilateral L1-2 laminotomies Discharge Exam: Blood pressure 135/61, pulse 80, temperature 99.8 F (37.7 C), temperature source Oral, resp. rate 16, height 5' 9.5" (1.765 m), weight 96.6 kg, SpO2 95 %.  the patient is alert and pleasant.  Her strength is normal.  Disposition:   Rehab for  Discharge Instructions    Call MD for:  difficulty breathing, headache or  visual disturbances   Complete by: As directed    Call MD for:  extreme fatigue   Complete by: As directed    Call MD for:  hives   Complete by: As directed    Call MD for:  persistant dizziness or light-headedness   Complete by: As directed    Call MD for:  persistant nausea and vomiting   Complete by: As directed    Call MD for:  redness, tenderness, or signs of infection (pain, swelling, redness, odor or green/yellow discharge around incision site)   Complete by: As directed    Call MD for:  severe uncontrolled pain   Complete by: As directed    Call MD for:  temperature >100.4   Complete by: As directed    Diet - low sodium heart healthy   Complete by: As directed    Discharge instructions   Complete by: As directed    Call [phone] for a followup appointment. Take a stool softener while you are using pain medications.   Driving Restrictions   Complete by: As directed    Do not drive for 2 weeks.   Increase activity slowly   Complete by: As directed    Lifting restrictions   Complete by: As directed    Do not lift more than 5 pounds. No excessive bending or twisting.   May shower / Bathe   Complete by: As directed    Remove the dressing for 3 days after surgery.  You may shower, but leave the incision alone.   Remove dressing in 48 hours   Complete by: As directed    Your stitches are under the scan  and will dissolve by themselves. The Steri-Strips will fall off after you take a few showers. Do not rub back or pick at the wound, Leave the wound alone.     Allergies as of 06/10/2019      Reactions   Januvia [sitagliptin] Other (See Comments)   Numbness in mouth   Simvastatin Other (See Comments)   tremors      Medication List    STOP taking these medications   acetaminophen 650 MG CR tablet Commonly known as: TYLENOL     TAKE these medications   amLODipine 10 MG tablet Commonly known as: NORVASC Take 10 mg by mouth daily.   aspirin 81 MG tablet Take 81  mg by mouth daily.   docusate sodium 250 MG capsule Commonly known as: COLACE Take 250 mg by mouth daily.   fluticasone 50 MCG/ACT nasal spray Commonly known as: FLONASE Place 1 spray into both nostrils daily as needed for allergies or rhinitis.   galantamine 16 MG 24 hr capsule Commonly known as: RAZADYNE ER Take 16 mg by mouth every evening.   Linzess 145 MCG Caps capsule Generic drug: linaclotide Take 145 mcg by mouth every other day.   metFORMIN 500 MG tablet Commonly known as: GLUCOPHAGE Take 500 mg by mouth 2 (two) times daily with a meal.   MULTIVITAMIN PO Take 2 each by mouth daily.   OVER THE COUNTER MEDICATION Take 30 mLs by mouth daily. Black Seed Oil   pioglitazone 30 MG tablet Commonly known as: ACTOS Take 30 mg by mouth daily.   PROBIOTIC DIGESTIVE SUPPORT PO Take 2 each by mouth every morning.   rosuvastatin 5 MG tablet Commonly known as: CRESTOR Take 5 mg by mouth daily.   SLOW RELEASE IRON PO Take 1 tablet by mouth daily.   telmisartan-hydrochlorothiazide 40-12.5 MG tablet Commonly known as: MICARDIS HCT Take 1 tablet by mouth daily.   vitamin B-12 1000 MCG tablet Commonly known as: CYANOCOBALAMIN Take 1,000 mcg by mouth daily. OTC   vitamin C 500 MG tablet Commonly known as: ASCORBIC ACID Take 500 mg by mouth daily.   Vitamin D3 125 MCG (5000 UT) Tabs Take 1 tablet by mouth daily.      Follow-up Information    [facility] Follow [facility].   Specialty: Home Health Services Why: Someone with [facility] will contact you to set up your initial appointment. You will receive home health physical therapy, occupational therapy, and a nurse aide.           Signed: Cristi LoronJeffrey D Vasiliy Mccarry 06/10/2019, 1:13 PM

## 2019-06-10 NOTE — H&P (Signed)
Physical Medicine and Rehabilitation Admission H&P    Chief complaint back pain: HPI: [name] Mathews is an 85 year old right-handed female history of diabetes mellitus, memory deficits, CKD stage II with creatinine baseline 1.25-1.57, hyperlipidemia hypertension, normocytic anemia as well as chronic back pain as well as history of L3-4 and 4-5 decompression and fusion years ago.  Per chart review independent with assistive device prior to admission.  She lives with his daughter.  Two-level level home with ramped entrance with bed and bath on main level.  Patient had been receiving home health therapies for chronic back pain.  Presented 06/05/2019 with recurrent back pain radiating to the lower extremities.  Lumbar MRI and imaging demonstrated stenosis spondylolisthesis with radiculopathy at L2-3.  Underwent bilateral L2-3 and L1-2 laminotomy foraminotomies to decompress the bilateral L2 and L3 nerve roots as well as posterior lumbar interbody fusion 06/05/2019 per Dr. Lovell SheehanJenkins.  Back brace out of bed applied in sitting position.  Hospital course pain management.  Initial bouts of confusion monitoring use of narcotics.  Patient did develop some serosanguineous drainage from wound site placed on Keflex 06/07/2019.  Acute blood loss anemia 8.4 and monitored.  Therapy evaluations completed and patient was admitted for a comprehensive rehab program.  Review of Systems  Constitutional: Negative for chills and fever.  HENT: Negative for hearing loss.   Eyes: Negative for blurred vision and double vision.  Respiratory: Negative for cough and shortness of breath.   Cardiovascular: Positive for leg swelling. Negative for chest pain and palpitations.  Gastrointestinal: Positive for constipation. Negative for heartburn, nausea and vomiting.  Genitourinary: Negative for dysuria, flank pain and hematuria.  Musculoskeletal: Positive for back pain and myalgias.  Skin: Negative for rash.  Neurological: Negative for  seizures.  All other systems reviewed and are negative.  Past Medical History:  Diagnosis Date  . Anemia    hx of  . Arthritis   . Breast lump    hx of  . Cataract    left eye  . Chronic back pain   . Diabetes mellitus   . Hyperlipidemia   . Hypertension    [facility]  . Normochromic normocytic anemia 05/22/2017   Hb 10 MCV 85.6 No monoclonal spike on SPEP  Creatinine 1.3-1.6  . Peripheral vascular disease [facility](HCC)    Past Surgical History:  Procedure Laterality Date  . APPENDECTOMY    . BACK SURGERY    . BREAST CYST EXCISION Left   . BREAST SURGERY     cyst removed left side  . BUNIONECTOMY    . CARDIOVASCULAR STRESS TEST    . COLONOSCOPY    . DILATION AND CURETTAGE OF UTERUS    . TONSILLECTOMY     History reviewed. No pertinent family history. Social History:  reports that she has never smoked. She has never used smokeless tobacco. She reports that she does not drink alcohol or use drugs. Allergies:  Allergies  Allergen Reactions  . Januvia [Sitagliptin] Other (See Comments)    Numbness in mouth  . Simvastatin Other (See Comments)    tremors   Medications Prior to Admission  Medication Sig Dispense Refill  . acetaminophen (TYLENOL) 650 MG CR tablet Take 1,300 mg by mouth every 8 (eight) hours as needed for pain.    Marland Kitchen. amLODipine (NORVASC) 10 MG tablet Take 10 mg by mouth daily.    Marland Kitchen. aspirin 81 MG tablet Take 81 mg by mouth daily.    . Cholecalciferol (  VITAMIN D3) 5000 units TABS Take 1 tablet by mouth daily.    Marland Kitchen docusate sodium (COLACE) 250 MG capsule Take 250 mg by mouth daily.    . Ferrous Sulfate (SLOW RELEASE IRON PO) Take 1 tablet by mouth daily.    . fluticasone (FLONASE) 50 MCG/ACT nasal spray Place 1 spray into both nostrils daily as needed for allergies or rhinitis.    Marland Kitchen galantamine (RAZADYNE ER) 16 MG 24 hr capsule Take 16 mg by mouth every evening.    . Lactobacillus-Inulin (PROBIOTIC DIGESTIVE SUPPORT PO) Take  2 each by mouth every morning.    . linaclotide (LINZESS) 145 MCG CAPS capsule Take 145 mcg by mouth every other day.    . metFORMIN (GLUCOPHAGE) 500 MG tablet Take 500 mg by mouth 2 (two) times daily with a meal.    . Multiple Vitamins-Minerals (MULTIVITAMIN PO) Take 2 each by mouth daily.     Marland Kitchen OVER THE COUNTER MEDICATION Take 30 mLs by mouth daily. Black Seed Oil    . pioglitazone (ACTOS) 30 MG tablet Take 30 mg by mouth daily.    . rosuvastatin (CRESTOR) 5 MG tablet Take 5 mg by mouth daily.     Marland Kitchen telmisartan-hydrochlorothiazide (MICARDIS HCT) 40-12.5 MG tablet Take 1 tablet by mouth daily.    . vitamin B-12 (CYANOCOBALAMIN) 1000 MCG tablet Take 1,000 mcg by mouth daily. OTC    . vitamin C (ASCORBIC ACID) 500 MG tablet Take 500 mg by mouth daily.      Drug Regimen Review Drug regimen was reviewed and remains appropriate with no significant issues identified  Home: Home Living Family/patient expects to be discharged to:: Private residence Living Arrangements: Children(daughter) Available Help at Discharge: Available PRN/intermittently, Family Type of Home: House Home Access: Ramped entrance Home Layout: Able to live on main level with bedroom/bathroom, Multi-level Alternate Level Stairs-Number of Steps: full flight Bathroom Shower/Tub: Multimedia programmer: Handicapped height Home Equipment: Civil engineer, contracting, Bedside commode, Environmental consultant - 4 wheels, Wheelchair - manual Additional Comments: pt reports she uses a cane occasionally for mobility   Functional History: Prior Function Level of Independence: Independent with assistive device(s) Comments: Pt reports independence with ADL and assist for IADLs.  Functional Status:  Mobility: Bed Mobility Overal bed mobility: Needs Assistance Bed Mobility: Rolling, Sidelying to Sit Rolling: +2 for physical assistance, Max assist Sidelying to sit: +2 for safety/equipment, Mod assist Sit to sidelying: Max assist, +2 for physical  assistance General bed mobility comments: step by step cues for log roll technique, assist for all aspects Transfers Overall transfer level: Needs assistance Equipment used: Rolling walker (2 wheeled) Transfers: Sit to/from Stand, Stand Pivot Transfers Sit to Stand: +2 physical assistance, Mod assist, Max assist Stand pivot transfers: +2 physical assistance, Max assist General transfer comment: cues for hand placement and to bring feet back, assist with gait and under hips to stand, +2 max from bed upon second attempt, +2 mod from [facility] Ambulation/Gait Ambulation/Gait assistance: Min assist, +2 physical assistance, +2 safety/equipment Gait Distance (Feet): 25 Feet Assistive device: Rolling walker (2 wheeled) Gait Pattern/deviations: Step-through pattern, Wide base of support, Trunk flexed, Decreased stride length General Gait Details: mulitmodal cues for upright posture and assistance to steady and guide RW; verbal cues for forward gaze. Gait velocity: decreased Gait velocity interpretation: <1.31 ft/sec, indicative of household ambulator    ADL: ADL Overall ADL's : Needs assistance/impaired Eating/Feeding: Maximal assistance, Sitting Eating/Feeding Details (indicate cue type and reason): to drink from straw Grooming: Maximal assistance, Sitting, Wash/dry  face Grooming Details (indicate cue type and reason): brought washcloth to face only Upper Body Bathing: Minimal assistance, Sitting Lower Body Bathing: Moderate assistance, Sitting/lateral leans, Sit to/from stand, Cueing for safety, Cueing for sequencing Upper Body Dressing : Maximal assistance, Sitting Upper Body Dressing Details (indicate cue type and reason): max to total for back brace and front opening gown Lower Body Dressing: Moderate assistance, Sitting/lateral leans, Sit to/from stand, Cueing for safety, Cueing for sequencing Toilet Transfer: +2 for physical assistance, Maximal assistance, Stand-pivot, BSC Toileting-  Clothing Manipulation and Hygiene: Moderate assistance, Sitting/lateral lean, Sit to/from stand, Cueing for safety, Cueing for sequencing Functional mobility during ADLs: Minimal assistance, Moderate assistance, +2 for physical assistance, Rolling walker General ADL Comments: minA for UB ADL due to soreness s/p sx and modA for LB ADL.  Cognition: Cognition Overall Cognitive Status: Impaired/Different from baseline Orientation Level: Oriented to person, Oriented to place Cognition Arousal/Alertness: Lethargic(aroused, but keeping eyes closed) Behavior During Therapy: Flat affect Overall Cognitive Status: Impaired/Different from baseline Area of Impairment: Orientation, Attention, Memory, Following commands, Safety/judgement, Awareness, Problem solving Orientation Level: Disoriented to, Time, Place Current Attention Level: Focused Memory: Decreased recall of precautions, Decreased short-term memory Following Commands: Follows one step commands inconsistently, Follows one step commands with increased time Safety/Judgement: Decreased awareness of safety, Decreased awareness of deficits Awareness: Intellectual Problem Solving: Slow processing, Decreased initiation, Requires verbal cues, Requires tactile cues, Difficulty sequencing General Comments: pt responding verbally and moving very slowly  Physical Exam: Blood pressure 135/61, pulse 80, temperature 99.8 F (37.7 C), temperature source Oral, resp. rate 16, height 5' 9.5" (1.765 m), weight 96.6 kg, SpO2 95 %. Physical Exam  Constitutional: She appears well-developed and well-nourished.  obese  HENT:  Head: Normocephalic and atraumatic.  Eyes: Pupils are equal, round, and reactive to light. Conjunctivae are normal.  Neck: Normal range of motion. No thyromegaly present.  Cardiovascular: Normal rate. Exam reveals no friction rub.  No murmur heard. Respiratory: Effort normal. No respiratory distress. She has no wheezes.  GI: Soft. She  exhibits distension. There is no abdominal tenderness.  Bowel sounds decreased  Musculoskeletal:     Comments: LB TTP, wearing LSO  Neurological: She is alert.  Patient is alert sitting up in chair with daughter at bedside. Oriented to person, place. Month with cues.   Mood is a bit flat but appropriate.  Follows commands.  Makes good eye contact with examiner.    She was a provider name and age but limited overall with recall of hospital course. UE 4/5 although grip on left is 3+ to 4-. LE: 3+ HF, 4- KE and 4/5 ADF/PF.   Skin:  Back incision with s/s drainage.   Psychiatric:  Flat but overall pleasant    Results for orders placed or performed during the hospital encounter of 06/05/19 (from the past 48 hour(s))  Glucose, capillary     Status: Abnormal   Collection Time: 06/08/19  4:11 PM  Result Value Ref Range   Glucose-Capillary 171 (H) 70 - 99 mg/dL  Glucose, capillary     Status: Abnormal   Collection Time: 06/08/19  8:17 PM  Result Value Ref Range   Glucose-Capillary 130 (H) 70 - 99 mg/dL  Glucose, capillary     Status: Abnormal   Collection Time: 06/09/19  1:56 AM  Result Value Ref Range   Glucose-Capillary 147 (H) 70 - 99 mg/dL  Glucose, capillary     Status: None   Collection Time: 06/09/19  4:25 AM  Result Value Ref  Range   Glucose-Capillary 98 70 - 99 mg/dL  Glucose, capillary     Status: Abnormal   Collection Time: 06/09/19  9:12 AM  Result Value Ref Range   Glucose-Capillary 113 (H) 70 - 99 mg/dL   Comment 1 Notify RN    Comment 2 Document in Chart   Glucose, capillary     Status: Abnormal   Collection Time: 06/09/19 12:36 PM  Result Value Ref Range   Glucose-Capillary 136 (H) 70 - 99 mg/dL   Comment 1 Notify RN    Comment 2 Document in Chart   Glucose, capillary     Status: Abnormal   Collection Time: 06/09/19  4:09 PM  Result Value Ref Range   Glucose-Capillary 157 (H) 70 - 99 mg/dL   Comment 1 Notify RN    Comment 2 Document in Chart   Glucose, capillary      Status: Abnormal   Collection Time: 06/09/19  8:17 PM  Result Value Ref Range   Glucose-Capillary 143 (H) 70 - 99 mg/dL  Glucose, capillary     Status: Abnormal   Collection Time: 06/10/19 12:41 AM  Result Value Ref Range   Glucose-Capillary 104 (H) 70 - 99 mg/dL  Glucose, capillary     Status: Abnormal   Collection Time: 06/10/19  4:40 AM  Result Value Ref Range   Glucose-Capillary 123 (H) 70 - 99 mg/dL  Glucose, capillary     Status: Abnormal   Collection Time: 06/10/19  7:46 AM  Result Value Ref Range   Glucose-Capillary 104 (H) 70 - 99 mg/dL   Comment 1 Notify RN    Comment 2 Document in Chart   Glucose, capillary     Status: Abnormal   Collection Time: 06/10/19 11:34 AM  Result Value Ref Range   Glucose-Capillary 176 (H) 70 - 99 mg/dL   Comment 1 Notify RN    Comment 2 Document in Chart    No results found.     Medical Problem List and Plan: 1.  Decreased functional mobility secondary to L2-3 spondylolisthesis, L1-2 and 2-3 spinal stenosis compressing L2 and III nerve roots with lumbar radiculopathy.  Status post L2-3 and L1-2 laminotomy foraminotomies posterior lumbar interbody fusion 06/05/2019.    -admit to inpatient rehab  -LSO when out of bed.  2.  Antithrombotics: -DVT/anticoagulation: SCDs.  Check vascular study  -antiplatelet therapy: N/A 3. Pain Management: Flexeril 10 mg 3 times daily as needed, oxycodone 5 mg every 3 hours as needed.  Monitor mental status as pt demonstrating delayed processing some disorientation today 4. Mood: Rizadyne 16 mg daily  -antipsychotic agents: N/A 5. Neuropsych: This patient is capable of making decisions on his own behalf. 6. Skin/Wound Care: Routine skin checks 7. Fluids/Electrolytes/Nutrition: Routine in and outs with follow-up chemistries 8.  ID.  Keflex 500 mg twice daily x7 days for serosanguineous back drainage. 9.  Acute on chronic anemia.  Follow-up CBC.  Continue iron supplement 10.  Diabetes mellitus.  Hemoglobin  A1c 6.5.  Glucophage 500 mg twice daily, Actos 30 mg daily.  11.  Hypertension.  Avapro 150 mg daily, HCTZ 12.5 mg daily, Norvasc 10 mg daily.  Monitor with increased mobility 12.  CKD stage II.  Creatinine baseline 1.25-1.57.  Follow-up chemistries 13.  Hyperlipidemia.  Crestor 14.  Constipation.  Continue Linzess  -sorbitol +/- enema today if no BM by dinner time.      Mcarthur RossettiDaniel J Angiulli, PA-C 06/10/2019

## 2019-06-10 NOTE — PMR Pre-admission (Signed)
PMR Admission Coordinator Pre-Admission Assessment  Patient: [name] Mathews is an 85 y.o., female MRN: [id number] DOB: [birth year] Height: 5' 9.5" (176.5 cm) Weight: 96.6 kg  Insurance Information HMO:     PPO:      PCP:      IPA:      80/20:      OTHER: no HMO PRIMARY: Medicare a and b      Policy#: [id number]      Subscriber: pt Benefits:  Phone #: passport one online     Name: 8/10 Eff. Date: 10/31/2000     Deduct: $1408      Out of Pocket Max: none      Life Max: none CIR: 100%      SNF: 20 full days Outpatient: 80%     Co-Pay: 20% Home Health: 100%      Co-Pay: none DME: 80%     Co-Pay: 20% Providers: pt choice  SECONDARY: [employer]      Policy#: [id number]      Subscriber: pt  Medicaid Application Date:       Case Manager:  Disability Application Date:       Case Worker:   The "Data Collection Information Summary" for patients in Inpatient Rehabilitation Facilities with attached "Privacy Act [place] Records" was provided and verbally reviewed with: Patient and Family  Emergency Contact Information Contact Information    Name Relation Home Work Mobile   Crawford,[name] Daughter [phone]     Blakley, Michna Daughter [phone]        Current Medical History  Patient Admitting Diagnosis: debility s/p lumbar fusion  History of Present Illness: 85 year old right-handed female history of diabetes mellitus, memory deficits, CKD stage II with creatinine baseline 1.25-1.57, hyperlipidemia hypertension, normocytic anemia as well as chronic back pain as well as history of L3-4 and 4-5 decompression and fusion years ago.  Presented 06/05/2019 with recurrent back pain radiating to the lower extremities.  Lumbar MRI and imaging demonstrated stenosis spondylolisthesis with radiculopathy at L2-3.  Underwent bilateral L2-3 and L1-2 laminotomy foraminotomies to decompress the bilateral L2 and L3 nerve roots as well as posterior lumbar interbody fusion 06/05/2019 per Dr. Arnoldo Morale.  Back  brace out of bed applied in sitting position.  Hospital course pain management.  Initial bouts of confusion monitoring use of narcotics.  Patient did develop some serosanguineous drainage from wound site placed on Keflex 06/07/2019.  Acute blood loss anemia 8.4 and monitored.   Patient's medical record from [facility] has been reviewed by the rehabilitation admission coordinator and physician.  Past Medical History  Past Medical History:  Diagnosis Date  . Anemia    hx of  . Arthritis   . Breast lump    hx of  . Cataract    left eye  . Chronic back pain   . Diabetes mellitus   . Hyperlipidemia   . Hypertension    sees Dr. Jarrett Ables, [facility] internal medicine  . Normochromic normocytic anemia 05/22/2017   Hb 10 MCV 85.6 No monoclonal spike on SPEP  Creatinine 1.3-1.6  . Peripheral vascular disease ([place])     Family History   family history is not on file.  Prior Rehab/Hospitalizations Has the patient had prior rehab or hospitalizations prior to admission? Yes  Has the patient had major surgery during 100 days prior to admission? Yes   Current Medications  Current Facility-Administered Medications:  .  0.9 %  sodium chloride infusion, 250 mL, Intravenous, Continuous, [name], MD, Last Rate:  1 mL/hr at 06/05/19 2003, 250 mL at 06/05/19 2003 .  acetaminophen (TYLENOL) tablet 650 mg, 650 mg, Oral, Q4H PRN, 650 mg at 06/08/19 0835 **OR** acetaminophen (TYLENOL) suppository 650 mg, 650 mg, Rectal, Q4H PRN, [name], MD .  amLODipine (NORVASC) tablet 10 mg, 10 mg, Oral, Daily, [name], MD, 10 mg at 06/10/19 1059 .  bisacodyl (DULCOLAX) suppository 10 mg, 10 mg, Rectal, Daily PRN, [name], MD .  bupivacaine liposome (EXPAREL) 1.3 % injection 266 mg, 20 mL, Infiltration, Once, [name], MD .  [facility]) capsule 500 mg, 500 mg, Oral, BID, Bergman, Meghan D, NP, 500 mg at 06/10/19 1059 .  cyclobenzaprine (FLEXERIL) tablet  10 mg, 10 mg, Oral, TID PRN, [name], MD .  docusate sodium (COLACE) capsule 250 mg, 250 mg, Oral, Daily, [name], MD, 250 mg at 06/10/19 1104 .  ferrous sulfate tablet 325 mg, 325 mg, Oral, Q breakfast, [name], MD, 325 mg at 06/10/19 0800 .  fluticasone (FLONASE) 50 MCG/ACT nasal spray 1 spray, 1 spray, Each Nare, Daily PRN, [name], MD .  galantamine (RAZADYNE ER) 24 hr capsule 16 mg, 16 mg, Oral, QPM, [name], MD, 16 mg at 06/09/19 1818 .  irbesartan (AVAPRO) tablet 150 mg, 150 mg, Oral, Daily, 150 mg at 06/10/19 1059 **AND** hydrochlorothiazide (MICROZIDE) capsule 12.5 mg, 12.5 mg, Oral, Daily, [name], MD, 12.5 mg at 06/10/19 1059 .  insulin aspart (novoLOG) injection 0-20 Units, 0-20 Units, Subcutaneous, Q4H, [name], MD, 3 Units at 06/10/19 0454 .  lactated ringers infusion, , Intravenous, Continuous, Effie Berkshire, MD, Stopped at 06/08/19 1843 .  linaclotide (LINZESS) capsule 145 mcg, 145 mcg, Oral, Manya Silvas, MD, 145 mcg at 06/09/19 [phone] .  menthol-cetylpyridinium (CEPACOL) lozenge 3 mg, 1 lozenge, Oral, PRN **OR** phenol (CHLORASEPTIC) mouth spray 1 spray, 1 spray, Mouth/Throat, PRN, [name], MD, 1 spray at 06/05/19 1946 .  metFORMIN (GLUCOPHAGE) tablet 500 mg, 500 mg, Oral, BID WC, [name], MD, 500 mg at 06/10/19 0800 .  morphine 4 MG/ML injection 4 mg, 4 mg, Intravenous, Q2H PRN, [name], MD, 4 mg at 06/06/19 0414 .  multivitamin with minerals tablet, , Oral, Daily, [name], MD, 1 tablet at 06/10/19 1059 .  ondansetron (ZOFRAN) tablet 4 mg, 4 mg, Oral, Q6H PRN **OR** ondansetron (ZOFRAN) injection 4 mg, 4 mg, Intravenous, Q6H PRN, [name], MD .  oxyCODONE (Oxy IR/ROXICODONE) immediate release tablet 10 mg, 10 mg, Oral, Q3H PRN, [name], MD .  oxyCODONE (Oxy IR/ROXICODONE) immediate release tablet 5 mg, 5 mg, Oral, Q3H PRN, [name], MD, 5 mg at  06/06/19 [phone] .  pioglitazone (ACTOS) tablet 30 mg, 30 mg, Oral, Daily, [name], MD, 30 mg at 06/10/19 1059 .  rosuvastatin (CRESTOR) tablet 5 mg, 5 mg, Oral, Daily, [name], MD, 5 mg at 06/10/19 1059 .  sodium chloride flush (NS) 0.9 % injection 3 mL, 3 mL, Intravenous, Q12H, [name], MD, 3 mL at 06/10/19 1059 .  sodium chloride flush (NS) 0.9 % injection 3 mL, 3 mL, Intravenous, PRN, [name], MD .  zolpidem [facility]) tablet 5 mg, 5 mg, Oral, QHS PRN, [name], MD  Patients Current Diet:  Diet Order            Diet heart healthy/carb modified Room service appropriate? Yes; Fluid consistency: Thin  Diet effective now              Precautions / Restrictions Precautions Precautions: Fall,  Back Precaution Booklet Issued: Yes (comment) Precaution Comments: educated in back precautions with bed mobility and donning back brace Spinal Brace: Lumbar corset, Applied in sitting position Restrictions Weight Bearing Restrictions: No   Has the patient had 2 or more falls or a fall with injury in the past year? No  Prior Activity Level Limited Community (1-2x/wk): Mod I with 3 prong cane pta  Prior Functional Level Self Care: Did the patient need help bathing, dressing, using the toilet or eating? Independent  Indoor Mobility: Did the patient need assistance with walking from room to room (with or without device)? Independent  Stairs: Did the patient need assistance with internal or external stairs (with or without device)? Needed some help  Functional Cognition: Did the patient need help planning regular tasks such as shopping or remembering to take medications? Needed some help  Home Assistive Devices / Equipment Home Equipment: Shower seat, Bedside commode, Environmental consultant - 4 wheels, Wheelchair - manual  Prior Device Use: Indicate devices/aids used by the patient prior to current illness, exacerbation or injury? 3 prong cane  Current Functional  Level Cognition  Overall Cognitive Status: Impaired/Different from baseline Current Attention Level: Focused Orientation Level: Oriented to person, Oriented to place Following Commands: Follows one step commands inconsistently, Follows one step commands with increased time Safety/Judgement: Decreased awareness of safety, Decreased awareness of deficits General Comments: pt responding verbally and moving very slowly    Extremity Assessment (includes Sensation/Coordination)  Upper Extremity Assessment: Generalized weakness  Lower Extremity Assessment: Generalized weakness RLE Deficits / Details: pain with mobility RLE Coordination: decreased gross motor    ADLs  Overall ADL's : Needs assistance/impaired Eating/Feeding: Maximal assistance, Sitting Eating/Feeding Details (indicate cue type and reason): to drink from straw Grooming: Maximal assistance, Sitting, Wash/dry face Grooming Details (indicate cue type and reason): brought washcloth to face only Upper Body Bathing: Minimal assistance, Sitting Lower Body Bathing: Moderate assistance, Sitting/lateral leans, Sit to/from stand, Cueing for safety, Cueing for sequencing Upper Body Dressing : Maximal assistance, Sitting Upper Body Dressing Details (indicate cue type and reason): max to total for back brace and front opening gown Lower Body Dressing: Moderate assistance, Sitting/lateral leans, Sit to/from stand, Cueing for safety, Cueing for sequencing Toilet Transfer: +2 for physical assistance, Maximal assistance, Stand-pivot, BSC Toileting- Clothing Manipulation and Hygiene: Moderate assistance, Sitting/lateral lean, Sit to/from stand, Cueing for safety, Cueing for sequencing Functional mobility during ADLs: Minimal assistance, Moderate assistance, +2 for physical assistance, Rolling walker General ADL Comments: minA for UB ADL due to soreness s/p sx and modA for LB ADL.    Mobility  Overal bed mobility: Needs Assistance Bed Mobility:  Rolling, Sidelying to Sit Rolling: +2 for physical assistance, Max assist Sidelying to sit: +2 for safety/equipment, Mod assist Sit to sidelying: Max assist, +2 for physical assistance General bed mobility comments: step by step cues for log roll technique, assist for all aspects    Transfers  Overall transfer level: Needs assistance Equipment used: Rolling walker (2 wheeled) Transfers: Sit to/from Stand, Stand Pivot Transfers Sit to Stand: +2 physical assistance, Mod assist, Max assist Stand pivot transfers: +2 physical assistance, Max assist General transfer comment: cues for hand placement and to bring feet back, assist with gait and under hips to stand, +2 max from bed upon second attempt, +2 mod from [facility]    Ambulation / Gait / Stairs / Wheelchair Mobility  Ambulation/Gait Ambulation/Gait assistance: Min assist, +2 physical assistance, +2 safety/equipment Gait Distance (Feet): 25 Feet Assistive device: Rolling walker (2 wheeled)  Gait Pattern/deviations: Step-through pattern, Wide base of support, Trunk flexed, Decreased stride length General Gait Details: mulitmodal cues for upright posture and assistance to steady and guide RW; verbal cues for forward gaze. Gait velocity: decreased Gait velocity interpretation: <1.31 ft/sec, indicative of household ambulator    Posture / Balance Dynamic Sitting Balance Sitting balance - Comments: posterior lean Balance Overall balance assessment: Needs assistance Sitting-balance support: Feet supported, Bilateral upper extremity supported Sitting balance-Leahy Scale: Poor Sitting balance - Comments: posterior lean Postural control: Posterior lean Standing balance support: Bilateral upper extremity supported, During functional activity Standing balance-Leahy Scale: Poor Standing balance comment: flexed posture, requires B UE support on walker and therapist assist    Special needs/care consideration BiPAP/CPAP n/a CPM n/a Continuous Drip IV   N/a Dialysis n/a Life Vest  N/a Oxygen  N/a Special Bed  N/a Trach Size  N/a Wound Vac n/a Skin surgical dressing with moderate serosanguinous drainage Bowel mgmt:  Continent LBM 8/4 complaints of constipation today Bladder mgmt: external catheter; had some retention requiring in and out caths postoperatively Diabetic mgmt: yes pta Behavioral consideration  Confusion new since anesthesia; daughter states typical after surgery for pt Chemo/radiation  N/a   Previous Home Environment  Living Arrangements: (daughter, [name])  Lives With: Daughter Available Help at Discharge: Family, Available 24 hours/day(daughter conuslt one day per week and has caregiver there 9 ) Type of Home: House Home Layout: (inlaw suite in full basement) Alternate Level Stairs-Number of Steps: full flight Home Access: Ramped entrance Bathroom Shower/Tub: Multimedia programmer: Handicapped height Bathroom Accessibility: Yes How Accessible: Accessible via walker Home Care Services: (private aide one day per week) Additional Comments: pt reports she uses a cane occasionally for mobility  Discharge Living Setting Plans for Discharge Living Setting: Lives with (comment)(daughter, [name]) Type of Home at Discharge: House Discharge Home Layout: Multi-level(lives in inlaw suite in full basement. COmes up daily to be ) Alternate Level Stairs-Number of Steps: flight Discharge Home Access: [place] entrance Discharge Bathroom Shower/Tub: Walk-in shower Discharge Bathroom Toilet: Handicapped height Discharge Bathroom Accessibility: Yes How Accessible: Accessible via walker Does the patient have any problems obtaining your medications?: No  Social/Family/Support Systems Patient Roles: Parent Contact Information: daughter, [name] Anticipated Caregiver: daughter, [name] will also be her designated visitor Anticipated Ambulance person Information: see above Ability/Limitations of Caregiver: consult once  weekly otherwise in the home Caregiver Availability: 24/7 Discharge Plan Discussed with Primary Caregiver: Yes Is Caregiver In Agreement with Plan?: Yes Does Caregiver/Family have Issues with Lodging/Transportation while Pt is in Rehab?: No  Goals/Additional Needs Patient/Family Goal for Rehab: supervision PT, supervision to min assist OT Expected length of stay: ELOS 10 - 12 days Pt/Family Agrees to Admission and willing to participate: Yes Program Orientation Provided & Reviewed with Pt/Caregiver Including Roles  & Responsibilities: Yes  Decrease burden of Care through IP rehab admission: n/a  Possible need for SNF placement upon discharge: not anticipated  Patient Condition: I have reviewed medical records from [facility] , spoken with CM, and patient and daughter. I met with patient at the bedside for inpatient rehabilitation assessment.  Patient will benefit from ongoing PT and OT, can actively participate in 3 hours of therapy a day 5 days of the week, and can make measurable gains during the admission.  Patient will also benefit from the coordinated team approach during an Inpatient Acute Rehabilitation admission.  The patient will receive intensive therapy as well as Rehabilitation physician, nursing, social worker, and care management interventions.  Due  to bladder management, bowel management, safety, skin/wound care, disease management, medication administration, pain management and patient education the patient requires 24 hour a day rehabilitation nursing.  The patient is currently mod to max assist with mobility and basic ADLs.  Discharge setting and therapy post discharge at home with home health is anticipated.  Patient has agreed to participate in the Acute Inpatient Rehabilitation Program and will admit today.  Preadmission Screen Completed By:  Cleatrice Burke RN MSN 06/10/2019 1:12 PM ______________________________________________________________________    Discussed status with Dr. Naaman Plummer  on  06/10/2019 at  29 and received approval for admission today.  Admission Coordinator:  Cleatrice Burke, RN, time  5217 Date  06/10/2019   Assessment/Plan: Diagnosis: lumbar spondylosis/stenosis s/p Lumbar lami/fusion 1. Does the need for close, 24 hr/day Medical supervision in concert with the patient's rehab needs make it unreasonable for this patient to be served in a less intensive setting? Yes 2. Co-Morbidities requiring supervision/potential complications: dm, ckd, anemia, htn 3. Due to bladder management, bowel management, safety, skin/wound care, disease management, medication administration, pain management and patient education, does the patient require 24 hr/day rehab nursing? Yes 4. Does the patient require coordinated care of a physician, rehab nurse, PT (1-2 hrs/day, 5 days/week) and OT (1-2 hrs/day, 5 days/week) to address physical and functional deficits in the context of the above medical diagnosis(es)? Yes Addressing deficits in the following areas: balance, endurance, locomotion, strength, transferring, bowel/bladder control, bathing, dressing, feeding, grooming, toileting and psychosocial support 5. Can the patient actively participate in an intensive therapy program of at least 3 hrs of therapy 5 days a week? Yes 6. The potential for patient to make measurable gains while on inpatient rehab is excellent 7. Anticipated functional outcomes upon discharge from inpatients are: supervision PT, supervision and min assist OT, n/a SLP 8. Estimated rehab length of stay to reach the above functional goals is: 10-12 days 9. Anticipated D/C setting: Home 10. Anticipated post D/C treatments: [place] therapy 11. Overall Rehab/Functional Prognosis: excellent  MD Signature: [name], MD, [facility] 06/10/2019

## 2019-06-11 ENCOUNTER — Inpatient Hospital Stay (HOSPITAL_COMMUNITY): Payer: Medicare Other | Admitting: Physical Therapy

## 2019-06-11 ENCOUNTER — Inpatient Hospital Stay (HOSPITAL_COMMUNITY): Payer: Medicare Other | Admitting: Occupational Therapy

## 2019-06-11 ENCOUNTER — Encounter (HOSPITAL_COMMUNITY): Payer: Self-pay | Admitting: *Deleted

## 2019-06-11 LAB — CBC WITH DIFFERENTIAL/PLATELET
Abs Immature Granulocytes: 0.03 10*3/uL (ref 0.00–0.07)
Basophils Absolute: 0 10*3/uL (ref 0.0–0.1)
Basophils Relative: 0 %
Eosinophils Absolute: 0.2 10*3/uL (ref 0.0–0.5)
Eosinophils Relative: 2 %
HCT: 24.4 % — ABNORMAL LOW (ref 36.0–46.0)
Hemoglobin: 8.1 g/dL — ABNORMAL LOW (ref 12.0–15.0)
Immature Granulocytes: 0 %
Lymphocytes Relative: 17 %
Lymphs Abs: 1.3 10*3/uL (ref 0.7–4.0)
MCH: 29.8 pg (ref 26.0–34.0)
MCHC: 33.2 g/dL (ref 30.0–36.0)
MCV: 89.7 fL (ref 80.0–100.0)
Monocytes Absolute: 1 10*3/uL (ref 0.1–1.0)
Monocytes Relative: 13 %
Neutro Abs: 5 10*3/uL (ref 1.7–7.7)
Neutrophils Relative %: 68 %
Platelets: 183 10*3/uL (ref 150–400)
RBC: 2.72 MIL/uL — ABNORMAL LOW (ref 3.87–5.11)
RDW: 14.2 % (ref 11.5–15.5)
WBC: 7.5 10*3/uL (ref 4.0–10.5)
nRBC: 0 % (ref 0.0–0.2)

## 2019-06-11 LAB — COMPREHENSIVE METABOLIC PANEL
ALT: 10 U/L (ref 0–44)
AST: 17 U/L (ref 15–41)
Albumin: 2.9 g/dL — ABNORMAL LOW (ref 3.5–5.0)
Alkaline Phosphatase: 57 U/L (ref 38–126)
Anion gap: 13 (ref 5–15)
BUN: 37 mg/dL — ABNORMAL HIGH (ref 8–23)
CO2: 23 mmol/L (ref 22–32)
Calcium: 8.6 mg/dL — ABNORMAL LOW (ref 8.9–10.3)
Chloride: 99 mmol/L (ref 98–111)
Creatinine, Ser: 1.46 mg/dL — ABNORMAL HIGH (ref 0.44–1.00)
GFR calc Af Amer: 38 mL/min — ABNORMAL LOW (ref >60)
GFR calc non Af Amer: 33 mL/min — ABNORMAL LOW (ref >60)
Glucose, Bld: 124 mg/dL — ABNORMAL HIGH (ref 70–99)
Potassium: 3.9 mmol/L (ref 3.5–5.1)
Sodium: 135 mmol/L (ref 135–145)
Total Bilirubin: 0.6 mg/dL (ref 0.3–1.2)
Total Protein: 6.5 g/dL (ref 6.5–8.1)

## 2019-06-11 LAB — GLUCOSE, CAPILLARY
Glucose-Capillary: 135 mg/dL — ABNORMAL HIGH (ref 70–99)
Glucose-Capillary: 124 mg/dL — ABNORMAL HIGH (ref 70–99)
Glucose-Capillary: 105 mg/dL — ABNORMAL HIGH (ref 70–99)
Glucose-Capillary: 285 mg/dL — ABNORMAL HIGH (ref 70–99)
Glucose-Capillary: 158 mg/dL — ABNORMAL HIGH (ref 70–99)
Glucose-Capillary: 166 mg/dL — ABNORMAL HIGH (ref 70–99)

## 2019-06-11 MED ORDER — INSULIN ASPART 100 UNIT/ML ~~LOC~~ SOLN
0.0000 [IU] | Freq: Three times a day (TID) | SUBCUTANEOUS | Status: DC
  Administered 2019-06-11: 19:00:00 4 [IU] via SUBCUTANEOUS
  Administered 2019-06-11: 13:00:00 11 [IU] via SUBCUTANEOUS
  Administered 2019-06-12: 10:00:00 7 [IU] via SUBCUTANEOUS
  Administered 2019-06-12: 14:00:00 11 [IU] via SUBCUTANEOUS
  Administered 2019-06-12 – 2019-06-14 (×6): 3 [IU] via SUBCUTANEOUS
  Administered 2019-06-15: 18:00:00 4 [IU] via SUBCUTANEOUS
  Administered 2019-06-15: 3 [IU] via SUBCUTANEOUS
  Administered 2019-06-15: 13:00:00 4 [IU] via SUBCUTANEOUS
  Administered 2019-06-16: 3 [IU] via SUBCUTANEOUS
  Administered 2019-06-16: 12:00:00 4 [IU] via SUBCUTANEOUS
  Administered 2019-06-16 – 2019-06-17 (×2): 3 [IU] via SUBCUTANEOUS
  Administered 2019-06-17: 18:00:00 4 [IU] via SUBCUTANEOUS
  Administered 2019-06-17: 7 [IU] via SUBCUTANEOUS
  Administered 2019-06-18: 4 [IU] via SUBCUTANEOUS
  Administered 2019-06-18 (×2): 3 [IU] via SUBCUTANEOUS
  Administered 2019-06-19: 13:00:00 4 [IU] via SUBCUTANEOUS
  Administered 2019-06-19: 09:00:00 3 [IU] via SUBCUTANEOUS
  Administered 2019-06-19 – 2019-06-21 (×4): 4 [IU] via SUBCUTANEOUS
  Administered 2019-06-21 – 2019-06-22 (×3): 3 [IU] via SUBCUTANEOUS
  Administered 2019-06-22: 4 [IU] via SUBCUTANEOUS
  Administered 2019-06-22 – 2019-06-23 (×2): 3 [IU] via SUBCUTANEOUS
  Administered 2019-06-23: 7 [IU] via SUBCUTANEOUS
  Administered 2019-06-23 – 2019-06-24 (×3): 3 [IU] via SUBCUTANEOUS
  Administered 2019-06-24: 7 [IU] via SUBCUTANEOUS
  Administered 2019-06-25 (×2): 4 [IU] via SUBCUTANEOUS
  Administered 2019-06-26: 13:00:00 7 [IU] via SUBCUTANEOUS
  Administered 2019-06-26 – 2019-06-27 (×2): 3 [IU] via SUBCUTANEOUS
  Administered 2019-06-27: 4 [IU] via SUBCUTANEOUS
  Administered 2019-06-27 – 2019-06-28 (×2): 3 [IU] via SUBCUTANEOUS
  Administered 2019-06-28: 12:00:00 7 [IU] via SUBCUTANEOUS

## 2019-06-11 NOTE — Evaluation (Signed)
Occupational Therapy Assessment and Plan  Patient Details  Name: [name] Mathews MRN: [id number] Date of Birth: [birth year]  OT Diagnosis: acute pain, lumbago (low back pain) and muscle weakness (generalized) Rehab Potential: Rehab Potential (ACUTE ONLY): Good ELOS: 21-24 days   Today's Date: 06/11/2019 OT Individual Time: 4628-6381 OT Individual Time Calculation (min): 53 min     Problem List:  Patient Active Problem List   Diagnosis Date Noted  . Lumbar radiculopathy 06/10/2019  . Lumbar adjacent segment disease with spondylolisthesis 06/05/2019  . Hypersomnia with long sleep time, idiopathic 01/17/2018  . Snoring 01/17/2018  . Sleep apnea with hypersomnolence 01/17/2018  . Excessive daytime sleepiness 01/17/2018  . Normochromic normocytic anemia 05/22/2017  . Radiculopathy 07/11/2012  . HTN (hypertension) 07/08/2012  . Diabetes mellitus ([place]) 07/08/2012  . Encephalopathy acute 07/08/2012  . Tachycardia 07/08/2012  . UTI (lower urinary tract infection) 07/08/2012    Past Medical History:  Past Medical History:  Diagnosis Date  . Anemia    hx of  . Arthritis   . Breast lump    hx of  . Cataract    left eye  . Chronic back pain   . Diabetes mellitus   . Hyperlipidemia   . Hypertension    sees Dr. Jarrett Ables, [facility] internal medicine  . Normochromic normocytic anemia 05/22/2017   Hb 10 MCV 85.6 No monoclonal spike on SPEP  Creatinine 1.3-1.6  . Peripheral vascular disease [facility])    Past Surgical History:  Past Surgical History:  Procedure Laterality Date  . APPENDECTOMY    . BACK SURGERY    . BREAST CYST EXCISION Left   . BREAST SURGERY     cyst removed left side  . BUNIONECTOMY    . CARDIOVASCULAR STRESS TEST    . COLONOSCOPY    . DILATION AND CURETTAGE OF UTERUS    . TONSILLECTOMY      Assessment & Plan Clinical Impression: Patient is a 85 y.o. right-handed female history of diabetes mellitus, memory deficits, CKD stage II with creatinine  baseline 1.25-1.57, hyperlipidemia hypertension, normocytic anemia as well as chronic back pain as well as history of L3-4 and 4-5 decompression and fusion years ago. Per chart review independent with assistive device prior to admission. She lives with his daughter. Two-level level home with ramped entrance with bed and bath on main level. Patient had been receiving home health therapies for chronic back pain. Presented 06/05/2019 with recurrent back pain radiating to the lower extremities. Lumbar MRI and imaging demonstrated stenosis spondylolisthesis with radiculopathy at L2-3. Underwent bilateral L2-3 and L1-2 laminotomy foraminotomies to decompress the bilateral L2 and L3 nerve roots as well as posterior lumbar interbody fusion 06/05/2019 per Dr. Arnoldo Morale. Back brace out of bed applied in sitting position. Hospital course pain management. Initial bouts of confusion monitoring use of narcotics. Patient did develop some serosanguineous drainage from wound site placed on Keflex 06/07/2019. Acute blood loss anemia 8.4 and monitored. Therapy evaluations completed and patient was admitted for a comprehensive rehab program.   Patient transferred to CIR on 06/10/2019 .    Patient currently requires total with basic self-care skills secondary to muscle weakness, impaired timing and sequencing and unbalanced muscle activation, decreased attention, decreased awareness, decreased safety awareness, decreased memory and delayed processing and decreased standing balance and decreased balance strategies.  Prior to hospitalization, patient could complete ADLs with independent .  Patient will benefit from skilled intervention to decrease level of assist with basic self-care skills prior to discharge home with  care partner.  Anticipate patient will require minimal physical assistance and follow up home health.  OT - End of Session Activity Tolerance: Tolerates 30+ min activity with multiple rests Endurance Deficit:  Yes Endurance Deficit Description: very lethargic, limited participation in any functional task OT Assessment Rehab Potential (ACUTE ONLY): Good OT Patient demonstrates impairments in the following area(s): Balance;Cognition;Endurance;Motor;Pain;Safety OT Basic ADL's Functional Problem(s): Grooming;Bathing;Dressing;Toileting OT Transfers Functional Problem(s): Toilet;Tub/Shower OT Additional Impairment(s): None OT Plan OT Intensity: Minimum of 1-2 x/day, 45 to 90 minutes OT Frequency: Total of 15 hours over 7 days of combined therapies OT Duration/Estimated Length of Stay: 21-24 days OT Treatment/Interventions: Balance/vestibular training;Cognitive remediation/compensation;Community reintegration;Discharge planning;Disease mangement/prevention;DME/adaptive equipment instruction;Functional mobility training;Neuromuscular re-education;Pain management;Patient/family education;Psychosocial support;Self Care/advanced ADL retraining;Skin care/wound managment;Splinting/orthotics;Therapeutic Activities;Therapeutic Exercise;UE/LE Strength taining/ROM OT Basic Self-Care Anticipated Outcome(s): Min assist OT Toileting Anticipated Outcome(s): Min assist OT Bathroom Transfers Anticipated Outcome(s): Min assist OT Recommendation Patient destination: Home Follow Up Recommendations: Home health OT;24 hour supervision/assistance Equipment Recommended: To be determined   Skilled Therapeutic Intervention OT eval completed with discussion of rehab process, OT purpose, POC, ELOS, and goals.  Limited ADL assessment due to lethargy and requiring increased time for processing and initiation of tasks.  Pt declined bathing/dressing due to fatigue and already dressing during PT session.  Pt requesting to return to bed.  Engaged in sit > stand and stand pivot transfer with +2 for sit > stand and then min-mod assist during stand pivot with +2 for safety.  Pt required increased time for initiation during mobility.  Pt  unable to doff shoes despite stating that she could; pt with no initiation and no awareness of inability to complete safely.  Pt unable to report any back precautions, provided education on precautions.  Pt returned to supine and left in bed with all needs in reach.  OT Evaluation Precautions/Restrictions  Precautions Precautions: Fall;Back Required Braces or Orthoses: Spinal Brace Spinal Brace: Lumbar corset;Applied in sitting position Restrictions Weight Bearing Restrictions: No Pain Pain Assessment Pain Scale: 0-10 Pain Score: 7  Pain Type: Surgical pain Pain Location: Back Pain Descriptors / Indicators: Aching Pain Intervention(s): RN made aware;Repositioned Home Living/Prior Functioning Home Living Available Help at Discharge: Family, Available 24 hours/day Type of Home: House Home Access: Stairs to enter Technical brewer of Steps: 4 Entrance Stairs-Rails: Left Home Layout: Multi-level, Able to live on main level with bedroom/bathroom Bathroom Shower/Tub: Holiday representative Accessibility: Yes Additional Comments: pt reports she uses a cane occasionally for mobility  Lives With: Daughter IADL History Homemaking Responsibilities: No Prior Function Level of Independence: Independent with basic ADLs, Independent with transfers  Able to Take Stairs?: Yes Driving: No Comments: Pt reports independence with ADL and assist for IADLs. ADL   Vision Baseline Vision/History: Wears glasses Wears Glasses: At all times Patient Visual Report: No change from baseline Vision Assessment?: No apparent visual deficits Cognition Overall Cognitive Status: Impaired/Different from baseline Arousal/Alertness: Lethargic Orientation Level: Person;Place;Situation Person: Oriented Place: Oriented Situation: Oriented Year: (No response, even when given the cue "2 thousand what") Month: August Day of Week: ("today is.Marland Kitchen..") Memory: Impaired Immediate Memory Recall:  Sock;Blue;Bed Memory Recall Sock: With Cue Memory Recall Blue: Not able to recall Memory Recall Bed: (not able to recall) Awareness: Impaired Awareness Impairment: Emergent impairment Safety/Judgment: Impaired Sensation Sensation Light Touch: Appears Intact Proprioception: Impaired by gross assessment Coordination Gross Motor Movements are Fluid and Coordinated: No Fine Motor Movements are Fluid and Coordinated: No Coordination and Movement Description: extremely slow Finger Nose Finger Test: unable to complete  due to lethargy Motor  Motor Motor - Skilled Clinical Observations: generalized weakness Mobility  Bed Mobility Bed Mobility: Sit to Supine;Rolling Left;Rolling Right Rolling Right: Maximal Assistance - Patient 25-49% Rolling Left: Maximal Assistance - Patient 25-49% Sit to Supine: Maximal Assistance - Patient 25-49% Transfers Sit to Stand: 2 Helpers  Extremity/Trunk Assessment RUE Assessment RUE Assessment: Exceptions to [facility] Active Range of Motion (AROM) Comments: limited shoulder range over head General Strength Comments: grossly 4/5.  Very limited due to lethargy LUE Assessment LUE Assessment: Exceptions to [facility] Active Range of Motion (AROM) Comments: limited shoulder range over head General Strength Comments: grossly 4/5, very limited due to lethargy     Refer to Care Plan for Long Term Goals  Recommendations for other services: None    Discharge Criteria: Patient will be discharged from OT if patient refuses treatment 3 consecutive times without medical reason, if treatment goals not met, if there is a change in medical status, if patient makes no progress towards goals or if patient is discharged from hospital.  The above assessment, treatment plan, treatment alternatives and goals were discussed and mutually agreed upon: by patient  Simonne Come 06/11/2019, 2:44 PM

## 2019-06-11 NOTE — Progress Notes (Signed)
At the beginning of the shift, patient was reported to be lethargic with a low grade temperature, increased respirations, blood pressure & pulse & very delayed responses. A little later in the shift, she seemed to become more alert & responsive. Her responses are very delayed, unclear what her baseline was before her surgery. She also has delayed motor control. When given the medicine cup, she was not able to bring it to her mouth. I switched hands & it was the same. It seems like she has a processing difficulty. She has memory deficits & has a hard time finding the words. She could not recall the season or anywhere near the date, but she did know that she was at [facility]. She couldn't remember or couldn't find the right words to recall why she was here. Plantar flexion & extension was adequate, but her grips are very weak & she could not bring her hands up to her mouth to take her medicine. She just held the cup, weakly, as if she was going to drop it. No c/o pain at all. Her back has a honeycomb dressing that is intact, no drainage noted, She is on keflex due to drainage from her incision. Medication was given as ordered. There was a little delay in swallowing, seemed forced. May need supervision with meals due to needing a lot of cueing to complete a task such as taking her pills, which she never put the pill or cup to her mouth. This nurse had to pour the meds into her mouth & hold the cup for her. No acute distress noted. Her vitals this morning are WNL. She is responding well. Movement in bed to turn takes maximum assistance right now.No acute distress noted at this time

## 2019-06-11 NOTE — Progress Notes (Signed)
Patient information reviewed and entered into [facility] System by Becky Raha Tennison, PPS coordinator. Information including medical coding, function ability, and quality indicators will be reviewed and updated through discharge.   

## 2019-06-11 NOTE — Evaluation (Signed)
Physical Therapy Assessment and Plan  Patient Details  Name: [name] Mathews MRN: [id number] Date of Birth: 1934-04-17  PT Diagnosis: Cognitive deficits, Difficulty walking, Impaired cognition, Low back pain and Muscle weakness Rehab Potential: Good ELOS: 18-21 days   Today's Date: 06/11/2019 PT Individual Time: 0900-1015 and 1100-1140  PT Individual Time Calculation (min): 75 min and 40 min   Problem List:  Patient Active Problem List   Diagnosis Date Noted  . Lumbar radiculopathy 06/10/2019  . Lumbar adjacent segment disease with spondylolisthesis 06/05/2019  . Hypersomnia with long sleep time, idiopathic 01/17/2018  . Snoring 01/17/2018  . Sleep apnea with hypersomnolence 01/17/2018  . Excessive daytime sleepiness 01/17/2018  . Normochromic normocytic anemia 05/22/2017  . Radiculopathy 07/11/2012  . HTN (hypertension) 07/08/2012  . Diabetes mellitus ([place]) 07/08/2012  . Encephalopathy acute 07/08/2012  . Tachycardia 07/08/2012  . UTI (lower urinary tract infection) 07/08/2012    Past Medical History:  Past Medical History:  Diagnosis Date  . Anemia    hx of  . Arthritis   . Breast lump    hx of  . Cataract    left eye  . Chronic back pain   . Diabetes mellitus   . Hyperlipidemia   . Hypertension    sees Dr. Jarrett Ables, [facility] internal medicine  . Normochromic normocytic anemia 05/22/2017   Hb 10 MCV 85.6 No monoclonal spike on SPEP  Creatinine 1.3-1.6  . Peripheral vascular disease [facility])    Past Surgical History:  Past Surgical History:  Procedure Laterality Date  . APPENDECTOMY    . BACK SURGERY    . BREAST CYST EXCISION Left   . BREAST SURGERY     cyst removed left side  . BUNIONECTOMY    . CARDIOVASCULAR STRESS TEST    . COLONOSCOPY    . DILATION AND CURETTAGE OF UTERUS    . TONSILLECTOMY      Assessment & Plan Clinical Impression: Patient is a 85 y.o. year old female with recent admission to the hospital and Underwent bilateral L2-3 and  L1-2 laminotomy foraminotomies to decompress the bilateral L2 and L3 nerve roots as well as posterior lumbar interbody fusion 06/05/2019 per Dr. Arnoldo Morale..  Patient transferred to CIR on 06/10/2019 .   Patient currently requires total with mobility secondary to muscle weakness, decreased initiation and delayed processing and decreased sitting balance, decreased standing balance, decreased postural control and decreased balance strategies.  Prior to hospitalization, patient was modified independent  with mobility and lived with Daughter in a House home.  Home access is 4Stairs to enter.  Patient will benefit from skilled PT intervention to maximize safe functional mobility, minimize fall risk and decrease caregiver burden for planned discharge home with 24 hour assist.  Anticipate patient will benefit from follow up [facility] at discharge.  PT - End of Session Activity Tolerance: Tolerates 30+ min activity with multiple rests Endurance Deficit: Yes PT Assessment Rehab Potential (ACUTE/IP ONLY): Good PT Patient demonstrates impairments in the following area(s): Balance;Endurance;Motor;Pain;Safety PT Transfers Functional Problem(s): Bed Mobility;Bed to Chair;Car;Furniture PT Locomotion Functional Problem(s): Stairs;Wheelchair Mobility;Ambulation PT Plan PT Intensity: Minimum of 1-2 x/day ,45 to 90 minutes PT Frequency: 5 out of 7 days PT Duration Estimated Length of Stay: 18-21 days PT Treatment/Interventions: Ambulation/gait training;Discharge planning;Functional mobility training;Therapeutic Activities;Balance/vestibular training;Neuromuscular re-education;Therapeutic Exercise;Wheelchair propulsion/positioning;Cognitive remediation/compensation;DME/adaptive equipment instruction;Pain management;Splinting/orthotics;UE/LE Strength taining/ROM;UE/LE Clinical cytogeneticist reintegration PT Transfers Anticipated Outcome(s): min A PT Locomotion Anticipated  Outcome(s): min A PT Recommendation Follow Up Recommendations: Home health PT Patient  destination: Home Equipment Recommended: To be determined  Skilled Therapeutic Intervention Pt participated in skilled PT eval and pt/daughter educated on PT POC and goals. Pt lethargic with slow initiation and delayed processing throughout session.  Pt +2 max A for sit <> stand with RW and SPT with RW.  Pt performs gait with +2 assist for safety x 15'.  Transfer with stedy to [facility] over toilet with +2 max A for sit <> Stand.  Pt left in w/c with all needs at hand, family present.  Session 2: Stair negotiation x 2 3'' steps with mod A to ascend stairs, max A to descend stairs safely.  Simulated car transfer to sedan height car with squat pivot transfer, max A for transfer and to get LEs into car. Pt continues with slow response time, decreased initiation, max cuing and increased time to follow commands.  kinetron 3 x 1 minute for LE strength and endurance. Pt left in w/c with alarm set, needs at hand, family present.  PT Evaluation Precautions/Restrictions Precautions Precautions: Fall;Back Required Braces or Orthoses: Spinal Brace Spinal Brace: Lumbar corset;Applied in sitting position Restrictions Weight Bearing Restrictions: No Pain Pain Assessment Faces Pain Scale: No hurt Home Living/Prior Functioning Home Living Available Help at Discharge: Family;Available 24 hours/day Type of Home: House Home Access: Stairs to enter [employer] of Steps: 4 Entrance Stairs-Rails: Left Home Layout: Multi-level;Able to live on main level with bedroom/bathroom Additional Comments: pt reports she uses a cane occasionally for mobility  Lives With: Daughter Prior Function Level of Independence: Independent with basic ADLs;Independent with transfers  Able to Take Stairs?: Yes Comments: Pt reports independence with ADL and assist for IADLs.   Cognition Overall Cognitive Status: Impaired/Different from  baseline Arousal/Alertness: Lethargic Orientation Level: Oriented to person;Oriented to place;Oriented to situation Memory: Impaired Awareness: Impaired Awareness Impairment: Emergent impairment Safety/Judgment: Impaired Sensation Sensation Light Touch: Appears Intact Proprioception: Impaired by gross assessment Coordination Gross Motor Movements are Fluid and Coordinated: No Fine Motor Movements are Fluid and Coordinated: No Motor  Motor Motor - Skilled Clinical Observations: generalized weakness  Mobility Bed Mobility Bed Mobility: Rolling Right;Rolling Left;Supine to Sit Rolling Right: Maximal Assistance - Patient 25-49% Rolling Left: Maximal Assistance - Patient 25-49% Supine to Sit: Maximal Assistance - Patient - Patient 25-49% Transfers Transfers: Sit to Stand;Stand Pivot Transfers Sit to Stand: Maximal Assistance - Patient 25-49% Stand Pivot Transfers: 2 Press photographer (Assistive device): Manufacturing systems engineer Ambulation: Yes Gait Assistance: 2 Holiday representative (Feet): 15 Feet Assistive device: Rolling walker  Trunk/Postural Assessment  Cervical Assessment Cervical Assessment: (fwd head) Thoracic Assessment Thoracic Assessment: (kyphotic) Lumbar Assessment Lumbar Assessment: (LSO) Postural Control Postural Control: Deficits on evaluation Righting Reactions: delayed and inadequate  Balance Static Sitting Balance Static Sitting - Comment/# of Minutes: posterior lean, min a Dynamic Sitting Balance Sitting balance - Comments: posterior lean, min A Static Standing Balance Static Standing - Comment/# of Minutes: mod A due to posterior lean Dynamic Standing Balance Dynamic Standing - Comments: max A Extremity Assessment      RLE Assessment General Strength Comments: grossly 3/5 LLE Assessment General Strength Comments: grossly 3/5    Refer to Care Plan for Long Term Goals  Recommendations for other services: None   Discharge  Criteria: Patient will be discharged from PT if patient refuses treatment 3 consecutive times without medical reason, if treatment goals not met, if there is a change in medical status, if patient makes no progress towards goals or if patient is discharged from hospital.  The above assessment, treatment plan, treatment alternatives and goals were discussed and mutually agreed upon: by patient and by family  Josejulian Tarango 06/11/2019, 10:16 AM

## 2019-06-11 NOTE — Progress Notes (Signed)
Social Work Assessment and Plan   Patient Details  Name: [name] Mathews MRN: [id number] Date of Birth: 02-04-1934  Today's Date: 06/11/2019  Problem List:  Patient Active Problem List   Diagnosis Date Noted  . Lumbar radiculopathy 06/10/2019  . Lumbar adjacent segment disease with spondylolisthesis 06/05/2019  . Hypersomnia with long sleep time, idiopathic 01/17/2018  . Snoring 01/17/2018  . Sleep apnea with hypersomnolence 01/17/2018  . Excessive daytime sleepiness 01/17/2018  . Normochromic normocytic anemia 05/22/2017  . Radiculopathy 07/11/2012  . HTN (hypertension) 07/08/2012  . Diabetes mellitus ([place]) 07/08/2012  . Encephalopathy acute 07/08/2012  . Tachycardia 07/08/2012  . UTI (lower urinary tract infection) 07/08/2012   Past Medical History:  Past Medical History:  Diagnosis Date  . Anemia    hx of  . Arthritis   . Breast lump    hx of  . Cataract    left eye  . Chronic back pain   . Diabetes mellitus   . Hyperlipidemia   . Hypertension    sees Dr. Jarrett Ables, [facility] internal medicine  . Normochromic normocytic anemia 05/22/2017   Hb 10 MCV 85.6 No monoclonal spike on SPEP  Creatinine 1.3-1.6  . Peripheral vascular disease [facility])    Past Surgical History:  Past Surgical History:  Procedure Laterality Date  . APPENDECTOMY    . BACK SURGERY    . BREAST CYST EXCISION Left   . BREAST SURGERY     cyst removed left side  . BUNIONECTOMY    . CARDIOVASCULAR STRESS TEST    . COLONOSCOPY    . DILATION AND CURETTAGE OF UTERUS    . TONSILLECTOMY     Social History:  reports that she has never smoked. She has never used smokeless tobacco. She reports that she does not drink alcohol or use drugs.  Family / Support Systems Marital Status: Widow/Widower How Long?: years Patient Roles: Parent, Other (Comment)(church member) Children: Louretta Shorten - dtr - [phone] (pt lives with her); Weslee Prestage - dtr - [phone] Other Supports: other  adult children Anticipated Caregiver: daughter, [name] will also be her designated visitor Ability/Limitations of Caregiver: consult once weekly otherwise in the home; pt also has a caregiver 9am-4pm Caregiver Availability: 24/7(Dtr stated that pt will never be alone initially.) Family Dynamics: close, supportive family  Social History Preferred language: English Religion: Baptist Read: Yes Write: Yes Employment Status: Retired Age Retired: 81(pt was unsure of this, but thinks it was 4 years ago) Public relations account executive Issues: none reported Guardian/Conservator: N/A - MD has determined that pt is capable of making her own decisions.   Abuse/Neglect Abuse/Neglect Assessment Can Be Completed: Yes Physical Abuse: Denies Verbal Abuse: Denies Sexual Abuse: Denies Exploitation of patient/patient's resources: Denies Self-Neglect: Denies  Emotional Status Pt's affect, behavior and adjustment status: Pt reports being in "good spirits", but she is flat and slow to respond.  She acknowledges that the medications are "messing with her head" at times, causing her to have memory issues and needing more time to repond to questions. Recent Psychosocial Issues: Pt was on CIR in 2013 and family is very familiar with program and are grateful pt is back for rehab following this recent surgery. Psychiatric History: none reported Substance Abuse History: none reported  Patient / Family Perceptions, Expectations & Goals Pt/Family understanding of illness & functional limitations: Pt/dtr express a good understanding of pt's condition and limitations. Premorbid pt/family roles/activities: Pt likes to be a part of church services, get her nails done,  and spend time with family. Anticipated changes in roles/activities/participation: Pt/dtr hope that pt can resume activities as she is able. Pt/family expectations/goals: Dtr would like for pt to be as independent as possible when she comes home and to be  involved in her usual community activities.  Community [employer] Agencies: None Premorbid Home Care/DME Agencies: Other (Comment)([facility]; Pt has a cane, shower chair, walker, raised commode, and grab bars already.) Transportation available at discharge: family Resource referrals recommended: Neuropsychology  Discharge Planning Living Arrangements: Children(dtr and son-in-law) Support Systems: Children, Other relatives, Friends/neighbors, Social worker community Type of Residence: Private residence Insurance Resources: [employer], Multimedia programmer (specify)([employer] supplement) Financial Resources: Fish farm manager, Family Support Financial Screen Referred: No Living Expenses: Lives with family Money Management: Family Does the patient have any problems obtaining your medications?: No Home Management: Pt's dtr and son-in-law take care of this. Patient/Family Preliminary Plans: Dtr plans to have pt return home after CIR stay and has already moved pt to main floor, so that she will not have to do stairs where her suite is.  She will have someone with her all of the time when she gets home. Social Work Anticipated Follow Up Needs: HH/OP Expected length of stay: 18 to 24 days  Clinical Impression CSW met with pt and later called her dtr, [name], to introduce self and role of CSW, as well as to complete assessment.  Pt has been on CIR in 2013 and had Dr. Naaman Plummer as her doctor.  Dtr is very grateful pt is on CIR again.  She, one of her siblings, or pt's caregiver will be with pt 24/7.  CSW shared that pt will likely need minimal assistance at d/c.  Dtr stated they could do this, but hopes that pt will be more independent soon so that she can feel some normalcy.  Pt has great support and dtr will come to visit and go through therapy regularly.  No current concerns/need/questions at this time, but CSW will continue to follow and assist as needed.  Laniesha Das, Silvestre Mesi 06/11/2019,  6:02 PM

## 2019-06-11 NOTE — Progress Notes (Addendum)
Inpatient Rehabilitation Center Individual Statement of Services  Patient Name:  [name] Mathews  Date:  06/11/2019  Welcome to the [facility].  Our goal is to provide you with an individualized program based on your diagnosis and situation, designed to meet your specific needs.  With this comprehensive rehabilitation program, you will be expected to participate in at least 3 hours of rehabilitation therapies Monday-Friday, with modified therapy programming on the weekends.  Your rehabilitation program will include the following services:  Physical Therapy (PT), Occupational Therapy (OT), 24 hour per day rehabilitation nursing, Neuropsychology, Case Management (Social Worker), Rehabilitation Medicine, Nutrition Services and Pharmacy Services  Weekly team conferences will be held on Wednesdays to discuss your progress.  Your Social Worker will talk with you frequently to get your input and to update you on team discussions.  Team conferences with you and your family in attendance may also be held.  Expected length of stay: 18 to 24 days  Overall anticipated outcome:  Minimal assistance  Depending on your progress and recovery, your program may change. Your Social Worker will coordinate services and will keep you informed of any changes. Your Social Worker's name and contact numbers are listed  below.  The following services may also be recommended but are not provided by the [facility] will be made to provide these services after discharge if needed.  Arrangements include referral to agencies that provide these services.  Your insurance has been verified to be:  Medicare and Airline pilot Your primary doctor is:  Dr. Crist Infante  Pertinent information will be shared with your doctor and your insurance company.  Social Worker:  Alfonse Alpers, LCSW   [phone] or [phone]  Information discussed with and copy given to patient by: Trey Sailors, 06/11/2019, 2:17 PM

## 2019-06-11 NOTE — Progress Notes (Signed)
[facility] PHYSICAL MEDICINE & REHABILITATION PROGRESS NOTE   Subjective/Complaints: Patient  Reports doing "pretty good"- and slept well. There was a note overnight from nursing that pt appeared delayed/slow, lethargic and had low grade temp off 99.8- however temp resolved, and BP improved as well as RR from max of 26.  Pt reports her pain is "pretty well " controlled Nursing reports no drainage from incision that they were aware of. Has honeycomb type dressing.   Objective:   Vas [place] Lower Extremity Venous (dvt)  Result Date: 06/10/2019  Lower Venous Study Indications: Edema.  Comparison Study: no prior Performing Technologist: Jeb LeveringJill Parker RDMS, RVT  Examination Guidelines: A complete evaluation includes B-mode imaging, spectral Doppler, color Doppler, and power Doppler as needed of all accessible portions of each vessel. Bilateral testing is considered an integral part of a complete examination. Limited examinations for reoccurring indications may be performed as noted.  +---------+---------------+---------+-----------+----------+-------+ RIGHT    CompressibilityPhasicitySpontaneityPropertiesSummary +---------+---------------+---------+-----------+----------+-------+ CFV      Full           Yes      Yes                          +---------+---------------+---------+-----------+----------+-------+ SFJ      Full                                                 +---------+---------------+---------+-----------+----------+-------+ FV Prox  Full                                                 +---------+---------------+---------+-----------+----------+-------+ FV Mid   Full                                                 +---------+---------------+---------+-----------+----------+-------+ FV DistalFull                                                 +---------+---------------+---------+-----------+----------+-------+ PFV      Full                                                  +---------+---------------+---------+-----------+----------+-------+ POP      Full           Yes      Yes                          +---------+---------------+---------+-----------+----------+-------+ PTV      Full                                                 +---------+---------------+---------+-----------+----------+-------+ PERO     Full                                                 +---------+---------------+---------+-----------+----------+-------+   +---------+---------------+---------+-----------+----------+-------+  LEFT     CompressibilityPhasicitySpontaneityPropertiesSummary +---------+---------------+---------+-----------+----------+-------+ CFV      Full           Yes      Yes                          +---------+---------------+---------+-----------+----------+-------+ SFJ      Full                                                 +---------+---------------+---------+-----------+----------+-------+ FV Prox  Full                                                 +---------+---------------+---------+-----------+----------+-------+ FV Mid   Full                                                 +---------+---------------+---------+-----------+----------+-------+ FV DistalFull                                                 +---------+---------------+---------+-----------+----------+-------+ PFV      Full                                                 +---------+---------------+---------+-----------+----------+-------+ POP      Full           Yes      Yes                          +---------+---------------+---------+-----------+----------+-------+ PTV      Full                                                 +---------+---------------+---------+-----------+----------+-------+ PERO     Full                                                  +---------+---------------+---------+-----------+----------+-------+     Summary: Right: There is no evidence of deep vein thrombosis in the lower extremity. No cystic structure found in the popliteal fossa. Left: There is no evidence of deep vein thrombosis in the lower extremity. No cystic structure found in the popliteal fossa.  *See table(s) above for measurements and observations.    Preliminary    Recent Labs    06/11/19 0603  WBC 7.5  HGB 8.1*  HCT 24.4*  PLT 183   Recent Labs    06/11/19 0603  NA 135  K 3.9  CL 99  CO2 23  GLUCOSE 124*  BUN 37*  CREATININE 1.46*  CALCIUM 8.6*    Intake/Output Summary (Last 24 hours) at 06/11/2019 1633 Last data filed at 06/11/2019 1429 Gross per 24 hour  Intake 360 ml  Output -  Net 360 ml     Physical Exam: Vital Signs Blood pressure (!) 156/74, pulse 76, temperature 97.9 F (36.6 C), temperature source Oral, resp. rate 17, height 5' 9.5" (1.765 m), weight 95.9 kg, SpO2 96 %.  Constitutional: pt awake, alert, but slightly lethargic, lying in bed, waiting for assistance to get fed, NAD, supine HENT:  Head:Normocephalicand atraumatic. Wearing eyeglasses  Eyes: EOMIs intact grossly Cardiovascular:RRR, no M/R/G Respiratory:Effort normal. Norespiratory distress. She hasno wheezes, rales or rhomchi.  AO:ZHYQGI:soft, NT, slightly distended vs protuberant, hypoactive BS Musculoskeletal:  not wearing brace, since lying down- down in bed, so needed nurse to assist up in bed Neurological: She is alert.  Skin: honeycomb see through dressing on lumbar incision- no drainage noted- mild/trace erythema and trace edema of the local area Psychiatric:  Flat but overall pleasant- mild delay in answering questions    Assessment/Plan: 1. Functional deficits secondary to L1-L3 laninotomy/PLIF which require 3+ hours per day of interdisciplinary therapy in a comprehensive inpatient rehab setting.  Physiatrist is providing close team  supervision and 24 hour management of active medical problems listed below.  Physiatrist and rehab team continue to assess barriers to discharge/monitor patient progress toward functional and medical goals  Care Tool:  Bathing  Bathing activity did not occur: Refused           Bathing assist       Upper Body Dressing/Undressing Upper body dressing   What is the patient wearing?: Pull over shirt, Orthosis Orthosis activity level: Performed by helper  Upper body assist Assist Level: Total Assistance - Patient < 25%    Lower Body Dressing/Undressing Lower body dressing      What is the patient wearing?: Pants, Incontinence brief     Lower body assist Assist for lower body dressing: 2 Helpers     Toileting Toileting    Toileting assist Assist for toileting: Maximal Assistance - Patient 25 - 49%     Transfers Chair/bed transfer  Transfers assist     Chair/bed transfer assist level: 2 Helpers     Locomotion Ambulation   Ambulation assist      Assist level: 2 helpers   Max distance: 15   Walk 10 feet activity   Assist     Assist level: 2 helpers Assistive device: Walker-rolling   Walk 50 feet activity   Assist Walk 50 feet with 2 turns activity did not occur: Safety/medical concerns         Walk 150 feet activity   Assist Walk 150 feet activity did not occur: Safety/medical concerns         Walk 10 feet on uneven surface  activity   Assist Walk 10 feet on uneven surfaces activity did not occur: Safety/medical concerns         Wheelchair     Assist Will patient use wheelchair at discharge?: Yes Type of Wheelchair: Manual    Wheelchair assist level: Dependent - Patient 0%      Wheelchair 50 feet with 2 turns activity    Assist            Wheelchair 150 feet activity     Assist  CBG (last 3)  Recent Labs    06/11/19 0415 06/11/19 0841 06/11/19 1209  GLUCAP 124* 105* 285*  Blood  pressure (!) 156/74, pulse 76, temperature 97.9 F (36.6 C), temperature source Oral, resp. rate 17, height 5' 9.5" (1.765 m), weight 95.9 kg, SpO2 96 %.  Medical Problem List and Plan: 1.Decreased functional mobilitysecondary to L2-3 spondylolisthesis, L1-2 and 2-3 spinal stenosis compressing L2 and III nerve roots with lumbar radiculopathy. Status post L2-3 and L1-2 laminotomy foraminotomies posterior lumbar interbody fusion 06/05/2019.  -admit to inpatient rehab -LSO when out of bed.  8/11- incision looks good- minimal erythema/edema- no drainage seen. 2. Antithrombotics: -DVT/anticoagulation:SCDs. Check vascular study 8/11- Negative for DVTs -antiplatelet therapy: N/A 3. Pain Management:Flexeril 10 mg 3 times daily as needed, oxycodone 5 mg every 3 hours as needed. Monitor mental status as pt demonstrating delayed processing some disorientation today  8/11- pain is well controlled per nursing- might need to back off if cognition still slow 4. Mood:Rizadyne16 mg daily -antipsychotic agents: N/A 5. Neuropsych: This patientiscapable of making decisions on her own behalf. 6. Skin/Wound Care:Routine skin checks 7. Fluids/Electrolytes/Nutrition:Routine in and outs with follow-up chemistries 8. ID. Keflex 500 mg twice daily x7 days for serosanguineous back drainage.  8/11- looking better 9.Acute on chronic anemia. Follow-up CBC. Continue iron supplement 10.Diabetes mellitus. Hemoglobin A1c 6.5. Glucophage 500 mg twice daily, Actos 30 mg daily.   8/11- BGs 87-285- more normal, but had a spike- will cont' to monitor 11.Hypertension. Avapro 150 mg daily, HCTZ 12.5 mg daily, Norvasc 10 mg daily. Monitor with increased mobility 12.CKD stage II. Creatinine baseline 1.25-1.57. Follow-up chemistries  8/11- Cr 1.4- will monitor 13. Hyperlipidemia. Crestor 14. Constipation. Continue Linzess -sorbitol +/-  enema today if no BM by dinner   8/11- will check with nursing 15. Dispo- home with daughter    LOS: 1 days A FACE TO FACE EVALUATION WAS PERFORMED  Abbygayle Helfand 06/11/2019, 4:33 PM

## 2019-06-12 ENCOUNTER — Inpatient Hospital Stay (HOSPITAL_COMMUNITY): Payer: Medicare Other | Admitting: Physical Therapy

## 2019-06-12 ENCOUNTER — Encounter (HOSPITAL_COMMUNITY): Payer: Medicare Other | Admitting: Psychology

## 2019-06-12 ENCOUNTER — Inpatient Hospital Stay (HOSPITAL_COMMUNITY): Payer: Medicare Other | Admitting: Occupational Therapy

## 2019-06-12 ENCOUNTER — Encounter (HOSPITAL_COMMUNITY): Payer: Self-pay

## 2019-06-12 LAB — GLUCOSE, CAPILLARY
Glucose-Capillary: 219 mg/dL — ABNORMAL HIGH (ref 70–99)
Glucose-Capillary: 277 mg/dL — ABNORMAL HIGH (ref 70–99)
Glucose-Capillary: 146 mg/dL — ABNORMAL HIGH (ref 70–99)
Glucose-Capillary: 89 mg/dL (ref 70–99)

## 2019-06-12 NOTE — Progress Notes (Signed)
Occupational Therapy Session Note  Patient Details  Name: [name] Mathews MRN: [id number] Date of Birth: 12-03-1933  Today's Date: 06/12/2019 OT Individual Time: 7106-2694 OT Individual Time Calculation (min): 55 min    Short Term Goals: Week 1:  OT Short Term Goal 1 (Week 1): Pt will complete bathing with max assist of one caregiver OT Short Term Goal 2 (Week 1): Pt will complete UB dressing with mod assist OT Short Term Goal 3 (Week 1): Pt will complete toilet transfer with max assist of one caregiver OT Short Term Goal 4 (Week 1): Pt will complete LB dressing with max assist of one caregiver OT Short Term Goal 5 (Week 1): Pt will don LSO with mod assist  Skilled Therapeutic Interventions/Progress Updates:    Treatment session with focus on arousal and participation in functional mobility.  Pt asleep upon arrival requiring cold cloth and frequent multimodal cues to initiate arousal - never fully able to maintain eyes open and aroused.  Engaged in sit > stand to transfer back to bed with +2 assist.  Pt reporting need to toilet.  BSC placed behind pt.  Pt voided bowel and bladder.  +2 for sit > stand and max-total assist for standing balance while 2nd person completed hygiene.  Stand pivot back to bed mod assist +2 with frequent cues for sequencing and therapist repositioning RW during transfer.  Pt returned to supine and left semi-reclined in bed with all needs in reach.  [name] present throughout session, providing encouragement and baseline function.  [name] concerned about pt decreased arousal and inability to maintain arousal or follow commands.    Therapy Documentation Precautions:  Precautions Precautions: Fall, Back Required Braces or Orthoses: Spinal Brace Spinal Brace: Lumbar corset, Applied in sitting position Restrictions Weight Bearing Restrictions: No Pain:  Pt with no c/o pain, however grimacing with movement   Therapy/Group: Individual Therapy  Simonne Come 06/12/2019, 3:10 PM

## 2019-06-12 NOTE — Progress Notes (Signed)
Patient noted alert & responsive. No c/o pain or discomfort. She responds approximately the same as yesterday. She still has delays in responses & moves at the same slow rate. She still could not put the medicine cup or other cup to her mouth.No acute distress noted. Will continue to monitor

## 2019-06-12 NOTE — Consult Note (Signed)
Neuropsychological Consultation   Patient:   [name] Mathews   DOB:   07-Apr-1934  MR Number:  [id number]  Location:  [facility] [address] [facility] [place] [id number] [place] [address] Dept: [place]: [phone]           Date of Service:   06/12/2019  Start Time:   11 AM End Time:   12 PM  Provider/Observer:  [name] Mathews, Psy.D.       Clinical Neuropsychologist       Billing Code/Service: [phone]  Chief Complaint:    [name] Mathews is an 85 year old female with history of diabetes, memory deficits, CKD, hyperlipidemia, hypertension, and chronic back pain.  Past history of L3-4 and 4-5  Decompression and fusion years ago.  Presented 06/05/2019 with recurrent back pain radiating to the lower extremities.  Lumbar MRI and imaging demonstrated stenosis spondylolisthesis with radiculopathy at L2-3.  Patient underwent bilateral laminotomy to decompress nerve roots as well as posterior lumbar interbody fusion.  Patient admitted for [facility] after initially recovery from surgery.  Patient has had periods of confusion and today patient was disoriented to year but not month or place.    Reason for Service:  NTI:RWER S [name] Mathews an 85 year old right-handed female history of diabetes mellitus, memory deficits, CKD stage II with creatinine baseline 1.25-1.57, hyperlipidemia hypertension, normocytic anemia as well as chronic back pain as well as history of L3-4 and 4-5 decompression and fusion years ago. Per chart review independent with assistive device prior to admission. She lives with his daughter. Two-level level home with ramped entrance with bed and bath on main level. Patient had been receiving home health therapies for chronic back pain. Presented 06/05/2019 with recurrent back pain radiating to the lower extremities. Lumbar MRI and imaging demonstrated stenosis spondylolisthesis with radiculopathy at L2-3. Underwent  bilateral L2-3 and L1-2 laminotomy foraminotomies to decompress the bilateral L2 and L3 nerve roots as well as posterior lumbar interbody fusion 06/05/2019 per Dr. Arnoldo Morale. Back brace out of bed applied in sitting position. Hospital course pain management. Initial bouts of confusion monitoring use of narcotics. Patient did develop some serosanguineous drainage from wound site placed on Keflex 06/07/2019. Acute blood loss anemia 8.4 and monitored. Therapy evaluations completed and patient was admitted for a comprehensive rehab program.  Current Status:  The patient has improved but continued cognitive deficits, some or all of which may have been there pre surgery.  Patient with slowed information processing and mild memory deficits.     Behavioral Observation: [name] Mathews  presents as a 85 y.o.-year-old Right African American Female who appeared her stated age. her dress was Appropriate and she was Well Groomed and her manners were Appropriate to the situation.  her participation was indicative of Appropriate and Inattentive behaviors.  There were any physical disabilities noted.  she displayed an appropriate level of cooperation and motivation.     Interactions:    Active Appropriate and Inattentive  Attention:   abnormal and attention span appeared shorter than expected for age  Memory:   abnormal; remote memory intact, recent memory impaired  Visuo-spatial:  not examined  Speech (Volume):  low  Speech:   normal; slowed response  Thought Process:  Coherent and Relevant  Though Content:  WNL; not suicidal and not homicidal  Orientation:   person, place and situation  Judgment:   Fair  Planning:   Poor  Affect:    Lethargic  Mood:  Dysphoric  Insight:   Fair  Intelligence:   normal  Medical History:   Past Medical History:  Diagnosis Date  . Anemia    hx of  . Arthritis   . Breast lump    hx of  . Cataract    left eye  . Chronic back pain   . Diabetes mellitus    . Hyperlipidemia   . Hypertension    [facility]  . Normochromic normocytic anemia 05/22/2017   Hb 10 MCV 85.6 No monoclonal spike on SPEP  Creatinine 1.3-1.6  . Peripheral vascular disease (HCC)    Psychiatric History:  No prior psych history.  Family Med/Psych History: History reviewed. No pertinent family history.  Risk of Suicide/Violence: virtually non-existent   Impression/DX:  [name] Mathews is an 85 year old female with history of diabetes, memory deficits, CKD, hyperlipidemia, hypertension, and chronic back pain.  Past history of L3-4 and 4-5  Decompression and fusion years ago.  Presented 06/05/2019 with recurrent back pain radiating to the lower extremities.  Lumbar MRI and imaging demonstrated stenosis spondylolisthesis with radiculopathy at L2-3.  Patient underwent bilateral laminotomy to decompress nerve roots as well as posterior lumbar interbody fusion.  Patient admitted for CIR after initially recovery from surgery.  Patient has had periods of confusion and today patient was disoriented to year but not month or place.    The patient has improved but continued cognitive deficits, some or all of which may have been there pre surgery.  Patient with slowed information processing and mild memory deficits.          Electronically Signed   _______________________ [name] Mathews, Psy.D.

## 2019-06-12 NOTE — Progress Notes (Signed)
Occupational Therapy Session Note  Patient Details  Name: [name] Mathews MRN: [id number] Date of Birth: 10-26-1934  Today's Date: 06/12/2019 OT Individual Time: 1000-1100 OT Individual Time Calculation (min): 60 min    Short Term Goals: Week 1:  OT Short Term Goal 1 (Week 1): Pt will complete bathing with max assist of one caregiver OT Short Term Goal 2 (Week 1): Pt will complete UB dressing with mod assist OT Short Term Goal 3 (Week 1): Pt will complete toilet transfer with max assist of one caregiver OT Short Term Goal 4 (Week 1): Pt will complete LB dressing with max assist of one caregiver OT Short Term Goal 5 (Week 1): Pt will don LSO with mod assist  Skilled Therapeutic Interventions/Progress Updates:    Treatment session with focus on increased initiation and participation during self-care tasks.  Pt received upright in recliner agreeable to therapy session.  Pt reports already washing LB during PT session, therefore engaged in UB bathing and grooming seated at sink.  Pt required frequent cues and encouragement for initiation, ultimately requiring max assist for bathing and total assist for dressing due to decreased initiation.  Therapist provided setup assist to open and apply toothpaste to toothbrush, then able to complete oral care.  LB dressing with total assist due to decreased initiation.  Sit > stand max assist +2 to pull pants over hips.  Pt reports need to toilet.  Completed sit > stand in Syl[place] with max assist and transferred to toilet.  Pt incontinent of BM.  Max assist sit > stand from toilet with Stedy bar to allow therapist to complete hygiene.  +2 for visual cues for positioning and encouragement.  Returned to recliner and left upright with all needs in reach and seat belt alarm intact.  Therapy Documentation Precautions:  Precautions Precautions: Fall, Back Required Braces or Orthoses: Spinal Brace Spinal Brace: Lumbar corset, Applied in sitting  position Restrictions Weight Bearing Restrictions: No Pain:  Pt with no c/o pain   Therapy/Group: Individual Therapy  Simonne Come 06/12/2019, 12:05 PM

## 2019-06-12 NOTE — Progress Notes (Signed)
Social Work Patient ID: [name] Mathews, female   DOB: 06/20/1934, 85 y.o.   MRN: [id number]   CSW met with pt and her dtr today to update them on team conference discussion.  Explained that pt is just beginning her program at [facility], so no d/c date was able to be set, but team feels pt will need 3 to 3 1/2 weeks.  They expressed understanding and dtr does not want to rush things anyway.  Plan is for pt to return to dtr's home at d/c where she was living PTA.  No current questions/needs/concerns at this time.  CSW will continue to follow and assist as needed.  

## 2019-06-12 NOTE — Progress Notes (Signed)
Physical Therapy Session Note  Patient Details  Name: [name] Mathews MRN: [id number] Date of Birth: 1934-05-06  Today's Date: 06/12/2019 PT Individual Time: 0800-0855 PT Individual Time Calculation (min): 55 min   Short Term Goals: Week 1:  PT Short Term Goal 1 (Week 1): pt will consistently perform sit <> stand transfers with max A PT Short Term Goal 2 (Week 1): pt will perform gait x 25' in controlled environment with mod A  Skilled Therapeutic Interventions/Progress Updates:   Pt sitting in full upright in supine, in front of breakfast tray. Pt noted to be uncomfortable and grimacing, trying to reposition herself. When asked if she was in pain, she stated "I don't know". But when told she looks like she's in pain, she confirmed she was. With encouragement, pt agreeable to attempt eating in recliner for improved back support and positioning. Supine>sit via log roll w/ max assist, donned LSO w/ total assist and min-mod assist for static sitting balance when unsupported by UEs.   Multiple sit<>stands in stedy w/ max assist x1, pt was noted to be incontinent of bowel and had incontinent episode of urine once in standing. Pt able to maintain full upright static stance in stedy w/ CGA and verbal cues to bring hips forward, 2nd helper performing pericare and brief management. Stedy transfer to recliner. Breakfast tray placed in front of pt, however pt not initiating any self-feeding. Pt confirmed she did not like what was on her tray, agreeable to eat a yogurt from nurses station. Needed hand-over-hand assist to initiate self-feeding w/ spoon, occasional min manual assist to get yogurt on spoon. Pt intermittently falling asleep, min cues to attend to task and stay awake. Encouragement to eat for energy and healing. Pt w/ difficulty responding to open ended questions, more consistent w/ yes/no questions. Per MD, her family states she was slow to "get going" at baseline. Pt agreeable to drink ensure as  well, min hand-over-hand manual assist to bring ensure to mouth to drink.   Pt stated she was comfortable in recliner. Ended session sitting fully upright in recliner to work on OOB/upright tolerance, all needs in reach.   Therapy Documentation Precautions:  Precautions Precautions: Fall, Back Required Braces or Orthoses: Spinal Brace Spinal Brace: Lumbar corset, Applied in sitting position Restrictions Weight Bearing Restrictions: No Vital Signs: Therapy Vitals Temp: 98.2 F (36.8 C) Pulse Rate: 80 Resp: 19 BP: (!) 155/73 Patient Position (if appropriate): Lying Oxygen Therapy SpO2: 94 % O2 Device: Room Air   Therapy/Group: Individual Therapy  Catalena Stanhope Clent Demark 06/12/2019, 9:13 AM

## 2019-06-12 NOTE — Patient Care Conference (Signed)
Inpatient RehabilitationTeam Conference and Plan of Care Update Date: 06/12/2019   Time: 9:45 AM    Patient Name: [name] GanderLila S Mathews      Medical Record Number: [id number]  Date of Birth: [birth year] Sex: Female         Room/Bed: 4M12C/4M12C-01 Payor Info: Payor: MEDICARE / Plan: MEDICARE PART A AND B / Product Type: *No Product type* /    Admitting Diagnosis: 3. SCI Team Other Ortho, Lumbar spondylolisthesis   Admit Date/Time:  06/10/2019  3:15 PM Admission Comments: No comment available   Primary Diagnosis:  <principal problem not specified> Principal Problem: <principal problem not specified>  Patient Active Problem List   Diagnosis Date Noted  . Lumbar radiculopathy 06/10/2019  . Lumbar adjacent segment disease with spondylolisthesis 06/05/2019  . Hypersomnia with long sleep time, idiopathic 01/17/2018  . Snoring 01/17/2018  . Sleep apnea with hypersomnolence 01/17/2018  . Excessive daytime sleepiness 01/17/2018  . Normochromic normocytic anemia 05/22/2017  . Radiculopathy 07/11/2012  . HTN (hypertension) 07/08/2012  . Diabetes mellitus (HCC) 07/08/2012  . Encephalopathy acute 07/08/2012  . Tachycardia 07/08/2012  . UTI (lower urinary tract infection) 07/08/2012    Expected Discharge Date: Expected Discharge Date: (3-3.5 weeks)  Team Members Present: Physician leading conference: Dr. Genice RougeMegan Lovorn Social Worker Present: Staci AcostaJenny Aasha Dina, LCSW Nurse Present: Kennyth ArnoldStacey Jennings, RN PT Present: Peter Congoaylor Turkalo, PT OT Present: Amy Rounds, OT     Current Status/Progress Goal Weekly Team Focus  Medical   Lumbar radiculopathy s/p lamintomies/formainotomies and PLIF, impaired attention/wakefulness- very sedated  wake pt up so can particpate in therapy  pain control and wakefulness   Bowel/Bladder   incontinenet of bowel & bladder, LBM report as 06/04/19. Was given sorbitol 2 nights in a row plus senna s  regular bowel & bladder patterns  monitor, bowel & bladder programs    Swallow/Nutrition/ Hydration             ADL's   +2 for bathing/dressing, sit > stand and transfers  Min assist  activity tolerance, initiation, arousal, increased participation, ADL retraining   Mobility   +2 assist for transfers, gait with RW, max A for bed mobility  min A gait and transfers  activity tolerance, initiation   Communication             Safety/Cognition/ Behavioral Observations            Pain   no c/o pain during shift tonight or last night, has tylenol, oxycodone & flexeril prn  pain scale <2/10  assess & treat as needed   Skin   honeycomb dressing to her mid & lower back  no new areas of skin break down  assess q Shift    Rehab Goals Patient on target to meet rehab goals: Yes Rehab Goals Revised: none *See Care Plan and progress notes for long and short-term goals.     Barriers to Discharge  Current Status/Progress Possible Resolutions Date Resolved   Physician    Medical stability;Incontinence;Weight;Other (comments)  wakefulness- pt very sedated and needs to wake up/have improved initiation  limited eval so far due to sedation- so far max assist of 2- total assist  if not better in 1-2 days, will order ST evaluation- might try Ritalin, but pt shouldn't have congitive impairment/brain disorder      Nursing                  PT  OT                  SLP                SW                Discharge Planning/Teaching Needs:  Pt plans to return to her dtr's home where she has been living.  Dtr does not want pt to go to SNF due to COVID-19.  Family education to occur closer to d/c.  Dtr is here daily for visits and plans to observe therapy when she is here.   Team Discussion:  Pt with back surgery and is on Keflex for back incision, although Dr. Dagoberto Ligas looked at it today and stated that it looks okay.  Pt has some memory deficits and delayed/poor initiation.  Team is asking if pt needs speech evaluation.Pt's blood sugars are a little  elevated, she's not on insulin at home.  Pt evaluated yesterday for OT at +2 to total A and min to mod A for stand pivot with rolling walker.  Pt walked 15' with rolling walker, just had some lethargy.  Pt has min A overall goals.  Revisions to Treatment Plan:  none    Continued Need for Acute Rehabilitation Level of Care: The patient requires daily medical management by a physician with specialized training in physical medicine and rehabilitation for the following conditions: Daily direction of a multidisciplinary physical rehabilitation program to ensure safe treatment while eliciting the highest outcome that is of practical value to the patient.: Yes Daily medical management of patient stability for increased activity during participation in an intensive rehabilitation regime.: Yes Daily analysis of laboratory values and/or radiology reports with any subsequent need for medication adjustment of medical intervention for : Post surgical problems;Neurological problems;Diabetes problems;Wound care problems;Other   I attest that I was present, lead the team conference, and concur with the assessment and plan of the team.Team conference was held via web/ teleconference due to [place] - 19.   Kadeem Hyle, Silvestre Mesi 06/12/2019, 3:04 PM

## 2019-06-12 NOTE — Progress Notes (Signed)
[facility] PHYSICAL MEDICINE & REHABILITATION PROGRESS NOTE   Subjective/Complaints: Patient  Reports  "doing fairly well"- c/o a little pain, but said she doesn't know where- doesn't think it's in her back, for some reason.  LBM was this AM- very large- first time in 4 days.  Per nursing, spiking BGs to 200s- not on inssulin at home- on SSI now.  Per therapy, pt has very delayed/slowed initation- hard ot get her to answer open questions.  Objective:   [place] Lower Extremity Venous (dvt)  Result Date: 06/11/2019  Lower Venous Study Indications: Edema.  Comparison Study: no prior Performing Technologist: Jeb LeveringJill Parker RDMS, RVT  Examination Guidelines: A complete evaluation includes B-mode imaging, spectral Doppler, color Doppler, and power Doppler as needed of all accessible portions of each vessel. Bilateral testing is considered an integral part of a complete examination. Limited examinations for reoccurring indications may be performed as noted.  +---------+---------------+---------+-----------+----------+-------+ RIGHT    CompressibilityPhasicitySpontaneityPropertiesSummary +---------+---------------+---------+-----------+----------+-------+ CFV      Full           Yes      Yes                          +---------+---------------+---------+-----------+----------+-------+ SFJ      Full                                                 +---------+---------------+---------+-----------+----------+-------+ FV Prox  Full                                                 +---------+---------------+---------+-----------+----------+-------+ FV Mid   Full                                                 +---------+---------------+---------+-----------+----------+-------+ FV DistalFull                                                 +---------+---------------+---------+-----------+----------+-------+ PFV      Full                                                  +---------+---------------+---------+-----------+----------+-------+ POP      Full           Yes      Yes                          +---------+---------------+---------+-----------+----------+-------+ PTV      Full                                                 +---------+---------------+---------+-----------+----------+-------+ PERO     Full                                                 +---------+---------------+---------+-----------+----------+-------+   +---------+---------------+---------+-----------+----------+-------+  LEFT     CompressibilityPhasicitySpontaneityPropertiesSummary +---------+---------------+---------+-----------+----------+-------+ CFV      Full           Yes      Yes                          +---------+---------------+---------+-----------+----------+-------+ SFJ      Full                                                 +---------+---------------+---------+-----------+----------+-------+ FV Prox  Full                                                 +---------+---------------+---------+-----------+----------+-------+ FV Mid   Full                                                 +---------+---------------+---------+-----------+----------+-------+ FV DistalFull                                                 +---------+---------------+---------+-----------+----------+-------+ PFV      Full                                                 +---------+---------------+---------+-----------+----------+-------+ POP      Full           Yes      Yes                          +---------+---------------+---------+-----------+----------+-------+ PTV      Full                                                 +---------+---------------+---------+-----------+----------+-------+ PERO     Full                                                 +---------+---------------+---------+-----------+----------+-------+     Summary:  Right: There is no evidence of deep vein thrombosis in the lower extremity. No cystic structure found in the popliteal fossa. Left: There is no evidence of deep vein thrombosis in the lower extremity. No cystic structure found in the popliteal fossa.  *See table(s) above for measurements and observations. Electronically signed by Coral ElseVance Brabham MD on 06/11/2019 at 8:45:32 PM.    Final    Recent Labs    06/11/19 0603  WBC 7.5  HGB 8.1*  HCT 24.4*  PLT 183   Recent Labs    06/11/19 0603  NA 135  K 3.9  CL 99  CO2 23  GLUCOSE 124*  BUN 37*  CREATININE 1.46*  CALCIUM 8.6*    Intake/Output Summary (Last 24 hours) at 06/12/2019 1309 Last data filed at 06/12/2019 0900 Gross per 24 hour  Intake 280 ml  Output -  Net 280 ml     Physical Exam: Vital Signs Blood pressure (!) 155/73, pulse 80, temperature 98.2 F (36.8 C), resp. rate 19, height 5' 9.5" (1.765 m), weight 98.4 kg, SpO2 94 %.  Constitutional: pt awake, alert, slowed initiation- couldn't get yogurt spoon to mouth with any speed, sitting up in manual w/c, NAD, wearing LSO HENT:  Head:Normocephalicand atraumatic. Wearing eyeglasses  Eyes: EOMIs intact grossly Cardiovascular:RRR, no M/R/G Respiratory:Effort normal. Norespiratory distress. She hasno wheezes, rales or rhomchi.  KZ:SWFU, NT, soft, ND, (+)BS Musculoskeletal:  wearing LSO- soft brace Neurological: She is alert.  Skin: honeycomb see through dressing on lumbar incision- no drainage noted- mild/trace erythema and trace edema of the local area Psychiatric:  Flat but overall pleasant- mild delay still in answering questions- seemed to perk up slightly for me; not for OT.    Assessment/Plan: 1. Functional deficits secondary to L1-L3 laninotomy/PLIF which require 3+ hours per day of interdisciplinary therapy in a comprehensive inpatient rehab setting.  Physiatrist is providing close team supervision and 24 hour management of active medical problems  listed below.  Physiatrist and rehab team continue to assess barriers to discharge/monitor patient progress toward functional and medical goals  Care Tool:  Bathing  Bathing activity did not occur: Refused Body parts bathed by patient: Right arm, Left arm, Chest, Face   Body parts bathed by helper: Front perineal area, Buttocks, Right upper leg, Left upper leg, Right lower leg, Left lower leg, Abdomen     Bathing assist Assist Level: 2 Helpers     Upper Body Dressing/Undressing Upper body dressing   What is the patient wearing?: Pull over shirt, Orthosis Orthosis activity level: Performed by helper  Upper body assist Assist Level: Total Assistance - Patient < 25%    Lower Body Dressing/Undressing Lower body dressing      What is the patient wearing?: Pants, Incontinence brief     Lower body assist Assist for lower body dressing: 2 Helpers     Toileting Toileting    Toileting assist Assist for toileting: 2 Helpers     Transfers Chair/bed transfer  Transfers assist     Chair/bed transfer assist level: Dependent - mechanical lift     Locomotion Ambulation   Ambulation assist      Assist level: 2 helpers   Max distance: 15   Walk 10 feet activity   Assist     Assist level: 2 helpers Assistive device: Walker-rolling   Walk 50 feet activity   Assist Walk 50 feet with 2 turns activity did not occur: Safety/medical concerns         Walk 150 feet activity   Assist Walk 150 feet activity did not occur: Safety/medical concerns         Walk 10 feet on uneven surface  activity   Assist Walk 10 feet on uneven surfaces activity did not occur: Safety/medical concerns         Wheelchair     Assist Will patient use wheelchair at discharge?: Yes Type of Wheelchair: Manual    Wheelchair assist level: Dependent - Patient 0%      Wheelchair 50 feet with 2 turns activity    Assist  Wheelchair 150 feet activity      Assist  CBG (last 3)  Recent Labs    06/11/19 2120 06/12/19 0636 06/12/19 1130  GLUCAP 166* 219* 277*            Blood pressure (!) 155/73, pulse 80, temperature 98.2 F (36.8 C), resp. rate 19, height 5' 9.5" (1.765 m), weight 98.4 kg, SpO2 94 %.  Medical Problem List and Plan: 1.Decreased functional mobilitysecondary to L2-3 spondylolisthesis, L1-2 and 2-3 spinal stenosis compressing L2 and III nerve roots with lumbar radiculopathy. Status post L2-3 and L1-2 laminotomy foraminotomies posterior lumbar interbody fusion 06/05/2019.  -admit to inpatient rehab -LSO when out of bed.  8/11- incision looks good- minimal erythema/edema- no drainage seen. 2. Antithrombotics: -DVT/anticoagulation:SCDs. Check vascular study 8/11- Negative for DVTs -antiplatelet therapy: N/A 3. Pain Management:Flexeril 10 mg 3 times daily as needed, oxycodone 5 mg every 3 hours as needed. Monitor mental status as pt demonstrating delayed processing some disorientation today  8/11- pain is well controlled per nursing- might need to back off if cognition still slow 4. Mood:Rizadyne16 mg daily -antipsychotic agents: N/A 5. Neuropsych: This patientiscapable of making decisions on her own behalf. 6. Skin/Wound Care:Routine skin checks 7. Fluids/Electrolytes/Nutrition:Routine in and outs with follow-up chemistries 8. ID. Keflex 500 mg twice daily x7 days for serosanguineous back drainage.  8/11- looking better 9.Acute on chronic anemia. Follow-up CBC. Continue iron supplement 10.Diabetes mellitus. Hemoglobin A1c 6.5. Glucophage 500 mg twice daily, Actos 30 mg daily.   8/11- BGs 87-285- more normal, but had a spike- will cont' to monitor  8/12- on home regimen, and SSI- spiking BGs- will monitor and increase metformin if needed 11.Hypertension. Avapro 150 mg daily, HCTZ 12.5 mg daily, Norvasc 10 mg daily. Monitor with increased  mobility 12.CKD stage II. Creatinine baseline 1.25-1.57. Follow-up chemistries  8/11- Cr 1.4- will monitor 13. Hyperlipidemia. Crestor 14. Constipation. Continue Linzess -sorbitol +/- enema today if no BM by dinner   8/11- will check with nursing  8/12- large BM this AM- will cont' regimen 15. Dispo- home with daughter  Team conference today 8/12- d/c date in 3-3.5 weeks- no exact date set- will wait on ST for initiation and let pt have 1-2 more days to perk up. If not, will get ST    LOS: 2 days A FACE TO FACE EVALUATION WAS PERFORMED  [name] Mathews 06/12/2019, 1:09 PM

## 2019-06-13 ENCOUNTER — Inpatient Hospital Stay (HOSPITAL_COMMUNITY): Payer: Medicare Other | Admitting: Rehabilitation

## 2019-06-13 ENCOUNTER — Inpatient Hospital Stay (HOSPITAL_COMMUNITY): Payer: Medicare Other | Admitting: Occupational Therapy

## 2019-06-13 DIAGNOSIS — E1169 Type 2 diabetes mellitus with other specified complication: Secondary | ICD-10-CM

## 2019-06-13 DIAGNOSIS — D649 Anemia, unspecified: Secondary | ICD-10-CM

## 2019-06-13 DIAGNOSIS — K5903 Drug induced constipation: Secondary | ICD-10-CM

## 2019-06-13 DIAGNOSIS — N182 Chronic kidney disease, stage 2 (mild): Secondary | ICD-10-CM

## 2019-06-13 DIAGNOSIS — R0989 Other specified symptoms and signs involving the circulatory and respiratory systems: Secondary | ICD-10-CM

## 2019-06-13 DIAGNOSIS — R7309 Other abnormal glucose: Secondary | ICD-10-CM

## 2019-06-13 DIAGNOSIS — E669 Obesity, unspecified: Secondary | ICD-10-CM

## 2019-06-13 LAB — GLUCOSE, CAPILLARY
Glucose-Capillary: 146 mg/dL — ABNORMAL HIGH (ref 70–99)
Glucose-Capillary: 142 mg/dL — ABNORMAL HIGH (ref 70–99)
Glucose-Capillary: 141 mg/dL — ABNORMAL HIGH (ref 70–99)
Glucose-Capillary: 117 mg/dL — ABNORMAL HIGH (ref 70–99)

## 2019-06-13 MED ORDER — LINACLOTIDE 145 MCG PO CAPS: 145.0000 ug | ORAL_CAPSULE | Freq: Every day | ORAL | Status: DC | PRN

## 2019-06-13 NOTE — Progress Notes (Signed)
Occupational Therapy Session Note  Patient Details  Name: [name] Mathews MRN: [id number] Date of Birth: 07-Oct-1934  Today's Date: 06/13/2019 OT Individual Time: 5188-4166 OT Individual Time Calculation (min): 65 min  and Today's Date: 06/13/2019 OT Missed Time: 10 Minutes Missed Time Reason: Patient fatigue   Short Term Goals: Week 1:  OT Short Term Goal 1 (Week 1): Pt will complete bathing with max assist of one caregiver OT Short Term Goal 2 (Week 1): Pt will complete UB dressing with mod assist OT Short Term Goal 3 (Week 1): Pt will complete toilet transfer with max assist of one caregiver OT Short Term Goal 4 (Week 1): Pt will complete LB dressing with max assist of one caregiver OT Short Term Goal 5 (Week 1): Pt will don LSO with mod assist  Skilled Therapeutic Interventions/Progress Updates:    Treatment session with focus on self-care retraining and initiation.  Pt received supine in bed, more alert and responsive than previous sessions.  Pt declined bathing, but agreeable to dressing this session.  Completed bed mobility with mod assist and cues for back precautions during mobility.  Noted increased initiation with dressing, however still required total assist to complete any aspect of dressing tasks despite given increased time to complete.  Utilized [place] for sit > stand for LB dressing.  Pt reports need to toilet.  Incontinent of urine prior to transfer on to toilet.  Engaged in hygiene at sit > stand level in [place].  Max assist for sit > stand from EOB and toilet.  Required +2 for sit> stand from recliner chair.  Pt requiring more increased time throughout session as she fatigued.  Also requiring increased cues for initiation and to respond to questions.  Pt remained upright in recliner with seat belt alarm on and all needs in reach.  Therapy Documentation Precautions:  Precautions Precautions: Fall, Back Required Braces or Orthoses: Spinal Brace Spinal Brace: Lumbar corset,  Applied in sitting position Restrictions Weight Bearing Restrictions: No General: General OT Amount of Missed Time: 10 Minutes Pain:  Pt with no c/o pain   Therapy/Group: Individual Therapy  Simonne Come 06/13/2019, 10:54 AM

## 2019-06-13 NOTE — IPOC Note (Signed)
Overall Plan of Care [facility]) Patient Details Name: [name] Mathews MRN: [id number] DOB: October 31, 1934  Admitting Diagnosis: <principal problem not specified>  Hospital Problems: Active Problems:   Lumbar radiculopathy     Functional Problem List: Nursing Bladder, Bowel, Edema, Endurance, Medication Management, Pain, Safety  PT Balance, Endurance, Motor, Pain, Safety  OT Balance, Cognition, Endurance, Motor, Pain, Safety  SLP    TR         Basic ADL's: OT Grooming, Bathing, Dressing, Toileting     Advanced  ADL's: OT       Transfers: PT Bed Mobility, Bed to Chair, Car, Manufacturing systems engineer, Metallurgist: PT Stairs, Emergency planning/management officer, Ambulation     Additional Impairments: OT None  SLP        TR      Anticipated Outcomes Item Anticipated Outcome  Self Feeding    Swallowing      Basic self-care  Min assist  Toileting  Min assist   Bathroom Transfers Min assist  Bowel/Bladder  mod I assist with bowel and min assist with bladder  Transfers  min A  Locomotion  min A  Communication     Cognition     Pain  pain at or below level 4  Safety/Judgment  maintain safety with cues/reminders   Therapy Plan: PT Intensity: Minimum of 1-2 x/day ,45 to 90 minutes PT Frequency: 5 out of 7 days PT Duration Estimated Length of Stay: 18-21 days OT Intensity: Minimum of 1-2 x/day, 45 to 90 minutes OT Frequency: Total of 15 hours over 7 days of combined therapies OT Duration/Estimated Length of Stay: 21-24 days     Due to the current state of emergency, patients may not be receiving their 3-hours of Medicare-mandated therapy.   Team Interventions: Nursing Interventions Patient/Family Education, Bladder Management, Bowel Management, Discharge Planning, Disease Management/Prevention, Pain Management, Medication Management  PT interventions Ambulation/gait training, Discharge planning, Functional mobility training, Therapeutic Activities, Balance/vestibular  training, Neuromuscular re-education, Therapeutic Exercise, Wheelchair propulsion/positioning, Cognitive remediation/compensation, DME/adaptive equipment instruction, Pain management, Splinting/orthotics, UE/LE Strength taining/ROM, UE/LE Coordination activities, Stair training, Patient/family education, Community reintegration  OT Interventions Training and development officer, Cognitive remediation/compensation, Academic librarian, Discharge planning, Disease mangement/prevention, Engineer, drilling, Functional mobility training, Neuromuscular re-education, Pain management, Patient/family education, Psychosocial support, Self Care/advanced ADL retraining, Skin care/wound managment, Splinting/orthotics, Therapeutic Activities, Therapeutic Exercise, UE/LE Strength taining/ROM  SLP Interventions    TR Interventions    SW/CM Interventions Discharge Planning, Psychosocial Support, Patient/Family Education   Barriers to Discharge MD  Medical stability, Wound care, Weight, Behavior and lack of initiation/sedation  Nursing      PT      OT      SLP      SW       Team Discharge Planning: Destination: PT-Home ,OT- Home , SLP-  Projected Follow-up: PT-Home health PT, OT-  Home health OT, 24 hour supervision/assistance, SLP-  Projected Equipment Needs: PT-To be determined, OT- To be determined, SLP-  Equipment Details: PT- , OT-  Patient/family involved in discharge planning: PT- Patient, Family member/caregiver,  OT-Patient, SLP-   MD ELOS: 21-24 days Medical Rehab Prognosis:  Fair Assessment: Pt is an 85 yr old female here for lumbar radiculopathy s/p lumbar laminotomies, and PLIF- she also has DM and has had poorly controlled BGs since admission- her main issue is lack of initiation/sedation/confusion- she does have hx of memory/cognitive deficits which is complicating her stay immeasurably.    See Team Conference Notes for weekly updates  to the plan of care

## 2019-06-13 NOTE — Plan of Care (Signed)
  Problem: SCI BOWEL ELIMINATION Goal: RH STG MANAGE BOWEL WITH ASSISTANCE Description: STG Manage Bowel with min Assistance. Outcome: Not Progressing; incontinence   Problem: SCI BLADDER ELIMINATION Goal: RH STG MANAGE BLADDER WITH ASSISTANCE Description: STG Manage Bladder With min Assistance Outcome: Not Progressing; incontinence

## 2019-06-13 NOTE — Progress Notes (Signed)
Physical Therapy Session Note  Patient Details  Name: [name] Mathews MRN: [id number] Date of Birth: [birth year]  Today's Date: 06/13/2019 PT Individual Time: 1301-1400 PT Individual Time Calculation (min): 59 min   Short Term Goals: Week 1:  PT Short Term Goal 1 (Week 1): pt will consistently perform sit <> stand transfers with max A PT Short Term Goal 2 (Week 1): pt will perform gait x 25' in controlled environment with mod A  Skilled Therapeutic Interventions/Progress Updates:   Pt received sitting in recliner in room, daughter present, agreeable to therapy with encouragement.  Pt seemed more alert initially during session, but did note increasing fatigue towards end of session.  Pt requires mod A to initiate reaching forward for RW.  Daughter reports she does better with hands on RW, therefore had daughter stabilize while standing.  Sit<>stand at max A level from recliner with max cues and facilitation for forward weight shift.  Performed stand pivot transfer to w/c with RW at mod A (daughter had hands on her, however did not need to physically assist).  Max cues for stepping sequence and safety to reach back for chair.  Assisted to small ortho gym for less distraction.  Performed another stand pivot transfer to mat at max A level with cues as above.  Pt reporting that she had gone to the restroom when transferring and that it was urine and bowel movement, therefore transferred back to w/c and back to room.  Once in room, pt able to stand with max A to RW and ambulate x 6' to restroom with multi modal cues for sequencing and safety to get to toilet.  Provided total A for clothing management and pericare as well as donning new brief and pants.  Daughter also assisted with this along with max cues for pt to extend LEs in standing and maintain forward weight shift.  Pt ambulated x 10' back to recliner in room for seated rest break.  Agreeable to perform standing task, hitting beach ball to daughter with  single UE support.  Pt requires min to mod A with cues for maintaining forward weight shift.  Again, max cues to keep hand up to hit ball back.  Pt again needed to sit following approx 8-10 hits.  Pt requesting to return to bed.  Stand pivot as before with max A.  Upon transferring pt reports she had had another bowel movement.  Performed sit>supine with max A, daughter assisting LEs.  Performed several bouts of rolling in order to assist with pericare and changing brief/bed pad.   Pt requires mod A for rolling, but does well reaching for bed rail when cued.  Pt left in bed with all needs in reach and bed alarm set.  Daughter in room.   Therapy Documentation Precautions:  Precautions Precautions: Fall, Back Required Braces or Orthoses: Spinal Brace Spinal Brace: Lumbar corset, Applied in sitting position Restrictions Weight Bearing Restrictions: No  Pain: Pt did not rate pain during session.     Therapy/Group: Individual Therapy  Denice Bors 06/13/2019, 2:10 PM

## 2019-06-13 NOTE — Progress Notes (Signed)
[place] PHYSICAL MEDICINE & REHABILITATION PROGRESS NOTE   Subjective/Complaints: Patient seen laying in bed this morning.  She states she slept well overnight.  She denies complaints. Patient seen and examined with resident physician.   ROS.  Denies CP, shortness of breath, nausea, vomiting, diarrhea.  Objective:   No results found. Recent Labs    06/11/19 0603  WBC 7.5  HGB 8.1*  HCT 24.4*  PLT 183   Recent Labs    06/11/19 0603  NA 135  K 3.9  CL 99  CO2 23  GLUCOSE 124*  BUN 37*  CREATININE 1.46*  CALCIUM 8.6*    Intake/Output Summary (Last 24 hours) at 06/13/2019 1147 Last data filed at 06/13/2019 0840 Gross per 24 hour  Intake 540 ml  Output -  Net 540 ml     Physical Exam: Vital Signs Blood pressure (!) 128/58, pulse 75, temperature 99.1 F (37.3 C), temperature source Oral, resp. rate 16, height 5' 9.5" (1.765 m), weight 98.4 kg, SpO2 97 %. Constitutional: No distress . Vital signs reviewed. HENT: Normocephalic.  Atraumatic. Eyes: EOMI. No discharge. Cardiovascular: No JVD. Respiratory: Normal effort.  No stridor. GI: Non-distended. Skin: Warm and dry.  Intact. Psych: Normal mood.  Normal behavior. Musc: No edema.  No tenderness. Neurological: Alert Motor: 4-4+/5 throughout Skin: Warm and dry.   Psychiatric: Flat  Assessment/Plan: 1. Functional deficits secondary to L1-L3 laninotomy/PLIF which require 3+ hours per day of interdisciplinary therapy in a comprehensive inpatient rehab setting.  Physiatrist is providing close team supervision and 24 hour management of active medical problems listed below.  Physiatrist and rehab team continue to assess barriers to discharge/monitor patient progress toward functional and medical goals  Care Tool:  Bathing  Bathing activity did not occur: Refused Body parts bathed by patient: Right arm, Left arm, Chest, Face   Body parts bathed by helper: Front perineal area, Buttocks, Right upper leg, Left  upper leg, Right lower leg, Left lower leg, Abdomen     Bathing assist Assist Level: 2 Helpers     Upper Body Dressing/Undressing Upper body dressing   What is the patient wearing?: Pull over shirt Orthosis activity level: Performed by helper  Upper body assist Assist Level: Total Assistance - Patient < 25%    Lower Body Dressing/Undressing Lower body dressing      What is the patient wearing?: Pants, Incontinence brief     Lower body assist Assist for lower body dressing: Total Assistance - Patient < 25%     Toileting Toileting    Toileting assist Assist for toileting: Total Assistance - Patient < 25%     Transfers Chair/bed transfer  Transfers assist     Chair/bed transfer assist level: Dependent - mechanical lift     Locomotion Ambulation   Ambulation assist      Assist level: 2 helpers   Max distance: 15   Walk 10 feet activity   Assist     Assist level: 2 helpers Assistive device: Walker-rolling   Walk 50 feet activity   Assist Walk 50 feet with 2 turns activity did not occur: Safety/medical concerns         Walk 150 feet activity   Assist Walk 150 feet activity did not occur: Safety/medical concerns         Walk 10 feet on uneven surface  activity   Assist Walk 10 feet on uneven surfaces activity did not occur: Safety/medical concerns         Wheelchair  Assist Will patient use wheelchair at discharge?: Yes Type of Wheelchair: Manual    Wheelchair assist level: Dependent - Patient 0%      Wheelchair 50 feet with 2 turns activity    Assist            Wheelchair 150 feet activity     Assist  CBG (last 3)  Recent Labs    06/12/19 2057 06/13/19 0626 06/13/19 1135  GLUCAP 89 146* 142*            Blood pressure (!) 128/58, pulse 75, temperature 99.1 F (37.3 C), temperature source Oral, resp. rate 16, height 5' 9.5" (1.765 m), weight 98.4 kg, SpO2 97 %.  Medical Problem List and  Plan: 1.Decreased functional mobilitysecondary to L2-3 spondylolisthesis, L1-2 and 2-3 spinal stenosis compressing L2 and III nerve roots with lumbar radiculopathy. Status post L2-3 and L1-2 laminotomy foraminotomies posterior lumbar interbody fusion 06/05/2019.  Continue CIR -LSO when out of bed. 2. Antithrombotics: -DVT/anticoagulation:SCDs.   Vascular study negative for DVT on 8/11 -antiplatelet therapy: N/A 3. Pain Management:Flexeril 10 mg 3 times daily as needed, oxycodone 5 mg every 3 hours as needed. Monitor mental status   Stable on 8/13 4. Mood:Rizadyne16 mg daily -antipsychotic agents: N/A 5. Neuropsych: This patientiscapable of making decisions on her own behalf. 6. Skin/Wound Care:Routine skin checks 7. Fluids/Electrolytes/Nutrition:Routine in and outs  8. ID. Continue Keflex 500 mg twice daily x7 days for serosanguineous back drainage. 9.Acute on chronic anemia. Continue iron supplement  Hemoglobin 8.1 on 8/11  Continue to monitor 10.Diabetes mellitus. Hemoglobin A1c 6.5. Glucophage 500 mg twice daily, Actos 30 mg daily.   Labile, but?  Stabilizing on 8/13 11.Hypertension. Avapro 150 mg daily, HCTZ 12.5 mg daily, Norvasc 10 mg daily. Monitor with increased mobility  Labile on 8/13, monitor for trend 12.CKD stage II. Creatinine baseline 1.25-1.57. Follow-up chemistries  Creatinine 1.46 on 8/11  Continue to monitor 13. Hyperlipidemia. Crestor 14. Constipation. Continue Linzess 8/12- large BM this AM-continue regimen 15. Dispo- home with daughter  Team conference today 8/12- d/c date in 3-3.5 weeks- no exact date set- will wait on ST for initiation and let pt have 1-2 more days to perk up. If not, will get ST    LOS: 3 days A FACE TO FACE EVALUATION WAS PERFORMED  Asier Desroches Lorie Phenix 06/13/2019, 11:47 AM

## 2019-06-13 NOTE — Progress Notes (Addendum)
Patient reported to have 3 BM's today and daughter would like to back off Linzess. Will keep Senna bid on board and make Linzess daily prn mild constipation. Nursing advised to monitor and offer Linzess prn no BM X 24 hours.

## 2019-06-14 ENCOUNTER — Inpatient Hospital Stay (HOSPITAL_COMMUNITY): Payer: Medicare Other

## 2019-06-14 ENCOUNTER — Inpatient Hospital Stay (HOSPITAL_COMMUNITY): Payer: Medicare Other | Admitting: Occupational Therapy

## 2019-06-14 ENCOUNTER — Inpatient Hospital Stay (HOSPITAL_COMMUNITY): Payer: Medicare Other | Admitting: Physical Therapy

## 2019-06-14 LAB — GLUCOSE, CAPILLARY
Glucose-Capillary: 127 mg/dL — ABNORMAL HIGH (ref 70–99)
Glucose-Capillary: 143 mg/dL — ABNORMAL HIGH (ref 70–99)
Glucose-Capillary: 118 mg/dL — ABNORMAL HIGH (ref 70–99)
Glucose-Capillary: 143 mg/dL — ABNORMAL HIGH (ref 70–99)

## 2019-06-14 NOTE — Progress Notes (Signed)
Occupational Therapy Session Note  Patient Details  Name: [name] Mathews MRN: [id number] Date of Birth: February 10, 1934  Today's Date: 06/14/2019 OT Individual Time: 6644-0347 OT Individual Time Calculation (min): 45 min    Short Term Goals: Week 1:  OT Short Term Goal 1 (Week 1): Pt will complete bathing with max assist of one caregiver OT Short Term Goal 2 (Week 1): Pt will complete UB dressing with mod assist OT Short Term Goal 3 (Week 1): Pt will complete toilet transfer with max assist of one caregiver OT Short Term Goal 4 (Week 1): Pt will complete LB dressing with max assist of one caregiver OT Short Term Goal 5 (Week 1): Pt will don LSO with mod assist  Skilled Therapeutic Interventions/Progress Updates:    Treatment session with focus on increased initiation and sustained attention during self-care retraining.  Pt received supine in bed awake and agreeable to bathing/dressing this session.  Completed bed mobility with max assist and multimodal cues for hand placement to decrease burden of care.  Engaged in bathing and dressing seated at EOB with focus on sitting balance and initiation.  Pt required min-mod assist for sitting balance as pt with tendency to lose balance backwards as she fatigued.  Max multimodal cues for sequencing and completion of self-care tasks as pt would initiate threading shirt sleeves but then unable to complete due to decreased initiation and sustained attention - however overall improved initiation and participation.  Upon standing to pull pants over hips, pt reports need to toilet.  Completed stand pivot transfer mod assist with +2 for safety to [facility] with RW.  Pt demonstrating improved mobility and initiation during transfer this session.  Pt incontinent of bladder prior to sitting on toilet.  Engaged in hygiene in standing with min assist for standing balance while therapist completed hygiene and clothing management.  Pt transferred stand pivot with RW to recliner again  with mod assist +2 for safety.  Pt left upright in recliner with nurse tech arriving to provide supervision/assist for breakfast.    Therapy Documentation Precautions:  Precautions Precautions: Fall, Back Required Braces or Orthoses: Spinal Brace Spinal Brace: Lumbar corset, Applied in sitting position Restrictions Weight Bearing Restrictions: No Pain:  Pt with no c/o pain   Therapy/Group: Individual Therapy  Simonne Come 06/14/2019, 9:20 AM

## 2019-06-14 NOTE — Progress Notes (Signed)
Physical Therapy Session Note  Patient Details  Name: [name] Mathews MRN: [id number] Date of Birth: [birth year]  Today's Date: 06/14/2019 PT Individual Time: 3536-1443 AND 1400-1443 PT Individual Time Calculation (min): 42 min AND 43 min  Short Term Goals: Week 1:  PT Short Term Goal 1 (Week 1): pt will consistently perform sit <> stand transfers with max A PT Short Term Goal 2 (Week 1): pt will perform gait x 25' in controlled environment with mod A  Skilled Therapeutic Interventions/Progress Updates:   Session 1:  Pt in recliner and agreeable to therapy w/ encouragement, c/o pain but would not rate. Pt also maintained eyes closed majority of session, max cues to open and increase environmental awareness. Pt reported needing to toilet. Multiple attempts at sit>stand to RW. Max verbal/tactile/manual cues for technique and anterior trunk lean, ultimately needing +2 for most stands during session 2/2 cues needed. Ambulated 10' into bathroom w/ min-mod assist. Most assist needed for RW management, esp at turns and w/ UE placement. Toilet transfer w/ max assist and 2nd helper present to assist w/ brief/LE garment management. Pt unable to void and stated she didn't need to go anymore. Ambulated to w/c w/ same assistance detailed above. Total assist w/c transport to/from gym. Max encouragement to engage and continue w/ participating in therapy session. Put on music pt likes and worked on sit<>stands to [employer]. Max +1 to +2 sit<>stands w/ cues for technique, weight shifting, and UE placement. Stood 1 min and then ~30 sec w/ min assist to maintain static stance and max encouragement to continue standing. Pt stated she needed void again and felt it was definite this time. Returned to room and ambulated to/from toilet, unsuccessful w/ voiding again. Brief noted to be soiled of blood - made RN aware. Ambulated 15' to bed and sit>sidelying max assist. Ended session in L sidelying, in care of daughter and all needs  met.   Session 2:  Pt in supine and agreeable to therapy w/ encouragement. Denies pain but reports fatigue. Supine>sit w/ max assist and worked on sit<>stands to [employer]. Pt noted to be incontinent of bowel upon first stance. Returned to supine and performed R/L rolling w/ min assist while therapist and daughter provided total assist for pericare and brief management. Pt able to initiate donning pants in supine, but ultimately needed total assist to bridge and bring pants over hips. Returned to seated EOB and sit>stand from bed w/ max assist x3 reps. Maintained static stance for 30 sec w/ max encouragement to maintain standing, CGA and tactile cues to bring hips forward. Returned to supine and ended session in supine, all needs in reach.   Therapy Documentation Precautions:  Precautions Precautions: Fall, Back Required Braces or Orthoses: Spinal Brace Spinal Brace: Lumbar corset, Applied in sitting position Restrictions Weight Bearing Restrictions: No Vital Signs: Therapy Vitals BP: (!) 106/53  Therapy/Group: Individual Therapy  Antwan Bribiesca Clent Demark 06/14/2019, 11:49 AM

## 2019-06-15 ENCOUNTER — Inpatient Hospital Stay (HOSPITAL_COMMUNITY): Payer: Medicare Other

## 2019-06-15 ENCOUNTER — Inpatient Hospital Stay (HOSPITAL_COMMUNITY): Payer: Medicare Other | Admitting: Physical Therapy

## 2019-06-15 LAB — GLUCOSE, CAPILLARY
Glucose-Capillary: 129 mg/dL — ABNORMAL HIGH (ref 70–99)
Glucose-Capillary: 159 mg/dL — ABNORMAL HIGH (ref 70–99)
Glucose-Capillary: 200 mg/dL — ABNORMAL HIGH (ref 70–99)

## 2019-06-15 MED ORDER — WHITE PETROLATUM EX OINT
TOPICAL_OINTMENT | CUTANEOUS | Status: AC
  Administered 2019-06-15: 18:00:00
  Filled 2019-06-15: qty 28.35

## 2019-06-15 NOTE — Progress Notes (Signed)
Occupational Therapy Session Note  Patient Details  Name: KYMORA SCIARA MRN: [id number] Date of Birth: [birth year]  Today's Date: 06/15/2019 OT Individual Time: 4196-2229 OT Individual Time Calculation (min): 75 min    Short Term Goals: Week 1:  OT Short Term Goal 1 (Week 1): Pt will complete bathing with max assist of one caregiver OT Short Term Goal 2 (Week 1): Pt will complete UB dressing with mod assist OT Short Term Goal 3 (Week 1): Pt will complete toilet transfer with max assist of one caregiver OT Short Term Goal 4 (Week 1): Pt will complete LB dressing with max assist of one caregiver OT Short Term Goal 5 (Week 1): Pt will don LSO with mod assist  Skilled Therapeutic Interventions/Progress Updates:    1:1.Pt received in bed with decreased arousal and initiation throughout session. tp completes sidelying>sitting EOB with MAX A. Pt completes stedy transfer with brace applied with MOD +2 A and VC for initation to transfer into w/c. Pt bathes with mod HOH A for thoroughness and initiation for UB bathing. Total A sit to stand for LB bathing with +2 for balance A. Pt requires mod VC for upright posture in standing during bathing and clothing management. Pt requires MAX A for donning bra and MOD A for donning shirt with VC for sequencing. Pt requires max VC for sequencing and initiation at sink for maintaining eyes open and sequencing through oral care. Pt self feeds apple sauce and coffee with min VC for opening eyes and maintaining grasp on utensil. Eixted session with pt seated in on toilet after stedy transfer as stated above with NT in room supervising toileting  Therapy Documentation Precautions:  Precautions Precautions: Fall, Back Required Braces or Orthoses: Spinal Brace Spinal Brace: Lumbar corset, Applied in sitting position Restrictions Weight Bearing Restrictions: No General:   Vital Signs: Therapy Vitals Temp: 98.3 F (36.8 C) Pulse Rate: 81 Resp: 18 BP: (!)  169/71 Patient Position (if appropriate): Lying Oxygen Therapy SpO2: 97 % O2 Device: Room Air Pain: Pain Assessment Pain Scale: Faces Faces Pain Scale: Hurts whole lot Pain Type: Surgical pain Pain Location: Back Pain Orientation: Mid;Lower Pain Descriptors / Indicators: Aching Pain Frequency: Intermittent Pain Onset: On-going Pain Intervention(s): Medication (See eMAR) ADL: ADL ADL Comments: Total assist for all due to lethargy and decreased ability to follow commands Vision   Perception    Praxis   Exercises:   Other Treatments:     Therapy/Group: Individual Therapy  Tonny Branch 06/15/2019, 9:15 AM

## 2019-06-15 NOTE — Plan of Care (Signed)
  Problem: SCI BOWEL ELIMINATION Goal: RH STG MANAGE BOWEL WITH ASSISTANCE Description: STG Manage Bowel with min Assistance. Outcome: Not Progressing; loose stools daughter requesting to  d/c bowel meds and  have colace every other day same regimen they have at home.   Problem: SCI BLADDER ELIMINATION Goal: RH STG MANAGE BLADDER WITH ASSISTANCE Description: STG Manage Bladder With min Assistance Outcome: Not Progressing; ioncontinence   Problem: RH KNOWLEDGE DEFICIT SCI Goal: RH STG INCREASE KNOWLEDGE OF SELF CARE AFTER SCI Description: Pt will be able to direct care using handouts/educational materials with cues/reminders and caregiver will be able to provide care using educational information independently Outcome: Not Progressing; some confusion and delayed response

## 2019-06-15 NOTE — Progress Notes (Signed)
Physical Therapy Session Note  Patient Details  Name: [name] Mathews MRN: [id number] Date of Birth: Mar 03, 1934  Today's Date: 06/15/2019 PT Individual Time: 0240-9735 PT Individual Time Calculation (min): 38 min   Short Term Goals: Week 1:  PT Short Term Goal 1 (Week 1): pt will consistently perform sit <> stand transfers with max A PT Short Term Goal 2 (Week 1): pt will perform gait x 25' in controlled environment with mod A  Skilled Therapeutic Interventions/Progress Updates:  Pt was seen bedside in the pm with pt's daughter at bedside. Pt out of bed to recliner. Pt very sleepy requiring constant verbal cues to maintain level of arousal. Pt received pain medicine this am. Pt transferred sit to stand with +2 mod A for safety with rolling walker. Pt transferred to w/c with +2 mod A for safety due to increased fatigue. Pt transported to bathroom. Pt transferred w/c to commode with grab bar and +2 mod A for safety. Pt continue to require constant verbal cues to maintain level of arousal. Pt transferred commode to w/c with +2 mod A for safety. Dependent for donning and doffing brief, unsuccessful with toileting. Pt transferred sit to stand from w/c with rolling walker and +2 mod A for safety. Pt transferred from edge of bed to supine with mod A and verbal cues. Pt moved up in bed with +2 assist. Treatment ended due to inability to maintain arousal to actively participate. Pt left sitting up in bed with daughter at bedside.   Therapy Documentation Precautions:  Precautions Precautions: Fall, Back Required Braces or Orthoses: Spinal Brace Spinal Brace: Lumbar corset, Applied in sitting position Restrictions Weight Bearing Restrictions: No General: PT Amount of Missed Time (min): 22 Minutes PT Missed Treatment Reason: Patient fatigue Vital Signs:  Pain: Pain Assessment Pain Score: Asleep  Therapy/Group: Individual Therapy  Dub Amis 06/15/2019, 1:43 PM

## 2019-06-16 ENCOUNTER — Inpatient Hospital Stay (HOSPITAL_COMMUNITY): Payer: Medicare Other

## 2019-06-16 LAB — GLUCOSE, CAPILLARY
Glucose-Capillary: 132 mg/dL — ABNORMAL HIGH (ref 70–99)
Glucose-Capillary: 161 mg/dL — ABNORMAL HIGH (ref 70–99)
Glucose-Capillary: 128 mg/dL — ABNORMAL HIGH (ref 70–99)
Glucose-Capillary: 162 mg/dL — ABNORMAL HIGH (ref 70–99)

## 2019-06-16 NOTE — Progress Notes (Signed)
Occupational Therapy Session Note  Patient Details  Name: [name] Mathews MRN: [id number] Date of Birth: 22-Apr-1934  Today's Date: 06/16/2019 OT Individual Time: 4098-1191 OT Individual Time Calculation (min): 69 min    Short Term Goals: Week 1:  OT Short Term Goal 1 (Week 1): Pt will complete bathing with max assist of one caregiver OT Short Term Goal 2 (Week 1): Pt will complete UB dressing with mod assist OT Short Term Goal 3 (Week 1): Pt will complete toilet transfer with max assist of one caregiver OT Short Term Goal 4 (Week 1): Pt will complete LB dressing with max assist of one caregiver OT Short Term Goal 5 (Week 1): Pt will don LSO with mod assist  Skilled Therapeutic Interventions/Progress Updates:    Pt received sitting up in bed with NT helping pt eat breakfast. Pt completed log rolling technique for bed mobility with mod cueing and mod A. Pt required mod cueing for positioning EOB to prepare for transfer. Max A to don brace EOB. Pt able to maintain EOB sitting balance with intermittent mod A for posterior lean, corrected with UE support on bed rail. Pt completed stand pivot transfer with max A. Pt completed oral hygiene at sink with mod cueing. Pt required frequent cueing throughout session for attention to task and keeping eyes open. Pt donned shirt with no physical assist! Back brace donned with max A. Pt stood at sink with max A for lifting assistance to doff brief, mod A. Pt reported she needed to use BSC. Pt completed stand pivot transfer to [facility] with mod A and voided urine. Pt able to complete anterior peri hygiene with mod cueing overall, min A for sit > stand and balance support. Pt required max A to don pants, unable to sequence using reacher for back precaution adherence. Pt transferred back to w/c with mod A. Pt was taken to dayroom for increasing arousal and orientation by sitting by open window with increased light. Pt was able to spontaneously keep eyes open for longer with  this stimuli and engage in conversation for several minutes. Pt was returned to room and requested to return to bed. Pt was left with all needs met, bed alarm set.   Therapy Documentation Precautions:  Precautions Precautions: Fall, Back Required Braces or Orthoses: Spinal Brace Spinal Brace: Lumbar corset, Applied in sitting position Restrictions Weight Bearing Restrictions: No   Therapy/Group: Individual Therapy  Curtis Sites 06/16/2019, 7:13 AM

## 2019-06-16 NOTE — Plan of Care (Addendum)
  Problem: SCI BOWEL ELIMINATION Goal: RH STG MANAGE BOWEL WITH ASSISTANCE Description: STG Manage Bowel with min Assistance. Outcome: Not Progressing; loose stools daughter requesting not to give any laxatives at this time   Problem: SCI BLADDER ELIMINATION Goal: RH STG MANAGE BLADDER WITH ASSISTANCE Description: STG Manage Bladder With min Assistance Outcome: Not Progressing; incontinence   Problem: RH SKIN INTEGRITY Goal: RH STG MAINTAIN SKIN INTEGRITY WITH ASSISTANCE Description: STG Maintain Skin Integrity With min Assistance. Outcome: Not Progressing; serosanguinous discharge noted during dressing changes MD aware.Daughter requesting pain patches at site instead of pain med.  Per MD patient cannot have pain patches at this time due to discharges.

## 2019-06-16 NOTE — Progress Notes (Signed)
[facility] PHYSICAL MEDICINE & REHABILITATION PROGRESS NOTE   Subjective/Complaints:  Per nursing, patient's daughter concerned about patient becoming confused with pain medication.  Patient with history of dementia.  Has been on oxycodone but has only received 1 dose in the last 3 days The patient denies any pain currently. ROS.  Denies CP, shortness of breath, nausea, vomiting, diarrhea.  Objective:   No results found. No results for input(s): WBC, HGB, HCT, PLT in the last 72 hours. No results for input(s): NA, K, CL, CO2, GLUCOSE, BUN, CREATININE, CALCIUM in the last 72 hours.  Intake/Output Summary (Last 24 hours) at 06/16/2019 1140 Last data filed at 06/16/2019 0830 Gross per 24 hour  Intake 720 ml  Output -  Net 720 ml     Physical Exam: Vital Signs Blood pressure (!) 114/49, pulse 75, temperature 98.2 F (36.8 C), resp. rate 18, height 5' 9.5" (1.765 m), weight 98.4 kg, SpO2 93 %. Constitutional: No distress . Vital signs reviewed. HENT: Normocephalic.  Atraumatic. Eyes: EOMI. No discharge. Cardiovascular: No JVD. Respiratory: Normal effort.  No stridor. GI: Non-distended. Skin: Warm and dry.  Intact. Psych: Normal mood.  Normal behavior. Musc: No edema.  No tenderness. Neurological: Alert Motor: 4-4+/5 throughout Skin: Warm and dry.   Psychiatric: Flat  Assessment/Plan: 1. Functional deficits secondary to L1-L3 laninotomy/PLIF which require 3+ hours per day of interdisciplinary therapy in a comprehensive inpatient rehab setting.  Physiatrist is providing close team supervision and 24 hour management of active medical problems listed below.  Physiatrist and rehab team continue to assess barriers to discharge/monitor patient progress toward functional and medical goals  Care Tool:  Bathing  Bathing activity did not occur: Refused Body parts bathed by patient: Right arm, Left arm, Chest, Face   Body parts bathed by helper: Abdomen, Front perineal area,  Buttocks, Right upper leg, Left upper leg, Right lower leg, Left lower leg     Bathing assist Assist Level: 2 Helpers     Upper Body Dressing/Undressing Upper body dressing   What is the patient wearing?: Bra, Pull over shirt, Orthosis Orthosis activity level: Performed by helper  Upper body assist Assist Level: Total Assistance - Patient < 25%    Lower Body Dressing/Undressing Lower body dressing      What is the patient wearing?: Pants, Incontinence brief     Lower body assist Assist for lower body dressing: Total Assistance - Patient < 25%     Toileting Toileting    Toileting assist Assist for toileting: Total Assistance - Patient < 25%     Transfers Chair/bed transfer  Transfers assist     Chair/bed transfer assist level: 2 Helpers     Locomotion Ambulation   Ambulation assist      Assist level: Moderate Assistance - Patient 50 - 74% Assistive device: Walker-rolling Max distance: 10'   Walk 10 feet activity   Assist     Assist level: Moderate Assistance - Patient - 50 - 74% Assistive device: Walker-rolling   Walk 50 feet activity   Assist Walk 50 feet with 2 turns activity did not occur: Safety/medical concerns         Walk 150 feet activity   Assist Walk 150 feet activity did not occur: Safety/medical concerns         Walk 10 feet on uneven surface  activity   Assist Walk 10 feet on uneven surfaces activity did not occur: Safety/medical concerns         Wheelchair  Assist Will patient use wheelchair at discharge?: Yes Type of Wheelchair: Manual    Wheelchair assist level: Dependent - Patient 0%      Wheelchair 50 feet with 2 turns activity    Assist    Wheelchair 50 feet with 2 turns activity did not occur: Safety/medical concerns(Per PT)           Blood pressure (!) 114/49, pulse 75, temperature 98.2 F (36.8 C), resp. rate 18, height 5' 9.5" (1.765 m), weight 98.4 kg, SpO2 93 %.  Medical  Problem List and Plan: 1.Decreased functional mobilitysecondary to L2-3 spondylolisthesis, L1-2 and 2-3 spinal stenosis compressing L2 and III nerve roots with lumbar radiculopathy. Status post L2-3 and L1-2 laminotomy foraminotomies posterior lumbar interbody fusion 06/05/2019. Continue CIR PT, OT -LSO when out of bed. 2. Antithrombotics: -DVT/anticoagulation:SCDs.   Vascular study negative for DVT on 8/11 -antiplatelet therapy: N/A 3. Pain Management:Flexeril 10 mg 3 times daily as needed, oxycodone 5 mg which the patient does not taking to any significant degree.  We may discontinue this.  We will continue acetaminophen on an as-needed basis since this does not have any cognitive side effects.  The patient has not been taking this either, no doses over the last 3 days.  4. Mood:Rizadyne16 mg daily -antipsychotic agents: N/A 5. Neuropsych: This patientiscapable of making decisions on her own behalf. 6. Skin/Wound Care:Routine skin checks 7. Fluids/Electrolytes/Nutrition:Routine in and outs  8. ID. Continue Keflex 500 mg twice daily x7 days for serosanguineous back drainage.  Continue dressing changes, cannot use Lidoderm patch because of this 9.Acute on chronic anemia. Continue iron supplement  Hemoglobin 8.1 on 8/11  Continue to monitor 10.Diabetes mellitus. Hemoglobin A1c 6.5. Glucophage 500 mg twice daily, Actos 30 mg daily.    CBG (last 3)  Recent Labs    06/15/19 1648 06/16/19 0626 06/16/19 1128  GLUCAP 200* 132* 161*  stable 8/16  11.Hypertension. Avapro 150 mg daily, HCTZ 12.5 mg daily, Norvasc 10 mg daily. Monitor with increased mobility   Vitals:   06/15/19 1946 06/16/19 0410  BP: 127/60 (!) 114/49  Pulse: 74 75  Resp: 18 18  Temp: 98.7 F (37.1 C) 98.2 F (36.8 C)  SpO2: 98% 93%  remains labile  12.CKD stage II. Creatinine baseline 1.25-1.57. Follow-up chemistries  Creatinine 1.46 on  8/11  Continue to monitor 13. Hyperlipidemia. Crestor 14. Constipation. Continue Linzess 8/12- large BM this AM-continue regimen 15. Dispo- home with daughter     LOS: 6 days A FACE TO FACE EVALUATION WAS PERFORMED  Erick Colacendrew E  06/16/2019, 11:40 AM

## 2019-06-16 NOTE — Progress Notes (Signed)
Physical Therapy Session Note  Patient Details  Name: [name] Mathews MRN: [id number] Date of Birth: 1934-10-07  Today's Date: 06/16/2019 PT Individual Time: 1401-1500 PT Individual Time Calculation (min): 59 min   Short Term Goals: Week 1:  PT Short Term Goal 1 (Week 1): pt will consistently perform sit <> stand transfers with max A PT Short Term Goal 2 (Week 1): pt will perform gait x 25' in controlled environment with mod A  Skilled Therapeutic Interventions/Progress Updates:    Pt seated in w/c upon PT arrival, agreeable to therapy tx and denies pain at rest. Pt transported to the gym in w/c. [place] focused on OOB activity tolerance, gait and balance. Pt ambulated x30 ft, x68 ft and x45 ft this session with RW and min-mod assist, pt requires mod-max assist for boost up to standing, during gait pt requires cues for upright posture and increased step length. Pt transferred to the therapy mat. While seated edge of mat pt worked on sitting balance while hitting beach ball back and forth x 2 trials with stand by assist for balance. Pt worked on standing balance and tolerance this session with single UE support on RW, x 1 trial while hitting beach ball back and forth, x 1 trial while tossing horseshoes. Pt performed stand pivot back to w/c with RW and mod assist, transported back to room. Pt requesting to get back in bed, stand pivot to bed with RW and mod assist and sit>supine with mod assist for LE management. Pt left supine in bed with needs in reach and bed alarm set. Daughter present.   Therapy Documentation Precautions:  Precautions Precautions: Fall, Back Required Braces or Orthoses: Spinal Brace Spinal Brace: Lumbar corset, Applied in sitting position Restrictions Weight Bearing Restrictions: No    Therapy/Group: Individual Therapy  Netta Corrigan, PT, DPT 06/16/2019, 12:10 PM

## 2019-06-17 ENCOUNTER — Inpatient Hospital Stay (HOSPITAL_COMMUNITY): Payer: Medicare Other | Admitting: Physical Therapy

## 2019-06-17 ENCOUNTER — Inpatient Hospital Stay (HOSPITAL_COMMUNITY): Payer: Medicare Other | Admitting: Occupational Therapy

## 2019-06-17 LAB — BASIC METABOLIC PANEL
Anion gap: 8 (ref 5–15)
BUN: 32 mg/dL — ABNORMAL HIGH (ref 8–23)
CO2: 19 mmol/L — ABNORMAL LOW (ref 22–32)
Calcium: 7 mg/dL — ABNORMAL LOW (ref 8.9–10.3)
Chloride: 114 mmol/L — ABNORMAL HIGH (ref 98–111)
Creatinine, Ser: 1.22 mg/dL — ABNORMAL HIGH (ref 0.44–1.00)
GFR calc Af Amer: 47 mL/min — ABNORMAL LOW (ref >60)
GFR calc non Af Amer: 41 mL/min — ABNORMAL LOW (ref >60)
Glucose, Bld: 103 mg/dL — ABNORMAL HIGH (ref 70–99)
Potassium: 4.7 mmol/L (ref 3.5–5.1)
Sodium: 141 mmol/L (ref 135–145)

## 2019-06-17 LAB — CBC
HCT: 24.8 % — ABNORMAL LOW (ref 36.0–46.0)
Hemoglobin: 8.1 g/dL — ABNORMAL LOW (ref 12.0–15.0)
MCH: 29.7 pg (ref 26.0–34.0)
MCHC: 32.7 g/dL (ref 30.0–36.0)
MCV: 90.8 fL (ref 80.0–100.0)
Platelets: 309 10*3/uL (ref 150–400)
RBC: 2.73 MIL/uL — ABNORMAL LOW (ref 3.87–5.11)
RDW: 14.2 % (ref 11.5–15.5)
WBC: 7.9 10*3/uL (ref 4.0–10.5)
nRBC: 0 % (ref 0.0–0.2)

## 2019-06-17 LAB — GLUCOSE, CAPILLARY
Glucose-Capillary: 136 mg/dL — ABNORMAL HIGH (ref 70–99)
Glucose-Capillary: 235 mg/dL — ABNORMAL HIGH (ref 70–99)
Glucose-Capillary: 163 mg/dL — ABNORMAL HIGH (ref 70–99)
Glucose-Capillary: 142 mg/dL — ABNORMAL HIGH (ref 70–99)

## 2019-06-17 NOTE — Progress Notes (Signed)
Physical Therapy Session Note  Patient Details  Name: [name] Mathews MRN: [id number] Date of Birth: 23-Mar-1934  Today's Date: 06/17/2019 PT Individual Time: 1020(10 min make-up time)-1115 PT Individual Time Calculation (min): 55 min   Short Term Goals: Week 1:  PT Short Term Goal 1 (Week 1): pt will consistently perform sit <> stand transfers with max A PT Short Term Goal 2 (Week 1): pt will perform gait x 25' in controlled environment with mod A  Skilled Therapeutic Interventions/Progress Updates:   Pt in supine and agreeable to therapy w/ encouragement. Denies pain at rest. Supine>sit w/ max assist and total assist to don LSO. Sit>stand to RW w/ max assist and multiple attempts. Pt would continue to scoot forward 2/2 posterior lean when attempting to stand. Max manual and verbal cues for anterior trunk lean during sit>stand transition. Mod-max assist stand pivot 2/2 manual assist needed for RW management during pivoting. Total assist w/c transport to therapy gym.   NuStep 4 min and 3 min @ level 1, LEs only for LE strengthening, endurance training, and facilitating a more fluid and reciprocal movement pattern. Max tactile and verbal cues throughout to attend to task, to increase to more functional speed, and to keep eyes open. Provided w/ water and ensure at rest breaks.   Attempted to walk from NuStep, however pt reporting she was voiding in brief. Returned to room total assist in w/c and ambulated to/from toilet w/ mod assist for RW management, especially at turns. Total assist for brief and LE garment management. Pt incontinent of void in brief. Pt doing better job at keeping upright posture in static stance while caregivers changed brief, however still needs occasional cues. Ambulated from toilet into hallway x40' w/ min assist. Seated rest break in w/c prior to heading back to room. Pt finished entire ensure drink w/ encouragement.   Ambulated back to room x40' w/ RW, pt reported she  needed to void again. Max assist toilet transfer w/ RW, pt incontinent again of small void. Ended session in supine, in care of daughter and all needs met. Mod-max assist for all sit<>stands this session 2/2 boosting to stance and skilled cues needed for anterior trunk lean and pushing up on LEs. Overall, pt more alert and engaging with this therapist, however continues to needs cues to stay awake and to open eyes while performing functional tasks.   Therapy Documentation Precautions:  Precautions Precautions: Fall, Back Required Braces or Orthoses: Spinal Brace Spinal Brace: Lumbar corset, Applied in sitting position Restrictions Weight Bearing Restrictions: No  Therapy/Group: Individual Therapy  Harshal Sirmon Clent Demark 06/17/2019, 11:33 AM

## 2019-06-17 NOTE — Progress Notes (Signed)
Occupational Therapy Session Note  Patient Details  Name: [name] Mathews MRN: [id number] Date of Birth: 1934/08/07  Today's Date: 06/17/2019 OT Individual Time: 5462-7035 OT Individual Time Calculation (min): 45 min    Short Term Goals: Week 1:  OT Short Term Goal 1 (Week 1): Pt will complete bathing with max assist of one caregiver OT Short Term Goal 2 (Week 1): Pt will complete UB dressing with mod assist OT Short Term Goal 3 (Week 1): Pt will complete toilet transfer with max assist of one caregiver OT Short Term Goal 4 (Week 1): Pt will complete LB dressing with max assist of one caregiver OT Short Term Goal 5 (Week 1): Pt will don LSO with mod assist  Skilled Therapeutic Interventions/Progress Updates:    Treatment session with focus on arousal and ability to participate in self-care tasks.  Pt received supine in bed reporting fatigue and attempting to decline therapy.  [name] called at beginning of session and discussed with therapist and pt her concerns and encouraged pt to participate in treatment session.  Pt agreeable to dressing and then returning to bed.  Completed bed mobility with mod assist and donned shirt with Min assist to initiate but then able to complete with only cuing.  Min assist to close supervision for sitting balance, largely due to decreased attention/arousal.  Sit > Stand in [place] with Max assist for weight shift to come in to standing.  Once standing, pt reports need to toilet.  Transferred to toilet on [place], pt incontinent of urine prior to toileting with no void of bowel or bladder while on toilet.  Completed hygiene at sit > stand in [place] with therapist completing all hygiene and clothing.  Pt returned to bed and back to sidelying with mod assist. Pt left in sidelying with all needs in reach.  Therapy Documentation Precautions:  Precautions Precautions: Fall, Back Required Braces or Orthoses: Spinal Brace Spinal Brace: Lumbar corset, Applied in  sitting position Restrictions Weight Bearing Restrictions: No General:   Vital Signs: Therapy Vitals Temp: 98.7 F (37.1 C) Temp Source: Oral Pulse Rate: 74 BP: (!) 133/56 Patient Position (if appropriate): Lying Oxygen Therapy SpO2: 96 % O2 Device: Room Air Pain:   ADL: ADL ADL Comments: Total assist for all due to lethargy and decreased ability to follow commands Vision   Perception    Praxis   Exercises:   Other Treatments:     Therapy/Group: Individual Therapy  Simonne Come 06/17/2019, 9:11 AM

## 2019-06-17 NOTE — Progress Notes (Signed)
Spoke with daughter this morning with concern of loose stools felt to be induced by Glucophage.  She requested this be held for a couple of days and resume as needed as she had had difficulty with this in the past.  Closely monitor creatinine and follow-up labs as needed.  All issues discussed at length with patient and daughter

## 2019-06-17 NOTE — Progress Notes (Signed)
Occupational Therapy Session Note  Patient Details  Name: [name] Mathews MRN: [id number] Date of Birth: 1934/01/27  Today's Date: 06/17/2019 OT Individual Time: 1405-1450 OT Individual Time Calculation (min): 45 min    Short Term Goals: Week 1:  OT Short Term Goal 1 (Week 1): Pt will complete bathing with max assist of one caregiver OT Short Term Goal 2 (Week 1): Pt will complete UB dressing with mod assist OT Short Term Goal 3 (Week 1): Pt will complete toilet transfer with max assist of one caregiver OT Short Term Goal 4 (Week 1): Pt will complete LB dressing with max assist of one caregiver OT Short Term Goal 5 (Week 1): Pt will don LSO with mod assist  Skilled Therapeutic Interventions/Progress Updates:    Treatment session with focus on initiation and activity tolerance during self-care tasks and functional mobility.  Pt received supine in bed, however easily aroused.  Engaged in [place] with reacher with cues for sequencing and problem solving, ultimately still requiring max-total assist for LB dressing. Upon sit> stand for LB clothing management, pt reports need to toilet.  Ambulated to toilet with RW with max assist sit> stand and min assist for ambulation once upright.  Engaged in multiple sit > stand from toilet due to loose stools.  Utilized +2 with therapist providing multimodal cues for upright posture while daughter assisted with hygiene and clothing management.  Pt ambulated to recliner with RW with mod-max assist and management of RW due to fatigue and difficulty maintaining eyes open.  Pt remained upright in recliner with daughter present.  Therapy Documentation Precautions:  Precautions Precautions: Fall, Back Required Braces or Orthoses: Spinal Brace Spinal Brace: Lumbar corset, Applied in sitting position Restrictions Weight Bearing Restrictions: No General:   Vital Signs: Therapy Vitals Temp: 98.8 F (37.1 C) Pulse Rate: 76 Resp: 18 BP: (!) 111/47 Patient  Position (if appropriate): Sitting Oxygen Therapy SpO2: 97 % O2 Device: Room Air Pain: Pt with no c/o pain  Therapy/Group: Individual Therapy  Simonne Come 06/17/2019, 4:11 PM

## 2019-06-17 NOTE — Progress Notes (Signed)
[facility] PHYSICAL MEDICINE & REHABILITATION PROGRESS NOTE   Subjective/Complaints:  Pt and nursing tech reports pt's pain has been doing well this AM- mild if that at rest- appears to be in discomfort when rolling and with trying to flex lumbar spine.  Otherwise, pt reports sleeping well-  Not clear when LBM- thinks yesterday.  . ROS.  Denies CP, shortness of breath, nausea, vomiting, diarrhea.  Objective:   No results found. No results for input(s): WBC, HGB, HCT, PLT in the last 72 hours. Recent Labs    06/17/19 0928  NA 141  K 4.7  CL 114*  CO2 19*  GLUCOSE 103*  BUN 32*  CREATININE 1.22*  CALCIUM 7.0*    Intake/Output Summary (Last 24 hours) at 06/17/2019 1038 Last data filed at 06/17/2019 0930 Gross per 24 hour  Intake 326 ml  Output -  Net 326 ml     Physical Exam: Vital Signs Blood pressure (!) 133/56, pulse 74, temperature 98.7 F (37.1 C), temperature source Oral, resp. rate 16, height 5' 9.5" (1.765 m), weight 98.4 kg, SpO2 96 %. Constitutional: No distress . Vital signs and labs reviewed. Sitting up in bed, feeding herself with NT at bedside, slow but much faster than last week HENT: Normocephalic.  Atraumatic. Eyes: EOMI. No discharge. Cardiovascular: RRR Respiratory: CTA B/L GI: Non-distended. Soft, NT, (+)BS Skin: Warm and dry.  Intact. Psych: Normal mood.  Normal behavior. Musc: No edema.  No tenderness. Neurological: Alert Motor: 4-4+/5 throughout Skin: Warm and dry- exam of lumbar spine incision- no erythema, tiny bit of serous drainage- otherwise, trace edema. Marland Kitchen.   Psychiatric: Flat, quiet- only answers direct questions- no spontaneous repsonses  Assessment/Plan: 1. Functional deficits secondary to L1-L3 laninotomy/PLIF which require 3+ hours per day of interdisciplinary therapy in a comprehensive inpatient rehab setting.  Physiatrist is providing close team supervision and 24 hour management of active medical problems listed  below.  Physiatrist and rehab team continue to assess barriers to discharge/monitor patient progress toward functional and medical goals  Care Tool:  Bathing  Bathing activity did not occur: Refused Body parts bathed by patient: Right arm, Left arm, Chest, Face, Abdomen, Front perineal area   Body parts bathed by helper: Buttocks, Right upper leg, Left upper leg, Right lower leg, Left lower leg     Bathing assist Assist Level: Moderate Assistance - Patient 50 - 74%     Upper Body Dressing/Undressing Upper body dressing   What is the patient wearing?: Pull over shirt, Orthosis Orthosis activity level: Performed by helper  Upper body assist Assist Level: Minimal Assistance - Patient > 75%    Lower Body Dressing/Undressing Lower body dressing      What is the patient wearing?: Pants, Incontinence brief     Lower body assist Assist for lower body dressing: Total Assistance - Patient < 25%     Toileting Toileting    Toileting assist Assist for toileting: Dependent - Patient 0%     Transfers Chair/bed transfer  Transfers assist     Chair/bed transfer assist level: Moderate Assistance - Patient 50 - 74%     Locomotion Ambulation   Ambulation assist      Assist level: Moderate Assistance - Patient 50 - 74% Assistive device: Walker-rolling Max distance: 68 ft   Walk 10 feet activity   Assist     Assist level: Moderate Assistance - Patient - 50 - 74% Assistive device: Walker-rolling   Walk 50 feet activity   Assist Walk 50 feet  with 2 turns activity did not occur: Safety/medical concerns  Assist level: Moderate Assistance - Patient - 50 - 74% Assistive device: Walker-rolling    Walk 150 feet activity   Assist Walk 150 feet activity did not occur: Safety/medical concerns         Walk 10 feet on uneven surface  activity   Assist Walk 10 feet on uneven surfaces activity did not occur: Safety/medical concerns          Wheelchair     Assist Will patient use wheelchair at discharge?: Yes Type of Wheelchair: Manual    Wheelchair assist level: Dependent - Patient 0%      Wheelchair 50 feet with 2 turns activity    Assist    Wheelchair 50 feet with 2 turns activity did not occur: Safety/medical concerns(Per PT)           Blood pressure (!) 133/56, pulse 74, temperature 98.7 F (37.1 C), temperature source Oral, resp. rate 16, height 5' 9.5" (1.765 m), weight 98.4 kg, SpO2 96 %.  Medical Problem List and Plan: 1.Decreased functional mobilitysecondary to L2-3 spondylolisthesis, L1-2 and 2-3 spinal stenosis compressing L2 and III nerve roots with lumbar radiculopathy. Status post L2-3 and L1-2 laminotomy foraminotomies posterior lumbar interbody fusion 06/05/2019. Continue CIR PT, OT -LSO when out of bed. 2. Antithrombotics: -DVT/anticoagulation:SCDs.   Vascular study negative for DVT on 8/11 -antiplatelet therapy: N/A 3. Pain Management:Flexeril 10 mg 3 times daily as needed, oxycodone 5 mg which the patient does not taking to any significant degree.  We may discontinue this.  We will continue acetaminophen on an as-needed basis since this does not have any cognitive side effects.  The patient has not been taking this either, no doses over the last 3 days.   8/17- off oxycodone and on tylenol and Flexeril prn only. 4. Mood:Rizadyne16 mg daily -antipsychotic agents: N/A 5. Neuropsych: This patientiscapable of making decisions on her own behalf. 6. Skin/Wound Care:Routine skin checks  8/17- lumbar incision healing well 7. Fluids/Electrolytes/Nutrition:Routine in and outs  8. ID. Continue Keflex 500 mg twice daily x7 days for serosanguineous back drainage.  Continue dressing changes, cannot use Lidoderm patch because of this 9.Acute on chronic anemia. Continue iron supplement  Hemoglobin 8.1 on 8/11  Continue to  monitor 10.Diabetes mellitus. Hemoglobin A1c 6.5. Glucophage 500 mg twice daily, Actos 30 mg daily.    CBG (last 3)  Recent Labs    06/16/19 1642 06/16/19 2057 06/17/19 0614  GLUCAP 128* 162* 136*  stable 8/16  11.Hypertension. Avapro 150 mg daily, HCTZ 12.5 mg daily, Norvasc 10 mg daily. Monitor with increased mobility   Vitals:   06/16/19 2010 06/17/19 0557  BP: (!) 132/51 (!) 133/56  Pulse: 75 74  Resp: 16   Temp: 98.5 F (36.9 C) 98.7 F (37.1 C)  SpO2: 100% 96%  remains labile  12.CKD stage II. Creatinine baseline 1.25-1.57. Follow-up chemistries  Creatinine 1.46 on 8/11  8/17- Cr 1.22- BUN 32- a little dry, but doing better overall  Continue to monitor 13. Hyperlipidemia. Crestor 14. Constipation. Continue Linzess 8/12- large BM this AM-continue regimen  8/17- LBM yesterday 15. Dispo- home with daughter     LOS: 7 days A FACE TO FACE EVALUATION WAS PERFORMED  [name] Mathews 06/17/2019, 10:38 AM

## 2019-06-18 ENCOUNTER — Inpatient Hospital Stay (HOSPITAL_COMMUNITY): Payer: Medicare Other | Admitting: Occupational Therapy

## 2019-06-18 ENCOUNTER — Inpatient Hospital Stay (HOSPITAL_COMMUNITY): Payer: Medicare Other

## 2019-06-18 DIAGNOSIS — R410 Disorientation, unspecified: Secondary | ICD-10-CM

## 2019-06-18 LAB — GLUCOSE, CAPILLARY
Glucose-Capillary: 123 mg/dL — ABNORMAL HIGH (ref 70–99)
Glucose-Capillary: 185 mg/dL — ABNORMAL HIGH (ref 70–99)
Glucose-Capillary: 148 mg/dL — ABNORMAL HIGH (ref 70–99)
Glucose-Capillary: 200 mg/dL — ABNORMAL HIGH (ref 70–99)

## 2019-06-18 NOTE — Progress Notes (Signed)
Occupational Therapy Session Note  Patient Details  Name: [name] Mathews MRN: [id number] Date of Birth: 04/17/1934  Today's Date: 06/18/2019 OT Individual Time: 1410-1450 OT Individual Time Calculation (min): 40 min    Short Term Goals: Week 2:  OT Short Term Goal 1 (Week 2): Pt will complete LB dressing with max assist of one caregiver OT Short Term Goal 2 (Week 2): Pt will don LSO with mod assist OT Short Term Goal 3 (Week 2): Pt will complete sit > stand for LB dressing with mod assist consistently OT Short Term Goal 4 (Week 2): Pt will complete toilet transfer with mod assist  Skilled Therapeutic Interventions/Progress Updates:    Treatment session with focus on arousal and functional mobility.  Pt received asleep in w/c, requiring increased time to fully arouse.  Pt requesting to return to bed, however agreeable to functional mobility and transfers prior to returning to bed.  Therapist asked if pt had toileted recently to which pt stated that she had not.  Placed BSC next to toilet due to urgency in standing.  Pt completed sit > stand with mod cues for hand placement and mod assist for lifting.  Once upright, pt reporting need to toilet.  Completed stand pivot transfer to [facility] with pt soiling brief, but able to complete void on BSC.  Engaged in sit > stand with mod assist from [facility] and pt participated in pulling pants up, ultimately still requiring max assist.  Ambulated to bed with RW with min assist and assistance to manage RW due to fatigue.  Discussed donning/doffing LSO with pt requiring assistance to unfasten velcro to doff LSO.  Completed bed mobility with mod assist to return to supine.  Pt left semi-reclined in bed with all needs in reach.    Therapy Documentation Precautions:  Precautions Precautions: Fall, Back Required Braces or Orthoses: Spinal Brace Spinal Brace: Lumbar corset, Applied in sitting position Restrictions Weight Bearing Restrictions: No General:   Vital  Signs: Therapy Vitals Temp: 97.9 F (36.6 C) Temp Source: Oral Pulse Rate: 64 Resp: 18 BP: (!) 112/50 Patient Position (if appropriate): Sitting Oxygen Therapy SpO2: 100 % O2 Device: Room Air Pain:  Pt with no c/o pain   Therapy/Group: Individual Therapy  Simonne Come 06/18/2019, 3:07 PM

## 2019-06-18 NOTE — Progress Notes (Signed)
[place] PHYSICAL MEDICINE & REHABILITATION PROGRESS NOTE   Subjective/Complaints:  Pt reports "here"- just woke up by Nursing techs- ate 1/2 of breakfast and lunch and all dinner yesterday- has to be fed and they work to get her to eat.  LBM yesterday- x2- says in her mind, she's having too many. . ROS.  Denies CP, shortness of breath, nausea, vomiting, diarrhea.  Objective:   No results found. Recent Labs    06/17/19 1005  WBC 7.9  HGB 8.1*  HCT 24.8*  PLT 309   Recent Labs    06/17/19 0928  NA 141  K 4.7  CL 114*  CO2 19*  GLUCOSE 103*  BUN 32*  CREATININE 1.22*  CALCIUM 7.0*    Intake/Output Summary (Last 24 hours) at 06/18/2019 1405 Last data filed at 06/18/2019 1300 Gross per 24 hour  Intake 1260 ml  Output -  Net 1260 ml     Physical Exam: Vital Signs Blood pressure (!) 112/50, pulse 64, temperature 97.9 F (36.6 C), resp. rate 18, height 5' 9.5" (1.765 m), weight 98.4 kg, SpO2 100 %. Constitutional: No distress . Vital signs and labs reviewed. Just woke up, lying in bed, sleepy, appears comfortable, NAD HENT: Normocephalic.  Atraumatic. Eyes: EOMI. No discharge. Cardiovascular: RRR Respiratory: CTA B/L GI: Non-distended. Soft, NT, (+)BS Skin: Warm and dry.  Intact. Incision looks good on lumbar spine- no erythema, mild edema, minimal drainage Psych: vague, Ox2, slow to process, needs feeding due to lack of initiation Musc: No edema.  No tenderness. Neurological: Alert Motor: 4-4+/5 throughout Skin: Warm and dry- exam of lumbar spine incision- no erythema, tiny bit of serous drainage- otherwise, trace edema. Marland Kitchen.   Psychiatric: Flat, quiet- only answers direct questions- no spontaneous repsonses  Assessment/Plan: 1. Functional deficits secondary to L1-L3 laninotomy/PLIF which require 3+ hours per day of interdisciplinary therapy in a comprehensive inpatient rehab setting.  Physiatrist is providing close team supervision and 24 hour management of  active medical problems listed below.  Physiatrist and rehab team continue to assess barriers to discharge/monitor patient progress toward functional and medical goals  Care Tool:  Bathing  Bathing activity did not occur: Refused Body parts bathed by patient: Right arm, Left arm, Chest, Face, Abdomen, Front perineal area   Body parts bathed by helper: Buttocks, Right upper leg, Left upper leg, Right lower leg, Left lower leg     Bathing assist Assist Level: 2 Helpers     Upper Body Dressing/Undressing Upper body dressing   What is the patient wearing?: Pull over shirt, Orthosis Orthosis activity level: Performed by helper  Upper body assist Assist Level: Minimal Assistance - Patient > 75%    Lower Body Dressing/Undressing Lower body dressing      What is the patient wearing?: Pants, Incontinence brief     Lower body assist Assist for lower body dressing: Total Assistance - Patient < 25%     Toileting Toileting    Toileting assist Assist for toileting: Maximal Assistance - Patient 25 - 49%     Transfers Chair/bed transfer  Transfers assist     Chair/bed transfer assist level: Minimal Assistance - Patient > 75%     Locomotion Ambulation   Ambulation assist      Assist level: Minimal Assistance - Patient > 75% Assistive device: Walker-rolling Max distance: 15'   Walk 10 feet activity   Assist     Assist level: Minimal Assistance - Patient > 75% Assistive device: Walker-rolling, Orthosis   Walk 50  feet activity   Assist Walk 50 feet with 2 turns activity did not occur: Safety/medical concerns  Assist level: Moderate Assistance - Patient - 50 - 74% Assistive device: Walker-rolling    Walk 150 feet activity   Assist Walk 150 feet activity did not occur: Safety/medical concerns         Walk 10 feet on uneven surface  activity   Assist Walk 10 feet on uneven surfaces activity did not occur: Safety/medical concerns          Wheelchair     Assist Will patient use wheelchair at discharge?: Yes Type of Wheelchair: Manual    Wheelchair assist level: Dependent - Patient 0%      Wheelchair 50 feet with 2 turns activity    Assist    Wheelchair 50 feet with 2 turns activity did not occur: Safety/medical concerns(Per PT)           Blood pressure (!) 112/50, pulse 64, temperature 97.9 F (36.6 C), resp. rate 18, height 5' 9.5" (1.765 m), weight 98.4 kg, SpO2 100 %.  Medical Problem List and Plan: 1.Decreased functional mobilitysecondary to L2-3 spondylolisthesis, L1-2 and 2-3 spinal stenosis compressing L2 and III nerve roots with lumbar radiculopathy. Status post L2-3 and L1-2 laminotomy foraminotomies posterior lumbar interbody fusion 06/05/2019. Continue CIR PT, OT -LSO when out of bed. 2. Antithrombotics: -DVT/anticoagulation:SCDs.   Vascular study negative for DVT on 8/11 -antiplatelet therapy: N/A 3. Pain Management:Flexeril 10 mg 3 times daily as needed, oxycodone 5 mg which the patient does not taking to any significant degree.  We may discontinue this.  We will continue acetaminophen on an as-needed basis since this does not have any cognitive side effects.  The patient has not been taking this either, no doses over the last 3 days.   8/17- off oxycodone and on tylenol and Flexeril prn only.  8/18- mainly taking tylenol  4. Mood:Rizadyne16 mg daily -antipsychotic agents: N/A 5. Neuropsych: This patientiscapable of making decisions on her own behalf. 6. Skin/Wound Care:Routine skin checks  8/17- lumbar incision healing well 7. Fluids/Electrolytes/Nutrition:Routine in and outs  8. ID. Continue Keflex 500 mg twice daily x7 days for serosanguineous back drainage.  Continue dressing changes, cannot use Lidoderm patch because of this 9.Acute on chronic anemia. Continue iron supplement  Hemoglobin 8.1 on 8/11  Continue to  monitor 10.Diabetes mellitus. Hemoglobin A1c 6.5. Glucophage 500 mg twice daily, Actos 30 mg daily.   8/18- held metformin- daughter feels diarrhea/loose stools secondary to metformin, not all the bowel meds we had to give her to get her going- will hold x 2 days, per her request.   CBG (last 3)  Recent Labs    06/17/19 2059 06/18/19 0652 06/18/19 1206  GLUCAP 142* 123* 185*  stable 8/16  11.Hypertension. Avapro 150 mg daily, HCTZ 12.5 mg daily, Norvasc 10 mg daily. Monitor with increased mobility   Vitals:   06/18/19 0527 06/18/19 1402  BP: 137/67 (!) 112/50  Pulse: 66 64  Resp: 18 18  Temp: 98 F (36.7 C) 97.9 F (36.6 C)  SpO2: 97% 100%  remains labile  12.CKD stage II. Creatinine baseline 1.25-1.57. Follow-up chemistries  Creatinine 1.46 on 8/11  8/17- Cr 1.22- BUN 32- a little dry, but doing better overall  Continue to monitor 13. Hyperlipidemia. Crestor 14. Constipation. Continue Linzess 8/12- large BM this AM-continue regimen  8/17- LBM yesterday  8/18- 2 BMs yesterday- daughter concerned too many BMs. No significant Bowel meds to decrease currently 15. Dispo-  home with daughter     LOS: 8 days A FACE TO FACE EVALUATION WAS PERFORMED  Braxston Quinter 06/18/2019, 2:05 PM

## 2019-06-18 NOTE — Progress Notes (Signed)
Occupational Therapy Weekly Progress Note  Patient Details  Name: [name] Mathews MRN: [id number] Date of Birth: 1934-08-25  Beginning of progress report period: June 11, 2019 End of progress report period: June 18, 2019  Today's Date: 06/18/2019 OT Individual Time: 9449-6759 OT Individual Time Calculation (min): 45 min    Patient has met 3 of 5 short term goals.  Pt is making slow progress towards goals, mostly impacted by fatigue and decreased arousal limiting her participation in treatment sessions.  Pt schedule has been modified to 15/7 to allow for increased participation during therapy sessions and allow for rest breaks during day.  Pt is able to complete sit > stand with mod-max assist depending on level of fatigue/arousal.  Once upright, pt typically is able to complete basic mobility with min-mod assist, however when fatigued pt requires increased cues and physical assistance for safety during mobility.  Pt is demonstrating increased initiation during self-care tasks, still requiring max-total assist with LB dressing.    Patient continues to demonstrate the following deficits: muscle weakness, impaired timing and sequencing and unbalanced muscle activation, decreased attention, decreased awareness, decreased safety awareness, decreased memory and delayed processing and decreased standing balance and decreased balance strategies and therefore will continue to benefit from skilled OT intervention to enhance overall performance with BADL and Reduce care partner burden.  Patient progressing toward long term goals..  Continue plan of care.  OT Short Term Goals Week 1:  OT Short Term Goal 1 (Week 1): Pt will complete bathing with max assist of one caregiver OT Short Term Goal 1 - Progress (Week 1): Met OT Short Term Goal 2 (Week 1): Pt will complete UB dressing with mod assist OT Short Term Goal 2 - Progress (Week 1): Met OT Short Term Goal 3 (Week 1): Pt will complete toilet transfer  with max assist of one caregiver OT Short Term Goal 3 - Progress (Week 1): Met OT Short Term Goal 4 (Week 1): Pt will complete LB dressing with max assist of one caregiver OT Short Term Goal 4 - Progress (Week 1): Progressing toward goal OT Short Term Goal 5 (Week 1): Pt will don LSO with mod assist OT Short Term Goal 5 - Progress (Week 1): Progressing toward goal Week 2:  OT Short Term Goal 1 (Week 2): Pt will complete LB dressing with max assist of one caregiver OT Short Term Goal 2 (Week 2): Pt will don LSO with mod assist OT Short Term Goal 3 (Week 2): Pt will complete sit > stand for LB dressing with mod assist consistently OT Short Term Goal 4 (Week 2): Pt will complete toilet transfer with mod assist  Skilled Therapeutic Interventions/Progress Updates:    Treatment session with focus on arousal and increased participation during self-care tasks.  Pt received supine in bed agreeable to bathing/dressing this session.  Pt completed bed mobility with min assist with increased time and cues for sequencing.  Engaged in bathing seated at EOB with focus on arousal and participation, with pt requiring intermittent min assist for sitting balance due to tendency to lean backwards due to fatigue and weakness.  Pt donned shirt with setup assist and assisted with donning LSO with max cues and total assist.  Reiterated use of reacher for LB dressing with pt refusing to utilize this session, therefore therapist thread feet through pants and pt able to pull up over knees and on to thighs.  Max assist sit > stand for therapist to pull pants over hips.  Upon standing, pt reports need to toilet therefore completed stand pivot to [facility] with min assist once upright.  Pt incontinent of urine; +2 for clothing management and hygiene.  Pt completed stand pivot transfer to recliner with max assist sit > stand and min-mod for transfer once upright.  Pt left semi-reclined in recliner with all needs in reach and seat belt alarm  on.  Therapy Documentation Precautions:  Precautions Precautions: Fall, Back Required Braces or Orthoses: Spinal Brace Spinal Brace: Lumbar corset, Applied in sitting position Restrictions Weight Bearing Restrictions: No Pain: Pain Assessment Pain Scale: 0-10 Pain Score: 0-No pain   Therapy/Group: Individual Therapy  Simonne Come 06/18/2019, 12:24 PM

## 2019-06-18 NOTE — Progress Notes (Signed)
Physical Therapy Session Note  Patient Details  Name: [name] Mathews MRN: [id number] Date of Birth: 04-Nov-1933  Today's Date: 06/18/2019 PT Individual Time: 1030-1115 PT Individual Time Calculation (min): 45 min   Short Term Goals: Week 1:  PT Short Term Goal 1 (Week 1): pt will consistently perform sit <> stand transfers with max A PT Short Term Goal 2 (Week 1): pt will perform gait x 25' in controlled environment with mod A  Skilled Therapeutic Interventions/Progress Updates:    Session focused on functional transfers including recliner <> w/c, toilet transfers and car transfer to SUV height. Pt requires significant amount of time and encouragement to complete tasks requiring cues for hand placement each time, anterior weightshift, and to initiate activity. Pt overall required min assist. Mod assist needed with car transfer with cues for adherence to back precautions. Pt able to perform short distance gait with RW with overall min assist and verbal cues for positioning of RW and safety. Pt was unable to void on toilet. Required max encouragement and cues for sequencing for clothing management. Discussed home entry and pt reports having stairs. Pt able to perform 3" step x 2 reps with min assist and verbal cues for hand placement. Progressed to 6" step with bilateral rails with min assist x 1 step (limited due to time). Will need to revisit in next PT session and clarify with daughter about railings or not as pt unable to recall actual set-up (just states 4 steps).   Therapy Documentation Precautions:  Precautions Precautions: Fall, Back Required Braces or Orthoses: Spinal Brace Spinal Brace: Lumbar corset, Applied in sitting position Restrictions Weight Bearing Restrictions: No    Pain: No complaints of pain.    Therapy/Group: Individual Therapy  [name], PT, DPT, CBIS  06/18/2019, 12:34 PM

## 2019-06-19 ENCOUNTER — Inpatient Hospital Stay (HOSPITAL_COMMUNITY): Payer: Medicare Other | Admitting: Physical Therapy

## 2019-06-19 ENCOUNTER — Inpatient Hospital Stay (HOSPITAL_COMMUNITY): Payer: Medicare Other | Admitting: Occupational Therapy

## 2019-06-19 LAB — GLUCOSE, CAPILLARY
Glucose-Capillary: 135 mg/dL — ABNORMAL HIGH (ref 70–99)
Glucose-Capillary: 193 mg/dL — ABNORMAL HIGH (ref 70–99)
Glucose-Capillary: 155 mg/dL — ABNORMAL HIGH (ref 70–99)
Glucose-Capillary: 113 mg/dL — ABNORMAL HIGH (ref 70–99)

## 2019-06-19 NOTE — Progress Notes (Signed)
Occupational Therapy Session Note  Patient Details  Name: [name] Mathews MRN: [id number] Date of Birth: [birth year]  Today's Date: 06/19/2019 OT Individual Time: 0100-7121 OT Individual Time Calculation (min): 45 min    Short Term Goals: Week 2:  OT Short Term Goal 1 (Week 2): Pt will complete LB dressing with max assist of one caregiver OT Short Term Goal 2 (Week 2): Pt will don LSO with mod assist OT Short Term Goal 3 (Week 2): Pt will complete sit > stand for LB dressing with mod assist consistently OT Short Term Goal 4 (Week 2): Pt will complete toilet transfer with mod assist  Skilled Therapeutic Interventions/Progress Updates:    Treatment session with focus on increased participation in self-care tasks.  Pt received supine in bed, agreeable to therapy session.  Engaged in bed mobility with focus on sequencing and increased participation. Pt required max multimodal cues and mod assist for bed mobility.  Engaged in bathing seated at EOB with intermittent min assist for sitting balance, however decreased episodes of leaning back compared to yesterday's session.  Pt completed UB dressing with setup assist and total assist when donning LSO.  Pt continues to require max assist for LB dressing due to decreased initiation and sequencing and no carryover of use of AE for LB dressing.  Therapist placed BSC next to toilet prior to standing for LB dressing as pt typically voids once standing.  Max assist sit > stand from EOB and once upright pt reports need to toilet.  Stand pivot with RW with min assist, total assist for clothing management due to urgency.  Pt completed sit > stand from [facility] (due to higher surface and sturdy arm rests) with min assist.  Maintained standing with CGA while therapist completed clothing management.  Min assist stand pivot transfer to recliner.  Pt left in recliner with seat belt alarm on and all needs in reach.  Therapy Documentation Precautions:  Precautions Precautions:  Fall, Back Required Braces or Orthoses: Spinal Brace Spinal Brace: Lumbar corset, Applied in sitting position Restrictions Weight Bearing Restrictions: No Pain:  Pt with no c/o pain   Therapy/Group: Individual Therapy  Simonne Come 06/19/2019, 9:42 AM

## 2019-06-19 NOTE — Progress Notes (Addendum)
Physical Therapy Weekly Progress Note  Patient Details  Name: [name] Mathews MRN: [id number] Date of Birth: [birth year]  Beginning of progress report period: June 11, 2019 End of progress report period: June 19, 2019  Today's Date: 06/19/2019 PT Individual Time: 4098-1191 AND 1420-1500 PT Individual Time Calculation (min): 38 min AND 40 min  Patient has met 2 of 2 short term goals. Pt has made slow but steady progress towards LTGs. She is consistently performing bed mobility and sit<>stand transfers w/ max assist x1 and gait w/ min assist using RW, however continues to need max multimodal cues for technique, attention, sequencing, and for environmental awareness. She is ambulating up to 82' but needs frequent rest breaks and encouragement to participate. Her daughter has been present for majority of therapy sessions and participates in hands-on care when appropriate. Poor initiation and lack of functional endurance/strength remain her biggest limiting factors.   Patient continues to demonstrate the following deficits muscle weakness, decreased cardiorespiratoy endurance, impaired timing and sequencing and decreased motor planning, decreased initiation, decreased attention, decreased awareness, decreased problem solving, decreased safety awareness, decreased memory and delayed processing and decreased sitting balance, decreased standing balance, decreased postural control and decreased balance strategies and therefore will continue to benefit from skilled PT intervention to increase functional independence with mobility.  Patient progressing toward long term goals..  Continue plan of care.  PT Short Term Goals Week 1:  PT Short Term Goal 1 (Week 1): Pt will consistently perform sit<>stand transfers w/ max assist PT Short Term Goal 1 - Progress (Week 1): Met PT Short Term Goal 2 (Week 1): Pt will perform gait x25' in controlled environment with mod assist PT Short Term Goal 2 - Progress (Week  1): Met Week 2:  PT Short Term Goal 1 (Week 2): Pt will perform sit>stands w/ mod assist consistently PT Short Term Goal 2 (Week 2): Pt will perform bed mobility w/ mod assist PT Short Term Goal 3 (Week 2): Pt will initiate functional tasks w/ mod verbal/tactile cues 50% of the time PT Short Term Goal 4 (Week 2): Pt will initiate stair training  Skilled Therapeutic Interventions/Progress Updates:   Session 1:  Pt in recliner and agreeable to therapy w/ encouragement, no c/o pain throughout session. Sit>stand w/ max assist and max assist to stabilize in stance, pt w/ posterior lean bias and needing manual and verbal cues to weight shift forward onto feet. Ambulated ~75' towards therapy gym w/ min assist using RW and frequent verbal and tactile cues for upright posture and to bring RW closer. Worked on blocked practice of sit>stands in therapy gym from w/c. Sit>stand x4 reps w/ max fading to mod assist w/ repetition. Verbal and tactile cues for hand placement, manual assist for anterior trunk lean/weight shifting and to boost. Provided w/ ensure drink in between stands w/ encouragement to drink for energy. Total assist w/c transport back to room. Mod assist stand pivot to EOB. Sit>supine w/ max assist. Ended session in supine, all needs in reach.   Session 2:  Pt in supine and agreeable to therapy w/ encouragement, no c/o pain. Supine>sit w/ mod assist. Max assist sit>stand from bed surface and mod assist stand pivot w/ RW. Total assist w/c transport to/from therapy gym. Mod assist sit>stand from w/c prior to pivoting to NuStep, however no carryover of blocked practice of technique from morning session. NuStep 10 min @ level 1 w/ very slow movement pattern using all extremities. Frequent verbal and tactile cues to attend to  task and move at quicker speed to maximize benefit of task - for LE strengthening and global endurance. Music playing w/ hopes of increasing pt's arousal, helped minimally. Returned to  room and min assist gait to/from toilet w/ RW, max assist for LE garment management and ongoing tactile/verbal cues for upright posture until therapist pants have been pulled down all the way. Ended session on toilet and in care of NT.   Therapy Documentation Precautions:  Precautions Precautions: Fall, Back Required Braces or Orthoses: Spinal Brace Spinal Brace: Lumbar corset, Applied in sitting position Restrictions Weight Bearing Restrictions: No  Therapy/Group: Individual Therapy  Farah Benish K Kaiesha Tonner 06/19/2019, 11:12 AM

## 2019-06-19 NOTE — Patient Care Conference (Signed)
Inpatient RehabilitationTeam Conference and Plan of Care Update Date: 06/19/2019   Time: 9:55 AM    Patient Name: [name] Mathews      Medical Record Number: [id number]  Date of Birth: [birth year] Sex: Female         Room/Bed: 4M12C/4M12C-01 Payor Info: Payor: MEDICARE / Plan: MEDICARE PART A AND B / Product Type: *No Product type* /    Admitting Diagnosis: 3. SCI Team Other Ortho, Lumbar spondylolisthesis; 13-15days  Admit Date/Time:  06/10/2019  3:15 PM Admission Comments: No comment available   Primary Diagnosis:  <principal problem not specified> Principal Problem: <principal problem not specified>  Patient Active Problem List   Diagnosis Date Noted  . Postoperative confusion   . Drug induced constipation   . CKD (chronic kidney disease), stage II   . Labile blood pressure   . Diabetes mellitus type 2 in obese (HCC)   . Labile blood glucose   . Acute on chronic anemia   . Lumbar radiculopathy 06/10/2019  . Lumbar adjacent segment disease with spondylolisthesis 06/05/2019  . Hypersomnia with long sleep time, idiopathic 01/17/2018  . Snoring 01/17/2018  . Sleep apnea with hypersomnolence 01/17/2018  . Excessive daytime sleepiness 01/17/2018  . Normochromic normocytic anemia 05/22/2017  . Radiculopathy 07/11/2012  . HTN (hypertension) 07/08/2012  . Diabetes mellitus (HCC) 07/08/2012  . Encephalopathy acute 07/08/2012  . Tachycardia 07/08/2012  . UTI (lower urinary tract infection) 07/08/2012    Expected Discharge Date: Expected Discharge Date: 07/02/19  Team Members Present: Physician leading conference: Dr. Genice RougeMegan Lovorn Social Worker Present: Staci AcostaJenny Kalel Harty, LCSW Nurse Present: Vincente PoliAwndrea Troxler, RN PT Present: Peter Congoaylor Turkalo, PT OT Present: Amy Rounds, OT PPS Coordinator present : Fae PippinMelissa Bowie, SLP     Current Status/Progress Goal Weekly Team Focus  Medical   lacks initiation but better, pain controlled; renal fxn and anemia still need monitoring/concern as well as  BGs due to stopping metformin from diarrhea  reduce incontinent stools, control pain in therapy, improve initiation  reduce incontinent stools   Bowel/Bladder   incont of B&B, LBM 8/17  for pt to have regular toileting patterns  assess toileting q shift and prn   Swallow/Nutrition/ Hydration             ADL's   Min assist UB bathing and dressing, Max - total assist LB bathing/dressing, Max assist sit > stand and transfers +2 helpful for clothing management and hygiene with toileting  Min assist  ADL retraining, activity tolerance, initiation, increased participation, functional transfers   Mobility   max assist bed mobility and sit<>stands, min assist once standing to RW, gait up to 68', max multimodal cues for initiation and attention to task  min assist overall  OOB/upright tolerance, initiation, endurance and global strengthening   Communication             Safety/Cognition/ Behavioral Observations            Pain   has had no c/o painduring shift, has PRNs  to remain pain free and/or address new onset pain  assess pain q shift and prn   Skin   abd gauze on mid/lower back  to address current skin status and prevent further breakdown  assess skin q shift and prn    Rehab Goals Patient on target to meet rehab goals: Yes Rehab Goals Revised: none *See Care Plan and progress notes for long and short-term goals.     Barriers to Discharge  Current Status/Progress Possible Resolutions Date Resolved  Physician    Decreased caregiver support;Medical stability;Incontinence;Wound Care;Weight;Behavior;Other (comments);Nutrition means  decreased initiation  min-max assist for ADLs/mobility- total for LSO donning- goals min assist- doing better  continued rehab, regular schedule, treating diarrhea      Nursing                  PT                    OT                  SLP                SW                Discharge Planning/Teaching Needs:  Pt plans to return to her dtr's home  where she has been living.  Dtr does not want pt to go to SNF due to COVID-19.  Family education to occur closer to d/c.  Dtr is here daily for visits and plans to observe therapy when she is here. Pt also has a caregiver who may want/need training.   Team Discussion:  Pt's pain is being controlled.  She's sleepy, but doing better than she was in the beginning when pt was admitted.  Pt's incision looks good and pt is moving pretty well.  Pt is still mod A for UB tasks and total for LB; max A for sit to stand.  Pt has improved participation and initiation, with min A for txs.  Revisions to Treatment Plan:  none    Continued Need for Acute Rehabilitation Level of Care: The patient requires daily medical management by a physician with specialized training in physical medicine and rehabilitation for the following conditions: Daily direction of a multidisciplinary physical rehabilitation program to ensure safe treatment while eliciting the highest outcome that is of practical value to the patient.: Yes Daily medical management of patient stability for increased activity during participation in an intensive rehabilitation regime.: Yes Daily analysis of laboratory values and/or radiology reports with any subsequent need for medication adjustment of medical intervention for : Post surgical problems;Blood pressure problems;Renal problems;Neurological problems;Mood/behavior problems;Wound care problems   I attest that I was present, lead the team conference, and concur with the assessment and plan of the team.Team conference was held via web/ teleconference due to [place] - 19.   Carnelius Hammitt, Silvestre Mesi 06/20/2019, 8:49 AM

## 2019-06-19 NOTE — Progress Notes (Signed)
[place] PHYSICAL MEDICINE & REHABILITATION PROGRESS NOTE   Subjective/Complaints:  Pt reports feeling good- pain controlled- slept well- ready to eat breakfast- just woke up.   ROS.  Denies CP, shortness of breath, nausea, vomiting, diarrhea.  Objective:   No results found. Recent Labs    06/17/19 1005  WBC 7.9  HGB 8.1*  HCT 24.8*  PLT 309   Recent Labs    06/17/19 0928  NA 141  K 4.7  CL 114*  CO2 19*  GLUCOSE 103*  BUN 32*  CREATININE 1.22*  CALCIUM 7.0*    Intake/Output Summary (Last 24 hours) at 06/19/2019 1611 Last data filed at 06/19/2019 1300 Gross per 24 hour  Intake 590 ml  Output -  Net 590 ml     Physical Exam: Vital Signs Blood pressure (!) 109/51, pulse 72, temperature 98.1 F (36.7 C), temperature source Oral, resp. rate 18, height 5' 9.5" (1.765 m), weight 98.4 kg, SpO2 99 %. Constitutional: No distress . Vital signs and labs reviewed.  Just woke up, lying in bed, sleepy, appears comfortable, breakfast not there yet, N tech just woke pt up, NAD HENT: Normocephalic.  Atraumatic. Eyes: EOMI. No discharge. Cardiovascular: RRR Respiratory: CTA B/L GI: Non-distended. Soft, NT, (+)BS Skin: Warm and dry.  Intact. Incision - no erythema, mild edema, minimal drainage Psych: vague, Ox2-3,less slow to process, needs feeding due to lack of initiation still, but better than last week Musc: No edema.  No tenderness. Neurological: Alert Motor: 4-4+/5 throughout Skin: Warm and dry- exam of lumbar spine incision- no erythema, tiny bit of serous drainage- otherwise, trace edema. Marland Kitchen.   Psychiatric: Flat, more spontaneous  Assessment/Plan: 1. Functional deficits secondary to L1-L3 laninotomy/PLIF which require 3+ hours per day of interdisciplinary therapy in a comprehensive inpatient rehab setting.  Physiatrist is providing close team supervision and 24 hour management of active medical problems listed below.  Physiatrist and rehab team continue to assess  barriers to discharge/monitor patient progress toward functional and medical goals  Care Tool:  Bathing  Bathing activity did not occur: Refused Body parts bathed by patient: Right arm, Left arm, Chest, Face, Abdomen, Front perineal area, Right upper leg, Left upper leg   Body parts bathed by helper: Buttocks, Right lower leg, Left lower leg     Bathing assist Assist Level: Moderate Assistance - Patient 50 - 74%     Upper Body Dressing/Undressing Upper body dressing   What is the patient wearing?: Pull over shirt, Orthosis Orthosis activity level: Performed by helper  Upper body assist Assist Level: Minimal Assistance - Patient > 75%    Lower Body Dressing/Undressing Lower body dressing      What is the patient wearing?: Pants, Incontinence brief     Lower body assist Assist for lower body dressing: Maximal Assistance - Patient 25 - 49%     Toileting Toileting    Toileting assist Assist for toileting: Maximal Assistance - Patient 25 - 49%     Transfers Chair/bed transfer  Transfers assist     Chair/bed transfer assist level: Moderate Assistance - Patient 50 - 74%     Locomotion Ambulation   Ambulation assist      Assist level: Minimal Assistance - Patient > 75% Assistive device: Walker-rolling Max distance: 75'   Walk 10 feet activity   Assist     Assist level: Minimal Assistance - Patient > 75% Assistive device: Walker-rolling   Walk 50 feet activity   Assist Walk 50 feet with 2 turns  activity did not occur: Safety/medical concerns  Assist level: Minimal Assistance - Patient > 75% Assistive device: Walker-rolling    Walk 150 feet activity   Assist Walk 150 feet activity did not occur: Safety/medical concerns         Walk 10 feet on uneven surface  activity   Assist Walk 10 feet on uneven surfaces activity did not occur: Safety/medical concerns         Wheelchair     Assist Will patient use wheelchair at discharge?:  Yes Type of Wheelchair: Manual    Wheelchair assist level: Dependent - Patient 0%      Wheelchair 50 feet with 2 turns activity    Assist    Wheelchair 50 feet with 2 turns activity did not occur: Safety/medical concerns(Per PT)           Blood pressure (!) 109/51, pulse 72, temperature 98.1 F (36.7 C), temperature source Oral, resp. rate 18, height 5' 9.5" (1.765 m), weight 98.4 kg, SpO2 99 %.  Medical Problem List and Plan: 1.Decreased functional mobilitysecondary to L2-3 spondylolisthesis, L1-2 and 2-3 spinal stenosis compressing L2 and III nerve roots with lumbar radiculopathy. Status post L2-3 and L1-2 laminotomy foraminotomies posterior lumbar interbody fusion 06/05/2019. Continue CIR PT, OT -LSO when out of bed.  8/19- still Dependent to don LSO- but min to max otherwise 2. Antithrombotics: -DVT/anticoagulation:SCDs.   Vascular study negative for DVT on 8/11 -antiplatelet therapy: N/A 3. Pain Management:Flexeril 10 mg 3 times daily as needed, oxycodone 5 mg which the patient does not taking to any significant degree.  We may discontinue this.  We will continue acetaminophen on an as-needed basis since this does not have any cognitive side effects.  The patient has not been taking this either, no doses over the last 3 days.   8/17- off oxycodone and on tylenol and Flexeril prn only.  8/18- mainly taking tylenol  4. Mood:Rizadyne16 mg daily -antipsychotic agents: N/A 5. Neuropsych: This patientiscapable of making decisions on her own behalf. 6. Skin/Wound Care:Routine skin checks  8/17- lumbar incision healing well 7. Fluids/Electrolytes/Nutrition:Routine in and outs  8. ID. Continue Keflex 500 mg twice daily x7 days for serosanguineous back drainage.  Continue dressing changes, cannot use Lidoderm patch because of this 9.Acute on chronic anemia. Continue iron supplement  Hemoglobin 8.1 on  8/11  Continue to monitor 10.Diabetes mellitus. Hemoglobin A1c 6.5. Glucophage 500 mg twice daily, Actos 30 mg daily.   8/18- held metformin- daughter feels diarrhea/loose stools secondary to metformin, not all the bowel meds we had to give her to get her going- will hold x 2 days, per her request.  8/19- BGs under less control- will restart metformin tomorrow if OK with daughter   CBG (last 3)  Recent Labs    06/18/19 2050 06/19/19 0603 06/19/19 1200  GLUCAP 200* 135* 193*  stable 8/16  11.Hypertension. Avapro 150 mg daily, HCTZ 12.5 mg daily, Norvasc 10 mg daily. Monitor with increased mobility   Vitals:   06/19/19 0509 06/19/19 1332  BP: (!) 107/41 (!) 109/51  Pulse: 74 72  Resp: 18 18  Temp: 97.8 F (36.6 C) 98.1 F (36.7 C)  SpO2: 100% 99%  remains labile  12.CKD stage II. Creatinine baseline 1.25-1.57. Follow-up chemistries  Creatinine 1.46 on 8/11  8/17- Cr 1.22- BUN 32- a little dry, but doing better overall  Continue to monitor 13. Hyperlipidemia. Crestor 14. Constipation. Continue Linzess 8/12- large BM this AM-continue regimen  8/17- LBM yesterday  8/18-  2 BMs yesterday- daughter concerned too many BMs. No significant Bowel meds to decrease currently  8/19- no reports of loose stools today 15. Dispo- home with daughter  8/19- team conference today- d/c 9/1     LOS: 9 days A FACE TO FACE EVALUATION WAS PERFORMED  Severa Jeremiah 06/19/2019, 4:11 PM

## 2019-06-20 ENCOUNTER — Encounter (HOSPITAL_COMMUNITY): Payer: Self-pay | Admitting: Neurosurgery

## 2019-06-20 ENCOUNTER — Inpatient Hospital Stay (HOSPITAL_COMMUNITY): Payer: Medicare Other

## 2019-06-20 ENCOUNTER — Inpatient Hospital Stay (HOSPITAL_COMMUNITY): Payer: Medicare Other | Admitting: Occupational Therapy

## 2019-06-20 LAB — GLUCOSE, CAPILLARY
Glucose-Capillary: 146 mg/dL — ABNORMAL HIGH (ref 70–99)
Glucose-Capillary: 194 mg/dL — ABNORMAL HIGH (ref 70–99)
Glucose-Capillary: 169 mg/dL — ABNORMAL HIGH (ref 70–99)
Glucose-Capillary: 251 mg/dL — ABNORMAL HIGH (ref 70–99)

## 2019-06-20 MED ORDER — METFORMIN HCL 500 MG PO TABS
500.0000 mg | ORAL_TABLET | Freq: Every day | ORAL | Status: DC
  Administered 2019-06-21 – 2019-06-25 (×5): 500 mg via ORAL
  Filled 2019-06-20 (×5): qty 1

## 2019-06-20 NOTE — Progress Notes (Signed)
Social Work Patient ID: [name] Mathews, female   DOB: 1934-05-19, 85 y.o.   MRN: [id number]   CSW met with pt to update her on team conference discussion and targeted d/c date of 07-02-19 and later called pt's dtr to update her as well.  Pt was quiet as CSW gave her the update, but perked up when CSW said "discharge date".  Pt is looking forward to being home.  Dtr had been to see pt, so she was aware of date and will call CSW back to discuss further details.  CSW will continue to follow and assist as needed.

## 2019-06-20 NOTE — Progress Notes (Addendum)
Physical Therapy Session Note  Patient Details  Name: [name] Mathews MRN: [id number] Date of Birth: 1933/12/25  Today's Date: 06/20/2019 PT Individual Time: 1400-1500 PT Individual Time Calculation (min): 60 min   Short Term Goals: Week 2:  PT Short Term Goal 1 (Week 2): Pt will perform sit>stands w/ mod assist consistently PT Short Term Goal 2 (Week 2): Pt will perform bed mobility w/ mod assist PT Short Term Goal 3 (Week 2): Pt will initiate functional tasks w/ mod verbal/tactile cues 50% of the time PT Short Term Goal 4 (Week 2): Pt will initiate stair training  Skilled Therapeutic Interventions/Progress Updates:  Pt seated in w/c, finishing up with lunch, Terri, NT with her.  PT supervised remainder of pt's meal, giving occasional VCs to initiate and attend to task, and take smaller bites.  Seated Therapeutic exercise performed with LE to increase strength for functional mobility: 20 x 1 bil adductor squeezes, 10 x 1 R/L hip flexion, R/L long arc quad knee extension with ankle pumps at end range.  Manual cues needed for full ROM knee extension and ankle pumps.  Stand pivot w/c to mat  With CGA, cues for forward wt shift.  neuromuscular re-education via forced use, multimodal cues for sustained stretch bil heel cords, standing with forefeet on wedge, x 1 minute.  Pt unable to stand upright.  Pt stated that she needed to use toilet.    In room, sit> stand with close supervision.  Gait with RW to toilet ; transfer with CGA, multimodal cues for hand placement for safety.  Pt voided continently on toilet; mod cues for peri care.  Max assist for clothing mgt.  Gait to sink for hand washing with min cues for looking for items and managing water.  Gait in hallway x 100' with multiple turns, CGA, mod cues for upright posture and forward gaze.    At end of session, pt resting in w/c with needs at hand and seat belt alarm set.      Therapy Documentation Precautions:  Precautions Precautions:  Fall, Back Required Braces or Orthoses: Spinal Brace Spinal Brace: Lumbar corset, Applied in sitting position Restrictions Weight Bearing Restrictions: No Therapy Vitals Temp: (!) 97.5 F (36.4 C) Temp Source: Oral Pulse Rate: 70 Resp: 14 BP: (!) 92/53 Patient Position (if appropriate): Sitting Oxygen Therapy SpO2: 99 % O2 Device: Room Air Pain: Pain Assessment Pain Scale: 0-10 Pain Score: 0-No pain      Therapy/Group: Individual Therapy  Jakyle Petrucelli 06/20/2019, 4:20 PM

## 2019-06-20 NOTE — Progress Notes (Signed)
[place] PHYSICAL MEDICINE & REHABILITATION PROGRESS NOTE   Subjective/Complaints:  Pt reports feeling good-trying to void on BSC- water running to stimulate voiding- didn't while I was in room- just got LSO on with total assist.  No complaints except wants to go home.  ROS.  Denies CP, shortness of breath, nausea, vomiting, diarrhea.  Objective:   No results found. No results for input(s): WBC, HGB, HCT, PLT in the last 72 hours. No results for input(s): NA, K, CL, CO2, GLUCOSE, BUN, CREATININE, CALCIUM in the last 72 hours.  Intake/Output Summary (Last 24 hours) at 06/20/2019 1112 Last data filed at 06/20/2019 0907 Gross per 24 hour  Intake 698 ml  Output -  Net 698 ml     Physical Exam: Vital Signs Blood pressure 117/62, pulse 68, temperature 98.2 F (36.8 C), temperature source Oral, resp. rate 18, height 5' 9.5" (1.765 m), weight 93 kg, SpO2 100 %. Constitutional: No distress . Vital signs and labs reviewed. Sitting up on BSC in room, OT at bedside, water going to stimulate voiding, wearing LSO- but removed to assess incision, NAD HENT: Normocephalic.  Atraumatic. Eyes: EOMI.  Cardiovascular: RRR Respiratory: CTA B/L GI: Non-distended. Soft, NT, (+)BS Skin: Warm and dry.  Intact. Incision - no erythema, mild edema,no drainage- sutures out- tiny scabs on suture sites Psych: vague, Ox2-3,less slow to process, focused on trying to void- more focused than nml Musc: No edema.  No tenderness. Neurological: Alert Motor: 4-4+/5 throughout Skin: Warm and dry- exam of lumbar spine incision- no erythema, tiny bit of serous drainage- otherwise, trace edema. Marland Kitchen   Psychiatric: less flattened, more spontaneous  Assessment/Plan: 1. Functional deficits secondary to L1-L3 laninotomy/PLIF which require 3+ hours per day of interdisciplinary therapy in a comprehensive inpatient rehab setting.  Physiatrist is providing close team supervision and 24 hour management of active medical  problems listed below.  Physiatrist and rehab team continue to assess barriers to discharge/monitor patient progress toward functional and medical goals  Care Tool:  Bathing  Bathing activity did not occur: Refused Body parts bathed by patient: Right arm, Left arm, Chest, Face, Abdomen, Front perineal area, Right upper leg, Left upper leg   Body parts bathed by helper: Buttocks, Right lower leg, Left lower leg     Bathing assist Assist Level: Moderate Assistance - Patient 50 - 74%     Upper Body Dressing/Undressing Upper body dressing   What is the patient wearing?: Pull over shirt, Orthosis Orthosis activity level: Performed by helper  Upper body assist Assist Level: Minimal Assistance - Patient > 75%    Lower Body Dressing/Undressing Lower body dressing      What is the patient wearing?: Pants, Incontinence brief     Lower body assist Assist for lower body dressing: Maximal Assistance - Patient 25 - 49%     Toileting Toileting    Toileting assist Assist for toileting: Moderate Assistance - Patient 50 - 74%     Transfers Chair/bed transfer  Transfers assist     Chair/bed transfer assist level: Moderate Assistance - Patient 50 - 74%     Locomotion Ambulation   Ambulation assist      Assist level: Minimal Assistance - Patient > 75% Assistive device: Walker-rolling Max distance: 75'   Walk 10 feet activity   Assist     Assist level: Minimal Assistance - Patient > 75% Assistive device: Walker-rolling   Walk 50 feet activity   Assist Walk 50 feet with 2 turns activity did not occur:  Safety/medical concerns  Assist level: Minimal Assistance - Patient > 75% Assistive device: Walker-rolling    Walk 150 feet activity   Assist Walk 150 feet activity did not occur: Safety/medical concerns         Walk 10 feet on uneven surface  activity   Assist Walk 10 feet on uneven surfaces activity did not occur: Safety/medical concerns          Wheelchair     Assist Will patient use wheelchair at discharge?: Yes Type of Wheelchair: Manual    Wheelchair assist level: Dependent - Patient 0%      Wheelchair 50 feet with 2 turns activity    Assist    Wheelchair 50 feet with 2 turns activity did not occur: Safety/medical concerns(Per PT)           Blood pressure 117/62, pulse 68, temperature 98.2 F (36.8 C), temperature source Oral, resp. rate 18, height 5' 9.5" (1.765 m), weight 93 kg, SpO2 100 %.  Medical Problem List and Plan: 1.Decreased functional mobilitysecondary to L2-3 spondylolisthesis, L1-2 and 2-3 spinal stenosis compressing L2 and III nerve roots with lumbar radiculopathy. Status post L2-3 and L1-2 laminotomy foraminotomies posterior lumbar interbody fusion 06/05/2019. Continue CIR PT, OT -LSO when out of bed.  8/19- still Dependent to don LSO- but min to max otherwise  8/20- incision looks great- sutures out 2. Antithrombotics: -DVT/anticoagulation:SCDs.   Vascular study negative for DVT on 8/11 -antiplatelet therapy: N/A 3. Pain Management:Flexeril 10 mg 3 times daily as needed, oxycodone 5 mg which the patient does not taking to any significant degree.  We may discontinue this.  We will continue acetaminophen on an as-needed basis since this does not have any cognitive side effects.  The patient has not been taking this either, no doses over the last 3 days.   8/17- off oxycodone and on tylenol and Flexeril prn only.  8/18- mainly taking tylenol  4. Mood:Rizadyne16 mg daily -antipsychotic agents: N/A 5. Neuropsych: This patientiscapable of making decisions on her own behalf. 6. Skin/Wound Care:Routine skin checks  8/17- lumbar incision healing well 7. Fluids/Electrolytes/Nutrition:Routine in and outs  8. ID. Continue Keflex 500 mg twice daily x7 days for serosanguineous back drainage.  Continue dressing changes, cannot use  Lidoderm patch because of this 9.Acute on chronic anemia. Continue iron supplement  Hemoglobin 8.1 on 8/11  Continue to monitor 10.Diabetes mellitus. Hemoglobin A1c 6.5. Glucophage 500 mg twice daily, Actos 30 mg daily.   8/18- held metformin- daughter feels diarrhea/loose stools secondary to metformin, not all the bowel meds we had to give her to get her going- will hold x 2 days, per her request.  8/19- BGs under less control- will restart metformin tomorrow if OK with daughter  8/20- will restart Metformin QDAY, not BID 500 mg- to help BG control, esp with BGs running mid 100s-high 100s in last 48 hrs. If doesn't work, will stop.   CBG (last 3)  Recent Labs    06/19/19 1719 06/19/19 2051 06/20/19 0629  GLUCAP 155* 113* 146*  stable 8/16  11.Hypertension. Avapro 150 mg daily, HCTZ 12.5 mg daily, Norvasc 10 mg daily. Monitor with increased mobility   Vitals:   06/19/19 1940 06/20/19 0451  BP: (!) 103/54 117/62  Pulse: 73 68  Resp: 18 18  Temp: 98 F (36.7 C) 98.2 F (36.8 C)  SpO2:  100%  remains labile  12.CKD stage II. Creatinine baseline 1.25-1.57. Follow-up chemistries  Creatinine 1.46 on 8/11  8/17- Cr 1.22- BUN  32- a little dry, but doing better overall  Continue to monitor 13. Hyperlipidemia. Crestor 14. Constipation. Continue Linzess 8/12- large BM this AM-continue regimen  8/17- LBM yesterday  8/18- 2 BMs yesterday- daughter concerned too many BMs. No significant Bowel meds to decrease currently  8/19- no reports of loose stools today  8/20- LBM 8/17 AM- will restart Metformin low dose and see if helps, since needed anyway, and ,ay not need Bowel meds 15. Dispo- home with daughter  8/19- team conference today- d/c 9/1     LOS: 10 days A FACE TO FACE EVALUATION WAS PERFORMED  Saira Kramme 06/20/2019, 11:12 AM

## 2019-06-20 NOTE — Progress Notes (Signed)
Physical Therapy Session Note  Patient Details  Name: [name] Mathews MRN: [id number] Date of Birth: 09-Mar-1934  Today's Date: 06/20/2019 PT Individual Time: 1032-1115 PT Individual Time Calculation (min): 43 min   Short Term Goals: Week 2:  PT Short Term Goal 1 (Week 2): Pt will perform sit>stands w/ mod assist consistently PT Short Term Goal 2 (Week 2): Pt will perform bed mobility w/ mod assist PT Short Term Goal 3 (Week 2): Pt will initiate functional tasks w/ mod verbal/tactile cues 50% of the time PT Short Term Goal 4 (Week 2): Pt will initiate stair training  Skilled Therapeutic Interventions/Progress Updates:   Focused on functional transfers, bed mobility on flat bed to assess need for hospital bed, toilet transfers, stair negotiation and gait with RW. Pt required light mod assist for initial sit -> stand from recliner with verbal cues for hand placement and technique. Transferred with min assist with RW (a few steps) to the bed (states height of bed at home is similar to height of hospital bed, slightly elevated) with verbal cues for safe RW positioning. Performed sit <> supine without use of rails to the R (pt reports this is set up at home) with supervision for cues for adherence to back precautions. Request to use bathroom, stood from EOB with supervision and min assist gait with RW into the bathroom with verbal cues needed for safe RW positioning (keeps off to the side or not close enough). Steady assist for balance while pt managed clothing. Positive for urine void. Extra time and processing needed for initiation to get toilet paper and then transfer off of toilet. Pt able to get pants and brief pulled up once PT started (too low to reach from floor without breaking precautions) and supervision for sit -> stand. Returned to w/c in same manner as described above with RW. Stair negotiation training for home entry practice with min assist using bilateral handrails x 4 steps with self  selected reciprocal pattern.Gait training x 100' back to pt room with CGA wit RW with cues for RW positioning and upright posture. Returned to bed end of session with supervision and assist for doffing LSO.   Therapy Documentation Precautions:  Precautions Precautions: Fall, Back Required Braces or Orthoses: Spinal Brace Spinal Brace: Lumbar corset, Applied in sitting position Restrictions Weight Bearing Restrictions: No    Pain:  Denies pain.    Therapy/Group: Individual Therapy  [name], PT, DPT, CBIS  06/20/2019, 12:31 PM

## 2019-06-20 NOTE — Progress Notes (Signed)
Occupational Therapy Session Note  Patient Details  Name: [name] Mathews MRN: [id number] Date of Birth: [birth year]  Today's Date: 06/20/2019 OT Individual Time: 7124-5809 OT Individual Time Calculation (min): 45 min    Short Term Goals: Week 2:  OT Short Term Goal 1 (Week 2): Pt will complete LB dressing with max assist of one caregiver OT Short Term Goal 2 (Week 2): Pt will don LSO with mod assist OT Short Term Goal 3 (Week 2): Pt will complete sit > stand for LB dressing with mod assist consistently OT Short Term Goal 4 (Week 2): Pt will complete toilet transfer with mod assist  Skilled Therapeutic Interventions/Progress Updates:    Treatment session with focus on functional mobility, self-care retraining, and increased participation in self-care tasks.  Pt received supine in bed asleep, easily aroused and agreeable to therapy session.  Max multimodal cues for sequencing bed mobility and max assist from sidelying to sitting from nearly flat bed with heavy use of bed rails - may benefit from hospital bed at home.  Once upright pt required min guard for sitting balance during UB dressing, one LOB backwards when therapist donning LSO requiring mod assist to correct.  Pt incontinent of urine.  Completed sit > stand mod assist from elevated bed to increase initiation and success.  Once upright completed stand pivot transfer to [facility] with min assist with RW.  Educated on use of reacher for LB dressing, pt continuing to require hand over hand assist and max cues to complete.  Pt able to complete sit > stand from [facility] with mod cues for hand placement and min assist to come up to standing.  Pt assisted with pulling pants up in front while therapist pulled pants over hips and buttocks.  Ambulated to recliner with min assist with RW and left upright in recliner with seat belt alarm on and all needs in reach.  Notified nurse tech of pt position to allow for supervision with breakfast.   Therapy  Documentation Precautions:  Precautions Precautions: Fall, Back Required Braces or Orthoses: Spinal Brace Spinal Brace: Lumbar corset, Applied in sitting position Restrictions Weight Bearing Restrictions: No General:   Vital Signs: Therapy Vitals Temp: 98.2 F (36.8 C) Temp Source: Oral Pulse Rate: 68 Resp: 18 BP: 117/62 Patient Position (if appropriate): Lying Oxygen Therapy SpO2: 100 % O2 Device: Room Air Pain: Pt with no c/o pain  Therapy/Group: Individual Therapy  Tatisha Cerino, [place] 06/20/2019, 8:30 AM

## 2019-06-21 ENCOUNTER — Inpatient Hospital Stay (HOSPITAL_COMMUNITY): Payer: Medicare Other

## 2019-06-21 ENCOUNTER — Inpatient Hospital Stay (HOSPITAL_COMMUNITY): Payer: Medicare Other | Admitting: Occupational Therapy

## 2019-06-21 LAB — GLUCOSE, CAPILLARY
Glucose-Capillary: 149 mg/dL — ABNORMAL HIGH (ref 70–99)
Glucose-Capillary: 194 mg/dL — ABNORMAL HIGH (ref 70–99)
Glucose-Capillary: 152 mg/dL — ABNORMAL HIGH (ref 70–99)
Glucose-Capillary: 178 mg/dL — ABNORMAL HIGH (ref 70–99)

## 2019-06-21 MED ORDER — LINACLOTIDE 145 MCG PO CAPS
145.0000 ug | ORAL_CAPSULE | Freq: Every day | ORAL | Status: DC
  Administered 2019-06-22 – 2019-06-28 (×2): 145 ug via ORAL
  Filled 2019-06-21 (×3): qty 1

## 2019-06-21 MED ORDER — SORBITOL 70 % SOLN
30.0000 mL | Freq: Every day | Status: DC | PRN
  Administered 2019-06-21 – 2019-06-27 (×3): 30 mL via ORAL
  Filled 2019-06-21 (×3): qty 30

## 2019-06-21 MED ORDER — SENNA 8.6 MG PO TABS
1.0000 | ORAL_TABLET | Freq: Two times a day (BID) | ORAL | Status: DC
  Administered 2019-06-21 – 2019-06-29 (×8): 8.6 mg via ORAL
  Filled 2019-06-21 (×11): qty 1

## 2019-06-21 NOTE — Progress Notes (Signed)
[place] PHYSICAL MEDICINE & REHABILITATION PROGRESS NOTE   Subjective/Complaints:  Pt reports " still sleeping, however will get up when nursing gets her up".   ROS.  Denies CP, shortness of breath, nausea, vomiting, diarrhea.  Objective:   No results found. No results for input(s): WBC, HGB, HCT, PLT in the last 72 hours. No results for input(s): NA, K, CL, CO2, GLUCOSE, BUN, CREATININE, CALCIUM in the last 72 hours.  Intake/Output Summary (Last 24 hours) at 06/21/2019 1457 Last data filed at 06/21/2019 1348 Gross per 24 hour  Intake 1122 ml  Output -  Net 1122 ml     Physical Exam: Vital Signs Blood pressure (!) 114/56, pulse 75, temperature 98.6 F (37 C), temperature source Oral, resp. rate 16, height 5' 9.5" (1.765 m), weight 93 kg, SpO2 98 %. Constitutional: No distress . Vital signs a reviewed. Sleeping/on L side in bed, eyes closed, NAD Eyes: EOMI.  Cardiovascular: RRR Respiratory: CTA B/L GI: Non-distended. Soft, NT, (+)BS Skin: Warm and dry.  Intact. Incision - no erythema, mild edema,no drainage- sutures out- tiny scabs on suture sites Psych: vague, sleepy Musc: No edema.  No tenderness. Neurological: Alert Motor: 4-4+/5 throughout Skin: Warm and dry- exam of lumbar spine incision- no erythema, tiny bit of serous drainage- otherwise, trace edema. Marland Kitchen.   Psychiatric: less flattened, more spontaneous  Assessment/Plan: 1. Functional deficits secondary to L1-L3 laninotomy/PLIF which require 3+ hours per day of interdisciplinary therapy in a comprehensive inpatient rehab setting.  Physiatrist is providing close team supervision and 24 hour management of active medical problems listed below.  Physiatrist and rehab team continue to assess barriers to discharge/monitor patient progress toward functional and medical goals  Care Tool:  Bathing  Bathing activity did not occur: Refused Body parts bathed by patient: Right arm, Left arm, Chest, Face, Abdomen, Front  perineal area, Right upper leg, Left upper leg   Body parts bathed by helper: Buttocks, Right lower leg, Left lower leg     Bathing assist Assist Level: Moderate Assistance - Patient 50 - 74%     Upper Body Dressing/Undressing Upper body dressing   What is the patient wearing?: Pull over shirt, Orthosis Orthosis activity level: Performed by helper  Upper body assist Assist Level: Minimal Assistance - Patient > 75%    Lower Body Dressing/Undressing Lower body dressing      What is the patient wearing?: Pants, Incontinence brief     Lower body assist Assist for lower body dressing: Maximal Assistance - Patient 25 - 49%     Toileting Toileting    Toileting assist Assist for toileting: Contact Guard/Touching assist     Transfers Chair/bed transfer  Transfers assist     Chair/bed transfer assist level: Contact Guard/Touching assist     Locomotion Ambulation   Ambulation assist      Assist level: Contact Guard/Touching assist Assistive device: Walker-rolling Max distance: 10'   Walk 10 feet activity   Assist     Assist level: Contact Guard/Touching assist Assistive device: Walker-rolling   Walk 50 feet activity   Assist Walk 50 feet with 2 turns activity did not occur: Safety/medical concerns  Assist level: Contact Guard/Touching assist Assistive device: Walker-rolling    Walk 150 feet activity   Assist Walk 150 feet activity did not occur: Safety/medical concerns         Walk 10 feet on uneven surface  activity   Assist Walk 10 feet on uneven surfaces activity did not occur: Safety/medical concerns  Wheelchair     Assist Will patient use wheelchair at discharge?: Yes Type of Wheelchair: Manual    Wheelchair assist level: Dependent - Patient 0%      Wheelchair 50 feet with 2 turns activity    Assist    Wheelchair 50 feet with 2 turns activity did not occur: Safety/medical concerns(Per PT)            Blood pressure (!) 114/56, pulse 75, temperature 98.6 F (37 C), temperature source Oral, resp. rate 16, height 5' 9.5" (1.765 m), weight 93 kg, SpO2 98 %.  Medical Problem List and Plan: 1.Decreased functional mobilitysecondary to L2-3 spondylolisthesis, L1-2 and 2-3 spinal stenosis compressing L2 and III nerve roots with lumbar radiculopathy. Status post L2-3 and L1-2 laminotomy foraminotomies posterior lumbar interbody fusion 06/05/2019. Continue CIR PT, OT -LSO when out of bed.  8/19- still Dependent to don LSO- but min to max otherwise  8/20- incision looks great- sutures out 2. Antithrombotics: -DVT/anticoagulation:SCDs.   Vascular study negative for DVT on 8/11 -antiplatelet therapy: N/A 3. Pain Management:Flexeril 10 mg 3 times daily as needed, oxycodone 5 mg which the patient does not taking to any significant degree.  We may discontinue this.  We will continue acetaminophen on an as-needed basis since this does not have any cognitive side effects.  The patient has not been taking this either, no doses over the last 3 days.   8/17- off oxycodone and on tylenol and Flexeril prn only.  8/18- mainly taking tylenol  8/21- only taking tylenol  4. Mood:Rizadyne16 mg daily -antipsychotic agents: N/A 5. Neuropsych: This patientiscapable of making decisions on her own behalf. 6. Skin/Wound Care:Routine skin checks  8/17- lumbar incision healing well 7. Fluids/Electrolytes/Nutrition:Routine in and outs  8. ID. Continue Keflex 500 mg twice daily x7 days for serosanguineous back drainage.  Continue dressing changes, cannot use Lidoderm patch because of this 9.Acute on chronic anemia. Continue iron supplement  Hemoglobin 8.1 on 8/11  Continue to monitor 10.Diabetes mellitus. Hemoglobin A1c 6.5. Glucophage 500 mg twice daily, Actos 30 mg daily.   8/18- held metformin- daughter feels diarrhea/loose stools secondary to  metformin, not all the bowel meds we had to give her to get her going- will hold x 2 days, per her request.  8/19- BGs under less control- will restart metformin tomorrow if OK with daughter  8/20- will restart Metformin QDAY, not BID 500 mg- to help BG control, esp with BGs running mid 100s-high 100s in last 48 hrs. If doesn't work, will stop.  8/21- BGs look better after got Metformin   CBG (last 3)  Recent Labs    06/20/19 2140 06/21/19 0625 06/21/19 1140  GLUCAP 251* 149* 194*  stable 8/16  11.Hypertension. Avapro 150 mg daily, HCTZ 12.5 mg daily, Norvasc 10 mg daily. Monitor with increased mobility   Vitals:   06/21/19 0452 06/21/19 1436  BP: (!) 114/53 (!) 114/56  Pulse: 72 75  Resp: 18 16  Temp: 98.9 F (37.2 C) 98.6 F (37 C)  SpO2: 100% 98%  remains labile  12.CKD stage II. Creatinine baseline 1.25-1.57. Follow-up chemistries  Creatinine 1.46 on 8/11  8/17- Cr 1.22- BUN 32- a little dry, but doing better overall  Continue to monitor 13. Hyperlipidemia. Crestor 14. Constipation. Continue Linzess 8/12- large BM this AM-continue regimen  8/17- LBM yesterday  8/18- 2 BMs yesterday- daughter concerned too many BMs. No significant Bowel meds to decrease currently  8/19- no reports of loose stools today  8/20- LBM  8/17 AM- will restart Metformin low dose and see if helps, since needed anyway, and ,ay not need Bowel meds  8/21- no BMs since 8/17- will give Sorbitol and restart Senokot low dose BID 15. Dispo- home with daughter  8/19- team conference today- d/c 9/1     LOS: 11 days A FACE TO FACE EVALUATION WAS PERFORMED  Cybil Senegal 06/21/2019, 2:57 PM

## 2019-06-21 NOTE — Progress Notes (Signed)
Occupational Therapy Session Note  Patient Details  Name: [name] Mathews MRN: [id number] Date of Birth: 30-Apr-1934  Today's Date: 06/21/2019 OT Individual Time: 0830-0920 OT Individual Time Calculation (min): 50 min    Short Term Goals: Week 2:  OT Short Term Goal 1 (Week 2): Pt will complete LB dressing with max assist of one caregiver OT Short Term Goal 2 (Week 2): Pt will don LSO with mod assist OT Short Term Goal 3 (Week 2): Pt will complete sit > stand for LB dressing with mod assist consistently OT Short Term Goal 4 (Week 2): Pt will complete toilet transfer with mod assist  Skilled Therapeutic Interventions/Progress Updates:    Treatment session with focus on arousal, initiation, and sequencing during self-care tasks.  Pt received supine in bed, agreeable to therapy session but much more fatigued this AM.  Pt completed bed mobility to come to sidelying with supervision/mod cues followed by mod assist sidelying to sitting from flat bed with no bed rails.  Pt requiring increased cues to maintain attention to task and to complete tasks.  Pt would initiate bathing, but then require max cues to sequence and complete task to thoroughness - pt even asking "am I done?" mid bath.  Increased cues for sequencing of UB dressing.  Hand over hand assist and setup to utilize reacher to decrease burden of care with LB dressing, however still requiring max assist due to decreased initiation.  Pt reports need to toilet.  Completed stand pivot transfer bed > BSC with min assist with increased time for sit > stand.  Pt able to complete perineal hygiene post toileting and pull pants over hips.  Stand pivot BSC > recliner with CGA.  Pt left upright in recliner awaiting breakfast supervision.  Therapy Documentation Precautions:  Precautions Precautions: Fall, Back Required Braces or Orthoses: Spinal Brace Spinal Brace: Lumbar corset, Applied in sitting position Restrictions Weight Bearing Restrictions:  No General:   Vital Signs:  Pain:  Pt with no c/o pain  Therapy/Group: Individual Therapy  Simonne Come 06/21/2019, 10:22 AM

## 2019-06-21 NOTE — Progress Notes (Signed)
Physical Therapy Session Note  Patient Details  Name: [name] Mathews MRN: [id number] Date of Birth: Sep 30, 1934  Today's Date: 06/21/2019 PT Individual Time: 0950-1050 PT Individual Time Calculation (min): 60 min   Short Term Goals: Week 2:  PT Short Term Goal 1 (Week 2): Pt will perform sit>stands w/ mod assist consistently PT Short Term Goal 2 (Week 2): Pt will perform bed mobility w/ mod assist PT Short Term Goal 3 (Week 2): Pt will initiate functional tasks w/ mod verbal/tactile cues 50% of the time PT Short Term Goal 4 (Week 2): Pt will initiate stair training  Skilled Therapeutic Interventions/Progress Updates:    Pt more fatigued today and requiring more encouragement to participate throughout session as well as delayed processing and up to max cues for sequencing and initiation. Offered banana and yogurt as pt did not eat breakfast earlier this morning. Cues for attention to task but able to eat 100%. CGA for sit <> stand with significant amount of extra time from recliner with RW and cues for hand placement. Steady assist for gait into bathroom with RW with cues for safe positioning of RW. Performed clothing management and hygiene with steady assist for balance and cues for attending and completing task. Positive urine void in toilet (brief was slightly wet). Supervision for sit -> stand from [facility] and steady assist for gait out of bathroom with RW with cues for safe walker positioning. Performed transfers x 2 more w/c <> mat with cues and assist as described above. Engaged in dynamic standing balance activity to complete functional UE task on table top while standing on compliant surface with 1 UE support and min assist for balance. Pt with flexed posture and then states she is too tired to continue. Allowed for seated rest break on mat and pt closing her eyes and swaying at trunk. She just states "I am so tired." and requesting to return to bed. CGA for transfer back to w/c and total assist  via w/c to return to room. CGA transfer with RW back to bed with short distance gait x 10' with cues for RW positioning. Assist to doff LSO EOB and then supervision to return to supine and reposition in the bed. All needs in reach.   Therapy Documentation Precautions:  Precautions Precautions: Fall, Back Required Braces or Orthoses: Spinal Brace Spinal Brace: Lumbar corset, Applied in sitting position Restrictions Weight Bearing Restrictions: No  Pain:  No complaints.    Therapy/Group: Individual Therapy  [name], PT, DPT, CBIS  06/21/2019, 11:01 AM

## 2019-06-21 NOTE — Progress Notes (Signed)
Physical Therapy Session Note  Patient Details  Name: [name] Mathews MRN: [id number] Date of Birth: Mar 25, 1934  Today's Date: 06/21/2019 PT Individual Time: 1545-1615 PT Individual Time Calculation (min): 30 min   Short Term Goals: Week 1:  PT Short Term Goal 1 (Week 1): Pt will consistently perform sit<>stand transfers w/ max assist PT Short Term Goal 1 - Progress (Week 1): Met PT Short Term Goal 2 (Week 1): Pt will perform gait x25' in controlled environment with mod assist PT Short Term Goal 2 - Progress (Week 1): Met Week 2:  PT Short Term Goal 1 (Week 2): Pt will perform sit>stands w/ mod assist consistently PT Short Term Goal 2 (Week 2): Pt will perform bed mobility w/ mod assist PT Short Term Goal 3 (Week 2): Pt will initiate functional tasks w/ mod verbal/tactile cues 50% of the time PT Short Term Goal 4 (Week 2): Pt will initiate stair training Week 3:     Skilled Therapeutic Interventions/Progress Updates:      Therapy Documentation Precautions:  Precautions Precautions: Fall, Back Required Braces or Orthoses: Spinal Brace Spinal Brace: Lumbar corset, Applied in sitting position Restrictions Weight Bearing Restrictions: No PM Session  Pain:  Pt denies pain this pm   Treatment session:   Pt initially sleeping in bed, slow to arouse. Reviewed spinal precautions, pt unable to verbalize for therapist. Supine to side w/verbal cues/tactile cues. Side to sit w/rail w/min assist. Pt initially min assist for sitting balance on edge of mat while therapist donned aspen brace. Sit to stand w/min assist and vc for hand placement and achieving erect posture.  Pt stated she needed to urinate. Gait 6f to commode and transferred to commode w/min assist and verbal cues for sequencing, cues for posture, cues to stay close enough to walker w/gait.  Commode transfer w/min assist and RW.  Pt able to stand for hygiene w/verbal cues to encourage pt to perform herself.  Dependent for  raising brief and pants, min assist for standing balance during dressing.    Gait 130fcommode to wc w/min assist, cues for posture and maintaining safe distance to walker.  Gait 60 ft in hallway as above, pt stated she need to use commode again.  Commode transfer, hygiene, dressing repeated as described above for second time.    Gait 15 ft to bed, transferred to edge of bed w/RW, min assist, cues for safe distance from walker and posture.  Therapist removed Aspen brace. Sit to supine w/min assist for LE's and to utilize logrolling technique/spine precs.  Pt able to scoot in bed w/verbal instruction, use of rails.    Pt left supine, rails up x 3, bed alarm set, bed in low position, needs in reach.     Assessment: Pt moves very slowly and verbalized minimally during session.  Appears groggy.  Currently min assist with most mobility w/RW   Therapy/Group: Individual Therapy  BaCallie FieldingPT  06/21/2019, 4:10 PM

## 2019-06-22 ENCOUNTER — Inpatient Hospital Stay (HOSPITAL_COMMUNITY): Payer: Medicare Other

## 2019-06-22 DIAGNOSIS — R410 Disorientation, unspecified: Secondary | ICD-10-CM

## 2019-06-22 LAB — GLUCOSE, CAPILLARY
Glucose-Capillary: 145 mg/dL — ABNORMAL HIGH (ref 70–99)
Glucose-Capillary: 138 mg/dL — ABNORMAL HIGH (ref 70–99)
Glucose-Capillary: 185 mg/dL — ABNORMAL HIGH (ref 70–99)
Glucose-Capillary: 98 mg/dL (ref 70–99)

## 2019-06-22 NOTE — Progress Notes (Signed)
[place] PHYSICAL MEDICINE & REHABILITATION PROGRESS NOTE   Subjective/Complaints:  Patient feels well.  She feels like she is making progress with therapy.  Objective:  Physical Exam: Vital Signs Blood pressure 130/65, pulse 80, temperature 98.8 F (37.1 C), temperature source Oral, resp. rate 16, height 5' 9.5" (1.765 m), weight 93 kg, SpO2 96 %.  Bright, personable elderly lady in no acute distress.  HEENT exam atraumatic, normocephalic Neck supple Chest clear to auscultation Cardiac exam S1 and S2 are regular Abdominal exam active bowel sounds, soft Extremities without edema  Assessment/Plan: 1. Functional deficits secondary to L1-L3 laninotomy/PLIF  Medical Problem List and Plan: 1.Decreased functional mobilitysecondary to L2-3 spondylolisthesis, L1-2 and 2-3 spinal stenosis compressing L2 and III nerve roots with lumbar radiculopathy. Status post L2-3 and L1-2 laminotomy foraminotomies posterior lumbar interbody fusion 06/05/2019. Continue inpatient rehab. 2. Antithrombotics: -DVT/anticoagulation:SCDs.   Vascular study negative for DVT on 8/11 -antiplatelet therapy: N/A 3. Pain Management:Well-controlled.  Continue current medications. 4. Mood:Rizadyne16 mg daily -antipsychotic agents: N/A 5. Neuropsych: This patientiscapable of making decisions on her own behalf. 6. Skin/Wound Care:Routine skin checks  8/17- lumbar incision healing well 7. Fluids/Electrolytes/Nutrition:Routine in and outs  Basic Metabolic Panel:    Component Value Date/Time   NA 141 06/17/2019 0928   NA 142 05/22/2017 1437   K 4.7 06/17/2019 0928   CL 114 (H) 06/17/2019 0928   CO2 19 (L) 06/17/2019 0928   BUN 32 (H) 06/17/2019 0928   BUN 18 05/22/2017 1437   CREATININE 1.22 (H) 06/17/2019 0928   GLUCOSE 103 (H) 06/17/2019 0928   CALCIUM 7.0 (L) 06/17/2019 0928   8. ID.  Completed cephalexin for serosanguineous drainage of the back. 9.Acute on chronic  anemia. Continue iron supplement  Hemoglobin 8.1 on 8/11  Continue to monitor 10.Diabetes mellitus. Hemoglobin A1c 6.5.  Adequate control. CBG (last 3)  Recent Labs    06/21/19 1647 06/21/19 2120 06/22/19 0607  GLUCAP 152* 178* 145*  stable 8/16  11.Hypertension. Avapro 150 mg daily, HCTZ 12.5 mg daily, Norvasc 10 mg daily. Monitor with increased mobility   Vitals:   06/21/19 2026 06/22/19 0534  BP: (!) 108/54 130/65  Pulse: 77 80  Resp: 18 16  Temp: 98.9 F (37.2 C) 98.8 F (37.1 C)  SpO2: 100% 96%  We will continue to monitor. 12.CKD stage II.  Lab Results  Component Value Date   CREATININE 1.22 (H) 06/17/2019    13. Hyperlipidemia. Crestor 14. Constipation.  Resolved.   51. Dispo- home with daughter  8/19- team conference today- d/c 9/1     LOS: 12 days A FACE TO FACE EVALUATION WAS PERFORMED   H  06/22/2019, 12:02 PM

## 2019-06-22 NOTE — Progress Notes (Signed)
Occupational Therapy Session Note  Patient Details  Name: [name] Mathews MRN: [id number] Date of Birth: February 25, 1934  Today's Date: 06/22/2019 OT Individual Time: 1500-1600 OT Individual Time Calculation (min): 60 min    Short Term Goals: Week 1:  OT Short Term Goal 1 (Week 1): Pt will complete bathing with max assist of one caregiver OT Short Term Goal 1 - Progress (Week 1): Met OT Short Term Goal 2 (Week 1): Pt will complete UB dressing with mod assist OT Short Term Goal 2 - Progress (Week 1): Met OT Short Term Goal 3 (Week 1): Pt will complete toilet transfer with max assist of one caregiver OT Short Term Goal 3 - Progress (Week 1): Met OT Short Term Goal 4 (Week 1): Pt will complete LB dressing with max assist of one caregiver OT Short Term Goal 4 - Progress (Week 1): Progressing toward goal OT Short Term Goal 5 (Week 1): Pt will don LSO with mod assist OT Short Term Goal 5 - Progress (Week 1): Progressing toward goal  Skilled Therapeutic Interventions/Progress Updates:    1:1. Pt received with daughter present in room. No pain reported, just "cold." daughter upset pt has not been bathed yet today and OT offers to bathe and dress pt in nightgown. Pt completes bathing sit to stand at sink with A to wash buttock and CGA for standing balance. Pt dons gown and orthosis with supervision/VC for brace sequencing. Pt completes sock donning with MOD A using sock aide and max VC for sequencing. Pt reports need to toilet. Pt ambulates iwht RW and CGA to BSC over toilet. Eixted session with pt seated on toilet, daughter in room reporting she will call NT to assist pt back to bed when pt is finished. NT aware.  Therapy Documentation Precautions:  Precautions Precautions: Fall, Back Required Braces or Orthoses: Spinal Brace Spinal Brace: Lumbar corset, Applied in sitting position Restrictions Weight Bearing Restrictions: No General:   Vital Signs: Therapy Vitals Temp: 98.1 F (36.7 C) Temp  Source: Oral Pulse Rate: 67 Resp: 18 BP: (!) 109/53 Patient Position (if appropriate): Sitting Oxygen Therapy SpO2: 100 % O2 Device: Room Air Pain:   ADL: ADL ADL Comments: Total assist for all due to lethargy and decreased ability to follow commands Vision   Perception    Praxis   Exercises:   Other Treatments:     Therapy/Group: Individual Therapy  Tonny Branch 06/22/2019, 4:04 PM

## 2019-06-22 NOTE — Progress Notes (Signed)
Physical Therapy Session Note  Patient Details  Name: [name] Mathews MRN: [id number] Date of Birth: 01-Sep-1934  Today's Date: 06/22/2019 PT Individual Time: 8250-5397 PT Individual Time Calculation (min): 55 min   Short Term Goals: Week 2:  PT Short Term Goal 1 (Week 2): Pt will perform sit>stands w/ mod assist consistently PT Short Term Goal 2 (Week 2): Pt will perform bed mobility w/ mod assist PT Short Term Goal 3 (Week 2): Pt will initiate functional tasks w/ mod verbal/tactile cues 50% of the time PT Short Term Goal 4 (Week 2): Pt will initiate stair training  Skilled Therapeutic Interventions/Progress Updates:   functional bed mobility retraining on flat bed for home simulation. Pt requires extra time and verbal cues but able to come to EOB with CGA just to facilitate trunk at the end for balance. Initially with mild posterior lean but able to correct with cues and pt maintains eyes closed at times, stating she is just so tired. Functional gait in and out of bathroom with min assist for steering of RW and steady for balance as pt with poor awareness of obstacles and poor positioning of RW, mod verbal cues. Pt unable to void but brief was already soiled (urine). Donned new brief and pants with min assist to pull up. Extra time due to pt falling asleep, performed CGA transfer with RW w/c <> Nustep with goal to focus on BUE/BLE strengthening and cardiovascualr endurance training. Pt unable to sustain enough alertness to complete task efficiently requiring frequent verbal and tactile cues to regain alertness. Ultimately, only able to perform a few bouts at a time before stopping and continues to keep eyes closed stating she is too tired. Returned back to room via w/c with Total assist and transferred to recliner with CGA as described above. RN made aware of increased lethargy today.   Therapy Documentation Precautions:  Precautions Precautions: Fall, Back Required Braces or Orthoses: Spinal  Brace Spinal Brace: Lumbar corset, Applied in sitting position Restrictions Weight Bearing Restrictions: No  Pain:  denies pain, just fatigue.    Therapy/Group: Individual Therapy  [name], PT, DPT, CBIS  06/22/2019, 9:55 AM

## 2019-06-23 ENCOUNTER — Inpatient Hospital Stay (HOSPITAL_COMMUNITY): Payer: Medicare Other

## 2019-06-23 ENCOUNTER — Inpatient Hospital Stay (HOSPITAL_COMMUNITY): Payer: Medicare Other | Admitting: Occupational Therapy

## 2019-06-23 LAB — GLUCOSE, CAPILLARY
Glucose-Capillary: 132 mg/dL — ABNORMAL HIGH (ref 70–99)
Glucose-Capillary: 215 mg/dL — ABNORMAL HIGH (ref 70–99)
Glucose-Capillary: 144 mg/dL — ABNORMAL HIGH (ref 70–99)
Glucose-Capillary: 120 mg/dL — ABNORMAL HIGH (ref 70–99)

## 2019-06-23 NOTE — Progress Notes (Signed)
[facility] PHYSICAL MEDICINE & REHABILITATION PROGRESS NOTE   Subjective/Complaints:  Patient feels well.  She feels like she is making progress.  She denies any significant pain.  Objective:  Physical Exam: Vital Signs Blood pressure (!) 119/56, pulse 71, temperature 98.5 F (36.9 C), temperature source Oral, resp. rate 16, height 5' 9.5" (1.765 m), weight 93 kg, SpO2 99 %.  Elderly, well-developed well-nourished female in no acute distress. HEENT exam atraumatic, normocephalic, extraocular muscles are intact. Neck is supple. No jugular venous distention no thyromegaly. Chest clear to auscultation without increased work of breathing. Cardiac exam S1 and S2 are regular. Abdominal exam active bowel sounds, soft, nontender. Extremities no edema. Neurologic exam she is alert   Assessment/Plan: 1. Functional deficits secondary to L1-L3 laninotomy/PLIF  Medical Problem List and Plan: 1.Decreased functional mobilitysecondary to L2-3 spondylolisthesis, L1-2 and 2-3 spinal stenosis compressing L2 and III nerve roots with lumbar radiculopathy. Status post L2-3 and L1-2 laminotomy foraminotomies posterior lumbar interbody fusion 06/05/2019. Continue inpatient rehab. 2. Antithrombotics: -DVT/anticoagulation:SCDs.   Vascular study negative for DVT on 8/11 -antiplatelet therapy: N/A 3. Pain Management:Well-controlled. 4. Mood:Rizadyne16 mg daily  5. Neuropsych: This patientiscapable of making decisions on her own behalf. 6. Skin/Wound Care:Routine skin checks  8/17- lumbar incision healing well 7. Fluids/Electrolytes/Nutrition:Routine in and outs  Basic Metabolic Panel:    Component Value Date/Time   NA 141 06/17/2019 0928   NA 142 05/22/2017 1437   K 4.7 06/17/2019 0928   CL 114 (H) 06/17/2019 0928   CO2 19 (L) 06/17/2019 0928   BUN 32 (H) 06/17/2019 0928   BUN 18 05/22/2017 1437   CREATININE 1.22 (H) 06/17/2019 0928   GLUCOSE 103 (H) 06/17/2019  0928   CALCIUM 7.0 (L) 06/17/2019 0928   8. ID.  Completed cephalexin for serosanguineous drainage from the back wound. 9.Acute on chronic anemia. Continue iron supplement CBC:    Component Value Date/Time   WBC 7.9 06/17/2019 1005   HGB 8.1 (L) 06/17/2019 1005   HCT 24.8 (L) 06/17/2019 1005   PLT 309 06/17/2019 1005   MCV 90.8 06/17/2019 1005   NEUTROABS 5.0 06/11/2019 0603   LYMPHSABS 1.3 06/11/2019 0603   MONOABS 1.0 06/11/2019 0603   EOSABS 0.2 06/11/2019 0603   BASOSABS 0.0 06/11/2019 0603     10.Diabetes mellitus. Hemoglobin A1c 6.5.  Adequate control. CBG (last 3)  Recent Labs    06/22/19 1657 06/22/19 2057 06/23/19 0641  GLUCAP 185* 98 132*   Variable control.  Continue current therapy. 11.Hypertension. Avapro 150 mg daily, HCTZ 12.5 mg daily, Norvasc 10 mg daily. Monitor with increased mobility   Vitals:   06/22/19 1957 06/23/19 0426  BP: 116/61 (!) 119/56  Pulse: 78 71  Resp: 17 16  Temp: 98.6 F (37 C) 98.5 F (36.9 C)  SpO2: 100% 99%  We will continue to monitor. 12.CKD stage II.  Lab Results  Component Value Date   CREATININE 1.22 (H) 06/17/2019    13. Hyperlipidemia. Crestor 14. Constipation.  Resolved.   92. Dispo- home with daughter  8/19- team conference today- d/c 9/1     LOS: 13 days A FACE TO FACE EVALUATION WAS PERFORMED  Bruce H Swords 06/23/2019, 9:26 AM

## 2019-06-23 NOTE — Progress Notes (Signed)
Occupational Therapy Session Note  Patient Details  Name: [name] Mathews MRN: [id number] Date of Birth: 02/16/1934  Today's Date: 06/23/2019 OT Individual Time: 0300-9233 OT Individual Time Calculation (min): 71 min    Short Term Goals: Week 2:  OT Short Term Goal 1 (Week 2): Pt will complete LB dressing with max assist of one caregiver OT Short Term Goal 2 (Week 2): Pt will don LSO with mod assist OT Short Term Goal 3 (Week 2): Pt will complete sit > stand for LB dressing with mod assist consistently OT Short Term Goal 4 (Week 2): Pt will complete toilet transfer with mod assist  Skilled Therapeutic Interventions/Progress Updates:    Pt seen for OT session focusing on ADL re-training and functional mobility. Pt sitting upright in bed eating breakfast with NT present providing supervision, hand off to OT.  Pt agreeable to tx session and denying pain. She declined bathing this session, opting just to to complete dressing task. She transferred to sitting EOB with min A using hospital bed functions with multi-modal cuing for sequencing/technique. She donned shirt seated EOB with assist required for clothing orientation. Significantly increased time required for all aspects of each ADL due to impaired processing and requiring max multi-modal cuing for initiation of most tasks.  She then voiced need for toileting task. Min-mod A to stand from EOB with RW. She ambulated into bathroom with min A, VCs for RW management in functional context. Hygiene completed from standing position with steadying assist.  She returned to w/c to dress LB. Used reacher to doff B socks with min A, max multi-modal cuing and significantly increased time to initiate. Mod A to thread each LE into pants, pt with limited recall and ability to use reacher to assist with threading pants. Upon standing to pull up pants, pt voiced need to urinate again. Ambulated into bathroom with CGA using RW. Total A for clothing management 2/2  urgency. She stood to complete hygiene and pull pants up with CGA. Seated rest break required before ambulating out of bathroom to w/c. She completed grooming tasks from w/c level at sink with set-up assist. Pt left seated in w/c at end of session, chair belt alarm donned and all needs in reach.   Therapy Documentation Precautions:  Precautions Precautions: Fall, Back Required Braces or Orthoses: Spinal Brace Spinal Brace: Lumbar corset, Applied in sitting position Restrictions Weight Bearing Restrictions: No  Therapy/Group: Individual Therapy  [name] L 06/23/2019, 6:48 AM

## 2019-06-23 NOTE — Progress Notes (Signed)
Physical Therapy Session Note  Patient Details  Name: [name] Mathews MRN: [id number] Date of Birth: [birth year]  Today's Date: 06/23/2019 PT Individual Time: 1545-1640 PT Individual Time Calculation (min): 55 min   Short Term Goals: Week 2:  PT Short Term Goal 1 (Week 2): Pt will perform sit>stands w/ mod assist consistently PT Short Term Goal 2 (Week 2): Pt will perform bed mobility w/ mod assist PT Short Term Goal 3 (Week 2): Pt will initiate functional tasks w/ mod verbal/tactile cues 50% of the time PT Short Term Goal 4 (Week 2): Pt will initiate stair training  Skilled Therapeutic Interventions/Progress Updates:    Pt supine in bed upon PT arrival, agreeable to therapy tx and denies pain. Pt transferred to sitting EOB with mod assist and cues for techniques, use of bedrail. PT performed sit<>stand from EOB with mod assist and cues for techniques, stand pivot to w/c with RW and min assist. Pt worked on ambulation and activity tolerance this session while walking x40 ft and x 65 ft with RW and CGA-min assist, pt requires mod assist for boost up to standing position. PT worked on LE strength in order to perform x 5 sit<>stands this session from elevated mat with RW, min assist overall with cues for hand placement. Pt worked on standing balance and standing activity tolerance while performing horseshoe toss activity x 2 trials, single UE support on the RW with CGA. Pt worked on LE strengthening in order to perform 2 x 10 standing marches in place with UE support on RW, CGA and cues for techniques. Pt ambulated x122 ft back to her room with RW and CGA working on gait and endurance, cues for upright posture. Pt left seated in w/c at end of session with needs in reach and chair alarm set.   Therapy Documentation Precautions:  Precautions Precautions: Fall, Back Required Braces or Orthoses: Spinal Brace Spinal Brace: Lumbar corset, Applied in sitting position Restrictions Weight Bearing  Restrictions: No    Therapy/Group: Individual Therapy  Netta Corrigan, PT, DPT 06/23/2019, 7:07 AM

## 2019-06-23 NOTE — Progress Notes (Signed)
Pt. Urine has a bad strong odor,the urine is yellow and clear.

## 2019-06-24 ENCOUNTER — Inpatient Hospital Stay (HOSPITAL_COMMUNITY): Payer: Medicare Other

## 2019-06-24 ENCOUNTER — Inpatient Hospital Stay (HOSPITAL_COMMUNITY): Payer: Medicare Other | Admitting: Occupational Therapy

## 2019-06-24 LAB — GLUCOSE, CAPILLARY
Glucose-Capillary: 131 mg/dL — ABNORMAL HIGH (ref 70–99)
Glucose-Capillary: 145 mg/dL — ABNORMAL HIGH (ref 70–99)
Glucose-Capillary: 201 mg/dL — ABNORMAL HIGH (ref 70–99)
Glucose-Capillary: 131 mg/dL — ABNORMAL HIGH (ref 70–99)

## 2019-06-24 MED ORDER — CALCIUM CARBONATE 1250 (500 CA) MG PO TABS
1.0000 | ORAL_TABLET | Freq: Every day | ORAL | Status: DC
  Administered 2019-06-25 – 2019-06-29 (×5): 500 mg via ORAL
  Filled 2019-06-24 (×5): qty 1

## 2019-06-24 NOTE — Progress Notes (Signed)
[facility] PHYSICAL MEDICINE & REHABILITATION PROGRESS NOTE   Subjective/Complaints:  Pt reports doing well- "per NT, falling asleep while feeding herself"; pt got snappy and said "don't tell on me" to NT, in a funny way.  LBM 2 days ago per pt, actually overnight early Sunday AM- daughter asking for Metformin to be held along with senokot and Linzess- don\'t feel this is appropriate, because pt has had 1 recorded stool over entire weekend and it was the first stool since last Monday, so 6 days since previous recorded stool (which pt agreed with). Will ask nursing NOT to hold these meds- will discuss with daughter/have PA talk with daughter, depending on timing.    Objective:  Physical Exam: Vital Signs Blood pressure (!) 114/47, pulse 69, temperature 98.1 F (36.7 C), temperature source Oral, resp. rate 17, height 5\' 9.5" (1.765 m), weight 93 kg, SpO2 98 %.  Elderly, female sitting up in bed, NT beside her, trying to feed self breakfast, drifting off occ, needs cues, NAD  HEENT exam atraumatic, normocephalic, extraocular muscles are intact. Neck is supple. No jugular venous distention no thyromegaly.  Chest clear to auscultation B/L  Cardiac RRR  Abdominal exam active bowel sounds, soft, nontender. Slightly protuberant/distended, but active BS  Extremities no edema.  Neurologic exam she is alert, Ox2- more initiation.   Assessment/Plan: 1. Functional deficits secondary to L1-L3 laninotomy/PLIF  Medical Problem List and Plan: 1.Decreased functional mobilitysecondary to L2-3 spondylolisthesis, L1-2 and 2-3 spinal stenosis compressing L2 and III nerve roots with lumbar radiculopathy. Status post L2-3 and L1-2 laminotomy foraminotomies posterior lumbar interbody fusion 06/05/2019. Continue inpatient rehab. 2. Antithrombotics: -DVT/anticoagulation:SCDs.   Vascular study negative for DVT on 8/11 -antiplatelet therapy: N/A 3. Pain Management:Well-controlled. 4.  Mood:Rizadyne16 mg daily  5. Neuropsych: This patientiscapable of making decisions on her own behalf. 6. Skin/Wound Care:Routine skin checks  8/17- lumbar incision healing well 7. Fluids/Electrolytes/Nutrition:Routine in and outs  Basic Metabolic Panel:    Component Value Date/Time   NA 141 06/17/2019 0928   NA 142 05/22/2017 1437   K 4.7 06/17/2019 0928   CL 114 (H) 06/17/2019 0928   CO2 19 (L) 06/17/2019 0928   BUN 32 (H) 06/17/2019 0928   BUN 18 05/22/2017 1437   CREATININE 1.22 (H) 06/17/2019 0928   GLUCOSE 103 (H) 06/17/2019 0928   CALCIUM 7.0 (L) 06/17/2019 0928   8. ID.  Completed cephalexin for serosanguineous drainage from the back wound. 9.Acute on chronic anemia. Continue iron supplement CBC:    Component Value Date/Time   WBC 7.9 06/17/2019 1005   HGB 8.1 (L) 06/17/2019 1005   HCT 24.8 (L) 06/17/2019 1005   PLT 309 06/17/2019 1005   MCV 90.8 06/17/2019 1005   NEUTROABS 5.0 06/11/2019 0603   LYMPHSABS 1.3 06/11/2019 0603   MONOABS 1.0 06/11/2019 0603   EOSABS 0.2 06/11/2019 0603   BASOSABS 0.0 06/11/2019 0603     10 .Diabetes mellitus. Hemoglobin A1c 6.5.  Adequate control. CBG (last 3)  Recent Labs    06/23/19 1638 06/23/19 2123 06/24/19 0631  GLUCAP 144* 120* 131*   Variable control.  Continue current therapy.  8/24- BGs better since restarted metformin- no diarrhea- had ONE loose stool, which is OK, considering no BM in 6 days. 11.Hypertension. Avapro 150 mg daily, HCTZ 12.5 mg daily, Norvasc 10 mg daily. Monitor with increased mobility   Vitals:   06/23/19 2016 06/24/19 0556  BP: (!) 106/53 (!) 114/47  Pulse: 69 69  Resp: 16 17  Temp: 98  F (36.7 C) 98.1 F (36.7 C)  SpO2: 99% 98%  We will continue to monitor. 12.CKD stage II.  Lab Results  Component Value Date   CREATININE 1.22 (H) 06/17/2019    13. Hyperlipidemia. Crestor 14. Constipation  8/24- pt's LBM Sun early AM- daughter wants meds held,  which is not appropriate at this time- will have team d/w daughter.  15. Hypocalcemia  8/24- Ca 7.0- will add Ca Carbonate 1x/day 15. Dispo- home with daughter  8/19- team conference today- d/c 9/1  8/24- will recheck labs in AM     LOS: 14 days A FACE TO FACE EVALUATION WAS PERFORMED  Leevon Upperman 06/24/2019, 9:28 AM

## 2019-06-24 NOTE — Progress Notes (Signed)
Physical Therapy Session Note  Patient Details  Name: [name] Mathews MRN: [id number] Date of Birth: [birth year]  Today's Date: 06/24/2019 PT Individual Time: 1100-1145 PT Individual Time Calculation (min): 45 min   Short Term Goals: Week 2:  PT Short Term Goal 1 (Week 2): Pt will perform sit>stands w/ mod assist consistently PT Short Term Goal 2 (Week 2): Pt will perform bed mobility w/ mod assist PT Short Term Goal 3 (Week 2): Pt will initiate functional tasks w/ mod verbal/tactile cues 50% of the time PT Short Term Goal 4 (Week 2): Pt will initiate stair training  Skilled Therapeutic Interventions/Progress Updates:    extra time to get started due to fatigue and pt requiring encouragment. Performed sit <> stands throughout session with supervision with verbal cues for hand placement and technique with RW, one episode of CGA to facilitate initiation of anterior weightshift.  Pt able to gait x 140' x 2 during session with close supervision with RW to therapy gym with cues for upright posture and RW positioning. Stair negotiation training for functional strengthening x 4 steps with rails with CGA. Dynamic standing balance activity with 1 UE support on RW reaching outside BOS with close supervision/CGA.   [name] arrived towards end of session and observed last transfer and gait back to room with RW. Discussed progress and overall functional level and improvements. Discussed potentially moving up d/c date with discussion of rest of therapy team and daughter receptive. Planning for her to come in tomorrow morning for AM therapies to see full OT and PT sessions.   Therapy Documentation Precautions:  Precautions Precautions: Fall, Back Required Braces or Orthoses: Spinal Brace Spinal Brace: Lumbar corset, Applied in sitting position Restrictions Weight Bearing Restrictions: No General:   Pain: No reports of pain.    Therapy/Group: Individual Therapy  [name], PT, DPT, CBIS  06/24/2019, 12:01 PM

## 2019-06-24 NOTE — Progress Notes (Signed)
Occupational Therapy Session Note  Patient Details  Name: [name] Mathews MRN: [id number] Date of Birth: 1934-07-07  Today's Date: 06/24/2019 OT Individual Time: 1432-1456 OT Individual Time Calculation (min): 24 min    Short Term Goals: Week 2:  OT Short Term Goal 1 (Week 2): Pt will complete LB dressing with max assist of one caregiver OT Short Term Goal 2 (Week 2): Pt will don LSO with mod assist OT Short Term Goal 3 (Week 2): Pt will complete sit > stand for LB dressing with mod assist consistently OT Short Term Goal 4 (Week 2): Pt will complete toilet transfer with mod assist  Skilled Therapeutic Interventions/Progress Updates:    Treatment session with focus on arousal and functional mobility.  Pt received in recliner, reporting cold and tired. Engaged in blocked practice sit > stand from recliner with mod cues for hand placement as pt with no carryover despite repetition.  Pt overall min assist from recliner this session.  Ambulated to bed with RW with CGA.  Sit > sidelying with min assist and left in sidelying with all needs in reach.  Therapy Documentation Precautions:  Precautions Precautions: Fall, Back Required Braces or Orthoses: Spinal Brace Spinal Brace: Lumbar corset, Applied in sitting position Restrictions Weight Bearing Restrictions: No Vital Signs: Therapy Vitals Temp: 98.4 F (36.9 C) Temp Source: Oral Pulse Rate: 71 Resp: 17 BP: (!) 108/49 Patient Position (if appropriate): Sitting Oxygen Therapy SpO2: 100 % O2 Device: Room Air Pain:  Pt with no c/o pain   Therapy/Group: Individual Therapy  Simonne Come 06/24/2019, 3:02 PM

## 2019-06-24 NOTE — Plan of Care (Signed)
  Problem: RH Wheelchair Mobility Goal: LTG Patient will propel w/c in controlled environment (PT) Description: LTG: Patient will propel wheelchair in controlled environment, # of feet with assist (PT) Outcome: Not Applicable Flowsheets (Taken 06/24/2019 1523) LTG: Pt will propel w/c in controlled environ  assist needed:: (D/c) -- Note: D/c   Problem: RH Balance Goal: LTG Patient will maintain dynamic standing balance (PT) Description: LTG:  Patient will maintain dynamic standing balance with assistance during mobility activities (PT) Flowsheets (Taken 06/24/2019 1523) LTG: Pt will maintain dynamic standing balance during mobility activities with:: (upgraded due to progressABG) Contact Guard/Touching assist Note: upgraded due to progressABG   Problem: RH Ambulation Goal: LTG Patient will ambulate in controlled environment (PT) Description: LTG: Patient will ambulate in a controlled environment, # of feet with assistance (PT). Flowsheets Taken 06/24/2019 1523 by Canary Brim B, PT LTG: Pt will ambulate in controlled environ  assist needed:: (upgraded due to progressABG) Contact Guard/Touching assist Taken 06/11/2019 1006 by Kennith Gain, PT LTG: Ambulation distance in controlled environment: 50 Note: upgraded due to progressABG Goal: LTG Patient will ambulate in home environment (PT) Description: LTG: Patient will ambulate in home environment, # of feet with assistance (PT). Flowsheets Taken 06/24/2019 1523 by Canary Brim B, PT LTG: Pt will ambulate in home environ  assist needed:: (upgraded due to progressABG) Contact Guard/Touching assist Taken 06/11/2019 1006 by Kennith Gain, PT LTG: Ambulation distance in home environment: 25 Note: upgraded due to progressABG

## 2019-06-24 NOTE — Progress Notes (Signed)
Occupational Therapy Session Note  Patient Details  Name: [name] Mathews MRN: [id number] Date of Birth: October 26, 1934  Today's Date: 06/24/2019 OT Individual Time: 4854-6270 OT Individual Time Calculation (min): 57 min    Short Term Goals: Week 2:  OT Short Term Goal 1 (Week 2): Pt will complete LB dressing with max assist of one caregiver OT Short Term Goal 2 (Week 2): Pt will don LSO with mod assist OT Short Term Goal 3 (Week 2): Pt will complete sit > stand for LB dressing with mod assist consistently OT Short Term Goal 4 (Week 2): Pt will complete toilet transfer with mod assist  Skilled Therapeutic Interventions/Progress Updates:    Treatment session with focus on self-care retraining and functional mobility.  Pt received supine in bed, easily aroused to name.  Pt completed rolling in bed with min guard and mod cues for sequencing, ultimately requiring mod assist to come to sitting at EOB with max cues. Sit > stand with RW with mod assist and ambulated to toilet with RW with CGA.  Pt completed clothing management and hygiene with mod cues to complete.  Ambulated to sink, completed bathing at sit > stand level at sink with min assist for all sit > stands from w/c level.  Mod assist LB dressing with max multimodal cues and hand over hand to sequence use of reacher.  Pt able to pull incontinence brief and pants over hips with min guard in standing. Ambulated to recliner with RW with min assist sit > stand and CGA during ambulation and transfer - noted improved management of RW this session.  Pt left upright in recliner with all needs in reach and seat belt alarm on.  Therapy Documentation Precautions:  Precautions Precautions: Fall, Back Required Braces or Orthoses: Spinal Brace Spinal Brace: Lumbar corset, Applied in sitting position Restrictions Weight Bearing Restrictions: No Pain:  Pt with no c/o pain   Therapy/Group: Individual Therapy  Simonne Come 06/24/2019, 10:59 AM

## 2019-06-24 NOTE — Progress Notes (Signed)
Patient's daughter [name]) called in to request that Linzess and Senokot be held. Also wants metformin to resceduled to a later time due to it causing her mother diarrhea. Daughter says she would like to speak to provider today concerning medications and will be here for her mother's therapy.

## 2019-06-25 ENCOUNTER — Ambulatory Visit (HOSPITAL_COMMUNITY): Payer: Medicare Other

## 2019-06-25 ENCOUNTER — Inpatient Hospital Stay (HOSPITAL_COMMUNITY): Payer: Medicare Other | Admitting: Occupational Therapy

## 2019-06-25 ENCOUNTER — Encounter (HOSPITAL_COMMUNITY): Payer: Self-pay | Admitting: Neurosurgery

## 2019-06-25 ENCOUNTER — Encounter (HOSPITAL_COMMUNITY): Payer: Medicare Other | Admitting: Occupational Therapy

## 2019-06-25 LAB — COMPREHENSIVE METABOLIC PANEL
ALT: 8 U/L (ref 0–44)
AST: 13 U/L — ABNORMAL LOW (ref 15–41)
Albumin: 3.1 g/dL — ABNORMAL LOW (ref 3.5–5.0)
Alkaline Phosphatase: 76 U/L (ref 38–126)
Anion gap: 9 (ref 5–15)
BUN: 57 mg/dL — ABNORMAL HIGH (ref 8–23)
CO2: 24 mmol/L (ref 22–32)
Calcium: 8.8 mg/dL — ABNORMAL LOW (ref 8.9–10.3)
Chloride: 102 mmol/L (ref 98–111)
Creatinine, Ser: 1.97 mg/dL — ABNORMAL HIGH (ref 0.44–1.00)
GFR calc Af Amer: 27 mL/min — ABNORMAL LOW (ref >60)
GFR calc non Af Amer: 23 mL/min — ABNORMAL LOW (ref >60)
Glucose, Bld: 142 mg/dL — ABNORMAL HIGH (ref 70–99)
Potassium: 4.2 mmol/L (ref 3.5–5.1)
Sodium: 135 mmol/L (ref 135–145)
Total Bilirubin: 0.4 mg/dL (ref 0.3–1.2)
Total Protein: 6.6 g/dL (ref 6.5–8.1)

## 2019-06-25 LAB — GLUCOSE, CAPILLARY
Glucose-Capillary: 134 mg/dL — ABNORMAL HIGH (ref 70–99)
Glucose-Capillary: 180 mg/dL — ABNORMAL HIGH (ref 70–99)
Glucose-Capillary: 164 mg/dL — ABNORMAL HIGH (ref 70–99)
Glucose-Capillary: 135 mg/dL — ABNORMAL HIGH (ref 70–99)

## 2019-06-25 LAB — CBC WITH DIFFERENTIAL/PLATELET
Abs Immature Granulocytes: 0.01 10*3/uL (ref 0.00–0.07)
Basophils Absolute: 0 10*3/uL (ref 0.0–0.1)
Basophils Relative: 1 %
Eosinophils Absolute: 0.3 10*3/uL (ref 0.0–0.5)
Eosinophils Relative: 6 %
HCT: 25 % — ABNORMAL LOW (ref 36.0–46.0)
Hemoglobin: 8.2 g/dL — ABNORMAL LOW (ref 12.0–15.0)
Immature Granulocytes: 0 %
Lymphocytes Relative: 33 %
Lymphs Abs: 1.9 10*3/uL (ref 0.7–4.0)
MCH: 29.2 pg (ref 26.0–34.0)
MCHC: 32.8 g/dL (ref 30.0–36.0)
MCV: 89 fL (ref 80.0–100.0)
Monocytes Absolute: 0.4 10*3/uL (ref 0.1–1.0)
Monocytes Relative: 8 %
Neutro Abs: 3 10*3/uL (ref 1.7–7.7)
Neutrophils Relative %: 52 %
Platelets: 262 10*3/uL (ref 150–400)
RBC: 2.81 MIL/uL — ABNORMAL LOW (ref 3.87–5.11)
RDW: 14 % (ref 11.5–15.5)
WBC: 5.6 10*3/uL (ref 4.0–10.5)
nRBC: 0 % (ref 0.0–0.2)

## 2019-06-25 MED ORDER — AMLODIPINE BESYLATE 5 MG PO TABS: 5.0000 mg | ORAL_TABLET | Freq: Every day | ORAL | Status: DC

## 2019-06-25 MED ORDER — IRBESARTAN 300 MG PO TABS: 150.0000 mg | ORAL_TABLET | Freq: Every day | ORAL | Status: DC

## 2019-06-25 MED ORDER — SODIUM CHLORIDE 0.9 % NICU IV INFUSION SIMPLE
INJECTION | INTRAVENOUS | Status: DC
  Filled 2019-06-25 (×3): qty 500

## 2019-06-25 MED ORDER — HYDROCHLOROTHIAZIDE 12.5 MG PO CAPS: 12.5000 mg | ORAL_CAPSULE | Freq: Every day | ORAL | Status: DC

## 2019-06-25 MED ORDER — SODIUM CHLORIDE 0.9 % IV SOLN
INTRAVENOUS | Status: DC
  Administered 2019-06-25 – 2019-06-26 (×2): via INTRAVENOUS

## 2019-06-25 NOTE — Progress Notes (Signed)
[facility] PHYSICAL MEDICINE & REHABILITATION PROGRESS NOTE   Subjective/Complaints:  Pt reports doing well- says her last BM was 2 days ago- according to chart, last BM was Sunday AM-  Said TV was watching her, when asked what she was watching!  Has family education this AM per staff, so eating breakfast early.  Objective:  Physical Exam: Vital Signs Blood pressure (!) 116/57, pulse 71, temperature 98.9 F (37.2 C), temperature source Oral, resp. rate 18, height 5\' 9.5" (1.765 m), weight 93 kg, SpO2 100 %.  Elderly, female sitting up in bed, NT beside her, feeding herself breakfast, drifting off occ, needs cues, NAD  HEENT exam atraumatic, normocephalic, extraocular muscles are intact. Wearing glasses  Neck is supple. No jugular venous distention no thyromegaly.  Chest clear to auscultation B/L  Cardiac RRR  Abdominal exam active slightly hypoactive bowel sounds, soft, nontender. Slightly protuberant/distended, but active BS  Extremities no edema.  Neurologic exam she is alert, Ox2- more initiation.   Assessment/Plan: 1. Functional deficits secondary to L1-L3 laninotomy/PLIF  Medical Problem List and Plan: 1.Decreased functional mobilitysecondary to L2-3 spondylolisthesis, L1-2 and 2-3 spinal stenosis compressing L2 and III nerve roots with lumbar radiculopathy. Status post L2-3 and L1-2 laminotomy foraminotomies posterior lumbar interbody fusion 06/05/2019. Continue inpatient rehab. 2. Antithrombotics: -DVT/anticoagulation:SCDs.   Vascular study negative for DVT on 8/11 -antiplatelet therapy: N/A 3. Pain Management:Well-controlled. 4. Mood:Rizadyne16 mg daily  5. Neuropsych: This patientiscapable of making decisions on her own behalf. 6. Skin/Wound Care:Routine skin checks  8/17- lumbar incision healing well 7. Fluids/Electrolytes/Nutrition:Routine in and outs  Basic Metabolic Panel:    Component Value Date/Time   NA 135 06/25/2019  0629   NA 142 05/22/2017 1437   K 4.2 06/25/2019 0629   CL 102 06/25/2019 0629   CO2 24 06/25/2019 0629   BUN 57 (H) 06/25/2019 0629   BUN 18 05/22/2017 1437   CREATININE 1.97 (H) 06/25/2019 0629   GLUCOSE 142 (H) 06/25/2019 0629   CALCIUM 8.8 (L) 06/25/2019 0629   8. ID.  Completed cephalexin for serosanguineous drainage from the back wound. 9.Acute on chronic anemia. Continue iron supplement CBC:    Component Value Date/Time   WBC 5.6 06/25/2019 0629   HGB 8.2 (L) 06/25/2019 0629   HCT 25.0 (L) 06/25/2019 0629   PLT 262 06/25/2019 0629   MCV 89.0 06/25/2019 0629   NEUTROABS 3.0 06/25/2019 0629   LYMPHSABS 1.9 06/25/2019 0629   MONOABS 0.4 06/25/2019 0629   EOSABS 0.3 06/25/2019 0629   BASOSABS 0.0 06/25/2019 0629     10 .Diabetes mellitus. Hemoglobin A1c 6.5.  Adequate control. CBG (last 3)  Recent Labs    06/24/19 1644 06/24/19 2115 06/25/19 0602  GLUCAP 201* 131* 134*   Variable control.  Continue current therapy.  8/24- BGs better since restarted metformin- no diarrhea- had ONE loose stool, which is OK, considering no BM in 6 days.  8/25- will d/c metformin since Cr has bumped to 1.97 and BUN 57 11.Hypertension. Avapro 150 mg daily, HCTZ 12.5 mg daily, Norvasc 10 mg daily. Monitor with increased mobility  8/25- BP on low side- probably not drinking enough, and then getting HCTZ.  Vitals:   06/24/19 1952 06/25/19 0603  BP: (!) 112/58 (!) 116/57  Pulse: 74 71  Resp: 18 18  Temp: 98.7 F (37.1 C) 98.9 F (37.2 C)  SpO2: 99% 100%  We will continue to monitor. 12.CKD stage II.  Lab Results  Component Value Date   CREATININE 1.97 (H) 06/25/2019  8/25- will give IVFs 80cc/hr x 2 4 hrs and recheck- will hold Metformin and hold Norvasc/Avapro tomorrow as well. 13. Hyperlipidemia. Crestor 14. Constipation  8/24- pt's LBM Sun early AM- daughter wants meds held, which is not appropriate at this time- will have team d/w daughter.  15.  Hypocalcemia  8/24- Ca 7.0- will add Ca Carbonate 1x/day 15. Dispo- home with daughter  8/19- team conference today- d/c 9/1  8/24- will recheck labs in AM     LOS: 15 days A FACE TO FACE EVALUATION WAS PERFORMED  Niklas Chretien 06/25/2019, 8:59 AM

## 2019-06-25 NOTE — Progress Notes (Signed)
Occupational Therapy Note  Patient Details  Name: [name] Mathews MRN: [id number] Date of Birth: 1934/09/18  Today's Date: 06/25/2019 OT Missed Time: 76 Minutes Missed Time Reason: Nursing care;Patient unwilling/refused to participate without medical reason  Pt missed 30 mins scheduled OT treatment session due to RN present starting IV fluids due to dehydration.  Pt having just returned to bed and still with decreased alertness.  Pt declining any activity at this time.  Pt left in supine with RN present to finish setting up IV.  Simonne Come 06/25/2019, 2:30 PM

## 2019-06-25 NOTE — Progress Notes (Signed)
Occupational Therapy Weekly Progress Note  Patient Details  Name: [name] Mathews MRN: [id number] Date of Birth: 1934-02-14  Beginning of progress report period: June 18, 2019 End of progress report period: June 25, 2019  Today's Date: 06/25/2019 OT Individual Time: 1000-1100 OT Individual Time Calculation (min): 60 min    Patient has met 3 of 4 short term goals.  Pt is making steady progress towards goals.  Pt is overall min-mod assist for basic self-care tasks.  Pt is able to complete bathing and UB dressing with min assist, however requires increased assistance with LB dressing due to back precautions and limited cognitive flexibility to incorporate use of AE to complete LB dressing while adhering to back precautions.  Once pants on thread on to lower legs pt is able to manage clothing over hips.  Pt has demonstrated improvements with sit> stand from various surfaces, often requiring increased time and cues for initiation.  Pt is demonstrating decreased motivation and often reports fatigue - limiting participation and progress towards goals.    Patient continues to demonstrate the following deficits: muscle weakness,impaired timing and sequencing and unbalanced muscle activation,decreased attention, decreased awareness, decreased safety awareness, decreased memory and delayed processingand decreased standing balance and decreased balance strategies and therefore will continue to benefit from skilled OT intervention to enhance overall performance with BADL and Reduce care partner burden.  Patient progressing toward long term goals..  Continue plan of care.  OT Short Term Goals Week 2:  OT Short Term Goal 1 (Week 2): Pt will complete LB dressing with max assist of one caregiver OT Short Term Goal 1 - Progress (Week 2): Met OT Short Term Goal 2 (Week 2): Pt will don LSO with mod assist OT Short Term Goal 2 - Progress (Week 2): Not progressing OT Short Term Goal 3 (Week 2): Pt will  complete sit > stand for LB dressing with mod assist consistently OT Short Term Goal 3 - Progress (Week 2): Met OT Short Term Goal 4 (Week 2): Pt will complete toilet transfer with mod assist OT Short Term Goal 4 - Progress (Week 2): Met Week 3:  OT Short Term Goal 1 (Week 3): STG = LTGs due to remaining LOS  Skilled Therapeutic Interventions/Progress Updates:    Treatment session with focus on initiation and participation in self-care tasks.  Pt received upright in recliner reporting fatigue and reluctantly agreeable to therapy session.  Pt declined bathing or dressing, stating "didn't we already do that".  Engaged in education on use of AE (reacher and long handled shoe horn) for LB dressing, pt still requiring hand over hand assist to utilize due to limited mental flexibility.  Pt reports need to toilet.  Ambulated to toilet with RW with min assist sit > stand and CGA for mobility.  Noted pt with improved management of RW during mobility this session. Pt completed hygiene and clothing management with CGA with RW.  Min cues for hand placement to increase safety.   [name] arrived during session.  Discussed pt progress towards goals and limitations impeding progress.  Daughter in agreement with plan and plans to return for therapy sessions tomorrow for hands on education in preparation for d/c.  Therapy Documentation Precautions:  Precautions Precautions: Fall, Back Required Braces or Orthoses: Spinal Brace Spinal Brace: Lumbar corset, Applied in sitting position Restrictions Weight Bearing Restrictions: No Pain: Pain Assessment Pain Scale: 0-10 Pain Score: 0-No pain   Therapy/Group: Individual Therapy  Simonne Come 06/25/2019, 11:37 AM

## 2019-06-25 NOTE — Plan of Care (Signed)
  Problem: RH Dressing Goal: LTG Patient will perform lower body dressing w/assist (OT) Description: LTG: Patient will perform lower body dressing with assist, with/without cues in positioning using equipment (OT) Flowsheets (Taken 06/25/2019 1025) LTG: Pt will perform lower body dressing with assistance level of: (downgraded due to minimal progress) Moderate Assistance - Patient 50 - 74% Note: Downgraded due to minimal progress, impacted by cognition

## 2019-06-25 NOTE — Progress Notes (Signed)
Physical Therapy Session Note  Patient Details  Name: [name] Mathews MRN: [id number] Date of Birth: 1934-08-25  Today's Date: 06/25/2019 PT Individual Time: 0800-0845 PT Individual Time Calculation (min): 45 min   Short Term Goals: Week 2:  PT Short Term Goal 1 (Week 2): Pt will perform sit>stands w/ mod assist consistently PT Short Term Goal 2 (Week 2): Pt will perform bed mobility w/ mod assist PT Short Term Goal 3 (Week 2): Pt will initiate functional tasks w/ mod verbal/tactile cues 50% of the time PT Short Term Goal 4 (Week 2): Pt will initiate stair training  Skilled Therapeutic Interventions/Progress Updates:    pt presents in bed, agreeable to therapy session with minimal encouragement needed. Very slow and flat affect this morning. Repeated states "I am just tired." Extra time and mod verbal cues for technique to come to EOB from flat bed requiring min facilitation at trunk to complete propped on elbow to sit due to poor initiation and follow through. Extra time and verbal cues for hand placement for sit <> stand with supervision from EOB and close supervision gait into bathroom with RW (pt request to use bathroom) and improved ability to steer without intervention needed. Cues for sequencing of clothing management, hygiene, and transfers for toileting but able to perform with CGA/supervision. Pt with incontinent void in brief but nothing further on toilet. Pt requires frequent cueing to complete a task as she just stops and stares off (eyes maintained open today though). Pt attempts oral hygiene at sink in standing but then requests to sit due to being tired. With extra time and min cues, able to engage at w/c level and complete task. Short distance gait in room x 15' to tolerance with RW and then transferred to recliner with safety belt in tact and RN present.   Therapy Documentation Precautions:  Precautions Precautions: Fall, Back Required Braces or Orthoses: Spinal Brace Spinal  Brace: Lumbar corset, Applied in sitting position Restrictions Weight Bearing Restrictions: No Pain: Denies pain.   Therapy/Group: Individual Therapy  [name], PT, DPT, CBIS  06/25/2019, 10:03 AM

## 2019-06-26 ENCOUNTER — Ambulatory Visit (HOSPITAL_COMMUNITY): Payer: Medicare Other

## 2019-06-26 ENCOUNTER — Encounter (HOSPITAL_COMMUNITY): Payer: Medicare Other | Admitting: Psychology

## 2019-06-26 ENCOUNTER — Ambulatory Visit (HOSPITAL_COMMUNITY): Payer: Medicare Other | Admitting: Physical Therapy

## 2019-06-26 ENCOUNTER — Encounter (HOSPITAL_COMMUNITY): Payer: Medicare Other | Admitting: Occupational Therapy

## 2019-06-26 ENCOUNTER — Inpatient Hospital Stay (HOSPITAL_COMMUNITY): Payer: Medicare Other

## 2019-06-26 DIAGNOSIS — I679 Cerebrovascular disease, unspecified: Secondary | ICD-10-CM

## 2019-06-26 LAB — BASIC METABOLIC PANEL
Anion gap: 10 (ref 5–15)
BUN: 51 mg/dL — ABNORMAL HIGH (ref 8–23)
CO2: 23 mmol/L (ref 22–32)
Calcium: 8.8 mg/dL — ABNORMAL LOW (ref 8.9–10.3)
Chloride: 102 mmol/L (ref 98–111)
Creatinine, Ser: 1.67 mg/dL — ABNORMAL HIGH (ref 0.44–1.00)
GFR calc Af Amer: 32 mL/min — ABNORMAL LOW (ref >60)
GFR calc non Af Amer: 28 mL/min — ABNORMAL LOW (ref >60)
Glucose, Bld: 140 mg/dL — ABNORMAL HIGH (ref 70–99)
Potassium: 4.2 mmol/L (ref 3.5–5.1)
Sodium: 135 mmol/L (ref 135–145)

## 2019-06-26 LAB — GLUCOSE, CAPILLARY
Glucose-Capillary: 119 mg/dL — ABNORMAL HIGH (ref 70–99)
Glucose-Capillary: 205 mg/dL — ABNORMAL HIGH (ref 70–99)
Glucose-Capillary: 133 mg/dL — ABNORMAL HIGH (ref 70–99)
Glucose-Capillary: 170 mg/dL — ABNORMAL HIGH (ref 70–99)

## 2019-06-26 MED ORDER — SORBITOL 70 % SOLN
60.0000 mL | Freq: Once | Status: AC
  Administered 2019-06-26: 60 mL via ORAL
  Filled 2019-06-26: qty 60

## 2019-06-26 MED ORDER — BISACODYL 10 MG RE SUPP: 10.0000 mg | Freq: Every day | RECTAL | Status: DC | PRN

## 2019-06-26 MED ORDER — SODIUM CHLORIDE 0.9 % IV SOLN
INTRAVENOUS | Status: DC
  Administered 2019-06-26 – 2019-06-27 (×3): via INTRAVENOUS

## 2019-06-26 MED ORDER — HYDROCHLOROTHIAZIDE 12.5 MG PO CAPS
12.5000 mg | ORAL_CAPSULE | Freq: Every day | ORAL | Status: DC
  Administered 2019-06-27: 12.5 mg via ORAL
  Filled 2019-06-26: qty 1

## 2019-06-26 MED ORDER — AMLODIPINE BESYLATE 5 MG PO TABS
5.0000 mg | ORAL_TABLET | Freq: Every day | ORAL | Status: DC
  Filled 2019-06-26: qty 1

## 2019-06-26 MED ORDER — IRBESARTAN 300 MG PO TABS: 150.0000 mg | ORAL_TABLET | Freq: Every day | ORAL | Status: DC

## 2019-06-26 NOTE — Patient Care Conference (Signed)
Inpatient RehabilitationTeam Conference and Plan of Care Update Date: 06/26/2019   Time: 9:50 AM    Patient Name: [name] Mathews      Medical Record Number: [id number]  Date of Birth: Oct 03, 1934 Sex: Female         Room/Bed: 4M12C/4M12C-01 Payor Info: Payor: MEDICARE / Plan: MEDICARE PART A AND B / Product Type: *No Product type* /    Admitting Diagnosis: 3. SCI Team Other [place], Lumbar spondylolisthesis; 13-15days  Admit Date/Time:  06/10/2019  3:15 PM Admission Comments: No comment available   Primary Diagnosis:  <principal problem not specified> Principal Problem: <principal problem not specified>  Patient Active Problem List   Diagnosis Date Noted  . Postoperative confusion   . Drug induced constipation   . CKD (chronic kidney disease), stage II   . Labile blood pressure   . Diabetes mellitus type 2 in obese ([facility])   . Labile blood glucose   . Acute on chronic anemia   . Lumbar radiculopathy 06/10/2019  . Lumbar adjacent segment disease with spondylolisthesis 06/05/2019  . Hypersomnia with long sleep time, idiopathic 01/17/2018  . Snoring 01/17/2018  . Sleep apnea with hypersomnolence 01/17/2018  . Excessive daytime sleepiness 01/17/2018  . Normochromic normocytic anemia 05/22/2017  . Radiculopathy 07/11/2012  . HTN (hypertension) 07/08/2012  . Diabetes mellitus ([facility]) 07/08/2012  . Encephalopathy acute 07/08/2012  . Tachycardia 07/08/2012  . UTI (lower urinary tract infection) 07/08/2012    Expected Discharge Date: Expected Discharge Date: 06/29/19, as long as medical issues are resolved  Team Members Present: Physician leading conference: Dr. Courtney Heys Social Worker Present: Alfonse Alpers, LCSW Nurse Present: Frances Maywood, RN PT Present: Excell Seltzer, PT OT Present: Amy Rounds, OT SLP Present: Weston Anna, SLP PPS Coordinator present : Gunnar Fusi, SLP     Current Status/Progress Goal Weekly Team Focus  Medical   pt's BUN down to 51 from 57 and Cr  1.67 dwon from 1.9- continuing IVFs and pushing PO  d/c IVFs is goal and help pt be more regular with BMs  d/c home 8/29 if possible per daughter's request   Bowel/Bladder   incontinent bowel/bladder. LBM 06/23/2019  regular toileting patterns  assess toileting needs q shift/prn   Swallow/Nutrition/ Hydration             ADL's   Setup UB bathing and dressing, mod assist LB bathing/dressing, Min assist sit> stand, CGA transfers  Min assist overall, Mod A LB dressing  activity tolerance, ADL retraining, functional mobility, family education, d/c planning   Mobility   supervision/CGA overall for bed mobility, transfers, and gait; CGA for stairs with rails; mod cues for sequencing and initiation  upgraded to CGA  fam ed, d/c planning, endurance, strengthening, balance   Communication             Safety/Cognition/ Behavioral Observations            Pain   no c/o of pain, has Tylenol prn  remain pain free  assess pain q shift/prn   Skin   abd pad mid/lower back  no new area of skin breakdown, prevent infection further breakdown  assess skin q shift/prn    Rehab Goals Patient on target to meet rehab goals: Yes Rehab Goals Revised: none *See Care Plan and progress notes for long and short-term goals.     Barriers to Discharge  Current Status/Progress Possible Resolutions Date Resolved   Physician    Incontinence;Medical stability;Lack of/limited family support;Weight bearing restrictions;Behavior;Nutrition means;Other (comments)  poor  initiation  min-max assist for ADLs; CGA to Supervison with RW/PT  improve PO intake      Nursing                  PT                    OT                  SLP                SW                Discharge Planning/Teaching Needs:  Pt plans to return to her dtr's home where she has been living.  Dtr does not want pt to go to SNF due to COVID-19.  Family education is ongoing.   Team Discussion:  Pt with elevated creatinine and BUN yesterday, but  trending down following IV fluids.  Dr. Berline ChoughLovorn is holding BP meds today.  Pt looks and reports feeling better today.  RN has educated pt on intake of more fluids.  Pt needs to have a BM, LBM was 8-23.  Dtr agrees with senokot, but does not want other bowel medications.  Pt is min A overall for UB ADLs and goals are min A overall with mod A for LB tasks.  Pt is S/CGA overall with PT with rolling walker.  Some goals have been upgraded.  Pt's d/c date was moved to 06-29-19 assuming medical issues are resolved.  Revisions to Treatment Plan:  none    Continued Need for Acute Rehabilitation Level of Care: The patient requires daily medical management by a physician with specialized training in physical medicine and rehabilitation for the following conditions: Daily direction of a multidisciplinary physical rehabilitation program to ensure safe treatment while eliciting the highest outcome that is of practical value to the patient.: Yes Daily medical management of patient stability for increased activity during participation in an intensive rehabilitation regime.: Yes Daily analysis of laboratory values and/or radiology reports with any subsequent need for medication adjustment of medical intervention for : Post surgical problems;Blood pressure problems;Renal problems;Mood/behavior problems;Nutritional problems;Other   I attest that I was present, lead the team conference, and concur with the assessment and plan of the team.Team conference was held via web/ teleconference due to COVID - 19.   Ahkeem Goede, Vista DeckJennifer Capps 06/26/2019, 1:32 PM

## 2019-06-26 NOTE — Consult Note (Signed)
Neuropsychological Consultation   Patient:   [name] Mathews   DOB:   22-Sep-1934  MR Number:  [id number]  Location:  [facility] [address] [facility] [place] [id number] [place] [address] Dept: [facility]: [phone]           Date of Service:   06/26/2019  Start Time:   1 PM End Time:   2 PM  Provider/Observer:  Ilean Skill, Psy.D.       Clinical Neuropsychologist       Billing Code/Service: [phone]  Chief Complaint:    [name] Mathews is an 85 year old female with history of diabetes, memory deficits, CKD, hyperlipidemia, hypertension, and chronic back pain.  Past history of L3-4 and 4-5  Decompression and fusion years ago.  Presented 06/05/2019 with recurrent back pain radiating to the lower extremities.  Lumbar MRI and imaging demonstrated stenosis spondylolisthesis with radiculopathy at L2-3.  Patient underwent bilateral laminotomy to decompress nerve roots as well as posterior lumbar interbody fusion.  Patient admitted for CIR after initially recovery from surgery.  Patient has had periods of confusion and last assessment patient was disoriented to year but not month or place.    06/26/2019:  Patient was essentially unchanged from first assessment related to mental status.  She was again correct for Place, the fact that she had back surgery and month but did not know year.  She could not tell me what she had for lunch even though it was still in front of her.  Visual spatial abilities and expressive language good except for severe slowed response times.  The pattern of cognitive deficits are consistent with chronic SVD and cortical atrophy.  Not likely new.  Reason for Service:  CWC:BJSE [name] Godetteis an 85 year old right-handed female history of diabetes mellitus, memory deficits, CKD stage II with creatinine baseline 1.25-1.57, hyperlipidemia hypertension, normocytic anemia as well as chronic back pain as well as  history of L3-4 and 4-5 decompression and fusion years ago. Per chart review independent with assistive device prior to admission. She lives with his daughter. Two-level level home with ramped entrance with bed and bath on main level. Patient had been receiving home health therapies for chronic back pain. Presented 06/05/2019 with recurrent back pain radiating to the lower extremities. Lumbar MRI and imaging demonstrated stenosis spondylolisthesis with radiculopathy at L2-3. Underwent bilateral L2-3 and L1-2 laminotomy foraminotomies to decompress the bilateral L2 and L3 nerve roots as well as posterior lumbar interbody fusion 06/05/2019 per Dr. Arnoldo Morale. Back brace out of bed applied in sitting position. Hospital course pain management. Initial bouts of confusion monitoring use of narcotics. Patient did develop some serosanguineous drainage from wound site placed on Keflex 06/07/2019. Acute blood loss anemia 8.4 and monitored. Therapy evaluations completed and patient was admitted for a comprehensive rehab program.  Current Status:  The patient has improved but continued cognitive deficits, some or all of which may have been there pre surgery.  Patient with slowed information processing and mild memory deficits.     Behavioral Observation: [name] Mathews  presents as a 85 y.o.-year-old Right African American Female who appeared her stated age. her dress was Appropriate and she was Well Groomed and her manners were Appropriate to the situation.  her participation was indicative of Appropriate and Inattentive behaviors.  There were any physical disabilities noted.  she displayed an appropriate level of cooperation and motivation.     Interactions:    Active  Appropriate and Inattentive  Attention:   abnormal and attention span appeared shorter than expected for age  Memory:   abnormal; remote memory intact, recent memory impaired  Visuo-spatial:  not examined  Speech  (Volume):  low  Speech:   normal; slowed response  Thought Process:  Coherent and Relevant  Though Content:  WNL; not suicidal and not homicidal  Orientation:   person, place and situation  Judgment:   Fair  Planning:   Poor  Affect:    Lethargic  Mood:    Dysphoric  Insight:   Fair  Intelligence:   normal  Medical History:   Past Medical History:  Diagnosis Date  . Anemia    hx of  . Arthritis   . Breast lump    hx of  . Cataract    left eye  . Chronic back pain   . Diabetes mellitus   . Hyperlipidemia   . Hypertension    [facility]  . Normochromic normocytic anemia 05/22/2017   Hb 10 MCV 85.6 No monoclonal spike on SPEP  Creatinine 1.3-1.6  . Peripheral vascular disease ([facility])    Psychiatric History:  No prior psych history.  Family Med/Psych History: History reviewed. No pertinent family history.  Risk of Suicide/Violence: virtually non-existent   Impression/DX:  [name] Mathews is an 85 year old female with history of diabetes, memory deficits, CKD, hyperlipidemia, hypertension, and chronic back pain.  Past history of L3-4 and 4-5  Decompression and fusion years ago.  Presented 06/05/2019 with recurrent back pain radiating to the lower extremities.  Lumbar MRI and imaging demonstrated stenosis spondylolisthesis with radiculopathy at L2-3.  Patient underwent bilateral laminotomy to decompress nerve roots as well as posterior lumbar interbody fusion.  Patient admitted for CIR after initially recovery from surgery.  Patient has had periods of confusion and today patient was disoriented to year but not month or place.    06/26/2019:  Patient was essentially unchanged from first assessment related to mental status.  She was again correct for Place, the fact that she had back surgery and month but did not know year.  She could not tell me what she had for lunch even though it was still in front of her.  Visual spatial abilities and  expressive language good except for severe slowed response times.  The pattern of cognitive deficits are consistent with chronic SVD and cortical atrophy.  Not likely new.        Electronically Signed   _______________________ Arley PhenixJohn , Psy.D.

## 2019-06-26 NOTE — Progress Notes (Addendum)
Social Work Patient ID: [name] Mathews, female   DOB: 1934-04-28, 85 y.o.   MRN: [id number]   CSW met with pt and her dtr to update them on team conference and potential move of d/c date to 06-29-19.  Pt and dtr both pleased with this and feel pt will do better at home.  Dtr has arranged for pt to be on main floor so she doesn't have to do stairs right now.  Bathroom has also been modified.  CSW will arrange [facility] and order DME.  Dtr to bring RW they have a home to make sure it will work for pt.  Dtr requested assistance with getting pt's DOB changed with [employer] supplement and CSW found how to do that and submitted pt's birth certificate via email.  Pt's dtr has been present for family education and feels prepared to take pt home.  CSW remains available to assist as needed.

## 2019-06-26 NOTE — Progress Notes (Signed)
PHYSICAL MEDICINE & REHABILITATION PROGRESS NOTE   Subjective/Complaints:  Pt reports yesterday didn't feel right but feels much better today- per staff, more lethargic yesterday. Still denies pain most of time.  Per staff, daughter doesn't want pt to get Linzess- however still no BM since Sunday- Still not drinking well- still requiring IVFs and BP on low side, even holding BP meds.     Objective:  Physical Exam: Vital Signs Blood pressure 115/62, pulse 69, temperature 98 F (36.7 C), resp. rate 16, height 5\' 9.5" (1.765 m), weight 93 kg, SpO2 99 %.  Vitals and labs reviewed Elderly, female lying back in bed, appears comfortable, but falls asleep easily still, NAD  HEENT exam atraumatic, normocephalic, hair in braids, Wearing glasses  Neck is supple. No jugular venous distention no thyromegaly.  Chest clear to auscultation B/L  Cardiac RRR  Abdominal exam active slightly hypoactive bowel sounds, soft, nontender. Slightly protuberant/distended, but active BS; no tinkling  Extremities no edema.  Neurologic exam she is alert, Ox2- more initiation usually, but falling asleep easily.   Assessment/Plan: 1. Functional deficits secondary to L1-L3 laninotomy/PLIF  Medical Problem List and Plan: 1.Decreased functional mobilitysecondary to L2-3 spondylolisthesis, L1-2 and 2-3 spinal stenosis compressing L2 and III nerve roots with lumbar radiculopathy. Status post L2-3 and L1-2 laminotomy foraminotomies posterior lumbar interbody fusion 06/05/2019. Continue inpatient rehab. 2. Antithrombotics: -DVT/anticoagulation:SCDs.   Vascular study negative for DVT on 8/11 -antiplatelet therapy: N/A 3. Pain Management:Well-controlled. 4. Mood:Rizadyne16 mg daily  5. Neuropsych: This patientiscapable of making decisions on her own behalf. 6. Skin/Wound Care:Routine skin checks  8/17- lumbar incision healing well 7.  Fluids/Electrolytes/Nutrition:Routine in and outs  Basic Metabolic Panel:    Component Value Date/Time   NA 135 06/26/2019 0525   NA 142 05/22/2017 1437   K 4.2 06/26/2019 0525   CL 102 06/26/2019 0525   CO2 23 06/26/2019 0525   BUN 51 (H) 06/26/2019 0525   BUN 18 05/22/2017 1437   CREATININE 1.67 (H) 06/26/2019 0525   GLUCOSE 140 (H) 06/26/2019 0525   CALCIUM 8.8 (L) 06/26/2019 0525   8. ID.  Completed cephalexin for serosanguineous drainage from the back wound. 9.Acute on chronic anemia. Continue iron supplement CBC:    Component Value Date/Time   WBC 5.6 06/25/2019 0629   HGB 8.2 (L) 06/25/2019 0629   HCT 25.0 (L) 06/25/2019 0629   PLT 262 06/25/2019 0629   MCV 89.0 06/25/2019 0629   NEUTROABS 3.0 06/25/2019 0629   LYMPHSABS 1.9 06/25/2019 0629   MONOABS 0.4 06/25/2019 0629   EOSABS 0.3 06/25/2019 0629   BASOSABS 0.0 06/25/2019 0629     10 .Diabetes mellitus. Hemoglobin A1c 6.5.  Adequate control. CBG (last 3)  Recent Labs    06/25/19 2040 06/26/19 0623 06/26/19 1153  GLUCAP 135* 119* 205*   Variable control.  Continue current therapy.  8/24- BGs better since restarted metformin- no diarrhea- had ONE loose stool, which is OK, considering no BM in 6 days.  8/25- will d/c metformin since Cr has bumped to 1.97 and BUN 57 11.Hypertension. Avapro 150 mg daily, HCTZ 12.5 mg daily, Norvasc 10 mg daily. Monitor with increased mobility  8/25- BP on low side- probably not drinking enough, and then getting HCTZ.   8/26- will con't to hold Avapro and Norvasc for now- BP on low side still Vitals:   06/25/19 2016 06/26/19 0449  BP: (!) 109/48 115/62  Pulse: 74 69  Resp: 19 16  Temp: 98 F (36.7 C)  SpO2: 97% 99%  We will continue to monitor. 12.CKD stage II.  Lab Results  Component Value Date   CREATININE 1.67 (H) 06/26/2019    8/25- will give IVFs 80cc/hr x 2 4 hrs and recheck- will hold Metformin and hold Norvasc/Avapro tomorrow as well.  8/26-  con't IVFs for another day- BUN down to 51 and Cr down to 1.67 13. Hyperlipidemia. Crestor 14. Constipation  8/24- pt's LBM Sun early AM- daughter wants meds held, which is not appropriate at this time- will have team d/w daughter.  8/26- no BM since Sunday- asked nursing to use prns and give Sorbitol 60cc that I've ordered 15. Hypocalcemia  8/24- Ca 7.0- will add Ca Carbonate 1x/day 15. Dispo- home with daughter  8/19- team conference today- d/c 9/1  8/26- team conference today- d/c date was moved up per daughter's wishes to 8/29       LOS: 16 days A FACE TO FACE EVALUATION WAS PERFORMED  Tahani Potier 06/26/2019, 1:12 PM

## 2019-06-26 NOTE — Progress Notes (Signed)
Occupational Therapy Session Note  Patient Details  Name: [name] Mathews MRN: [id number] Date of Birth: [birth year]  Today's Date: 06/26/2019 OT Individual Time: 9024-0973 OT Individual Time Calculation (min): 60 min    Short Term Goals: Week 3:  OT Short Term Goal 1 (Week 3): STG = LTGs due to remaining LOS  Skilled Therapeutic Interventions/Progress Updates:    Treatment session with focus on ADL retraining with shower transfers and bathing at shower level.  [name] present for entire session.  Engaged in education regarding bed mobility and sit > stand.  Pt required min assist for sidelying to sitting this session.  Ambulated to toilet with RW with daughter providing CGA and min cues for safe positioning within RW.  Pt completed toileting on toilet prior to shower.  Min cues for sequencing transfer in to walk-in shower.  Mod cues for sequencing during bathing as pt would frequently return to washing face and not continue on with body.  Utilized long handled sponge for LB bathing and therapist provided assistance when washing buttocks.  Completed dressing from toilet seat with cues for sequencing and setup to utilize reacher with LB dressing.  Daughter states she often assisted pt with LB dressing.  Will continue to require repetition with use of reacher to maintain back precautions vs family assisting with threading BLE.  Pt ambulated back to room and left upright in recliner.  Educated daughter on increased wait time between giving instructions to allow for processing time.  Pt easily frustrated by daughter's constant cues.  Pt keeping eyes closed frequently during session, however would open when cued and when provided tasks to engage in visually.  Therapy Documentation Precautions:  Precautions Precautions: Fall, Back Required Braces or Orthoses: Spinal Brace Spinal Brace: Lumbar corset, Applied in sitting position Restrictions Weight Bearing Restrictions: No Pain:  Pt with no  c/o pain   Therapy/Group: Individual Therapy  Simonne Come 06/26/2019, 10:26 AM

## 2019-06-26 NOTE — Progress Notes (Signed)
Physical Therapy Session Note  Patient Details  Name: [name] Mathews MRN: [id number] Date of Birth: July 05, 1934  Today's Date: 06/26/2019 PT Individual Time: 1015-1100 PT Individual Time Calculation (min): 45 min   Short Term Goals: Week 1:  PT Short Term Goal 1 (Week 1): Pt will consistently perform sit<>stand transfers w/ max assist PT Short Term Goal 1 - Progress (Week 1): Met PT Short Term Goal 2 (Week 1): Pt will perform gait x25' in controlled environment with mod assist PT Short Term Goal 2 - Progress (Week 1): Met Week 2:  PT Short Term Goal 1 (Week 2): Pt will perform sit>stands w/ mod assist consistently PT Short Term Goal 2 (Week 2): Pt will perform bed mobility w/ mod assist PT Short Term Goal 3 (Week 2): Pt will initiate functional tasks w/ mod verbal/tactile cues 50% of the time PT Short Term Goal 4 (Week 2): Pt will initiate stair training Week 3:     Skilled Therapeutic Interventions/Progress Updates:      Therapy Documentation Precautions:  Precautions Precautions: Fall, Back Required Braces or Orthoses: Spinal Brace Spinal Brace: Lumbar corset, Applied in sitting position Restrictions Weight Bearing Restrictions: No AM Session: Pain:  Pt denies pain this am Therapeutic Activity:  Daughter present and eager to participate in family training session.  Daughter requested emphasis on stair training due to possible plan to have pt stay in her downstairs living area at dc.    Gait 150FT W/rw AND CGA, VERBAL cues for posture, proximity to walker.  Repeated at end of session w/daughter observing x 120f.    Commode transfer performed w/verbal cues and min assist for positioning walker/hand placement.  Pt able to perform hygiene for urination, but not able to reach bottom to wipe and required assist from therapist.  Daughter present and voiced understanding that pt will need assist w/this at dc due to inability to reach/spinal precautions.  Daughter also assisted pt total  assist for raising brief and pants. Gait 111fw/RW including uneven surface at doorway and transferred to wc all w/cga and verbal cues for safety/sequencing/walker positioning.   Stair training.  Attempted ascending stairs w/single rail on L w/handrail only as home setup.  Pt was unable to follow instructions for this task and daughter stated that she was having second handrail installed before planned dc.  Therefore, pt ascended/descended 4 stairs w/2handrails as therapist demonstrated and discussed proper technique for guarding pt to daughter.  Daughter then proceeded to guard her mother/pt w/repeated stairs as above.  Daughter demonstrated excellent guarding technique and body mechanics w/minimal cueing required from therapist.  Following this training daughter stated that she felt very comfortable w/assisting pt on stairs in her home.    At end of session pt left up in recliner w/needs in reach, alarm belt set, daughter present. Assessment:  Daughter plans to install second handrail on stairs prior to dc home to allow pt to safely move from ground to lower level where her living arrangements are.  Daughter is very involved in mothers care and demonstrates safe guarding technique on stairs as well as a good understanding of her mothers limitations.     Therapy/Group: Individual Therapy   BaCallie FieldingPT  06/26/2019, 12:53 PM

## 2019-06-26 NOTE — Progress Notes (Signed)
Physical Therapy Session Note  Patient Details  Name: [name] Mathews MRN: [id number] Date of Birth: Oct 15, 1934  Today's Date: 06/26/2019 PT Individual Time: 1450-1518 PT Individual Time Calculation (min): 28 min   Short Term Goals:  Week 2:  PT Short Term Goal 1 (Week 2): Pt will perform sit>stands w/ mod assist consistently PT Short Term Goal 2 (Week 2): Pt will perform bed mobility w/ mod assist PT Short Term Goal 3 (Week 2): Pt will initiate functional tasks w/ mod verbal/tactile cues 50% of the time PT Short Term Goal 4 (Week 2): Pt will initiate stair training  Skilled Therapeutic Interventions/Progress Updates:   Pt seated in recliner, wearing LSO,  She denied pain.  Sit> stand with tactile cues for safe hand placement, to RW.  Once standing, pt reported that she needed to use toilet.  Gait training with RW, CGA in room, to toilet.  Toilet transfer with close supervision, cues for sequencing.  Pt manage pants and pull up with extra time and min assist. Peri care with extra time and set up.  Pt continent of urine.  Hand washing at sink in standing with 1 cue.  Gait training backwards walking with RW x 10' with mod cues for bringing RW along, CGA.    Standing with bil UR support for 10 x 1 : mini squats, heel raises, toe raises.  At end of session, pt resting in recliner with bil LEs elevated, needs at hand and set belt alarm set.       Therapy Documentation Precautions:  Precautions Precautions: Fall, Back Required Braces or Orthoses: Spinal Brace Spinal Brace: Lumbar corset, Applied in sitting position Restrictions Weight Bearing Restrictions: No        Therapy/Group: Individual Therapy  Congetta Odriscoll 06/26/2019, 3:23 PM

## 2019-06-27 ENCOUNTER — Inpatient Hospital Stay (HOSPITAL_COMMUNITY): Payer: Medicare Other

## 2019-06-27 ENCOUNTER — Inpatient Hospital Stay (HOSPITAL_COMMUNITY): Payer: Medicare Other | Admitting: Occupational Therapy

## 2019-06-27 LAB — BASIC METABOLIC PANEL
Anion gap: 9 (ref 5–15)
BUN: 43 mg/dL — ABNORMAL HIGH (ref 8–23)
CO2: 22 mmol/L (ref 22–32)
Calcium: 8.8 mg/dL — ABNORMAL LOW (ref 8.9–10.3)
Chloride: 104 mmol/L (ref 98–111)
Creatinine, Ser: 1.5 mg/dL — ABNORMAL HIGH (ref 0.44–1.00)
GFR calc Af Amer: 37 mL/min — ABNORMAL LOW (ref >60)
GFR calc non Af Amer: 32 mL/min — ABNORMAL LOW (ref >60)
Glucose, Bld: 130 mg/dL — ABNORMAL HIGH (ref 70–99)
Potassium: 4.4 mmol/L (ref 3.5–5.1)
Sodium: 135 mmol/L (ref 135–145)

## 2019-06-27 LAB — GLUCOSE, CAPILLARY
Glucose-Capillary: 124 mg/dL — ABNORMAL HIGH (ref 70–99)
Glucose-Capillary: 175 mg/dL — ABNORMAL HIGH (ref 70–99)
Glucose-Capillary: 140 mg/dL — ABNORMAL HIGH (ref 70–99)
Glucose-Capillary: 143 mg/dL — ABNORMAL HIGH (ref 70–99)

## 2019-06-27 MED ORDER — MAGNESIUM CITRATE PO SOLN
1.0000 | Freq: Once | ORAL | Status: DC
  Filled 2019-06-27 (×2): qty 296

## 2019-06-27 NOTE — Discharge Summary (Signed)
Physician Discharge Summary  Patient ID: [name] Mathews MRN: [id number] DOB/AGE: 634-Mar-1935 85 y.o.  Admit date: 06/10/2019 Discharge date: 06/29/2019  Discharge Diagnoses:  Active Problems:   Lumbar radiculopathy   Drug induced constipation   CKD (chronic kidney disease), stage II   Labile blood pressure   Diabetes mellitus type 2 in obese (HCC)   Labile blood glucose   Acute on chronic anemia   Postoperative confusion   Small vessel disease, cerebrovascular DVT prophylaxis Hyperlipidemia  Discharged Condition: Stable  Significant Diagnostic Studies: Dg Lumbar Spine 2-3 Views  Result Date: 06/05/2019 CLINICAL DATA:  Lumbar spine surgery. EXAM: LUMBAR SPINE - 2-3 VIEW; DG C-ARM 61-120 MIN COMPARISON:  Lumbar CT myelogram dated 04/27/2012 and lumbar spine MR dated 03/29/2012. Neither of those images have the levels labeled. FINDINGS: Review of the previous lumbar spine CT demonstrates 5 non-rib-bearing lumbar vertebrae. Therefore, the last open disc space is the L5-S1 level. There is interbody and pedicle screw placement at 3 levels with no rods seen. Grossly normal alignment at those levels, with poor visualization of the vertebral margins. The images do not include enough of the spine to determine the levels. IMPRESSION: Operative changes, as described above. Electronically Signed   By: Beckie SaltsSteven  Reid M.D.   On: 06/05/2019 13:52   Dg C-arm 1-60 Min  Result Date: 06/05/2019 CLINICAL DATA:  Lumbar spine surgery. EXAM: LUMBAR SPINE - 2-3 VIEW; DG C-ARM 61-120 MIN COMPARISON:  Lumbar CT myelogram dated 04/27/2012 and lumbar spine MR dated 03/29/2012. Neither of those images have the levels labeled. FINDINGS: Review of the previous lumbar spine CT demonstrates 5 non-rib-bearing lumbar vertebrae. Therefore, the last open disc space is the L5-S1 level. There is interbody and pedicle screw placement at 3 levels with no rods seen. Grossly normal alignment at those levels, with poor visualization of  the vertebral margins. The images do not include enough of the spine to determine the levels. IMPRESSION: Operative changes, as described above. Electronically Signed   By: Beckie SaltsSteven  Reid M.D.   On: 06/05/2019 13:52   Vas [place] Lower Extremity Venous (dvt)  Result Date: 06/11/2019  Lower Venous Study Indications: Edema.  Comparison Study: no prior Performing Technologist: Jeb LeveringJill Parker RDMS, RVT  Examination Guidelines: A complete evaluation includes B-mode imaging, spectral Doppler, color Doppler, and power Doppler as needed of all accessible portions of each vessel. Bilateral testing is considered an integral part of a complete examination. Limited examinations for reoccurring indications may be performed as noted.  +---------+---------------+---------+-----------+----------+-------+ RIGHT    CompressibilityPhasicitySpontaneityPropertiesSummary +---------+---------------+---------+-----------+----------+-------+ CFV      Full           Yes      Yes                          +---------+---------------+---------+-----------+----------+-------+ SFJ      Full                                                 +---------+---------------+---------+-----------+----------+-------+ FV Prox  Full                                                 +---------+---------------+---------+-----------+----------+-------+ FV Mid   Full                                                 +---------+---------------+---------+-----------+----------+-------+  FV DistalFull                                                 +---------+---------------+---------+-----------+----------+-------+ PFV      Full                                                 +---------+---------------+---------+-----------+----------+-------+ POP      Full           Yes      Yes                          +---------+---------------+---------+-----------+----------+-------+ PTV      Full                                                  +---------+---------------+---------+-----------+----------+-------+ PERO     Full                                                 +---------+---------------+---------+-----------+----------+-------+   +---------+---------------+---------+-----------+----------+-------+ LEFT     CompressibilityPhasicitySpontaneityPropertiesSummary +---------+---------------+---------+-----------+----------+-------+ CFV      Full           Yes      Yes                          +---------+---------------+---------+-----------+----------+-------+ SFJ      Full                                                 +---------+---------------+---------+-----------+----------+-------+ FV Prox  Full                                                 +---------+---------------+---------+-----------+----------+-------+ FV Mid   Full                                                 +---------+---------------+---------+-----------+----------+-------+ FV DistalFull                                                 +---------+---------------+---------+-----------+----------+-------+ PFV      Full                                                 +---------+---------------+---------+-----------+----------+-------+ POP  Full           Yes      Yes                          +---------+---------------+---------+-----------+----------+-------+ PTV      Full                                                 +---------+---------------+---------+-----------+----------+-------+ PERO     Full                                                 +---------+---------------+---------+-----------+----------+-------+     Summary: Right: There is no evidence of deep vein thrombosis in the lower extremity. No cystic structure found in the popliteal fossa. Left: There is no evidence of deep vein thrombosis in the lower extremity. No cystic structure found in the popliteal fossa.  *See  table(s) above for measurements and observations. Electronically signed by Harold Barban MD on 06/11/2019 at 8:45:32 PM.    Final     Labs:  Basic Metabolic Panel: Recent Labs  Lab 06/25/19 0629 06/26/19 0525 06/27/19 0623  NA 135 135 135  K 4.2 4.2 4.4  CL 102 102 104  CO2 24 23 22   GLUCOSE 142* 140* 130*  BUN 57* 51* 43*  CREATININE 1.97* 1.67* 1.50*  CALCIUM 8.8* 8.8* 8.8*    CBC: Recent Labs  Lab 06/25/19 0629  WBC 5.6  NEUTROABS 3.0  HGB 8.2*  HCT 25.0*  MCV 89.0  PLT 262    CBG: Recent Labs  Lab 06/26/19 2121 06/27/19 0644 06/27/19 1134 06/27/19 1626 06/27/19 2100  GLUCAP 170* 124* 175* 140* 143*   Family history.  Reported history of hypertension.  Denies any colon cancer  Brief HPI:   [name] Mathews is a 85 y.o. right-handed female with history of diabetes mellitus, memory deficits, CKD stage II with creatinine baseline 1.25-1.57, hyperlipidemia, hypertension, normocytic anemia as well as chronic back pain with history of L3-4 and 4-5 decompression and fusion a few years ago.  Per chart review independent with assistive device prior to admission living with daughter.  Two-level home with ramped entrance.  Patient had been receiving home health therapies.  Presented 06/05/2019 with recurrent back pain radiating to the lower extremities.  Lumbar MRI and imaging demonstrated stenosis spondylolisthesis with radiculopathy at L2-3.  Underwent bilateral 2-3 and L1-2 laminotomy foraminotomies to decompress the bilateral L2 and L3 nerve roots as well as posterior lumbar interbody fusion 06/05/2019 per Dr. Arnoldo Morale.  Back brace out of bed applied in sitting position.  Hospital course pain management intermittent bouts of confusion monitored on narcotics with documented history of some memory deficits.  She did have some serosanguineous drainage from wound site placed on Keflex 06/07/2019.  Acute blood loss anemia 8.4 monitored.  Patient was admitted for a comprehensive rehab  program.   Hospital Course: LYCIA SACHDEVA was admitted to rehab 06/10/2019 for inpatient therapies to consist of PT, ST and OT at least three hours five days a week. Past admission physiatrist, therapy team and rehab RN have worked together to provide customized collaborative inpatient rehab.  Pertaining to patient's lumbar spondylolisthesis spinal stenosis with radiculopathy  status post L2-3 1-2 laminotomy foraminotomies 06/05/2019.  She would follow-up with neurosurgery Dr. Lovell SheehanJenkins.  She did have some initial serosanguineous drainage from back wound and monitor no odor noted completed course of cephalexin.  SCDs for DVT prophylaxis venous Doppler studies negative.  Pain management with the use of Flexeril as needed and all other narcotics avoided due to bouts of confusion as well as history of memory deficits.  Blood pressures monitored on Norvasc. Avapro and HCTZ held due to labile pressure and would follow-up with primary MD.  Bowel program somewhat difficult to establish initially felt possibly related to her Glucophage she was maintained on Linzess as well as Senokot and discussed with daughter on maintaining her bowel program.  History of CKD stage II latest creatinine 1.39 she did receive a short run of IV fluids metformin was held due to this initial bump in creatinine monitor closely encouragement of fluids.  Acute on chronic anemia latest hemoglobin 8.1 she was asymptomatic no signs of bleeding.  Patient did remain on iron supplement.  She continued on Crestor for hyperlipidemia.  Hemoglobin A1c of 6.5 she continued on Actos and as noted had been on Glucophage prior to admission.  Patient did remain on razadyne 60 mg daily as prior to admission for memory deficits as well as follow-up per neuropsychology during her rehab stay.   Blood pressures were monitored on TID basis and continued on present regimen with norvasc 5 mg daily  Diabetes has been monitored with ac/hs CBG checks and SSI was use prn  for tighter BS control.   She is continent of bowel and bladder.  She did have occasional incontinence felt to be related to possible bowel program  She has made gains during rehab stay and is attending therapies  He/She will continue to receive follow up therapies   after discharge  Rehab course: During patient's stay in rehab weekly team conferences were held to monitor patient's progress, set goals and discuss barriers to discharge. At admission, patient required minimal assist ambulate 25 feet rolling walker, max assist sit to side-lying, +2 physical assist sit to stand.  Minimal assist upper body bathing, moderate assist lower body bathing, max assist upper body dressing, moderate assist lower body dressing, +2 physical assist toilet transfers  Physical exam.  Blood pressure 135/61, pulse 80, temperature 99, respirations 16, oxygen saturation 95% room air Constitutional well-developed well-nourished HEENT Head.  Normocephalic and atraumatic Eyes.  Pupils round and reactive to light without discharge EOMs normal Neck.  Supple nontender no thyromegaly without JVD Cardiovascular normal rate and rhythm exam reveals no friction rub or murmur heard Respiratory.  Effort normal no respiratory distress without wheezes GI.  Soft exhibits no distention nontender positive bowel sounds Neurological alert sitting up in chair oriented to basic questions of person and place mood was a bit flat but appropriate follow commands.  Upper extremities 4 out of 5 although grip on the left was 3+ to 4- lower extremities 3+ hip flexors 4- knee extension and 4 out of 5 ankle dorsi plantarflexion.  Back incision with dressing in place no odor  She  has had improvement in activity tolerance, balance, postural control as well as ability to compensate for deficits. She has had improvement in functional use RUE/LUE  and RLE/LLE as well as improvement in awareness.  Patient ambulates 150 feet contact-guard assist with  assistive device she was able to repeat this with sessions with daughter undergoing education.  Commode transfers performed with verbal cues minimal assist  for positioning walker.  Patient able to perform hygiene for urination daughter present and voiced understanding on the need for supervision.  Attempted ascending stairs with single rail on left with hand rail only as home set up.  She was planning on having a second hand rail installed at the house.  Daughter demonstrated excellent guarding technique and body mechanics with minimal cueing required during sessions.  Full family teaching completed plan discharge to home       Disposition: Discharge disposition: 01-Home or Self Care     Discharge to home   Diet: Diabetic diet  Special Instructions: No driving smoking or alcohol Back brace when out of bed  Follow-up with PCP in regards to blood pressure control with Avapro and HCTZ currently on hold due to labile blood pressure  Medications at discharge. 1.  Tylenol as needed 2.  Norvasc 5 mg p.o. daily 3.  Os-Cal 500 mg daily 4.  Flexeril 10 mg p.o. 3 times daily as needed 5.  Ferrous sulfate 325 mg p.o. daily 6.razadyne 16 mg every evening 7.  Linzess 145 mcg p.o. daily-family had been refusing 8.  Actos 30 mg p.o. daily 9.  Crestor 5 mg p.o. daily 10.  Senokot S1 tablet p.o. twice daily 11.  Aspirin 81 mg daily 12.  Slow release iron daily.  Discharge Instructions    Ambulatory referral to Physical Medicine Rehab   Complete by: As directed    Moderate complexity follow up 1-2 weeks lumbar radiculopathy      Follow-up Information    Lovorn, Aundra Millet, MD Follow up.   Specialty: Physical Medicine and Rehabilitation Why: Office to call for appointment Contact information: [address] [place] [address] [phone]        Tressie Stalker, MD Follow up.   Specialty: Neurosurgery Why: Call for appointment Contact information: [address] [place] [address] [phone]           Signed: Charlton Amor 06/28/2019, 5:28 AM

## 2019-06-27 NOTE — Progress Notes (Signed)
  Patient ID: [name] Mathews, female   DOB: Mar 30, 1934, 85 y.o.   MRN: [id number]      Diagnosis codes:  M54.16; Z47.89  Height:          5'9"       Weight:         213 lbs      Patient suffers from lumbar radiculopathy which impairs her ability to perform daily activities like bathing, dressing, grooming, feeding, toileting in the home.  A cane or walker will not resolve issue with performing activities of daily living.  A wheelchair will allow patient to safely perform daily activities.  Patient is not able to propel herself in the home using a standard weight wheelchair due to general weakness.  Patient can self propel in the lightweight wheelchair.

## 2019-06-27 NOTE — Progress Notes (Signed)
Physical Therapy Session Note  Patient Details  Name: [name] Mathews MRN: [id number] Date of Birth: Nov 22, 1933  Today's Date: 06/27/2019 PT Individual Time: 1320-1415 PT Individual Time Calculation (min): 55 min   Short Term Goals: Week 2:  PT Short Term Goal 1 (Week 2): Pt will perform sit>stands w/ mod assist consistently PT Short Term Goal 2 (Week 2): Pt will perform bed mobility w/ mod assist PT Short Term Goal 3 (Week 2): Pt will initiate functional tasks w/ mod verbal/tactile cues 50% of the time PT Short Term Goal 4 (Week 2): Pt will initiate stair training  Skilled Therapeutic Interventions/Progress Updates:    [name] present during session and engaged in ongoing family education with hands on assistance performed (during short distance gait and toileting tasks) as well as discussion in regards to goals, progress, and recommendations for home. Discussed importance of HEP and continued mobility as well as a structured routine (independent of who the caregiver is that day as this may differ) to allow for increased success with mobility, cognition, and motivation. Pt very reluctant to participate in skilled PT session today and requires max encouragement, often keeping eyes closed throughout. Pt requires significant amount of extra time, cues, and encouragement. Today requires some tactile cues to decreased arousal ranging from supervision to CGA throughout session for various functional mobility tasks (bed mobility, transfers, and gait with RW). Pt able to gait x 140' x 2 to and from therapy gym with mod verbal cues needed for safe positioning of RW and attention to obstacles due to decreased attention today. Pt able to gait in and out of bathroom with RW and had continent BM (very large) and was already incontinent of urine. Daughter, [name] Mathews, assisted with clothing management and hygiene as well as providing cues for transfers and gait within the room. End of session returned to recliner  with NT to assess vitals. Pt required max cues for sequencing and problem solving transfer to recliner safely due to decreased arousal.   Therapy Documentation Precautions:  Precautions Precautions: Fall, Back Required Braces or Orthoses: Spinal Brace Spinal Brace: Lumbar corset, Applied in sitting position Restrictions Weight Bearing Restrictions: No Pain:  Denies pain except discomfort with BM. RN aware.   Therapy/Group: Individual Therapy  [name], PT, DPT, CBIS  06/27/2019, 2:19 PM

## 2019-06-27 NOTE — Progress Notes (Signed)
[facility] PHYSICAL MEDICINE & REHABILITATION PROGRESS NOTE   Subjective/Complaints:  Pt reports had a very small BM last night- per staff, almost a smear. Sat on toilet 30 minutes this AM during OT, but no results.  Will have pt get cleaned out today- push fluids by nursing  Objective:  Physical Exam: Vital Signs Blood pressure 132/61, pulse 77, temperature 99.4 F (37.4 C), temperature source Oral, resp. rate 17, height 5' 9.5" (1.765 m), weight 93 kg, SpO2 97 %.  Vitals and labs reviewed Elderly, female sitting up in manual w/c, OT and nursing in room, NAD  HEENT exam atraumatic, normocephalic, hair in braids still, Wearing glasses  Neck is supple. No jugular venous distention no thyromegaly.  Chest clear to auscultation B/L  Cardiac RRR  Abdominal exam active slightly hypoactive bowel sounds, soft, nontender. Slightly protuberant/distended, but active BS; no tinkling  Extremities no edema.  Neurologic exam she is alert, Ox2-    Assessment/Plan: 1. Functional deficits secondary to L1-L3 laninotomy/PLIF  Medical Problem List and Plan: 1.Decreased functional mobilitysecondary to L2-3 spondylolisthesis, L1-2 and 2-3 spinal stenosis compressing L2 and III nerve roots with lumbar radiculopathy. Status post L2-3 and L1-2 laminotomy foraminotomies posterior lumbar interbody fusion 06/05/2019. Continue inpatient rehab. 2. Antithrombotics: -DVT/anticoagulation:SCDs.   Vascular study negative for DVT on 8/11 -antiplatelet therapy: N/A 3. Pain Management:Well-controlled. 4. Mood:Rizadyne16 mg daily  5. Neuropsych: This patientiscapable of making decisions on her own behalf. 6. Skin/Wound Care:Routine skin checks  8/17- lumbar incision healing well 7. Fluids/Electrolytes/Nutrition:Routine in and outs  Basic Metabolic Panel:    Component Value Date/Time   NA 135 06/27/2019 0623   NA 142 05/22/2017 1437   K 4.4 06/27/2019 0623   CL 104  06/27/2019 0623   CO2 22 06/27/2019 0623   BUN 43 (H) 06/27/2019 0623   BUN 18 05/22/2017 1437   CREATININE 1.50 (H) 06/27/2019 0623   GLUCOSE 130 (H) 06/27/2019 0623   CALCIUM 8.8 (L) 06/27/2019 0623   8. ID.  Completed cephalexin for serosanguineous drainage from the back wound. 9.Acute on chronic anemia. Continue iron supplement CBC:    Component Value Date/Time   WBC 5.6 06/25/2019 0629   HGB 8.2 (L) 06/25/2019 0629   HCT 25.0 (L) 06/25/2019 0629   PLT 262 06/25/2019 0629   MCV 89.0 06/25/2019 0629   NEUTROABS 3.0 06/25/2019 0629   LYMPHSABS 1.9 06/25/2019 0629   MONOABS 0.4 06/25/2019 0629   EOSABS 0.3 06/25/2019 0629   BASOSABS 0.0 06/25/2019 0629     10.Diabetes mellitus. Hemoglobin A1c 6.5.  Adequate control. CBG (last 3)  Recent Labs    06/26/19 1651 06/26/19 2121 06/27/19 0644  GLUCAP 133* 170* 124*   Variable control.  Continue current therapy.  8/24- BGs better since restarted metformin- no diarrhea- had ONE loose stool, which is OK, considering no BM in 6 days.  8/25- will d/c metformin since Cr has bumped to 1.97 and BUN 57 11.Hypertension. Avapro 150 mg daily, HCTZ 12.5 mg daily, Norvasc 10 mg daily. Monitor with increased mobility  8/25- BP on low side- probably not drinking enough, and then getting HCTZ.   8/26- will con't to hold Avapro and Norvasc for now- BP on low side still  8/27- d/c'd Avapro and HCTZ since BP on low side and want to prevent renal side effects. Vitals:   06/26/19 1930 06/27/19 0605  BP: (!) 121/50 132/61  Pulse: 70 77  Resp: 16 17  Temp: 98.6 F (37 C) 99.4 F (37.4 C)  SpO2: 100% 97%  We will continue to monitor. 12.CKD stage II.  Lab Results  Component Value Date   CREATININE 1.50 (H) 06/27/2019    8/25- will give IVFs 80cc/hr x 2 4 hrs and recheck- will hold Metformin and hold Norvasc/Avapro tomorrow as well.  8/26- con't IVFs for another day- BUN down to 51 and Cr down to 1.67  8/27- will stop IVFs  since Bun down to 43 and Cr down to her baseline of 1.5- however still push fluids and d/c BP meds- to see how see will do at home off IVFs. 13. Hyperlipidemia. Crestor 14. Constipation  8/24- pt's LBM Sun early AM- daughter wants meds held, which is not appropriate at this time- will have team d/w daughter.  8/26- no BM since Sunday- asked nursing to use prns and give Sorbitol 60cc that I've ordered  8/27- Ordered Mg citrate and suppository today since not stooling. 15. Hypocalcemia  8/24- Ca 7.0- will add Ca Carbonate 1x/day  8/27- Ca up to 8.8 15. Dispo- home with daughter  8/19- team conference today- d/c 9/1  8/26- team conference today- d/c date was moved up per daughter's wishes to 8/29       LOS: 17 days A FACE TO FACE EVALUATION WAS PERFORMED  Kalvyn Desa 06/27/2019, 9:27 AM

## 2019-06-27 NOTE — Progress Notes (Signed)
Occupational Therapy Session Note  Patient Details  Name: [name] Mathews MRN: [id number] Date of Birth: [birth year]  Today's Date: 06/27/2019 OT Individual Time: 3536-1443 OT Individual Time Calculation (min): 45 min    Short Term Goals: Week 3:  OT Short Term Goal 1 (Week 3): STG = LTGs due to remaining LOS  Skilled Therapeutic Interventions/Progress Updates:    Treatment session with focus on functional mobility and self-care retraining.  Pt received supine in bed agreeable to therapy session.  Pt declined bathing this morning but agreeable to dressing.  Completed bed mobility with supervision to roll in to sidelying and then min assist when coming up to sitting at EOB.  Increased time and min assist for sit > stand at EOB.  Pt ambulated to toilet with CGA with RW.  Completed toileting task with CGA and cues to manage clothing post toileting.  Donned pullup brief and pants while seated on toilet with setup assist and mod multimodal cues to utilize reacher when threading pants.  Ambulated to recliner with RW with CGA.  Engaged in [place] with setup assist and min cues, therapist donned LSO.  Pt left upright in recliner with all needs in reach and RN present to administer medications.    Therapy Documentation Precautions:  Precautions Precautions: Fall, Back Required Braces or Orthoses: Spinal Brace Spinal Brace: Lumbar corset, Applied in sitting position Restrictions Weight Bearing Restrictions: No General:   Vital Signs: Therapy Vitals Temp: 99.4 F (37.4 C) Temp Source: Oral Pulse Rate: 77 Resp: 17 BP: 132/61 Patient Position (if appropriate): Lying Oxygen Therapy SpO2: 97 % O2 Device: Room Air Pain: Pt with no c/o pain  Therapy/Group: Individual Therapy  Simonne Come 06/27/2019, 9:07 AM

## 2019-06-27 NOTE — Progress Notes (Signed)
Spoke with daughter [name] to update.

## 2019-06-27 NOTE — Progress Notes (Signed)
Occupational Therapy Session Note  Patient Details  Name: [name] Mathews MRN: [id number] Date of Birth: May 24, 1934  Today's Date: 06/27/2019 OT Individual Time: 0930-1010 OT Individual Time Calculation (min): 40 min    Short Term Goals: Week 2:  OT Short Term Goal 1 (Week 2): Pt will complete LB dressing with max assist of one caregiver OT Short Term Goal 1 - Progress (Week 2): Met OT Short Term Goal 2 (Week 2): Pt will don LSO with mod assist OT Short Term Goal 2 - Progress (Week 2): Not progressing OT Short Term Goal 3 (Week 2): Pt will complete sit > stand for LB dressing with mod assist consistently OT Short Term Goal 3 - Progress (Week 2): Met OT Short Term Goal 4 (Week 2): Pt will complete toilet transfer with mod assist OT Short Term Goal 4 - Progress (Week 2): Met  Skilled Therapeutic Interventions/Progress Updates:    Pt resting in recliner upon arrival.  OT intervention with focus on sit<>stand, standing balance, functional amb with RW, bed mobility, and activity tolerance to increase independence with BADLs.  Pt scooted forward in recliner in preparation for sit<>stand, performed sit<>stand, and amb with RW to sink to complete grooming tasks.  Pt required sitting and standing rest breaks between each segment.  Once a sink pt rested with forearms on sink before completing task.  Pt did not request use of toilet but amb with RW into bathroom and back to EOB.  Pt stated she needed to rest.  Pt did not want to doff LSO while resting in bed.  Pt performed sit>supine with supervision.  Pt requires much more than reasonable amount of time for ambulation and all transitional movements. Pt remained in bed with all needs within reach and bed alarm activated.   Therapy Documentation Precautions:  Precautions Precautions: Fall, Back Required Braces or Orthoses: Spinal Brace Spinal Brace: Lumbar corset, Applied in sitting position Restrictions Weight Bearing Restrictions: No :   Pain:  Pt denies pain this morning   Therapy/Group: Individual Therapy  Leroy Libman 06/27/2019, 10:12 AM

## 2019-06-27 NOTE — Plan of Care (Signed)
  Problem: Consults Goal: RH SPINAL CORD INJURY PATIENT EDUCATION Description:  See Patient Education module for education specifics.  Outcome: Progressing   Problem: SCI BOWEL ELIMINATION Goal: RH STG MANAGE BOWEL WITH ASSISTANCE Description: STG Manage Bowel with min Assistance. Outcome: Progressing Goal: RH STG SCI MANAGE BOWEL WITH MEDICATION WITH ASSISTANCE Description: STG SCI Manage bowel with medication with  mod I assistance. Outcome: Progressing Goal: RH STG SCI MANAGE BOWEL PROGRAM W/ASSIST OR AS APPROPRIATE Description: STG SCI Manage bowel program w/min assist or as appropriate. Outcome: Progressing   Problem: SCI BLADDER ELIMINATION Goal: RH STG MANAGE BLADDER WITH ASSISTANCE Description: STG Manage Bladder With min Assistance Outcome: Progressing   Problem: RH SKIN INTEGRITY Goal: RH STG MAINTAIN SKIN INTEGRITY WITH ASSISTANCE Description: STG Maintain Skin Integrity With min Assistance. Outcome: Progressing Goal: RH STG ABLE TO PERFORM INCISION/WOUND CARE W/ASSISTANCE Description: STG Able To Perform Incision/Wound Care With min Assistance. Outcome: Progressing   Problem: RH SAFETY Goal: RH STG ADHERE TO SAFETY PRECAUTIONS W/ASSISTANCE/DEVICE Description: STG Adhere to Safety Precautions With cues/reminders Outcome: Progressing   Problem: RH PAIN MANAGEMENT Goal: RH STG PAIN MANAGED AT OR BELOW PT'S PAIN GOAL Description: At or below level 4 Outcome: Progressing   Problem: RH KNOWLEDGE DEFICIT SCI Goal: RH STG INCREASE KNOWLEDGE OF SELF CARE AFTER SCI Description: Pt will be able to direct care using handouts/educational materials with cues/reminders and caregiver will be able to provide care using educational information independently Outcome: Progressing   

## 2019-06-28 ENCOUNTER — Inpatient Hospital Stay (HOSPITAL_COMMUNITY): Payer: Medicare Other | Admitting: Occupational Therapy

## 2019-06-28 ENCOUNTER — Inpatient Hospital Stay (HOSPITAL_COMMUNITY): Payer: Medicare Other | Admitting: Physical Therapy

## 2019-06-28 ENCOUNTER — Inpatient Hospital Stay (HOSPITAL_COMMUNITY): Payer: Medicare Other

## 2019-06-28 LAB — BASIC METABOLIC PANEL
Anion gap: 10 (ref 5–15)
BUN: 38 mg/dL — ABNORMAL HIGH (ref 8–23)
CO2: 23 mmol/L (ref 22–32)
Calcium: 8.9 mg/dL (ref 8.9–10.3)
Chloride: 102 mmol/L (ref 98–111)
Creatinine, Ser: 1.39 mg/dL — ABNORMAL HIGH (ref 0.44–1.00)
GFR calc Af Amer: 41 mL/min — ABNORMAL LOW (ref >60)
GFR calc non Af Amer: 35 mL/min — ABNORMAL LOW (ref >60)
Glucose, Bld: 122 mg/dL — ABNORMAL HIGH (ref 70–99)
Potassium: 4.2 mmol/L (ref 3.5–5.1)
Sodium: 135 mmol/L (ref 135–145)

## 2019-06-28 LAB — GLUCOSE, CAPILLARY
Glucose-Capillary: 114 mg/dL — ABNORMAL HIGH (ref 70–99)
Glucose-Capillary: 225 mg/dL — ABNORMAL HIGH (ref 70–99)
Glucose-Capillary: 125 mg/dL — ABNORMAL HIGH (ref 70–99)
Glucose-Capillary: 134 mg/dL — ABNORMAL HIGH (ref 70–99)

## 2019-06-28 MED ORDER — ACETAMINOPHEN 325 MG PO TABS: 650.0000 mg | ORAL_TABLET | ORAL | Status: DC | PRN

## 2019-06-28 MED ORDER — CYCLOBENZAPRINE HCL 10 MG PO TABS: 10.0000 mg | ORAL_TABLET | Freq: Three times a day (TID) | ORAL | 0 refills | Status: DC | PRN

## 2019-06-28 MED ORDER — AMLODIPINE BESYLATE 5 MG PO TABS: 5.0000 mg | ORAL_TABLET | Freq: Every day | ORAL | 0 refills | Status: DC

## 2019-06-28 MED ORDER — ROSUVASTATIN CALCIUM 5 MG PO TABS: 5.0000 mg | ORAL_TABLET | Freq: Every day | ORAL | 0 refills | Status: DC

## 2019-06-28 MED ORDER — VITAMIN D3 125 MCG (5000 UT) PO TABS: 1.0000 | ORAL_TABLET | Freq: Every day | ORAL | 0 refills | Status: DC

## 2019-06-28 MED ORDER — PIOGLITAZONE HCL 30 MG PO TABS: 30.0000 mg | ORAL_TABLET | Freq: Every day | ORAL | 0 refills | Status: DC

## 2019-06-28 NOTE — Discharge Instructions (Signed)
Inpatient Rehab Discharge Instructions  [name] Mathews Discharge date and time: No discharge date for patient encounter.   Activities/Precautions/ Functional Status: Activity: Back brace when out of bed Diet: diabetic diet Wound Care: keep wound clean and dry Functional status:  ___ No restrictions     ___ Walk up steps independently ___ 24/7 supervision/assistance   ___ Walk up steps with assistance ___ Intermittent supervision/assistance  ___ Bathe/dress independently ___ Walk with walker     _x__ Bathe/dress with assistance ___ Walk Independently    ___ Shower independently ___ Walk with assistance    ___ Shower with assistance ___ No alcohol     ___ Return to work/school ________  COMMUNITY REFERRALS UPON DISCHARGE:   Home Health:   PT     OT     SNA   Agency:  [facility] Phone:  [phone] Medical Equipment/Items Ordered:  20"x18" lightweight wheelchair with basic cushion; 3-in-1 commode  Agency/Supplier:  [facility]        Phone:  [phone]  Special Instructions:  No driving smoking or alcohol  My questions have been answered and I understand these instructions. I will adhere to these goals and the provided educational materials after my discharge from the hospital.  Patient/Caregiver Signature _______________________________ Date __________  Clinician Signature _______________________________________ Date __________  Please bring this form and your medication list with you to all your follow-up doctor's appointments.

## 2019-06-28 NOTE — Progress Notes (Signed)
Physical Therapy Session Note  Patient Details  Name: [name] Mathews MRN: [id number] Date of Birth: 1934-09-17  Today's Date: 06/28/2019 PT Individual Time: 0805-0859 PT Individual Time Calculation (min): 54 min   Short Term Goals: Week 2:  PT Short Term Goal 1 (Week 2): Pt will perform sit>stands w/ mod assist consistently PT Short Term Goal 2 (Week 2): Pt will perform bed mobility w/ mod assist PT Short Term Goal 3 (Week 2): Pt will initiate functional tasks w/ mod verbal/tactile cues 50% of the time PT Short Term Goal 4 (Week 2): Pt will initiate stair training  Skilled Therapeutic Interventions/Progress Updates:  Pt received in bed & agreeable to tx, denying c/o pain during session. Pt transfers supine>sitting EOB with bed rails, HOB elevated & supervision. Therapist assists with donning pants sitting/standing EOB for time management & donning LSO. Pt requires max cuing for hand placement, sequencing & technique, and safety for all transfers throughout session. Pt transfers sit<>stand with RW & supervision with extra time, ambulating ~5 ft to w/c with RW & supervision. Transported pt to gym via w/c dependent assist for time management. Pt completes car transfer at sedan simulated  Height with RW & supervision with poor eccentric control & hand placement stand>sit. Pt negotiates ramp & mulch with RW & CGA with max cuing for safety when transitioning between two surfaces. Set pt up to attempt stair negotiation when pt reports need to use restroom. Assisted pt back to room and pt requires extra time and cuing to initiate sit>stand from w/c but ambulates in room/bathroom with RW & supervision with cuing/assist to safely manage RW in small space. Pt with incontinent void in brief & continent void on toilet & requires cuing to get off of toilet once finished. Assisted pt with donning clean pants as first pair soiled with urine. Pt performed hand hygiene standing at sink with supervision. During gait pt  requires cuing to ambulate within base of RW and limit forward flexion with poor demo & carryover. At end of session pt left in recliner with chair alarm donned, call bell in reach.   Therapy Documentation Precautions:  Precautions Precautions: Fall, Back Required Braces or Orthoses: Spinal Brace Spinal Brace: Lumbar corset, Applied in sitting position Restrictions Weight Bearing Restrictions: No    Therapy/Group: Individual Therapy  Waunita Schooner 06/28/2019, 9:03 AM

## 2019-06-28 NOTE — Progress Notes (Signed)
Occupational Therapy Session Note  Patient Details  Name: [name] Mathews MRN: [id number] Date of Birth: 28-Feb-1934  Today's Date: 06/28/2019 OT Individual Time: 0900-1000 OT Individual Time Calculation (min): 60 min    Short Term Goals: Week 3:  OT Short Term Goal 1 (Week 3): STG = LTGs due to remaining LOS  Skilled Therapeutic Interventions/Progress Updates:    Completed ADL retraining at overall Min assist.  Pt received upright in recliner in handoff from PT.  Pt required increased time and ultimately min assist to stand from recliner due to fatigue.  Pt ambulated to sink with RW with CGA.  Engaged in bathing and dressing at sit > stand level at sink.  Pt continues to require hand over hand assist for sequencing with LB dressing with use of reacher, still requiring physical assistance when threading BLE.  Pt completed all sit > stand from w/c with supervision.  Able to pull pants over hips in standing with increased time and supervision.  Grooming tasks completed in sitting for energy conservation, per pt request.  Ambulated back to recliner with RW with close supervision.  Pt left upright in recliner with seat belt alarm on and all needs in reach.  Therapy Documentation Precautions:  Precautions Precautions: Fall, Back Required Braces or Orthoses: Spinal Brace Spinal Brace: Lumbar corset, Applied in sitting position Restrictions Weight Bearing Restrictions: No General:   Vital Signs: Therapy Vitals Pulse Rate: 74 BP: (!) 111/53 Oxygen Therapy SpO2: 99 % Pain:  Pt with no c/o pain   Therapy/Group: Individual Therapy  Simonne Come 06/28/2019, 9:51 AM

## 2019-06-28 NOTE — Progress Notes (Signed)
Physical Therapy Discharge Summary  Patient Details  Name: [name] Mathews MRN: [id number] Date of Birth: [birth year]  Today's Date: 06/28/2019 PT Individual Time: 7989-2119 PT Individual Time Calculation (min): 30 min  Denies pain. Functional transfer from recliner and then short distance gait into and out of bathroom with supervision overall and extra time using RW. Continent of urine. Gait training on unit with RW for functional strengthening, endurance, and mobility training with supervision with verbal cues for posture and safe positioning of RW as tendency to keep too far in front of patient. Pt performed 4 steps with rails for home entry practice with CGA overall. Upon return to room, request to lay down and performed transfer and bed mobility at supervision level with cues for back precautions and technique for bed mobility. Pt preferred to lay in sidelying, positioned for comfort.    Patient has met 8 of 8 long term goals due to improved activity tolerance, improved balance, improved postural control and ability to compensate for deficits.  Patient to discharge at household ambulatory level with RW level Supervision. Pt does at times need CGA to initiate movement or to follow through task due to cognitive impairments and decreased motivation. Family education with patient's daughter, [name] Mathews, has been completed during stay.  Patient's care partner is independent to provide the necessary physical and cognitive assistance at discharge.  Reasons goals not met: n/a - all goals met at this time  Recommendation:  Patient will benefit from ongoing skilled PT services in home health setting to continue to advance safe functional mobility, address ongoing impairments in strength, balance, cognition, functional mobility, activity tolerance, endurance, and minimize fall risk.  Equipment: None. Pt already has RW and w/c.   Reasons for discharge: treatment goals met and discharge from  hospital  Patient/family agrees with progress made and goals achieved: Yes  PT Discharge Precautions/Restrictions Precautions Precautions: Fall;Back Required Braces or Orthoses: Spinal Brace Spinal Brace: Lumbar corset;Applied in sitting position Restrictions Weight Bearing Restrictions: No  Cognition Overall Cognitive Status: History of cognitive impairments - at baseline Attention: Sustained Sustained Attention: Impaired Sustained Attention Impairment: Functional basic Memory: Impaired Memory Impairment: Decreased recall of new information Awareness: Impaired Awareness Impairment: Emergent impairment Problem Solving: Impaired Problem Solving Impairment: Functional basic Executive Function: Sequencing;Initiating;Self Correcting;Self Monitoring Sequencing: Impaired Sequencing Impairment: Functional basic Initiating: Impaired Initiating Impairment: Functional basic Self Monitoring: Impaired Self Correcting: Impaired Safety/Judgment: Impaired Sensation Sensation Light Touch: Appears Intact Coordination Gross Motor Movements are Fluid and Coordinated: No Motor  Motor Motor: Other (comment) Motor - Discharge Observations: generalized weakness  Mobility Bed Mobility Bed Mobility: Rolling Right;Rolling Left;Supine to Sit;Sit to Supine Rolling Right: Supervision/verbal cueing Rolling Left: Supervision/Verbal cueing Supine to Sit: Supervision/Verbal cueing;Contact Guard/Touching assist Sit to Supine: Supervision/Verbal cueing Transfers Transfers: Sit to Stand;Stand to Sit;Stand Pivot Transfers Sit to Stand: Supervision/Verbal cueing Stand to Sit: Supervision/Verbal cueing Stand Pivot Transfers: Supervision/Verbal cueing Stand Pivot Transfer Details: Verbal cues for precautions/safety;Verbal cues for gait pattern;Verbal cues for sequencing;Verbal cues for technique;Verbal cues for safe use of DME/AE Stand Pivot Transfer Details (indicate cue type and reason): if poor  initiation or arousal, at times needs CGA to facilitate movement and transitions Transfer (Assistive device): Rolling walker Locomotion  Gait Ambulation: Yes Gait Assistance: Supervision/Verbal cueing Gait Distance (Feet): 150 Feet Assistive device: Rolling walker Gait Assistance Details: Verbal cues for safe use of DME/AE;Verbal cues for gait pattern;Verbal cues for precautions/safety;Verbal cues for sequencing Gait Assistance Details: Longer distance gait limited due to decreased motivation/fatigue. Pt is functional  at household distances Gait Gait: Yes Gait Pattern: Impaired Gait Pattern: Decreased stride length;Shuffle;Trunk flexed Stairs / Additional Locomotion Stairs: Yes Stairs Assistance: Contact Guard/Touching assist Stair Management Technique: Two rails Number of Stairs: 4 Height of Stairs: 6  Trunk/Postural Assessment  Cervical Assessment Cervical Assessment: Within Functional Limits Thoracic Assessment Thoracic Assessment: (flexed posture; back precautions) Lumbar Assessment Lumbar Assessment: Exceptions to WFL(back precautions) Postural Control Postural Control: Deficits on evaluation Righting Reactions: delayed  Balance Balance Balance Assessed: Yes Static Sitting Balance Static Sitting - Level of Assistance: 5: Stand by assistance Dynamic Sitting Balance Dynamic Sitting - Level of Assistance: 5: Stand by assistance Static Standing Balance Static Standing - Level of Assistance: 5: Stand by assistance Static Standing - Comment/# of Minutes: with UE support Dynamic Standing Balance Dynamic Standing - Level of Assistance: 5: Stand by assistance(Supervision to CGA pending fatigue) Extremity Assessment  RUE Assessment RUE Assessment: Within Functional Limits General Strength Comments: grossly 4/5 LUE Assessment LUE Assessment: Within Functional Limits General Strength Comments: grossly 4/5 RLE Assessment RLE Assessment: Exceptions to [facility] General Strength  Comments: grossly 3+/5; decreased muscular endurance LLE Assessment LLE Assessment: Exceptions to [facility] General Strength Comments: grossly 3+/5; decreased muscular endurance    [name], PT, DPT, CBIS  06/28/2019, 1:41 PM

## 2019-06-28 NOTE — Progress Notes (Signed)
Social Work Discharge Note   The overall goal for the admission was met for:   Discharge location: Yes - home with her dtr and son-in-law  Length of Stay: Yes - 19 days  Discharge activity level: Yes - minimal assistance  Home/community participation: Yes  Services provided included: MD, RD, PT, OT, RN, Pharmacy, Neuropsych and SW  Financial Services: Medicare and Private Insurance: Airline pilot supplement  Follow-up services arranged: Home Health: PT/OT/SNA , DME: 20"x18" lightweight wheelchair with basic cushion; 3-in-1 commode and Patient/Family request agency HH: [facility], DME: [facility]  Comments (or additional information): Dtr was present daily and came for family education prior to pt's d/c.  They have home set up for pt's return.  Patient/Family verbalized understanding of follow-up arrangements: Yes  Individual responsible for coordination of the follow-up plan: pt's [name] Mathews - [phone]  Confirmed correct DME delivered: [name] Mathews 06/28/2019    [name] Mathews, [name] Mathews

## 2019-06-28 NOTE — Progress Notes (Signed)
Occupational Therapy Discharge Summary  Patient Details  Name: [name] Mathews MRN: [id number] Date of Birth: June 22, 1934   Patient has met 79 of 11 long term goals due to improved activity tolerance, improved balance, ability to compensate for deficits, improved attention and improved awareness.  Patient to discharge at [facility] Assist level.  Patient's care partner is independent to provide the necessary physical and cognitive assistance at discharge.  Patient's daughter has been present for hands on family education and is able to provide the necessary physical assist and cues.  Reasons goals not met: N/A  Recommendation:  Patient will benefit from ongoing skilled OT services in home health setting to continue to advance functional skills in the area of BADL and Reduce care partner burden.  Equipment: 3 in 1  Reasons for discharge: treatment goals met and discharge from hospital  Patient/family agrees with progress made and goals achieved: Yes  OT Discharge Precautions/Restrictions  Precautions Precautions: Fall;Back Required Braces or Orthoses: Spinal Brace Spinal Brace: Lumbar corset;Applied in sitting position Restrictions Weight Bearing Restrictions: No Vital Signs Therapy Vitals Pulse Rate: 74 BP: (!) 111/53 Oxygen Therapy SpO2: 99 % Pain Pain Assessment Pain Scale: 0-10 Pain Score: 0-No pain ADL ADL Equipment Provided: Reacher, Long-handled sponge Eating: Supervision/safety, Set up Where Assessed-Eating: Chair Grooming: Independent Where Assessed-Grooming: Sitting at sink Upper Body Bathing: Setup, Supervision/safety Where Assessed-Upper Body Bathing: Sitting at sink Lower Body Bathing: Minimal assistance Where Assessed-Lower Body Bathing: Sitting at sink, Standing at sink Upper Body Dressing: Setup, Minimal assistance Where Assessed-Upper Body Dressing: Sitting at sink Lower Body Dressing: Moderate assistance Where Assessed-Lower Body Dressing: Sitting  at sink, Standing at sink Toileting: Contact guard Where Assessed-Toileting: Glass blower/designer: Close supervision Armed forces technical officer Method: Counselling psychologist: Bedside commode(RW) Social research officer, government: Environmental education officer Method: Heritage manager: Radio broadcast assistant, Grab bars ADL Comments: Total assist for all due to lethargy and decreased ability to follow commands Vision Baseline Vision/History: Wears glasses Wears Glasses: At all times Patient Visual Report: No change from baseline Vision Assessment?: No apparent visual deficits Cognition Overall Cognitive Status: History of cognitive impairments - at baseline Arousal/Alertness: Lethargic(able to maintain arousal during activity) Orientation Level: Oriented to person;Oriented to place;Disoriented to time;Oriented to situation Attention: Sustained Sustained Attention: Impaired Sustained Attention Impairment: Functional basic Memory: Impaired Memory Impairment: Decreased recall of new information Awareness: Impaired Awareness Impairment: Emergent impairment Problem Solving: Impaired Problem Solving Impairment: Functional basic Executive Function: Sequencing;Initiating;Self Correcting;Self Monitoring Sequencing: Impaired Sequencing Impairment: Functional basic Initiating: Impaired Initiating Impairment: Functional basic Self Monitoring: Impaired Self Correcting: Impaired Safety/Judgment: Impaired Sensation Sensation Light Touch: Appears Intact Proprioception: Appears Intact Coordination Gross Motor Movements are Fluid and Coordinated: No Fine Motor Movements are Fluid and Coordinated: No Coordination and Movement Description: extremely slow Finger Nose Finger Test: slow but able to complete Motor  Motor Motor: Other (comment) Motor - Discharge Observations: generalized weakness Mobility  Bed Mobility Bed Mobility: Rolling Right;Rolling Left;Supine to  Sit;Sit to Supine Rolling Right: Supervision/verbal cueing Rolling Left: Supervision/Verbal cueing Supine to Sit: Supervision/Verbal cueing;Contact Guard/Touching assist Sit to Supine: Supervision/Verbal cueing Transfers Sit to Stand: Supervision/Verbal cueing Stand to Sit: Supervision/Verbal cueing  Trunk/Postural Assessment  Cervical Assessment Cervical Assessment: Within Functional Limits Thoracic Assessment Thoracic Assessment: (flexed posture; back precautions) Lumbar Assessment Lumbar Assessment: Exceptions to WFL(back precautions) Postural Control Postural Control: Deficits on evaluation Righting Reactions: delayed  Balance Balance Balance Assessed: Yes Static Sitting Balance Static Sitting - Level of Assistance: 5: Stand by assistance  Dynamic Sitting Balance Dynamic Sitting - Level of Assistance: 5: Stand by assistance Static Standing Balance Static Standing - Level of Assistance: 5: Stand by assistance Static Standing - Comment/# of Minutes: with UE support Dynamic Standing Balance Dynamic Standing - Level of Assistance: 5: Stand by assistance(Supervision to CGA pending fatigue) Extremity/Trunk Assessment RUE Assessment RUE Assessment: Within Functional Limits General Strength Comments: grossly 4/5 LUE Assessment LUE Assessment: Within Functional Limits General Strength Comments: grossly 4/5   Sullivan Jacuinde, [place] 06/28/2019, 10:00 AM

## 2019-06-28 NOTE — Progress Notes (Signed)
[facility] PHYSICAL MEDICINE & REHABILITATION PROGRESS NOTE   Subjective/Complaints:  Pt reports had BM yesterday- per chart, did have large BM, but daughter refused Mg citrate before the BM occurred- not sure why.  York SpanielSaid was uncomfortable yesterday but not now, since had BM  Is due to go home Saturday.  Objective:  Physical Exam: Vital Signs Blood pressure (!) 111/53, pulse 74, temperature 98.8 F (37.1 C), temperature source Oral, resp. rate 18, height 5' 9.5" (1.765 m), weight 93 kg, SpO2 99 %.  Vitals and labs reviewed Elderly, female sitting up in manual w/c, in gym, with PT, ready to do car transfers, NAD  HEENT exam atraumatic, normocephalic, hair in braids still, Wearing glasses  Neck is supple. No jugular venous distention no thyromegaly.  Chest clear to auscultation B/L  Cardiac RRR  Abdominal exam active bowel sounds, soft, nontender. Slightly protuberant/distended, maybe not as distended, but is protuberant  Extremities no edema.  Neurologic exam she is alert, Ox2-    Assessment/Plan: 1. Functional deficits secondary to L1-L3 laninotomy/PLIF  Medical Problem List and Plan: 1.Decreased functional mobilitysecondary to L2-3 spondylolisthesis, L1-2 and 2-3 spinal stenosis compressing L2 and III nerve roots with lumbar radiculopathy. Status post L2-3 and L1-2 laminotomy foraminotomies posterior lumbar interbody fusion 06/05/2019. Continue inpatient rehab. 2. Antithrombotics: -DVT/anticoagulation:SCDs.   Vascular study negative for DVT on 8/11 -antiplatelet therapy: N/A 3. Pain Management:Well-controlled. 4. Mood:Rizadyne16 mg daily  5. Neuropsych: This patientiscapable of making decisions on her own behalf. 6. Skin/Wound Care:Routine skin checks  8/17- lumbar incision healing well 7. Fluids/Electrolytes/Nutrition:Routine in and outs  Basic Metabolic Panel:    Component Value Date/Time   NA 135 06/28/2019 0455   NA 142  05/22/2017 1437   K 4.2 06/28/2019 0455   CL 102 06/28/2019 0455   CO2 23 06/28/2019 0455   BUN 38 (H) 06/28/2019 0455   BUN 18 05/22/2017 1437   CREATININE 1.39 (H) 06/28/2019 0455   GLUCOSE 122 (H) 06/28/2019 0455   CALCIUM 8.9 06/28/2019 0455   8. ID.  Completed cephalexin for serosanguineous drainage from the back wound. 9.Acute on chronic anemia. Continue iron supplement CBC:    Component Value Date/Time   WBC 5.6 06/25/2019 0629   HGB 8.2 (L) 06/25/2019 0629   HCT 25.0 (L) 06/25/2019 0629   PLT 262 06/25/2019 0629   MCV 89.0 06/25/2019 0629   NEUTROABS 3.0 06/25/2019 0629   LYMPHSABS 1.9 06/25/2019 0629   MONOABS 0.4 06/25/2019 0629   EOSABS 0.3 06/25/2019 0629   BASOSABS 0.0 06/25/2019 0629     10.Diabetes mellitus. Hemoglobin A1c 6.5.  Adequate control. CBG (last 3)  Recent Labs    06/27/19 1626 06/27/19 2100 06/28/19 0609  GLUCAP 140* 143* 114*   Variable control.  Continue current therapy.  8/24- BGs better since restarted metformin- no diarrhea- had ONE loose stool, which is OK, considering no BM in 6 days.  8/25- will d/c metformin since Cr has bumped to 1.97 and BUN 57 11.Hypertension. Avapro 150 mg daily, HCTZ 12.5 mg daily, Norvasc 10 mg daily. Monitor with increased mobility  8/25- BP on low side- probably not drinking enough, and then getting HCTZ.   8/26- will con't to hold Avapro and Norvasc for now- BP on low side still  8/27- d/c'd Avapro and HCTZ since BP on low side and want to prevent renal side effects. Vitals:   06/28/19 0528 06/28/19 0759  BP: (!) 120/57 (!) 111/53  Pulse: 70 74  Resp: 18   Temp: 98.8  F (37.1 C)   SpO2: 100% 99%  We will continue to monitor. 12.CKD stage II.  Lab Results  Component Value Date   CREATININE 1.39 (H) 06/28/2019    8/25- will give IVFs 80cc/hr x 2 4 hrs and recheck- will hold Metformin and hold Norvasc/Avapro tomorrow as well.  8/26- con't IVFs for another day- BUN down to 51 and Cr  down to 1.67  8/27- will stop IVFs since Bun down to 43 and Cr down to her baseline of 1.5- however still push fluids and d/c BP meds- to see how see will do at home off IVFs.  8/28- BUN better at 38 and Cr down to 1.39- off IVFs- improving, so feel better about d/c tomorrow. 13. Hyperlipidemia. Crestor 14. Constipation  8/24- pt's LBM Sun early AM- daughter wants meds held, which is not appropriate at this time- will have team d/w daughter.  8/26- no BM since Sunday- asked nursing to use prns and give Sorbitol 60cc that I've ordered  8/27- Ordered Mg citrate and suppository today since not stooling.  8/28- pt eventually stooled yesterday- daughter refused mg citrate. 15. Hypocalcemia  8/24- Ca 7.0- will add Ca Carbonate 1x/day  8/27- Ca up to 8.8 15. Dispo- home with daughter  8/19- team conference today- d/c 9/1  8/26- team conference today- d/c date was moved up per daughter's wishes to 8/29  8/28- d/c tomorrow       LOS: 18 days A FACE TO FACE EVALUATION WAS PERFORMED  Avalynn Bowe 06/28/2019, 9:58 AM

## 2019-06-28 NOTE — Plan of Care (Signed)
  Problem: Consults Goal: RH SPINAL CORD INJURY PATIENT EDUCATION Description:  See Patient Education module for education specifics.  Outcome: Progressing   Problem: SCI BOWEL ELIMINATION Goal: RH STG MANAGE BOWEL WITH ASSISTANCE Description: STG Manage Bowel with min Assistance. Outcome: Progressing Goal: RH STG SCI MANAGE BOWEL WITH MEDICATION WITH ASSISTANCE Description: STG SCI Manage bowel with medication with  mod I assistance. Outcome: Progressing Goal: RH STG SCI MANAGE BOWEL PROGRAM W/ASSIST OR AS APPROPRIATE Description: STG SCI Manage bowel program w/min assist or as appropriate. Outcome: Progressing   Problem: SCI BLADDER ELIMINATION Goal: RH STG MANAGE BLADDER WITH ASSISTANCE Description: STG Manage Bladder With min Assistance Outcome: Progressing   Problem: RH SKIN INTEGRITY Goal: RH STG MAINTAIN SKIN INTEGRITY WITH ASSISTANCE Description: STG Maintain Skin Integrity With min Assistance. Outcome: Progressing Goal: RH STG ABLE TO PERFORM INCISION/WOUND CARE W/ASSISTANCE Description: STG Able To Perform Incision/Wound Care With min Assistance. Outcome: Progressing   Problem: RH SAFETY Goal: RH STG ADHERE TO SAFETY PRECAUTIONS W/ASSISTANCE/DEVICE Description: STG Adhere to Safety Precautions With cues/reminders Outcome: Progressing   Problem: RH PAIN MANAGEMENT Goal: RH STG PAIN MANAGED AT OR BELOW PT'S PAIN GOAL Description: At or below level 4 Outcome: Progressing   Problem: RH KNOWLEDGE DEFICIT SCI Goal: RH STG INCREASE KNOWLEDGE OF SELF CARE AFTER SCI Description: Pt will be able to direct care using handouts/educational materials with cues/reminders and caregiver will be able to provide care using educational information independently Outcome: Progressing

## 2019-06-29 LAB — GLUCOSE, CAPILLARY: Glucose-Capillary: 114 mg/dL — ABNORMAL HIGH (ref 70–99)

## 2019-06-29 NOTE — Progress Notes (Signed)
[facility] PHYSICAL MEDICINE & REHABILITATION PROGRESS NOTE   Subjective/Complaints:  No new complaints. Excited to go home today  ROS: Patient denies fever, rash, sore throat, blurred vision, nausea, vomiting, diarrhea, cough, shortness of breath or chest pain,   headache, or mood change.    Objective:  Physical Exam: Vital Signs Blood pressure (!) 119/55, pulse 75, temperature 99.1 F (37.3 C), temperature source Oral, resp. rate 16, height 5' 9.5" (1.765 m), weight 93 kg, SpO2 98 %.  Constitutional: No distress . Vital signs reviewed. HEENT: EOMI, oral membranes moist Neck: supple Cardiovascular: RRR without murmur. No JVD    Respiratory: CTA Bilaterally without wheezes or rales. Normal effort    GI: BS +, non-tender, non-distended   Extremities no edema.  Neurologic exam she is alert, Ox2- Moves all 4's   Assessment/Plan: 1. Functional deficits secondary to L1-L3 laninotomy/PLIF  Medical Problem List and Plan: 1.Decreased functional mobilitysecondary to L2-3 spondylolisthesis, L1-2 and 2-3 spinal stenosis compressing L2 and III nerve roots with lumbar radiculopathy. Status post L2-3 and L1-2 laminotomy foraminotomies posterior lumbar interbody fusion 06/05/2019.   -dc home today  -Patient to see Rehab MD/provider in the office for transitional care encounter in 1-2 weeks.  2. Antithrombotics: -DVT/anticoagulation:SCDs.   Vascular study negative for DVT on 8/11 -antiplatelet therapy: N/A 3. Pain Management:Well-controlled. 4. Mood:Rizadyne16 mg daily  5. Neuropsych: This patientiscapable of making decisions on her own behalf. 6. Skin/Wound Care:Routine skin checks  8/17- lumbar incision healing well 7. Fluids/Electrolytes/Nutrition:Routine in and outs  Basic Metabolic Panel:    Component Value Date/Time   NA 135 06/28/2019 0455   NA 142 05/22/2017 1437   K 4.2 06/28/2019 0455   CL 102 06/28/2019 0455   CO2 23 06/28/2019 0455    BUN 38 (H) 06/28/2019 0455   BUN 18 05/22/2017 1437   CREATININE 1.39 (H) 06/28/2019 0455   GLUCOSE 122 (H) 06/28/2019 0455   CALCIUM 8.9 06/28/2019 0455   8. ID.  Completed cephalexin for serosanguineous drainage from the back wound. 9.Acute on chronic anemia. Continue iron supplement CBC:    Component Value Date/Time   WBC 5.6 06/25/2019 0629   HGB 8.2 (L) 06/25/2019 0629   HCT 25.0 (L) 06/25/2019 0629   PLT 262 06/25/2019 0629   MCV 89.0 06/25/2019 0629   NEUTROABS 3.0 06/25/2019 0629   LYMPHSABS 1.9 06/25/2019 0629   MONOABS 0.4 06/25/2019 0629   EOSABS 0.3 06/25/2019 0629   BASOSABS 0.0 06/25/2019 0629     10.Diabetes mellitus. Hemoglobin A1c 6.5.  Adequate control. CBG (last 3)  Recent Labs    06/28/19 1647 06/28/19 2117 06/29/19 0550  GLUCAP 125* 134* 114*   Improved control---continue regimen  8/24- BGs better since restarted metformin- no diarrhea- had ONE loose stool, which is OK, considering no BM in 6 days.  8/25- will d/c metformin since Cr has bumped to 1.97 and BUN 57 11.Hypertension. Avapro 150 mg daily, HCTZ 12.5 mg daily, Norvasc 10 mg daily. Monitor with increased mobility  8/25- BP on low side- probably not drinking enough, and then getting HCTZ.   8/26- will con't to hold Avapro and Norvasc for now- BP on low side still  8/27- d/c'd Avapro and HCTZ since BP on low side and want to prevent renal side effects. Vitals:   06/28/19 1923 06/29/19 0400  BP: (!) 130/52 (!) 119/55  Pulse: 82 75  Resp: 18 16  Temp: 99.2 F (37.3 C) 99.1 F (37.3 C)  SpO2: 100% 98%  bp controlled  8/29 12.CKD stage II.  Lab Results  Component Value Date   CREATININE 1.39 (H) 06/28/2019    8/25- will give IVFs 80cc/hr x 2 4 hrs and recheck- will hold Metformin and hold Norvasc/Avapro tomorrow as well.  8/26- con't IVFs for another day- BUN down to 51 and Cr down to 1.67  8/27- will stop IVFs since Bun down to 43 and Cr down to her baseline of 1.5-  however still push fluids and d/c BP meds- to see how see will do at home off IVFs.  8/28- BUN better at 38 and Cr down to 1.39- off IVFs- improving, so feel better about d/c tomorrow. 13. Hyperlipidemia. Crestor 14. Constipation  8/24- pt's LBM Sun early AM- daughter wants meds held, which is not appropriate at this time- will have team d/w daughter.  8/26- no BM since Sunday- asked nursing to use prns and give Sorbitol 60cc that I've ordered  8/27- Ordered Mg citrate and suppository today since not stooling.  8/28- pt eventually stooled yesterday- daughter refused mg citrate. 15. Hypocalcemia  8/24- Ca 7.0- will add Ca Carbonate 1x/day  8/27- Ca up to 8.8 15. Dispo- home with daughter today      LOS: 19 days A FACE TO FACE EVALUATION WAS PERFORMED  Ranelle OysterZachary T Swartz 06/29/2019, 8:30 AM

## 2019-06-29 NOTE — Progress Notes (Signed)
Patient discharged via wheelchair by staff with daughter by side. Patient and daughter verbalizes understanding of discharge instructions. Patient denies pain or distress at this time.

## 2019-06-29 NOTE — Plan of Care (Signed)
All goals met and patient and family verbalizes understanding.

## 2019-07-01 DIAGNOSIS — D631 Anemia in chronic kidney disease: Secondary | ICD-10-CM | POA: Diagnosis not present

## 2019-07-01 DIAGNOSIS — Z7982 Long term (current) use of aspirin: Secondary | ICD-10-CM | POA: Diagnosis not present

## 2019-07-01 DIAGNOSIS — I6789 Other cerebrovascular disease: Secondary | ICD-10-CM | POA: Diagnosis not present

## 2019-07-01 DIAGNOSIS — M5416 Radiculopathy, lumbar region: Secondary | ICD-10-CM | POA: Diagnosis not present

## 2019-07-01 DIAGNOSIS — Z981 Arthrodesis status: Secondary | ICD-10-CM | POA: Diagnosis not present

## 2019-07-01 DIAGNOSIS — Z7984 Long term (current) use of oral hypoglycemic drugs: Secondary | ICD-10-CM | POA: Diagnosis not present

## 2019-07-01 DIAGNOSIS — K5903 Drug induced constipation: Secondary | ICD-10-CM | POA: Diagnosis not present

## 2019-07-01 DIAGNOSIS — Z4789 Encounter for other orthopedic aftercare: Secondary | ICD-10-CM | POA: Diagnosis not present

## 2019-07-01 DIAGNOSIS — N182 Chronic kidney disease, stage 2 (mild): Secondary | ICD-10-CM | POA: Diagnosis not present

## 2019-07-01 DIAGNOSIS — R410 Disorientation, unspecified: Secondary | ICD-10-CM | POA: Diagnosis not present

## 2019-07-01 DIAGNOSIS — I129 Hypertensive chronic kidney disease with stage 1 through stage 4 chronic kidney disease, or unspecified chronic kidney disease: Secondary | ICD-10-CM | POA: Diagnosis not present

## 2019-07-01 DIAGNOSIS — E785 Hyperlipidemia, unspecified: Secondary | ICD-10-CM | POA: Diagnosis not present

## 2019-07-01 DIAGNOSIS — E1122 Type 2 diabetes mellitus with diabetic chronic kidney disease: Secondary | ICD-10-CM | POA: Diagnosis not present

## 2019-07-02 ENCOUNTER — Telehealth: Payer: Self-pay

## 2019-07-02 NOTE — Telephone Encounter (Signed)
  [facility]  Patient Name: Athaliah Baumbach DOB: [birth year] Appointment Date and Time: 07-11-2019 / 11:40am With: Dr. Dagoberto Ligas  Questions for our staff to ask patients on Transitional care 48 hour phone call:   1. Are you/is patient experiencing any problems since coming home? Are there any questions regarding any aspect of care? NO  2. Are there any questions regarding medications administration/dosing? Are meds being taken as prescribed? Patient should review meds with caller to confirm NO new ISsues  3. Have there been any falls? NO  4. Has Home Health been to the house and/or have they contacted you? If not, have you tried to contact them? Can we help you contact them? YES  5. Are bowels and bladder emptying properly? Are there any unexpected incontinence issues? If applicable, is patient following bowel/bladder programs? YES  6. Any fevers, problems with breathing, unexpected pain? NO  7. Are there any skin problems or new areas of breakdown? NO  8. Has the patient/family member arranged specialty MD follow up (ie cardiology/neurology/renal/surgical/etc)? Can we help arrange? YES  9. Does the patient need any other services or support that we can help arrange? NO  10. Are caregivers following through as expected in assisting the patient? YES  11. Has the patient quit smoking, drinking alcohol, or using drugs as recommended? NA  [facility] [address] [phone]

## 2019-07-04 DIAGNOSIS — H3561 Retinal hemorrhage, right eye: Secondary | ICD-10-CM | POA: Diagnosis not present

## 2019-07-04 DIAGNOSIS — H43821 Vitreomacular adhesion, right eye: Secondary | ICD-10-CM | POA: Diagnosis not present

## 2019-07-04 DIAGNOSIS — H59031 Cystoid macular edema following cataract surgery, right eye: Secondary | ICD-10-CM | POA: Diagnosis not present

## 2019-07-04 DIAGNOSIS — E113311 Type 2 diabetes mellitus with moderate nonproliferative diabetic retinopathy with macular edema, right eye: Secondary | ICD-10-CM | POA: Diagnosis not present

## 2019-07-05 DIAGNOSIS — E1122 Type 2 diabetes mellitus with diabetic chronic kidney disease: Secondary | ICD-10-CM | POA: Diagnosis not present

## 2019-07-05 DIAGNOSIS — G4733 Obstructive sleep apnea (adult) (pediatric): Secondary | ICD-10-CM | POA: Diagnosis not present

## 2019-07-05 DIAGNOSIS — Z4789 Encounter for other orthopedic aftercare: Secondary | ICD-10-CM | POA: Diagnosis not present

## 2019-07-05 DIAGNOSIS — N182 Chronic kidney disease, stage 2 (mild): Secondary | ICD-10-CM | POA: Diagnosis not present

## 2019-07-05 DIAGNOSIS — I10 Essential (primary) hypertension: Secondary | ICD-10-CM | POA: Diagnosis not present

## 2019-07-05 DIAGNOSIS — D649 Anemia, unspecified: Secondary | ICD-10-CM | POA: Diagnosis not present

## 2019-07-05 DIAGNOSIS — M5136 Other intervertebral disc degeneration, lumbar region: Secondary | ICD-10-CM | POA: Diagnosis not present

## 2019-07-05 DIAGNOSIS — R413 Other amnesia: Secondary | ICD-10-CM | POA: Diagnosis not present

## 2019-07-05 DIAGNOSIS — E1169 Type 2 diabetes mellitus with other specified complication: Secondary | ICD-10-CM | POA: Diagnosis not present

## 2019-07-05 DIAGNOSIS — K219 Gastro-esophageal reflux disease without esophagitis: Secondary | ICD-10-CM | POA: Diagnosis not present

## 2019-07-05 DIAGNOSIS — R300 Dysuria: Secondary | ICD-10-CM | POA: Diagnosis not present

## 2019-07-05 DIAGNOSIS — I129 Hypertensive chronic kidney disease with stage 1 through stage 4 chronic kidney disease, or unspecified chronic kidney disease: Secondary | ICD-10-CM | POA: Diagnosis not present

## 2019-07-05 DIAGNOSIS — M5416 Radiculopathy, lumbar region: Secondary | ICD-10-CM | POA: Diagnosis not present

## 2019-07-05 DIAGNOSIS — D631 Anemia in chronic kidney disease: Secondary | ICD-10-CM | POA: Diagnosis not present

## 2019-07-09 ENCOUNTER — Other Ambulatory Visit: Payer: Self-pay | Admitting: Internal Medicine

## 2019-07-09 DIAGNOSIS — D631 Anemia in chronic kidney disease: Secondary | ICD-10-CM | POA: Diagnosis not present

## 2019-07-09 DIAGNOSIS — M5416 Radiculopathy, lumbar region: Secondary | ICD-10-CM | POA: Diagnosis not present

## 2019-07-09 DIAGNOSIS — N390 Urinary tract infection, site not specified: Secondary | ICD-10-CM | POA: Diagnosis not present

## 2019-07-09 DIAGNOSIS — E1122 Type 2 diabetes mellitus with diabetic chronic kidney disease: Secondary | ICD-10-CM | POA: Diagnosis not present

## 2019-07-09 DIAGNOSIS — Z4789 Encounter for other orthopedic aftercare: Secondary | ICD-10-CM | POA: Diagnosis not present

## 2019-07-09 DIAGNOSIS — Z1231 Encounter for screening mammogram for malignant neoplasm of breast: Secondary | ICD-10-CM

## 2019-07-09 DIAGNOSIS — N182 Chronic kidney disease, stage 2 (mild): Secondary | ICD-10-CM | POA: Diagnosis not present

## 2019-07-09 DIAGNOSIS — I129 Hypertensive chronic kidney disease with stage 1 through stage 4 chronic kidney disease, or unspecified chronic kidney disease: Secondary | ICD-10-CM | POA: Diagnosis not present

## 2019-07-09 DIAGNOSIS — R300 Dysuria: Secondary | ICD-10-CM | POA: Diagnosis not present

## 2019-07-11 ENCOUNTER — Encounter: Payer: Medicare Other | Admitting: Physical Medicine and Rehabilitation

## 2019-07-11 DIAGNOSIS — N182 Chronic kidney disease, stage 2 (mild): Secondary | ICD-10-CM | POA: Diagnosis not present

## 2019-07-11 DIAGNOSIS — M5416 Radiculopathy, lumbar region: Secondary | ICD-10-CM | POA: Diagnosis not present

## 2019-07-11 DIAGNOSIS — Z4789 Encounter for other orthopedic aftercare: Secondary | ICD-10-CM | POA: Diagnosis not present

## 2019-07-11 DIAGNOSIS — E1122 Type 2 diabetes mellitus with diabetic chronic kidney disease: Secondary | ICD-10-CM | POA: Diagnosis not present

## 2019-07-11 DIAGNOSIS — D631 Anemia in chronic kidney disease: Secondary | ICD-10-CM | POA: Diagnosis not present

## 2019-07-11 DIAGNOSIS — I129 Hypertensive chronic kidney disease with stage 1 through stage 4 chronic kidney disease, or unspecified chronic kidney disease: Secondary | ICD-10-CM | POA: Diagnosis not present

## 2019-07-12 ENCOUNTER — Encounter
Payer: Medicare Other | Attending: Physical Medicine and Rehabilitation | Admitting: Physical Medicine and Rehabilitation

## 2019-07-12 ENCOUNTER — Encounter: Payer: Self-pay | Admitting: Physical Medicine and Rehabilitation

## 2019-07-12 ENCOUNTER — Other Ambulatory Visit: Payer: Self-pay

## 2019-07-12 VITALS — BP 147/81 | HR 74 | Temp 98.7°F | Ht 68.5 in | Wt 212.0 lb

## 2019-07-12 DIAGNOSIS — M4316 Spondylolisthesis, lumbar region: Secondary | ICD-10-CM | POA: Diagnosis not present

## 2019-07-12 DIAGNOSIS — K5903 Drug induced constipation: Secondary | ICD-10-CM | POA: Diagnosis not present

## 2019-07-12 DIAGNOSIS — E7849 Other hyperlipidemia: Secondary | ICD-10-CM | POA: Diagnosis not present

## 2019-07-12 DIAGNOSIS — Z23 Encounter for immunization: Secondary | ICD-10-CM | POA: Diagnosis not present

## 2019-07-12 DIAGNOSIS — Z6833 Body mass index (BMI) 33.0-33.9, adult: Secondary | ICD-10-CM | POA: Diagnosis not present

## 2019-07-12 DIAGNOSIS — I10 Essential (primary) hypertension: Secondary | ICD-10-CM | POA: Diagnosis not present

## 2019-07-12 DIAGNOSIS — E1169 Type 2 diabetes mellitus with other specified complication: Secondary | ICD-10-CM | POA: Diagnosis not present

## 2019-07-12 DIAGNOSIS — M5136 Other intervertebral disc degeneration, lumbar region: Secondary | ICD-10-CM | POA: Insufficient documentation

## 2019-07-12 NOTE — Progress Notes (Signed)
Subjective:    Patient ID: [name] Mathews, female    DOB: 02/12/1934, 85 y.o.   MRN: [id number]  HPI  CC: lumbar radiculopathy  Pt is an 85 yr old female With lumbar radiculopathy and UTI with urinary incontinence- lately.   PT is coming to the house- working on walking and leg lifts and squats- now walking without RW- walking 1 block outside without RW.   OT- didn't want that particular OT- trying to get someone else. Could use for some ROM, but not ADLs   NO PAIN  Has the whole basement 2100 sq feet- in basement- keep drinking water.  PCP felt Metformin was contributing to her kidney function.  Likes the grape /berry propel and aquafina and dasani- doesn't like crystal light herself.   Likes Watermelon Aloe water- helps with constipation. Gave her 4oz alkaline water as well.  Per daughter- pretty much self reliant-  Put own shoes on; has problems with socks (not wearing socks) Brushing teeth, washing face; Washing self in shower- has shower chair. (has handicapped bars in shower); all commodes the comfort height. Gets on/off bed well.   Other pt's daughter has been there x 2 weeks- assisting with shower- caregiver will be back next week.   BMs much better- Takes Linzess- Only gives Linzess as needed. Also does Flaxseed oil and probiotics and coffee- going to bathroom by 10am. Also back on aloe- got iron and Linzess every other day. Appetite is back- veggies are being eaten. Gives immune support since home from hospital.   Started Macrobid for UTI 2x/day for 7 days starting this past Wednesday.  Going to do CBC/BMP today, a flu shot; and going to check Magnesium By PCP. To see in 4 weeks. Virtual visit last week. BGs doing OK since off Metformin- will check A1c in 4 weeks.  Had back surgery due to RLE pain which has been gone since day of surgery.    Pain Inventory Average Pain 0 Pain Right Now 0 My pain is n/a  In the last 24 hours, has pain interfered  with the following? General activity 9 Relation with others 9 Enjoyment of life 10 What TIME of day is your pain at its worst? n/a Sleep (in general) Good  Pain is worse with: n/a Pain improves with: n/a Relief from Meds: n/a  Mobility use a cane use a walker how many minutes can you walk? 10-15 ability to climb steps?  yes do you drive?  no Do you have any goals in this area?  yes  Function retired I need assistance with the following:  bathing and household duties  Neuro/Psych bladder control problems  Prior Studies Any changes since last visit?  yes x-rays  Physicians involved in your care Any changes since last visit?  yes Primary care Dr. Joanie Coddington   No family history on file. Social History   Socioeconomic History   Marital status: Widowed    Spouse name: Not on file   Number of children: Not on file   Years of education: Not on file   Highest education level: Not on file  Occupational History   Not on file  Social Needs   Financial resource strain: Not on file   Food insecurity    Worry: Not on file    Inability: Not on file   Transportation needs    Medical: Not on file    Non-medical: Not on file  Tobacco Use   Smoking status: Never Smoker  Smokeless tobacco: Never Used  Substance and Sexual Activity   Alcohol use: No   Drug use: No   Sexual activity: Not on file  Lifestyle   Physical activity    Days per week: Not on file    Minutes per session: Not on file   Stress: Not on file  Relationships   Social connections    Talks on phone: Not on file    Gets together: Not on file    Attends religious service: Not on file    Active member of club or organization: Not on file    Attends meetings of clubs or organizations: Not on file    Relationship status: Not on file  Other Topics Concern   Not on file  Social History Narrative   Not on file   Past Surgical History:  Procedure Laterality Date   APPENDECTOMY       BACK SURGERY     BREAST CYST EXCISION Left    BREAST SURGERY     cyst removed left side   BUNIONECTOMY     CARDIOVASCULAR STRESS TEST     COLONOSCOPY     DILATION AND CURETTAGE OF UTERUS     TONSILLECTOMY     Past Medical History:  Diagnosis Date   Anemia    hx of   Arthritis    Breast lump    hx of   Cataract    left eye   Chronic back pain    Diabetes mellitus    Hyperlipidemia    Hypertension    [facility]   Normochromic normocytic anemia 05/22/2017   Hb 10 MCV 85.6 No monoclonal spike on SPEP  Creatinine 1.3-1.6   Peripheral vascular disease (HCC)    There were no vitals taken for this visit.  Opioid Risk Score:   Fall Risk Score:  `1  Depression screen PHQ 2/9  Depression screen PHQ 2/9 05/22/2017  Decreased Interest 0  Down, Depressed, Hopeless 0  PHQ - 2 Score 0    Review of Systems  Constitutional: Negative.   HENT: Negative.   Eyes: Negative.   Respiratory: Negative.   Cardiovascular: Negative.   Gastrointestinal: Negative.   Endocrine: Negative.   Genitourinary: Negative.   Musculoskeletal: Negative.   Skin: Negative.   Allergic/Immunologic: Negative.   Neurological: Negative.   Hematological: Negative.   Psychiatric/Behavioral: Negative.   All other systems reviewed and are negative.      Objective:   Physical Exam Awake, more alert, sitting up on table; able to stand slowly by self, NAD   MS: 5/5 in UEs and 5-/5 in LEs  Trace LE/foot edema Wearing fabric TLSO Neuro- no decrease in sensation to LT B/L in LEs or UEs  Skin- almost completely healed in lumbar incision- 1 little tiny scab, but otherwise scarred.        Assessment & Plan:    1. Lumbar radiculopathy- s/p fusion- healed and doing great- can even do stairs now  2. UTI- on Macrobid  3. Constipation- resolved  4. DM- off Metformin- per PCP  5. Dispo- f/u as needed

## 2019-07-12 NOTE — Patient Instructions (Signed)
  1. Lumbar radiculopathy- s/p fusion- healed and doing great- can even do stairs now  2. UTI- on Macrobid  3. Constipation- resolved  4. DM- off Metformin- per PCP  5. Dispo- f/u as needed

## 2019-07-15 DIAGNOSIS — D631 Anemia in chronic kidney disease: Secondary | ICD-10-CM | POA: Diagnosis not present

## 2019-07-15 DIAGNOSIS — I129 Hypertensive chronic kidney disease with stage 1 through stage 4 chronic kidney disease, or unspecified chronic kidney disease: Secondary | ICD-10-CM | POA: Diagnosis not present

## 2019-07-15 DIAGNOSIS — Z4789 Encounter for other orthopedic aftercare: Secondary | ICD-10-CM | POA: Diagnosis not present

## 2019-07-15 DIAGNOSIS — N182 Chronic kidney disease, stage 2 (mild): Secondary | ICD-10-CM | POA: Diagnosis not present

## 2019-07-15 DIAGNOSIS — M5416 Radiculopathy, lumbar region: Secondary | ICD-10-CM | POA: Diagnosis not present

## 2019-07-15 DIAGNOSIS — E1122 Type 2 diabetes mellitus with diabetic chronic kidney disease: Secondary | ICD-10-CM | POA: Diagnosis not present

## 2019-07-16 DIAGNOSIS — M5416 Radiculopathy, lumbar region: Secondary | ICD-10-CM | POA: Diagnosis not present

## 2019-07-16 DIAGNOSIS — E1122 Type 2 diabetes mellitus with diabetic chronic kidney disease: Secondary | ICD-10-CM | POA: Diagnosis not present

## 2019-07-16 DIAGNOSIS — Z4789 Encounter for other orthopedic aftercare: Secondary | ICD-10-CM | POA: Diagnosis not present

## 2019-07-16 DIAGNOSIS — D631 Anemia in chronic kidney disease: Secondary | ICD-10-CM | POA: Diagnosis not present

## 2019-07-16 DIAGNOSIS — N182 Chronic kidney disease, stage 2 (mild): Secondary | ICD-10-CM | POA: Diagnosis not present

## 2019-07-16 DIAGNOSIS — I129 Hypertensive chronic kidney disease with stage 1 through stage 4 chronic kidney disease, or unspecified chronic kidney disease: Secondary | ICD-10-CM | POA: Diagnosis not present

## 2019-07-18 DIAGNOSIS — N182 Chronic kidney disease, stage 2 (mild): Secondary | ICD-10-CM | POA: Diagnosis not present

## 2019-07-18 DIAGNOSIS — M5416 Radiculopathy, lumbar region: Secondary | ICD-10-CM | POA: Diagnosis not present

## 2019-07-18 DIAGNOSIS — D631 Anemia in chronic kidney disease: Secondary | ICD-10-CM | POA: Diagnosis not present

## 2019-07-18 DIAGNOSIS — I129 Hypertensive chronic kidney disease with stage 1 through stage 4 chronic kidney disease, or unspecified chronic kidney disease: Secondary | ICD-10-CM | POA: Diagnosis not present

## 2019-07-18 DIAGNOSIS — E1122 Type 2 diabetes mellitus with diabetic chronic kidney disease: Secondary | ICD-10-CM | POA: Diagnosis not present

## 2019-07-18 DIAGNOSIS — Z4789 Encounter for other orthopedic aftercare: Secondary | ICD-10-CM | POA: Diagnosis not present

## 2019-07-19 DIAGNOSIS — D631 Anemia in chronic kidney disease: Secondary | ICD-10-CM | POA: Diagnosis not present

## 2019-07-19 DIAGNOSIS — E1122 Type 2 diabetes mellitus with diabetic chronic kidney disease: Secondary | ICD-10-CM | POA: Diagnosis not present

## 2019-07-19 DIAGNOSIS — Z4789 Encounter for other orthopedic aftercare: Secondary | ICD-10-CM | POA: Diagnosis not present

## 2019-07-19 DIAGNOSIS — N182 Chronic kidney disease, stage 2 (mild): Secondary | ICD-10-CM | POA: Diagnosis not present

## 2019-07-19 DIAGNOSIS — M5416 Radiculopathy, lumbar region: Secondary | ICD-10-CM | POA: Diagnosis not present

## 2019-07-19 DIAGNOSIS — I129 Hypertensive chronic kidney disease with stage 1 through stage 4 chronic kidney disease, or unspecified chronic kidney disease: Secondary | ICD-10-CM | POA: Diagnosis not present

## 2019-07-23 DIAGNOSIS — M5416 Radiculopathy, lumbar region: Secondary | ICD-10-CM | POA: Diagnosis not present

## 2019-07-23 DIAGNOSIS — I129 Hypertensive chronic kidney disease with stage 1 through stage 4 chronic kidney disease, or unspecified chronic kidney disease: Secondary | ICD-10-CM | POA: Diagnosis not present

## 2019-07-23 DIAGNOSIS — N182 Chronic kidney disease, stage 2 (mild): Secondary | ICD-10-CM | POA: Diagnosis not present

## 2019-07-23 DIAGNOSIS — E1122 Type 2 diabetes mellitus with diabetic chronic kidney disease: Secondary | ICD-10-CM | POA: Diagnosis not present

## 2019-07-23 DIAGNOSIS — D631 Anemia in chronic kidney disease: Secondary | ICD-10-CM | POA: Diagnosis not present

## 2019-07-23 DIAGNOSIS — R82998 Other abnormal findings in urine: Secondary | ICD-10-CM | POA: Diagnosis not present

## 2019-07-23 DIAGNOSIS — Z4789 Encounter for other orthopedic aftercare: Secondary | ICD-10-CM | POA: Diagnosis not present

## 2019-07-25 DIAGNOSIS — E1122 Type 2 diabetes mellitus with diabetic chronic kidney disease: Secondary | ICD-10-CM | POA: Diagnosis not present

## 2019-07-25 DIAGNOSIS — I129 Hypertensive chronic kidney disease with stage 1 through stage 4 chronic kidney disease, or unspecified chronic kidney disease: Secondary | ICD-10-CM | POA: Diagnosis not present

## 2019-07-25 DIAGNOSIS — D631 Anemia in chronic kidney disease: Secondary | ICD-10-CM | POA: Diagnosis not present

## 2019-07-25 DIAGNOSIS — M5416 Radiculopathy, lumbar region: Secondary | ICD-10-CM | POA: Diagnosis not present

## 2019-07-25 DIAGNOSIS — Z4789 Encounter for other orthopedic aftercare: Secondary | ICD-10-CM | POA: Diagnosis not present

## 2019-07-25 DIAGNOSIS — N182 Chronic kidney disease, stage 2 (mild): Secondary | ICD-10-CM | POA: Diagnosis not present

## 2019-07-26 DIAGNOSIS — E1351 Other specified diabetes mellitus with diabetic peripheral angiopathy without gangrene: Secondary | ICD-10-CM | POA: Diagnosis not present

## 2019-07-26 DIAGNOSIS — L602 Onychogryphosis: Secondary | ICD-10-CM | POA: Diagnosis not present

## 2019-07-29 DIAGNOSIS — Z4789 Encounter for other orthopedic aftercare: Secondary | ICD-10-CM | POA: Diagnosis not present

## 2019-07-29 DIAGNOSIS — I129 Hypertensive chronic kidney disease with stage 1 through stage 4 chronic kidney disease, or unspecified chronic kidney disease: Secondary | ICD-10-CM | POA: Diagnosis not present

## 2019-07-29 DIAGNOSIS — N182 Chronic kidney disease, stage 2 (mild): Secondary | ICD-10-CM | POA: Diagnosis not present

## 2019-07-29 DIAGNOSIS — M5416 Radiculopathy, lumbar region: Secondary | ICD-10-CM | POA: Diagnosis not present

## 2019-07-29 DIAGNOSIS — D631 Anemia in chronic kidney disease: Secondary | ICD-10-CM | POA: Diagnosis not present

## 2019-07-29 DIAGNOSIS — E1122 Type 2 diabetes mellitus with diabetic chronic kidney disease: Secondary | ICD-10-CM | POA: Diagnosis not present

## 2019-07-31 DIAGNOSIS — E1122 Type 2 diabetes mellitus with diabetic chronic kidney disease: Secondary | ICD-10-CM | POA: Diagnosis not present

## 2019-07-31 DIAGNOSIS — K5903 Drug induced constipation: Secondary | ICD-10-CM | POA: Diagnosis not present

## 2019-07-31 DIAGNOSIS — Z981 Arthrodesis status: Secondary | ICD-10-CM | POA: Diagnosis not present

## 2019-07-31 DIAGNOSIS — Z4789 Encounter for other orthopedic aftercare: Secondary | ICD-10-CM | POA: Diagnosis not present

## 2019-07-31 DIAGNOSIS — D631 Anemia in chronic kidney disease: Secondary | ICD-10-CM | POA: Diagnosis not present

## 2019-07-31 DIAGNOSIS — M5416 Radiculopathy, lumbar region: Secondary | ICD-10-CM | POA: Diagnosis not present

## 2019-07-31 DIAGNOSIS — E785 Hyperlipidemia, unspecified: Secondary | ICD-10-CM | POA: Diagnosis not present

## 2019-07-31 DIAGNOSIS — Z7982 Long term (current) use of aspirin: Secondary | ICD-10-CM | POA: Diagnosis not present

## 2019-07-31 DIAGNOSIS — R410 Disorientation, unspecified: Secondary | ICD-10-CM | POA: Diagnosis not present

## 2019-07-31 DIAGNOSIS — Z7984 Long term (current) use of oral hypoglycemic drugs: Secondary | ICD-10-CM | POA: Diagnosis not present

## 2019-07-31 DIAGNOSIS — I6789 Other cerebrovascular disease: Secondary | ICD-10-CM | POA: Diagnosis not present

## 2019-07-31 DIAGNOSIS — I129 Hypertensive chronic kidney disease with stage 1 through stage 4 chronic kidney disease, or unspecified chronic kidney disease: Secondary | ICD-10-CM | POA: Diagnosis not present

## 2019-07-31 DIAGNOSIS — N182 Chronic kidney disease, stage 2 (mild): Secondary | ICD-10-CM | POA: Diagnosis not present

## 2019-08-01 DIAGNOSIS — I129 Hypertensive chronic kidney disease with stage 1 through stage 4 chronic kidney disease, or unspecified chronic kidney disease: Secondary | ICD-10-CM | POA: Diagnosis not present

## 2019-08-01 DIAGNOSIS — M5416 Radiculopathy, lumbar region: Secondary | ICD-10-CM | POA: Diagnosis not present

## 2019-08-01 DIAGNOSIS — Z4789 Encounter for other orthopedic aftercare: Secondary | ICD-10-CM | POA: Diagnosis not present

## 2019-08-01 DIAGNOSIS — D631 Anemia in chronic kidney disease: Secondary | ICD-10-CM | POA: Diagnosis not present

## 2019-08-01 DIAGNOSIS — N182 Chronic kidney disease, stage 2 (mild): Secondary | ICD-10-CM | POA: Diagnosis not present

## 2019-08-01 DIAGNOSIS — E1122 Type 2 diabetes mellitus with diabetic chronic kidney disease: Secondary | ICD-10-CM | POA: Diagnosis not present

## 2019-08-06 DIAGNOSIS — N182 Chronic kidney disease, stage 2 (mild): Secondary | ICD-10-CM | POA: Diagnosis not present

## 2019-08-06 DIAGNOSIS — Z4789 Encounter for other orthopedic aftercare: Secondary | ICD-10-CM | POA: Diagnosis not present

## 2019-08-06 DIAGNOSIS — I129 Hypertensive chronic kidney disease with stage 1 through stage 4 chronic kidney disease, or unspecified chronic kidney disease: Secondary | ICD-10-CM | POA: Diagnosis not present

## 2019-08-06 DIAGNOSIS — E1122 Type 2 diabetes mellitus with diabetic chronic kidney disease: Secondary | ICD-10-CM | POA: Diagnosis not present

## 2019-08-06 DIAGNOSIS — M5416 Radiculopathy, lumbar region: Secondary | ICD-10-CM | POA: Diagnosis not present

## 2019-08-06 DIAGNOSIS — D631 Anemia in chronic kidney disease: Secondary | ICD-10-CM | POA: Diagnosis not present

## 2019-08-07 DIAGNOSIS — M25551 Pain in right hip: Secondary | ICD-10-CM | POA: Diagnosis not present

## 2019-08-07 DIAGNOSIS — M5136 Other intervertebral disc degeneration, lumbar region: Secondary | ICD-10-CM | POA: Diagnosis not present

## 2019-08-07 DIAGNOSIS — K219 Gastro-esophageal reflux disease without esophagitis: Secondary | ICD-10-CM | POA: Diagnosis not present

## 2019-08-07 DIAGNOSIS — R413 Other amnesia: Secondary | ICD-10-CM | POA: Diagnosis not present

## 2019-08-07 DIAGNOSIS — K5900 Constipation, unspecified: Secondary | ICD-10-CM | POA: Diagnosis not present

## 2019-08-07 DIAGNOSIS — D649 Anemia, unspecified: Secondary | ICD-10-CM | POA: Diagnosis not present

## 2019-08-07 DIAGNOSIS — N1831 Chronic kidney disease, stage 3a: Secondary | ICD-10-CM | POA: Diagnosis not present

## 2019-08-07 DIAGNOSIS — Z79899 Other long term (current) drug therapy: Secondary | ICD-10-CM | POA: Diagnosis not present

## 2019-08-07 DIAGNOSIS — R300 Dysuria: Secondary | ICD-10-CM | POA: Diagnosis not present

## 2019-08-07 DIAGNOSIS — E1169 Type 2 diabetes mellitus with other specified complication: Secondary | ICD-10-CM | POA: Diagnosis not present

## 2019-08-07 DIAGNOSIS — N183 Chronic kidney disease, stage 3 unspecified: Secondary | ICD-10-CM | POA: Diagnosis not present

## 2019-08-07 DIAGNOSIS — I129 Hypertensive chronic kidney disease with stage 1 through stage 4 chronic kidney disease, or unspecified chronic kidney disease: Secondary | ICD-10-CM | POA: Diagnosis not present

## 2019-08-07 DIAGNOSIS — N3281 Overactive bladder: Secondary | ICD-10-CM | POA: Diagnosis not present

## 2019-08-08 DIAGNOSIS — D631 Anemia in chronic kidney disease: Secondary | ICD-10-CM | POA: Diagnosis not present

## 2019-08-08 DIAGNOSIS — I129 Hypertensive chronic kidney disease with stage 1 through stage 4 chronic kidney disease, or unspecified chronic kidney disease: Secondary | ICD-10-CM | POA: Diagnosis not present

## 2019-08-08 DIAGNOSIS — E1122 Type 2 diabetes mellitus with diabetic chronic kidney disease: Secondary | ICD-10-CM | POA: Diagnosis not present

## 2019-08-08 DIAGNOSIS — N182 Chronic kidney disease, stage 2 (mild): Secondary | ICD-10-CM | POA: Diagnosis not present

## 2019-08-08 DIAGNOSIS — M5416 Radiculopathy, lumbar region: Secondary | ICD-10-CM | POA: Diagnosis not present

## 2019-08-08 DIAGNOSIS — Z4789 Encounter for other orthopedic aftercare: Secondary | ICD-10-CM | POA: Diagnosis not present

## 2019-08-13 DIAGNOSIS — E1122 Type 2 diabetes mellitus with diabetic chronic kidney disease: Secondary | ICD-10-CM | POA: Diagnosis not present

## 2019-08-13 DIAGNOSIS — N182 Chronic kidney disease, stage 2 (mild): Secondary | ICD-10-CM | POA: Diagnosis not present

## 2019-08-13 DIAGNOSIS — M5416 Radiculopathy, lumbar region: Secondary | ICD-10-CM | POA: Diagnosis not present

## 2019-08-13 DIAGNOSIS — D631 Anemia in chronic kidney disease: Secondary | ICD-10-CM | POA: Diagnosis not present

## 2019-08-13 DIAGNOSIS — I129 Hypertensive chronic kidney disease with stage 1 through stage 4 chronic kidney disease, or unspecified chronic kidney disease: Secondary | ICD-10-CM | POA: Diagnosis not present

## 2019-08-13 DIAGNOSIS — Z4789 Encounter for other orthopedic aftercare: Secondary | ICD-10-CM | POA: Diagnosis not present

## 2019-08-15 DIAGNOSIS — N182 Chronic kidney disease, stage 2 (mild): Secondary | ICD-10-CM | POA: Diagnosis not present

## 2019-08-15 DIAGNOSIS — E1122 Type 2 diabetes mellitus with diabetic chronic kidney disease: Secondary | ICD-10-CM | POA: Diagnosis not present

## 2019-08-15 DIAGNOSIS — D631 Anemia in chronic kidney disease: Secondary | ICD-10-CM | POA: Diagnosis not present

## 2019-08-15 DIAGNOSIS — R413 Other amnesia: Secondary | ICD-10-CM | POA: Diagnosis not present

## 2019-08-15 DIAGNOSIS — N183 Chronic kidney disease, stage 3 unspecified: Secondary | ICD-10-CM | POA: Diagnosis not present

## 2019-08-15 DIAGNOSIS — D649 Anemia, unspecified: Secondary | ICD-10-CM | POA: Diagnosis not present

## 2019-08-15 DIAGNOSIS — I129 Hypertensive chronic kidney disease with stage 1 through stage 4 chronic kidney disease, or unspecified chronic kidney disease: Secondary | ICD-10-CM | POA: Diagnosis not present

## 2019-08-15 DIAGNOSIS — Z4789 Encounter for other orthopedic aftercare: Secondary | ICD-10-CM | POA: Diagnosis not present

## 2019-08-15 DIAGNOSIS — M5416 Radiculopathy, lumbar region: Secondary | ICD-10-CM | POA: Diagnosis not present

## 2019-08-20 DIAGNOSIS — I129 Hypertensive chronic kidney disease with stage 1 through stage 4 chronic kidney disease, or unspecified chronic kidney disease: Secondary | ICD-10-CM | POA: Diagnosis not present

## 2019-08-20 DIAGNOSIS — D631 Anemia in chronic kidney disease: Secondary | ICD-10-CM | POA: Diagnosis not present

## 2019-08-20 DIAGNOSIS — N182 Chronic kidney disease, stage 2 (mild): Secondary | ICD-10-CM | POA: Diagnosis not present

## 2019-08-20 DIAGNOSIS — Z4789 Encounter for other orthopedic aftercare: Secondary | ICD-10-CM | POA: Diagnosis not present

## 2019-08-20 DIAGNOSIS — E1122 Type 2 diabetes mellitus with diabetic chronic kidney disease: Secondary | ICD-10-CM | POA: Diagnosis not present

## 2019-08-20 DIAGNOSIS — M5416 Radiculopathy, lumbar region: Secondary | ICD-10-CM | POA: Diagnosis not present

## 2019-08-23 ENCOUNTER — Other Ambulatory Visit: Payer: Self-pay

## 2019-08-23 ENCOUNTER — Ambulatory Visit
Admission: RE | Admit: 2019-08-23 | Discharge: 2019-08-23 | Disposition: A | Discharge status: Home / Self Care | Payer: Medicare Other | Source: Ambulatory Visit | Attending: Internal Medicine | Admitting: Internal Medicine

## 2019-08-23 DIAGNOSIS — Z1231 Encounter for screening mammogram for malignant neoplasm of breast: Secondary | ICD-10-CM

## 2019-08-26 DIAGNOSIS — E113311 Type 2 diabetes mellitus with moderate nonproliferative diabetic retinopathy with macular edema, right eye: Secondary | ICD-10-CM | POA: Diagnosis not present

## 2019-08-26 DIAGNOSIS — H59031 Cystoid macular edema following cataract surgery, right eye: Secondary | ICD-10-CM | POA: Diagnosis not present

## 2019-08-26 DIAGNOSIS — H43821 Vitreomacular adhesion, right eye: Secondary | ICD-10-CM | POA: Diagnosis not present

## 2019-08-26 DIAGNOSIS — H4321 Crystalline deposits in vitreous body, right eye: Secondary | ICD-10-CM | POA: Diagnosis not present

## 2019-10-15 DIAGNOSIS — M4316 Spondylolisthesis, lumbar region: Secondary | ICD-10-CM | POA: Diagnosis not present

## 2019-11-03 IMAGING — MG DIGITAL SCREENING BILAT W/ TOMO W/ CAD
8 series · 8 of 24 positions shown · non-contrast
Comparison: Previous exam(s).

CLINICAL DATA: Screening.

EXAM:
DIGITAL SCREENING BILATERAL MAMMOGRAM WITH TOMO AND CAD

[L CC synth-2D]
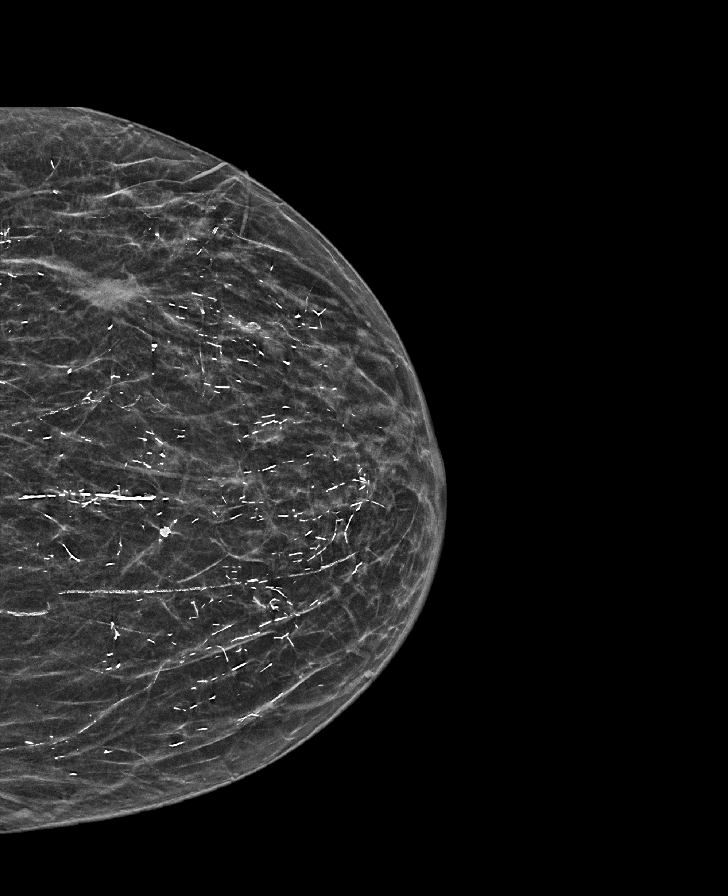

[L MLO synth-2D]
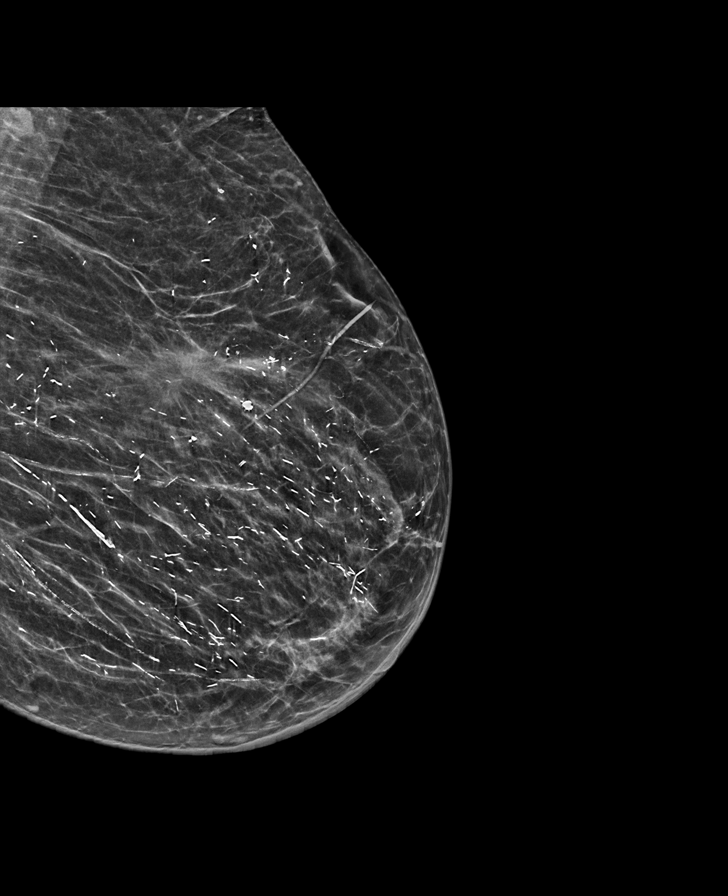

[R CC synth-2D]
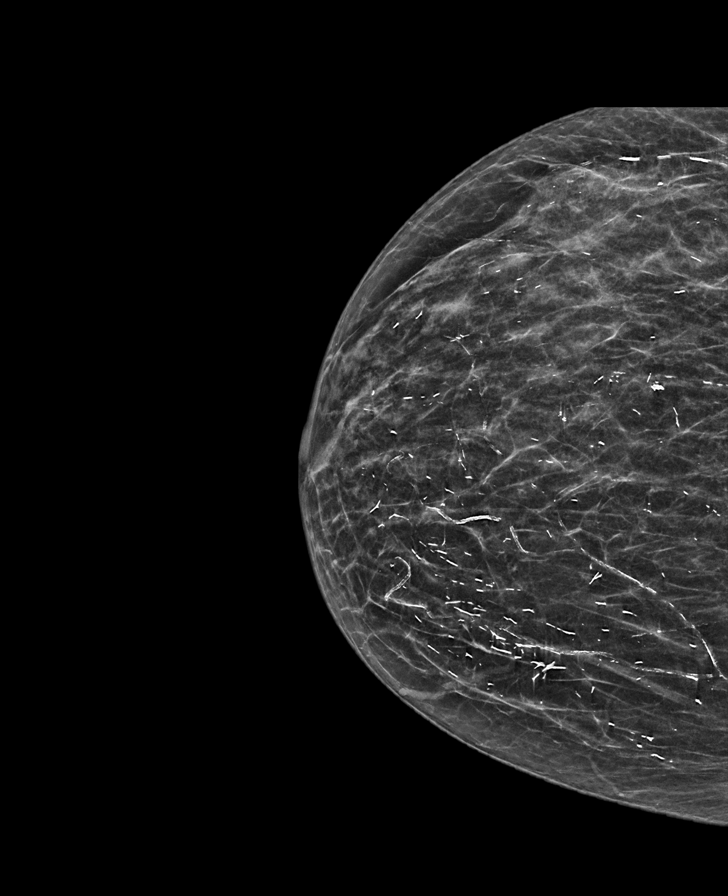

[R MLO synth-2D]
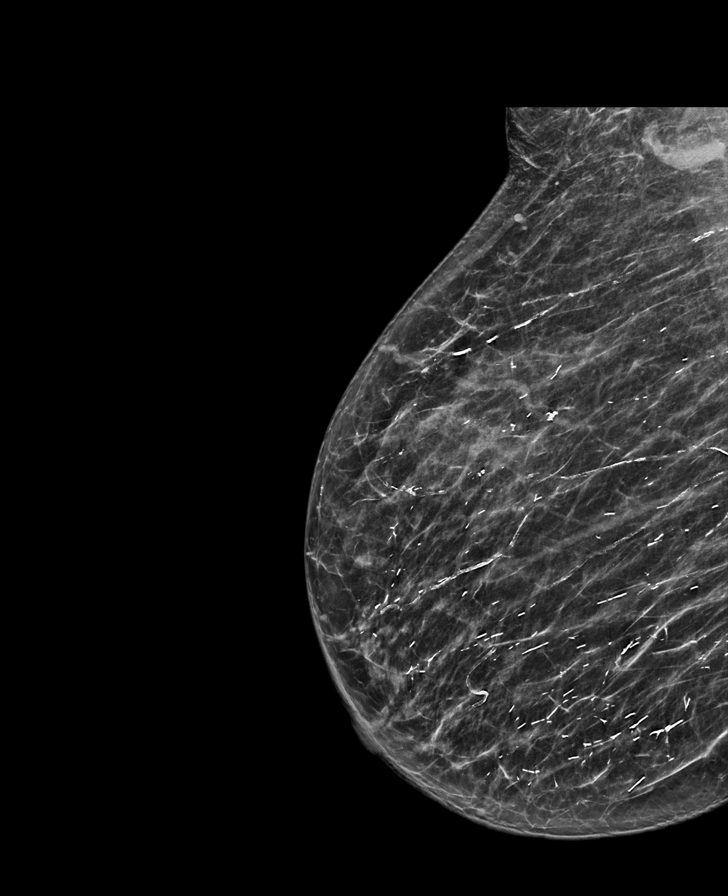

[L CC tomo · tomo slice 25/49.0]
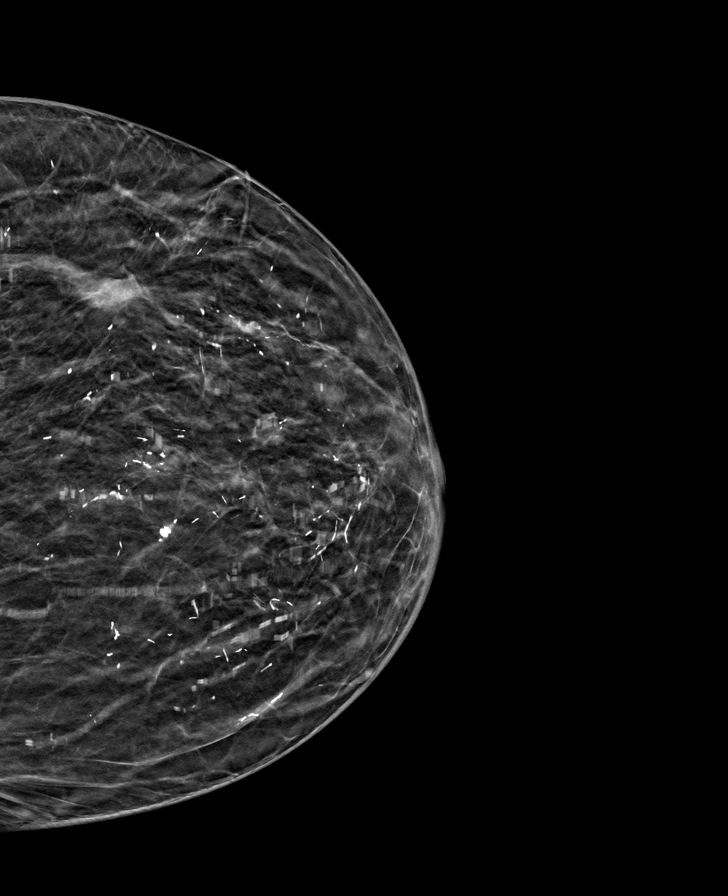

[R CC tomo · tomo slice 25/50.0]
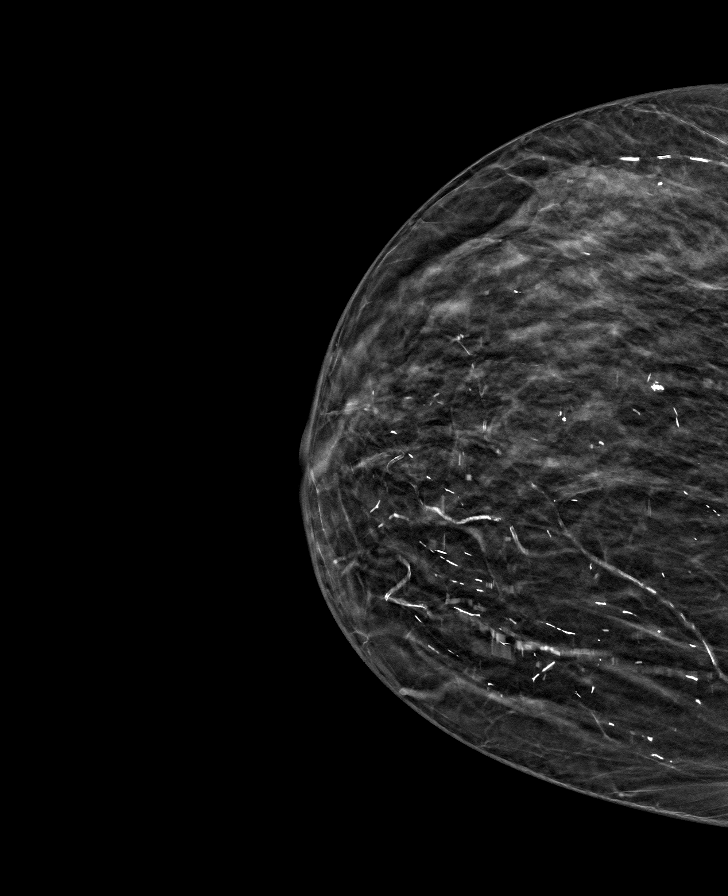

[R MLO tomo · tomo slice 31/60.0]
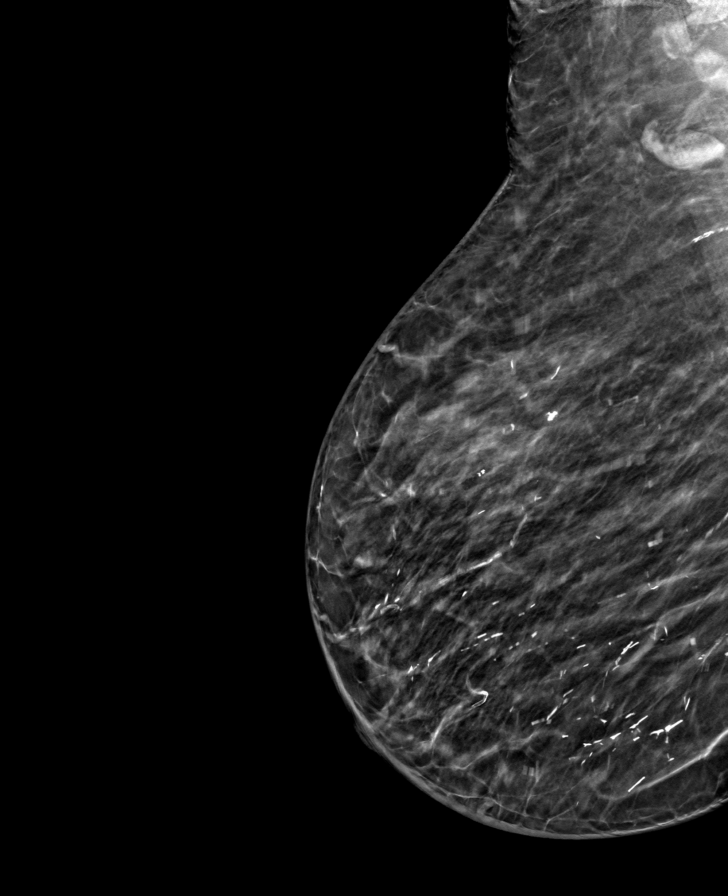

[L MLO tomo · tomo slice 30/59.0]
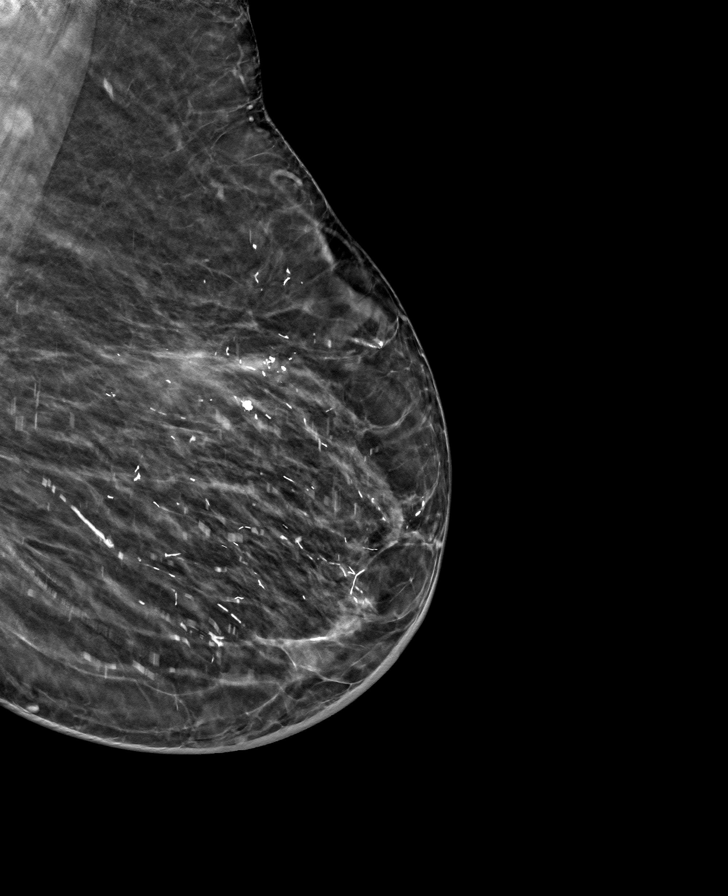

[8 of 24 positions shown; findings below may reference images not displayed]

ACR Breast Density Category b: There are scattered areas of
fibroglandular density.
FINDINGS: There are no findings suspicious for malignancy. Images were
processed with CAD.
IMPRESSION: No mammographic evidence of malignancy. A result letter of this
screening mammogram will be mailed directly to the patient.

RECOMMENDATION:
Screening mammogram in one year. (Code:[id number])

BI-RADS CATEGORY  1: Negative.

## 2019-11-15 DIAGNOSIS — N3281 Overactive bladder: Secondary | ICD-10-CM | POA: Diagnosis not present

## 2019-11-15 DIAGNOSIS — R413 Other amnesia: Secondary | ICD-10-CM | POA: Diagnosis not present

## 2019-11-15 DIAGNOSIS — M8580 Other specified disorders of bone density and structure, unspecified site: Secondary | ICD-10-CM | POA: Diagnosis not present

## 2019-11-15 DIAGNOSIS — J309 Allergic rhinitis, unspecified: Secondary | ICD-10-CM | POA: Diagnosis not present

## 2019-11-15 DIAGNOSIS — E1169 Type 2 diabetes mellitus with other specified complication: Secondary | ICD-10-CM | POA: Diagnosis not present

## 2019-11-15 DIAGNOSIS — D649 Anemia, unspecified: Secondary | ICD-10-CM | POA: Diagnosis not present

## 2019-11-15 DIAGNOSIS — N1832 Chronic kidney disease, stage 3b: Secondary | ICD-10-CM | POA: Diagnosis not present

## 2019-11-15 DIAGNOSIS — M5136 Other intervertebral disc degeneration, lumbar region: Secondary | ICD-10-CM | POA: Diagnosis not present

## 2019-11-15 DIAGNOSIS — K219 Gastro-esophageal reflux disease without esophagitis: Secondary | ICD-10-CM | POA: Diagnosis not present

## 2019-11-15 DIAGNOSIS — R300 Dysuria: Secondary | ICD-10-CM | POA: Diagnosis not present

## 2019-11-15 DIAGNOSIS — E785 Hyperlipidemia, unspecified: Secondary | ICD-10-CM | POA: Diagnosis not present

## 2019-11-18 DIAGNOSIS — N390 Urinary tract infection, site not specified: Secondary | ICD-10-CM | POA: Diagnosis not present

## 2019-11-18 DIAGNOSIS — R300 Dysuria: Secondary | ICD-10-CM | POA: Diagnosis not present

## 2019-11-21 DIAGNOSIS — N183 Chronic kidney disease, stage 3 unspecified: Secondary | ICD-10-CM | POA: Diagnosis not present

## 2019-11-21 DIAGNOSIS — E1169 Type 2 diabetes mellitus with other specified complication: Secondary | ICD-10-CM | POA: Diagnosis not present

## 2019-11-21 DIAGNOSIS — E7849 Other hyperlipidemia: Secondary | ICD-10-CM | POA: Diagnosis not present

## 2019-11-28 ENCOUNTER — Ambulatory Visit: Payer: Medicare Other

## 2019-12-23 DIAGNOSIS — E113392 Type 2 diabetes mellitus with moderate nonproliferative diabetic retinopathy without macular edema, left eye: Secondary | ICD-10-CM | POA: Diagnosis not present

## 2019-12-23 DIAGNOSIS — B353 Tinea pedis: Secondary | ICD-10-CM | POA: Diagnosis not present

## 2019-12-23 DIAGNOSIS — E1351 Other specified diabetes mellitus with diabetic peripheral angiopathy without gangrene: Secondary | ICD-10-CM | POA: Diagnosis not present

## 2019-12-23 DIAGNOSIS — H59031 Cystoid macular edema following cataract surgery, right eye: Secondary | ICD-10-CM | POA: Diagnosis not present

## 2019-12-23 DIAGNOSIS — H4321 Crystalline deposits in vitreous body, right eye: Secondary | ICD-10-CM | POA: Diagnosis not present

## 2019-12-23 DIAGNOSIS — E113311 Type 2 diabetes mellitus with moderate nonproliferative diabetic retinopathy with macular edema, right eye: Secondary | ICD-10-CM | POA: Diagnosis not present

## 2019-12-23 DIAGNOSIS — L602 Onychogryphosis: Secondary | ICD-10-CM | POA: Diagnosis not present

## 2019-12-24 DIAGNOSIS — E7849 Other hyperlipidemia: Secondary | ICD-10-CM | POA: Diagnosis not present

## 2019-12-24 DIAGNOSIS — M859 Disorder of bone density and structure, unspecified: Secondary | ICD-10-CM | POA: Diagnosis not present

## 2019-12-24 DIAGNOSIS — M8589 Other specified disorders of bone density and structure, multiple sites: Secondary | ICD-10-CM | POA: Diagnosis not present

## 2019-12-24 DIAGNOSIS — M8580 Other specified disorders of bone density and structure, unspecified site: Secondary | ICD-10-CM | POA: Diagnosis not present

## 2019-12-24 DIAGNOSIS — R7889 Finding of other specified substances, not normally found in blood: Secondary | ICD-10-CM | POA: Diagnosis not present

## 2019-12-24 DIAGNOSIS — E1169 Type 2 diabetes mellitus with other specified complication: Secondary | ICD-10-CM | POA: Diagnosis not present

## 2019-12-26 DIAGNOSIS — E103211 Type 1 diabetes mellitus with mild nonproliferative diabetic retinopathy with macular edema, right eye: Secondary | ICD-10-CM | POA: Diagnosis not present

## 2019-12-26 DIAGNOSIS — H35373 Puckering of macula, bilateral: Secondary | ICD-10-CM | POA: Diagnosis not present

## 2019-12-26 DIAGNOSIS — H5203 Hypermetropia, bilateral: Secondary | ICD-10-CM | POA: Diagnosis not present

## 2019-12-26 DIAGNOSIS — H25012 Cortical age-related cataract, left eye: Secondary | ICD-10-CM | POA: Diagnosis not present

## 2019-12-30 DIAGNOSIS — R413 Other amnesia: Secondary | ICD-10-CM | POA: Diagnosis not present

## 2019-12-30 DIAGNOSIS — D890 Polyclonal hypergammaglobulinemia: Secondary | ICD-10-CM | POA: Diagnosis not present

## 2019-12-30 DIAGNOSIS — N1832 Chronic kidney disease, stage 3b: Secondary | ICD-10-CM | POA: Diagnosis not present

## 2019-12-30 DIAGNOSIS — E1169 Type 2 diabetes mellitus with other specified complication: Secondary | ICD-10-CM | POA: Diagnosis not present

## 2019-12-30 DIAGNOSIS — K219 Gastro-esophageal reflux disease without esophagitis: Secondary | ICD-10-CM | POA: Diagnosis not present

## 2019-12-30 DIAGNOSIS — Z0000 Encounter for general adult medical examination without abnormal findings: Secondary | ICD-10-CM | POA: Diagnosis not present

## 2019-12-30 DIAGNOSIS — D649 Anemia, unspecified: Secondary | ICD-10-CM | POA: Diagnosis not present

## 2019-12-30 DIAGNOSIS — R748 Abnormal levels of other serum enzymes: Secondary | ICD-10-CM | POA: Diagnosis not present

## 2019-12-30 DIAGNOSIS — R809 Proteinuria, unspecified: Secondary | ICD-10-CM | POA: Diagnosis not present

## 2019-12-30 DIAGNOSIS — M25551 Pain in right hip: Secondary | ICD-10-CM | POA: Diagnosis not present

## 2019-12-30 DIAGNOSIS — Z1331 Encounter for screening for depression: Secondary | ICD-10-CM | POA: Diagnosis not present

## 2019-12-30 DIAGNOSIS — N3281 Overactive bladder: Secondary | ICD-10-CM | POA: Diagnosis not present

## 2020-01-10 DIAGNOSIS — R82998 Other abnormal findings in urine: Secondary | ICD-10-CM | POA: Diagnosis not present

## 2020-02-03 ENCOUNTER — Encounter (INDEPENDENT_AMBULATORY_CARE_PROVIDER_SITE_OTHER): Payer: Medicare Other | Admitting: Ophthalmology

## 2020-02-03 ENCOUNTER — Encounter (INDEPENDENT_AMBULATORY_CARE_PROVIDER_SITE_OTHER): Payer: Self-pay | Admitting: Ophthalmology

## 2020-02-03 ENCOUNTER — Other Ambulatory Visit: Payer: Self-pay

## 2020-02-03 ENCOUNTER — Ambulatory Visit (INDEPENDENT_AMBULATORY_CARE_PROVIDER_SITE_OTHER): Payer: Medicare Other | Admitting: Ophthalmology

## 2020-02-03 DIAGNOSIS — H4321 Crystalline deposits in vitreous body, right eye: Secondary | ICD-10-CM | POA: Diagnosis not present

## 2020-02-03 DIAGNOSIS — H43822 Vitreomacular adhesion, left eye: Secondary | ICD-10-CM | POA: Diagnosis not present

## 2020-02-03 DIAGNOSIS — E113411 Type 2 diabetes mellitus with severe nonproliferative diabetic retinopathy with macular edema, right eye: Secondary | ICD-10-CM | POA: Diagnosis not present

## 2020-02-03 DIAGNOSIS — E113392 Type 2 diabetes mellitus with moderate nonproliferative diabetic retinopathy without macular edema, left eye: Secondary | ICD-10-CM

## 2020-02-03 DIAGNOSIS — H43821 Vitreomacular adhesion, right eye: Secondary | ICD-10-CM | POA: Insufficient documentation

## 2020-02-03 DIAGNOSIS — E113591 Type 2 diabetes mellitus with proliferative diabetic retinopathy without macular edema, right eye: Secondary | ICD-10-CM | POA: Insufficient documentation

## 2020-02-03 DIAGNOSIS — E113551 Type 2 diabetes mellitus with stable proliferative diabetic retinopathy, right eye: Secondary | ICD-10-CM | POA: Insufficient documentation

## 2020-02-03 DIAGNOSIS — E113311 Type 2 diabetes mellitus with moderate nonproliferative diabetic retinopathy with macular edema, right eye: Secondary | ICD-10-CM | POA: Diagnosis not present

## 2020-02-03 HISTORY — DX: Vitreomacular adhesion, right eye: H43.821

## 2020-02-03 MED ORDER — BEVACIZUMAB CHEMO INJECTION 1.25MG/0.05ML SYRINGE FOR KALEIDOSCOPE
1.2500 mg | INTRAVITREAL | Status: AC | PRN
  Administered 2020-02-03: 09:00:00 1.25 mg via INTRAVITREAL

## 2020-02-28 DIAGNOSIS — R300 Dysuria: Secondary | ICD-10-CM | POA: Diagnosis not present

## 2020-02-28 DIAGNOSIS — E1351 Other specified diabetes mellitus with diabetic peripheral angiopathy without gangrene: Secondary | ICD-10-CM | POA: Diagnosis not present

## 2020-02-28 DIAGNOSIS — B353 Tinea pedis: Secondary | ICD-10-CM | POA: Diagnosis not present

## 2020-02-28 DIAGNOSIS — L602 Onychogryphosis: Secondary | ICD-10-CM | POA: Diagnosis not present

## 2020-03-16 ENCOUNTER — Encounter (INDEPENDENT_AMBULATORY_CARE_PROVIDER_SITE_OTHER): Payer: Medicare Other | Admitting: Ophthalmology

## 2020-03-18 ENCOUNTER — Ambulatory Visit (INDEPENDENT_AMBULATORY_CARE_PROVIDER_SITE_OTHER): Payer: Medicare Other | Admitting: Ophthalmology

## 2020-03-18 ENCOUNTER — Encounter (INDEPENDENT_AMBULATORY_CARE_PROVIDER_SITE_OTHER): Payer: Self-pay | Admitting: Ophthalmology

## 2020-03-18 ENCOUNTER — Other Ambulatory Visit: Payer: Self-pay

## 2020-03-18 DIAGNOSIS — E113392 Type 2 diabetes mellitus with moderate nonproliferative diabetic retinopathy without macular edema, left eye: Secondary | ICD-10-CM

## 2020-03-18 DIAGNOSIS — E113311 Type 2 diabetes mellitus with moderate nonproliferative diabetic retinopathy with macular edema, right eye: Secondary | ICD-10-CM

## 2020-03-18 DIAGNOSIS — E113411 Type 2 diabetes mellitus with severe nonproliferative diabetic retinopathy with macular edema, right eye: Secondary | ICD-10-CM | POA: Diagnosis not present

## 2020-03-18 DIAGNOSIS — H4321 Crystalline deposits in vitreous body, right eye: Secondary | ICD-10-CM | POA: Diagnosis not present

## 2020-03-18 MED ORDER — BEVACIZUMAB CHEMO INJECTION 1.25MG/0.05ML SYRINGE FOR KALEIDOSCOPE
1.2500 mg | INTRAVITREAL | Status: AC | PRN
  Administered 2020-03-18: 1.25 mg via INTRAVITREAL

## 2020-03-18 NOTE — Assessment & Plan Note (Signed)
The nature of vitreous floaters was discussed with the patient as well as their frequent occurrence with development of posterior vitreous detachment. The significance of flashes and field cuts was discussed with the patient. The need for a dilated fundus examination was discussed and advice given to return immediately for new or different floaters .  More uncommonly, vitreous floaters, debris, or clouds of haze may develop with impact on visual functioning.  If the personal impact to the patient is minor, observation usually results in diminishing symptoms.  If visual impact hampers activity of daily living over a period of time, medical interventions are available to change the condition, including laser vitreolysis or more commonly, surgical intervention to remove the visual impactful floaters.The nature of vitreous floaters was discussed with the patient as well as their frequent occurrence with development of posterior vitreous detachment. The significance of flashes and field cuts was discussed with the patient. The need for a dilated fundus examination was discussed and advice given to return immediately for new or different floaters .  More uncommonly, vitreous floaters, debris, or clouds of haze may develop with impact on visual functioning.  If the personal impact to the patient is minor, observation usually results in diminishing symptoms.  If visual impact hampers activity of daily living over a period of time, medical interventions are available to change the condition, including laser vitreolysis or more commonly, surgical intervention to remove the visual impactful floaters. 

## 2020-03-18 NOTE — Progress Notes (Signed)
03/18/2020     CHIEF COMPLAINT Patient presents for Diabetic Eye Exam   HISTORY OF PRESENT ILLNESS: [name] Mathews is a 86 y.o. female who presents to the clinic today for:   HPI    Diabetic Eye Exam    Vision is stable.  Associated Symptoms Negative for Flashes and Floaters.  Diabetes characteristics include Type 2.  Blood sugar level is controlled.  Last Blood Glucose 137.  Last A1C 6.4.  I, the attending physician,  performed the HPI with the patient and updated documentation appropriately.          Comments    6 Week Diabetic Exam OD. Possible Avastin OD. OCT  Pt states vision is stable. Denies FOL and floaters.       Last edited by Elyse Jarvis on 03/18/2020  8:20 AM. (History)      Referring physician: Rodrigo Ran, MD [address] [place],  [place] [address]  HISTORICAL INFORMATION:   Selected notes from the MEDICAL RECORD NUMBER    Lab Results  Component Value Date   HGBA1C 6.5 (H) 06/06/2019     CURRENT MEDICATIONS: No current outpatient medications on file. (Ophthalmic Drugs)   No current facility-administered medications for this visit. (Ophthalmic Drugs)   Current Outpatient Medications (Other)  Medication Sig   acetaminophen (TYLENOL) 325 MG tablet Take 2 tablets (650 mg total) by mouth every 4 (four) hours as needed for mild pain ((score 1 to 3) or temp > 100.5).   amLODipine (NORVASC) 10 MG tablet Take 1 tablet by mouth once.   amLODipine (NORVASC) 5 MG tablet Take 1 tablet (5 mg total) by mouth daily.   aspirin 81 MG tablet Take 81 mg by mouth daily.   Bacillus Coagulans-Inulin (ALIGN PREBIOTIC-PROBIOTIC PO) Take by mouth.   cefdinir (OMNICEF) 300 MG capsule Take 300 mg by mouth 2 (two) times daily.   Cholecalciferol (VITAMIN D3) 125 MCG (5000 UT) TABS Take 1 tablet (5,000 Units total) by mouth daily.   cyclobenzaprine (FLEXERIL) 10 MG tablet Take 1 tablet (10 mg total) by mouth 3 (three) times daily as needed for muscle spasms.    docusate sodium (COLACE) 250 MG capsule Take 250 mg by mouth daily.   Ferrous Sulfate (SLOW RELEASE IRON PO) Take 1 tablet by mouth daily.   fluticasone (FLONASE) 50 MCG/ACT nasal spray Place 1 spray into both nostrils daily as needed for allergies or rhinitis.   furosemide (LASIX) 20 MG tablet Take 20 mg by mouth daily.   galantamine (RAZADYNE ER) 16 MG 24 hr capsule Take 16 mg by mouth every evening.   hydrALAZINE (APRESOLINE) 25 MG tablet Take 25 mg by mouth 3 (three) times daily.   Lactobacillus-Inulin (PROBIOTIC DIGESTIVE SUPPORT PO) Take 2 each by mouth every morning.   Lancets (ONETOUCH DELICA PLUS LANCET33G) MISC Apply 1 each topically daily.   linaclotide (LINZESS) 145 MCG CAPS capsule Take 145 mcg by mouth every other day.   Multiple Vitamins-Minerals (MULTIVITAMIN PO) Take 2 each by mouth daily.    MYRBETRIQ 50 MG TB24 tablet Take 50 mg by mouth daily.   nitrofurantoin, macrocrystal-monohydrate, (MACROBID) 100 MG capsule Take 100 mg by mouth 2 (two) times daily. For 14 days   ONETOUCH VERIO test strip USE 1 TEST STRIP TO CHECK BLOOD GLUCOSE ONCE A DAY DX  E11.9   pioglitazone (ACTOS) 30 MG tablet Take 1 tablet (30 mg total) by mouth daily.   rosuvastatin (CRESTOR) 5 MG tablet Take 1 tablet (5 mg total)  by mouth daily.   telmisartan (MICARDIS) 40 MG tablet Take 40 mg by mouth daily.   telmisartan (MICARDIS) 80 MG tablet Take 80 mg by mouth daily.   vitamin B-12 (CYANOCOBALAMIN) 1000 MCG tablet Take 1,000 mcg by mouth daily. OTC   vitamin C (ASCORBIC ACID) 500 MG tablet Take 500 mg by mouth daily.   No current facility-administered medications for this visit. (Other)      REVIEW OF SYSTEMS:    ALLERGIES Allergies  Allergen Reactions   Januvia [Sitagliptin] Other (See Comments)    Numbness in mouth   Simvastatin Other (See Comments)    tremors    PAST MEDICAL HISTORY Past Medical History:  Diagnosis Date   Anemia    hx of   Arthritis     Breast lump    hx of   Cataract    left eye   Chronic back pain    Diabetes mellitus    Hyperlipidemia    Hypertension    sees Dr. Margret Chance, [facility] internal medicine   Normochromic normocytic anemia 05/22/2017   Hb 10 MCV 85.6 No monoclonal spike on SPEP  Creatinine 1.3-1.6   Peripheral vascular disease (HCC)    Past Surgical History:  Procedure Laterality Date   APPENDECTOMY     BACK SURGERY     BREAST CYST EXCISION Left    BREAST SURGERY     cyst removed left side   BUNIONECTOMY     CARDIOVASCULAR STRESS TEST     COLONOSCOPY     DILATION AND CURETTAGE OF UTERUS     TONSILLECTOMY      FAMILY HISTORY History reviewed. No pertinent family history.  SOCIAL HISTORY Social History   Tobacco Use   Smoking status: Never Smoker   Smokeless tobacco: Never Used  Substance Use Topics   Alcohol use: No   Drug use: No         OPHTHALMIC EXAM:  Base Eye Exam    Visual Acuity (Snellen - Linear)      Right Left   Dist cc 20/20 20/20 -1       Tonometry (Tonopen, 8:26 AM)      Right Left   Pressure 10 15       Pupils      Pupils Dark Light Shape React APD   Right PERRL 3 2 Round Slow None   Left PERRL 3 2 Round Slow None       Visual Fields      Left Right    Full Full       Neuro/Psych    Oriented x3: Yes   Mood/Affect: Normal       Dilation    Right eye: 1.0% Mydriacyl, 2.5% Phenylephrine @ 8:26 AM        Slit Lamp and Fundus Exam    Slit Lamp Exam      Right Left   Lids/Lashes Normal Normal   Conjunctiva/Sclera White and quiet White and quiet   Cornea Clear Clear   Anterior Chamber Deep and quiet Deep and quiet   Iris Round and reactive Round and reactive   Anterior Vitreous Normal Normal       Fundus Exam      Right Left   Posterior Vitreous Asteroid hyalosis    Disc Normal    C/D Ratio 0.5    Macula Macular thickening, Microaneurysms, Mild clinically significant macular edema    Vessels NPDR- Moderate     Periphery Normal  IMAGING AND PROCEDURES  Imaging and Procedures for 03/18/20  OCT, Retina - OU - Both Eyes       Right Eye Quality was good. Scan locations included subfoveal. Central Foveal Thickness: 452. Progression has improved. Findings include vitreomacular adhesion .   Left Eye Quality was good. Scan locations included subfoveal. Central Foveal Thickness: 269. Findings include vitreous traction, vitreomacular adhesion .   Notes CSME, persistent OD stable.  Today at 6-week follow-up controlled with intravitreal Avastin OD       Intravitreal Injection, Pharmacologic Agent - OD - Right Eye       Time Out 03/18/2020. 9:13 AM. Confirmed correct patient, procedure, site, and patient consented.   Anesthesia Topical anesthesia was used. Anesthetic medications included Akten 3.5%.   Procedure Preparation included Tobramycin 0.3%, Ofloxacin . A 30 gauge needle was used.   Injection:  1.25 mg Bevacizumab (AVASTIN) SOLN   NDC: [address]-7, Lot: [id number]   Route: Intravitreal, Site: Right Eye, Waste: 0 mg  Post-op Post injection exam found visual acuity of at least counting fingers. The patient tolerated the procedure well. There were no complications. The patient received written and verbal post procedure care education. Post injection medications were not given.                 ASSESSMENT/PLAN:  Asteroid hyalosis of right eye The nature of vitreous floaters was discussed with the patient as well as their frequent occurrence with development of posterior vitreous detachment. The significance of flashes and field cuts was discussed with the patient. The need for a dilated fundus examination was discussed and advice given to return immediately for new or different floaters .  More uncommonly, vitreous floaters, debris, or clouds of haze may develop with impact on visual functioning.  If the personal impact to the patient is minor, observation usually results in  diminishing symptoms.  If visual impact hampers activity of daily living over a period of time, medical interventions are available to change the condition, including laser vitreolysis or more commonly, surgical intervention to remove the visual impactful floaters.The nature of vitreous floaters was discussed with the patient as well as their frequent occurrence with development of posterior vitreous detachment. The significance of flashes and field cuts was discussed with the patient. The need for a dilated fundus examination was discussed and advice given to return immediately for new or different floaters .  More uncommonly, vitreous floaters, debris, or clouds of haze may develop with impact on visual functioning.  If the personal impact to the patient is minor, observation usually results in diminishing symptoms.  If visual impact hampers activity of daily living over a period of time, medical interventions are available to change the condition, including laser vitreolysis or more commonly, surgical intervention to remove the visual impactful floaters.  Moderate nonproliferative diabetic retinopathy of right eye with macular edema (HCC)  The nature of diabetic macular edema was discussed with the patient. Treatment options were outlined including medical therapy, laser & vitrectomy. The use of injectable medications reviewed, including Avastin, Lucentis, and Eylea. Periodic injections into the eye are likely to resolve diabetic macular edema (swelling in the center of vision). Initially, injections are delivered are delivered every 4-6 weeks, and the interval extended as the condition improves. On average, 8-9 injections the first year, and 5 in year 2. Improvement in the condition most often improves on medical therapy. Occasional use of focal laser is also recommended for residual macular edema (swelling). Excellent control of blood glucose and  blood pressure are encouraged under the care of a primary  physician or endocrinologist. Similarly, attempts to maintain serum cholesterol, low density lipoproteins, and high-density lipoproteins in a favorable range were recommended.   OD, stable overall yet still active repeat intravitreal Avastin today.      ICD-10-CM   1. Severe nonproliferative diabetic retinopathy of right eye, with macular edema, associated with type 2 diabetes mellitus (HCC)  E11.3411 OCT, Retina - OU - Both Eyes    Intravitreal Injection, Pharmacologic Agent - OD - Right Eye    Bevacizumab (AVASTIN) SOLN 1.25 mg  2. Moderate nonproliferative diabetic retinopathy of left eye without macular edema associated with type 2 diabetes mellitus (HCC)  Z16.9678 OCT, Retina - OU - Both Eyes  3. Asteroid hyalosis of right eye  H43.21   4. Moderate nonproliferative diabetic retinopathy of right eye with macular edema associated with type 2 diabetes mellitus ([place])  E11.3311     1.  Repeat intravitreal Avastin OD today  2.  3.  Ophthalmic Meds Ordered this visit:  Meds ordered this encounter  Medications   Bevacizumab (AVASTIN) SOLN 1.25 mg       Return in about 6 weeks (around 04/29/2020) for AVASTIN OCT.  There are no Patient Instructions on file for this visit.   Explained the diagnoses, plan, and follow up with the patient and they expressed understanding.  Patient expressed understanding of the importance of proper follow up care.   Clent Demark Katelyn Broadnax M.D. Diseases & Surgery of the Retina and Vitreous [facility] [place] 03/18/20     Abbreviations: M myopia (nearsighted); A astigmatism; H hyperopia (farsighted); P presbyopia; Mrx spectacle prescription;  CTL contact lenses; OD right eye; OS left eye; OU both eyes  XT exotropia; ET esotropia; PEK punctate epithelial keratitis; PEE punctate epithelial erosions; DES dry eye syndrome; MGD meibomian gland dysfunction; ATs artificial tears; PFAT's preservative free artificial tears; Rogers nuclear sclerotic cataract; PSC  posterior subcapsular cataract; ERM epi-retinal membrane; PVD posterior vitreous detachment; RD retinal detachment; DM diabetes mellitus; DR diabetic retinopathy; NPDR non-proliferative diabetic retinopathy; PDR proliferative diabetic retinopathy; CSME clinically significant macular edema; DME diabetic macular edema; dbh dot blot hemorrhages; CWS cotton wool spot; POAG primary open angle glaucoma; C/D cup-to-disc ratio; HVF humphrey visual field; GVF goldmann visual field; OCT optical coherence tomography; IOP intraocular pressure; BRVO Branch retinal vein occlusion; CRVO central retinal vein occlusion; CRAO central retinal artery occlusion; BRAO branch retinal artery occlusion; RT retinal tear; SB scleral buckle; PPV pars plana vitrectomy; VH Vitreous hemorrhage; PRP panretinal laser photocoagulation; IVK intravitreal kenalog; VMT vitreomacular traction; MH Macular hole;  NVD neovascularization of the disc; NVE neovascularization elsewhere; AREDS age related eye disease study; ARMD age related macular degeneration; POAG primary open angle glaucoma; EBMD epithelial/anterior basement membrane dystrophy; ACIOL anterior chamber intraocular lens; IOL intraocular lens; PCIOL posterior chamber intraocular lens; Phaco/IOL phacoemulsification with intraocular lens placement; [place] photorefractive keratectomy; LASIK laser assisted in situ keratomileusis; HTN hypertension; DM diabetes mellitus; COPD chronic obstructive pulmonary disease

## 2020-03-18 NOTE — Assessment & Plan Note (Signed)
The nature of diabetic macular edema was discussed with the patient. Treatment options were outlined including medical therapy, laser & vitrectomy. The use of injectable medications reviewed, including Avastin, Lucentis, and Eylea. Periodic injections into the eye are likely to resolve diabetic macular edema (swelling in the center of vision). Initially, injections are delivered are delivered every 4-6 weeks, and the interval extended as the condition improves. On average, 8-9 injections the first year, and 5 in year 2. Improvement in the condition most often improves on medical therapy. Occasional use of focal laser is also recommended for residual macular edema (swelling). Excellent control of blood glucose and blood pressure are encouraged under the care of a primary physician or endocrinologist. Similarly, attempts to maintain serum cholesterol, low density lipoproteins, and high-density lipoproteins in a favorable range were recommended.   OD, stable overall yet still active repeat intravitreal Avastin today.

## 2020-04-29 ENCOUNTER — Encounter (INDEPENDENT_AMBULATORY_CARE_PROVIDER_SITE_OTHER): Payer: Medicare Other | Admitting: Ophthalmology

## 2020-04-30 ENCOUNTER — Ambulatory Visit (INDEPENDENT_AMBULATORY_CARE_PROVIDER_SITE_OTHER): Payer: Medicare Other | Admitting: Ophthalmology

## 2020-04-30 ENCOUNTER — Encounter (INDEPENDENT_AMBULATORY_CARE_PROVIDER_SITE_OTHER): Payer: Self-pay | Admitting: Ophthalmology

## 2020-04-30 ENCOUNTER — Other Ambulatory Visit: Payer: Self-pay

## 2020-04-30 DIAGNOSIS — E113411 Type 2 diabetes mellitus with severe nonproliferative diabetic retinopathy with macular edema, right eye: Secondary | ICD-10-CM

## 2020-04-30 DIAGNOSIS — E113311 Type 2 diabetes mellitus with moderate nonproliferative diabetic retinopathy with macular edema, right eye: Secondary | ICD-10-CM

## 2020-04-30 MED ORDER — BEVACIZUMAB CHEMO INJECTION 1.25MG/0.05ML SYRINGE FOR KALEIDOSCOPE
1.2500 mg | INTRAVITREAL | Status: AC | PRN
  Administered 2020-04-30: 1.25 mg via INTRAVITREAL

## 2020-04-30 NOTE — Assessment & Plan Note (Signed)
The nature of diabetic macular edema was discussed with the patient. Treatment options were outlined including medical therapy, laser & vitrectomy. The use of injectable medications reviewed, including Avastin, Lucentis, and Eylea. Periodic injections into the eye are likely to resolve diabetic macular edema (swelling in the center of vision). Initially, injections are delivered are delivered every 4-6 weeks, and the interval extended as the condition improves. On average, 8-9 injections the first year, and 5 in year 2. Improvement in the condition most often improves on medical therapy. Occasional use of focal laser is also recommended for residual macular edema (swelling). Excellent control of blood glucose and blood pressure are encouraged under the care of a primary physician or endocrinologist. Similarly, attempts to maintain serum cholesterol, low density lipoproteins, and high-density lipoproteins in a favorable range were recommended.   Intravitreal Avastin OD today

## 2020-04-30 NOTE — Progress Notes (Signed)
04/30/2020     CHIEF COMPLAINT Patient presents for Retina Follow Up   HISTORY OF PRESENT ILLNESS: [name] Mathews is a 86 y.o. female who presents to the clinic today for:   HPI    Retina Follow Up    Patient presents with  Diabetic Retinopathy.  In right eye.  Severity is moderate.  Duration of 6 weeks.  Since onset it is stable.  I, the attending physician,  performed the HPI with the patient and updated documentation appropriately.          Comments    6 Week NPDR f\u OD. Possible Avastin OD. OCT  Pt states vision is stable. Denies any complaints. BGL: 130 A1C: 6.1       Last edited by Elyse Jarvis on 04/30/2020  8:20 AM. (History)      Referring physician: Rodrigo Ran, MD [address] [place],  [place] [address]  HISTORICAL INFORMATION:   Selected notes from the MEDICAL RECORD NUMBER    Lab Results  Component Value Date   HGBA1C 6.5 (H) 06/06/2019     CURRENT MEDICATIONS: No current outpatient medications on file. (Ophthalmic Drugs)   No current facility-administered medications for this visit. (Ophthalmic Drugs)   Current Outpatient Medications (Other)  Medication Sig  . acetaminophen (TYLENOL) 325 MG tablet Take 2 tablets (650 mg total) by mouth every 4 (four) hours as needed for mild pain ((score 1 to 3) or temp > 100.5).  Marland Kitchen amLODipine (NORVASC) 10 MG tablet Take 1 tablet by mouth once.  Marland Kitchen amLODipine (NORVASC) 5 MG tablet Take 1 tablet (5 mg total) by mouth daily.  Marland Kitchen aspirin 81 MG tablet Take 81 mg by mouth daily.  . Bacillus Coagulans-Inulin (ALIGN PREBIOTIC-PROBIOTIC PO) Take by mouth.  . cefdinir (OMNICEF) 300 MG capsule Take 300 mg by mouth 2 (two) times daily.  . Cholecalciferol (VITAMIN D3) 125 MCG (5000 UT) TABS Take 1 tablet (5,000 Units total) by mouth daily.  . cyclobenzaprine (FLEXERIL) 10 MG tablet Take 1 tablet (10 mg total) by mouth 3 (three) times daily as needed for muscle spasms.  Marland Kitchen docusate sodium (COLACE) 250 MG capsule Take 250  mg by mouth daily.  . Ferrous Sulfate (SLOW RELEASE IRON PO) Take 1 tablet by mouth daily.  . fluticasone (FLONASE) 50 MCG/ACT nasal spray Place 1 spray into both nostrils daily as needed for allergies or rhinitis.  . furosemide (LASIX) 20 MG tablet Take 20 mg by mouth daily.  Marland Kitchen galantamine (RAZADYNE ER) 16 MG 24 hr capsule Take 16 mg by mouth every evening.  . hydrALAZINE (APRESOLINE) 25 MG tablet Take 25 mg by mouth 3 (three) times daily.  . Lactobacillus-Inulin (PROBIOTIC DIGESTIVE SUPPORT PO) Take 2 each by mouth every morning.  . Lancets (ONETOUCH DELICA PLUS LANCET33G) MISC Apply 1 each topically daily.  Marland Kitchen linaclotide (LINZESS) 145 MCG CAPS capsule Take 145 mcg by mouth every other day.  . Multiple Vitamins-Minerals (MULTIVITAMIN PO) Take 2 each by mouth daily.   Marland Kitchen MYRBETRIQ 50 MG TB24 tablet Take 50 mg by mouth daily.  . nitrofurantoin, macrocrystal-monohydrate, (MACROBID) 100 MG capsule Take 100 mg by mouth 2 (two) times daily. For 14 days  . ONETOUCH VERIO test strip USE 1 TEST STRIP TO CHECK BLOOD GLUCOSE ONCE A DAY DX  E11.9  . pioglitazone (ACTOS) 30 MG tablet Take 1 tablet (30 mg total) by mouth daily.  . rosuvastatin (CRESTOR) 5 MG tablet Take 1 tablet (5 mg total) by mouth daily.  Marland Kitchen  telmisartan (MICARDIS) 40 MG tablet Take 40 mg by mouth daily.  Marland Kitchen telmisartan (MICARDIS) 80 MG tablet Take 80 mg by mouth daily.  . vitamin B-12 (CYANOCOBALAMIN) 1000 MCG tablet Take 1,000 mcg by mouth daily. OTC  . vitamin C (ASCORBIC ACID) 500 MG tablet Take 500 mg by mouth daily.   No current facility-administered medications for this visit. (Other)      REVIEW OF SYSTEMS: ROS    Positive for: Endocrine   Last edited by Elyse Jarvis on 04/30/2020  8:20 AM. (History)       ALLERGIES Allergies  Allergen Reactions  . Januvia [Sitagliptin] Other (See Comments)    Numbness in mouth  . Simvastatin Other (See Comments)    tremors    PAST MEDICAL HISTORY Past Medical History:    Diagnosis Date  . Anemia    hx of  . Arthritis   . Breast lump    hx of  . Cataract    left eye  . Chronic back pain   . Diabetes mellitus   . Hyperlipidemia   . Hypertension    sees Dr. Margret Chance, [facility] internal medicine  . Normochromic normocytic anemia 05/22/2017   Hb 10 MCV 85.6 No monoclonal spike on SPEP  Creatinine 1.3-1.6  . Peripheral vascular disease [facility])    Past Surgical History:  Procedure Laterality Date  . APPENDECTOMY    . BACK SURGERY    . BREAST CYST EXCISION Left   . BREAST SURGERY     cyst removed left side  . BUNIONECTOMY    . CARDIOVASCULAR STRESS TEST    . COLONOSCOPY    . DILATION AND CURETTAGE OF UTERUS    . TONSILLECTOMY      FAMILY HISTORY History reviewed. No pertinent family history.  SOCIAL HISTORY Social History   Tobacco Use  . Smoking status: Never Smoker  . Smokeless tobacco: Never Used  Vaping Use  . Vaping Use: Never used  Substance Use Topics  . Alcohol use: No  . Drug use: No         OPHTHALMIC EXAM:  Base Eye Exam    Visual Acuity (Snellen - Linear)      Right Left   Dist cc 20/25 + 20/25 +       Tonometry (Tonopen, 8:25 AM)      Right Left   Pressure 11 14       Pupils      Pupils Dark Light Shape React APD   Right PERRL 3 2 Round Slow None   Left PERRL 3 2 Round Slow None       Visual Fields (Counting fingers)      Left Right    Full Full       Neuro/Psych    Oriented x3: Yes   Mood/Affect: Normal       Dilation    Right eye: 1.0% Mydriacyl, 2.5% Phenylephrine @ 8:25 AM        Slit Lamp and Fundus Exam    Slit Lamp Exam      Right Left   Lids/Lashes Normal Normal   Conjunctiva/Sclera White and quiet White and quiet   Cornea Clear Clear   Anterior Chamber Deep and quiet Deep and quiet   Iris Round and reactive Round and reactive   Anterior Vitreous Normal Normal       Fundus Exam      Right Left   Posterior Vitreous Asteroid hyalosis    Disc Normal  C/D Ratio 0.5     Macula Macular thickening, Microaneurysms, Mild clinically significant macular edema    Vessels NPDR- Moderate    Periphery Normal           IMAGING AND PROCEDURES  Imaging and Procedures for 04/30/20  OCT, Retina - OU - Both Eyes       Right Eye Quality was good. Scan locations included subfoveal. Central Foveal Thickness: 394.   Left Eye Quality was good. Scan locations included subfoveal. Central Foveal Thickness: 263.   Notes CSME essentially OD, much improved now 6  week interval of examination post intravitreal Avastin OD.  We will repeat injection OD today to maintain and improve visual function       Intravitreal Injection, Pharmacologic Agent - OD - Right Eye       Time Out 04/30/2020. 9:35 AM. Confirmed correct patient, procedure, site, and patient consented.   Anesthesia Topical anesthesia was used. Anesthetic medications included Akten 3.5%.   Procedure Preparation included 10% betadine to eyelids. A 30 gauge needle was used.   Injection:  1.25 mg Bevacizumab (AVASTIN) SOLN   NDC: [address]-469194-0334-1, Lot: [id number]   Route: Intravitreal, Site: Right Eye, Waste: 0 mg  Post-op Post injection exam found visual acuity of at least counting fingers. The patient tolerated the procedure well. There were no complications. The patient received written and verbal post procedure care education. Post injection medications were not given.                 ASSESSMENT/PLAN:  Moderate nonproliferative diabetic retinopathy of right eye with macular edema (HCC)  The nature of diabetic macular edema was discussed with the patient. Treatment options were outlined including medical therapy, laser & vitrectomy. The use of injectable medications reviewed, including Avastin, Lucentis, and Eylea. Periodic injections into the eye are likely to resolve diabetic macular edema (swelling in the center of vision). Initially, injections are delivered are delivered every 4-6 weeks, and the  interval extended as the condition improves. On average, 8-9 injections the first year, and 5 in year 2. Improvement in the condition most often improves on medical therapy. Occasional use of focal laser is also recommended for residual macular edema (swelling). Excellent control of blood glucose and blood pressure are encouraged under the care of a primary physician or endocrinologist. Similarly, attempts to maintain serum cholesterol, low density lipoproteins, and high-density lipoproteins in a favorable range were recommended.   Intravitreal Avastin OD today      ICD-10-CM   1. Severe nonproliferative diabetic retinopathy of right eye, with macular edema, associated with type 2 diabetes mellitus (HCC)  E11.3411 OCT, Retina - OU - Both Eyes    Intravitreal Injection, Pharmacologic Agent - OD - Right Eye    Bevacizumab (AVASTIN) SOLN 1.25 mg  2. Moderate nonproliferative diabetic retinopathy of right eye with macular edema associated with type 2 diabetes mellitus (HCC)  E11.3311     1.  Intravitreal Avastin OD today, examination in 6 weeks  2.  3.  Ophthalmic Meds Ordered this visit:  Meds ordered this encounter  Medications  . Bevacizumab (AVASTIN) SOLN 1.25 mg       Return in about 6 weeks (around 06/11/2020) for dilate, OD, AVASTIN OCT.  There are no Patient Instructions on file for this visit.   Explained the diagnoses, plan, and follow up with the patient and they expressed understanding.  Patient expressed understanding of the importance of proper follow up care.   Alford HighlandGary A. Rishav Rockefeller M.D. Diseases &  Surgery of the Retina and Vitreous [facility] 04/30/20     Abbreviations: M myopia (nearsighted); A astigmatism; H hyperopia (farsighted); P presbyopia; Mrx spectacle prescription;  CTL contact lenses; OD right eye; OS left eye; OU both eyes  XT exotropia; ET esotropia; PEK punctate epithelial keratitis; PEE punctate epithelial erosions; DES dry eye syndrome;  MGD meibomian gland dysfunction; ATs artificial tears; PFAT's preservative free artificial tears; NSC nuclear sclerotic cataract; PSC posterior subcapsular cataract; ERM epi-retinal membrane; PVD posterior vitreous detachment; RD retinal detachment; DM diabetes mellitus; DR diabetic retinopathy; NPDR non-proliferative diabetic retinopathy; PDR proliferative diabetic retinopathy; CSME clinically significant macular edema; DME diabetic macular edema; dbh dot blot hemorrhages; CWS cotton wool spot; POAG primary open angle glaucoma; C/D cup-to-disc ratio; HVF humphrey visual field; GVF goldmann visual field; OCT optical coherence tomography; IOP intraocular pressure; BRVO Branch retinal vein occlusion; CRVO central retinal vein occlusion; CRAO central retinal artery occlusion; BRAO branch retinal artery occlusion; RT retinal tear; SB scleral buckle; PPV pars plana vitrectomy; VH Vitreous hemorrhage; PRP panretinal laser photocoagulation; IVK intravitreal kenalog; VMT vitreomacular traction; MH Macular hole;  NVD neovascularization of the disc; NVE neovascularization elsewhere; AREDS age related eye disease study; ARMD age related macular degeneration; POAG primary open angle glaucoma; EBMD epithelial/anterior basement membrane dystrophy; ACIOL anterior chamber intraocular lens; IOL intraocular lens; PCIOL posterior chamber intraocular lens; Phaco/IOL phacoemulsification with intraocular lens placement; PRK photorefractive keratectomy; LASIK laser assisted in situ keratomileusis; HTN hypertension; DM diabetes mellitus; COPD chronic obstructive pulmonary disease

## 2020-06-10 ENCOUNTER — Encounter (INDEPENDENT_AMBULATORY_CARE_PROVIDER_SITE_OTHER): Payer: Medicare Other | Admitting: Ophthalmology

## 2020-06-10 DIAGNOSIS — D649 Anemia, unspecified: Secondary | ICD-10-CM | POA: Diagnosis not present

## 2020-06-10 DIAGNOSIS — E1169 Type 2 diabetes mellitus with other specified complication: Secondary | ICD-10-CM | POA: Diagnosis not present

## 2020-06-10 DIAGNOSIS — I10 Essential (primary) hypertension: Secondary | ICD-10-CM | POA: Diagnosis not present

## 2020-06-10 DIAGNOSIS — K219 Gastro-esophageal reflux disease without esophagitis: Secondary | ICD-10-CM | POA: Diagnosis not present

## 2020-06-10 DIAGNOSIS — R413 Other amnesia: Secondary | ICD-10-CM | POA: Diagnosis not present

## 2020-06-11 ENCOUNTER — Ambulatory Visit (INDEPENDENT_AMBULATORY_CARE_PROVIDER_SITE_OTHER): Payer: Medicare Other | Admitting: Ophthalmology

## 2020-06-11 ENCOUNTER — Encounter (INDEPENDENT_AMBULATORY_CARE_PROVIDER_SITE_OTHER): Payer: Self-pay | Admitting: Ophthalmology

## 2020-06-11 DIAGNOSIS — E113311 Type 2 diabetes mellitus with moderate nonproliferative diabetic retinopathy with macular edema, right eye: Secondary | ICD-10-CM

## 2020-06-11 DIAGNOSIS — E113411 Type 2 diabetes mellitus with severe nonproliferative diabetic retinopathy with macular edema, right eye: Secondary | ICD-10-CM | POA: Diagnosis not present

## 2020-06-11 MED ORDER — BEVACIZUMAB CHEMO INJECTION 1.25MG/0.05ML SYRINGE FOR KALEIDOSCOPE
1.2500 mg | INTRAVITREAL | Status: AC | PRN
  Administered 2020-06-11: 1.25 mg via INTRAVITREAL

## 2020-06-11 NOTE — Patient Instructions (Signed)
Patient notify us if new symptoms or difficulties arise

## 2020-06-11 NOTE — Assessment & Plan Note (Signed)
Much improved OD yet still active CSME, will repeat intravitreal Avastin OD to control and maintain vision at 6-week interval exam

## 2020-06-11 NOTE — Progress Notes (Signed)
06/11/2020     CHIEF COMPLAINT Patient presents for Retina Follow Up   HISTORY OF PRESENT ILLNESS: [name] Mathews is a 86 y.o. female who presents to the clinic today for:   HPI    Retina Follow Up    Patient presents with  Diabetic Retinopathy.  In right eye.  Severity is moderate.  Duration of 6 weeks.  Since onset it is stable.  I, the attending physician,  performed the HPI with the patient and updated documentation appropriately.          Comments    6 Week NPDR f\u OD. Possible Avastin OD. OCT  Pt states no changes in vision. Denies any complaints. BGL: 128 yesterday        Last edited by Elyse Jarvislayton, Kriston M on 06/11/2020  8:49 AM. (History)      Referring physician: Rodrigo RanPerini, Mark, MD [address] [place],  [place] [id number]  HISTORICAL INFORMATION:   Selected notes from the MEDICAL RECORD NUMBER    Lab Results  Component Value Date   HGBA1C 6.5 (H) 06/06/2019     CURRENT MEDICATIONS: No current outpatient medications on file. (Ophthalmic Drugs)   No current facility-administered medications for this visit. (Ophthalmic Drugs)   Current Outpatient Medications (Other)  Medication Sig  . acetaminophen (TYLENOL) 325 MG tablet Take 2 tablets (650 mg total) by mouth every 4 (four) hours as needed for mild pain ((score 1 to 3) or temp > 100.5).  Marland Kitchen. amLODipine (NORVASC) 10 MG tablet Take 1 tablet by mouth once.  Marland Kitchen. amLODipine (NORVASC) 5 MG tablet Take 1 tablet (5 mg total) by mouth daily.  Marland Kitchen. aspirin 81 MG tablet Take 81 mg by mouth daily.  . Bacillus Coagulans-Inulin (ALIGN PREBIOTIC-PROBIOTIC PO) Take by mouth.  . cefdinir (OMNICEF) 300 MG capsule Take 300 mg by mouth 2 (two) times daily.  . Cholecalciferol (VITAMIN D3) 125 MCG (5000 UT) TABS Take 1 tablet (5,000 Units total) by mouth daily.  . cyclobenzaprine (FLEXERIL) 10 MG tablet Take 1 tablet (10 mg total) by mouth 3 (three) times daily as needed for muscle spasms.  Marland Kitchen. docusate sodium (COLACE) 250 MG capsule  Take 250 mg by mouth daily.  . Ferrous Sulfate (SLOW RELEASE IRON PO) Take 1 tablet by mouth daily.  . fluticasone (FLONASE) 50 MCG/ACT nasal spray Place 1 spray into both nostrils daily as needed for allergies or rhinitis.  . furosemide (LASIX) 20 MG tablet Take 20 mg by mouth daily.  Marland Kitchen. galantamine (RAZADYNE ER) 16 MG 24 hr capsule Take 16 mg by mouth every evening.  . hydrALAZINE (APRESOLINE) 25 MG tablet Take 25 mg by mouth 3 (three) times daily.  . Lactobacillus-Inulin (PROBIOTIC DIGESTIVE SUPPORT PO) Take 2 each by mouth every morning.  . Lancets (ONETOUCH DELICA PLUS LANCET33G) MISC Apply 1 each topically daily.  Marland Kitchen. linaclotide (LINZESS) 145 MCG CAPS capsule Take 145 mcg by mouth every other day.  . Multiple Vitamins-Minerals (MULTIVITAMIN PO) Take 2 each by mouth daily.   Marland Kitchen. MYRBETRIQ 50 MG TB24 tablet Take 50 mg by mouth daily.  . nitrofurantoin, macrocrystal-monohydrate, (MACROBID) 100 MG capsule Take 100 mg by mouth 2 (two) times daily. For 14 days  . ONETOUCH VERIO test strip USE 1 TEST STRIP TO CHECK BLOOD GLUCOSE ONCE A DAY DX  E11.9  . pioglitazone (ACTOS) 30 MG tablet Take 1 tablet (30 mg total) by mouth daily.  . rosuvastatin (CRESTOR) 5 MG tablet Take 1 tablet (5 mg total) by mouth daily.  .Marland Kitchen  telmisartan (MICARDIS) 40 MG tablet Take 40 mg by mouth daily.  Marland Kitchen telmisartan (MICARDIS) 80 MG tablet Take 80 mg by mouth daily.  . vitamin B-12 (CYANOCOBALAMIN) 1000 MCG tablet Take 1,000 mcg by mouth daily. OTC  . vitamin C (ASCORBIC ACID) 500 MG tablet Take 500 mg by mouth daily.   No current facility-administered medications for this visit. (Other)      REVIEW OF SYSTEMS:    ALLERGIES Allergies  Allergen Reactions  . Januvia [Sitagliptin] Other (See Comments)    Numbness in mouth  . Simvastatin Other (See Comments)    tremors    PAST MEDICAL HISTORY Past Medical History:  Diagnosis Date  . Anemia    hx of  . Arthritis   . Breast lump    hx of  . Cataract    left  eye  . Chronic back pain   . Diabetes mellitus   . Hyperlipidemia   . Hypertension    sees Dr. Margret Chance, [facility] internal medicine  . Normochromic normocytic anemia 05/22/2017   Hb 10 MCV 85.6 No monoclonal spike on SPEP  Creatinine 1.3-1.6  . Peripheral vascular disease [facility])    Past Surgical History:  Procedure Laterality Date  . APPENDECTOMY    . BACK SURGERY    . BREAST CYST EXCISION Left   . BREAST SURGERY     cyst removed left side  . BUNIONECTOMY    . CARDIOVASCULAR STRESS TEST    . COLONOSCOPY    . DILATION AND CURETTAGE OF UTERUS    . TONSILLECTOMY      FAMILY HISTORY History reviewed. No pertinent family history.  SOCIAL HISTORY Social History   Tobacco Use  . Smoking status: Never Smoker  . Smokeless tobacco: Never Used  Vaping Use  . Vaping Use: Never used  Substance Use Topics  . Alcohol use: No  . Drug use: No         OPHTHALMIC EXAM:  Base Eye Exam    Visual Acuity (Snellen - Linear)      Right Left   Dist cc 20/25 + 20/20 -2   Correction: Glasses       Tonometry (Tonopen, 8:54 AM)      Right Left   Pressure 9 14       Pupils      Pupils Dark Light Shape React APD   Right PERRL 3 2 Round Slow None   Left PERRL 3 2 Round Slow None       Visual Fields (Counting fingers)      Left Right    Full Full       Neuro/Psych    Oriented x3: Yes   Mood/Affect: Normal       Dilation    Right eye: 1.0% Mydriacyl, 2.5% Phenylephrine @ 8:54 AM        Slit Lamp and Fundus Exam    External Exam      Right Left   External Normal Normal       Slit Lamp Exam      Right Left   Lids/Lashes Normal Normal   Conjunctiva/Sclera White and quiet White and quiet   Cornea Clear Clear   Anterior Chamber Deep and quiet Deep and quiet   Iris Round and reactive Round and reactive   Anterior Vitreous Normal Normal       Fundus Exam      Right Left   Posterior Vitreous Asteroid hyalosis    Disc Normal  C/D Ratio 0.5    Macula  Macular thickening, Microaneurysms, Mild clinically significant macular edema    Vessels NPDR- Moderate    Periphery Normal           IMAGING AND PROCEDURES  Imaging and Procedures for 06/11/20  OCT, Retina - OU - Both Eyes       Right Eye Quality was good. Scan locations included subfoveal. Central Foveal Thickness: 383. Progression has improved. Findings include vitreomacular adhesion , cystoid macular edema.   Left Eye Quality was good. Scan locations included subfoveal. Central Foveal Thickness: 267. Progression has improved. Findings include normal observations.        Intravitreal Injection, Pharmacologic Agent - OD - Right Eye       Time Out 06/11/2020. 10:12 AM. Confirmed correct patient, procedure, site, and patient consented.   Anesthesia Topical anesthesia was used. Anesthetic medications included Akten 3.5%.   Procedure Preparation included Ofloxacin , Tobramycin 0.3%, 5% betadine to ocular surface, 10% betadine to eyelids. A 30 gauge needle was used.   Injection:  1.25 mg Bevacizumab (AVASTIN) SOLN   NDC: [address]-3, Lot: [id number]   Route: Intravitreal, Site: Right Eye, Waste: 0 mg  Post-op Post injection exam found visual acuity of at least counting fingers. The patient tolerated the procedure well. There were no complications. The patient received written and verbal post procedure care education. Post injection medications were not given.                 ASSESSMENT/PLAN:  Moderate nonproliferative diabetic retinopathy of right eye with macular edema (HCC) Much improved OD yet still active CSME, will repeat intravitreal Avastin OD to control and maintain vision at 6-week interval exam      ICD-10-CM   1. Severe nonproliferative diabetic retinopathy of right eye, with macular edema, associated with type 2 diabetes mellitus (HCC)  E11.3411 OCT, Retina - OU - Both Eyes    Intravitreal Injection, Pharmacologic Agent - OD - Right Eye    Bevacizumab  (AVASTIN) SOLN 1.25 mg  2. Moderate nonproliferative diabetic retinopathy of right eye with macular edema associated with type 2 diabetes mellitus (HCC)  E11.3311     1.   2.  3.  Ophthalmic Meds Ordered this visit:  Meds ordered this encounter  Medications  . Bevacizumab (AVASTIN) SOLN 1.25 mg       Return in about 6 weeks (around 07/23/2020) for dilate, OD, AVASTIN OCT.  Patient Instructions  Patient notify us if new symptoms or difficulties arise    Explained the diagnoses, plan, and follow up with the patient and they expressed understanding.  Patient expressed understanding of the importance of proper follow up care.   Alford Highland Jennavie Martinek M.D. Diseases & Surgery of the Retina and Vitreous [facility] 06/11/20     Abbreviations: M myopia (nearsighted); A astigmatism; H hyperopia (farsighted); P presbyopia; Mrx spectacle prescription;  CTL contact lenses; OD right eye; OS left eye; OU both eyes  XT exotropia; ET esotropia; PEK punctate epithelial keratitis; PEE punctate epithelial erosions; DES dry eye syndrome; MGD meibomian gland dysfunction; ATs artificial tears; PFAT's preservative free artificial tears; NSC nuclear sclerotic cataract; PSC posterior subcapsular cataract; ERM epi-retinal membrane; PVD posterior vitreous detachment; RD retinal detachment; DM diabetes mellitus; DR diabetic retinopathy; NPDR non-proliferative diabetic retinopathy; PDR proliferative diabetic retinopathy; CSME clinically significant macular edema; DME diabetic macular edema; dbh dot blot hemorrhages; CWS cotton wool spot; POAG primary open angle glaucoma; C/D cup-to-disc ratio; HVF humphrey visual field; GVF  goldmann visual field; OCT optical coherence tomography; IOP intraocular pressure; BRVO Branch retinal vein occlusion; CRVO central retinal vein occlusion; CRAO central retinal artery occlusion; BRAO branch retinal artery occlusion; RT retinal tear; SB scleral buckle; PPV pars  plana vitrectomy; VH Vitreous hemorrhage; PRP panretinal laser photocoagulation; IVK intravitreal kenalog; VMT vitreomacular traction; MH Macular hole;  NVD neovascularization of the disc; NVE neovascularization elsewhere; AREDS age related eye disease study; ARMD age related macular degeneration; POAG primary open angle glaucoma; EBMD epithelial/anterior basement membrane dystrophy; ACIOL anterior chamber intraocular lens; IOL intraocular lens; PCIOL posterior chamber intraocular lens; Phaco/IOL phacoemulsification with intraocular lens placement; PRK photorefractive keratectomy; LASIK laser assisted in situ keratomileusis; HTN hypertension; DM diabetes mellitus; COPD chronic obstructive pulmonary disease

## 2020-06-16 DIAGNOSIS — E1169 Type 2 diabetes mellitus with other specified complication: Secondary | ICD-10-CM | POA: Diagnosis not present

## 2020-06-16 DIAGNOSIS — E785 Hyperlipidemia, unspecified: Secondary | ICD-10-CM | POA: Diagnosis not present

## 2020-07-07 DIAGNOSIS — E1351 Other specified diabetes mellitus with diabetic peripheral angiopathy without gangrene: Secondary | ICD-10-CM | POA: Diagnosis not present

## 2020-07-07 DIAGNOSIS — L602 Onychogryphosis: Secondary | ICD-10-CM | POA: Diagnosis not present

## 2020-07-17 ENCOUNTER — Other Ambulatory Visit: Payer: Self-pay | Admitting: Internal Medicine

## 2020-07-17 DIAGNOSIS — Z1231 Encounter for screening mammogram for malignant neoplasm of breast: Secondary | ICD-10-CM

## 2020-07-23 ENCOUNTER — Encounter (INDEPENDENT_AMBULATORY_CARE_PROVIDER_SITE_OTHER): Payer: Medicare Other | Admitting: Ophthalmology

## 2020-07-29 ENCOUNTER — Encounter (INDEPENDENT_AMBULATORY_CARE_PROVIDER_SITE_OTHER): Payer: Self-pay | Admitting: Ophthalmology

## 2020-07-29 ENCOUNTER — Ambulatory Visit (INDEPENDENT_AMBULATORY_CARE_PROVIDER_SITE_OTHER): Payer: Medicare Other | Admitting: Ophthalmology

## 2020-07-29 ENCOUNTER — Other Ambulatory Visit: Payer: Self-pay

## 2020-07-29 DIAGNOSIS — E113311 Type 2 diabetes mellitus with moderate nonproliferative diabetic retinopathy with macular edema, right eye: Secondary | ICD-10-CM | POA: Diagnosis not present

## 2020-07-29 DIAGNOSIS — H43821 Vitreomacular adhesion, right eye: Secondary | ICD-10-CM

## 2020-07-29 DIAGNOSIS — H4321 Crystalline deposits in vitreous body, right eye: Secondary | ICD-10-CM | POA: Diagnosis not present

## 2020-07-29 DIAGNOSIS — E113411 Type 2 diabetes mellitus with severe nonproliferative diabetic retinopathy with macular edema, right eye: Secondary | ICD-10-CM

## 2020-07-29 MED ORDER — BEVACIZUMAB CHEMO INJECTION 1.25MG/0.05ML SYRINGE FOR KALEIDOSCOPE
1.2500 mg | INTRAVITREAL | Status: AC | PRN
  Administered 2020-07-29: 1.25 mg via INTRAVITREAL

## 2020-07-29 NOTE — Assessment & Plan Note (Signed)
This has some effect on the progression and persistence of the CME from CSME OD

## 2020-07-29 NOTE — Assessment & Plan Note (Signed)
This condition usually associated with taut vitreous adherence to the retina

## 2020-07-29 NOTE — Assessment & Plan Note (Signed)
Pete intravitreal Avastin OD today

## 2020-07-29 NOTE — Progress Notes (Signed)
07/29/2020     CHIEF COMPLAINT Patient presents for Retina Follow Up   HISTORY OF PRESENT ILLNESS: [name] Mathews is a 86 y.o. female who presents to the clinic today for:   HPI    Retina Follow Up    Patient presents with  Diabetic Retinopathy.  In right eye.  This started 7 weeks ago.  Severity is mild.  Duration of 7 weeks.  Since onset it is stable.          Comments    7 Week Diabetic F/U OD, poss Avastin OD  Pt denies noticeable changes to [place] OU since last visit. Pt denies ocular pain, flashes of light, or floaters OU.  LBS: 128 this AM       Last edited by Ileana Roup, COA on 07/29/2020  8:35 AM. (History)      Referring physician: Rodrigo Ran, MD [address] [place],  [place] [address]  HISTORICAL INFORMATION:   Selected notes from the MEDICAL RECORD NUMBER    Lab Results  Component Value Date   HGBA1C 6.5 (H) 06/06/2019     CURRENT MEDICATIONS: No current outpatient medications on file. (Ophthalmic Drugs)   No current facility-administered medications for this visit. (Ophthalmic Drugs)   Current Outpatient Medications (Other)  Medication Sig  . acetaminophen (TYLENOL) 325 MG tablet Take 2 tablets (650 mg total) by mouth every 4 (four) hours as needed for mild pain ((score 1 to 3) or temp > 100.5).  Marland Kitchen amLODipine (NORVASC) 10 MG tablet Take 1 tablet by mouth once.  Marland Kitchen amLODipine (NORVASC) 5 MG tablet Take 1 tablet (5 mg total) by mouth daily.  Marland Kitchen aspirin 81 MG tablet Take 81 mg by mouth daily.  . Bacillus Coagulans-Inulin (ALIGN PREBIOTIC-PROBIOTIC PO) Take by mouth.  . cefdinir (OMNICEF) 300 MG capsule Take 300 mg by mouth 2 (two) times daily.  . Cholecalciferol (VITAMIN D3) 125 MCG (5000 UT) TABS Take 1 tablet (5,000 Units total) by mouth daily.  . cyclobenzaprine (FLEXERIL) 10 MG tablet Take 1 tablet (10 mg total) by mouth 3 (three) times daily as needed for muscle spasms.  Marland Kitchen docusate sodium (COLACE) 250 MG capsule Take 250 mg by mouth daily.  .  Ferrous Sulfate (SLOW RELEASE IRON PO) Take 1 tablet by mouth daily.  . fluticasone (FLONASE) 50 MCG/ACT nasal spray Place 1 spray into both nostrils daily as needed for allergies or rhinitis.  . furosemide (LASIX) 20 MG tablet Take 20 mg by mouth daily.  Marland Kitchen galantamine (RAZADYNE ER) 16 MG 24 hr capsule Take 16 mg by mouth every evening.  . hydrALAZINE (APRESOLINE) 25 MG tablet Take 25 mg by mouth 3 (three) times daily.  . Lactobacillus-Inulin (PROBIOTIC DIGESTIVE SUPPORT PO) Take 2 each by mouth every morning.  . Lancets (ONETOUCH DELICA PLUS LANCET33G) MISC Apply 1 each topically daily.  Marland Kitchen linaclotide (LINZESS) 145 MCG CAPS capsule Take 145 mcg by mouth every other day.  . Multiple Vitamins-Minerals (MULTIVITAMIN PO) Take 2 each by mouth daily.   Marland Kitchen MYRBETRIQ 50 MG TB24 tablet Take 50 mg by mouth daily.  . nitrofurantoin, macrocrystal-monohydrate, (MACROBID) 100 MG capsule Take 100 mg by mouth 2 (two) times daily. For 14 days  . ONETOUCH VERIO test strip USE 1 TEST STRIP TO CHECK BLOOD GLUCOSE ONCE A DAY DX  E11.9  . pioglitazone (ACTOS) 30 MG tablet Take 1 tablet (30 mg total) by mouth daily.  . rosuvastatin (CRESTOR) 5 MG tablet Take 1 tablet (5 mg total) by mouth  daily.  . telmisartan (MICARDIS) 40 MG tablet Take 40 mg by mouth daily.  Marland Kitchen telmisartan (MICARDIS) 80 MG tablet Take 80 mg by mouth daily.  . vitamin B-12 (CYANOCOBALAMIN) 1000 MCG tablet Take 1,000 mcg by mouth daily. OTC  . vitamin C (ASCORBIC ACID) 500 MG tablet Take 500 mg by mouth daily.   No current facility-administered medications for this visit. (Other)      REVIEW OF SYSTEMS:    ALLERGIES Allergies  Allergen Reactions  . Januvia [Sitagliptin] Other (See Comments)    Numbness in mouth  . Simvastatin Other (See Comments)    tremors    PAST MEDICAL HISTORY Past Medical History:  Diagnosis Date  . Anemia    hx of  . Arthritis   . Breast lump    hx of  . Cataract    left eye  . Chronic back pain   .  Diabetes mellitus   . Hyperlipidemia   . Hypertension    sees Dr. Margret Chance, [facility] internal medicine  . Normochromic normocytic anemia 05/22/2017   Hb 10 MCV 85.6 No monoclonal spike on SPEP  Creatinine 1.3-1.6  . Peripheral vascular disease [facility])    Past Surgical History:  Procedure Laterality Date  . APPENDECTOMY    . BACK SURGERY    . BREAST CYST EXCISION Left   . BREAST SURGERY     cyst removed left side  . BUNIONECTOMY    . CARDIOVASCULAR STRESS TEST    . COLONOSCOPY    . DILATION AND CURETTAGE OF UTERUS    . TONSILLECTOMY      FAMILY HISTORY History reviewed. No pertinent family history.  SOCIAL HISTORY Social History   Tobacco Use  . Smoking status: Never Smoker  . Smokeless tobacco: Never Used  Vaping Use  . Vaping Use: Never used  Substance Use Topics  . Alcohol use: No  . Drug use: No         OPHTHALMIC EXAM: Base Eye Exam    Visual Acuity (ETDRS)      Right Left   Dist cc 20/20 -2 20/25 +1   Correction: Glasses       Tonometry (Tonopen, 8:36 AM)      Right Left   Pressure 19 19       Pupils      Pupils Dark Light Shape React APD   Right PERRL 3 2 Round Slow None   Left PERRL 2 1 Round Slow None       Visual Fields (Counting fingers)      Left Right    Full Full       Extraocular Movement      Right Left    Full Full       Neuro/Psych    Oriented x3: Yes   Mood/Affect: Normal       Dilation    Right eye: 1.0% Mydriacyl, 2.5% Phenylephrine @ 8:38 AM        Slit Lamp and Fundus Exam    External Exam      Right Left   External Normal Normal       Slit Lamp Exam      Right Left   Lids/Lashes Normal Normal   Conjunctiva/Sclera White and quiet White and quiet   Cornea Clear Clear   Anterior Chamber Deep and quiet Deep and quiet   Iris Round and reactive Round and reactive   Lens Centered posterior chamber intraocular lens 2+ Nuclear sclerosis   Anterior Vitreous  Normal Normal       Fundus Exam      Right  Left   Posterior Vitreous Asteroid hyalosis    Disc Normal    C/D Ratio 0.5    Macula Macular thickening, Microaneurysms, Mild clinically significant macular edema    Vessels NPDR- Moderate    Periphery Normal           IMAGING AND PROCEDURES  Imaging and Procedures for 07/29/20  OCT, Retina - OU - Both Eyes       Right Eye Quality was good. Scan locations included subfoveal. Central Foveal Thickness: 359. Progression has improved. Findings include vitreous traction, cystoid macular edema.   Left Eye Quality was good. Scan locations included subfoveal. Central Foveal Thickness: 255. Progression has been stable.   Notes OD with vitreous traction on the macula with elevation in the retina, thus likely contributing to some persistence of his CME on the right eye.       Intravitreal Injection, Pharmacologic Agent - OD - Right Eye       Time Out 07/29/2020. 9:40 AM. Confirmed correct patient, procedure, site, and patient consented.   Anesthesia Topical anesthesia was used. Anesthetic medications included Akten 3.5%.   Procedure Preparation included Ofloxacin , Tobramycin 0.3%, 5% betadine to ocular surface, 10% betadine to eyelids. A supplied needle was used.   Injection:  1.25 mg Bevacizumab (AVASTIN) SOLN   NDC: [address]-669194-0334-1, Lot: [id number]   Route: Intravitreal, Site: Right Eye, Waste: 0 mg  Post-op Post injection exam found visual acuity of at least counting fingers. The patient tolerated the procedure well. There were no complications. The patient received written and verbal post procedure care education. Post injection medications were not given.                 ASSESSMENT/PLAN:  Vitreomacular adhesion of right eye This has some effect on the progression and persistence of the CME from CSME OD  Moderate nonproliferative diabetic retinopathy of right eye with macular edema (HCC) Pete intravitreal Avastin OD today  Asteroid hyalosis of right eye This  condition usually associated with taut vitreous adherence to the retina       ICD-10-CM   1. Severe nonproliferative diabetic retinopathy of right eye, with macular edema, associated with type 2 diabetes mellitus (HCC)  E11.3411 OCT, Retina - OU - Both Eyes    Intravitreal Injection, Pharmacologic Agent - OD - Right Eye    Bevacizumab (AVASTIN) SOLN 1.25 mg  2. Vitreomacular adhesion of right eye  H43.821 Intravitreal Injection, Pharmacologic Agent - OD - Right Eye    Bevacizumab (AVASTIN) SOLN 1.25 mg  3. Moderate nonproliferative diabetic retinopathy of right eye with macular edema associated with type 2 diabetes mellitus (HCC)  E11.3311   4. Asteroid hyalosis of right eye  H43.21     1.  Repeat intravitreal Avastin OD today for persistent CSME.  2. I Do believe there is some component of vitreal macular traction contributing to this focal CME change in the right eye.  I discussed this with the patient and family.  Some consideration the future for vitrectomy and release of the vitreal macular traction may be required.  3.  Follow-up in November 2021 as scheduled and reassess.  Ophthalmic Meds Ordered this visit:  Meds ordered this encounter  Medications  . Bevacizumab (AVASTIN) SOLN 1.25 mg       Return in about 8 weeks (around 09/23/2020) for dilate, OD, AVASTIN OCT.  There are no Patient Instructions on file  for this visit.   Explained the diagnoses, plan, and follow up with the patient and they expressed understanding.  Patient expressed understanding of the importance of proper follow up care.   Alford Highland Chistina Roston M.D. Diseases & Surgery of the Retina and Vitreous [facility] 07/29/20     Abbreviations: M myopia (nearsighted); A astigmatism; H hyperopia (farsighted); P presbyopia; Mrx spectacle prescription;  CTL contact lenses; OD right eye; OS left eye; OU both eyes  XT exotropia; ET esotropia; PEK punctate epithelial keratitis; PEE punctate  epithelial erosions; DES dry eye syndrome; MGD meibomian gland dysfunction; ATs artificial tears; PFAT's preservative free artificial tears; NSC nuclear sclerotic cataract; PSC posterior subcapsular cataract; ERM epi-retinal membrane; PVD posterior vitreous detachment; RD retinal detachment; DM diabetes mellitus; DR diabetic retinopathy; NPDR non-proliferative diabetic retinopathy; PDR proliferative diabetic retinopathy; CSME clinically significant macular edema; DME diabetic macular edema; dbh dot blot hemorrhages; CWS cotton wool spot; POAG primary open angle glaucoma; C/D cup-to-disc ratio; HVF humphrey visual field; GVF goldmann visual field; OCT optical coherence tomography; IOP intraocular pressure; BRVO Branch retinal vein occlusion; CRVO central retinal vein occlusion; CRAO central retinal artery occlusion; BRAO branch retinal artery occlusion; RT retinal tear; SB scleral buckle; PPV pars plana vitrectomy; VH Vitreous hemorrhage; PRP panretinal laser photocoagulation; IVK intravitreal kenalog; VMT vitreomacular traction; MH Macular hole;  NVD neovascularization of the disc; NVE neovascularization elsewhere; AREDS age related eye disease study; ARMD age related macular degeneration; POAG primary open angle glaucoma; EBMD epithelial/anterior basement membrane dystrophy; ACIOL anterior chamber intraocular lens; IOL intraocular lens; PCIOL posterior chamber intraocular lens; Phaco/IOL phacoemulsification with intraocular lens placement; PRK photorefractive keratectomy; LASIK laser assisted in situ keratomileusis; HTN hypertension; DM diabetes mellitus; COPD chronic obstructive pulmonary disease

## 2020-07-31 DIAGNOSIS — Z23 Encounter for immunization: Secondary | ICD-10-CM | POA: Diagnosis not present

## 2020-08-24 ENCOUNTER — Ambulatory Visit: Payer: Medicare Other

## 2020-09-09 DIAGNOSIS — E1351 Other specified diabetes mellitus with diabetic peripheral angiopathy without gangrene: Secondary | ICD-10-CM | POA: Diagnosis not present

## 2020-09-09 DIAGNOSIS — L602 Onychogryphosis: Secondary | ICD-10-CM | POA: Diagnosis not present

## 2020-09-28 ENCOUNTER — Encounter (INDEPENDENT_AMBULATORY_CARE_PROVIDER_SITE_OTHER): Payer: Medicare Other | Admitting: Ophthalmology

## 2020-10-07 ENCOUNTER — Encounter (INDEPENDENT_AMBULATORY_CARE_PROVIDER_SITE_OTHER): Payer: Medicare Other | Admitting: Ophthalmology

## 2020-10-09 ENCOUNTER — Ambulatory Visit: Payer: Medicare Other

## 2020-10-12 ENCOUNTER — Ambulatory Visit (INDEPENDENT_AMBULATORY_CARE_PROVIDER_SITE_OTHER): Payer: Medicare Other | Admitting: Ophthalmology

## 2020-10-12 ENCOUNTER — Other Ambulatory Visit: Payer: Self-pay

## 2020-10-12 ENCOUNTER — Encounter (INDEPENDENT_AMBULATORY_CARE_PROVIDER_SITE_OTHER): Payer: Self-pay | Admitting: Ophthalmology

## 2020-10-12 DIAGNOSIS — E113311 Type 2 diabetes mellitus with moderate nonproliferative diabetic retinopathy with macular edema, right eye: Secondary | ICD-10-CM | POA: Diagnosis not present

## 2020-10-12 DIAGNOSIS — H43821 Vitreomacular adhesion, right eye: Secondary | ICD-10-CM | POA: Diagnosis not present

## 2020-10-12 DIAGNOSIS — H4321 Crystalline deposits in vitreous body, right eye: Secondary | ICD-10-CM | POA: Diagnosis not present

## 2020-10-12 DIAGNOSIS — H43822 Vitreomacular adhesion, left eye: Secondary | ICD-10-CM

## 2020-10-12 DIAGNOSIS — E113411 Type 2 diabetes mellitus with severe nonproliferative diabetic retinopathy with macular edema, right eye: Secondary | ICD-10-CM | POA: Diagnosis not present

## 2020-10-12 MED ORDER — BEVACIZUMAB 2.5 MG/0.1ML IZ SOSY
2.5000 mg | PREFILLED_SYRINGE | INTRAVITREAL | Status: AC | PRN
  Administered 2020-10-12: 2.5 mg via INTRAVITREAL

## 2020-10-12 NOTE — Assessment & Plan Note (Signed)
Only with partial PVD, rare to see a complete PVD and asteroid hyalosis

## 2020-10-12 NOTE — Assessment & Plan Note (Signed)
Severe vitreomacular traction right eye with cystoid change also making CSME worse.  Some component of adherence of the vitreomacular traction due to con current asteroid hyalosis

## 2020-10-12 NOTE — Progress Notes (Signed)
10/12/2020     CHIEF COMPLAINT Patient presents for Retina Follow Up   HISTORY OF PRESENT ILLNESS: [name] Mathews is a 86 y.o. female who presents to the clinic today for:   HPI    Retina Follow Up    Patient presents with  Diabetic Retinopathy.  In right eye.  Severity is severe.  Duration of 10 weeks.  Since onset it is stable.  I, the attending physician,  performed the HPI with the patient and updated documentation appropriately.          Comments    10 Week NPDR f\u OD. Possible Avastin OD, OCT  Pt states vision is stable. Denies any changes. BGL: 131 A1C: 6.5       Last edited by Elyse Jarvis on 10/12/2020  8:39 AM. (History)      Referring physician: Rodrigo Ran, MD [address] [place],  [place] [address]  HISTORICAL INFORMATION:   Selected notes from the MEDICAL RECORD NUMBER    Lab Results  Component Value Date   HGBA1C 6.5 (H) 06/06/2019     CURRENT MEDICATIONS: No current outpatient medications on file. (Ophthalmic Drugs)   No current facility-administered medications for this visit. (Ophthalmic Drugs)   Current Outpatient Medications (Other)  Medication Sig  . acetaminophen (TYLENOL) 325 MG tablet Take 2 tablets (650 mg total) by mouth every 4 (four) hours as needed for mild pain ((score 1 to 3) or temp > 100.5).  Marland Kitchen amLODipine (NORVASC) 10 MG tablet Take 1 tablet by mouth once.  Marland Kitchen amLODipine (NORVASC) 5 MG tablet Take 1 tablet (5 mg total) by mouth daily.  Marland Kitchen aspirin 81 MG tablet Take 81 mg by mouth daily.  . Bacillus Coagulans-Inulin (ALIGN PREBIOTIC-PROBIOTIC PO) Take by mouth.  . cefdinir (OMNICEF) 300 MG capsule Take 300 mg by mouth 2 (two) times daily.  . Cholecalciferol (VITAMIN D3) 125 MCG (5000 UT) TABS Take 1 tablet (5,000 Units total) by mouth daily.  . cyclobenzaprine (FLEXERIL) 10 MG tablet Take 1 tablet (10 mg total) by mouth 3 (three) times daily as needed for muscle spasms.  Marland Kitchen docusate sodium (COLACE) 250 MG capsule Take 250  mg by mouth daily.  . Ferrous Sulfate (SLOW RELEASE IRON PO) Take 1 tablet by mouth daily.  . fluticasone (FLONASE) 50 MCG/ACT nasal spray Place 1 spray into both nostrils daily as needed for allergies or rhinitis.  . furosemide (LASIX) 20 MG tablet Take 20 mg by mouth daily.  Marland Kitchen galantamine (RAZADYNE ER) 16 MG 24 hr capsule Take 16 mg by mouth every evening.  . hydrALAZINE (APRESOLINE) 25 MG tablet Take 25 mg by mouth 3 (three) times daily.  . Lactobacillus-Inulin (PROBIOTIC DIGESTIVE SUPPORT PO) Take 2 each by mouth every morning.  . Lancets (ONETOUCH DELICA PLUS LANCET33G) MISC Apply 1 each topically daily.  Marland Kitchen linaclotide (LINZESS) 145 MCG CAPS capsule Take 145 mcg by mouth every other day.  . Multiple Vitamins-Minerals (MULTIVITAMIN PO) Take 2 each by mouth daily.   Marland Kitchen MYRBETRIQ 50 MG TB24 tablet Take 50 mg by mouth daily.  . nitrofurantoin, macrocrystal-monohydrate, (MACROBID) 100 MG capsule Take 100 mg by mouth 2 (two) times daily. For 14 days  . ONETOUCH VERIO test strip USE 1 TEST STRIP TO CHECK BLOOD GLUCOSE ONCE A DAY DX  E11.9  . pioglitazone (ACTOS) 30 MG tablet Take 1 tablet (30 mg total) by mouth daily.  . rosuvastatin (CRESTOR) 5 MG tablet Take 1 tablet (5 mg total) by mouth daily.  Marland Kitchen  telmisartan (MICARDIS) 40 MG tablet Take 40 mg by mouth daily.  Marland Kitchen telmisartan (MICARDIS) 80 MG tablet Take 80 mg by mouth daily.  . vitamin B-12 (CYANOCOBALAMIN) 1000 MCG tablet Take 1,000 mcg by mouth daily. OTC  . vitamin C (ASCORBIC ACID) 500 MG tablet Take 500 mg by mouth daily.   No current facility-administered medications for this visit. (Other)      REVIEW OF SYSTEMS: ROS    Positive for: Endocrine   Last edited by Elyse Jarvis on 10/12/2020  8:38 AM. (History)       ALLERGIES Allergies  Allergen Reactions  . Januvia [Sitagliptin] Other (See Comments)    Numbness in mouth  . Simvastatin Other (See Comments)    tremors    PAST MEDICAL HISTORY Past Medical History:   Diagnosis Date  . Anemia    hx of  . Arthritis   . Breast lump    hx of  . Cataract    left eye  . Chronic back pain   . Diabetes mellitus   . Hyperlipidemia   . Hypertension    sees Dr. Margret Chance, [facility] internal medicine  . Normochromic normocytic anemia 05/22/2017   Hb 10 MCV 85.6 No monoclonal spike on SPEP  Creatinine 1.3-1.6  . Peripheral vascular disease [facility])    Past Surgical History:  Procedure Laterality Date  . APPENDECTOMY    . BACK SURGERY    . BREAST CYST EXCISION Left   . BREAST SURGERY     cyst removed left side  . BUNIONECTOMY    . CARDIOVASCULAR STRESS TEST    . COLONOSCOPY    . DILATION AND CURETTAGE OF UTERUS    . TONSILLECTOMY      FAMILY HISTORY History reviewed. No pertinent family history.  SOCIAL HISTORY Social History   Tobacco Use  . Smoking status: Never Smoker  . Smokeless tobacco: Never Used  Vaping Use  . Vaping Use: Never used  Substance Use Topics  . Alcohol use: No  . Drug use: No         OPHTHALMIC EXAM: Base Eye Exam    Visual Acuity (Snellen - Linear)      Right Left   Dist [place] 20/30 20/30 -1       Tonometry (Tonopen, 8:44 AM)      Right Left   Pressure 12 15       Pupils      Pupils Dark Light Shape React APD   Right PERRL 3 2 Round Brisk None   Left PERRL 3 2 Round Brisk None       Visual Fields (Counting fingers)      Left Right    Full Full       Neuro/Psych    Oriented x3: Yes   Mood/Affect: Normal       Dilation    Right eye: 1.0% Mydriacyl, 2.5% Phenylephrine @ 8:44 AM        Slit Lamp and Fundus Exam    External Exam      Right Left   External Normal Normal       Slit Lamp Exam      Right Left   Lids/Lashes Normal Normal   Conjunctiva/Sclera White and quiet White and quiet   Cornea Clear Clear   Anterior Chamber Deep and quiet Deep and quiet   Iris Round and reactive Round and reactive   Lens Centered posterior chamber intraocular lens 2+ Nuclear sclerosis   Anterior  Vitreous Normal  Normal       Fundus Exam      Right Left   Posterior Vitreous Asteroid hyalosis    Disc Normal    C/D Ratio 0.5    Macula Macular thickening, Microaneurysms, Mild clinically significant macular edema    Vessels NPDR- Moderate    Periphery Normal           IMAGING AND PROCEDURES  Imaging and Procedures for 10/12/20  OCT, Retina - OU - Both Eyes       Right Eye Quality was good. Scan locations included subfoveal. Central Foveal Thickness: 412. Progression has worsened. Findings include vitreomacular adhesion , cystoid macular edema.   Left Eye Quality was good. Scan locations included subfoveal. Central Foveal Thickness: 261. Progression has been stable. Findings include vitreomacular adhesion .   Notes Vitreomacular traction with elevation of the central fovea accounts with the cystoid change in the right eye, slightly worse so some component of CSME yet actual change persists       Intravitreal Injection, Pharmacologic Agent - OD - Right Eye       Time Out 10/12/2020. 9:24 AM. Confirmed correct patient, procedure, site, and patient consented.   Anesthesia Topical anesthesia was used. Anesthetic medications included Akten 3.5%.   Procedure Preparation included Ofloxacin , Tobramycin 0.3%, 5% betadine to ocular surface, 10% betadine to eyelids. A supplied needle was used.   Injection:  2.5 mg Bevacizumab (AVASTIN) 2.5mg /0.64mL SOSY   NDC: 09323-557-32, Lot: [id number]   Route: Intravitreal, Site: Right Eye  Post-op Post injection exam found visual acuity of at least counting fingers. The patient tolerated the procedure well. There were no complications. The patient received written and verbal post procedure care education. Post injection medications were not given.                 ASSESSMENT/PLAN:  Vitreomacular adhesion of left eye Left eye with vitreomacular adhesion yet no traction and no change on the inner retina,  observe  Vitreomacular adhesion of right eye Severe vitreomacular traction right eye with cystoid change also making CSME worse.  Some component of adherence of the vitreomacular traction due to con current asteroid hyalosis  Asteroid hyalosis of right eye Only with partial PVD, rare to see a complete PVD and asteroid hyalosis  Moderate nonproliferative diabetic retinopathy of right eye with macular edema (HCC) CSME OD only slightly increased at 10-week follow-up, will repeat injection today to protect the macula from CSME worsening and will schedule vitrectomy for vitreal macular traction hoping to 1 day decrease the treatment burden of this patient for CSME      ICD-10-CM   1. Severe nonproliferative diabetic retinopathy of right eye, with macular edema, associated with type 2 diabetes mellitus (HCC)  E11.3411 OCT, Retina - OU - Both Eyes    Intravitreal Injection, Pharmacologic Agent - OD - Right Eye    bevacizumab (AVASTIN) SOSY 2.5 mg  2. Vitreomacular adhesion of left eye  H43.822   3. Vitreomacular adhesion of right eye  H43.821   4. Asteroid hyalosis of right eye  H43.21   5. Moderate nonproliferative diabetic retinopathy of right eye with macular edema associated with type 2 diabetes mellitus (HCC)  E11.3311     1.  2.  3.  Ophthalmic Meds Ordered this visit:  Meds ordered this encounter  Medications  . bevacizumab (AVASTIN) SOSY 2.5 mg       Return ,,, [address]237, S28.315, for Schedule vitrectomy OD, [facility].  There are no  Patient Instructions on file for this visit.   Explained the diagnoses, plan, and follow up with the patient and they expressed understanding.  Patient expressed understanding of the importance of proper follow up care.   Alford HighlandGary A. Sanai Frick M.D. Diseases & Surgery of the Retina and Vitreous [facility] 10/12/20     Abbreviations: M myopia (nearsighted); A astigmatism; H hyperopia (farsighted); P presbyopia;  Mrx spectacle prescription;  CTL contact lenses; OD right eye; OS left eye; OU both eyes  XT exotropia; ET esotropia; PEK punctate epithelial keratitis; PEE punctate epithelial erosions; DES dry eye syndrome; MGD meibomian gland dysfunction; ATs artificial tears; PFAT's preservative free artificial tears; NSC nuclear sclerotic cataract; PSC posterior subcapsular cataract; ERM epi-retinal membrane; PVD posterior vitreous detachment; RD retinal detachment; DM diabetes mellitus; DR diabetic retinopathy; NPDR non-proliferative diabetic retinopathy; PDR proliferative diabetic retinopathy; CSME clinically significant macular edema; DME diabetic macular edema; dbh dot blot hemorrhages; CWS cotton wool spot; POAG primary open angle glaucoma; C/D cup-to-disc ratio; HVF humphrey visual field; GVF goldmann visual field; OCT optical coherence tomography; IOP intraocular pressure; BRVO Branch retinal vein occlusion; CRVO central retinal vein occlusion; CRAO central retinal artery occlusion; BRAO branch retinal artery occlusion; RT retinal tear; SB scleral buckle; PPV pars plana vitrectomy; VH Vitreous hemorrhage; PRP panretinal laser photocoagulation; IVK intravitreal kenalog; VMT vitreomacular traction; MH Macular hole;  NVD neovascularization of the disc; NVE neovascularization elsewhere; AREDS age related eye disease study; ARMD age related macular degeneration; POAG primary open angle glaucoma; EBMD epithelial/anterior basement membrane dystrophy; ACIOL anterior chamber intraocular lens; IOL intraocular lens; PCIOL posterior chamber intraocular lens; Phaco/IOL phacoemulsification with intraocular lens placement; PRK photorefractive keratectomy; LASIK laser assisted in situ keratomileusis; HTN hypertension; DM diabetes mellitus; COPD chronic obstructive pulmonary disease

## 2020-10-12 NOTE — Assessment & Plan Note (Signed)
CSME OD only slightly increased at 10-week follow-up, will repeat injection today to protect the macula from CSME worsening and will schedule vitrectomy for vitreal macular traction hoping to 1 day decrease the treatment burden of this patient for CSME

## 2020-10-12 NOTE — Assessment & Plan Note (Signed)
Left eye with vitreomacular adhesion yet no traction and no change on the inner retina, observe

## 2020-10-14 DIAGNOSIS — N390 Urinary tract infection, site not specified: Secondary | ICD-10-CM | POA: Diagnosis not present

## 2020-10-14 DIAGNOSIS — R300 Dysuria: Secondary | ICD-10-CM | POA: Diagnosis not present

## 2020-11-04 ENCOUNTER — Other Ambulatory Visit: Payer: Self-pay

## 2020-11-04 ENCOUNTER — Ambulatory Visit (INDEPENDENT_AMBULATORY_CARE_PROVIDER_SITE_OTHER): Payer: Medicare Other

## 2020-11-04 DIAGNOSIS — H43821 Vitreomacular adhesion, right eye: Secondary | ICD-10-CM

## 2020-11-04 MED ORDER — OFLOXACIN 0.3 % OP SOLN: 1.0000 [drp] | Freq: Four times a day (QID) | OPHTHALMIC | 0 refills | Status: AC

## 2020-11-04 MED ORDER — PREDNISOLONE ACETATE 1 % OP SUSP: 1.0000 [drp] | Freq: Four times a day (QID) | OPHTHALMIC | 0 refills | Status: AC

## 2020-11-04 MED ORDER — PREDNISOLONE ACETATE 1 % OP SUSP: 1.0000 [drp] | Freq: Four times a day (QID) | OPHTHALMIC | 0 refills | Status: DC

## 2020-11-11 ENCOUNTER — Encounter (AMBULATORY_SURGERY_CENTER): Payer: Medicare Other | Admitting: Ophthalmology

## 2020-11-11 DIAGNOSIS — E113511 Type 2 diabetes mellitus with proliferative diabetic retinopathy with macular edema, right eye: Secondary | ICD-10-CM | POA: Diagnosis not present

## 2020-11-11 DIAGNOSIS — E113591 Type 2 diabetes mellitus with proliferative diabetic retinopathy without macular edema, right eye: Secondary | ICD-10-CM

## 2020-11-11 DIAGNOSIS — H43821 Vitreomacular adhesion, right eye: Secondary | ICD-10-CM

## 2020-11-12 ENCOUNTER — Other Ambulatory Visit: Payer: Self-pay

## 2020-11-12 ENCOUNTER — Encounter (INDEPENDENT_AMBULATORY_CARE_PROVIDER_SITE_OTHER): Payer: Self-pay | Admitting: Ophthalmology

## 2020-11-12 ENCOUNTER — Ambulatory Visit (INDEPENDENT_AMBULATORY_CARE_PROVIDER_SITE_OTHER): Payer: Medicare Other | Admitting: Ophthalmology

## 2020-11-12 DIAGNOSIS — E113591 Type 2 diabetes mellitus with proliferative diabetic retinopathy without macular edema, right eye: Secondary | ICD-10-CM

## 2020-11-12 DIAGNOSIS — H43821 Vitreomacular adhesion, right eye: Secondary | ICD-10-CM

## 2020-11-12 NOTE — Patient Instructions (Signed)
No lifting and bending for 1 week. No water in the eye for 10 days. Do not rub the eye. Wear shield at night for 1-3 days.  Wear your CPAP as normal, if instructed by your doctor.  Continue your topical medications for a total of 3 weeks.  Do not refill your postoperative medications unless instructed.  Patient instructed to continue topical drop therapy right eye  Prednisolone acetate 1 drop right eye 4 times daily  Ofloxacin 1 drop right eye 4 times daily  To use these medications for total of 3 weeks

## 2020-11-12 NOTE — Progress Notes (Signed)
11/12/2020     CHIEF COMPLAINT Patient presents for Post-op Follow-up (1 Day s\p PPV OD, vit laser for PDR, AND VMT/Pt states OD is sore, took Tylenol yesterday. Pt slept off and on last night.)   HISTORY OF PRESENT ILLNESS: [name] Mathews is a 87 y.o. female who presents to the clinic today for:   HPI    Post-op Follow-up    In right eye.  Discomfort includes pain.  Vision is stable.  I, the attending physician,  performed the HPI with the patient and updated documentation appropriately. Additional comments: 1 Day s\p PPV OD, vit laser for PDR, AND VMT Pt states OD is sore, took Tylenol yesterday. Pt slept off and on last night.       Last edited by Edmon Crape, MD on 11/12/2020 10:20 AM. (History)      Referring physician: Rodrigo Ran, MD [address] [place],  [place] [address]  HISTORICAL INFORMATION:   Selected notes from the MEDICAL RECORD NUMBER    Lab Results  Component Value Date   HGBA1C 6.5 (H) 06/06/2019     CURRENT MEDICATIONS: Current Outpatient Medications (Ophthalmic Drugs)  Medication Sig  . ofloxacin (OCUFLOX) 0.3 % ophthalmic solution Place 1 drop into the right eye 4 (four) times daily for 21 days.  . prednisoLONE acetate (PRED FORTE) 1 % ophthalmic suspension Place 1 drop into the right eye 4 (four) times daily for 21 days.   No current facility-administered medications for this visit. (Ophthalmic Drugs)   Current Outpatient Medications (Other)  Medication Sig  . acetaminophen (TYLENOL) 325 MG tablet Take 2 tablets (650 mg total) by mouth every 4 (four) hours as needed for mild pain ((score 1 to 3) or temp > 100.5).  Marland Kitchen amLODipine (NORVASC) 10 MG tablet Take 1 tablet by mouth once.  Marland Kitchen amLODipine (NORVASC) 5 MG tablet Take 1 tablet (5 mg total) by mouth daily.  Marland Kitchen aspirin 81 MG tablet Take 81 mg by mouth daily.  . Bacillus Coagulans-Inulin (ALIGN PREBIOTIC-PROBIOTIC PO) Take by mouth.  . cefdinir (OMNICEF) 300 MG capsule Take 300 mg by mouth 2  (two) times daily.  . Cholecalciferol (VITAMIN D3) 125 MCG (5000 UT) TABS Take 1 tablet (5,000 Units total) by mouth daily.  . cyclobenzaprine (FLEXERIL) 10 MG tablet Take 1 tablet (10 mg total) by mouth 3 (three) times daily as needed for muscle spasms.  Marland Kitchen docusate sodium (COLACE) 250 MG capsule Take 250 mg by mouth daily.  . Ferrous Sulfate (SLOW RELEASE IRON PO) Take 1 tablet by mouth daily.  . fluticasone (FLONASE) 50 MCG/ACT nasal spray Place 1 spray into both nostrils daily as needed for allergies or rhinitis.  . furosemide (LASIX) 20 MG tablet Take 20 mg by mouth daily.  Marland Kitchen galantamine (RAZADYNE ER) 16 MG 24 hr capsule Take 16 mg by mouth every evening.  . hydrALAZINE (APRESOLINE) 25 MG tablet Take 25 mg by mouth 3 (three) times daily.  . Lactobacillus-Inulin (PROBIOTIC DIGESTIVE SUPPORT PO) Take 2 each by mouth every morning.  . Lancets (ONETOUCH DELICA PLUS LANCET33G) MISC Apply 1 each topically daily.  Marland Kitchen linaclotide (LINZESS) 145 MCG CAPS capsule Take 145 mcg by mouth every other day.  . Multiple Vitamins-Minerals (MULTIVITAMIN PO) Take 2 each by mouth daily.   Marland Kitchen MYRBETRIQ 50 MG TB24 tablet Take 50 mg by mouth daily.  . nitrofurantoin, macrocrystal-monohydrate, (MACROBID) 100 MG capsule Take 100 mg by mouth 2 (two) times daily. For 14 days  . ONETOUCH VERIO test  strip USE 1 TEST STRIP TO CHECK BLOOD GLUCOSE ONCE A DAY DX  E11.9  . pioglitazone (ACTOS) 30 MG tablet Take 1 tablet (30 mg total) by mouth daily.  . rosuvastatin (CRESTOR) 5 MG tablet Take 1 tablet (5 mg total) by mouth daily.  Marland Kitchen telmisartan (MICARDIS) 40 MG tablet Take 40 mg by mouth daily.  Marland Kitchen telmisartan (MICARDIS) 80 MG tablet Take 80 mg by mouth daily.  . vitamin B-12 (CYANOCOBALAMIN) 1000 MCG tablet Take 1,000 mcg by mouth daily. OTC  . vitamin C (ASCORBIC ACID) 500 MG tablet Take 500 mg by mouth daily.   No current facility-administered medications for this visit. (Other)      REVIEW OF  SYSTEMS:    ALLERGIES Allergies  Allergen Reactions  . Januvia [Sitagliptin] Other (See Comments)    Numbness in mouth  . Simvastatin Other (See Comments)    tremors    PAST MEDICAL HISTORY Past Medical History:  Diagnosis Date  . Anemia    hx of  . Arthritis   . Breast lump    hx of  . Cataract    left eye  . Chronic back pain   . Diabetes mellitus   . Hyperlipidemia   . Hypertension    sees Dr. Margret Chance, [facility] internal medicine  . Normochromic normocytic anemia 05/22/2017   Hb 10 MCV 85.6 No monoclonal spike on SPEP  Creatinine 1.3-1.6  . Peripheral vascular disease [facility])    Past Surgical History:  Procedure Laterality Date  . APPENDECTOMY    . BACK SURGERY    . BREAST CYST EXCISION Left   . BREAST SURGERY     cyst removed left side  . BUNIONECTOMY    . CARDIOVASCULAR STRESS TEST    . COLONOSCOPY    . DILATION AND CURETTAGE OF UTERUS    . TONSILLECTOMY      FAMILY HISTORY History reviewed. No pertinent family history.  SOCIAL HISTORY Social History   Tobacco Use  . Smoking status: Never Smoker  . Smokeless tobacco: Never Used  Vaping Use  . Vaping Use: Never used  Substance Use Topics  . Alcohol use: No  . Drug use: No         OPHTHALMIC EXAM: Base Eye Exam    Visual Acuity (Snellen - Linear)      Right Left   Dist cc 20/40 -2 20/25   Dist ph cc 20/30 -2    Correction: Glasses       Tonometry (Tonopen, 9:45 AM)      Right Left   Pressure 6 13       Pupils      Dark Light Shape React   Right 5 5 Round Dilated   Left           Neuro/Psych    Oriented x3: Yes   Mood/Affect: Normal       Dilation    Right eye: 1.0% Mydriacyl, 2.5% Phenylephrine @ 9:42 AM        Slit Lamp and Fundus Exam    External Exam      Right Left   External Normal Normal       Slit Lamp Exam      Right Left   Lids/Lashes Normal Normal   Conjunctiva/Sclera White and quiet White and quiet   Cornea Clear Clear   Anterior Chamber Deep  and quiet Deep and quiet   Iris Round and reactive Round and reactive   Lens Centered posterior chamber intraocular  lens 2+ Nuclear sclerosis   Anterior Vitreous Normal clear Normal       Fundus Exam      Right Left   Posterior Vitreous Clear, vitrectomized    Disc Normal    C/D Ratio 0.5    Macula Macular thickening, Microaneurysms, Mild clinically significant macular edema    Vessels PDR-quiet    Periphery Good PRP peripheral anterior           IMAGING AND PROCEDURES  Imaging and Procedures for 11/12/20           ASSESSMENT/PLAN:  No problem-specific Assessment & Plan notes found for this encounter.      ICD-10-CM   1. Proliferative diabetic retinopathy of right eye without macular edema associated with type 2 diabetes mellitus (HCC)  E11.3591   2. Vitreomacular adhesion of right eye  H43.821     1.  OD looks great postop day #1 status post vitrectomy, PRP for VMT, and asteroid hyalosis as well as early PDR  2.  3.  Ophthalmic Meds Ordered this visit:  No orders of the defined types were placed in this encounter.      Return in about 1 week (around 11/19/2020) for dilate, OD, OCT, POST OP.  Patient Instructions  No lifting and bending for 1 week. No water in the eye for 10 days. Do not rub the eye. Wear shield at night for 1-3 days.  Wear your CPAP as normal, if instructed by your doctor.  Continue your topical medications for a total of 3 weeks.  Do not refill your postoperative medications unless instructed.  Patient instructed to continue topical drop therapy right eye  Prednisolone acetate 1 drop right eye 4 times daily  Ofloxacin 1 drop right eye 4 times daily  To use these medications for total of 3 weeks    Explained the diagnoses, plan, and follow up with the patient and they expressed understanding.  Patient expressed understanding of the importance of proper follow up care.   Alford Highland Irineo Gaulin M.D. Diseases & Surgery of the Retina and  Vitreous [facility] 11/12/20     Abbreviations: M myopia (nearsighted); A astigmatism; H hyperopia (farsighted); P presbyopia; Mrx spectacle prescription;  CTL contact lenses; OD right eye; OS left eye; OU both eyes  XT exotropia; ET esotropia; PEK punctate epithelial keratitis; PEE punctate epithelial erosions; DES dry eye syndrome; MGD meibomian gland dysfunction; ATs artificial tears; PFAT's preservative free artificial tears; NSC nuclear sclerotic cataract; PSC posterior subcapsular cataract; ERM epi-retinal membrane; PVD posterior vitreous detachment; RD retinal detachment; DM diabetes mellitus; DR diabetic retinopathy; NPDR non-proliferative diabetic retinopathy; PDR proliferative diabetic retinopathy; CSME clinically significant macular edema; DME diabetic macular edema; dbh dot blot hemorrhages; CWS cotton wool spot; POAG primary open angle glaucoma; C/D cup-to-disc ratio; HVF humphrey visual field; GVF goldmann visual field; OCT optical coherence tomography; IOP intraocular pressure; BRVO Branch retinal vein occlusion; CRVO central retinal vein occlusion; CRAO central retinal artery occlusion; BRAO branch retinal artery occlusion; RT retinal tear; SB scleral buckle; PPV pars plana vitrectomy; VH Vitreous hemorrhage; PRP panretinal laser photocoagulation; IVK intravitreal kenalog; VMT vitreomacular traction; MH Macular hole;  NVD neovascularization of the disc; NVE neovascularization elsewhere; AREDS age related eye disease study; ARMD age related macular degeneration; POAG primary open angle glaucoma; EBMD epithelial/anterior basement membrane dystrophy; ACIOL anterior chamber intraocular lens; IOL intraocular lens; PCIOL posterior chamber intraocular lens; Phaco/IOL phacoemulsification with intraocular lens placement; PRK photorefractive keratectomy; LASIK laser assisted in situ keratomileusis; HTN  hypertension; DM diabetes mellitus; COPD chronic obstructive pulmonary disease

## 2020-11-18 ENCOUNTER — Other Ambulatory Visit: Payer: Self-pay

## 2020-11-18 ENCOUNTER — Ambulatory Visit (INDEPENDENT_AMBULATORY_CARE_PROVIDER_SITE_OTHER): Payer: Medicare Other | Admitting: Ophthalmology

## 2020-11-18 ENCOUNTER — Encounter (INDEPENDENT_AMBULATORY_CARE_PROVIDER_SITE_OTHER): Payer: Self-pay | Admitting: Ophthalmology

## 2020-11-18 DIAGNOSIS — Z09 Encounter for follow-up examination after completed treatment for conditions other than malignant neoplasm: Secondary | ICD-10-CM | POA: Insufficient documentation

## 2020-11-18 DIAGNOSIS — H4321 Crystalline deposits in vitreous body, right eye: Secondary | ICD-10-CM | POA: Diagnosis not present

## 2020-11-18 DIAGNOSIS — E113591 Type 2 diabetes mellitus with proliferative diabetic retinopathy without macular edema, right eye: Secondary | ICD-10-CM | POA: Diagnosis not present

## 2020-11-18 DIAGNOSIS — H43821 Vitreomacular adhesion, right eye: Secondary | ICD-10-CM

## 2020-11-18 NOTE — Progress Notes (Signed)
11/18/2020     CHIEF COMPLAINT Patient presents for Post-op Follow-up (1 Week s\p vitrectomy, PRP, release of vitreal macular traction OD. OCT/Pt denies pain/discomfort. Using gtts as directed.)   HISTORY OF PRESENT ILLNESS: [name] Mathews is a 87 y.o. female who presents to the clinic today for:   HPI    Post-op Follow-up    In right eye.  Discomfort includes none.  Vision is stable.  I, the attending physician,  performed the HPI with the patient and updated documentation appropriately. Additional comments: 1 Week s\p vitrectomy, PRP, release of vitreal macular traction OD. OCT Pt denies pain/discomfort. Using gtts as directed.       Last edited by Edmon Crape, MD on 11/18/2020 10:52 AM. (History)      Referring physician: Rodrigo Ran, MD [address] [place],  [place] [address]  HISTORICAL INFORMATION:   Selected notes from the MEDICAL RECORD NUMBER    Lab Results  Component Value Date   HGBA1C 6.5 (H) 06/06/2019     CURRENT MEDICATIONS: Current Outpatient Medications (Ophthalmic Drugs)  Medication Sig  . ofloxacin (OCUFLOX) 0.3 % ophthalmic solution Place 1 drop into the right eye 4 (four) times daily for 21 days.  . prednisoLONE acetate (PRED FORTE) 1 % ophthalmic suspension Place 1 drop into the right eye 4 (four) times daily for 21 days.   No current facility-administered medications for this visit. (Ophthalmic Drugs)   Current Outpatient Medications (Other)  Medication Sig  . acetaminophen (TYLENOL) 325 MG tablet Take 2 tablets (650 mg total) by mouth every 4 (four) hours as needed for mild pain ((score 1 to 3) or temp > 100.5).  Marland Kitchen amLODipine (NORVASC) 10 MG tablet Take 1 tablet by mouth once.  Marland Kitchen amLODipine (NORVASC) 5 MG tablet Take 1 tablet (5 mg total) by mouth daily.  Marland Kitchen aspirin 81 MG tablet Take 81 mg by mouth daily.  . Bacillus Coagulans-Inulin (ALIGN PREBIOTIC-PROBIOTIC PO) Take by mouth.  . cefdinir (OMNICEF) 300 MG capsule Take 300 mg by mouth 2  (two) times daily.  . Cholecalciferol (VITAMIN D3) 125 MCG (5000 UT) TABS Take 1 tablet (5,000 Units total) by mouth daily.  . cyclobenzaprine (FLEXERIL) 10 MG tablet Take 1 tablet (10 mg total) by mouth 3 (three) times daily as needed for muscle spasms.  Marland Kitchen docusate sodium (COLACE) 250 MG capsule Take 250 mg by mouth daily.  . Ferrous Sulfate (SLOW RELEASE IRON PO) Take 1 tablet by mouth daily.  . fluticasone (FLONASE) 50 MCG/ACT nasal spray Place 1 spray into both nostrils daily as needed for allergies or rhinitis.  . furosemide (LASIX) 20 MG tablet Take 20 mg by mouth daily.  Marland Kitchen galantamine (RAZADYNE ER) 16 MG 24 hr capsule Take 16 mg by mouth every evening.  . hydrALAZINE (APRESOLINE) 25 MG tablet Take 25 mg by mouth 3 (three) times daily.  . Lactobacillus-Inulin (PROBIOTIC DIGESTIVE SUPPORT PO) Take 2 each by mouth every morning.  . Lancets (ONETOUCH DELICA PLUS LANCET33G) MISC Apply 1 each topically daily.  Marland Kitchen linaclotide (LINZESS) 145 MCG CAPS capsule Take 145 mcg by mouth every other day.  . Multiple Vitamins-Minerals (MULTIVITAMIN PO) Take 2 each by mouth daily.   Marland Kitchen MYRBETRIQ 50 MG TB24 tablet Take 50 mg by mouth daily.  . nitrofurantoin, macrocrystal-monohydrate, (MACROBID) 100 MG capsule Take 100 mg by mouth 2 (two) times daily. For 14 days  . ONETOUCH VERIO test strip USE 1 TEST STRIP TO CHECK BLOOD GLUCOSE ONCE A DAY DX  E11.9  . pioglitazone (ACTOS) 30 MG tablet Take 1 tablet (30 mg total) by mouth daily.  . rosuvastatin (CRESTOR) 5 MG tablet Take 1 tablet (5 mg total) by mouth daily.  Marland Kitchen telmisartan (MICARDIS) 40 MG tablet Take 40 mg by mouth daily.  Marland Kitchen telmisartan (MICARDIS) 80 MG tablet Take 80 mg by mouth daily.  . vitamin B-12 (CYANOCOBALAMIN) 1000 MCG tablet Take 1,000 mcg by mouth daily. OTC  . vitamin C (ASCORBIC ACID) 500 MG tablet Take 500 mg by mouth daily.   No current facility-administered medications for this visit. (Other)      REVIEW OF  SYSTEMS:    ALLERGIES Allergies  Allergen Reactions  . Januvia [Sitagliptin] Other (See Comments)    Numbness in mouth  . Simvastatin Other (See Comments)    tremors    PAST MEDICAL HISTORY Past Medical History:  Diagnosis Date  . Anemia    hx of  . Arthritis   . Breast lump    hx of  . Cataract    left eye  . Chronic back pain   . Diabetes mellitus   . Hyperlipidemia   . Hypertension    sees Dr. Margret Chance, [facility] internal medicine  . Normochromic normocytic anemia 05/22/2017   Hb 10 MCV 85.6 No monoclonal spike on SPEP  Creatinine 1.3-1.6  . Peripheral vascular disease [facility])    Past Surgical History:  Procedure Laterality Date  . APPENDECTOMY    . BACK SURGERY    . BREAST CYST EXCISION Left   . BREAST SURGERY     cyst removed left side  . BUNIONECTOMY    . CARDIOVASCULAR STRESS TEST    . COLONOSCOPY    . DILATION AND CURETTAGE OF UTERUS    . TONSILLECTOMY      FAMILY HISTORY History reviewed. No pertinent family history.  SOCIAL HISTORY Social History   Tobacco Use  . Smoking status: Never Smoker  . Smokeless tobacco: Never Used  Vaping Use  . Vaping Use: Never used  Substance Use Topics  . Alcohol use: No  . Drug use: No         OPHTHALMIC EXAM: Base Eye Exam    Visual Acuity (Snellen - Linear)      Right Left   Dist cc 20/30 -2 20/25   Correction: Glasses       Tonometry (Tonopen, 10:01 AM)      Right Left   Pressure 12 16       Pupils      Pupils Dark Light Shape React APD   Right PERRL 2 2 Round Minimal None   Left PERRL 2  Round Minimal None       Neuro/Psych    Oriented x3: Yes   Mood/Affect: Normal       Dilation    Right eye: 1.0% Mydriacyl, 2.5% Phenylephrine @ 10:02 AM        Slit Lamp and Fundus Exam    External Exam      Right Left   External Normal Normal       Slit Lamp Exam      Right Left   Lids/Lashes Normal Normal   Conjunctiva/Sclera White and quiet White and quiet   Cornea Clear Clear    Anterior Chamber Deep and quiet Deep and quiet   Iris Round and reactive Round and reactive   Lens Centered posterior chamber intraocular lens 2+ Nuclear sclerosis   Anterior Vitreous Normal clear Normal  Fundus Exam      Right Left   Posterior Vitreous Clear, vitrectomized vastly improved    Disc Normal    C/D Ratio 0.5    Macula Macular thickening, Microaneurysms, Mild clinically significant macular edema    Vessels PDR-quiet    Periphery Good PRP peripheral anterior           IMAGING AND PROCEDURES  Imaging and Procedures for 11/18/20  OCT, Retina - OU - Both Eyes       Right Eye Quality was good. Scan locations included subfoveal. Central Foveal Thickness: 339. Progression has improved. Findings include abnormal foveal contour.   Left Eye Quality was good. Scan locations included subfoveal. Central Foveal Thickness: 265. Findings include normal foveal contour.   Notes Resolved vitreal macular traction, with much smaller cystoid macular edema and CSME 1 week post vitrectomy release of VMA and delivery of PRP for PDR                ASSESSMENT/PLAN:  Postoperative follow-up No lifting and bending for 1 week. No water in the eye for 10 days. Do not rub the eye. Wear shield at night for 1-3 days.  Wear your CPAP as normal, if instructed by your doctor.  Continue your topical medications for a total of 3 weeks.  Do not refill your postoperative medications unless instructed.  Complete medications over the next 2 weeks,  Asteroid hyalosis of right eye Resolved 1 week post vitrectomy, clearance of medial opacity  Vitreomacular adhesion of right eye Resolved 1 week post vitrectomy, with improved secondary CME and CSME      ICD-10-CM   1. Proliferative diabetic retinopathy of right eye without macular edema associated with type 2 diabetes mellitus (HCC)  E11.3591 OCT, Retina - OU - Both Eyes  2. Postoperative follow-up  Z09   3. Asteroid hyalosis of right  eye  H43.21   4. Vitreomacular adhesion of right eye  H43.821     1.  OD vastly improved with clear media, removal of asteroid in addition to improved macular anatomy after release of vitreal macular adhesion and now much less CME and CSME 1 week post vitrectomy PRP  2.  3.  Ophthalmic Meds Ordered this visit:  No orders of the defined types were placed in this encounter.      Return in about 2 months (around 01/16/2021) for DILATE OU, OCT, POST OP, OD.  There are no Patient Instructions on file for this visit.   Explained the diagnoses, plan, and follow up with the patient and they expressed understanding.  Patient expressed understanding of the importance of proper follow up care.   Alford Highland Steven Veazie M.D. Diseases & Surgery of the Retina and Vitreous [facility] 11/18/20     Abbreviations: M myopia (nearsighted); A astigmatism; H hyperopia (farsighted); P presbyopia; Mrx spectacle prescription;  CTL contact lenses; OD right eye; OS left eye; OU both eyes  XT exotropia; ET esotropia; PEK punctate epithelial keratitis; PEE punctate epithelial erosions; DES dry eye syndrome; MGD meibomian gland dysfunction; ATs artificial tears; PFAT's preservative free artificial tears; NSC nuclear sclerotic cataract; PSC posterior subcapsular cataract; ERM epi-retinal membrane; PVD posterior vitreous detachment; RD retinal detachment; DM diabetes mellitus; DR diabetic retinopathy; NPDR non-proliferative diabetic retinopathy; PDR proliferative diabetic retinopathy; CSME clinically significant macular edema; DME diabetic macular edema; dbh dot blot hemorrhages; CWS cotton wool spot; POAG primary open angle glaucoma; C/D cup-to-disc ratio; HVF humphrey visual field; GVF goldmann visual field; OCT optical coherence  tomography; IOP intraocular pressure; BRVO Branch retinal vein occlusion; CRVO central retinal vein occlusion; CRAO central retinal artery occlusion; BRAO branch retinal artery  occlusion; RT retinal tear; SB scleral buckle; PPV pars plana vitrectomy; VH Vitreous hemorrhage; PRP panretinal laser photocoagulation; IVK intravitreal kenalog; VMT vitreomacular traction; MH Macular hole;  NVD neovascularization of the disc; NVE neovascularization elsewhere; AREDS age related eye disease study; ARMD age related macular degeneration; POAG primary open angle glaucoma; EBMD epithelial/anterior basement membrane dystrophy; ACIOL anterior chamber intraocular lens; IOL intraocular lens; PCIOL posterior chamber intraocular lens; Phaco/IOL phacoemulsification with intraocular lens placement; [place] photorefractive keratectomy; LASIK laser assisted in situ keratomileusis; HTN hypertension; DM diabetes mellitus; COPD chronic obstructive pulmonary disease

## 2020-11-18 NOTE — Assessment & Plan Note (Signed)
No lifting and bending for 1 week. No water in the eye for 10 days. Do not rub the eye. Wear shield at night for 1-3 days.  Wear your CPAP as normal, if instructed by your doctor.  Continue your topical medications for a total of 3 weeks.  Do not refill your postoperative medications unless instructed.  Complete medications over the next 2 weeks,

## 2020-11-18 NOTE — Assessment & Plan Note (Signed)
Resolved 1 week post vitrectomy, with improved secondary CME and CSME

## 2020-11-18 NOTE — Assessment & Plan Note (Signed)
Resolved 1 week post vitrectomy, clearance of medial opacity

## 2020-11-19 ENCOUNTER — Ambulatory Visit
Admission: RE | Admit: 2020-11-19 | Discharge: 2020-11-19 | Disposition: A | Discharge status: Home / Self Care | Payer: Medicare Other | Source: Ambulatory Visit | Attending: Internal Medicine | Admitting: Internal Medicine

## 2020-11-19 DIAGNOSIS — Z1231 Encounter for screening mammogram for malignant neoplasm of breast: Secondary | ICD-10-CM

## 2020-11-23 DIAGNOSIS — E1351 Other specified diabetes mellitus with diabetic peripheral angiopathy without gangrene: Secondary | ICD-10-CM | POA: Diagnosis not present

## 2020-11-23 DIAGNOSIS — L602 Onychogryphosis: Secondary | ICD-10-CM | POA: Diagnosis not present

## 2020-12-03 ENCOUNTER — Emergency Department (HOSPITAL_BASED_OUTPATIENT_CLINIC_OR_DEPARTMENT_OTHER)
Admission: EM | Admit: 2020-12-03 | Discharge: 2020-12-03 | Disposition: A | Discharge status: Home / Self Care | Payer: Medicare Other | Attending: Emergency Medicine | Admitting: Emergency Medicine

## 2020-12-03 ENCOUNTER — Other Ambulatory Visit: Payer: Self-pay

## 2020-12-03 ENCOUNTER — Encounter (HOSPITAL_BASED_OUTPATIENT_CLINIC_OR_DEPARTMENT_OTHER): Payer: Self-pay | Admitting: *Deleted

## 2020-12-03 DIAGNOSIS — Z7984 Long term (current) use of oral hypoglycemic drugs: Secondary | ICD-10-CM | POA: Diagnosis not present

## 2020-12-03 DIAGNOSIS — N3000 Acute cystitis without hematuria: Secondary | ICD-10-CM | POA: Insufficient documentation

## 2020-12-03 DIAGNOSIS — R5383 Other fatigue: Secondary | ICD-10-CM

## 2020-12-03 DIAGNOSIS — Z79899 Other long term (current) drug therapy: Secondary | ICD-10-CM | POA: Insufficient documentation

## 2020-12-03 DIAGNOSIS — Z20822 Contact with and (suspected) exposure to covid-19: Secondary | ICD-10-CM

## 2020-12-03 DIAGNOSIS — N182 Chronic kidney disease, stage 2 (mild): Secondary | ICD-10-CM | POA: Diagnosis not present

## 2020-12-03 DIAGNOSIS — I129 Hypertensive chronic kidney disease with stage 1 through stage 4 chronic kidney disease, or unspecified chronic kidney disease: Secondary | ICD-10-CM | POA: Insufficient documentation

## 2020-12-03 DIAGNOSIS — B349 Viral infection, unspecified: Secondary | ICD-10-CM | POA: Insufficient documentation

## 2020-12-03 DIAGNOSIS — U071 COVID-19: Secondary | ICD-10-CM | POA: Insufficient documentation

## 2020-12-03 DIAGNOSIS — Z7982 Long term (current) use of aspirin: Secondary | ICD-10-CM | POA: Insufficient documentation

## 2020-12-03 DIAGNOSIS — E1122 Type 2 diabetes mellitus with diabetic chronic kidney disease: Secondary | ICD-10-CM | POA: Insufficient documentation

## 2020-12-03 LAB — URINALYSIS, ROUTINE W REFLEX MICROSCOPIC
Bilirubin Urine: NEGATIVE
Glucose, UA: NEGATIVE mg/dL
Hgb urine dipstick: NEGATIVE
Ketones, ur: NEGATIVE mg/dL
Nitrite: NEGATIVE
Protein, ur: 30 mg/dL — AB
Specific Gravity, Urine: 1.01 (ref 1.005–1.030)
pH: 5 (ref 5.0–8.0)

## 2020-12-03 LAB — LACTIC ACID, PLASMA: Lactic Acid, Venous: 0.9 mmol/L (ref 0.5–1.9)

## 2020-12-03 LAB — CBC WITH DIFFERENTIAL/PLATELET
Abs Immature Granulocytes: 0.01 10*3/uL (ref 0.00–0.07)
Basophils Absolute: 0 10*3/uL (ref 0.0–0.1)
Basophils Relative: 1 %
Eosinophils Absolute: 0.1 10*3/uL (ref 0.0–0.5)
Eosinophils Relative: 2 %
HCT: 33.3 % — ABNORMAL LOW (ref 36.0–46.0)
Hemoglobin: 10.8 g/dL — ABNORMAL LOW (ref 12.0–15.0)
Immature Granulocytes: 0 %
Lymphocytes Relative: 31 %
Lymphs Abs: 1.4 10*3/uL (ref 0.7–4.0)
MCH: 29.4 pg (ref 26.0–34.0)
MCHC: 32.4 g/dL (ref 30.0–36.0)
MCV: 90.7 fL (ref 80.0–100.0)
Monocytes Absolute: 0.6 10*3/uL (ref 0.1–1.0)
Monocytes Relative: 13 %
Neutro Abs: 2.4 10*3/uL (ref 1.7–7.7)
Neutrophils Relative %: 53 %
Platelets: 168 10*3/uL (ref 150–400)
RBC: 3.67 MIL/uL — ABNORMAL LOW (ref 3.87–5.11)
RDW: 14.4 % (ref 11.5–15.5)
WBC: 4.6 10*3/uL (ref 4.0–10.5)
nRBC: 0 % (ref 0.0–0.2)

## 2020-12-03 LAB — COMPREHENSIVE METABOLIC PANEL
ALT: 10 U/L (ref 0–44)
AST: 14 U/L — ABNORMAL LOW (ref 15–41)
Albumin: 4.1 g/dL (ref 3.5–5.0)
Alkaline Phosphatase: 95 U/L (ref 38–126)
Anion gap: 11 (ref 5–15)
BUN: 21 mg/dL (ref 8–23)
CO2: 23 mmol/L (ref 22–32)
Calcium: 8.7 mg/dL — ABNORMAL LOW (ref 8.9–10.3)
Chloride: 97 mmol/L — ABNORMAL LOW (ref 98–111)
Creatinine, Ser: 1.5 mg/dL — ABNORMAL HIGH (ref 0.44–1.00)
GFR, Estimated: 34 mL/min — ABNORMAL LOW (ref >60)
Glucose, Bld: 134 mg/dL — ABNORMAL HIGH (ref 70–99)
Potassium: 4.3 mmol/L (ref 3.5–5.1)
Sodium: 131 mmol/L — ABNORMAL LOW (ref 135–145)
Total Bilirubin: 0.5 mg/dL (ref 0.3–1.2)
Total Protein: 7.7 g/dL (ref 6.5–8.1)

## 2020-12-03 LAB — URINALYSIS, MICROSCOPIC (REFLEX): RBC / HPF: NONE SEEN RBC/hpf (ref 0–5)

## 2020-12-03 LAB — SARS CORONAVIRUS 2 (TAT 6-24 HRS): SARS Coronavirus 2: POSITIVE — AB

## 2020-12-03 MED ORDER — SODIUM CHLORIDE 0.9 % IV BOLUS
1000.0000 mL | Freq: Once | INTRAVENOUS | Status: AC
  Administered 2020-12-03: 1000 mL via INTRAVENOUS

## 2020-12-03 NOTE — ED Notes (Signed)
Sent Pt. To restroom with no result.  Pt. To room 2 with [facility] getting her a BSC to obtain poss. Urine.

## 2020-12-03 NOTE — ED Notes (Signed)
Pt alert to self and to year but not location. Pt follows commands and is not in distress

## 2020-12-03 NOTE — Discharge Instructions (Signed)
Please continue to monitor your symptoms.  Her Covid test is pending at this time. Your work-up today was reassuring.  Please continue taking antibiotics as prescribed for entire course.  Please follow-up with your primary care doctor.  Please drink plenty of water.

## 2020-12-03 NOTE — ED Notes (Signed)
ED Provider at bedside. 

## 2020-12-03 NOTE — ED Triage Notes (Signed)
She was started on an antibiotic 2 weeks ago for a UTI. No improvement and yesterday she started on a second antibiotic. She has had 2 doses. She feels weak. Alert and oriented.

## 2020-12-03 NOTE — ED Provider Notes (Addendum)
[facility] EMERGENCY DEPARTMENT Provider Note   CSN: [id number] Arrival date & time: 12/03/20  1313     History Chief Complaint  Patient presents with  . Weakness  . Urinary Tract Infection    [name] Mathews is a 87 y.o. female  HPI  Patient is an 87 year old female presenting today with fatigue, some congestion and concern for urinary tract infection.  She was started on Keflex by her PCP 2 weeks ago.  She states that she finishes antibiotics after taken as prescribed however was told that she needed to switch to [facility] after urine culture showed this to be sensitive.  She has only taken 2 doses so far this.  When her PCP office was informed that she was feeling fatigued she was directed to the ER for concerns for urosepsis.  She states that she has had no fevers, chills, chest pain, abdominal pain, back pain.  Patient states that her symptoms are mild but persistent.  Daughter patient is with her and states that she is somewhat concerned that she may have Covid.  No other significant symptoms.  No aggravating factors.     Past Medical History:  Diagnosis Date  . Anemia    hx of  . Arthritis   . Breast lump    hx of  . Cataract    left eye  . Chronic back pain   . Diabetes mellitus   . Hyperlipidemia   . Hypertension    sees Dr. Margret Chance, [facility] internal medicine  . Normochromic normocytic anemia 05/22/2017   Hb 10 MCV 85.6 No monoclonal spike on SPEP  Creatinine 1.3-1.6  . Peripheral vascular disease [facility])     Patient Active Problem List   Diagnosis Date Noted  . Postoperative follow-up 11/18/2020  . Proliferative diabetic retinopathy of right eye (HCC) 02/03/2020  . Moderate nonproliferative diabetic retinopathy of left eye (HCC) 02/03/2020  . 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 Vitreomacular adhesion of right eye 02/03/2020  . Vitreomacular adhesion of left eye 02/03/2020  . Small vessel disease, cerebrovascular   .  Postoperative confusion   . Drug induced constipation   . CKD (chronic kidney disease), stage II   . Labile blood pressure   . Diabetes mellitus type 2 in obese (HCC)   . Labile blood glucose   . Acute on chronic anemia   . Lumbar radiculopathy 06/10/2019  . Lumbar adjacent segment disease with spondylolisthesis 06/05/2019  . Hypersomnia with long sleep time, idiopathic 01/17/2018  . Snoring 01/17/2018  . Sleep apnea with hypersomnolence 01/17/2018  . Excessive daytime sleepiness 01/17/2018  . Normochromic normocytic anemia 05/22/2017  . Radiculopathy 07/11/2012  . HTN (hypertension) 07/08/2012  . Diabetes mellitus (HCC) 07/08/2012  . Encephalopathy acute 07/08/2012  . Tachycardia 07/08/2012  . UTI (lower urinary tract infection) 07/08/2012    Past Surgical History:  Procedure Laterality Date  . APPENDECTOMY    . BACK SURGERY    . BREAST CYST EXCISION Left   . BREAST SURGERY     cyst removed left side  . BUNIONECTOMY    . CARDIOVASCULAR STRESS TEST    . COLONOSCOPY    . DILATION AND CURETTAGE OF UTERUS    . TONSILLECTOMY       OB History   No obstetric history on file.     No family history on file.  Social History   Tobacco Use  . Smoking status: Never Smoker  . Smokeless tobacco: Never Used  Vaping Use  .  Vaping Use: Never used  Substance Use Topics  . Alcohol use: No  . Drug use: No    Home Medications Prior to Admission medications   Medication Sig Start Date End Date Taking? Authorizing Provider  acetaminophen (TYLENOL) 325 MG tablet Take 2 tablets (650 mg total) by mouth every 4 (four) hours as needed for mild pain ((score 1 to 3) or temp > 100.5). 06/28/19   Angiulli, Mcarthur Rossetti, PA-C  amLODipine (NORVASC) 10 MG tablet Take 1 tablet by mouth once. 11/15/19   [provider]  amLODipine (NORVASC) 5 MG tablet Take 1 tablet (5 mg total) by mouth daily. 06/28/19   Angiulli, Mcarthur Rossetti, PA-C  aspirin 81 MG tablet Take 81 mg by mouth daily.    [provider]  Bacillus Coagulans-Inulin (ALIGN PREBIOTIC-PROBIOTIC PO) Take by mouth.    [provider]  cefdinir (OMNICEF) 300 MG capsule Take 300 mg by mouth 2 (two) times daily. 01/13/20   [provider]  Cholecalciferol (VITAMIN D3) 125 MCG (5000 UT) TABS Take 1 tablet (5,000 Units total) by mouth daily. 06/28/19   Angiulli, Mcarthur Rossetti, PA-C  cyclobenzaprine (FLEXERIL) 10 MG tablet Take 1 tablet (10 mg total) by mouth 3 (three) times daily as needed for muscle spasms. 06/28/19   Angiulli, Mcarthur Rossetti, PA-C  docusate sodium (COLACE) 250 MG capsule Take 250 mg by mouth daily.    [provider]  Ferrous Sulfate (SLOW RELEASE IRON PO) Take 1 tablet by mouth daily.    [provider]  fluticasone (FLONASE) 50 MCG/ACT nasal spray Place 1 spray into both nostrils daily as needed for allergies or rhinitis.    [provider]  furosemide (LASIX) 20 MG tablet Take 20 mg by mouth daily. 01/09/20   [provider]  galantamine (RAZADYNE ER) 16 MG 24 hr capsule Take 16 mg by mouth every evening.    [provider]  hydrALAZINE (APRESOLINE) 25 MG tablet Take 25 mg by mouth 3 (three) times daily. 02/28/20   [provider]  Lactobacillus-Inulin (PROBIOTIC DIGESTIVE SUPPORT PO) Take 2 each by mouth every morning.    [provider]  Lancets [facility] DELICA PLUS [place]) MISC Apply 1 each topically daily. 12/25/19   [provider]  linaclotide (LINZESS) 145 MCG CAPS capsule Take 145 mcg by mouth every other day.    [provider]  Multiple Vitamins-Minerals (MULTIVITAMIN PO) Take 2 each by mouth daily.     [provider]  MYRBETRIQ 50 MG TB24 tablet Take 50 mg by mouth daily. 12/30/19   [provider]  nitrofurantoin, macrocrystal-monohydrate, (MACROBID) 100 MG capsule Take 100 mg by mouth 2 (two) times daily. For 14 days    [provider]  [facility] VERIO test strip USE 1 TEST STRIP TO  CHECK BLOOD GLUCOSE ONCE A DAY DX  E11.9 12/25/19   [provider]  pioglitazone (ACTOS) 30 MG tablet Take 1 tablet (30 mg total) by mouth daily. 06/28/19   Angiulli, Mcarthur Rossetti, PA-C  rosuvastatin (CRESTOR) 5 MG tablet Take 1 tablet (5 mg total) by mouth daily. 06/28/19   Angiulli, Mcarthur Rossetti, PA-C  telmisartan (MICARDIS) 40 MG tablet Take 40 mg by mouth daily. 11/21/19   [provider]  telmisartan (MICARDIS) 80 MG tablet Take 80 mg by mouth daily. 09/25/19   [provider]  vitamin B-12 (CYANOCOBALAMIN) 1000 MCG tablet Take 1,000 mcg by mouth daily. OTC    [provider]  vitamin C (ASCORBIC ACID)  500 MG tablet Take 500 mg by mouth daily.    [provider]    Allergies    Januvia [sitagliptin] and Simvastatin  Review of Systems   Review of Systems  Constitutional: Positive for fatigue. Negative for fever.  HENT: Negative for congestion.   Respiratory: Negative for shortness of breath.   Cardiovascular: Negative for chest pain.  Gastrointestinal: Negative for abdominal distention, diarrhea, nausea and vomiting.  Neurological: Negative for dizziness and headaches.    Physical Exam Updated Vital Signs BP (!) 172/70   Pulse 65   Temp 98.1 F (36.7 C) (Oral)   Resp 19   Ht 5' 8.5" (1.74 m)   Wt 96.2 kg   SpO2 98%   BMI 31.78 kg/m   Physical Exam Vitals and nursing note reviewed.  Constitutional:      General: She is not in acute distress.    Comments: Pleasant well-appearing 87 year old.  In no acute distress.  Sitting comfortably in bed.  Able answer questions appropriately follow commands. No increased work of breathing. Speaking in full sentences.  HENT:     Head: Normocephalic and atraumatic.     Nose: Nose normal.     Mouth/Throat:     Mouth: Mucous membranes are moist.  Eyes:     General: No scleral icterus. Cardiovascular:     Rate and Rhythm: Normal rate and regular rhythm.     Pulses: Normal pulses.     Heart sounds:  Normal heart sounds.  Pulmonary:     Effort: Pulmonary effort is normal. No respiratory distress.     Breath sounds: No wheezing.  Abdominal:     Palpations: Abdomen is soft.     Tenderness: There is no abdominal tenderness. There is no right CVA tenderness, left CVA tenderness, guarding or rebound.  Musculoskeletal:     Cervical back: Normal range of motion.     Right lower leg: No edema.     Left lower leg: No edema.     Comments: Moves all 4 extremities has good strength in all 4 extremities.  Skin:    General: Skin is warm and dry.     Capillary Refill: Capillary refill takes less than 2 seconds.  Neurological:     Mental Status: She is alert. Mental status is at baseline.     Comments: Moves all 4 extremities: Sensation intact in all 4 extremities  Psychiatric:        Mood and Affect: Mood normal.        Behavior: Behavior normal.     ED Results / Procedures / Treatments   Labs (all labs ordered are listed, but only abnormal results are displayed) Labs Reviewed  COMPREHENSIVE METABOLIC PANEL - Abnormal; Notable for the following components:      Result Value   Sodium 131 (*)    Chloride 97 (*)    Glucose, Bld 134 (*)    Creatinine, Ser 1.50 (*)    Calcium 8.7 (*)    AST 14 (*)    GFR, Estimated 34 (*)    All other components within normal limits  CBC WITH DIFFERENTIAL/PLATELET - Abnormal; Notable for the following components:   RBC 3.67 (*)    Hemoglobin 10.8 (*)    HCT 33.3 (*)    All other components within normal limits  URINALYSIS, ROUTINE W REFLEX MICROSCOPIC - Abnormal; Notable for the following components:   Protein, ur 30 (*)    Leukocytes,Ua TRACE (*)    All other components within  normal limits  URINALYSIS, MICROSCOPIC (REFLEX) - Abnormal; Notable for the following components:   Bacteria, UA MANY (*)    All other components within normal limits  URINE CULTURE  SARS CORONAVIRUS 2 (TAT 6-24 HRS)  LACTIC ACID, PLASMA  LACTIC ACID, PLASMA     EKG None  Radiology No results found.  Procedures Procedures   Medications Ordered in ED Medications  sodium chloride 0.9 % bolus 1,000 mL (0 mLs Intravenous Stopped 12/03/20 1832)    ED Course  I have reviewed the triage vital signs and the nursing notes.  Pertinent labs & imaging results that were available during my care of the patient were reviewed by me and considered in my medical decision making (see chart for details).    MDM Rules/Calculators/A&P                          Patient is 87 year old female with past medical history detailed in HPI presented today for some fatigue and cold-like symptoms.  She has also had continued dysuria she has just recently been switched from Keflex to [facility] after urine culture is resulted.  Her PCP is monitoring her for this.  Over the phone there was some concern for urosepsis therefore patient was brought to the ER for evaluation.  She is not tachycardic, she is afebrile, no tachypnea, no leukocytosis.  Clinically patient is very well-appearing.  She has no abdominal tenderness her lungs are clear to auscultation all fields.  As patient is objectively not septic we will follow-up on blood work and urine ordered in triage.  Anticipate discharge with continued care by PCP.  We will obtain Covid test given her cold-like symptoms.   I personally reviewed all laboratory work and imaging.  Metabolic panel without any acute abnormality specifically kidney function within normal limits and no significant electrolyte abnormalities.  CBC without leukocytosis or significant anemia.  Her lab work is overall within normal limits when compared to her baseline lab work.  Lactic within normal limits/0.9  Urinalysis with many bacteria and trace leukocytes she is on Macrobid currently.  Will follow up with PCP.  Urine culture sent.  I discussed this case with my attending physician who cosigned this note including patient's presenting symptoms,  physical exam, and planned diagnostics and interventions. Attending physician stated agreement with plan or made changes to plan which were implemented.   Patient and daughter aware of plan to discharge agreeable to plan.  Patient is received 1 L normal saline.  Has had good urine output here in the ER.  [name] Mathews was evaluated in Emergency Department on 12/03/2020 for the symptoms described in the history of present illness. She was evaluated in the context of the global COVID-19 pandemic, which necessitated consideration that the patient might be at risk for infection with the SARS-CoV-2 virus that causes COVID-19. Institutional protocols and algorithms that pertain to the evaluation of patients at risk for COVID-19 are in a state of rapid change based on information released by regulatory bodies including the CDC and federal and state organizations. These policies and algorithms were followed during the patient's care in the ED.  Final Clinical Impression(s) / ED Diagnoses Final diagnoses:  Other fatigue  Acute cystitis without hematuria  Suspected COVID-19 virus infection  Viral illness    Rx / DC Orders ED Discharge Orders    None       Gailen Shelter, [place] 12/03/20 2041    Solon Augusta  S, PA 12/03/20 2041    Rozelle Logan, DO 12/04/20 0021

## 2020-12-04 ENCOUNTER — Telehealth: Payer: Self-pay | Admitting: Physician Assistant

## 2020-12-04 NOTE — Telephone Encounter (Signed)
Called to discuss with patient about COVID-19 symptoms and the use of one of the available treatments for those with mild to moderate Covid symptoms and at a high risk of hospitalization.  Pt appears to qualify for outpatient treatment due to co-morbid conditions and/or a member of an at-risk group in accordance with the FDA Emergency Use Authorization.    Symptom onset: unclear from ER notes Vaccinated: unknown Booster? unknown Immunocompromised? no Qualifiers: age, DMT2, CVD, BMI, CKD  Unable to reach pt - left VM with PAO, [name].   [name] Mathews

## 2020-12-04 NOTE — Telephone Encounter (Signed)
Called to discuss with [name] Mathews about Covid symptoms and the use of sotrovimab, remdisivir or oral therapies for those with mild to moderate Covid symptoms and at a high risk of hospitalization.     Pt does not qualify as pt's symptoms first presented > 10 days prior to timing of infusion. Symptoms tier reviewed as well as criteria for ending isolation. Preventative practices reviewed. Patient verbalized understanding    Patient Active Problem List   Diagnosis Date Noted  . Postoperative follow-up 11/18/2020  . Proliferative diabetic retinopathy of right eye (HCC) 02/03/2020  . Moderate nonproliferative diabetic retinopathy of left eye (HCC) 02/03/2020  . 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 Vitreomacular adhesion of right eye 02/03/2020  . Vitreomacular adhesion of left eye 02/03/2020  . Small vessel disease, cerebrovascular   . Postoperative confusion   . Drug induced constipation   . CKD (chronic kidney disease), stage II   . Labile blood pressure   . Diabetes mellitus type 2 in obese (HCC)   . Labile blood glucose   . Acute on chronic anemia   . Lumbar radiculopathy 06/10/2019  . Lumbar adjacent segment disease with spondylolisthesis 06/05/2019  . Hypersomnia with long sleep time, idiopathic 01/17/2018  . Snoring 01/17/2018  . Sleep apnea with hypersomnolence 01/17/2018  . Excessive daytime sleepiness 01/17/2018  . Normochromic normocytic anemia 05/22/2017  . Radiculopathy 07/11/2012  . HTN (hypertension) 07/08/2012  . Diabetes mellitus (HCC) 07/08/2012  . Encephalopathy acute 07/08/2012  . Tachycardia 07/08/2012  . UTI (lower urinary tract infection) 07/08/2012    Cline Crock PA-C

## 2020-12-06 LAB — URINE CULTURE: Culture: >=100000 — AB

## 2020-12-07 NOTE — Progress Notes (Signed)
ED Antimicrobial Stewardship Positive Culture Follow Up   [name] Mathews is an 87 y.o. female who presented to [facility] on 09/12/2018 with a chief complaint of fatigue and cold-like symptoms.   Chief Complaint  Patient presents with  . Fall    Recent Results (from the past 720 hour(s))  SARS CORONAVIRUS 2 (TAT 6-24 HRS) Nasopharyngeal Nasopharyngeal Swab     Status: Abnormal   Collection Time: 12/03/20  3:49 PM   Specimen: Nasopharyngeal Swab  Result Value Ref Range Status   SARS Coronavirus 2 POSITIVE (A) NEGATIVE Final    Comment: (NOTE) SARS-CoV-2 target nucleic acids are DETECTED.  The SARS-CoV-2 RNA is generally detectable in upper and lower respiratory specimens during the acute phase of infection. Positive results are indicative of the presence of SARS-CoV-2 RNA. Clinical correlation with patient history and other diagnostic information is  necessary to determine patient infection status. Positive results do not rule out bacterial infection or co-infection with other viruses.  The expected result is Negative.  Fact Sheet for Patients: HairSlick.no  Fact Sheet for Healthcare Providers: quierodirigir.com  This test is not yet approved or cleared by the [place] FDA and  has been authorized for detection and/or diagnosis of SARS-CoV-2 by FDA under an Emergency Use Authorization (EUA). This EUA will remain  in effect (meaning this test can be used) for the duration of the COVID-19 declaration under Section 564(b)(1) of the Act, 21 U. S.C. section 360bbb-3(b)(1), unless the authorization is terminated or revoked sooner.   Performed at [facility], [address], [place], [place] [address]   Urine culture     Status: Abnormal   Collection Time: 12/03/20  5:21 PM   Specimen: Urine, Random  Result Value Ref Range Status   Specimen Description   Final    URINE, RANDOM Performed at [facility], [address] [place], [place] [address]    Special Requests   Final    NONE Performed at [facility], [address] [place], [place] [address]    Culture >=100,000 COLONIES/mL KLEBSIELLA PNEUMONIAE (A)  Final   Report Status 12/06/2020 FINAL  Final   Organism ID, Bacteria KLEBSIELLA PNEUMONIAE (A)  Final      Susceptibility   Klebsiella pneumoniae - MIC*    AMPICILLIN >=32 RESISTANT Resistant     CEFAZOLIN <=4 SENSITIVE Sensitive     CEFEPIME <=0.12 SENSITIVE Sensitive     CEFTRIAXONE <=0.25 SENSITIVE Sensitive     CIPROFLOXACIN <=0.25 SENSITIVE Sensitive     GENTAMICIN <=1 SENSITIVE Sensitive     IMIPENEM 0.5 SENSITIVE Sensitive     NITROFURANTOIN 64 INTERMEDIATE Intermediate     TRIMETH/SULFA <=20 SENSITIVE Sensitive     AMPICILLIN/SULBACTAM 8 SENSITIVE Sensitive     PIP/TAZO <=4 SENSITIVE Sensitive     * >=100,000 COLONIES/mL KLEBSIELLA PNEUMONIAE    [x]  Treated with Macrobid (nitrofurantoin), organism resistant to prescribed antimicrobial   New antibiotic prescription: STOP Macrobid; Start cephalexin 500 mg BID for 5 days   ED Provider: , PA-C    Loleta Dicker, PharmD-Candidate  12/07/2020, 9:45 AM Monday - Friday phone -  [phone] Saturday - Sunday phone - [phone]

## 2020-12-10 DIAGNOSIS — N390 Urinary tract infection, site not specified: Secondary | ICD-10-CM | POA: Diagnosis not present

## 2020-12-24 DIAGNOSIS — E785 Hyperlipidemia, unspecified: Secondary | ICD-10-CM | POA: Diagnosis not present

## 2020-12-24 DIAGNOSIS — E1169 Type 2 diabetes mellitus with other specified complication: Secondary | ICD-10-CM | POA: Diagnosis not present

## 2020-12-24 DIAGNOSIS — M859 Disorder of bone density and structure, unspecified: Secondary | ICD-10-CM | POA: Diagnosis not present

## 2020-12-28 DIAGNOSIS — E113293 Type 2 diabetes mellitus with mild nonproliferative diabetic retinopathy without macular edema, bilateral: Secondary | ICD-10-CM | POA: Diagnosis not present

## 2020-12-28 DIAGNOSIS — H2512 Age-related nuclear cataract, left eye: Secondary | ICD-10-CM | POA: Diagnosis not present

## 2020-12-28 DIAGNOSIS — H524 Presbyopia: Secondary | ICD-10-CM | POA: Diagnosis not present

## 2020-12-28 DIAGNOSIS — H40052 Ocular hypertension, left eye: Secondary | ICD-10-CM | POA: Diagnosis not present

## 2020-12-30 DIAGNOSIS — R82998 Other abnormal findings in urine: Secondary | ICD-10-CM | POA: Diagnosis not present

## 2021-01-08 DIAGNOSIS — M5030 Other cervical disc degeneration, unspecified cervical region: Secondary | ICD-10-CM | POA: Diagnosis not present

## 2021-01-08 DIAGNOSIS — M5136 Other intervertebral disc degeneration, lumbar region: Secondary | ICD-10-CM | POA: Diagnosis not present

## 2021-01-08 DIAGNOSIS — Z0000 Encounter for general adult medical examination without abnormal findings: Secondary | ICD-10-CM | POA: Diagnosis not present

## 2021-01-08 DIAGNOSIS — I129 Hypertensive chronic kidney disease with stage 1 through stage 4 chronic kidney disease, or unspecified chronic kidney disease: Secondary | ICD-10-CM | POA: Diagnosis not present

## 2021-01-08 DIAGNOSIS — R413 Other amnesia: Secondary | ICD-10-CM | POA: Diagnosis not present

## 2021-01-08 DIAGNOSIS — E785 Hyperlipidemia, unspecified: Secondary | ICD-10-CM | POA: Diagnosis not present

## 2021-01-08 DIAGNOSIS — N1832 Chronic kidney disease, stage 3b: Secondary | ICD-10-CM | POA: Diagnosis not present

## 2021-01-08 DIAGNOSIS — E1169 Type 2 diabetes mellitus with other specified complication: Secondary | ICD-10-CM | POA: Diagnosis not present

## 2021-01-08 DIAGNOSIS — M8589 Other specified disorders of bone density and structure, multiple sites: Secondary | ICD-10-CM | POA: Diagnosis not present

## 2021-01-08 DIAGNOSIS — Z1331 Encounter for screening for depression: Secondary | ICD-10-CM | POA: Diagnosis not present

## 2021-01-18 ENCOUNTER — Encounter (INDEPENDENT_AMBULATORY_CARE_PROVIDER_SITE_OTHER): Payer: Medicare Other | Admitting: Ophthalmology

## 2021-01-20 DIAGNOSIS — N183 Chronic kidney disease, stage 3 unspecified: Secondary | ICD-10-CM | POA: Diagnosis not present

## 2021-01-20 DIAGNOSIS — E1169 Type 2 diabetes mellitus with other specified complication: Secondary | ICD-10-CM | POA: Diagnosis not present

## 2021-01-21 ENCOUNTER — Encounter (INDEPENDENT_AMBULATORY_CARE_PROVIDER_SITE_OTHER): Payer: Self-pay | Admitting: Ophthalmology

## 2021-01-21 ENCOUNTER — Other Ambulatory Visit: Payer: Self-pay

## 2021-01-21 ENCOUNTER — Ambulatory Visit (INDEPENDENT_AMBULATORY_CARE_PROVIDER_SITE_OTHER): Payer: Medicare Other | Admitting: Ophthalmology

## 2021-01-21 DIAGNOSIS — H43821 Vitreomacular adhesion, right eye: Secondary | ICD-10-CM

## 2021-01-21 DIAGNOSIS — E113511 Type 2 diabetes mellitus with proliferative diabetic retinopathy with macular edema, right eye: Secondary | ICD-10-CM | POA: Diagnosis not present

## 2021-01-21 DIAGNOSIS — E113392 Type 2 diabetes mellitus with moderate nonproliferative diabetic retinopathy without macular edema, left eye: Secondary | ICD-10-CM

## 2021-01-21 DIAGNOSIS — H2512 Age-related nuclear cataract, left eye: Secondary | ICD-10-CM

## 2021-01-21 DIAGNOSIS — H4321 Crystalline deposits in vitreous body, right eye: Secondary | ICD-10-CM

## 2021-01-21 DIAGNOSIS — E113591 Type 2 diabetes mellitus with proliferative diabetic retinopathy without macular edema, right eye: Secondary | ICD-10-CM

## 2021-01-21 NOTE — Assessment & Plan Note (Signed)
Observe for now

## 2021-01-21 NOTE — Assessment & Plan Note (Signed)
No changes will observe

## 2021-01-21 NOTE — Assessment & Plan Note (Signed)
Minor macular thickening yet with good acuity we have the option to continue to observe.

## 2021-01-21 NOTE — Progress Notes (Signed)
01/21/2021     CHIEF COMPLAINT Patient presents for Post-op Follow-up (2 Month Post Op OD. OCT/Pt states OD vision is stable. Denies any complaints. Pt is not longer using gtts./BGL: 128 yesterday)   HISTORY OF PRESENT ILLNESS: [name] Mathews is a 87 y.o. female who presents to the clinic today for:   HPI    Post-op Follow-up    In right eye.  Discomfort includes none.  Vision is stable.  I, the attending physician,  performed the HPI with the patient and updated documentation appropriately. Additional comments: 2 Month Post Op OD. OCT Pt states OD vision is stable. Denies any complaints. Pt is not longer using gtts. BGL: 128 yesterday       Last edited by Elyse Jarvis on 01/21/2021  9:31 AM. (History)      Referring physician: Rodrigo Ran, MD [address] [place],  [place] [address]  HISTORICAL INFORMATION:   Selected notes from the MEDICAL RECORD NUMBER    Lab Results  Component Value Date   HGBA1C 6.5 (H) 06/06/2019     CURRENT MEDICATIONS: No current outpatient medications on file. (Ophthalmic Drugs)   No current facility-administered medications for this visit. (Ophthalmic Drugs)   Current Outpatient Medications (Other)  Medication Sig  . acetaminophen (TYLENOL) 325 MG tablet Take 2 tablets (650 mg total) by mouth every 4 (four) hours as needed for mild pain ((score 1 to 3) or temp > 100.5).  Marland Kitchen amLODipine (NORVASC) 10 MG tablet Take 1 tablet by mouth once.  Marland Kitchen amLODipine (NORVASC) 5 MG tablet Take 1 tablet (5 mg total) by mouth daily.  Marland Kitchen aspirin 81 MG tablet Take 81 mg by mouth daily.  . Bacillus Coagulans-Inulin (ALIGN PREBIOTIC-PROBIOTIC PO) Take by mouth.  . cefdinir (OMNICEF) 300 MG capsule Take 300 mg by mouth 2 (two) times daily.  . Cholecalciferol (VITAMIN D3) 125 MCG (5000 UT) TABS Take 1 tablet (5,000 Units total) by mouth daily.  . cyclobenzaprine (FLEXERIL) 10 MG tablet Take 1 tablet (10 mg total) by mouth 3 (three) times daily as needed for  muscle spasms.  Marland Kitchen docusate sodium (COLACE) 250 MG capsule Take 250 mg by mouth daily.  . Ferrous Sulfate (SLOW RELEASE IRON PO) Take 1 tablet by mouth daily.  . fluticasone (FLONASE) 50 MCG/ACT nasal spray Place 1 spray into both nostrils daily as needed for allergies or rhinitis.  . furosemide (LASIX) 20 MG tablet Take 20 mg by mouth daily.  Marland Kitchen galantamine (RAZADYNE ER) 16 MG 24 hr capsule Take 16 mg by mouth every evening.  . hydrALAZINE (APRESOLINE) 25 MG tablet Take 25 mg by mouth 3 (three) times daily.  . Lactobacillus-Inulin (PROBIOTIC DIGESTIVE SUPPORT PO) Take 2 each by mouth every morning.  . Lancets (ONETOUCH DELICA PLUS LANCET33G) MISC Apply 1 each topically daily.  Marland Kitchen linaclotide (LINZESS) 145 MCG CAPS capsule Take 145 mcg by mouth every other day.  . Multiple Vitamins-Minerals (MULTIVITAMIN PO) Take 2 each by mouth daily.   Marland Kitchen MYRBETRIQ 50 MG TB24 tablet Take 50 mg by mouth daily.  . nitrofurantoin, macrocrystal-monohydrate, (MACROBID) 100 MG capsule Take 100 mg by mouth 2 (two) times daily. For 14 days  . ONETOUCH VERIO test strip USE 1 TEST STRIP TO CHECK BLOOD GLUCOSE ONCE A DAY DX  E11.9  . pioglitazone (ACTOS) 30 MG tablet Take 1 tablet (30 mg total) by mouth daily.  . rosuvastatin (CRESTOR) 5 MG tablet Take 1 tablet (5 mg total) by mouth daily.  Marland Kitchen telmisartan (MICARDIS)  40 MG tablet Take 40 mg by mouth daily.  Marland Kitchen telmisartan (MICARDIS) 80 MG tablet Take 80 mg by mouth daily.  . vitamin B-12 (CYANOCOBALAMIN) 1000 MCG tablet Take 1,000 mcg by mouth daily. OTC  . vitamin C (ASCORBIC ACID) 500 MG tablet Take 500 mg by mouth daily.   No current facility-administered medications for this visit. (Other)      REVIEW OF SYSTEMS:    ALLERGIES Allergies  Allergen Reactions  . Januvia [Sitagliptin] Other (See Comments)    Numbness in mouth  . Simvastatin Other (See Comments)    tremors    PAST MEDICAL HISTORY Past Medical History:  Diagnosis Date  . Anemia    hx of  .  Arthritis   . Breast lump    hx of  . Cataract    left eye  . Chronic back pain   . Diabetes mellitus   . Hyperlipidemia   . Hypertension    sees Dr. Margret Chance, [facility] internal medicine  . Normochromic normocytic anemia 05/22/2017   Hb 10 MCV 85.6 No monoclonal spike on SPEP  Creatinine 1.3-1.6  . Peripheral vascular disease [facility])    Past Surgical History:  Procedure Laterality Date  . APPENDECTOMY    . BACK SURGERY    . BREAST CYST EXCISION Left   . BREAST SURGERY     cyst removed left side  . BUNIONECTOMY    . CARDIOVASCULAR STRESS TEST    . COLONOSCOPY    . DILATION AND CURETTAGE OF UTERUS    . TONSILLECTOMY      FAMILY HISTORY History reviewed. No pertinent family history.  SOCIAL HISTORY Social History   Tobacco Use  . Smoking status: Never Smoker  . Smokeless tobacco: Never Used  Vaping Use  . Vaping Use: Never used  Substance Use Topics  . Alcohol use: No  . Drug use: No         OPHTHALMIC EXAM:  Base Eye Exam    Visual Acuity (Snellen - Linear)      Right Left   Dist [place] 20/25 +1 20/20 -2       Tonometry (Tonopen, 9:37 AM)      Right Left   Pressure 14 17       Pupils      Pupils Dark Light Shape React APD   Right PERRL 2 2 Round Minimal None   Left PERRL 2 2 Round Minimal None       Visual Fields (Counting fingers)      Left Right    Full Full       Neuro/Psych    Oriented x3: Yes   Mood/Affect: Normal       Dilation    Both eyes: 1.0% Mydriacyl, 2.5% Phenylephrine @ 9:37 AM        Slit Lamp and Fundus Exam    External Exam      Right Left   External Normal Normal       Slit Lamp Exam      Right Left   Lids/Lashes Normal Normal   Conjunctiva/Sclera White and quiet White and quiet   Cornea Clear Clear   Anterior Chamber Deep and quiet Deep and quiet   Iris Round and reactive Round and reactive   Lens Centered posterior chamber intraocular lens 2+ Nuclear sclerosis, 2+ Cortical cataract   Anterior Vitreous  Normal clear Normal       Fundus Exam      Right Left   Posterior Vitreous  Clear, vitrectomized vastly improved Posterior vitreous detachment   Disc Normal Normal   C/D Ratio 0.5 0.45   Macula Macular thickening, Microaneurysms, Mild clinically significant macular edema Microaneurysms   Vessels PDR-quiet    Periphery Good PRP peripheral anterior           IMAGING AND PROCEDURES  Imaging and Procedures for 01/21/21  OCT, Retina - OU - Both Eyes       Right Eye Quality was good. Scan locations included subfoveal. Central Foveal Thickness: 399. Progression has been stable. Findings include abnormal foveal contour.   Left Eye Quality was good. Scan locations included subfoveal. Central Foveal Thickness: 260. Progression has been stable.                 ASSESSMENT/PLAN:  Proliferative diabetic retinopathy of right eye (HCC) Minor macular thickening yet with good acuity we have the option to continue to observe.  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 Cataract, nuclear sclerotic, left eye  H25.12   2. Moderate nonproliferative diabetic retinopathy of left eye without macular edema associated with type 2 diabetes mellitus (HCC)  V42.5956   3. Vitreomacular adhesion of right eye  H43.821 OCT, Retina - OU - Both Eyes  4. Asteroid hyalosis of right eye  H43.21   5. Proliferative diabetic retinopathy of right eye without macular edema associated with type 2 diabetes mellitus (HCC)  E11.3591 OCT, Retina - OU - Both Eyes    1.  2.  3.  Ophthalmic Meds Ordered this visit:  No orders of the defined types were placed in this encounter.      Return in about 4 months (around 05/23/2021) for DILATE OU, OCT.  There are no  Patient Instructions on file for this visit.   Explained the diagnoses, plan, and follow up with the patient and they expressed understanding.  Patient expressed understanding of the importance of proper follow up care.   Alford Highland Rankin M.D. Diseases & Surgery of the Retina and Vitreous [facility] 01/21/21     Abbreviations: M myopia (nearsighted); A astigmatism; H hyperopia (farsighted); P presbyopia; Mrx spectacle prescription;  CTL contact lenses; OD right eye; OS left eye; OU both eyes  XT exotropia; ET esotropia; PEK punctate epithelial keratitis; PEE punctate epithelial erosions; DES dry eye syndrome; MGD meibomian gland dysfunction; ATs artificial tears; PFAT's preservative free artificial tears; NSC nuclear sclerotic cataract; PSC posterior subcapsular cataract; ERM epi-retinal membrane; PVD posterior vitreous detachment; RD retinal detachment; DM diabetes mellitus; DR diabetic retinopathy; NPDR non-proliferative diabetic retinopathy; PDR proliferative diabetic retinopathy; CSME clinically significant macular edema; DME diabetic macular edema; dbh dot blot hemorrhages; CWS cotton wool spot; POAG primary open angle glaucoma; C/D cup-to-disc ratio; HVF humphrey visual field; GVF goldmann visual field; OCT optical coherence tomography; IOP intraocular pressure; BRVO Branch retinal vein occlusion; CRVO central retinal vein occlusion; CRAO central retinal artery occlusion; BRAO branch retinal artery occlusion; RT retinal tear; SB scleral buckle; PPV pars plana vitrectomy; VH Vitreous hemorrhage; PRP panretinal laser photocoagulation; IVK intravitreal kenalog; VMT vitreomacular traction; MH Macular hole;  NVD neovascularization of the disc; NVE neovascularization elsewhere; AREDS age related eye disease study; ARMD age related macular degeneration; POAG primary open angle glaucoma; EBMD epithelial/anterior basement membrane dystrophy; ACIOL anterior  chamber intraocular lens; IOL  intraocular lens; PCIOL posterior chamber intraocular lens; Phaco/IOL phacoemulsification with intraocular lens placement; PRK photorefractive keratectomy; LASIK laser assisted in situ keratomileusis; HTN hypertension; DM diabetes mellitus; COPD chronic obstructive pulmonary disease

## 2021-01-21 NOTE — Assessment & Plan Note (Signed)
Resolved post vitrectomy 

## 2021-01-25 ENCOUNTER — Encounter (INDEPENDENT_AMBULATORY_CARE_PROVIDER_SITE_OTHER): Payer: Medicare Other | Admitting: Ophthalmology

## 2021-03-26 DIAGNOSIS — Z23 Encounter for immunization: Secondary | ICD-10-CM | POA: Diagnosis not present

## 2021-05-11 DIAGNOSIS — N390 Urinary tract infection, site not specified: Secondary | ICD-10-CM | POA: Diagnosis not present

## 2021-05-18 DIAGNOSIS — M8580 Other specified disorders of bone density and structure, unspecified site: Secondary | ICD-10-CM | POA: Diagnosis not present

## 2021-05-18 DIAGNOSIS — M5030 Other cervical disc degeneration, unspecified cervical region: Secondary | ICD-10-CM | POA: Diagnosis not present

## 2021-05-18 DIAGNOSIS — N3281 Overactive bladder: Secondary | ICD-10-CM | POA: Diagnosis not present

## 2021-05-18 DIAGNOSIS — R300 Dysuria: Secondary | ICD-10-CM | POA: Diagnosis not present

## 2021-05-18 DIAGNOSIS — E1169 Type 2 diabetes mellitus with other specified complication: Secondary | ICD-10-CM | POA: Diagnosis not present

## 2021-05-18 DIAGNOSIS — E785 Hyperlipidemia, unspecified: Secondary | ICD-10-CM | POA: Diagnosis not present

## 2021-05-18 DIAGNOSIS — I10 Essential (primary) hypertension: Secondary | ICD-10-CM | POA: Diagnosis not present

## 2021-05-18 DIAGNOSIS — D649 Anemia, unspecified: Secondary | ICD-10-CM | POA: Diagnosis not present

## 2021-05-18 DIAGNOSIS — R413 Other amnesia: Secondary | ICD-10-CM | POA: Diagnosis not present

## 2021-05-18 DIAGNOSIS — N1832 Chronic kidney disease, stage 3b: Secondary | ICD-10-CM | POA: Diagnosis not present

## 2021-05-18 DIAGNOSIS — M5136 Other intervertebral disc degeneration, lumbar region: Secondary | ICD-10-CM | POA: Diagnosis not present

## 2021-05-18 DIAGNOSIS — Z23 Encounter for immunization: Secondary | ICD-10-CM | POA: Diagnosis not present

## 2021-05-20 DIAGNOSIS — L602 Onychogryphosis: Secondary | ICD-10-CM | POA: Diagnosis not present

## 2021-05-20 DIAGNOSIS — E1351 Other specified diabetes mellitus with diabetic peripheral angiopathy without gangrene: Secondary | ICD-10-CM | POA: Diagnosis not present

## 2021-05-24 ENCOUNTER — Other Ambulatory Visit: Payer: Self-pay

## 2021-05-24 ENCOUNTER — Encounter (INDEPENDENT_AMBULATORY_CARE_PROVIDER_SITE_OTHER): Payer: Self-pay | Admitting: Ophthalmology

## 2021-05-24 ENCOUNTER — Ambulatory Visit (INDEPENDENT_AMBULATORY_CARE_PROVIDER_SITE_OTHER): Payer: Medicare Other | Admitting: Ophthalmology

## 2021-05-24 DIAGNOSIS — E113392 Type 2 diabetes mellitus with moderate nonproliferative diabetic retinopathy without macular edema, left eye: Secondary | ICD-10-CM

## 2021-05-24 DIAGNOSIS — E113551 Type 2 diabetes mellitus with stable proliferative diabetic retinopathy, right eye: Secondary | ICD-10-CM

## 2021-05-24 DIAGNOSIS — H2512 Age-related nuclear cataract, left eye: Secondary | ICD-10-CM

## 2021-05-24 DIAGNOSIS — E113591 Type 2 diabetes mellitus with proliferative diabetic retinopathy without macular edema, right eye: Secondary | ICD-10-CM | POA: Diagnosis not present

## 2021-05-24 NOTE — Assessment & Plan Note (Signed)
Moderate to borderline severe NPDR left eye, yet overall stable.  We will continue to observe

## 2021-05-24 NOTE — Progress Notes (Signed)
05/24/2021     CHIEF COMPLAINT Patient presents for Retina Follow Up (4 month fu OU and OCT/Pt states VA OU stable since last visit. Pt denies FOL, floaters, or ocular pain OU. /A1C:6.6/LBS:121)   HISTORY OF PRESENT ILLNESS: [name] Mathews is a 87 y.o. female who presents to the clinic today for:   HPI     Retina Follow Up           Diagnosis: Diabetic Retinopathy   Laterality: both eyes   Onset: 4 months ago   Severity: mild   Duration: 4 months   Course: stable   Comments: 4 month fu OU and OCT Pt states VA OU stable since last visit. Pt denies FOL, floaters, or ocular pain OU.  A1C:6.6 LBS:121       Last edited by Demetrios Loll, COA on 05/24/2021 10:30 AM.      Referring physician: Antony Contras, MD [address] [place],  [place] [address]  HISTORICAL INFORMATION:   Selected notes from the MEDICAL RECORD NUMBER    Lab Results  Component Value Date   HGBA1C 6.5 (H) 06/06/2019     CURRENT MEDICATIONS: No current outpatient medications on file. (Ophthalmic Drugs)   No current facility-administered medications for this visit. (Ophthalmic Drugs)   Current Outpatient Medications (Other)  Medication Sig   acetaminophen (TYLENOL) 325 MG tablet Take 2 tablets (650 mg total) by mouth every 4 (four) hours as needed for mild pain ((score 1 to 3) or temp > 100.5).   amLODipine (NORVASC) 10 MG tablet Take 1 tablet by mouth once.   amLODipine (NORVASC) 5 MG tablet Take 1 tablet (5 mg total) by mouth daily.   aspirin 81 MG tablet Take 81 mg by mouth daily.   Bacillus Coagulans-Inulin (ALIGN PREBIOTIC-PROBIOTIC PO) Take by mouth.   cefdinir (OMNICEF) 300 MG capsule Take 300 mg by mouth 2 (two) times daily.   Cholecalciferol (VITAMIN D3) 125 MCG (5000 UT) TABS Take 1 tablet (5,000 Units total) by mouth daily.   cyclobenzaprine (FLEXERIL) 10 MG tablet Take 1 tablet (10 mg total) by mouth 3 (three) times daily as needed for muscle spasms.   docusate sodium (COLACE) 250  MG capsule Take 250 mg by mouth daily.   Ferrous Sulfate (SLOW RELEASE IRON PO) Take 1 tablet by mouth daily.   fluticasone (FLONASE) 50 MCG/ACT nasal spray Place 1 spray into both nostrils daily as needed for allergies or rhinitis.   furosemide (LASIX) 20 MG tablet Take 20 mg by mouth daily.   galantamine (RAZADYNE ER) 16 MG 24 hr capsule Take 16 mg by mouth every evening.   hydrALAZINE (APRESOLINE) 25 MG tablet Take 25 mg by mouth 3 (three) times daily.   Lactobacillus-Inulin (PROBIOTIC DIGESTIVE SUPPORT PO) Take 2 each by mouth every morning.   Lancets (ONETOUCH DELICA PLUS LANCET33G) MISC Apply 1 each topically daily.   linaclotide (LINZESS) 145 MCG CAPS capsule Take 145 mcg by mouth every other day.   Multiple Vitamins-Minerals (MULTIVITAMIN PO) Take 2 each by mouth daily.    MYRBETRIQ 50 MG TB24 tablet Take 50 mg by mouth daily.   nitrofurantoin, macrocrystal-monohydrate, (MACROBID) 100 MG capsule Take 100 mg by mouth 2 (two) times daily. For 14 days   ONETOUCH VERIO test strip USE 1 TEST STRIP TO CHECK BLOOD GLUCOSE ONCE A DAY DX  E11.9   pioglitazone (ACTOS) 30 MG tablet Take 1 tablet (30 mg total) by mouth daily.   rosuvastatin (CRESTOR) 5 MG tablet Take  1 tablet (5 mg total) by mouth daily.   telmisartan (MICARDIS) 40 MG tablet Take 40 mg by mouth daily.   telmisartan (MICARDIS) 80 MG tablet Take 80 mg by mouth daily.   vitamin B-12 (CYANOCOBALAMIN) 1000 MCG tablet Take 1,000 mcg by mouth daily. OTC   vitamin C (ASCORBIC ACID) 500 MG tablet Take 500 mg by mouth daily.   No current facility-administered medications for this visit. (Other)      REVIEW OF SYSTEMS:    ALLERGIES Allergies  Allergen Reactions   Januvia [Sitagliptin] Other (See Comments)    Numbness in mouth   Simvastatin Other (See Comments)    tremors    PAST MEDICAL HISTORY Past Medical History:  Diagnosis Date   Anemia    hx of   Arthritis    Breast lump    hx of   Cataract    left eye   Chronic  back pain    Diabetes mellitus    Hyperlipidemia    Hypertension    sees Dr. Margret Chance, [facility] internal medicine   Normochromic normocytic anemia 05/22/2017   Hb 10 MCV 85.6 No monoclonal spike on SPEP  Creatinine 1.3-1.6   Peripheral vascular disease (HCC)    Past Surgical History:  Procedure Laterality Date   APPENDECTOMY     BACK SURGERY     BREAST CYST EXCISION Left    BREAST SURGERY     cyst removed left side   BUNIONECTOMY     CARDIOVASCULAR STRESS TEST     COLONOSCOPY     DILATION AND CURETTAGE OF UTERUS     TONSILLECTOMY      FAMILY HISTORY History reviewed. No pertinent family history.  SOCIAL HISTORY Social History   Tobacco Use   Smoking status: Never   Smokeless tobacco: Never  Vaping Use   Vaping Use: Never used  Substance Use Topics   Alcohol use: No   Drug use: No         OPHTHALMIC EXAM:  Base Eye Exam     Visual Acuity (ETDRS)       Right Left   Dist [place] 20/20 20/40 -1   Dist ph [place]  20/25 -1         Tonometry (Tonopen, 10:34 AM)       Right Left   Pressure 14 14         Pupils       Pupils Dark Light Shape React APD   Right PERRL 2 2 Round Minimal None   Left PERRL 2 2 Round Minimal None         Visual Fields (Counting fingers)       Left Right    Full Full         Extraocular Movement       Right Left    Full Full         Neuro/Psych     Oriented x3: Yes   Mood/Affect: Normal         Dilation     Both eyes: 1.0% Mydriacyl, 2.5% Phenylephrine @ 10:34 AM           Slit Lamp and Fundus Exam     External Exam       Right Left   External Normal Normal         Slit Lamp Exam       Right Left   Lids/Lashes Normal Normal   Conjunctiva/Sclera White and quiet White and quiet  Cornea Clear Clear   Anterior Chamber Deep and quiet Deep and quiet   Iris Round and reactive Round and reactive   Lens Centered posterior chamber intraocular lens 2+ Nuclear sclerosis, 2+ Cortical cataract    Anterior Vitreous Normal clear Normal         Fundus Exam       Right Left   Posterior Vitreous Clear, vitrectomized vastly improved Posterior vitreous detachment   Disc Normal Normal   C/D Ratio 0.5 0.45   Macula Macular thickening, Microaneurysms, Mild clinically significant macular edema Microaneurysms   Vessels PDR-quiet    Periphery Good PRP peripheral anterior             IMAGING AND PROCEDURES  Imaging and Procedures for 05/24/21  OCT, Retina - OU - Both Eyes       Right Eye Quality was good. Scan locations included subfoveal. Central Foveal Thickness: 383. Progression has been stable. Findings include abnormal foveal contour.   Left Eye Quality was good. Scan locations included subfoveal. Central Foveal Thickness: 260. Progression has been stable.   Notes Minor perifoveal CSME inferior to the fovea yet still overall center involved thickening continues to improve slowly.  Now 7 months post most recent injection into vegF.  Quiescent PDR with good PRP post vitrectomy.  OS with severe NPDR but no signs of CSME OS             ASSESSMENT/PLAN:  Cataract, nuclear sclerotic, left eye Follow-up Dr. Ranae PilaLiles regarding this issue as scheduled  Moderate nonproliferative diabetic retinopathy of left eye (HCC) Moderate to borderline severe NPDR left eye, yet overall stable.  We will continue to observe  Stable treated proliferative diabetic retinopathy of right eye determined by examination associated with type 2 diabetes mellitus (HCC) Quiescent PDR OD status post vitrectomy right eye, PRP     ICD-10-CM   1. Moderate nonproliferative diabetic retinopathy of left eye without macular edema associated with type 2 diabetes mellitus (HCC)  E11.3392 OCT, Retina - OU - Both Eyes    2. Proliferative diabetic retinopathy of right eye without macular edema associated with type 2 diabetes mellitus (HCC)  E11.3591 OCT, Retina - OU - Both Eyes    3. Cataract, nuclear  sclerotic, left eye  H25.12     4. Stable treated proliferative diabetic retinopathy of right eye determined by examination associated with type 2 diabetes mellitus (HCC)  G40.1027E11.3551       1.  Quiescent PDR OD with excellent visual acuity.  Minor CSME not center involved continues to slowly resolve inferior to the fovea OD will observe  2.  OS with moderate to borderline severe NPDR yet no progression and no active maculopathy will observe  3.  Moderate nuclear sclerotic cataract left eye follow-up with Dr. Antony ContrasGraham Lyles as scheduled  Ophthalmic Meds Ordered this visit:  No orders of the defined types were placed in this encounter.      Return in about 7 months (around 12/25/2021) for COLOR FP, OCT, DILATE OU.  There are no Patient Instructions on file for this visit.   Explained the diagnoses, plan, and follow up with the patient and they expressed understanding.  Patient expressed understanding of the importance of proper follow up care.   Alford HighlandGary A. Modesta Sammons M.D. Diseases & Surgery of the Retina and Vitreous [facility] 05/24/21     Abbreviations: M myopia (nearsighted); A astigmatism; H hyperopia (farsighted); P presbyopia; Mrx spectacle prescription;  CTL contact lenses; OD right eye;  OS left eye; OU both eyes  XT exotropia; ET esotropia; PEK punctate epithelial keratitis; PEE punctate epithelial erosions; DES dry eye syndrome; MGD meibomian gland dysfunction; ATs artificial tears; PFAT's preservative free artificial tears; NSC nuclear sclerotic cataract; PSC posterior subcapsular cataract; ERM epi-retinal membrane; PVD posterior vitreous detachment; RD retinal detachment; DM diabetes mellitus; DR diabetic retinopathy; NPDR non-proliferative diabetic retinopathy; PDR proliferative diabetic retinopathy; CSME clinically significant macular edema; DME diabetic macular edema; dbh dot blot hemorrhages; CWS cotton wool spot; POAG primary open angle glaucoma; C/D cup-to-disc  ratio; HVF humphrey visual field; GVF goldmann visual field; OCT optical coherence tomography; IOP intraocular pressure; BRVO Branch retinal vein occlusion; CRVO central retinal vein occlusion; CRAO central retinal artery occlusion; BRAO branch retinal artery occlusion; RT retinal tear; SB scleral buckle; PPV pars plana vitrectomy; VH Vitreous hemorrhage; PRP panretinal laser photocoagulation; IVK intravitreal kenalog; VMT vitreomacular traction; MH Macular hole;  NVD neovascularization of the disc; NVE neovascularization elsewhere; AREDS age related eye disease study; ARMD age related macular degeneration; POAG primary open angle glaucoma; EBMD epithelial/anterior basement membrane dystrophy; ACIOL anterior chamber intraocular lens; IOL intraocular lens; PCIOL posterior chamber intraocular lens; Phaco/IOL phacoemulsification with intraocular lens placement; PRK photorefractive keratectomy; LASIK laser assisted in situ keratomileusis; HTN hypertension; DM diabetes mellitus; COPD chronic obstructive pulmonary disease

## 2021-05-24 NOTE — Assessment & Plan Note (Signed)
Quiescent PDR OD status post vitrectomy right eye, PRP

## 2021-05-24 NOTE — Assessment & Plan Note (Signed)
Follow-up Dr. Ranae Pila regarding this issue as scheduled

## 2021-05-25 ENCOUNTER — Encounter (INDEPENDENT_AMBULATORY_CARE_PROVIDER_SITE_OTHER): Payer: Medicare Other | Admitting: Ophthalmology

## 2021-05-27 ENCOUNTER — Encounter (INDEPENDENT_AMBULATORY_CARE_PROVIDER_SITE_OTHER): Payer: Medicare Other | Admitting: Ophthalmology

## 2021-07-13 DIAGNOSIS — Z20822 Contact with and (suspected) exposure to covid-19: Secondary | ICD-10-CM | POA: Diagnosis not present

## 2021-07-16 DIAGNOSIS — I10 Essential (primary) hypertension: Secondary | ICD-10-CM | POA: Diagnosis not present

## 2021-07-16 DIAGNOSIS — N390 Urinary tract infection, site not specified: Secondary | ICD-10-CM | POA: Diagnosis not present

## 2021-07-31 DIAGNOSIS — Z23 Encounter for immunization: Secondary | ICD-10-CM | POA: Diagnosis not present

## 2021-08-03 DIAGNOSIS — Z23 Encounter for immunization: Secondary | ICD-10-CM | POA: Diagnosis not present

## 2021-08-06 DIAGNOSIS — R300 Dysuria: Secondary | ICD-10-CM | POA: Diagnosis not present

## 2021-08-12 DIAGNOSIS — L602 Onychogryphosis: Secondary | ICD-10-CM | POA: Diagnosis not present

## 2021-08-12 DIAGNOSIS — I739 Peripheral vascular disease, unspecified: Secondary | ICD-10-CM | POA: Diagnosis not present

## 2021-08-12 DIAGNOSIS — E1151 Type 2 diabetes mellitus with diabetic peripheral angiopathy without gangrene: Secondary | ICD-10-CM | POA: Diagnosis not present

## 2021-09-19 DIAGNOSIS — Z20828 Contact with and (suspected) exposure to other viral communicable diseases: Secondary | ICD-10-CM | POA: Diagnosis not present

## 2021-10-06 DIAGNOSIS — N390 Urinary tract infection, site not specified: Secondary | ICD-10-CM | POA: Diagnosis not present

## 2021-10-12 DIAGNOSIS — M5030 Other cervical disc degeneration, unspecified cervical region: Secondary | ICD-10-CM | POA: Diagnosis not present

## 2021-10-12 DIAGNOSIS — R413 Other amnesia: Secondary | ICD-10-CM | POA: Diagnosis not present

## 2021-10-12 DIAGNOSIS — N1832 Chronic kidney disease, stage 3b: Secondary | ICD-10-CM | POA: Diagnosis not present

## 2021-10-12 DIAGNOSIS — N390 Urinary tract infection, site not specified: Secondary | ICD-10-CM | POA: Diagnosis not present

## 2021-10-12 DIAGNOSIS — I129 Hypertensive chronic kidney disease with stage 1 through stage 4 chronic kidney disease, or unspecified chronic kidney disease: Secondary | ICD-10-CM | POA: Diagnosis not present

## 2021-10-12 DIAGNOSIS — R300 Dysuria: Secondary | ICD-10-CM | POA: Diagnosis not present

## 2021-10-12 DIAGNOSIS — D890 Polyclonal hypergammaglobulinemia: Secondary | ICD-10-CM | POA: Diagnosis not present

## 2021-10-12 DIAGNOSIS — E785 Hyperlipidemia, unspecified: Secondary | ICD-10-CM | POA: Diagnosis not present

## 2021-10-12 DIAGNOSIS — E1169 Type 2 diabetes mellitus with other specified complication: Secondary | ICD-10-CM | POA: Diagnosis not present

## 2021-10-12 DIAGNOSIS — D649 Anemia, unspecified: Secondary | ICD-10-CM | POA: Diagnosis not present

## 2021-10-12 DIAGNOSIS — J309 Allergic rhinitis, unspecified: Secondary | ICD-10-CM | POA: Diagnosis not present

## 2021-11-03 DIAGNOSIS — E1151 Type 2 diabetes mellitus with diabetic peripheral angiopathy without gangrene: Secondary | ICD-10-CM | POA: Diagnosis not present

## 2021-11-03 DIAGNOSIS — I739 Peripheral vascular disease, unspecified: Secondary | ICD-10-CM | POA: Diagnosis not present

## 2021-11-03 DIAGNOSIS — L602 Onychogryphosis: Secondary | ICD-10-CM | POA: Diagnosis not present

## 2021-11-03 DIAGNOSIS — B351 Tinea unguium: Secondary | ICD-10-CM | POA: Diagnosis not present

## 2021-11-22 DIAGNOSIS — Z20822 Contact with and (suspected) exposure to covid-19: Secondary | ICD-10-CM | POA: Diagnosis not present

## 2021-11-28 DIAGNOSIS — E1169 Type 2 diabetes mellitus with other specified complication: Secondary | ICD-10-CM | POA: Diagnosis not present

## 2021-11-28 DIAGNOSIS — I10 Essential (primary) hypertension: Secondary | ICD-10-CM | POA: Diagnosis not present

## 2021-11-28 DIAGNOSIS — N1832 Chronic kidney disease, stage 3b: Secondary | ICD-10-CM | POA: Diagnosis not present

## 2021-11-28 DIAGNOSIS — E785 Hyperlipidemia, unspecified: Secondary | ICD-10-CM | POA: Diagnosis not present

## 2021-11-29 DIAGNOSIS — N3020 Other chronic cystitis without hematuria: Secondary | ICD-10-CM | POA: Diagnosis not present

## 2021-11-29 DIAGNOSIS — R3915 Urgency of urination: Secondary | ICD-10-CM | POA: Diagnosis not present

## 2021-11-29 DIAGNOSIS — N952 Postmenopausal atrophic vaginitis: Secondary | ICD-10-CM | POA: Diagnosis not present

## 2021-12-06 DIAGNOSIS — Z20822 Contact with and (suspected) exposure to covid-19: Secondary | ICD-10-CM | POA: Diagnosis not present

## 2021-12-08 ENCOUNTER — Other Ambulatory Visit: Payer: Self-pay | Admitting: Internal Medicine

## 2021-12-08 DIAGNOSIS — Z1231 Encounter for screening mammogram for malignant neoplasm of breast: Secondary | ICD-10-CM

## 2021-12-15 DIAGNOSIS — I700 Atherosclerosis of aorta: Secondary | ICD-10-CM | POA: Diagnosis not present

## 2021-12-15 DIAGNOSIS — N3020 Other chronic cystitis without hematuria: Secondary | ICD-10-CM | POA: Diagnosis not present

## 2021-12-15 DIAGNOSIS — N3289 Other specified disorders of bladder: Secondary | ICD-10-CM | POA: Diagnosis not present

## 2021-12-15 DIAGNOSIS — R911 Solitary pulmonary nodule: Secondary | ICD-10-CM | POA: Diagnosis not present

## 2021-12-23 ENCOUNTER — Ambulatory Visit: Payer: Medicare Other

## 2021-12-23 ENCOUNTER — Ambulatory Visit
Admission: RE | Admit: 2021-12-23 | Discharge: 2021-12-23 | Disposition: A | Discharge status: Home / Self Care | Payer: Medicare Other | Source: Ambulatory Visit | Attending: Internal Medicine | Admitting: Internal Medicine

## 2021-12-23 DIAGNOSIS — Z1231 Encounter for screening mammogram for malignant neoplasm of breast: Secondary | ICD-10-CM

## 2021-12-24 ENCOUNTER — Ambulatory Visit: Payer: Medicare Other

## 2021-12-27 ENCOUNTER — Other Ambulatory Visit: Payer: Self-pay

## 2021-12-27 ENCOUNTER — Ambulatory Visit (INDEPENDENT_AMBULATORY_CARE_PROVIDER_SITE_OTHER): Payer: Medicare Other | Admitting: Ophthalmology

## 2021-12-27 ENCOUNTER — Encounter (INDEPENDENT_AMBULATORY_CARE_PROVIDER_SITE_OTHER): Payer: Self-pay | Admitting: Ophthalmology

## 2021-12-27 DIAGNOSIS — E113392 Type 2 diabetes mellitus with moderate nonproliferative diabetic retinopathy without macular edema, left eye: Secondary | ICD-10-CM

## 2021-12-27 DIAGNOSIS — H43822 Vitreomacular adhesion, left eye: Secondary | ICD-10-CM | POA: Diagnosis not present

## 2021-12-27 DIAGNOSIS — E113551 Type 2 diabetes mellitus with stable proliferative diabetic retinopathy, right eye: Secondary | ICD-10-CM

## 2021-12-27 DIAGNOSIS — H43821 Vitreomacular adhesion, right eye: Secondary | ICD-10-CM | POA: Diagnosis not present

## 2021-12-27 NOTE — Assessment & Plan Note (Signed)
Resolved post vitrectomy 

## 2021-12-27 NOTE — Assessment & Plan Note (Signed)
Minor and stable 

## 2021-12-27 NOTE — Assessment & Plan Note (Signed)
Moderate NPDR but high risk for developing severe NPDR or PDR.  We will continue to monitor carefully at 63-month intervals

## 2021-12-27 NOTE — Assessment & Plan Note (Signed)
OD quiescent PDR, minor CSME, not impactful will observe

## 2021-12-27 NOTE — Progress Notes (Signed)
12/27/2021     CHIEF COMPLAINT Patient presents for  Chief Complaint  Patient presents with   Diabetic Retinopathy without Macular Edema      HISTORY OF PRESENT ILLNESS: [name] Mathews is a 88 y.o. female who presents to the clinic today for:   HPI   7 mos fu ou oct fp. Patient states vision is stable and unchanged since last visit. Denies any new floaters or FOL.  Last edited by Nelva NayKronstein, Anna N on 12/27/2021 11:02 AM.      Referring physician: Rodrigo RanPerini, Mark, MD [address] [place] [id number]  HISTORICAL INFORMATION:   Selected notes from the MEDICAL RECORD NUMBER    Lab Results  Component Value Date   HGBA1C 6.5 (H) 06/06/2019     CURRENT MEDICATIONS: No current outpatient medications on file. (Ophthalmic Drugs)   No current facility-administered medications for this visit. (Ophthalmic Drugs)   Current Outpatient Medications (Other)  Medication Sig   acetaminophen (TYLENOL) 325 MG tablet Take 2 tablets (650 mg total) by mouth every 4 (four) hours as needed for mild pain ((score 1 to 3) or temp > 100.5).   amLODipine (NORVASC) 10 MG tablet Take 1 tablet by mouth once.   amLODipine (NORVASC) 5 MG tablet Take 1 tablet (5 mg total) by mouth daily.   aspirin 81 MG tablet Take 81 mg by mouth daily.   Bacillus Coagulans-Inulin (ALIGN PREBIOTIC-PROBIOTIC PO) Take by mouth.   cefdinir (OMNICEF) 300 MG capsule Take 300 mg by mouth 2 (two) times daily.   Cholecalciferol (VITAMIN D3) 125 MCG (5000 UT) TABS Take 1 tablet (5,000 Units total) by mouth daily.   cyclobenzaprine (FLEXERIL) 10 MG tablet Take 1 tablet (10 mg total) by mouth 3 (three) times daily as needed for muscle spasms.   docusate sodium (COLACE) 250 MG capsule Take 250 mg by mouth daily.   Ferrous Sulfate (SLOW RELEASE IRON PO) Take 1 tablet by mouth daily.   fluticasone (FLONASE) 50 MCG/ACT nasal spray Place 1 spray into both nostrils daily as needed for allergies or rhinitis.   furosemide (LASIX)  20 MG tablet Take 20 mg by mouth daily.   galantamine (RAZADYNE ER) 16 MG 24 hr capsule Take 16 mg by mouth every evening.   hydrALAZINE (APRESOLINE) 25 MG tablet Take 25 mg by mouth 3 (three) times daily.   Lactobacillus-Inulin (PROBIOTIC DIGESTIVE SUPPORT PO) Take 2 each by mouth every morning.   Lancets (ONETOUCH DELICA PLUS LANCET33G) MISC Apply 1 each topically daily.   linaclotide (LINZESS) 145 MCG CAPS capsule Take 145 mcg by mouth every other day.   Multiple Vitamins-Minerals (MULTIVITAMIN PO) Take 2 each by mouth daily.    MYRBETRIQ 50 MG TB24 tablet Take 50 mg by mouth daily.   nitrofurantoin, macrocrystal-monohydrate, (MACROBID) 100 MG capsule Take 100 mg by mouth 2 (two) times daily. For 14 days   ONETOUCH VERIO test strip USE 1 TEST STRIP TO CHECK BLOOD GLUCOSE ONCE A DAY DX  E11.9   pioglitazone (ACTOS) 30 MG tablet Take 1 tablet (30 mg total) by mouth daily.   rosuvastatin (CRESTOR) 5 MG tablet Take 1 tablet (5 mg total) by mouth daily.   telmisartan (MICARDIS) 40 MG tablet Take 40 mg by mouth daily.   telmisartan (MICARDIS) 80 MG tablet Take 80 mg by mouth daily.   vitamin B-12 (CYANOCOBALAMIN) 1000 MCG tablet Take 1,000 mcg by mouth daily. OTC   vitamin C (ASCORBIC ACID) 500 MG tablet Take 500 mg by mouth  daily.   No current facility-administered medications for this visit. (Other)      REVIEW OF SYSTEMS: ROS   Negative for: Constitutional, Gastrointestinal, Neurological, Skin, Genitourinary, Musculoskeletal, HENT, Endocrine, Cardiovascular, Eyes, Respiratory, Psychiatric, Allergic/Imm, Heme/Lymph Last edited by Edmon Crape, MD on 12/27/2021 11:43 AM.       ALLERGIES Allergies  Allergen Reactions   Januvia [Sitagliptin] Other (See Comments)    Numbness in mouth   Simvastatin Other (See Comments)    tremors    PAST MEDICAL HISTORY Past Medical History:  Diagnosis Date   Anemia    hx of   Arthritis    Breast lump    hx of   Cataract    left eye    Chronic back pain    Diabetes mellitus    Hyperlipidemia    Hypertension    sees Dr. Margret Chance, [facility] internal medicine   Normochromic normocytic anemia 05/22/2017   Hb 10 MCV 85.6 No monoclonal spike on SPEP  Creatinine 1.3-1.6   Peripheral vascular disease (HCC)    Vitreomacular adhesion of right eye 02/03/2020   Vitrectomy, PRP to release VMT, 11-11-2020   Past Surgical History:  Procedure Laterality Date   APPENDECTOMY     BACK SURGERY     BREAST CYST EXCISION Left    BREAST SURGERY     cyst removed left side   BUNIONECTOMY     CARDIOVASCULAR STRESS TEST     COLONOSCOPY     DILATION AND CURETTAGE OF UTERUS     TONSILLECTOMY      FAMILY HISTORY Family History  Problem Relation Age of Onset   Breast cancer Sister 3    SOCIAL HISTORY Social History   Tobacco Use   Smoking status: Never   Smokeless tobacco: Never  Vaping Use   Vaping Use: Never used  Substance Use Topics   Alcohol use: No   Drug use: No         OPHTHALMIC EXAM:  Base Eye Exam     Visual Acuity (ETDRS)       Right Left   Dist [place] 20/20 -1    Dist cc  20/20    Correction: Glasses  Per patient daughter, plano prescription in right eye in glasses.        Tonometry (Tonopen, 11:06 AM)       Right Left   Pressure 15 15         Pupils       Pupils Dark Light React APD   Right PERRL 2 2 Minimal None   Left PERRL 2 2 Minimal None         Visual Fields (Counting fingers)       Left Right    Full Full         Extraocular Movement       Right Left    Full Full         Neuro/Psych     Oriented x3: Yes   Mood/Affect: Normal         Dilation     Both eyes: 1.0% Mydriacyl, 2.5% Phenylephrine @ 11:06 AM           Slit Lamp and Fundus Exam     External Exam       Right Left   External Normal Normal         Slit Lamp Exam       Right Left   Lids/Lashes Normal Normal   Conjunctiva/Sclera White and  quiet White and quiet   Cornea Clear  Clear   Anterior Chamber Deep and quiet Deep and quiet   Iris Round and reactive Round and reactive   Lens Centered posterior chamber intraocular lens 2+ Nuclear sclerosis, 2+ Cortical cataract   Anterior Vitreous Normal clear Normal         Fundus Exam       Right Left   Posterior Vitreous Clear, vitrectomized vastly improved Posterior vitreous detachment   Disc Normal Normal   C/D Ratio 0.5 0.45   Macula Macular thickening, Microaneurysms, Mild clinically significant macular edema Microaneurysms   Vessels PDR-quiet    Periphery Good PRP peripheral anterior             IMAGING AND PROCEDURES  Imaging and Procedures for 12/27/21  OCT, Retina - OU - Both Eyes       Right Eye Quality was good. Scan locations included subfoveal. Central Foveal Thickness: 391. Progression has been stable. Findings include abnormal foveal contour.   Left Eye Quality was good. Scan locations included subfoveal. Central Foveal Thickness: 260. Progression has been stable.   Notes Minor perifoveal CSME inferior to the fovea yet still overall center involved thickening continues to improve slowly.  Now 14 months post most recent injection into vegF.  Quiescent PDR with good PRP post vitrectomy.  OS with severe NPDR but no signs of CSME OS     Color Fundus Photography Optos - OU - Both Eyes       Right Eye Progression has no prior data. Disc findings include normal observations. Macula : microaneurysms.   Left Eye Progression has no prior data. Disc findings include normal observations. Macula : normal observations. Vessels : normal observations. Periphery : normal observations.   Notes PDR OD quiescent, medium             ASSESSMENT/PLAN:  Stable treated proliferative diabetic retinopathy of right eye determined by examination associated with type 2 diabetes mellitus (HCC) OD quiescent PDR, minor CSME, not impactful will observe  Moderate nonproliferative diabetic retinopathy  of left eye (HCC) Moderate NPDR but high risk for developing severe NPDR or PDR.  We will continue to monitor carefully at 44-month intervals  Vitreomacular adhesion of right eye Resolved post vitrectomy  Vitreomacular adhesion of left eye Minor and stable     ICD-10-CM   1. Moderate nonproliferative diabetic retinopathy of left eye without macular edema associated with type 2 diabetes mellitus (HCC)  E11.3392 OCT, Retina - OU - Both Eyes    Color Fundus Photography Optos - OU - Both Eyes    2. Stable treated proliferative diabetic retinopathy of right eye determined by examination associated with type 2 diabetes mellitus (HCC)  J24.2683     3. Vitreomacular adhesion of right eye  H43.821     4. Vitreomacular adhesion of left eye  H43.822       1.  OD, quiescent PDR, minor CSME impact no vision , will continue to monitor and observe  2.  OS moderate NPDR risk for progression of PDR or in severe NPDR we will continue to monitor  3.  Ophthalmic Meds Ordered this visit:  No orders of the defined types were placed in this encounter.      Return in about 6 months (around 06/26/2022) for DILATE OU, COLOR FP, OCT.  There are no Patient Instructions on file for this visit.   Explained the diagnoses, plan, and follow up with the patient and they expressed understanding.  Patient  expressed understanding of the importance of proper follow up care.   Alford Highland Yolando Gillum M.D. Diseases & Surgery of the Retina and Vitreous [facility] 12/27/21     Abbreviations: M myopia (nearsighted); A astigmatism; H hyperopia (farsighted); P presbyopia; Mrx spectacle prescription;  CTL contact lenses; OD right eye; OS left eye; OU both eyes  XT exotropia; ET esotropia; PEK punctate epithelial keratitis; PEE punctate epithelial erosions; DES dry eye syndrome; MGD meibomian gland dysfunction; ATs artificial tears; PFAT's preservative free artificial tears; NSC nuclear sclerotic cataract;  PSC posterior subcapsular cataract; ERM epi-retinal membrane; PVD posterior vitreous detachment; RD retinal detachment; DM diabetes mellitus; DR diabetic retinopathy; NPDR non-proliferative diabetic retinopathy; PDR proliferative diabetic retinopathy; CSME clinically significant macular edema; DME diabetic macular edema; dbh dot blot hemorrhages; CWS cotton wool spot; POAG primary open angle glaucoma; C/D cup-to-disc ratio; HVF humphrey visual field; GVF goldmann visual field; OCT optical coherence tomography; IOP intraocular pressure; BRVO Branch retinal vein occlusion; CRVO central retinal vein occlusion; CRAO central retinal artery occlusion; BRAO branch retinal artery occlusion; RT retinal tear; SB scleral buckle; PPV pars plana vitrectomy; VH Vitreous hemorrhage; PRP panretinal laser photocoagulation; IVK intravitreal kenalog; VMT vitreomacular traction; MH Macular hole;  NVD neovascularization of the disc; NVE neovascularization elsewhere; AREDS age related eye disease study; ARMD age related macular degeneration; POAG primary open angle glaucoma; EBMD epithelial/anterior basement membrane dystrophy; ACIOL anterior chamber intraocular lens; IOL intraocular lens; PCIOL posterior chamber intraocular lens; Phaco/IOL phacoemulsification with intraocular lens placement; PRK photorefractive keratectomy; LASIK laser assisted in situ keratomileusis; HTN hypertension; DM diabetes mellitus; COPD chronic obstructive pulmonary disease

## 2022-01-07 DIAGNOSIS — Z20822 Contact with and (suspected) exposure to covid-19: Secondary | ICD-10-CM | POA: Diagnosis not present

## 2022-01-13 DIAGNOSIS — Z20822 Contact with and (suspected) exposure to covid-19: Secondary | ICD-10-CM | POA: Diagnosis not present

## 2022-01-18 DIAGNOSIS — M859 Disorder of bone density and structure, unspecified: Secondary | ICD-10-CM | POA: Diagnosis not present

## 2022-01-18 DIAGNOSIS — E785 Hyperlipidemia, unspecified: Secondary | ICD-10-CM | POA: Diagnosis not present

## 2022-01-18 DIAGNOSIS — I10 Essential (primary) hypertension: Secondary | ICD-10-CM | POA: Diagnosis not present

## 2022-01-18 DIAGNOSIS — M8589 Other specified disorders of bone density and structure, multiple sites: Secondary | ICD-10-CM | POA: Diagnosis not present

## 2022-01-18 DIAGNOSIS — E1169 Type 2 diabetes mellitus with other specified complication: Secondary | ICD-10-CM | POA: Diagnosis not present

## 2022-01-25 DIAGNOSIS — E1169 Type 2 diabetes mellitus with other specified complication: Secondary | ICD-10-CM | POA: Diagnosis not present

## 2022-01-25 DIAGNOSIS — N3281 Overactive bladder: Secondary | ICD-10-CM | POA: Diagnosis not present

## 2022-01-25 DIAGNOSIS — Z23 Encounter for immunization: Secondary | ICD-10-CM | POA: Diagnosis not present

## 2022-01-25 DIAGNOSIS — I10 Essential (primary) hypertension: Secondary | ICD-10-CM | POA: Diagnosis not present

## 2022-01-25 DIAGNOSIS — D890 Polyclonal hypergammaglobulinemia: Secondary | ICD-10-CM | POA: Diagnosis not present

## 2022-01-25 DIAGNOSIS — R413 Other amnesia: Secondary | ICD-10-CM | POA: Diagnosis not present

## 2022-01-25 DIAGNOSIS — Z0000 Encounter for general adult medical examination without abnormal findings: Secondary | ICD-10-CM | POA: Diagnosis not present

## 2022-01-25 DIAGNOSIS — R809 Proteinuria, unspecified: Secondary | ICD-10-CM | POA: Diagnosis not present

## 2022-01-25 DIAGNOSIS — M8580 Other specified disorders of bone density and structure, unspecified site: Secondary | ICD-10-CM | POA: Diagnosis not present

## 2022-01-25 DIAGNOSIS — Z1331 Encounter for screening for depression: Secondary | ICD-10-CM | POA: Diagnosis not present

## 2022-01-25 DIAGNOSIS — R82998 Other abnormal findings in urine: Secondary | ICD-10-CM | POA: Diagnosis not present

## 2022-01-25 DIAGNOSIS — E785 Hyperlipidemia, unspecified: Secondary | ICD-10-CM | POA: Diagnosis not present

## 2022-01-26 DIAGNOSIS — I739 Peripheral vascular disease, unspecified: Secondary | ICD-10-CM | POA: Diagnosis not present

## 2022-01-26 DIAGNOSIS — E1151 Type 2 diabetes mellitus with diabetic peripheral angiopathy without gangrene: Secondary | ICD-10-CM | POA: Diagnosis not present

## 2022-01-26 DIAGNOSIS — Z20828 Contact with and (suspected) exposure to other viral communicable diseases: Secondary | ICD-10-CM | POA: Diagnosis not present

## 2022-01-26 DIAGNOSIS — B351 Tinea unguium: Secondary | ICD-10-CM | POA: Diagnosis not present

## 2022-01-26 DIAGNOSIS — L602 Onychogryphosis: Secondary | ICD-10-CM | POA: Diagnosis not present

## 2022-01-26 DIAGNOSIS — Z1152 Encounter for screening for COVID-19: Secondary | ICD-10-CM | POA: Diagnosis not present

## 2022-02-07 DIAGNOSIS — I10 Essential (primary) hypertension: Secondary | ICD-10-CM | POA: Diagnosis not present

## 2022-02-11 DIAGNOSIS — I10 Essential (primary) hypertension: Secondary | ICD-10-CM | POA: Diagnosis not present

## 2022-02-11 DIAGNOSIS — N390 Urinary tract infection, site not specified: Secondary | ICD-10-CM | POA: Diagnosis not present

## 2022-02-11 DIAGNOSIS — Z20822 Contact with and (suspected) exposure to covid-19: Secondary | ICD-10-CM | POA: Diagnosis not present

## 2022-02-13 DIAGNOSIS — Z20822 Contact with and (suspected) exposure to covid-19: Secondary | ICD-10-CM | POA: Diagnosis not present

## 2022-02-15 DIAGNOSIS — I10 Essential (primary) hypertension: Secondary | ICD-10-CM | POA: Diagnosis not present

## 2022-02-17 DIAGNOSIS — Z20822 Contact with and (suspected) exposure to covid-19: Secondary | ICD-10-CM | POA: Diagnosis not present

## 2022-02-17 DIAGNOSIS — N3020 Other chronic cystitis without hematuria: Secondary | ICD-10-CM | POA: Diagnosis not present

## 2022-02-18 DIAGNOSIS — H40053 Ocular hypertension, bilateral: Secondary | ICD-10-CM | POA: Diagnosis not present

## 2022-02-18 DIAGNOSIS — E113293 Type 2 diabetes mellitus with mild nonproliferative diabetic retinopathy without macular edema, bilateral: Secondary | ICD-10-CM | POA: Diagnosis not present

## 2022-02-18 DIAGNOSIS — H5203 Hypermetropia, bilateral: Secondary | ICD-10-CM | POA: Diagnosis not present

## 2022-02-18 DIAGNOSIS — H2512 Age-related nuclear cataract, left eye: Secondary | ICD-10-CM | POA: Diagnosis not present

## 2022-03-04 DIAGNOSIS — Z20822 Contact with and (suspected) exposure to covid-19: Secondary | ICD-10-CM | POA: Diagnosis not present

## 2022-03-07 DIAGNOSIS — U071 COVID-19: Secondary | ICD-10-CM | POA: Diagnosis not present

## 2022-03-07 DIAGNOSIS — Z20822 Contact with and (suspected) exposure to covid-19: Secondary | ICD-10-CM | POA: Diagnosis not present

## 2022-03-08 DIAGNOSIS — Z20822 Contact with and (suspected) exposure to covid-19: Secondary | ICD-10-CM | POA: Diagnosis not present

## 2022-03-08 DIAGNOSIS — Z20828 Contact with and (suspected) exposure to other viral communicable diseases: Secondary | ICD-10-CM | POA: Diagnosis not present

## 2022-03-09 DIAGNOSIS — R051 Acute cough: Secondary | ICD-10-CM | POA: Diagnosis not present

## 2022-03-09 DIAGNOSIS — R059 Cough, unspecified: Secondary | ICD-10-CM | POA: Diagnosis not present

## 2022-03-09 DIAGNOSIS — Z20822 Contact with and (suspected) exposure to covid-19: Secondary | ICD-10-CM | POA: Diagnosis not present

## 2022-04-15 DIAGNOSIS — E1169 Type 2 diabetes mellitus with other specified complication: Secondary | ICD-10-CM | POA: Diagnosis not present

## 2022-04-15 DIAGNOSIS — M8580 Other specified disorders of bone density and structure, unspecified site: Secondary | ICD-10-CM | POA: Diagnosis not present

## 2022-04-15 DIAGNOSIS — R413 Other amnesia: Secondary | ICD-10-CM | POA: Diagnosis not present

## 2022-04-15 DIAGNOSIS — I129 Hypertensive chronic kidney disease with stage 1 through stage 4 chronic kidney disease, or unspecified chronic kidney disease: Secondary | ICD-10-CM | POA: Diagnosis not present

## 2022-04-15 DIAGNOSIS — D649 Anemia, unspecified: Secondary | ICD-10-CM | POA: Diagnosis not present

## 2022-04-15 DIAGNOSIS — N1832 Chronic kidney disease, stage 3b: Secondary | ICD-10-CM | POA: Diagnosis not present

## 2022-04-15 DIAGNOSIS — E785 Hyperlipidemia, unspecified: Secondary | ICD-10-CM | POA: Diagnosis not present

## 2022-04-15 DIAGNOSIS — D890 Polyclonal hypergammaglobulinemia: Secondary | ICD-10-CM | POA: Diagnosis not present

## 2022-04-25 DIAGNOSIS — I739 Peripheral vascular disease, unspecified: Secondary | ICD-10-CM | POA: Diagnosis not present

## 2022-04-25 DIAGNOSIS — B351 Tinea unguium: Secondary | ICD-10-CM | POA: Diagnosis not present

## 2022-04-25 DIAGNOSIS — L602 Onychogryphosis: Secondary | ICD-10-CM | POA: Diagnosis not present

## 2022-04-25 DIAGNOSIS — E1151 Type 2 diabetes mellitus with diabetic peripheral angiopathy without gangrene: Secondary | ICD-10-CM | POA: Diagnosis not present

## 2022-04-25 DIAGNOSIS — I10 Essential (primary) hypertension: Secondary | ICD-10-CM | POA: Diagnosis not present

## 2022-04-25 DIAGNOSIS — R7989 Other specified abnormal findings of blood chemistry: Secondary | ICD-10-CM | POA: Diagnosis not present

## 2022-04-25 DIAGNOSIS — N1832 Chronic kidney disease, stage 3b: Secondary | ICD-10-CM | POA: Diagnosis not present

## 2022-05-06 DIAGNOSIS — D890 Polyclonal hypergammaglobulinemia: Secondary | ICD-10-CM | POA: Diagnosis not present

## 2022-05-06 DIAGNOSIS — M5030 Other cervical disc degeneration, unspecified cervical region: Secondary | ICD-10-CM | POA: Diagnosis not present

## 2022-05-06 DIAGNOSIS — K219 Gastro-esophageal reflux disease without esophagitis: Secondary | ICD-10-CM | POA: Diagnosis not present

## 2022-05-06 DIAGNOSIS — E785 Hyperlipidemia, unspecified: Secondary | ICD-10-CM | POA: Diagnosis not present

## 2022-05-06 DIAGNOSIS — M1990 Unspecified osteoarthritis, unspecified site: Secondary | ICD-10-CM | POA: Diagnosis not present

## 2022-05-06 DIAGNOSIS — H3530 Unspecified macular degeneration: Secondary | ICD-10-CM | POA: Diagnosis not present

## 2022-05-06 DIAGNOSIS — Z9181 History of falling: Secondary | ICD-10-CM | POA: Diagnosis not present

## 2022-05-06 DIAGNOSIS — G4733 Obstructive sleep apnea (adult) (pediatric): Secondary | ICD-10-CM | POA: Diagnosis not present

## 2022-05-06 DIAGNOSIS — Z7982 Long term (current) use of aspirin: Secondary | ICD-10-CM | POA: Diagnosis not present

## 2022-05-06 DIAGNOSIS — D631 Anemia in chronic kidney disease: Secondary | ICD-10-CM | POA: Diagnosis not present

## 2022-05-06 DIAGNOSIS — M8580 Other specified disorders of bone density and structure, unspecified site: Secondary | ICD-10-CM | POA: Diagnosis not present

## 2022-05-06 DIAGNOSIS — K5900 Constipation, unspecified: Secondary | ICD-10-CM | POA: Diagnosis not present

## 2022-05-06 DIAGNOSIS — N1832 Chronic kidney disease, stage 3b: Secondary | ICD-10-CM | POA: Diagnosis not present

## 2022-05-06 DIAGNOSIS — I129 Hypertensive chronic kidney disease with stage 1 through stage 4 chronic kidney disease, or unspecified chronic kidney disease: Secondary | ICD-10-CM | POA: Diagnosis not present

## 2022-05-06 DIAGNOSIS — Z7951 Long term (current) use of inhaled steroids: Secondary | ICD-10-CM | POA: Diagnosis not present

## 2022-05-06 DIAGNOSIS — R413 Other amnesia: Secondary | ICD-10-CM | POA: Diagnosis not present

## 2022-05-06 DIAGNOSIS — E1122 Type 2 diabetes mellitus with diabetic chronic kidney disease: Secondary | ICD-10-CM | POA: Diagnosis not present

## 2022-05-06 DIAGNOSIS — N3281 Overactive bladder: Secondary | ICD-10-CM | POA: Diagnosis not present

## 2022-05-06 DIAGNOSIS — M5136 Other intervertebral disc degeneration, lumbar region: Secondary | ICD-10-CM | POA: Diagnosis not present

## 2022-05-17 DIAGNOSIS — M5136 Other intervertebral disc degeneration, lumbar region: Secondary | ICD-10-CM | POA: Diagnosis not present

## 2022-05-17 DIAGNOSIS — E1122 Type 2 diabetes mellitus with diabetic chronic kidney disease: Secondary | ICD-10-CM | POA: Diagnosis not present

## 2022-05-17 DIAGNOSIS — I129 Hypertensive chronic kidney disease with stage 1 through stage 4 chronic kidney disease, or unspecified chronic kidney disease: Secondary | ICD-10-CM | POA: Diagnosis not present

## 2022-05-17 DIAGNOSIS — N1832 Chronic kidney disease, stage 3b: Secondary | ICD-10-CM | POA: Diagnosis not present

## 2022-05-17 DIAGNOSIS — M5030 Other cervical disc degeneration, unspecified cervical region: Secondary | ICD-10-CM | POA: Diagnosis not present

## 2022-05-17 DIAGNOSIS — D631 Anemia in chronic kidney disease: Secondary | ICD-10-CM | POA: Diagnosis not present

## 2022-05-19 ENCOUNTER — Other Ambulatory Visit: Payer: Self-pay

## 2022-05-19 DIAGNOSIS — M810 Age-related osteoporosis without current pathological fracture: Secondary | ICD-10-CM

## 2022-05-20 ENCOUNTER — Telehealth: Payer: Self-pay | Admitting: Pharmacy Technician

## 2022-05-20 NOTE — Telephone Encounter (Addendum)
Auth Submission: no auth needed Payer: medicare a/b & atena Medication & CPT/J Code(s) submitted: Prolia (Denosumab) [id number] Route of submission (phone, fax, portal): phone Auth type: Buy/Bill Units/visits requested: x1 dose Reference number:  Approval from: 05/20/22 to 10/30/22  Medicare and supplement coverage reviewed and approval extended to 05/31/23

## 2022-05-25 DIAGNOSIS — M5136 Other intervertebral disc degeneration, lumbar region: Secondary | ICD-10-CM | POA: Diagnosis not present

## 2022-05-25 DIAGNOSIS — M5030 Other cervical disc degeneration, unspecified cervical region: Secondary | ICD-10-CM | POA: Diagnosis not present

## 2022-05-25 DIAGNOSIS — E1122 Type 2 diabetes mellitus with diabetic chronic kidney disease: Secondary | ICD-10-CM | POA: Diagnosis not present

## 2022-05-25 DIAGNOSIS — N1832 Chronic kidney disease, stage 3b: Secondary | ICD-10-CM | POA: Diagnosis not present

## 2022-05-25 DIAGNOSIS — D631 Anemia in chronic kidney disease: Secondary | ICD-10-CM | POA: Diagnosis not present

## 2022-05-25 DIAGNOSIS — I129 Hypertensive chronic kidney disease with stage 1 through stage 4 chronic kidney disease, or unspecified chronic kidney disease: Secondary | ICD-10-CM | POA: Diagnosis not present

## 2022-06-05 DIAGNOSIS — H3530 Unspecified macular degeneration: Secondary | ICD-10-CM | POA: Diagnosis not present

## 2022-06-05 DIAGNOSIS — I129 Hypertensive chronic kidney disease with stage 1 through stage 4 chronic kidney disease, or unspecified chronic kidney disease: Secondary | ICD-10-CM | POA: Diagnosis not present

## 2022-06-05 DIAGNOSIS — Z7951 Long term (current) use of inhaled steroids: Secondary | ICD-10-CM | POA: Diagnosis not present

## 2022-06-05 DIAGNOSIS — E785 Hyperlipidemia, unspecified: Secondary | ICD-10-CM | POA: Diagnosis not present

## 2022-06-05 DIAGNOSIS — M5030 Other cervical disc degeneration, unspecified cervical region: Secondary | ICD-10-CM | POA: Diagnosis not present

## 2022-06-05 DIAGNOSIS — Z7982 Long term (current) use of aspirin: Secondary | ICD-10-CM | POA: Diagnosis not present

## 2022-06-05 DIAGNOSIS — K5900 Constipation, unspecified: Secondary | ICD-10-CM | POA: Diagnosis not present

## 2022-06-05 DIAGNOSIS — R413 Other amnesia: Secondary | ICD-10-CM | POA: Diagnosis not present

## 2022-06-05 DIAGNOSIS — G4733 Obstructive sleep apnea (adult) (pediatric): Secondary | ICD-10-CM | POA: Diagnosis not present

## 2022-06-05 DIAGNOSIS — M8580 Other specified disorders of bone density and structure, unspecified site: Secondary | ICD-10-CM | POA: Diagnosis not present

## 2022-06-05 DIAGNOSIS — M5136 Other intervertebral disc degeneration, lumbar region: Secondary | ICD-10-CM | POA: Diagnosis not present

## 2022-06-05 DIAGNOSIS — N3281 Overactive bladder: Secondary | ICD-10-CM | POA: Diagnosis not present

## 2022-06-05 DIAGNOSIS — N1832 Chronic kidney disease, stage 3b: Secondary | ICD-10-CM | POA: Diagnosis not present

## 2022-06-05 DIAGNOSIS — E1122 Type 2 diabetes mellitus with diabetic chronic kidney disease: Secondary | ICD-10-CM | POA: Diagnosis not present

## 2022-06-05 DIAGNOSIS — D631 Anemia in chronic kidney disease: Secondary | ICD-10-CM | POA: Diagnosis not present

## 2022-06-05 DIAGNOSIS — M1990 Unspecified osteoarthritis, unspecified site: Secondary | ICD-10-CM | POA: Diagnosis not present

## 2022-06-05 DIAGNOSIS — Z9181 History of falling: Secondary | ICD-10-CM | POA: Diagnosis not present

## 2022-06-05 DIAGNOSIS — K219 Gastro-esophageal reflux disease without esophagitis: Secondary | ICD-10-CM | POA: Diagnosis not present

## 2022-06-05 DIAGNOSIS — D890 Polyclonal hypergammaglobulinemia: Secondary | ICD-10-CM | POA: Diagnosis not present

## 2022-06-07 ENCOUNTER — Ambulatory Visit (INDEPENDENT_AMBULATORY_CARE_PROVIDER_SITE_OTHER): Payer: Medicare Other | Admitting: *Deleted

## 2022-06-07 VITALS — BP 180/77 | HR 72 | Temp 98.9°F | Resp 16 | Ht 67.0 in | Wt 230.6 lb

## 2022-06-07 DIAGNOSIS — M810 Age-related osteoporosis without current pathological fracture: Secondary | ICD-10-CM | POA: Diagnosis not present

## 2022-06-07 MED ORDER — DENOSUMAB 60 MG/ML ~~LOC~~ SOSY
60.0000 mg | PREFILLED_SYRINGE | Freq: Once | SUBCUTANEOUS | Status: AC
  Administered 2022-06-07: 60 mg via SUBCUTANEOUS
  Filled 2022-06-07: qty 1

## 2022-06-07 NOTE — Progress Notes (Signed)
Diagnosis: Osteoporosis  Provider:  Praveen Mannam, MD  Procedure: Injection  Prolia (Denosumab), Dose: 60 mg, Site: subcutaneous, Number of injections: 1  Discharge: Condition: Good, Destination: Home . AVS provided to patient.   Performed by:  Gila Lauf A, RN       

## 2022-06-09 DIAGNOSIS — M5030 Other cervical disc degeneration, unspecified cervical region: Secondary | ICD-10-CM | POA: Diagnosis not present

## 2022-06-09 DIAGNOSIS — D631 Anemia in chronic kidney disease: Secondary | ICD-10-CM | POA: Diagnosis not present

## 2022-06-09 DIAGNOSIS — E1122 Type 2 diabetes mellitus with diabetic chronic kidney disease: Secondary | ICD-10-CM | POA: Diagnosis not present

## 2022-06-09 DIAGNOSIS — I129 Hypertensive chronic kidney disease with stage 1 through stage 4 chronic kidney disease, or unspecified chronic kidney disease: Secondary | ICD-10-CM | POA: Diagnosis not present

## 2022-06-09 DIAGNOSIS — N1832 Chronic kidney disease, stage 3b: Secondary | ICD-10-CM | POA: Diagnosis not present

## 2022-06-09 DIAGNOSIS — M5136 Other intervertebral disc degeneration, lumbar region: Secondary | ICD-10-CM | POA: Diagnosis not present

## 2022-06-13 DIAGNOSIS — M5030 Other cervical disc degeneration, unspecified cervical region: Secondary | ICD-10-CM | POA: Diagnosis not present

## 2022-06-13 DIAGNOSIS — N1832 Chronic kidney disease, stage 3b: Secondary | ICD-10-CM | POA: Diagnosis not present

## 2022-06-13 DIAGNOSIS — I129 Hypertensive chronic kidney disease with stage 1 through stage 4 chronic kidney disease, or unspecified chronic kidney disease: Secondary | ICD-10-CM | POA: Diagnosis not present

## 2022-06-13 DIAGNOSIS — D631 Anemia in chronic kidney disease: Secondary | ICD-10-CM | POA: Diagnosis not present

## 2022-06-13 DIAGNOSIS — E1122 Type 2 diabetes mellitus with diabetic chronic kidney disease: Secondary | ICD-10-CM | POA: Diagnosis not present

## 2022-06-13 DIAGNOSIS — M5136 Other intervertebral disc degeneration, lumbar region: Secondary | ICD-10-CM | POA: Diagnosis not present

## 2022-06-16 DIAGNOSIS — D631 Anemia in chronic kidney disease: Secondary | ICD-10-CM | POA: Diagnosis not present

## 2022-06-16 DIAGNOSIS — M5030 Other cervical disc degeneration, unspecified cervical region: Secondary | ICD-10-CM | POA: Diagnosis not present

## 2022-06-16 DIAGNOSIS — I129 Hypertensive chronic kidney disease with stage 1 through stage 4 chronic kidney disease, or unspecified chronic kidney disease: Secondary | ICD-10-CM | POA: Diagnosis not present

## 2022-06-16 DIAGNOSIS — N1832 Chronic kidney disease, stage 3b: Secondary | ICD-10-CM | POA: Diagnosis not present

## 2022-06-16 DIAGNOSIS — E1122 Type 2 diabetes mellitus with diabetic chronic kidney disease: Secondary | ICD-10-CM | POA: Diagnosis not present

## 2022-06-16 DIAGNOSIS — M5136 Other intervertebral disc degeneration, lumbar region: Secondary | ICD-10-CM | POA: Diagnosis not present

## 2022-06-21 DIAGNOSIS — B353 Tinea pedis: Secondary | ICD-10-CM | POA: Diagnosis not present

## 2022-06-21 DIAGNOSIS — E1151 Type 2 diabetes mellitus with diabetic peripheral angiopathy without gangrene: Secondary | ICD-10-CM | POA: Diagnosis not present

## 2022-06-22 DIAGNOSIS — I129 Hypertensive chronic kidney disease with stage 1 through stage 4 chronic kidney disease, or unspecified chronic kidney disease: Secondary | ICD-10-CM | POA: Diagnosis not present

## 2022-06-22 DIAGNOSIS — N1832 Chronic kidney disease, stage 3b: Secondary | ICD-10-CM | POA: Diagnosis not present

## 2022-06-22 DIAGNOSIS — D631 Anemia in chronic kidney disease: Secondary | ICD-10-CM | POA: Diagnosis not present

## 2022-06-22 DIAGNOSIS — M5136 Other intervertebral disc degeneration, lumbar region: Secondary | ICD-10-CM | POA: Diagnosis not present

## 2022-06-22 DIAGNOSIS — M5030 Other cervical disc degeneration, unspecified cervical region: Secondary | ICD-10-CM | POA: Diagnosis not present

## 2022-06-22 DIAGNOSIS — E1122 Type 2 diabetes mellitus with diabetic chronic kidney disease: Secondary | ICD-10-CM | POA: Diagnosis not present

## 2022-06-23 DIAGNOSIS — E1122 Type 2 diabetes mellitus with diabetic chronic kidney disease: Secondary | ICD-10-CM | POA: Diagnosis not present

## 2022-06-23 DIAGNOSIS — M5030 Other cervical disc degeneration, unspecified cervical region: Secondary | ICD-10-CM | POA: Diagnosis not present

## 2022-06-23 DIAGNOSIS — M5136 Other intervertebral disc degeneration, lumbar region: Secondary | ICD-10-CM | POA: Diagnosis not present

## 2022-06-23 DIAGNOSIS — N1832 Chronic kidney disease, stage 3b: Secondary | ICD-10-CM | POA: Diagnosis not present

## 2022-06-23 DIAGNOSIS — I129 Hypertensive chronic kidney disease with stage 1 through stage 4 chronic kidney disease, or unspecified chronic kidney disease: Secondary | ICD-10-CM | POA: Diagnosis not present

## 2022-06-23 DIAGNOSIS — D631 Anemia in chronic kidney disease: Secondary | ICD-10-CM | POA: Diagnosis not present

## 2022-06-27 ENCOUNTER — Encounter (INDEPENDENT_AMBULATORY_CARE_PROVIDER_SITE_OTHER): Payer: Self-pay | Admitting: Ophthalmology

## 2022-06-27 ENCOUNTER — Ambulatory Visit (INDEPENDENT_AMBULATORY_CARE_PROVIDER_SITE_OTHER): Payer: Medicare Other | Admitting: Ophthalmology

## 2022-06-27 DIAGNOSIS — E113551 Type 2 diabetes mellitus with stable proliferative diabetic retinopathy, right eye: Secondary | ICD-10-CM

## 2022-06-27 DIAGNOSIS — H4321 Crystalline deposits in vitreous body, right eye: Secondary | ICD-10-CM

## 2022-06-27 DIAGNOSIS — E113392 Type 2 diabetes mellitus with moderate nonproliferative diabetic retinopathy without macular edema, left eye: Secondary | ICD-10-CM | POA: Diagnosis not present

## 2022-06-27 DIAGNOSIS — H2512 Age-related nuclear cataract, left eye: Secondary | ICD-10-CM | POA: Diagnosis not present

## 2022-06-27 NOTE — Assessment & Plan Note (Signed)
Stable OD, no progression observe

## 2022-06-27 NOTE — Assessment & Plan Note (Signed)
Resolved coincident with vitrectomy

## 2022-06-27 NOTE — Progress Notes (Signed)
06/27/2022     CHIEF COMPLAINT Patient presents for  Chief Complaint  Patient presents with   Diabetic Retinopathy without Macular Edema      HISTORY OF PRESENT ILLNESS: [name] Mathews is a 88 y.o. female who presents to the clinic today for:   HPI   6 MOS for DILATE OU, COLOR FP, OCT. Pt stated vision has not changed since last visit. Pt denies new floaters and FOL. Pts last A1C was 6.1-6.3  Last edited by Angeline Slim on 06/27/2022 10:31 AM.      Referring physician: Antony Contras, MD [address] [place],  [place] [address]  HISTORICAL INFORMATION:   Selected notes from the MEDICAL RECORD NUMBER    Lab Results  Component Value Date   HGBA1C 6.5 (H) 06/06/2019     CURRENT MEDICATIONS: No current outpatient medications on file. (Ophthalmic Drugs)   No current facility-administered medications for this visit. (Ophthalmic Drugs)   Current Outpatient Medications (Other)  Medication Sig   acetaminophen (TYLENOL) 325 MG tablet Take 2 tablets (650 mg total) by mouth every 4 (four) hours as needed for mild pain ((score 1 to 3) or temp > 100.5).   amLODipine (NORVASC) 10 MG tablet Take 1 tablet by mouth once.   amLODipine (NORVASC) 5 MG tablet Take 1 tablet (5 mg total) by mouth daily.   aspirin 81 MG tablet Take 81 mg by mouth daily.   Bacillus Coagulans-Inulin (ALIGN PREBIOTIC-PROBIOTIC PO) Take by mouth.   cefdinir (OMNICEF) 300 MG capsule Take 300 mg by mouth 2 (two) times daily.   Cholecalciferol (VITAMIN D3) 125 MCG (5000 UT) TABS Take 1 tablet (5,000 Units total) by mouth daily.   cyclobenzaprine (FLEXERIL) 10 MG tablet Take 1 tablet (10 mg total) by mouth 3 (three) times daily as needed for muscle spasms.   docusate sodium (COLACE) 250 MG capsule Take 250 mg by mouth daily.   Ferrous Sulfate (SLOW RELEASE IRON PO) Take 1 tablet by mouth daily.   fluticasone (FLONASE) 50 MCG/ACT nasal spray Place 1 spray into both nostrils daily as needed for allergies or rhinitis.    furosemide (LASIX) 20 MG tablet Take 20 mg by mouth daily.   galantamine (RAZADYNE ER) 16 MG 24 hr capsule Take 16 mg by mouth every evening.   hydrALAZINE (APRESOLINE) 25 MG tablet Take 25 mg by mouth 3 (three) times daily.   Lactobacillus-Inulin (PROBIOTIC DIGESTIVE SUPPORT PO) Take 2 each by mouth every morning.   Lancets (ONETOUCH DELICA PLUS LANCET33G) MISC Apply 1 each topically daily.   linaclotide (LINZESS) 145 MCG CAPS capsule Take 145 mcg by mouth every other day.   Multiple Vitamins-Minerals (MULTIVITAMIN PO) Take 2 each by mouth daily.    MYRBETRIQ 50 MG TB24 tablet Take 50 mg by mouth daily.   nitrofurantoin, macrocrystal-monohydrate, (MACROBID) 100 MG capsule Take 100 mg by mouth 2 (two) times daily. For 14 days   ONETOUCH VERIO test strip USE 1 TEST STRIP TO CHECK BLOOD GLUCOSE ONCE A DAY DX  E11.9   pioglitazone (ACTOS) 30 MG tablet Take 1 tablet (30 mg total) by mouth daily.   rosuvastatin (CRESTOR) 5 MG tablet Take 1 tablet (5 mg total) by mouth daily.   telmisartan (MICARDIS) 40 MG tablet Take 40 mg by mouth daily.   telmisartan (MICARDIS) 80 MG tablet Take 80 mg by mouth daily.   vitamin B-12 (CYANOCOBALAMIN) 1000 MCG tablet Take 1,000 mcg by mouth daily. OTC   vitamin C (ASCORBIC ACID) 500 MG  tablet Take 500 mg by mouth daily.   No current facility-administered medications for this visit. (Other)      REVIEW OF SYSTEMS: ROS   Negative for: Constitutional, Gastrointestinal, Neurological, Skin, Genitourinary, Musculoskeletal, HENT, Endocrine, Cardiovascular, Eyes, Respiratory, Psychiatric, Allergic/Imm, Heme/Lymph Last edited by Angeline Slim on 06/27/2022 10:31 AM.       ALLERGIES Allergies  Allergen Reactions   Januvia [Sitagliptin] Other (See Comments)    Numbness in mouth   Simvastatin Other (See Comments)    tremors    PAST MEDICAL HISTORY Past Medical History:  Diagnosis Date   Anemia    hx of   Arthritis    Breast lump    hx of   Cataract    left  eye   Chronic back pain    Diabetes mellitus    Hyperlipidemia    Hypertension    sees Dr. Margret Chance, [facility] internal medicine   Normochromic normocytic anemia 05/22/2017   Hb 10 MCV 85.6 No monoclonal spike on SPEP  Creatinine 1.3-1.6   Peripheral vascular disease (HCC)    Vitreomacular adhesion of right eye 02/03/2020   Vitrectomy, PRP to release VMT, 11-11-2020   Past Surgical History:  Procedure Laterality Date   APPENDECTOMY     BACK SURGERY     BREAST CYST EXCISION Left    BREAST SURGERY     cyst removed left side   BUNIONECTOMY     CARDIOVASCULAR STRESS TEST     COLONOSCOPY     DILATION AND CURETTAGE OF UTERUS     TONSILLECTOMY      FAMILY HISTORY Family History  Problem Relation Age of Onset   Breast cancer Sister 61    SOCIAL HISTORY Social History   Tobacco Use   Smoking status: Never   Smokeless tobacco: Never  Vaping Use   Vaping Use: Never used  Substance Use Topics   Alcohol use: No   Drug use: No         OPHTHALMIC EXAM:  Base Eye Exam     Visual Acuity (ETDRS)       Right Left   Dist cc 20/20 -2 20/30   Dist ph cc  20/25    Correction: Glasses         Tonometry (Tonopen, 10:38 AM)       Right Left   Pressure 15 18         Pupils       Pupils APD   Right PERRL None   Left PERRL None         Visual Fields       Left Right    Full Full         Extraocular Movement       Right Left    Full, Ortho Full, Ortho         Neuro/Psych     Oriented x3: Yes   Mood/Affect: Normal         Dilation     Both eyes: 1.0% Mydriacyl, 2.5% Phenylephrine @ 10:38 AM           Slit Lamp and Fundus Exam     External Exam       Right Left   External Normal Normal         Slit Lamp Exam       Right Left   Lids/Lashes Normal Normal   Conjunctiva/Sclera White and quiet White and quiet   Cornea Clear Clear   Anterior Chamber Deep  and quiet Deep and quiet   Iris Round and reactive Round and  reactive   Lens Centered posterior chamber intraocular lens 2+ Nuclear sclerosis, 2+ Cortical cataract   Anterior Vitreous Normal clear Normal         Fundus Exam       Right Left   Posterior Vitreous Clear, vitrectomized vastly improved Posterior vitreous detachment   Disc Normal Normal   C/D Ratio 0.5 0.45   Macula Macular thickening, Microaneurysms, Mild clinically significant macular edema Microaneurysms   Vessels PDR-quiet NPDR- Moderate   Periphery Good PRP peripheral anterior Normal            IMAGING AND PROCEDURES  Imaging and Procedures for 06/27/22  OCT, Retina - OU - Both Eyes       Right Eye Quality was good. Scan locations included subfoveal. Central Foveal Thickness: 391. Progression has improved. Findings include abnormal foveal contour.   Left Eye Quality was good. Scan locations included subfoveal. Central Foveal Thickness: 256. Progression has been stable.   Notes Minor perifoveal CSME inferior to the fovea yet still overall center involved thickening continues to improve slowly.  Now over 1 year post most recent an eye vegF delivery and post vitrectomy   OS with moderate NPDR but no signs of CSME OS      Color Fundus Photography Optos - OU - Both Eyes       Right Eye Progression has no prior data. Disc findings include normal observations. Macula : microaneurysms.   Left Eye Progression has no prior data. Disc findings include normal observations. Macula : normal observations. Vessels : normal observations. Periphery : normal observations.   Notes PDR OD quiescent, media clear  OS media opacity consistent with cataract              ASSESSMENT/PLAN:  Moderate nonproliferative diabetic retinopathy of left eye (HCC) No signs of progression to severe NPDR, stable overall  Cataract, nuclear sclerotic, left eye Progressive OS now with cortical changes into the line of visual axis particularly anteriorly.  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 Moderate nonproliferative diabetic retinopathy of left eye without macular edema associated with type 2 diabetes mellitus (HCC)  E11.3392 OCT, Retina - OU - Both Eyes    Color Fundus Photography Optos - OU - Both Eyes    2. Cataract, nuclear sclerotic, left eye  H25.12     3. Stable treated proliferative diabetic retinopathy of right eye determined by examination associated with type 2 diabetes mellitus (HCC)  A19.3790     4. Asteroid hyalosis of right eye  H43.21       1.  OD with stable quiescent PDR.  Pseudophakia looks great ,no maculopathy  2.  OS with moderate NPDR, stable.  3.  Reviewed media opacity OS secondary to cortical spoking into the visual axis now with NSC.  Recommend cataract surgery with lens implantation consideration with Dr. Antony Contras prior to patient becoming infirm or nonambulatory for completion of the pseudophakic status  Ophthalmic Meds Ordered this visit:  No orders of the defined types were placed in this encounter.      Return in about 7 months (around 01/26/2023) for DILATE OU, COLOR FP, OCT.  There are no Patient Instructions on file for this visit.   Explained  the diagnoses, plan, and follow up with the patient and they expressed understanding.  Patient expressed understanding of the importance of proper follow up care.   Alford Highland Kamaile Zachow M.D. Diseases & Surgery of the Retina and Vitreous [facility] 06/27/22     Abbreviations: M myopia (nearsighted); A astigmatism; H hyperopia (farsighted); P presbyopia; Mrx spectacle prescription;  CTL contact lenses; OD right eye; OS left eye; OU both eyes  XT exotropia; ET esotropia; PEK punctate epithelial keratitis; PEE punctate epithelial  erosions; DES dry eye syndrome; MGD meibomian gland dysfunction; ATs artificial tears; PFAT's preservative free artificial tears; NSC nuclear sclerotic cataract; PSC posterior subcapsular cataract; ERM epi-retinal membrane; PVD posterior vitreous detachment; RD retinal detachment; DM diabetes mellitus; DR diabetic retinopathy; NPDR non-proliferative diabetic retinopathy; PDR proliferative diabetic retinopathy; CSME clinically significant macular edema; DME diabetic macular edema; dbh dot blot hemorrhages; CWS cotton wool spot; POAG primary open angle glaucoma; C/D cup-to-disc ratio; HVF humphrey visual field; GVF goldmann visual field; OCT optical coherence tomography; IOP intraocular pressure; BRVO Branch retinal vein occlusion; CRVO central retinal vein occlusion; CRAO central retinal artery occlusion; BRAO branch retinal artery occlusion; RT retinal tear; SB scleral buckle; PPV pars plana vitrectomy; VH Vitreous hemorrhage; PRP panretinal laser photocoagulation; IVK intravitreal kenalog; VMT vitreomacular traction; MH Macular hole;  NVD neovascularization of the disc; NVE neovascularization elsewhere; AREDS age related eye disease study; ARMD age related macular degeneration; POAG primary open angle glaucoma; EBMD epithelial/anterior basement membrane dystrophy; ACIOL anterior chamber intraocular lens; IOL intraocular lens; PCIOL posterior chamber intraocular lens; Phaco/IOL phacoemulsification with intraocular lens placement; PRK photorefractive keratectomy; LASIK laser assisted in situ keratomileusis; HTN hypertension; DM diabetes mellitus; COPD chronic obstructive pulmonary disease

## 2022-06-27 NOTE — Assessment & Plan Note (Signed)
Progressive OS now with cortical changes into the line of visual axis particularly anteriorly.

## 2022-06-27 NOTE — Assessment & Plan Note (Signed)
No signs of progression to severe NPDR, stable overall

## 2022-06-30 DIAGNOSIS — I129 Hypertensive chronic kidney disease with stage 1 through stage 4 chronic kidney disease, or unspecified chronic kidney disease: Secondary | ICD-10-CM | POA: Diagnosis not present

## 2022-06-30 DIAGNOSIS — N1832 Chronic kidney disease, stage 3b: Secondary | ICD-10-CM | POA: Diagnosis not present

## 2022-06-30 DIAGNOSIS — M5030 Other cervical disc degeneration, unspecified cervical region: Secondary | ICD-10-CM | POA: Diagnosis not present

## 2022-06-30 DIAGNOSIS — E1122 Type 2 diabetes mellitus with diabetic chronic kidney disease: Secondary | ICD-10-CM | POA: Diagnosis not present

## 2022-06-30 DIAGNOSIS — M5136 Other intervertebral disc degeneration, lumbar region: Secondary | ICD-10-CM | POA: Diagnosis not present

## 2022-06-30 DIAGNOSIS — D631 Anemia in chronic kidney disease: Secondary | ICD-10-CM | POA: Diagnosis not present

## 2022-07-01 DIAGNOSIS — D631 Anemia in chronic kidney disease: Secondary | ICD-10-CM | POA: Diagnosis not present

## 2022-07-01 DIAGNOSIS — M5030 Other cervical disc degeneration, unspecified cervical region: Secondary | ICD-10-CM | POA: Diagnosis not present

## 2022-07-01 DIAGNOSIS — E1122 Type 2 diabetes mellitus with diabetic chronic kidney disease: Secondary | ICD-10-CM | POA: Diagnosis not present

## 2022-07-01 DIAGNOSIS — I129 Hypertensive chronic kidney disease with stage 1 through stage 4 chronic kidney disease, or unspecified chronic kidney disease: Secondary | ICD-10-CM | POA: Diagnosis not present

## 2022-07-01 DIAGNOSIS — N1832 Chronic kidney disease, stage 3b: Secondary | ICD-10-CM | POA: Diagnosis not present

## 2022-07-01 DIAGNOSIS — M5136 Other intervertebral disc degeneration, lumbar region: Secondary | ICD-10-CM | POA: Diagnosis not present

## 2022-07-05 DIAGNOSIS — N3281 Overactive bladder: Secondary | ICD-10-CM | POA: Diagnosis not present

## 2022-07-05 DIAGNOSIS — E785 Hyperlipidemia, unspecified: Secondary | ICD-10-CM | POA: Diagnosis not present

## 2022-07-05 DIAGNOSIS — I129 Hypertensive chronic kidney disease with stage 1 through stage 4 chronic kidney disease, or unspecified chronic kidney disease: Secondary | ICD-10-CM | POA: Diagnosis not present

## 2022-07-05 DIAGNOSIS — G4733 Obstructive sleep apnea (adult) (pediatric): Secondary | ICD-10-CM | POA: Diagnosis not present

## 2022-07-05 DIAGNOSIS — N1832 Chronic kidney disease, stage 3b: Secondary | ICD-10-CM | POA: Diagnosis not present

## 2022-07-05 DIAGNOSIS — Z9181 History of falling: Secondary | ICD-10-CM | POA: Diagnosis not present

## 2022-07-05 DIAGNOSIS — Z79899 Other long term (current) drug therapy: Secondary | ICD-10-CM | POA: Diagnosis not present

## 2022-07-05 DIAGNOSIS — Z7984 Long term (current) use of oral hypoglycemic drugs: Secondary | ICD-10-CM | POA: Diagnosis not present

## 2022-07-05 DIAGNOSIS — M5136 Other intervertebral disc degeneration, lumbar region: Secondary | ICD-10-CM | POA: Diagnosis not present

## 2022-07-05 DIAGNOSIS — M8580 Other specified disorders of bone density and structure, unspecified site: Secondary | ICD-10-CM | POA: Diagnosis not present

## 2022-07-05 DIAGNOSIS — M1990 Unspecified osteoarthritis, unspecified site: Secondary | ICD-10-CM | POA: Diagnosis not present

## 2022-07-05 DIAGNOSIS — Z7982 Long term (current) use of aspirin: Secondary | ICD-10-CM | POA: Diagnosis not present

## 2022-07-05 DIAGNOSIS — E1122 Type 2 diabetes mellitus with diabetic chronic kidney disease: Secondary | ICD-10-CM | POA: Diagnosis not present

## 2022-07-05 DIAGNOSIS — D890 Polyclonal hypergammaglobulinemia: Secondary | ICD-10-CM | POA: Diagnosis not present

## 2022-07-05 DIAGNOSIS — R413 Other amnesia: Secondary | ICD-10-CM | POA: Diagnosis not present

## 2022-07-05 DIAGNOSIS — K5900 Constipation, unspecified: Secondary | ICD-10-CM | POA: Diagnosis not present

## 2022-07-05 DIAGNOSIS — D631 Anemia in chronic kidney disease: Secondary | ICD-10-CM | POA: Diagnosis not present

## 2022-07-05 DIAGNOSIS — M5030 Other cervical disc degeneration, unspecified cervical region: Secondary | ICD-10-CM | POA: Diagnosis not present

## 2022-07-05 DIAGNOSIS — H3530 Unspecified macular degeneration: Secondary | ICD-10-CM | POA: Diagnosis not present

## 2022-07-05 DIAGNOSIS — K219 Gastro-esophageal reflux disease without esophagitis: Secondary | ICD-10-CM | POA: Diagnosis not present

## 2022-07-11 DIAGNOSIS — E1122 Type 2 diabetes mellitus with diabetic chronic kidney disease: Secondary | ICD-10-CM | POA: Diagnosis not present

## 2022-07-11 DIAGNOSIS — D631 Anemia in chronic kidney disease: Secondary | ICD-10-CM | POA: Diagnosis not present

## 2022-07-11 DIAGNOSIS — M5030 Other cervical disc degeneration, unspecified cervical region: Secondary | ICD-10-CM | POA: Diagnosis not present

## 2022-07-11 DIAGNOSIS — M5136 Other intervertebral disc degeneration, lumbar region: Secondary | ICD-10-CM | POA: Diagnosis not present

## 2022-07-11 DIAGNOSIS — N1832 Chronic kidney disease, stage 3b: Secondary | ICD-10-CM | POA: Diagnosis not present

## 2022-07-11 DIAGNOSIS — I129 Hypertensive chronic kidney disease with stage 1 through stage 4 chronic kidney disease, or unspecified chronic kidney disease: Secondary | ICD-10-CM | POA: Diagnosis not present

## 2022-07-15 DIAGNOSIS — I129 Hypertensive chronic kidney disease with stage 1 through stage 4 chronic kidney disease, or unspecified chronic kidney disease: Secondary | ICD-10-CM | POA: Diagnosis not present

## 2022-07-15 DIAGNOSIS — D631 Anemia in chronic kidney disease: Secondary | ICD-10-CM | POA: Diagnosis not present

## 2022-07-15 DIAGNOSIS — M5136 Other intervertebral disc degeneration, lumbar region: Secondary | ICD-10-CM | POA: Diagnosis not present

## 2022-07-15 DIAGNOSIS — E1122 Type 2 diabetes mellitus with diabetic chronic kidney disease: Secondary | ICD-10-CM | POA: Diagnosis not present

## 2022-07-15 DIAGNOSIS — N1832 Chronic kidney disease, stage 3b: Secondary | ICD-10-CM | POA: Diagnosis not present

## 2022-07-15 DIAGNOSIS — M5030 Other cervical disc degeneration, unspecified cervical region: Secondary | ICD-10-CM | POA: Diagnosis not present

## 2022-07-20 DIAGNOSIS — M5136 Other intervertebral disc degeneration, lumbar region: Secondary | ICD-10-CM | POA: Diagnosis not present

## 2022-07-20 DIAGNOSIS — D631 Anemia in chronic kidney disease: Secondary | ICD-10-CM | POA: Diagnosis not present

## 2022-07-20 DIAGNOSIS — I129 Hypertensive chronic kidney disease with stage 1 through stage 4 chronic kidney disease, or unspecified chronic kidney disease: Secondary | ICD-10-CM | POA: Diagnosis not present

## 2022-07-20 DIAGNOSIS — M5030 Other cervical disc degeneration, unspecified cervical region: Secondary | ICD-10-CM | POA: Diagnosis not present

## 2022-07-20 DIAGNOSIS — E1122 Type 2 diabetes mellitus with diabetic chronic kidney disease: Secondary | ICD-10-CM | POA: Diagnosis not present

## 2022-07-20 DIAGNOSIS — N1832 Chronic kidney disease, stage 3b: Secondary | ICD-10-CM | POA: Diagnosis not present

## 2022-07-22 DIAGNOSIS — M5030 Other cervical disc degeneration, unspecified cervical region: Secondary | ICD-10-CM | POA: Diagnosis not present

## 2022-07-22 DIAGNOSIS — M5136 Other intervertebral disc degeneration, lumbar region: Secondary | ICD-10-CM | POA: Diagnosis not present

## 2022-07-22 DIAGNOSIS — N1832 Chronic kidney disease, stage 3b: Secondary | ICD-10-CM | POA: Diagnosis not present

## 2022-07-22 DIAGNOSIS — E1122 Type 2 diabetes mellitus with diabetic chronic kidney disease: Secondary | ICD-10-CM | POA: Diagnosis not present

## 2022-07-22 DIAGNOSIS — I129 Hypertensive chronic kidney disease with stage 1 through stage 4 chronic kidney disease, or unspecified chronic kidney disease: Secondary | ICD-10-CM | POA: Diagnosis not present

## 2022-07-22 DIAGNOSIS — D631 Anemia in chronic kidney disease: Secondary | ICD-10-CM | POA: Diagnosis not present

## 2022-07-29 DIAGNOSIS — N1832 Chronic kidney disease, stage 3b: Secondary | ICD-10-CM | POA: Diagnosis not present

## 2022-07-29 DIAGNOSIS — D631 Anemia in chronic kidney disease: Secondary | ICD-10-CM | POA: Diagnosis not present

## 2022-07-29 DIAGNOSIS — M5136 Other intervertebral disc degeneration, lumbar region: Secondary | ICD-10-CM | POA: Diagnosis not present

## 2022-07-29 DIAGNOSIS — I129 Hypertensive chronic kidney disease with stage 1 through stage 4 chronic kidney disease, or unspecified chronic kidney disease: Secondary | ICD-10-CM | POA: Diagnosis not present

## 2022-07-29 DIAGNOSIS — M5030 Other cervical disc degeneration, unspecified cervical region: Secondary | ICD-10-CM | POA: Diagnosis not present

## 2022-07-29 DIAGNOSIS — E1122 Type 2 diabetes mellitus with diabetic chronic kidney disease: Secondary | ICD-10-CM | POA: Diagnosis not present

## 2022-08-01 DIAGNOSIS — N1832 Chronic kidney disease, stage 3b: Secondary | ICD-10-CM | POA: Diagnosis not present

## 2022-08-01 DIAGNOSIS — M5030 Other cervical disc degeneration, unspecified cervical region: Secondary | ICD-10-CM | POA: Diagnosis not present

## 2022-08-01 DIAGNOSIS — M5136 Other intervertebral disc degeneration, lumbar region: Secondary | ICD-10-CM | POA: Diagnosis not present

## 2022-08-01 DIAGNOSIS — E1122 Type 2 diabetes mellitus with diabetic chronic kidney disease: Secondary | ICD-10-CM | POA: Diagnosis not present

## 2022-08-01 DIAGNOSIS — D631 Anemia in chronic kidney disease: Secondary | ICD-10-CM | POA: Diagnosis not present

## 2022-08-01 DIAGNOSIS — I129 Hypertensive chronic kidney disease with stage 1 through stage 4 chronic kidney disease, or unspecified chronic kidney disease: Secondary | ICD-10-CM | POA: Diagnosis not present

## 2022-08-04 DIAGNOSIS — Z7982 Long term (current) use of aspirin: Secondary | ICD-10-CM | POA: Diagnosis not present

## 2022-08-04 DIAGNOSIS — G4733 Obstructive sleep apnea (adult) (pediatric): Secondary | ICD-10-CM | POA: Diagnosis not present

## 2022-08-04 DIAGNOSIS — Z9181 History of falling: Secondary | ICD-10-CM | POA: Diagnosis not present

## 2022-08-04 DIAGNOSIS — D890 Polyclonal hypergammaglobulinemia: Secondary | ICD-10-CM | POA: Diagnosis not present

## 2022-08-04 DIAGNOSIS — M8580 Other specified disorders of bone density and structure, unspecified site: Secondary | ICD-10-CM | POA: Diagnosis not present

## 2022-08-04 DIAGNOSIS — N1832 Chronic kidney disease, stage 3b: Secondary | ICD-10-CM | POA: Diagnosis not present

## 2022-08-04 DIAGNOSIS — Z7984 Long term (current) use of oral hypoglycemic drugs: Secondary | ICD-10-CM | POA: Diagnosis not present

## 2022-08-04 DIAGNOSIS — M5136 Other intervertebral disc degeneration, lumbar region: Secondary | ICD-10-CM | POA: Diagnosis not present

## 2022-08-04 DIAGNOSIS — E785 Hyperlipidemia, unspecified: Secondary | ICD-10-CM | POA: Diagnosis not present

## 2022-08-04 DIAGNOSIS — N3281 Overactive bladder: Secondary | ICD-10-CM | POA: Diagnosis not present

## 2022-08-04 DIAGNOSIS — R413 Other amnesia: Secondary | ICD-10-CM | POA: Diagnosis not present

## 2022-08-04 DIAGNOSIS — Z79899 Other long term (current) drug therapy: Secondary | ICD-10-CM | POA: Diagnosis not present

## 2022-08-04 DIAGNOSIS — D631 Anemia in chronic kidney disease: Secondary | ICD-10-CM | POA: Diagnosis not present

## 2022-08-04 DIAGNOSIS — M1990 Unspecified osteoarthritis, unspecified site: Secondary | ICD-10-CM | POA: Diagnosis not present

## 2022-08-04 DIAGNOSIS — I129 Hypertensive chronic kidney disease with stage 1 through stage 4 chronic kidney disease, or unspecified chronic kidney disease: Secondary | ICD-10-CM | POA: Diagnosis not present

## 2022-08-04 DIAGNOSIS — E1122 Type 2 diabetes mellitus with diabetic chronic kidney disease: Secondary | ICD-10-CM | POA: Diagnosis not present

## 2022-08-04 DIAGNOSIS — K5900 Constipation, unspecified: Secondary | ICD-10-CM | POA: Diagnosis not present

## 2022-08-04 DIAGNOSIS — K219 Gastro-esophageal reflux disease without esophagitis: Secondary | ICD-10-CM | POA: Diagnosis not present

## 2022-08-04 DIAGNOSIS — H3530 Unspecified macular degeneration: Secondary | ICD-10-CM | POA: Diagnosis not present

## 2022-08-04 DIAGNOSIS — M5030 Other cervical disc degeneration, unspecified cervical region: Secondary | ICD-10-CM | POA: Diagnosis not present

## 2022-08-05 DIAGNOSIS — I129 Hypertensive chronic kidney disease with stage 1 through stage 4 chronic kidney disease, or unspecified chronic kidney disease: Secondary | ICD-10-CM | POA: Diagnosis not present

## 2022-08-05 DIAGNOSIS — M5136 Other intervertebral disc degeneration, lumbar region: Secondary | ICD-10-CM | POA: Diagnosis not present

## 2022-08-05 DIAGNOSIS — N1832 Chronic kidney disease, stage 3b: Secondary | ICD-10-CM | POA: Diagnosis not present

## 2022-08-05 DIAGNOSIS — D631 Anemia in chronic kidney disease: Secondary | ICD-10-CM | POA: Diagnosis not present

## 2022-08-05 DIAGNOSIS — E1122 Type 2 diabetes mellitus with diabetic chronic kidney disease: Secondary | ICD-10-CM | POA: Diagnosis not present

## 2022-08-05 DIAGNOSIS — M5030 Other cervical disc degeneration, unspecified cervical region: Secondary | ICD-10-CM | POA: Diagnosis not present

## 2022-08-09 DIAGNOSIS — M5136 Other intervertebral disc degeneration, lumbar region: Secondary | ICD-10-CM | POA: Diagnosis not present

## 2022-08-09 DIAGNOSIS — I129 Hypertensive chronic kidney disease with stage 1 through stage 4 chronic kidney disease, or unspecified chronic kidney disease: Secondary | ICD-10-CM | POA: Diagnosis not present

## 2022-08-09 DIAGNOSIS — E1122 Type 2 diabetes mellitus with diabetic chronic kidney disease: Secondary | ICD-10-CM | POA: Diagnosis not present

## 2022-08-09 DIAGNOSIS — D631 Anemia in chronic kidney disease: Secondary | ICD-10-CM | POA: Diagnosis not present

## 2022-08-09 DIAGNOSIS — M5030 Other cervical disc degeneration, unspecified cervical region: Secondary | ICD-10-CM | POA: Diagnosis not present

## 2022-08-09 DIAGNOSIS — N1832 Chronic kidney disease, stage 3b: Secondary | ICD-10-CM | POA: Diagnosis not present

## 2022-08-10 DIAGNOSIS — Z23 Encounter for immunization: Secondary | ICD-10-CM | POA: Diagnosis not present

## 2022-08-10 DIAGNOSIS — D649 Anemia, unspecified: Secondary | ICD-10-CM | POA: Diagnosis not present

## 2022-08-10 DIAGNOSIS — E1169 Type 2 diabetes mellitus with other specified complication: Secondary | ICD-10-CM | POA: Diagnosis not present

## 2022-08-10 DIAGNOSIS — D890 Polyclonal hypergammaglobulinemia: Secondary | ICD-10-CM | POA: Diagnosis not present

## 2022-08-10 DIAGNOSIS — I129 Hypertensive chronic kidney disease with stage 1 through stage 4 chronic kidney disease, or unspecified chronic kidney disease: Secondary | ICD-10-CM | POA: Diagnosis not present

## 2022-08-10 DIAGNOSIS — N1832 Chronic kidney disease, stage 3b: Secondary | ICD-10-CM | POA: Diagnosis not present

## 2022-08-10 DIAGNOSIS — R413 Other amnesia: Secondary | ICD-10-CM | POA: Diagnosis not present

## 2022-08-10 DIAGNOSIS — R748 Abnormal levels of other serum enzymes: Secondary | ICD-10-CM | POA: Diagnosis not present

## 2022-08-10 DIAGNOSIS — M8580 Other specified disorders of bone density and structure, unspecified site: Secondary | ICD-10-CM | POA: Diagnosis not present

## 2022-08-15 ENCOUNTER — Other Ambulatory Visit (HOSPITAL_BASED_OUTPATIENT_CLINIC_OR_DEPARTMENT_OTHER): Payer: Self-pay

## 2022-08-15 ENCOUNTER — Encounter: Payer: Self-pay | Admitting: Pulmonary Disease

## 2022-08-15 DIAGNOSIS — B351 Tinea unguium: Secondary | ICD-10-CM | POA: Diagnosis not present

## 2022-08-15 DIAGNOSIS — I739 Peripheral vascular disease, unspecified: Secondary | ICD-10-CM | POA: Diagnosis not present

## 2022-08-15 DIAGNOSIS — B353 Tinea pedis: Secondary | ICD-10-CM | POA: Diagnosis not present

## 2022-08-15 DIAGNOSIS — Z23 Encounter for immunization: Secondary | ICD-10-CM | POA: Diagnosis not present

## 2022-08-15 DIAGNOSIS — E1151 Type 2 diabetes mellitus with diabetic peripheral angiopathy without gangrene: Secondary | ICD-10-CM | POA: Diagnosis not present

## 2022-08-15 MED ORDER — COMIRNATY 30 MCG/0.3ML IM SUSP
INTRAMUSCULAR | 0 refills | Status: DC
  Filled 2022-08-15: qty 0.3, 1d supply, fill #0

## 2022-08-18 DIAGNOSIS — M5136 Other intervertebral disc degeneration, lumbar region: Secondary | ICD-10-CM | POA: Diagnosis not present

## 2022-08-18 DIAGNOSIS — I129 Hypertensive chronic kidney disease with stage 1 through stage 4 chronic kidney disease, or unspecified chronic kidney disease: Secondary | ICD-10-CM | POA: Diagnosis not present

## 2022-08-18 DIAGNOSIS — D631 Anemia in chronic kidney disease: Secondary | ICD-10-CM | POA: Diagnosis not present

## 2022-08-18 DIAGNOSIS — E1122 Type 2 diabetes mellitus with diabetic chronic kidney disease: Secondary | ICD-10-CM | POA: Diagnosis not present

## 2022-08-18 DIAGNOSIS — M5030 Other cervical disc degeneration, unspecified cervical region: Secondary | ICD-10-CM | POA: Diagnosis not present

## 2022-08-18 DIAGNOSIS — N1832 Chronic kidney disease, stage 3b: Secondary | ICD-10-CM | POA: Diagnosis not present

## 2022-08-19 DIAGNOSIS — I129 Hypertensive chronic kidney disease with stage 1 through stage 4 chronic kidney disease, or unspecified chronic kidney disease: Secondary | ICD-10-CM | POA: Diagnosis not present

## 2022-08-19 DIAGNOSIS — D631 Anemia in chronic kidney disease: Secondary | ICD-10-CM | POA: Diagnosis not present

## 2022-08-19 DIAGNOSIS — E1122 Type 2 diabetes mellitus with diabetic chronic kidney disease: Secondary | ICD-10-CM | POA: Diagnosis not present

## 2022-08-19 DIAGNOSIS — M5136 Other intervertebral disc degeneration, lumbar region: Secondary | ICD-10-CM | POA: Diagnosis not present

## 2022-08-19 DIAGNOSIS — M5030 Other cervical disc degeneration, unspecified cervical region: Secondary | ICD-10-CM | POA: Diagnosis not present

## 2022-08-19 DIAGNOSIS — N1832 Chronic kidney disease, stage 3b: Secondary | ICD-10-CM | POA: Diagnosis not present

## 2022-08-22 DIAGNOSIS — D631 Anemia in chronic kidney disease: Secondary | ICD-10-CM | POA: Diagnosis not present

## 2022-08-22 DIAGNOSIS — E1122 Type 2 diabetes mellitus with diabetic chronic kidney disease: Secondary | ICD-10-CM | POA: Diagnosis not present

## 2022-08-22 DIAGNOSIS — M5030 Other cervical disc degeneration, unspecified cervical region: Secondary | ICD-10-CM | POA: Diagnosis not present

## 2022-08-22 DIAGNOSIS — M5136 Other intervertebral disc degeneration, lumbar region: Secondary | ICD-10-CM | POA: Diagnosis not present

## 2022-08-22 DIAGNOSIS — I129 Hypertensive chronic kidney disease with stage 1 through stage 4 chronic kidney disease, or unspecified chronic kidney disease: Secondary | ICD-10-CM | POA: Diagnosis not present

## 2022-08-22 DIAGNOSIS — N1832 Chronic kidney disease, stage 3b: Secondary | ICD-10-CM | POA: Diagnosis not present

## 2022-08-23 ENCOUNTER — Other Ambulatory Visit (HOSPITAL_BASED_OUTPATIENT_CLINIC_OR_DEPARTMENT_OTHER): Payer: Self-pay

## 2022-08-23 ENCOUNTER — Encounter: Payer: Self-pay | Admitting: Pulmonary Disease

## 2022-08-23 MED ORDER — ROSUVASTATIN CALCIUM 5 MG PO TABS
5.0000 mg | ORAL_TABLET | Freq: Every day | ORAL | 4 refills | Status: DC
  Filled 2022-10-12: qty 90, 90d supply, fill #0

## 2022-08-23 MED ORDER — LINACLOTIDE 145 MCG PO CAPS
145.0000 ug | ORAL_CAPSULE | Freq: Every day | ORAL | 4 refills | Status: DC
  Filled 2022-08-23: qty 90, 90d supply, fill #0

## 2022-08-23 MED ORDER — FLUCONAZOLE 200 MG PO TABS: ORAL_TABLET | ORAL | 1 refills | Status: DC

## 2022-08-23 MED ORDER — MONTELUKAST SODIUM 10 MG PO TABS
10.0000 mg | ORAL_TABLET | Freq: Every day | ORAL | 4 refills | Status: DC
  Filled 2022-08-23 – 2022-10-17 (×6): qty 90, 90d supply, fill #0
  Filled 2023-01-18: qty 90, 90d supply, fill #1
  Filled 2023-04-28: qty 90, 90d supply, fill #2
  Filled 2023-07-26: qty 90, 90d supply, fill #3
  Filled ????-??-??: fill #0

## 2022-08-23 MED ORDER — HYDRALAZINE HCL 25 MG PO TABS
25.0000 mg | ORAL_TABLET | Freq: Three times a day (TID) | ORAL | 4 refills | Status: DC
  Filled 2022-08-23 – 2022-09-27 (×2): qty 270, 90d supply, fill #0
  Filled 2023-07-08: qty 270, 90d supply, fill #1

## 2022-08-23 MED ORDER — AMLODIPINE BESYLATE 5 MG PO TABS: 5.0000 mg | ORAL_TABLET | Freq: Every evening | ORAL | 4 refills | Status: DC

## 2022-08-23 MED ORDER — MIRABEGRON ER 50 MG PO TB24
50.0000 mg | ORAL_TABLET | Freq: Every day | ORAL | 4 refills | Status: DC
  Filled 2022-08-23 – 2022-09-27 (×3): qty 90, 90d supply, fill #0
  Filled 2022-12-19: qty 90, 90d supply, fill #1
  Filled 2023-03-30: qty 90, 90d supply, fill #2
  Filled 2023-07-10: qty 90, 90d supply, fill #3

## 2022-08-23 MED ORDER — GALANTAMINE HYDROBROMIDE ER 8 MG PO CP24
8.0000 mg | ORAL_CAPSULE | Freq: Every morning | ORAL | 3 refills | Status: DC
Start: 2022-08-10 — End: 2023-10-27
  Filled 2022-08-23 – 2022-09-27 (×2): qty 90, 90d supply, fill #0
  Filled 2022-12-19: qty 90, 90d supply, fill #1
  Filled 2023-03-30: qty 90, 90d supply, fill #2
  Filled 2023-07-08: qty 90, 90d supply, fill #3

## 2022-08-23 MED ORDER — FUROSEMIDE 20 MG PO TABS
20.0000 mg | ORAL_TABLET | Freq: Every day | ORAL | 4 refills | Status: DC
  Filled 2022-08-23: qty 90, 90d supply, fill #0

## 2022-08-23 MED ORDER — MONTELUKAST SODIUM 10 MG PO TABS: 10.0000 mg | ORAL_TABLET | Freq: Every day | ORAL | 4 refills | Status: DC

## 2022-08-23 MED ORDER — FLUTICASONE PROPIONATE 50 MCG/ACT NA SUSP
NASAL | 3 refills | Status: AC
  Filled 2022-08-23: qty 16, 30d supply, fill #0

## 2022-08-23 MED ORDER — AMLODIPINE BESYLATE 5 MG PO TABS
5.0000 mg | ORAL_TABLET | Freq: Every evening | ORAL | 4 refills | Status: DC
  Filled 2022-08-23 – 2022-11-28 (×2): qty 90, 90d supply, fill #0

## 2022-08-23 MED ORDER — ROSUVASTATIN CALCIUM 5 MG PO TABS
5.0000 mg | ORAL_TABLET | Freq: Every day | ORAL | 4 refills | Status: DC
  Filled 2022-08-23 – 2022-12-19 (×3): qty 90, 90d supply, fill #0
  Filled 2023-05-14: qty 90, 90d supply, fill #1
  Filled 2023-07-26: qty 90, 90d supply, fill #2

## 2022-08-23 MED ORDER — PIOGLITAZONE HCL 30 MG PO TABS
30.0000 mg | ORAL_TABLET | Freq: Every day | ORAL | 4 refills | Status: DC
  Filled 2022-10-12: qty 90, 90d supply, fill #0

## 2022-08-23 MED ORDER — PIOGLITAZONE HCL 30 MG PO TABS
30.0000 mg | ORAL_TABLET | Freq: Every day | ORAL | 4 refills | Status: DC
  Filled 2022-08-23 – 2023-03-30 (×2): qty 90, 90d supply, fill #0

## 2022-08-24 ENCOUNTER — Other Ambulatory Visit (HOSPITAL_BASED_OUTPATIENT_CLINIC_OR_DEPARTMENT_OTHER): Payer: Self-pay

## 2022-09-27 ENCOUNTER — Other Ambulatory Visit (HOSPITAL_BASED_OUTPATIENT_CLINIC_OR_DEPARTMENT_OTHER): Payer: Self-pay

## 2022-10-04 DIAGNOSIS — H25012 Cortical age-related cataract, left eye: Secondary | ICD-10-CM | POA: Diagnosis not present

## 2022-10-04 DIAGNOSIS — H21562 Pupillary abnormality, left eye: Secondary | ICD-10-CM | POA: Diagnosis not present

## 2022-10-04 DIAGNOSIS — H25812 Combined forms of age-related cataract, left eye: Secondary | ICD-10-CM | POA: Diagnosis not present

## 2022-10-04 DIAGNOSIS — Z961 Presence of intraocular lens: Secondary | ICD-10-CM | POA: Diagnosis not present

## 2022-10-04 DIAGNOSIS — H2512 Age-related nuclear cataract, left eye: Secondary | ICD-10-CM | POA: Diagnosis not present

## 2022-10-04 DIAGNOSIS — H269 Unspecified cataract: Secondary | ICD-10-CM | POA: Diagnosis not present

## 2022-10-12 ENCOUNTER — Other Ambulatory Visit (HOSPITAL_BASED_OUTPATIENT_CLINIC_OR_DEPARTMENT_OTHER): Payer: Self-pay

## 2022-10-13 ENCOUNTER — Other Ambulatory Visit (HOSPITAL_BASED_OUTPATIENT_CLINIC_OR_DEPARTMENT_OTHER): Payer: Self-pay

## 2022-10-17 ENCOUNTER — Other Ambulatory Visit: Payer: Self-pay

## 2022-10-19 DIAGNOSIS — E1151 Type 2 diabetes mellitus with diabetic peripheral angiopathy without gangrene: Secondary | ICD-10-CM | POA: Diagnosis not present

## 2022-10-19 DIAGNOSIS — L602 Onychogryphosis: Secondary | ICD-10-CM | POA: Diagnosis not present

## 2022-10-19 DIAGNOSIS — B353 Tinea pedis: Secondary | ICD-10-CM | POA: Diagnosis not present

## 2022-10-19 DIAGNOSIS — I739 Peripheral vascular disease, unspecified: Secondary | ICD-10-CM | POA: Diagnosis not present

## 2022-10-19 DIAGNOSIS — L84 Corns and callosities: Secondary | ICD-10-CM | POA: Diagnosis not present

## 2022-10-25 ENCOUNTER — Other Ambulatory Visit (HOSPITAL_BASED_OUTPATIENT_CLINIC_OR_DEPARTMENT_OTHER): Payer: Self-pay

## 2022-10-25 MED ORDER — AREXVY 120 MCG/0.5ML IM SUSR
INTRAMUSCULAR | 0 refills | Status: DC
  Filled 2022-10-25: qty 0.5, 1d supply, fill #0

## 2022-11-01 ENCOUNTER — Other Ambulatory Visit (HOSPITAL_BASED_OUTPATIENT_CLINIC_OR_DEPARTMENT_OTHER): Payer: Self-pay

## 2022-11-17 ENCOUNTER — Other Ambulatory Visit (HOSPITAL_BASED_OUTPATIENT_CLINIC_OR_DEPARTMENT_OTHER): Payer: Self-pay

## 2022-11-18 ENCOUNTER — Other Ambulatory Visit: Payer: Self-pay | Admitting: Internal Medicine

## 2022-11-18 DIAGNOSIS — Z1231 Encounter for screening mammogram for malignant neoplasm of breast: Secondary | ICD-10-CM

## 2022-11-28 ENCOUNTER — Other Ambulatory Visit (HOSPITAL_BASED_OUTPATIENT_CLINIC_OR_DEPARTMENT_OTHER): Payer: Self-pay

## 2022-12-09 ENCOUNTER — Ambulatory Visit (INDEPENDENT_AMBULATORY_CARE_PROVIDER_SITE_OTHER): Payer: Medicare Other

## 2022-12-09 VITALS — BP 148/76 | HR 71 | Temp 97.5°F | Resp 20 | Ht 69.0 in | Wt 225.0 lb

## 2022-12-09 DIAGNOSIS — M810 Age-related osteoporosis without current pathological fracture: Secondary | ICD-10-CM

## 2022-12-09 MED ORDER — DENOSUMAB 60 MG/ML ~~LOC~~ SOSY
60.0000 mg | PREFILLED_SYRINGE | Freq: Once | SUBCUTANEOUS | Status: AC
  Administered 2022-12-09: 60 mg via SUBCUTANEOUS
  Filled 2022-12-09: qty 1

## 2022-12-09 NOTE — Progress Notes (Signed)
Diagnosis: Osteoporosis  Provider:  Marshell Garfinkel MD  Procedure: Injection  Prolia (Denosumab), Dose: 60 mg, Site: subcutaneous, Number of injections: 1   Discharge: Condition: Good, Destination: Home . AVS Provided  Performed by:  Koren Shiver, RN

## 2022-12-19 ENCOUNTER — Other Ambulatory Visit (HOSPITAL_BASED_OUTPATIENT_CLINIC_OR_DEPARTMENT_OTHER): Payer: Self-pay

## 2022-12-26 DIAGNOSIS — E1151 Type 2 diabetes mellitus with diabetic peripheral angiopathy without gangrene: Secondary | ICD-10-CM | POA: Diagnosis not present

## 2022-12-26 DIAGNOSIS — B353 Tinea pedis: Secondary | ICD-10-CM | POA: Diagnosis not present

## 2022-12-26 DIAGNOSIS — I739 Peripheral vascular disease, unspecified: Secondary | ICD-10-CM | POA: Diagnosis not present

## 2022-12-26 DIAGNOSIS — M205X1 Other deformities of toe(s) (acquired), right foot: Secondary | ICD-10-CM | POA: Diagnosis not present

## 2022-12-26 DIAGNOSIS — B351 Tinea unguium: Secondary | ICD-10-CM | POA: Diagnosis not present

## 2023-01-09 DIAGNOSIS — B351 Tinea unguium: Secondary | ICD-10-CM | POA: Diagnosis not present

## 2023-01-09 DIAGNOSIS — M205X1 Other deformities of toe(s) (acquired), right foot: Secondary | ICD-10-CM | POA: Diagnosis not present

## 2023-01-09 DIAGNOSIS — B353 Tinea pedis: Secondary | ICD-10-CM | POA: Diagnosis not present

## 2023-01-09 DIAGNOSIS — I739 Peripheral vascular disease, unspecified: Secondary | ICD-10-CM | POA: Diagnosis not present

## 2023-01-09 DIAGNOSIS — E1151 Type 2 diabetes mellitus with diabetic peripheral angiopathy without gangrene: Secondary | ICD-10-CM | POA: Diagnosis not present

## 2023-01-10 ENCOUNTER — Ambulatory Visit
Admission: RE | Admit: 2023-01-10 | Discharge: 2023-01-10 | Disposition: A | Discharge status: Home / Self Care | Payer: Medicare Other | Source: Ambulatory Visit | Attending: Internal Medicine | Admitting: Internal Medicine

## 2023-01-10 DIAGNOSIS — Z1231 Encounter for screening mammogram for malignant neoplasm of breast: Secondary | ICD-10-CM

## 2023-01-26 ENCOUNTER — Other Ambulatory Visit (HOSPITAL_BASED_OUTPATIENT_CLINIC_OR_DEPARTMENT_OTHER): Payer: Self-pay

## 2023-01-26 DIAGNOSIS — R059 Cough, unspecified: Secondary | ICD-10-CM | POA: Diagnosis not present

## 2023-01-26 DIAGNOSIS — R509 Fever, unspecified: Secondary | ICD-10-CM | POA: Diagnosis not present

## 2023-01-26 DIAGNOSIS — R5383 Other fatigue: Secondary | ICD-10-CM | POA: Diagnosis not present

## 2023-01-26 DIAGNOSIS — U071 COVID-19: Secondary | ICD-10-CM | POA: Diagnosis not present

## 2023-01-26 DIAGNOSIS — Z1152 Encounter for screening for COVID-19: Secondary | ICD-10-CM | POA: Diagnosis not present

## 2023-01-26 DIAGNOSIS — R638 Other symptoms and signs concerning food and fluid intake: Secondary | ICD-10-CM | POA: Diagnosis not present

## 2023-01-26 DIAGNOSIS — R0981 Nasal congestion: Secondary | ICD-10-CM | POA: Diagnosis not present

## 2023-01-26 MED ORDER — LAGEVRIO 200 MG PO CAPS
4.0000 | ORAL_CAPSULE | Freq: Two times a day (BID) | ORAL | 0 refills | Status: AC
  Filled 2023-01-26: qty 40, 5d supply, fill #0

## 2023-01-30 ENCOUNTER — Encounter (INDEPENDENT_AMBULATORY_CARE_PROVIDER_SITE_OTHER): Payer: Medicare Other | Admitting: Ophthalmology

## 2023-01-31 ENCOUNTER — Other Ambulatory Visit (HOSPITAL_BASED_OUTPATIENT_CLINIC_OR_DEPARTMENT_OTHER): Payer: Self-pay

## 2023-01-31 DIAGNOSIS — E1122 Type 2 diabetes mellitus with diabetic chronic kidney disease: Secondary | ICD-10-CM | POA: Diagnosis not present

## 2023-01-31 DIAGNOSIS — Z0000 Encounter for general adult medical examination without abnormal findings: Secondary | ICD-10-CM | POA: Diagnosis not present

## 2023-01-31 DIAGNOSIS — N1832 Chronic kidney disease, stage 3b: Secondary | ICD-10-CM | POA: Diagnosis not present

## 2023-01-31 DIAGNOSIS — Z1331 Encounter for screening for depression: Secondary | ICD-10-CM | POA: Diagnosis not present

## 2023-01-31 DIAGNOSIS — I129 Hypertensive chronic kidney disease with stage 1 through stage 4 chronic kidney disease, or unspecified chronic kidney disease: Secondary | ICD-10-CM | POA: Diagnosis not present

## 2023-01-31 DIAGNOSIS — I10 Essential (primary) hypertension: Secondary | ICD-10-CM | POA: Diagnosis not present

## 2023-01-31 DIAGNOSIS — Z1339 Encounter for screening examination for other mental health and behavioral disorders: Secondary | ICD-10-CM | POA: Diagnosis not present

## 2023-01-31 DIAGNOSIS — E785 Hyperlipidemia, unspecified: Secondary | ICD-10-CM | POA: Diagnosis not present

## 2023-01-31 DIAGNOSIS — M1990 Unspecified osteoarthritis, unspecified site: Secondary | ICD-10-CM | POA: Diagnosis not present

## 2023-01-31 DIAGNOSIS — M5030 Other cervical disc degeneration, unspecified cervical region: Secondary | ICD-10-CM | POA: Diagnosis not present

## 2023-01-31 DIAGNOSIS — D631 Anemia in chronic kidney disease: Secondary | ICD-10-CM | POA: Diagnosis not present

## 2023-01-31 DIAGNOSIS — Z7409 Other reduced mobility: Secondary | ICD-10-CM | POA: Diagnosis not present

## 2023-01-31 DIAGNOSIS — M5136 Other intervertebral disc degeneration, lumbar region: Secondary | ICD-10-CM | POA: Diagnosis not present

## 2023-01-31 MED ORDER — PIOGLITAZONE HCL 30 MG PO TABS
30.0000 mg | ORAL_TABLET | Freq: Every day | ORAL | 3 refills | Status: DC
  Filled 2023-01-31: qty 90, 90d supply, fill #0
  Filled 2023-05-15 – 2023-07-26 (×2): qty 90, 90d supply, fill #1
  Filled 2023-12-03: qty 90, 90d supply, fill #2

## 2023-01-31 MED ORDER — GALANTAMINE HYDROBROMIDE ER 8 MG PO CP24
8.0000 mg | ORAL_CAPSULE | ORAL | 3 refills | Status: DC
  Filled 2023-01-31: qty 90, 90d supply, fill #0

## 2023-01-31 MED ORDER — ROSUVASTATIN CALCIUM 5 MG PO TABS: 5.0000 mg | ORAL_TABLET | Freq: Every day | ORAL | 3 refills | Status: DC

## 2023-01-31 MED ORDER — MYRBETRIQ 50 MG PO TB24
50.0000 mg | ORAL_TABLET | Freq: Every day | ORAL | 3 refills | Status: DC
  Filled 2023-10-27: qty 90, 90d supply, fill #0

## 2023-01-31 MED ORDER — AMLODIPINE BESYLATE 5 MG PO TABS
5.0000 mg | ORAL_TABLET | Freq: Every evening | ORAL | 3 refills | Status: DC
  Filled 2023-01-31 – 2023-03-30 (×2): qty 90, 90d supply, fill #0
  Filled 2023-07-08: qty 90, 90d supply, fill #1
  Filled 2023-12-03: qty 90, 90d supply, fill #2

## 2023-01-31 MED ORDER — MONTELUKAST SODIUM 10 MG PO TABS: 10.0000 mg | ORAL_TABLET | Freq: Every day | ORAL | 3 refills | Status: DC

## 2023-01-31 MED ORDER — FLUTICASONE PROPIONATE 50 MCG/ACT NA SUSP
2.0000 | Freq: Every day | NASAL | 3 refills | Status: DC
  Filled 2023-01-31: qty 16, 30d supply, fill #0
  Filled 2023-03-30: qty 16, 30d supply, fill #1

## 2023-01-31 MED ORDER — HYDRALAZINE HCL 25 MG PO TABS
25.0000 mg | ORAL_TABLET | Freq: Two times a day (BID) | ORAL | 3 refills | Status: DC
  Filled 2023-01-31: qty 180, 90d supply, fill #0
  Filled 2023-05-15: qty 180, 90d supply, fill #1

## 2023-02-01 ENCOUNTER — Other Ambulatory Visit: Payer: Self-pay

## 2023-02-07 DIAGNOSIS — E113551 Type 2 diabetes mellitus with stable proliferative diabetic retinopathy, right eye: Secondary | ICD-10-CM | POA: Diagnosis not present

## 2023-02-07 DIAGNOSIS — E113392 Type 2 diabetes mellitus with moderate nonproliferative diabetic retinopathy without macular edema, left eye: Secondary | ICD-10-CM | POA: Diagnosis not present

## 2023-03-14 ENCOUNTER — Other Ambulatory Visit: Payer: Self-pay

## 2023-03-30 ENCOUNTER — Other Ambulatory Visit (HOSPITAL_BASED_OUTPATIENT_CLINIC_OR_DEPARTMENT_OTHER): Payer: Self-pay

## 2023-03-31 ENCOUNTER — Other Ambulatory Visit (HOSPITAL_BASED_OUTPATIENT_CLINIC_OR_DEPARTMENT_OTHER): Payer: Self-pay

## 2023-04-01 ENCOUNTER — Other Ambulatory Visit (HOSPITAL_BASED_OUTPATIENT_CLINIC_OR_DEPARTMENT_OTHER): Payer: Self-pay

## 2023-04-04 ENCOUNTER — Other Ambulatory Visit: Payer: Self-pay

## 2023-04-04 ENCOUNTER — Other Ambulatory Visit (HOSPITAL_BASED_OUTPATIENT_CLINIC_OR_DEPARTMENT_OTHER): Payer: Self-pay

## 2023-04-26 ENCOUNTER — Other Ambulatory Visit (HOSPITAL_BASED_OUTPATIENT_CLINIC_OR_DEPARTMENT_OTHER): Payer: Self-pay

## 2023-04-28 ENCOUNTER — Other Ambulatory Visit (HOSPITAL_BASED_OUTPATIENT_CLINIC_OR_DEPARTMENT_OTHER): Payer: Self-pay

## 2023-05-02 DIAGNOSIS — I739 Peripheral vascular disease, unspecified: Secondary | ICD-10-CM | POA: Diagnosis not present

## 2023-05-02 DIAGNOSIS — E1151 Type 2 diabetes mellitus with diabetic peripheral angiopathy without gangrene: Secondary | ICD-10-CM | POA: Diagnosis not present

## 2023-05-02 DIAGNOSIS — B353 Tinea pedis: Secondary | ICD-10-CM | POA: Diagnosis not present

## 2023-05-02 DIAGNOSIS — B351 Tinea unguium: Secondary | ICD-10-CM | POA: Diagnosis not present

## 2023-05-02 DIAGNOSIS — M205X1 Other deformities of toe(s) (acquired), right foot: Secondary | ICD-10-CM | POA: Diagnosis not present

## 2023-05-02 DIAGNOSIS — L603 Nail dystrophy: Secondary | ICD-10-CM | POA: Diagnosis not present

## 2023-05-15 ENCOUNTER — Other Ambulatory Visit (HOSPITAL_BASED_OUTPATIENT_CLINIC_OR_DEPARTMENT_OTHER): Payer: Self-pay

## 2023-06-05 ENCOUNTER — Other Ambulatory Visit (HOSPITAL_BASED_OUTPATIENT_CLINIC_OR_DEPARTMENT_OTHER): Payer: Self-pay

## 2023-06-12 ENCOUNTER — Ambulatory Visit: Payer: Medicare Other

## 2023-06-12 NOTE — Telephone Encounter (Addendum)
Patient continues to have active medicare and supplement coverage. Approval extended to 10/31/23

## 2023-06-19 ENCOUNTER — Ambulatory Visit (INDEPENDENT_AMBULATORY_CARE_PROVIDER_SITE_OTHER): Payer: Medicare Other

## 2023-06-19 VITALS — BP 183/72 | HR 76 | Temp 98.5°F | Resp 18 | Ht 69.5 in | Wt 224.4 lb

## 2023-06-19 DIAGNOSIS — M810 Age-related osteoporosis without current pathological fracture: Secondary | ICD-10-CM | POA: Diagnosis not present

## 2023-06-19 MED ORDER — DENOSUMAB 60 MG/ML ~~LOC~~ SOSY
60.0000 mg | PREFILLED_SYRINGE | Freq: Once | SUBCUTANEOUS | Status: AC
  Administered 2023-06-19: 60 mg via SUBCUTANEOUS
  Filled 2023-06-19: qty 1

## 2023-06-19 NOTE — Progress Notes (Signed)
 Diagnosis: Osteoporosis  Provider:  Chilton Greathouse MD  Procedure: Injection  Prolia (Denosumab), Dose: 60 mg, Site: subcutaneous, Number of injections: 1  Post Care: Patient declined observation  Discharge: Condition: Good, Destination: Home . AVS Declined  Performed by:  Loney Hering, LPN

## 2023-06-20 ENCOUNTER — Ambulatory Visit: Payer: Medicare Other

## 2023-07-04 DIAGNOSIS — L603 Nail dystrophy: Secondary | ICD-10-CM | POA: Diagnosis not present

## 2023-07-04 DIAGNOSIS — I739 Peripheral vascular disease, unspecified: Secondary | ICD-10-CM | POA: Diagnosis not present

## 2023-07-04 DIAGNOSIS — L851 Acquired keratosis [keratoderma] palmaris et plantaris: Secondary | ICD-10-CM | POA: Diagnosis not present

## 2023-07-04 DIAGNOSIS — E1151 Type 2 diabetes mellitus with diabetic peripheral angiopathy without gangrene: Secondary | ICD-10-CM | POA: Diagnosis not present

## 2023-07-04 DIAGNOSIS — B351 Tinea unguium: Secondary | ICD-10-CM | POA: Diagnosis not present

## 2023-07-04 DIAGNOSIS — B353 Tinea pedis: Secondary | ICD-10-CM | POA: Diagnosis not present

## 2023-07-04 DIAGNOSIS — M205X1 Other deformities of toe(s) (acquired), right foot: Secondary | ICD-10-CM | POA: Diagnosis not present

## 2023-07-10 ENCOUNTER — Other Ambulatory Visit (HOSPITAL_BASED_OUTPATIENT_CLINIC_OR_DEPARTMENT_OTHER): Payer: Self-pay

## 2023-07-10 ENCOUNTER — Other Ambulatory Visit: Payer: Self-pay

## 2023-08-09 ENCOUNTER — Other Ambulatory Visit (HOSPITAL_BASED_OUTPATIENT_CLINIC_OR_DEPARTMENT_OTHER): Payer: Self-pay

## 2023-08-09 MED ORDER — COVID-19 MRNA VAC-TRIS(PFIZER) 30 MCG/0.3ML IM SUSY
0.3000 mL | PREFILLED_SYRINGE | Freq: Once | INTRAMUSCULAR | 0 refills | Status: AC
  Filled 2023-08-09: qty 0.3, 1d supply, fill #0

## 2023-08-09 MED ORDER — INFLUENZA VAC A&B SURF ANT ADJ 0.5 ML IM SUSY
0.5000 mL | PREFILLED_SYRINGE | Freq: Once | INTRAMUSCULAR | 0 refills | Status: AC
  Filled 2023-08-09: qty 0.5, 1d supply, fill #0

## 2023-08-11 ENCOUNTER — Other Ambulatory Visit (HOSPITAL_BASED_OUTPATIENT_CLINIC_OR_DEPARTMENT_OTHER): Payer: Self-pay

## 2023-10-05 DIAGNOSIS — H4321 Crystalline deposits in vitreous body, right eye: Secondary | ICD-10-CM | POA: Diagnosis not present

## 2023-10-05 DIAGNOSIS — E113551 Type 2 diabetes mellitus with stable proliferative diabetic retinopathy, right eye: Secondary | ICD-10-CM | POA: Diagnosis not present

## 2023-10-05 DIAGNOSIS — H43822 Vitreomacular adhesion, left eye: Secondary | ICD-10-CM | POA: Diagnosis not present

## 2023-10-05 DIAGNOSIS — E113392 Type 2 diabetes mellitus with moderate nonproliferative diabetic retinopathy without macular edema, left eye: Secondary | ICD-10-CM | POA: Diagnosis not present

## 2023-10-10 ENCOUNTER — Other Ambulatory Visit (HOSPITAL_BASED_OUTPATIENT_CLINIC_OR_DEPARTMENT_OTHER): Payer: Self-pay

## 2023-10-10 ENCOUNTER — Other Ambulatory Visit: Payer: Self-pay

## 2023-10-10 DIAGNOSIS — N3020 Other chronic cystitis without hematuria: Secondary | ICD-10-CM | POA: Diagnosis not present

## 2023-10-10 DIAGNOSIS — K649 Unspecified hemorrhoids: Secondary | ICD-10-CM | POA: Diagnosis not present

## 2023-10-10 MED ORDER — CEPHALEXIN 500 MG PO CAPS
500.0000 mg | ORAL_CAPSULE | Freq: Two times a day (BID) | ORAL | 0 refills | Status: AC
  Filled 2023-10-10: qty 60, 30d supply, fill #0

## 2023-10-10 MED ORDER — HYDROCORTISONE ACETATE 25 MG RE SUPP
25.0000 mg | Freq: Three times a day (TID) | RECTAL | 0 refills | Status: DC
Start: 2023-10-10 — End: 2024-06-07
  Filled 2023-10-10: qty 12, 4d supply, fill #0
  Filled 2023-10-10: qty 30, 10d supply, fill #0
  Filled 2023-10-10: qty 18, 6d supply, fill #0

## 2023-10-11 ENCOUNTER — Other Ambulatory Visit (HOSPITAL_BASED_OUTPATIENT_CLINIC_OR_DEPARTMENT_OTHER): Payer: Self-pay

## 2023-10-12 ENCOUNTER — Encounter: Payer: Self-pay | Admitting: Internal Medicine

## 2023-10-12 ENCOUNTER — Other Ambulatory Visit: Payer: Self-pay

## 2023-10-12 ENCOUNTER — Other Ambulatory Visit (HOSPITAL_BASED_OUTPATIENT_CLINIC_OR_DEPARTMENT_OTHER): Payer: Self-pay

## 2023-10-12 MED ORDER — LIDOCAINE-HYDROCORT (PERIANAL) 3-0.5 % EX CREA
TOPICAL_CREAM | Freq: Two times a day (BID) | CUTANEOUS | 1 refills | Status: DC | PRN
Start: 2023-10-12 — End: 2024-07-15
  Filled 2023-10-12: qty 28.35, 30d supply, fill #0

## 2023-10-13 ENCOUNTER — Other Ambulatory Visit (HOSPITAL_BASED_OUTPATIENT_CLINIC_OR_DEPARTMENT_OTHER): Payer: Self-pay

## 2023-10-16 ENCOUNTER — Other Ambulatory Visit (HOSPITAL_COMMUNITY): Payer: Self-pay

## 2023-10-17 DIAGNOSIS — E785 Hyperlipidemia, unspecified: Secondary | ICD-10-CM | POA: Diagnosis not present

## 2023-10-17 DIAGNOSIS — R413 Other amnesia: Secondary | ICD-10-CM | POA: Diagnosis not present

## 2023-10-17 DIAGNOSIS — R011 Cardiac murmur, unspecified: Secondary | ICD-10-CM | POA: Diagnosis not present

## 2023-10-17 DIAGNOSIS — D631 Anemia in chronic kidney disease: Secondary | ICD-10-CM | POA: Diagnosis not present

## 2023-10-17 DIAGNOSIS — M8580 Other specified disorders of bone density and structure, unspecified site: Secondary | ICD-10-CM | POA: Diagnosis not present

## 2023-10-17 DIAGNOSIS — M1990 Unspecified osteoarthritis, unspecified site: Secondary | ICD-10-CM | POA: Diagnosis not present

## 2023-10-17 DIAGNOSIS — E1122 Type 2 diabetes mellitus with diabetic chronic kidney disease: Secondary | ICD-10-CM | POA: Diagnosis not present

## 2023-10-17 DIAGNOSIS — E1169 Type 2 diabetes mellitus with other specified complication: Secondary | ICD-10-CM | POA: Diagnosis not present

## 2023-10-17 DIAGNOSIS — I129 Hypertensive chronic kidney disease with stage 1 through stage 4 chronic kidney disease, or unspecified chronic kidney disease: Secondary | ICD-10-CM | POA: Diagnosis not present

## 2023-10-17 DIAGNOSIS — D696 Thrombocytopenia, unspecified: Secondary | ICD-10-CM | POA: Diagnosis not present

## 2023-10-17 DIAGNOSIS — N1832 Chronic kidney disease, stage 3b: Secondary | ICD-10-CM | POA: Diagnosis not present

## 2023-10-17 DIAGNOSIS — D649 Anemia, unspecified: Secondary | ICD-10-CM | POA: Diagnosis not present

## 2023-10-17 DIAGNOSIS — D890 Polyclonal hypergammaglobulinemia: Secondary | ICD-10-CM | POA: Diagnosis not present

## 2023-10-19 DIAGNOSIS — M205X1 Other deformities of toe(s) (acquired), right foot: Secondary | ICD-10-CM | POA: Diagnosis not present

## 2023-10-19 DIAGNOSIS — I739 Peripheral vascular disease, unspecified: Secondary | ICD-10-CM | POA: Diagnosis not present

## 2023-10-19 DIAGNOSIS — L603 Nail dystrophy: Secondary | ICD-10-CM | POA: Diagnosis not present

## 2023-10-19 DIAGNOSIS — L84 Corns and callosities: Secondary | ICD-10-CM | POA: Diagnosis not present

## 2023-10-19 DIAGNOSIS — B351 Tinea unguium: Secondary | ICD-10-CM | POA: Diagnosis not present

## 2023-10-19 DIAGNOSIS — B353 Tinea pedis: Secondary | ICD-10-CM | POA: Diagnosis not present

## 2023-10-19 DIAGNOSIS — E1151 Type 2 diabetes mellitus with diabetic peripheral angiopathy without gangrene: Secondary | ICD-10-CM | POA: Diagnosis not present

## 2023-10-26 ENCOUNTER — Other Ambulatory Visit (HOSPITAL_BASED_OUTPATIENT_CLINIC_OR_DEPARTMENT_OTHER): Payer: Self-pay

## 2023-10-27 ENCOUNTER — Other Ambulatory Visit (HOSPITAL_BASED_OUTPATIENT_CLINIC_OR_DEPARTMENT_OTHER): Payer: Self-pay

## 2023-10-27 MED ORDER — GALANTAMINE HYDROBROMIDE ER 8 MG PO CP24
8.0000 mg | ORAL_CAPSULE | ORAL | 3 refills | Status: AC
  Filled 2023-10-27: qty 90, 90d supply, fill #0
  Filled 2024-02-12: qty 90, 90d supply, fill #1
  Filled 2024-05-01: qty 90, 90d supply, fill #2
  Filled 2024-07-30: qty 90, 90d supply, fill #3

## 2023-10-27 MED ORDER — MONTELUKAST SODIUM 10 MG PO TABS
10.0000 mg | ORAL_TABLET | Freq: Every day | ORAL | 4 refills | Status: AC
  Filled 2023-10-27: qty 90, 90d supply, fill #0
  Filled 2024-02-12: qty 90, 90d supply, fill #1
  Filled 2024-05-01: qty 90, 90d supply, fill #2
  Filled 2024-07-30 (×2): qty 90, 90d supply, fill #3

## 2023-10-27 MED ORDER — ROSUVASTATIN CALCIUM 5 MG PO TABS
5.0000 mg | ORAL_TABLET | Freq: Every day | ORAL | 3 refills | Status: AC
  Filled 2023-10-27: qty 90, 90d supply, fill #0
  Filled 2024-02-12: qty 90, 90d supply, fill #1
  Filled 2024-05-01: qty 90, 90d supply, fill #2
  Filled 2024-07-30 (×2): qty 90, 90d supply, fill #3

## 2023-11-04 ENCOUNTER — Other Ambulatory Visit (HOSPITAL_BASED_OUTPATIENT_CLINIC_OR_DEPARTMENT_OTHER): Payer: Self-pay

## 2023-11-08 ENCOUNTER — Other Ambulatory Visit (HOSPITAL_BASED_OUTPATIENT_CLINIC_OR_DEPARTMENT_OTHER): Payer: Self-pay

## 2023-11-08 MED ORDER — FUROSEMIDE 20 MG PO TABS
20.0000 mg | ORAL_TABLET | ORAL | 3 refills | Status: DC
  Filled 2023-11-08: qty 45, 90d supply, fill #0

## 2023-11-09 ENCOUNTER — Telehealth: Payer: Self-pay

## 2023-11-09 ENCOUNTER — Other Ambulatory Visit (HOSPITAL_BASED_OUTPATIENT_CLINIC_OR_DEPARTMENT_OTHER): Payer: Self-pay

## 2023-11-09 MED ORDER — NEBIVOLOL HCL 5 MG PO TABS
5.0000 mg | ORAL_TABLET | Freq: Every day | ORAL | 11 refills | Status: DC
  Filled 2023-11-09: qty 30, 30d supply, fill #0
  Filled 2023-12-24: qty 30, 30d supply, fill #1
  Filled 2024-01-25: qty 30, 30d supply, fill #2
  Filled 2024-02-19: qty 90, 90d supply, fill #3
  Filled 2024-07-30 (×2): qty 90, 90d supply, fill #4
  Filled 2024-10-03 – 2024-10-07 (×2): qty 90, 90d supply, fill #5

## 2023-11-09 NOTE — Telephone Encounter (Signed)
  Auth Submission: no auth needed Payer: medicare a/b & atena Medication & CPT/J Code(s) submitted: Prolia  (Denosumab ) [id number] Route of submission (phone, fax, portal):  Auth type: Buy/Bill Units/visits requested: 60mg  x 2 doses Reference number:  Approval from: 05/20/22 to 11/30/24

## 2023-11-13 DIAGNOSIS — I129 Hypertensive chronic kidney disease with stage 1 through stage 4 chronic kidney disease, or unspecified chronic kidney disease: Secondary | ICD-10-CM | POA: Diagnosis not present

## 2023-11-13 DIAGNOSIS — K5900 Constipation, unspecified: Secondary | ICD-10-CM | POA: Diagnosis not present

## 2023-11-13 DIAGNOSIS — N1832 Chronic kidney disease, stage 3b: Secondary | ICD-10-CM | POA: Diagnosis not present

## 2023-11-13 DIAGNOSIS — R011 Cardiac murmur, unspecified: Secondary | ICD-10-CM | POA: Diagnosis not present

## 2023-11-13 DIAGNOSIS — N390 Urinary tract infection, site not specified: Secondary | ICD-10-CM | POA: Diagnosis not present

## 2023-11-13 DIAGNOSIS — R4586 Emotional lability: Secondary | ICD-10-CM | POA: Diagnosis not present

## 2023-11-13 DIAGNOSIS — N3281 Overactive bladder: Secondary | ICD-10-CM | POA: Diagnosis not present

## 2023-11-13 DIAGNOSIS — D649 Anemia, unspecified: Secondary | ICD-10-CM | POA: Diagnosis not present

## 2023-11-13 DIAGNOSIS — R413 Other amnesia: Secondary | ICD-10-CM | POA: Diagnosis not present

## 2023-11-30 ENCOUNTER — Other Ambulatory Visit: Payer: Self-pay | Admitting: Internal Medicine

## 2023-11-30 DIAGNOSIS — Z1231 Encounter for screening mammogram for malignant neoplasm of breast: Secondary | ICD-10-CM

## 2023-12-03 ENCOUNTER — Other Ambulatory Visit (HOSPITAL_BASED_OUTPATIENT_CLINIC_OR_DEPARTMENT_OTHER): Payer: Self-pay

## 2023-12-04 ENCOUNTER — Other Ambulatory Visit (HOSPITAL_BASED_OUTPATIENT_CLINIC_OR_DEPARTMENT_OTHER): Payer: Self-pay

## 2023-12-04 MED ORDER — HYDRALAZINE HCL 25 MG PO TABS
25.0000 mg | ORAL_TABLET | Freq: Two times a day (BID) | ORAL | 3 refills | Status: DC
  Filled 2023-12-04: qty 180, 90d supply, fill #0
  Filled 2024-02-19: qty 180, 90d supply, fill #1
  Filled 2024-05-01: qty 180, 90d supply, fill #2

## 2023-12-05 ENCOUNTER — Other Ambulatory Visit (HOSPITAL_BASED_OUTPATIENT_CLINIC_OR_DEPARTMENT_OTHER): Payer: Self-pay

## 2023-12-05 MED ORDER — LINZESS 145 MCG PO CAPS
145.0000 ug | ORAL_CAPSULE | Freq: Every day | ORAL | 4 refills | Status: DC
  Filled 2023-12-05: qty 90, 90d supply, fill #0
  Filled 2024-02-19: qty 90, 90d supply, fill #1

## 2023-12-07 ENCOUNTER — Other Ambulatory Visit (HOSPITAL_BASED_OUTPATIENT_CLINIC_OR_DEPARTMENT_OTHER): Payer: Self-pay

## 2023-12-14 ENCOUNTER — Encounter: Payer: Self-pay | Admitting: Internal Medicine

## 2023-12-19 ENCOUNTER — Telehealth: Payer: Self-pay

## 2023-12-19 NOTE — Telephone Encounter (Signed)
Dr. Waynard Edwards, patient will be scheduled as soon as possible.  Auth Submission: APPROVED Site of care: Site of care: [facility] Payer: [employer] medicare Medication & CPT/J Code(s) submitted: Prolia (Denosumab) [id number] Route of submission (phone, fax, portal): portal Phone # Fax # Auth type: Buy/Bill PB Units/visits requested: 60mg  x 2 doses Reference number: [id number] Approval from: 12/15/23 to 12/13/24

## 2023-12-21 ENCOUNTER — Ambulatory Visit: Payer: Medicare Other

## 2023-12-25 ENCOUNTER — Ambulatory Visit (INDEPENDENT_AMBULATORY_CARE_PROVIDER_SITE_OTHER): Payer: Medicare Other

## 2023-12-25 VITALS — BP 176/62 | HR 74 | Temp 98.9°F | Resp 22 | Ht 68.0 in | Wt 226.6 lb

## 2023-12-25 DIAGNOSIS — L603 Nail dystrophy: Secondary | ICD-10-CM | POA: Diagnosis not present

## 2023-12-25 DIAGNOSIS — B351 Tinea unguium: Secondary | ICD-10-CM | POA: Diagnosis not present

## 2023-12-25 DIAGNOSIS — I739 Peripheral vascular disease, unspecified: Secondary | ICD-10-CM | POA: Diagnosis not present

## 2023-12-25 DIAGNOSIS — E1151 Type 2 diabetes mellitus with diabetic peripheral angiopathy without gangrene: Secondary | ICD-10-CM | POA: Diagnosis not present

## 2023-12-25 DIAGNOSIS — B353 Tinea pedis: Secondary | ICD-10-CM | POA: Diagnosis not present

## 2023-12-25 DIAGNOSIS — L84 Corns and callosities: Secondary | ICD-10-CM | POA: Diagnosis not present

## 2023-12-25 DIAGNOSIS — M810 Age-related osteoporosis without current pathological fracture: Secondary | ICD-10-CM | POA: Diagnosis not present

## 2023-12-25 DIAGNOSIS — M205X1 Other deformities of toe(s) (acquired), right foot: Secondary | ICD-10-CM | POA: Diagnosis not present

## 2023-12-25 MED ORDER — DENOSUMAB 60 MG/ML ~~LOC~~ SOSY
60.0000 mg | PREFILLED_SYRINGE | Freq: Once | SUBCUTANEOUS | Status: AC
  Administered 2023-12-25: 60 mg via SUBCUTANEOUS

## 2023-12-25 NOTE — Progress Notes (Signed)
 Diagnosis: Osteoporosis  Provider:  Chilton Greathouse MD  Procedure: Injection  Prolia (denosumab), Dose: 60 mg, Site: subcutaneous, Number of injections: 1  Injection Site(s): Right arm  Post Care: Patient declined observation  Discharge: Condition: Good, Destination: Home . AVS Declined  Performed by:  Wyvonne Lenz, RN

## 2024-01-03 ENCOUNTER — Ambulatory Visit: Payer: Medicare Other | Admitting: Internal Medicine

## 2024-01-14 DIAGNOSIS — Z556 Problems related to health literacy: Secondary | ICD-10-CM | POA: Diagnosis not present

## 2024-01-14 DIAGNOSIS — D631 Anemia in chronic kidney disease: Secondary | ICD-10-CM | POA: Diagnosis not present

## 2024-01-14 DIAGNOSIS — N1832 Chronic kidney disease, stage 3b: Secondary | ICD-10-CM | POA: Diagnosis not present

## 2024-01-14 DIAGNOSIS — K5909 Other constipation: Secondary | ICD-10-CM | POA: Diagnosis not present

## 2024-01-14 DIAGNOSIS — D696 Thrombocytopenia, unspecified: Secondary | ICD-10-CM | POA: Diagnosis not present

## 2024-01-14 DIAGNOSIS — R413 Other amnesia: Secondary | ICD-10-CM | POA: Diagnosis not present

## 2024-01-14 DIAGNOSIS — E1122 Type 2 diabetes mellitus with diabetic chronic kidney disease: Secondary | ICD-10-CM | POA: Diagnosis not present

## 2024-01-14 DIAGNOSIS — E1169 Type 2 diabetes mellitus with other specified complication: Secondary | ICD-10-CM | POA: Diagnosis not present

## 2024-01-14 DIAGNOSIS — N3281 Overactive bladder: Secondary | ICD-10-CM | POA: Diagnosis not present

## 2024-01-14 DIAGNOSIS — Z7982 Long term (current) use of aspirin: Secondary | ICD-10-CM | POA: Diagnosis not present

## 2024-01-14 DIAGNOSIS — E785 Hyperlipidemia, unspecified: Secondary | ICD-10-CM | POA: Diagnosis not present

## 2024-01-14 DIAGNOSIS — Z87440 Personal history of urinary (tract) infections: Secondary | ICD-10-CM | POA: Diagnosis not present

## 2024-01-14 DIAGNOSIS — H3530 Unspecified macular degeneration: Secondary | ICD-10-CM | POA: Diagnosis not present

## 2024-01-14 DIAGNOSIS — R4586 Emotional lability: Secondary | ICD-10-CM | POA: Diagnosis not present

## 2024-01-14 DIAGNOSIS — I129 Hypertensive chronic kidney disease with stage 1 through stage 4 chronic kidney disease, or unspecified chronic kidney disease: Secondary | ICD-10-CM | POA: Diagnosis not present

## 2024-01-18 DIAGNOSIS — K5909 Other constipation: Secondary | ICD-10-CM | POA: Diagnosis not present

## 2024-01-18 DIAGNOSIS — D696 Thrombocytopenia, unspecified: Secondary | ICD-10-CM | POA: Diagnosis not present

## 2024-01-18 DIAGNOSIS — E1169 Type 2 diabetes mellitus with other specified complication: Secondary | ICD-10-CM | POA: Diagnosis not present

## 2024-01-18 DIAGNOSIS — E1122 Type 2 diabetes mellitus with diabetic chronic kidney disease: Secondary | ICD-10-CM | POA: Diagnosis not present

## 2024-01-18 DIAGNOSIS — N1832 Chronic kidney disease, stage 3b: Secondary | ICD-10-CM | POA: Diagnosis not present

## 2024-01-18 DIAGNOSIS — I129 Hypertensive chronic kidney disease with stage 1 through stage 4 chronic kidney disease, or unspecified chronic kidney disease: Secondary | ICD-10-CM | POA: Diagnosis not present

## 2024-01-22 ENCOUNTER — Encounter: Payer: Self-pay | Admitting: Internal Medicine

## 2024-01-22 DIAGNOSIS — D696 Thrombocytopenia, unspecified: Secondary | ICD-10-CM | POA: Diagnosis not present

## 2024-01-22 DIAGNOSIS — N1832 Chronic kidney disease, stage 3b: Secondary | ICD-10-CM | POA: Diagnosis not present

## 2024-01-22 DIAGNOSIS — E1122 Type 2 diabetes mellitus with diabetic chronic kidney disease: Secondary | ICD-10-CM | POA: Diagnosis not present

## 2024-01-22 DIAGNOSIS — I129 Hypertensive chronic kidney disease with stage 1 through stage 4 chronic kidney disease, or unspecified chronic kidney disease: Secondary | ICD-10-CM | POA: Diagnosis not present

## 2024-01-22 DIAGNOSIS — E1169 Type 2 diabetes mellitus with other specified complication: Secondary | ICD-10-CM | POA: Diagnosis not present

## 2024-01-22 DIAGNOSIS — K5909 Other constipation: Secondary | ICD-10-CM | POA: Diagnosis not present

## 2024-01-25 ENCOUNTER — Other Ambulatory Visit (HOSPITAL_BASED_OUTPATIENT_CLINIC_OR_DEPARTMENT_OTHER): Payer: Self-pay

## 2024-01-25 DIAGNOSIS — N1832 Chronic kidney disease, stage 3b: Secondary | ICD-10-CM | POA: Diagnosis not present

## 2024-01-25 DIAGNOSIS — E1169 Type 2 diabetes mellitus with other specified complication: Secondary | ICD-10-CM | POA: Diagnosis not present

## 2024-01-25 DIAGNOSIS — I129 Hypertensive chronic kidney disease with stage 1 through stage 4 chronic kidney disease, or unspecified chronic kidney disease: Secondary | ICD-10-CM | POA: Diagnosis not present

## 2024-01-25 DIAGNOSIS — D696 Thrombocytopenia, unspecified: Secondary | ICD-10-CM | POA: Diagnosis not present

## 2024-01-25 DIAGNOSIS — E1122 Type 2 diabetes mellitus with diabetic chronic kidney disease: Secondary | ICD-10-CM | POA: Diagnosis not present

## 2024-01-25 DIAGNOSIS — K5909 Other constipation: Secondary | ICD-10-CM | POA: Diagnosis not present

## 2024-01-29 ENCOUNTER — Other Ambulatory Visit (HOSPITAL_BASED_OUTPATIENT_CLINIC_OR_DEPARTMENT_OTHER): Payer: Self-pay

## 2024-02-06 ENCOUNTER — Ambulatory Visit: Payer: Medicare Other | Admitting: Internal Medicine

## 2024-02-06 ENCOUNTER — Ambulatory Visit: Payer: Medicare Other

## 2024-02-12 ENCOUNTER — Other Ambulatory Visit (HOSPITAL_BASED_OUTPATIENT_CLINIC_OR_DEPARTMENT_OTHER): Payer: Self-pay

## 2024-02-12 DIAGNOSIS — K5909 Other constipation: Secondary | ICD-10-CM | POA: Diagnosis not present

## 2024-02-12 DIAGNOSIS — E1169 Type 2 diabetes mellitus with other specified complication: Secondary | ICD-10-CM | POA: Diagnosis not present

## 2024-02-12 DIAGNOSIS — D696 Thrombocytopenia, unspecified: Secondary | ICD-10-CM | POA: Diagnosis not present

## 2024-02-12 DIAGNOSIS — I129 Hypertensive chronic kidney disease with stage 1 through stage 4 chronic kidney disease, or unspecified chronic kidney disease: Secondary | ICD-10-CM | POA: Diagnosis not present

## 2024-02-12 DIAGNOSIS — N1832 Chronic kidney disease, stage 3b: Secondary | ICD-10-CM | POA: Diagnosis not present

## 2024-02-12 DIAGNOSIS — E1122 Type 2 diabetes mellitus with diabetic chronic kidney disease: Secondary | ICD-10-CM | POA: Diagnosis not present

## 2024-02-12 MED ORDER — MIRABEGRON ER 50 MG PO TB24
50.0000 mg | ORAL_TABLET | Freq: Every day | ORAL | 3 refills | Status: DC
  Filled 2024-02-12: qty 90, 90d supply, fill #0
  Filled 2024-05-01: qty 90, 90d supply, fill #1

## 2024-02-12 MED ORDER — PIOGLITAZONE HCL 30 MG PO TABS
30.0000 mg | ORAL_TABLET | Freq: Every day | ORAL | 3 refills | Status: DC
Start: 2024-02-12 — End: 2024-04-30
  Filled 2024-02-12: qty 90, 90d supply, fill #0

## 2024-02-13 ENCOUNTER — Ambulatory Visit

## 2024-02-13 DIAGNOSIS — Z87440 Personal history of urinary (tract) infections: Secondary | ICD-10-CM | POA: Diagnosis not present

## 2024-02-13 DIAGNOSIS — Z7982 Long term (current) use of aspirin: Secondary | ICD-10-CM | POA: Diagnosis not present

## 2024-02-13 DIAGNOSIS — N1832 Chronic kidney disease, stage 3b: Secondary | ICD-10-CM | POA: Diagnosis not present

## 2024-02-13 DIAGNOSIS — E1122 Type 2 diabetes mellitus with diabetic chronic kidney disease: Secondary | ICD-10-CM | POA: Diagnosis not present

## 2024-02-13 DIAGNOSIS — E1169 Type 2 diabetes mellitus with other specified complication: Secondary | ICD-10-CM | POA: Diagnosis not present

## 2024-02-13 DIAGNOSIS — R4586 Emotional lability: Secondary | ICD-10-CM | POA: Diagnosis not present

## 2024-02-13 DIAGNOSIS — N3281 Overactive bladder: Secondary | ICD-10-CM | POA: Diagnosis not present

## 2024-02-13 DIAGNOSIS — K5909 Other constipation: Secondary | ICD-10-CM | POA: Diagnosis not present

## 2024-02-13 DIAGNOSIS — H3530 Unspecified macular degeneration: Secondary | ICD-10-CM | POA: Diagnosis not present

## 2024-02-13 DIAGNOSIS — D696 Thrombocytopenia, unspecified: Secondary | ICD-10-CM | POA: Diagnosis not present

## 2024-02-13 DIAGNOSIS — I129 Hypertensive chronic kidney disease with stage 1 through stage 4 chronic kidney disease, or unspecified chronic kidney disease: Secondary | ICD-10-CM | POA: Diagnosis not present

## 2024-02-13 DIAGNOSIS — E785 Hyperlipidemia, unspecified: Secondary | ICD-10-CM | POA: Diagnosis not present

## 2024-02-13 DIAGNOSIS — R413 Other amnesia: Secondary | ICD-10-CM | POA: Diagnosis not present

## 2024-02-13 DIAGNOSIS — D631 Anemia in chronic kidney disease: Secondary | ICD-10-CM | POA: Diagnosis not present

## 2024-02-13 DIAGNOSIS — Z556 Problems related to health literacy: Secondary | ICD-10-CM | POA: Diagnosis not present

## 2024-02-19 ENCOUNTER — Other Ambulatory Visit (HOSPITAL_BASED_OUTPATIENT_CLINIC_OR_DEPARTMENT_OTHER): Payer: Self-pay

## 2024-02-19 DIAGNOSIS — E1169 Type 2 diabetes mellitus with other specified complication: Secondary | ICD-10-CM | POA: Diagnosis not present

## 2024-02-19 DIAGNOSIS — D696 Thrombocytopenia, unspecified: Secondary | ICD-10-CM | POA: Diagnosis not present

## 2024-02-19 DIAGNOSIS — K5909 Other constipation: Secondary | ICD-10-CM | POA: Diagnosis not present

## 2024-02-19 DIAGNOSIS — N1832 Chronic kidney disease, stage 3b: Secondary | ICD-10-CM | POA: Diagnosis not present

## 2024-02-19 DIAGNOSIS — E1122 Type 2 diabetes mellitus with diabetic chronic kidney disease: Secondary | ICD-10-CM | POA: Diagnosis not present

## 2024-02-19 DIAGNOSIS — I129 Hypertensive chronic kidney disease with stage 1 through stage 4 chronic kidney disease, or unspecified chronic kidney disease: Secondary | ICD-10-CM | POA: Diagnosis not present

## 2024-02-19 MED ORDER — AMLODIPINE BESYLATE 5 MG PO TABS
5.0000 mg | ORAL_TABLET | Freq: Every evening | ORAL | 3 refills | Status: DC
  Filled 2024-02-19: qty 90, 90d supply, fill #0

## 2024-02-21 ENCOUNTER — Encounter: Payer: Self-pay | Admitting: Internal Medicine

## 2024-02-21 ENCOUNTER — Ambulatory Visit
Admission: RE | Admit: 2024-02-21 | Discharge: 2024-02-21 | Disposition: A | Discharge status: Home / Self Care | Source: Ambulatory Visit | Attending: Internal Medicine | Admitting: Internal Medicine

## 2024-02-21 DIAGNOSIS — Z1231 Encounter for screening mammogram for malignant neoplasm of breast: Secondary | ICD-10-CM

## 2024-02-21 DIAGNOSIS — L84 Corns and callosities: Secondary | ICD-10-CM | POA: Diagnosis not present

## 2024-02-21 DIAGNOSIS — M205X1 Other deformities of toe(s) (acquired), right foot: Secondary | ICD-10-CM | POA: Diagnosis not present

## 2024-02-21 DIAGNOSIS — E1151 Type 2 diabetes mellitus with diabetic peripheral angiopathy without gangrene: Secondary | ICD-10-CM | POA: Diagnosis not present

## 2024-02-21 DIAGNOSIS — I739 Peripheral vascular disease, unspecified: Secondary | ICD-10-CM | POA: Diagnosis not present

## 2024-02-21 DIAGNOSIS — B351 Tinea unguium: Secondary | ICD-10-CM | POA: Diagnosis not present

## 2024-02-21 DIAGNOSIS — L603 Nail dystrophy: Secondary | ICD-10-CM | POA: Diagnosis not present

## 2024-02-21 DIAGNOSIS — M792 Neuralgia and neuritis, unspecified: Secondary | ICD-10-CM | POA: Diagnosis not present

## 2024-02-21 DIAGNOSIS — B353 Tinea pedis: Secondary | ICD-10-CM | POA: Diagnosis not present

## 2024-02-22 DIAGNOSIS — N1832 Chronic kidney disease, stage 3b: Secondary | ICD-10-CM | POA: Diagnosis not present

## 2024-02-22 DIAGNOSIS — K5909 Other constipation: Secondary | ICD-10-CM | POA: Diagnosis not present

## 2024-02-22 DIAGNOSIS — D696 Thrombocytopenia, unspecified: Secondary | ICD-10-CM | POA: Diagnosis not present

## 2024-02-22 DIAGNOSIS — I129 Hypertensive chronic kidney disease with stage 1 through stage 4 chronic kidney disease, or unspecified chronic kidney disease: Secondary | ICD-10-CM | POA: Diagnosis not present

## 2024-02-22 DIAGNOSIS — E1122 Type 2 diabetes mellitus with diabetic chronic kidney disease: Secondary | ICD-10-CM | POA: Diagnosis not present

## 2024-02-22 DIAGNOSIS — E1169 Type 2 diabetes mellitus with other specified complication: Secondary | ICD-10-CM | POA: Diagnosis not present

## 2024-02-29 DIAGNOSIS — I10 Essential (primary) hypertension: Secondary | ICD-10-CM | POA: Diagnosis not present

## 2024-02-29 DIAGNOSIS — N3281 Overactive bladder: Secondary | ICD-10-CM | POA: Diagnosis not present

## 2024-02-29 DIAGNOSIS — M1990 Unspecified osteoarthritis, unspecified site: Secondary | ICD-10-CM | POA: Diagnosis not present

## 2024-02-29 DIAGNOSIS — M8580 Other specified disorders of bone density and structure, unspecified site: Secondary | ICD-10-CM | POA: Diagnosis not present

## 2024-02-29 DIAGNOSIS — M5030 Other cervical disc degeneration, unspecified cervical region: Secondary | ICD-10-CM | POA: Diagnosis not present

## 2024-02-29 DIAGNOSIS — Z7409 Other reduced mobility: Secondary | ICD-10-CM | POA: Diagnosis not present

## 2024-02-29 DIAGNOSIS — R269 Unspecified abnormalities of gait and mobility: Secondary | ICD-10-CM | POA: Diagnosis not present

## 2024-02-29 DIAGNOSIS — R413 Other amnesia: Secondary | ICD-10-CM | POA: Diagnosis not present

## 2024-02-29 DIAGNOSIS — Z789 Other specified health status: Secondary | ICD-10-CM | POA: Diagnosis not present

## 2024-03-03 DIAGNOSIS — Z7982 Long term (current) use of aspirin: Secondary | ICD-10-CM | POA: Diagnosis not present

## 2024-03-03 DIAGNOSIS — Z79899 Other long term (current) drug therapy: Secondary | ICD-10-CM | POA: Diagnosis not present

## 2024-03-03 DIAGNOSIS — I10 Essential (primary) hypertension: Secondary | ICD-10-CM | POA: Diagnosis not present

## 2024-03-03 DIAGNOSIS — N3281 Overactive bladder: Secondary | ICD-10-CM | POA: Diagnosis not present

## 2024-03-03 DIAGNOSIS — M1990 Unspecified osteoarthritis, unspecified site: Secondary | ICD-10-CM | POA: Diagnosis not present

## 2024-03-03 DIAGNOSIS — G8929 Other chronic pain: Secondary | ICD-10-CM | POA: Diagnosis not present

## 2024-03-03 DIAGNOSIS — F0390 Unspecified dementia without behavioral disturbance: Secondary | ICD-10-CM | POA: Diagnosis not present

## 2024-03-03 DIAGNOSIS — Z556 Problems related to health literacy: Secondary | ICD-10-CM | POA: Diagnosis not present

## 2024-03-03 DIAGNOSIS — Z860100 Personal history of colon polyps, unspecified: Secondary | ICD-10-CM | POA: Diagnosis not present

## 2024-03-03 DIAGNOSIS — M5030 Other cervical disc degeneration, unspecified cervical region: Secondary | ICD-10-CM | POA: Diagnosis not present

## 2024-03-03 DIAGNOSIS — M545 Low back pain, unspecified: Secondary | ICD-10-CM | POA: Diagnosis not present

## 2024-03-03 DIAGNOSIS — J309 Allergic rhinitis, unspecified: Secondary | ICD-10-CM | POA: Diagnosis not present

## 2024-03-03 DIAGNOSIS — D649 Anemia, unspecified: Secondary | ICD-10-CM | POA: Diagnosis not present

## 2024-03-03 DIAGNOSIS — E785 Hyperlipidemia, unspecified: Secondary | ICD-10-CM | POA: Diagnosis not present

## 2024-03-03 DIAGNOSIS — G4733 Obstructive sleep apnea (adult) (pediatric): Secondary | ICD-10-CM | POA: Diagnosis not present

## 2024-03-03 DIAGNOSIS — H3530 Unspecified macular degeneration: Secondary | ICD-10-CM | POA: Diagnosis not present

## 2024-03-03 DIAGNOSIS — E119 Type 2 diabetes mellitus without complications: Secondary | ICD-10-CM | POA: Diagnosis not present

## 2024-03-03 DIAGNOSIS — M810 Age-related osteoporosis without current pathological fracture: Secondary | ICD-10-CM | POA: Diagnosis not present

## 2024-03-11 DIAGNOSIS — E119 Type 2 diabetes mellitus without complications: Secondary | ICD-10-CM | POA: Diagnosis not present

## 2024-03-11 DIAGNOSIS — M1990 Unspecified osteoarthritis, unspecified site: Secondary | ICD-10-CM | POA: Diagnosis not present

## 2024-03-11 DIAGNOSIS — I10 Essential (primary) hypertension: Secondary | ICD-10-CM | POA: Diagnosis not present

## 2024-03-11 DIAGNOSIS — M5030 Other cervical disc degeneration, unspecified cervical region: Secondary | ICD-10-CM | POA: Diagnosis not present

## 2024-03-11 DIAGNOSIS — G8929 Other chronic pain: Secondary | ICD-10-CM | POA: Diagnosis not present

## 2024-03-11 DIAGNOSIS — F0390 Unspecified dementia without behavioral disturbance: Secondary | ICD-10-CM | POA: Diagnosis not present

## 2024-03-13 ENCOUNTER — Other Ambulatory Visit (HOSPITAL_BASED_OUTPATIENT_CLINIC_OR_DEPARTMENT_OTHER): Payer: Self-pay

## 2024-03-13 DIAGNOSIS — D631 Anemia in chronic kidney disease: Secondary | ICD-10-CM | POA: Diagnosis not present

## 2024-03-13 DIAGNOSIS — M1990 Unspecified osteoarthritis, unspecified site: Secondary | ICD-10-CM | POA: Diagnosis not present

## 2024-03-13 DIAGNOSIS — E1169 Type 2 diabetes mellitus with other specified complication: Secondary | ICD-10-CM | POA: Diagnosis not present

## 2024-03-13 DIAGNOSIS — I10 Essential (primary) hypertension: Secondary | ICD-10-CM | POA: Diagnosis not present

## 2024-03-13 DIAGNOSIS — R413 Other amnesia: Secondary | ICD-10-CM | POA: Diagnosis not present

## 2024-03-13 DIAGNOSIS — H3530 Unspecified macular degeneration: Secondary | ICD-10-CM | POA: Diagnosis not present

## 2024-03-13 DIAGNOSIS — N3281 Overactive bladder: Secondary | ICD-10-CM | POA: Diagnosis not present

## 2024-03-13 DIAGNOSIS — G8929 Other chronic pain: Secondary | ICD-10-CM | POA: Diagnosis not present

## 2024-03-13 DIAGNOSIS — E785 Hyperlipidemia, unspecified: Secondary | ICD-10-CM | POA: Diagnosis not present

## 2024-03-13 DIAGNOSIS — E1122 Type 2 diabetes mellitus with diabetic chronic kidney disease: Secondary | ICD-10-CM | POA: Diagnosis not present

## 2024-03-13 DIAGNOSIS — M5030 Other cervical disc degeneration, unspecified cervical region: Secondary | ICD-10-CM | POA: Diagnosis not present

## 2024-03-13 DIAGNOSIS — Z7982 Long term (current) use of aspirin: Secondary | ICD-10-CM | POA: Diagnosis not present

## 2024-03-13 DIAGNOSIS — I129 Hypertensive chronic kidney disease with stage 1 through stage 4 chronic kidney disease, or unspecified chronic kidney disease: Secondary | ICD-10-CM | POA: Diagnosis not present

## 2024-03-13 DIAGNOSIS — K5909 Other constipation: Secondary | ICD-10-CM | POA: Diagnosis not present

## 2024-03-13 DIAGNOSIS — D696 Thrombocytopenia, unspecified: Secondary | ICD-10-CM | POA: Diagnosis not present

## 2024-03-13 DIAGNOSIS — E119 Type 2 diabetes mellitus without complications: Secondary | ICD-10-CM | POA: Diagnosis not present

## 2024-03-13 DIAGNOSIS — N1832 Chronic kidney disease, stage 3b: Secondary | ICD-10-CM | POA: Diagnosis not present

## 2024-03-13 DIAGNOSIS — F0390 Unspecified dementia without behavioral disturbance: Secondary | ICD-10-CM | POA: Diagnosis not present

## 2024-03-19 DIAGNOSIS — G8929 Other chronic pain: Secondary | ICD-10-CM | POA: Diagnosis not present

## 2024-03-19 DIAGNOSIS — E119 Type 2 diabetes mellitus without complications: Secondary | ICD-10-CM | POA: Diagnosis not present

## 2024-03-19 DIAGNOSIS — I10 Essential (primary) hypertension: Secondary | ICD-10-CM | POA: Diagnosis not present

## 2024-03-19 DIAGNOSIS — F0390 Unspecified dementia without behavioral disturbance: Secondary | ICD-10-CM | POA: Diagnosis not present

## 2024-03-19 DIAGNOSIS — M1990 Unspecified osteoarthritis, unspecified site: Secondary | ICD-10-CM | POA: Diagnosis not present

## 2024-03-19 DIAGNOSIS — M5030 Other cervical disc degeneration, unspecified cervical region: Secondary | ICD-10-CM | POA: Diagnosis not present

## 2024-03-22 DIAGNOSIS — E119 Type 2 diabetes mellitus without complications: Secondary | ICD-10-CM | POA: Diagnosis not present

## 2024-03-22 DIAGNOSIS — F0390 Unspecified dementia without behavioral disturbance: Secondary | ICD-10-CM | POA: Diagnosis not present

## 2024-03-22 DIAGNOSIS — M1990 Unspecified osteoarthritis, unspecified site: Secondary | ICD-10-CM | POA: Diagnosis not present

## 2024-03-22 DIAGNOSIS — I10 Essential (primary) hypertension: Secondary | ICD-10-CM | POA: Diagnosis not present

## 2024-03-22 DIAGNOSIS — M5030 Other cervical disc degeneration, unspecified cervical region: Secondary | ICD-10-CM | POA: Diagnosis not present

## 2024-03-22 DIAGNOSIS — G8929 Other chronic pain: Secondary | ICD-10-CM | POA: Diagnosis not present

## 2024-03-26 DIAGNOSIS — F0390 Unspecified dementia without behavioral disturbance: Secondary | ICD-10-CM | POA: Diagnosis not present

## 2024-03-26 DIAGNOSIS — I10 Essential (primary) hypertension: Secondary | ICD-10-CM | POA: Diagnosis not present

## 2024-03-26 DIAGNOSIS — M5030 Other cervical disc degeneration, unspecified cervical region: Secondary | ICD-10-CM | POA: Diagnosis not present

## 2024-03-26 DIAGNOSIS — G8929 Other chronic pain: Secondary | ICD-10-CM | POA: Diagnosis not present

## 2024-03-26 DIAGNOSIS — E119 Type 2 diabetes mellitus without complications: Secondary | ICD-10-CM | POA: Diagnosis not present

## 2024-03-26 DIAGNOSIS — M1990 Unspecified osteoarthritis, unspecified site: Secondary | ICD-10-CM | POA: Diagnosis not present

## 2024-04-02 DIAGNOSIS — I10 Essential (primary) hypertension: Secondary | ICD-10-CM | POA: Diagnosis not present

## 2024-04-02 DIAGNOSIS — Z556 Problems related to health literacy: Secondary | ICD-10-CM | POA: Diagnosis not present

## 2024-04-02 DIAGNOSIS — J309 Allergic rhinitis, unspecified: Secondary | ICD-10-CM | POA: Diagnosis not present

## 2024-04-02 DIAGNOSIS — H3530 Unspecified macular degeneration: Secondary | ICD-10-CM | POA: Diagnosis not present

## 2024-04-02 DIAGNOSIS — G8929 Other chronic pain: Secondary | ICD-10-CM | POA: Diagnosis not present

## 2024-04-02 DIAGNOSIS — M1990 Unspecified osteoarthritis, unspecified site: Secondary | ICD-10-CM | POA: Diagnosis not present

## 2024-04-02 DIAGNOSIS — M5030 Other cervical disc degeneration, unspecified cervical region: Secondary | ICD-10-CM | POA: Diagnosis not present

## 2024-04-02 DIAGNOSIS — E785 Hyperlipidemia, unspecified: Secondary | ICD-10-CM | POA: Diagnosis not present

## 2024-04-02 DIAGNOSIS — E119 Type 2 diabetes mellitus without complications: Secondary | ICD-10-CM | POA: Diagnosis not present

## 2024-04-02 DIAGNOSIS — M810 Age-related osteoporosis without current pathological fracture: Secondary | ICD-10-CM | POA: Diagnosis not present

## 2024-04-02 DIAGNOSIS — Z860100 Personal history of colon polyps, unspecified: Secondary | ICD-10-CM | POA: Diagnosis not present

## 2024-04-02 DIAGNOSIS — M545 Low back pain, unspecified: Secondary | ICD-10-CM | POA: Diagnosis not present

## 2024-04-02 DIAGNOSIS — D649 Anemia, unspecified: Secondary | ICD-10-CM | POA: Diagnosis not present

## 2024-04-02 DIAGNOSIS — N3281 Overactive bladder: Secondary | ICD-10-CM | POA: Diagnosis not present

## 2024-04-02 DIAGNOSIS — Z79899 Other long term (current) drug therapy: Secondary | ICD-10-CM | POA: Diagnosis not present

## 2024-04-02 DIAGNOSIS — G4733 Obstructive sleep apnea (adult) (pediatric): Secondary | ICD-10-CM | POA: Diagnosis not present

## 2024-04-02 DIAGNOSIS — F0390 Unspecified dementia without behavioral disturbance: Secondary | ICD-10-CM | POA: Diagnosis not present

## 2024-04-02 DIAGNOSIS — Z7982 Long term (current) use of aspirin: Secondary | ICD-10-CM | POA: Diagnosis not present

## 2024-04-05 DIAGNOSIS — F0390 Unspecified dementia without behavioral disturbance: Secondary | ICD-10-CM | POA: Diagnosis not present

## 2024-04-05 DIAGNOSIS — M1990 Unspecified osteoarthritis, unspecified site: Secondary | ICD-10-CM | POA: Diagnosis not present

## 2024-04-05 DIAGNOSIS — G8929 Other chronic pain: Secondary | ICD-10-CM | POA: Diagnosis not present

## 2024-04-05 DIAGNOSIS — E119 Type 2 diabetes mellitus without complications: Secondary | ICD-10-CM | POA: Diagnosis not present

## 2024-04-05 DIAGNOSIS — I10 Essential (primary) hypertension: Secondary | ICD-10-CM | POA: Diagnosis not present

## 2024-04-05 DIAGNOSIS — M5030 Other cervical disc degeneration, unspecified cervical region: Secondary | ICD-10-CM | POA: Diagnosis not present

## 2024-04-10 DIAGNOSIS — F0390 Unspecified dementia without behavioral disturbance: Secondary | ICD-10-CM | POA: Diagnosis not present

## 2024-04-10 DIAGNOSIS — M5030 Other cervical disc degeneration, unspecified cervical region: Secondary | ICD-10-CM | POA: Diagnosis not present

## 2024-04-10 DIAGNOSIS — G8929 Other chronic pain: Secondary | ICD-10-CM | POA: Diagnosis not present

## 2024-04-10 DIAGNOSIS — E119 Type 2 diabetes mellitus without complications: Secondary | ICD-10-CM | POA: Diagnosis not present

## 2024-04-10 DIAGNOSIS — M1990 Unspecified osteoarthritis, unspecified site: Secondary | ICD-10-CM | POA: Diagnosis not present

## 2024-04-10 DIAGNOSIS — I10 Essential (primary) hypertension: Secondary | ICD-10-CM | POA: Diagnosis not present

## 2024-04-12 DIAGNOSIS — F0390 Unspecified dementia without behavioral disturbance: Secondary | ICD-10-CM | POA: Diagnosis not present

## 2024-04-12 DIAGNOSIS — M1990 Unspecified osteoarthritis, unspecified site: Secondary | ICD-10-CM | POA: Diagnosis not present

## 2024-04-12 DIAGNOSIS — M5030 Other cervical disc degeneration, unspecified cervical region: Secondary | ICD-10-CM | POA: Diagnosis not present

## 2024-04-12 DIAGNOSIS — G8929 Other chronic pain: Secondary | ICD-10-CM | POA: Diagnosis not present

## 2024-04-12 DIAGNOSIS — E119 Type 2 diabetes mellitus without complications: Secondary | ICD-10-CM | POA: Diagnosis not present

## 2024-04-12 DIAGNOSIS — I10 Essential (primary) hypertension: Secondary | ICD-10-CM | POA: Diagnosis not present

## 2024-04-16 DIAGNOSIS — D631 Anemia in chronic kidney disease: Secondary | ICD-10-CM | POA: Diagnosis not present

## 2024-04-16 DIAGNOSIS — E1122 Type 2 diabetes mellitus with diabetic chronic kidney disease: Secondary | ICD-10-CM | POA: Diagnosis not present

## 2024-04-16 DIAGNOSIS — M8580 Other specified disorders of bone density and structure, unspecified site: Secondary | ICD-10-CM | POA: Diagnosis not present

## 2024-04-16 DIAGNOSIS — K219 Gastro-esophageal reflux disease without esophagitis: Secondary | ICD-10-CM | POA: Diagnosis not present

## 2024-04-16 DIAGNOSIS — E1169 Type 2 diabetes mellitus with other specified complication: Secondary | ICD-10-CM | POA: Diagnosis not present

## 2024-04-16 DIAGNOSIS — N184 Chronic kidney disease, stage 4 (severe): Secondary | ICD-10-CM | POA: Diagnosis not present

## 2024-04-16 DIAGNOSIS — D649 Anemia, unspecified: Secondary | ICD-10-CM | POA: Diagnosis not present

## 2024-04-16 DIAGNOSIS — I129 Hypertensive chronic kidney disease with stage 1 through stage 4 chronic kidney disease, or unspecified chronic kidney disease: Secondary | ICD-10-CM | POA: Diagnosis not present

## 2024-04-16 DIAGNOSIS — E785 Hyperlipidemia, unspecified: Secondary | ICD-10-CM | POA: Diagnosis not present

## 2024-04-16 DIAGNOSIS — E7849 Other hyperlipidemia: Secondary | ICD-10-CM | POA: Diagnosis not present

## 2024-04-17 DIAGNOSIS — E1122 Type 2 diabetes mellitus with diabetic chronic kidney disease: Secondary | ICD-10-CM | POA: Diagnosis not present

## 2024-04-17 DIAGNOSIS — H3530 Unspecified macular degeneration: Secondary | ICD-10-CM | POA: Diagnosis not present

## 2024-04-17 DIAGNOSIS — D649 Anemia, unspecified: Secondary | ICD-10-CM | POA: Diagnosis not present

## 2024-04-17 DIAGNOSIS — R413 Other amnesia: Secondary | ICD-10-CM | POA: Diagnosis not present

## 2024-04-17 DIAGNOSIS — N184 Chronic kidney disease, stage 4 (severe): Secondary | ICD-10-CM | POA: Diagnosis not present

## 2024-04-17 DIAGNOSIS — Z7409 Other reduced mobility: Secondary | ICD-10-CM | POA: Diagnosis not present

## 2024-04-17 DIAGNOSIS — I129 Hypertensive chronic kidney disease with stage 1 through stage 4 chronic kidney disease, or unspecified chronic kidney disease: Secondary | ICD-10-CM | POA: Diagnosis not present

## 2024-04-17 DIAGNOSIS — R82998 Other abnormal findings in urine: Secondary | ICD-10-CM | POA: Diagnosis not present

## 2024-04-17 DIAGNOSIS — M8580 Other specified disorders of bone density and structure, unspecified site: Secondary | ICD-10-CM | POA: Diagnosis not present

## 2024-04-17 DIAGNOSIS — D890 Polyclonal hypergammaglobulinemia: Secondary | ICD-10-CM | POA: Diagnosis not present

## 2024-04-17 DIAGNOSIS — J309 Allergic rhinitis, unspecified: Secondary | ICD-10-CM | POA: Diagnosis not present

## 2024-04-17 DIAGNOSIS — E785 Hyperlipidemia, unspecified: Secondary | ICD-10-CM | POA: Diagnosis not present

## 2024-04-17 DIAGNOSIS — Z0000 Encounter for general adult medical examination without abnormal findings: Secondary | ICD-10-CM | POA: Diagnosis not present

## 2024-04-19 ENCOUNTER — Other Ambulatory Visit: Payer: Self-pay | Admitting: Internal Medicine

## 2024-04-19 DIAGNOSIS — Z79899 Other long term (current) drug therapy: Secondary | ICD-10-CM | POA: Diagnosis not present

## 2024-04-19 DIAGNOSIS — D649 Anemia, unspecified: Secondary | ICD-10-CM | POA: Diagnosis not present

## 2024-04-19 DIAGNOSIS — Z981 Arthrodesis status: Secondary | ICD-10-CM | POA: Diagnosis not present

## 2024-04-19 DIAGNOSIS — E785 Hyperlipidemia, unspecified: Secondary | ICD-10-CM | POA: Diagnosis not present

## 2024-04-19 DIAGNOSIS — M5030 Other cervical disc degeneration, unspecified cervical region: Secondary | ICD-10-CM | POA: Diagnosis not present

## 2024-04-19 DIAGNOSIS — M1990 Unspecified osteoarthritis, unspecified site: Secondary | ICD-10-CM | POA: Diagnosis not present

## 2024-04-19 DIAGNOSIS — N3281 Overactive bladder: Secondary | ICD-10-CM | POA: Diagnosis not present

## 2024-04-19 DIAGNOSIS — J309 Allergic rhinitis, unspecified: Secondary | ICD-10-CM | POA: Diagnosis not present

## 2024-04-19 DIAGNOSIS — M8580 Other specified disorders of bone density and structure, unspecified site: Secondary | ICD-10-CM | POA: Diagnosis not present

## 2024-04-19 DIAGNOSIS — Z556 Problems related to health literacy: Secondary | ICD-10-CM | POA: Diagnosis not present

## 2024-04-19 DIAGNOSIS — Z860100 Personal history of colon polyps, unspecified: Secondary | ICD-10-CM | POA: Diagnosis not present

## 2024-04-19 DIAGNOSIS — H3530 Unspecified macular degeneration: Secondary | ICD-10-CM | POA: Diagnosis not present

## 2024-04-19 DIAGNOSIS — Z7984 Long term (current) use of oral hypoglycemic drugs: Secondary | ICD-10-CM | POA: Diagnosis not present

## 2024-04-19 DIAGNOSIS — R32 Unspecified urinary incontinence: Secondary | ICD-10-CM | POA: Diagnosis not present

## 2024-04-19 DIAGNOSIS — I10 Essential (primary) hypertension: Secondary | ICD-10-CM | POA: Diagnosis not present

## 2024-04-19 DIAGNOSIS — E119 Type 2 diabetes mellitus without complications: Secondary | ICD-10-CM | POA: Diagnosis not present

## 2024-04-19 DIAGNOSIS — Z7982 Long term (current) use of aspirin: Secondary | ICD-10-CM | POA: Diagnosis not present

## 2024-04-22 ENCOUNTER — Telehealth: Payer: Self-pay

## 2024-04-22 NOTE — Telephone Encounter (Signed)
 Auth Submission: NO AUTH NEEDED Site of care: Site of care: [facility] Payer: medicare a/b, aetna supp Medication & CPT/J Code(s) submitted: Prolia  (Denosumab ) [id number] Diagnosis Code:  Route of submission (phone, fax, portal): portal Phone # Fax # Auth type: Buy/Bill HB Units/visits requested: 60mg , q70months x 2doses Reference number:  Approval from: 04/22/24 to 10/30/24

## 2024-04-23 DIAGNOSIS — D649 Anemia, unspecified: Secondary | ICD-10-CM | POA: Diagnosis not present

## 2024-04-23 DIAGNOSIS — M1990 Unspecified osteoarthritis, unspecified site: Secondary | ICD-10-CM | POA: Diagnosis not present

## 2024-04-23 DIAGNOSIS — E119 Type 2 diabetes mellitus without complications: Secondary | ICD-10-CM | POA: Diagnosis not present

## 2024-04-23 DIAGNOSIS — M5030 Other cervical disc degeneration, unspecified cervical region: Secondary | ICD-10-CM | POA: Diagnosis not present

## 2024-04-23 DIAGNOSIS — M8580 Other specified disorders of bone density and structure, unspecified site: Secondary | ICD-10-CM | POA: Diagnosis not present

## 2024-04-23 DIAGNOSIS — I10 Essential (primary) hypertension: Secondary | ICD-10-CM | POA: Diagnosis not present

## 2024-04-24 DIAGNOSIS — I739 Peripheral vascular disease, unspecified: Secondary | ICD-10-CM | POA: Diagnosis not present

## 2024-04-24 DIAGNOSIS — E1151 Type 2 diabetes mellitus with diabetic peripheral angiopathy without gangrene: Secondary | ICD-10-CM | POA: Diagnosis not present

## 2024-04-24 DIAGNOSIS — B351 Tinea unguium: Secondary | ICD-10-CM | POA: Diagnosis not present

## 2024-04-24 DIAGNOSIS — M792 Neuralgia and neuritis, unspecified: Secondary | ICD-10-CM | POA: Diagnosis not present

## 2024-04-24 DIAGNOSIS — L84 Corns and callosities: Secondary | ICD-10-CM | POA: Diagnosis not present

## 2024-04-24 DIAGNOSIS — M205X1 Other deformities of toe(s) (acquired), right foot: Secondary | ICD-10-CM | POA: Diagnosis not present

## 2024-04-24 DIAGNOSIS — B353 Tinea pedis: Secondary | ICD-10-CM | POA: Diagnosis not present

## 2024-04-24 DIAGNOSIS — L603 Nail dystrophy: Secondary | ICD-10-CM | POA: Diagnosis not present

## 2024-04-25 ENCOUNTER — Other Ambulatory Visit (HOSPITAL_BASED_OUTPATIENT_CLINIC_OR_DEPARTMENT_OTHER): Payer: Self-pay

## 2024-04-25 DIAGNOSIS — M8580 Other specified disorders of bone density and structure, unspecified site: Secondary | ICD-10-CM | POA: Diagnosis not present

## 2024-04-25 DIAGNOSIS — I10 Essential (primary) hypertension: Secondary | ICD-10-CM | POA: Diagnosis not present

## 2024-04-25 DIAGNOSIS — M5030 Other cervical disc degeneration, unspecified cervical region: Secondary | ICD-10-CM | POA: Diagnosis not present

## 2024-04-25 DIAGNOSIS — M1990 Unspecified osteoarthritis, unspecified site: Secondary | ICD-10-CM | POA: Diagnosis not present

## 2024-04-25 DIAGNOSIS — E119 Type 2 diabetes mellitus without complications: Secondary | ICD-10-CM | POA: Diagnosis not present

## 2024-04-25 DIAGNOSIS — D649 Anemia, unspecified: Secondary | ICD-10-CM | POA: Diagnosis not present

## 2024-04-26 ENCOUNTER — Other Ambulatory Visit (HOSPITAL_BASED_OUTPATIENT_CLINIC_OR_DEPARTMENT_OTHER): Payer: Self-pay

## 2024-04-26 MED ORDER — CEFDINIR 300 MG PO CAPS
300.0000 mg | ORAL_CAPSULE | Freq: Two times a day (BID) | ORAL | 0 refills | Status: DC
  Filled 2024-04-26: qty 14, 7d supply, fill #0

## 2024-04-29 DIAGNOSIS — E113392 Type 2 diabetes mellitus with moderate nonproliferative diabetic retinopathy without macular edema, left eye: Secondary | ICD-10-CM | POA: Diagnosis not present

## 2024-04-29 DIAGNOSIS — E113551 Type 2 diabetes mellitus with stable proliferative diabetic retinopathy, right eye: Secondary | ICD-10-CM | POA: Diagnosis not present

## 2024-04-29 DIAGNOSIS — H35371 Puckering of macula, right eye: Secondary | ICD-10-CM | POA: Diagnosis not present

## 2024-04-29 DIAGNOSIS — H43822 Vitreomacular adhesion, left eye: Secondary | ICD-10-CM | POA: Diagnosis not present

## 2024-04-29 DIAGNOSIS — H4321 Crystalline deposits in vitreous body, right eye: Secondary | ICD-10-CM | POA: Diagnosis not present

## 2024-04-30 ENCOUNTER — Inpatient Hospital Stay: Attending: Hematology | Admitting: Hematology

## 2024-04-30 ENCOUNTER — Encounter: Payer: Self-pay | Admitting: Internal Medicine

## 2024-04-30 ENCOUNTER — Encounter: Payer: Self-pay | Admitting: Hematology

## 2024-04-30 ENCOUNTER — Inpatient Hospital Stay

## 2024-04-30 VITALS — BP 142/60 | HR 62 | Temp 97.4°F | Resp 15 | Ht 68.0 in | Wt 228.9 lb

## 2024-04-30 DIAGNOSIS — Z7982 Long term (current) use of aspirin: Secondary | ICD-10-CM | POA: Insufficient documentation

## 2024-04-30 DIAGNOSIS — D649 Anemia, unspecified: Secondary | ICD-10-CM | POA: Insufficient documentation

## 2024-04-30 DIAGNOSIS — M5030 Other cervical disc degeneration, unspecified cervical region: Secondary | ICD-10-CM | POA: Diagnosis not present

## 2024-04-30 DIAGNOSIS — N184 Chronic kidney disease, stage 4 (severe): Secondary | ICD-10-CM | POA: Diagnosis not present

## 2024-04-30 DIAGNOSIS — I10 Essential (primary) hypertension: Secondary | ICD-10-CM | POA: Diagnosis not present

## 2024-04-30 DIAGNOSIS — G8929 Other chronic pain: Secondary | ICD-10-CM | POA: Diagnosis not present

## 2024-04-30 DIAGNOSIS — F03A0 Unspecified dementia, mild, without behavioral disturbance, psychotic disturbance, mood disturbance, and anxiety: Secondary | ICD-10-CM | POA: Insufficient documentation

## 2024-04-30 DIAGNOSIS — M549 Dorsalgia, unspecified: Secondary | ICD-10-CM | POA: Diagnosis not present

## 2024-04-30 DIAGNOSIS — I739 Peripheral vascular disease, unspecified: Secondary | ICD-10-CM | POA: Diagnosis not present

## 2024-04-30 DIAGNOSIS — M810 Age-related osteoporosis without current pathological fracture: Secondary | ICD-10-CM | POA: Insufficient documentation

## 2024-04-30 DIAGNOSIS — E119 Type 2 diabetes mellitus without complications: Secondary | ICD-10-CM | POA: Diagnosis not present

## 2024-04-30 DIAGNOSIS — I129 Hypertensive chronic kidney disease with stage 1 through stage 4 chronic kidney disease, or unspecified chronic kidney disease: Secondary | ICD-10-CM | POA: Insufficient documentation

## 2024-04-30 DIAGNOSIS — M8580 Other specified disorders of bone density and structure, unspecified site: Secondary | ICD-10-CM | POA: Diagnosis not present

## 2024-04-30 DIAGNOSIS — M1990 Unspecified osteoarthritis, unspecified site: Secondary | ICD-10-CM | POA: Diagnosis not present

## 2024-04-30 DIAGNOSIS — E1122 Type 2 diabetes mellitus with diabetic chronic kidney disease: Secondary | ICD-10-CM | POA: Diagnosis not present

## 2024-04-30 DIAGNOSIS — Z79899 Other long term (current) drug therapy: Secondary | ICD-10-CM | POA: Diagnosis not present

## 2024-04-30 LAB — CBC WITH DIFFERENTIAL/PLATELET
Abs Immature Granulocytes: 0.01 10*3/uL (ref 0.00–0.07)
Basophils Absolute: 0 10*3/uL (ref 0.0–0.1)
Basophils Relative: 1 %
Eosinophils Absolute: 0.2 10*3/uL (ref 0.0–0.5)
Eosinophils Relative: 3 %
HCT: 28.7 % — ABNORMAL LOW (ref 36.0–46.0)
Hemoglobin: 9.4 g/dL — ABNORMAL LOW (ref 12.0–15.0)
Immature Granulocytes: 0 %
Lymphocytes Relative: 29 %
Lymphs Abs: 1.6 10*3/uL (ref 0.7–4.0)
MCH: 29.3 pg (ref 26.0–34.0)
MCHC: 32.8 g/dL (ref 30.0–36.0)
MCV: 89.4 fL (ref 80.0–100.0)
Monocytes Absolute: 0.5 10*3/uL (ref 0.1–1.0)
Monocytes Relative: 9 %
Neutro Abs: 3.2 10*3/uL (ref 1.7–7.7)
Neutrophils Relative %: 58 %
Platelets: 178 10*3/uL (ref 150–400)
RBC: 3.21 MIL/uL — ABNORMAL LOW (ref 3.87–5.11)
RDW: 15.6 % — ABNORMAL HIGH (ref 11.5–15.5)
WBC: 5.4 10*3/uL (ref 4.0–10.5)
nRBC: 0 % (ref 0.0–0.2)

## 2024-04-30 LAB — RETIC PANEL
Immature Retic Fract: 15 % (ref 2.3–15.9)
RBC.: 3.21 MIL/uL — ABNORMAL LOW (ref 3.87–5.11)
Retic Count, Absolute: 38.2 10*3/uL (ref 19.0–186.0)
Retic Ct Pct: 1.2 % (ref 0.4–3.1)
Reticulocyte Hemoglobin: 31.9 pg (ref >27.9)

## 2024-04-30 LAB — VITAMIN B12: Vitamin B-12: 763 pg/mL (ref 180–914)

## 2024-04-30 NOTE — Progress Notes (Signed)
 [facility]   Telephone:(336) [phone] Fax:([phone]) [phone]   Clinic New Consult Note   Patient Care Team: Shayne Anes, MD as PCP - General (Internal Medicine) 04/30/2024  CHIEF COMPLAINTS/PURPOSE OF CONSULTATION:  Anemia   REFERRING PHYSICIAN: Shayne Anes, MD   Discussed the use of AI scribe software for clinical note transcription with the patient, who gave verbal consent to proceed.  History of Present Illness [name] Mathews is an 89 year old female with chronic anemia who presents for a new consult regarding her anemia. She is accompanied by her daughter, Angeline. She was referred by her primary care physician for evaluation of her anemia.  Avilyn has chronic anemia with fluctuating hemoglobin levels, with her recent Hb at 9.8, on 04/16/2024, down from 13 last year (per daughter, lab report not available to me). Her hemoglobin has dropped as low as 7 in the past, particularly after back surgeries in 2013 and 2018. She experiences low energy and fatigue, which are attributed to her anemia.  She has diabetes with an A1c of 6.3, indicating good control. She was recently taken off Actos  due to swelling. Her hypertension is managed with amlodipine  and another medication. Her medical history includes chronic kidney disease, peripheral vascular disease, chronic back pain, osteoporosis, and a benign breast lump removed in her youth.  Her current medications include antibiotics for a UTI, blood pressure medications, bladder medication, Linzess , galantamine , Singulair , and Crestor . She takes iron pills twice a week and supplements including vitamin B12, vitamin C, and a multivitamin.  Her family history includes a sister with breast cancer. She does not consume alcohol  or smoke. She is widowed and lives with her daughter and son-in-law.     MEDICAL HISTORY:  Past Medical History:  Diagnosis Date   Anemia    hx of   Arthritis    Breast lump    hx of   Cataract    left eye    Chronic back pain    Diabetes mellitus    Hyperlipidemia    Hypertension    sees Dr. 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   Vitrectomy, PRP to release VMT, 11-11-2020    SURGICAL HISTORY: Past Surgical History:  Procedure Laterality Date   APPENDECTOMY     BACK SURGERY     BREAST CYST EXCISION Left    BREAST SURGERY     cyst removed left side   BUNIONECTOMY     CARDIOVASCULAR STRESS TEST     COLONOSCOPY     DILATION AND CURETTAGE OF UTERUS     TONSILLECTOMY      SOCIAL HISTORY: Social History   Socioeconomic History   Marital status: Widowed    Spouse name: Not on file   Number of children: 4   Years of education: Not on file   Highest education level: Not on file  Occupational History   Not on file  Tobacco Use   Smoking status: Never   Smokeless tobacco: Never  Vaping Use   Vaping status: Never Used  Substance and Sexual Activity   Alcohol  use: No   Drug use: No   Sexual activity: Not on file  Other Topics Concern   Not on file  Social History Narrative   Not on file   Social Drivers of Health  Financial Resource Strain: Not on file  Food Insecurity: No Food Insecurity (04/30/2024)   Hunger Vital Sign    Worried About Running Out of Food in the Last Year: Never true    Ran Out of Food in the Last Year: Never true  Transportation Needs: No Transportation Needs (04/30/2024)   PRAPARE - Administrator, Civil Service (Medical): No    Lack of Transportation (Non-Medical): No  Physical Activity: Not on file  Stress: Not on file  Social Connections: Not on file  Intimate Partner Violence: Not At Risk (04/30/2024)   Humiliation, Afraid, Rape, and Kick questionnaire    Fear of Current or Ex-Partner: No    Emotionally Abused: No    Physically Abused: No     Sexually Abused: No    FAMILY HISTORY: Family History  Problem Relation Age of Onset   Breast cancer Sister 41    ALLERGIES:  is allergic to januvia [sitagliptin] and simvastatin.  MEDICATIONS:  Current Outpatient Medications  Medication Sig Dispense Refill   acetaminophen  (TYLENOL ) 325 MG tablet Take 2 tablets (650 mg total) by mouth every 4 (four) hours as needed for mild pain ((score 1 to 3) or temp > 100.5).     amLODipine  (NORVASC ) 5 MG tablet Take 1 tablet (5 mg total) by mouth every evening. 90 tablet 3   aspirin  81 MG tablet Take 81 mg by mouth daily.     Bacillus Coagulans-Inulin (ALIGN PREBIOTIC-PROBIOTIC PO) Take by mouth.     cefdinir (OMNICEF) 300 MG capsule Take 1 capsule (300 mg total) by mouth 2 (two) times daily. 14 capsule 0   Cholecalciferol (VITAMIN D3) 125 MCG (5000 UT) TABS Take 1 tablet (5,000 Units total) by mouth daily. 30 tablet 0   COVID-19 mRNA Vac-TriS, Pfizer, (COMIRNATY ) SUSP injection Inject into the muscle. 0.3 mL 0   cyclobenzaprine  (FLEXERIL ) 10 MG tablet Take 1 tablet (10 mg total) by mouth 3 (three) times daily as needed for muscle spasms. 60 tablet 0   docusate sodium  (COLACE) 250 MG capsule Take 250 mg by mouth daily.     Ferrous Sulfate  (SLOW RELEASE IRON PO) Take 1 tablet by mouth daily.     fluconazole  (DIFLUCAN ) 200 MG tablet Take 1 tablet by mouth as needed for yeast infection, no more than 1 dose in 3 days. 5 tablet 1   fluticasone  (FLONASE ) 50 MCG/ACT nasal spray Place 1 spray into both nostrils daily as needed for allergies or rhinitis.     fluticasone  (FLONASE ) 50 MCG/ACT nasal spray Spray 2 sprays into each nostril every day once daily. 16 g 3   fluticasone  (FLONASE ) 50 MCG/ACT nasal spray Place 2 sprays into both nostrils daily. 48 g 3   galantamine  (RAZADYNE  ER) 8 MG 24 hr capsule Take 1 capsule (8 mg total) by mouth every morning with food. 90 capsule 3   galantamine  (RAZADYNE  ER) 8 MG 24 hr capsule Take 1 capsule (8 mg total) by  mouth every morning with food. 90 capsule 3   hydrALAZINE  (APRESOLINE ) 25 MG tablet Take 1 tablet (25 mg total) by mouth 2 (two) times daily. 180 tablet 3   hydrocortisone  (ANUCORT-HC ) 25 MG suppository Place 1 suppository (25 mg total) rectally 3 (three) times daily. 30 suppository 0   Lactobacillus-Inulin (PROBIOTIC DIGESTIVE SUPPORT PO) Take 2 each by mouth every morning.     Lancets (ONETOUCH DELICA PLUS LANCET33G) MISC Apply 1 each topically daily.     Lidocaine -Hydrocort , Perianal, 3-0.5 % CREA Apply to  rectum 2 (two) times daily as needed externally. 28.35 g 1   linaclotide  (LINZESS ) 145 MCG CAPS capsule Take 1 capsule (145 mcg total) by mouth daily without food. 90 capsule 4   mirabegron  ER (MYRBETRIQ ) 50 MG TB24 tablet Take 1 tablet (50 mg total) by mouth daily. 90 tablet 3   montelukast  (SINGULAIR ) 10 MG tablet Take 1 tablet (10 mg total) by mouth daily. 90 tablet 4   montelukast  (SINGULAIR ) 10 MG tablet Take 1 tablet (10 mg total) by mouth daily. 90 tablet 4   Multiple Vitamins-Minerals (MULTIVITAMIN PO) Take 2 each by mouth daily.      nebivolol  (BYSTOLIC ) 5 MG tablet Take 1 tablet (5 mg total) by mouth at bedtime. 30 tablet 11   ONETOUCH VERIO test strip USE 1 TEST STRIP TO CHECK BLOOD GLUCOSE ONCE A DAY DX  E11.9     pioglitazone  (ACTOS ) 30 MG tablet Take 1 tablet (30 mg total) by mouth daily. 30 tablet 0   rosuvastatin  (CRESTOR ) 5 MG tablet Take 1 tablet (5 mg total) by mouth daily. 30 tablet 0   rosuvastatin  (CRESTOR ) 5 MG tablet Take 1 tablet (5 mg total) by mouth daily. 90 tablet 4   rosuvastatin  (CRESTOR ) 5 MG tablet Take 1 tablet (5 mg total) by mouth daily. 90 tablet 3   RSV vaccine recomb adjuvanted (AREXVY ) 120 MCG/0.5ML injection Inject into the muscle. 0.5 mL 0   vitamin B-12 (CYANOCOBALAMIN) 1000 MCG tablet Take 1,000 mcg by mouth daily. OTC     vitamin C (ASCORBIC ACID) 500 MG tablet Take 500 mg by mouth daily.     No current facility-administered medications for this  visit.    REVIEW OF SYSTEMS:   Constitutional: Denies fevers, chills or abnormal night sweats Eyes: Denies blurriness of vision, double vision or watery eyes Ears, nose, mouth, throat, and face: Denies mucositis or sore throat Respiratory: Denies cough, dyspnea or wheezes Cardiovascular: Denies palpitation, chest discomfort or lower extremity swelling Gastrointestinal:  Denies nausea, heartburn or change in bowel habits Skin: Denies abnormal skin rashes Lymphatics: Denies new lymphadenopathy or easy bruising Neurological:Denies numbness, tingling or new weaknesses Behavioral/Psych: Mood is stable, no new changes  All other systems were reviewed with the patient and are negative.  PHYSICAL EXAMINATION: ECOG PERFORMANCE STATUS: 2 - Symptomatic, <50% confined to bed  Vitals:   04/30/24 1437  BP: (!) 142/60  Pulse: 62  Resp: 15  Temp: (!) 97.4 F (36.3 C)  SpO2: 99%   Filed Weights   04/30/24 1437  Weight: 228 lb 14.4 oz (103.8 kg)    GENERAL:alert, no distress and comfortable SKIN: skin color, texture, turgor are normal, no rashes or significant lesions EYES: normal, conjunctiva are pink and non-injected, sclera clear OROPHARYNX:no exudate, no erythema and lips, buccal mucosa, and tongue normal  NECK: supple, thyroid  normal size, non-tender, without nodularity LYMPH:  no palpable lymphadenopathy in the cervical, axillary or inguinal LUNGS: clear to auscultation and percussion with normal breathing effort HEART: regular rate & rhythm and no murmurs and no lower extremity edema ABDOMEN:abdomen soft, non-tender and normal bowel sounds Musculoskeletal:no cyanosis of digits and no clubbing  PSYCH: alert & oriented x 3 with fluent speech NEURO: no focal motor/sensory deficits  Physical Exam    LABORATORY DATA:  I have reviewed the data as listed    Latest Ref Rng & Units 04/30/2024    3:32 PM 12/03/2020    1:38 PM 06/25/2019    6:29 AM  CBC  WBC 4.0 -  10.5 K/uL 5.4  4.6   5.6   Hemoglobin 12.0 - 15.0 g/dL 9.4  89.1  8.2   Hematocrit 36.0 - 46.0 % 28.7  33.3  25.0   Platelets 150 - 400 K/uL 178  168  262     @cmpl @  RADIOGRAPHIC STUDIES: I have personally reviewed the radiological images as listed and agreed with the findings in the report. No results found.   Assessment & Plan Normocytic anemia, likely anemia of chronic disease Chronic anemia likely secondary to stage 4 chronic kidney disease, diabetes, and hypertension. Recent Hemoglobin level is 9.8, which may contribute to fatigue. Iron levels are normal, but total iron binding capacity is low, consistent with anemia of chronic disease. Differential diagnosis includes multiple myeloma and myelodysplastic syndrome, but these are less likely. - Order CBC and other blood tests to rule out multiple myeloma and hemolytic anemia - Monitor hemoglobin levels every three months. - Consider Aranesp injection if hemoglobin drops below 9, with risks including cancer growth stimulation, stroke, and heart attack.  Chronic kidney disease, stage 4 Long-standing chronic kidney disease, currently stage 4, likely secondary to diabetes and hypertension. Kidney function requires monitoring. Erythropoietin deficiency contributes to anemia. Importance of hydration and avoiding nephrotoxic medications emphasized. - Monitor kidney function regularly. - Encourage hydration and avoid nephrotoxic medications like NSAIDs.  Type 2 diabetes mellitus without complications Long-standing diabetes with good control, as evidenced by an A1c of 6.3. Actos  was recently discontinued due to swelling, and alternative medication is needed. - Follow up with primary care physician to adjust diabetes medication regimen.  Hypertension Hypertension is well-controlled with current medication regimen, including amlodipine  and hydralazine . - Continue current antihypertensive medications.  Peripheral vascular disease Peripheral vascular disease is  present but not currently causing significant symptoms. No history of stenting or surgical intervention. - Monitor for any changes in symptoms.  Osteoporosis Osteoporosis managed with Prolia  injections twice a year. Physical therapy is ongoing to maintain mobility and prevent falls. - Continue Prolia  injections every six months. - Continue physical therapy to maintain mobility.  Chronic back pain Chronic back pain likely related to previous back surgeries and arthritis. Pain is managed with physical therapy and activity modification. - Continue physical therapy and encourage regular activity to manage pain.  Mild dementia Mild dementia managed with galantamine . No significant changes in cognitive function reported. - Continue galantamine  as prescribed.  Plan - Labs today, including CBC, reticulocyte panel, iron study, B12, folate, SPEP with immunofixation, serum light chain levels and erythropoietin level - Will monitor CBC and ferritin every 3 months at [facility] [employer] office, which is more convenient for her. - Follow-up in 8 to 9 months.   Orders Placed This Encounter  Procedures   CBC with Differential/Platelet    Standing Status:   Standing    Number of Occurrences:   50    Expiration Date:   04/30/2025   Ferritin    Standing Status:   Standing    Number of Occurrences:   20    Expiration Date:   04/30/2025   Folate RBC    Standing Status:   Future    Number of Occurrences:   1    Expiration Date:   04/30/2025   Vitamin B12    Standing Status:   Future    Number of Occurrences:   1    Expected Date:   04/30/2024    Expiration Date:   04/30/2025   Retic Panel    Standing Status:  Future    Number of Occurrences:   1    Expiration Date:   04/30/2025   SPEP with reflex to IFE    Standing Status:   Future    Number of Occurrences:   1    Expiration Date:   04/30/2025   Kappa/lambda light chains    Standing Status:   Future    Number of Occurrences:   1    Expiration Date:    04/30/2025   Erythropoietin    Standing Status:   Future    Number of Occurrences:   1    Expiration Date:   04/30/2025    All questions were answered. The patient knows to call the clinic with any problems, questions or concerns. I spent 30 minutes counseling the patient face to face. The total time spent in the appointment was 40 minutes including review of chart and various tests results, discussions about plan of care and coordination of care plan.     Onita Mattock, MD 04/30/2024 6:12 PM

## 2024-05-01 ENCOUNTER — Other Ambulatory Visit: Payer: Self-pay

## 2024-05-01 ENCOUNTER — Other Ambulatory Visit (HOSPITAL_BASED_OUTPATIENT_CLINIC_OR_DEPARTMENT_OTHER): Payer: Self-pay

## 2024-05-01 LAB — KAPPA/LAMBDA LIGHT CHAINS
Kappa free light chain: 35.1 mg/L — ABNORMAL HIGH (ref 3.3–19.4)
Kappa, lambda light chain ratio: 1.55 (ref 0.26–1.65)
Lambda free light chains: 22.6 mg/L (ref 5.7–26.3)

## 2024-05-01 LAB — FOLATE RBC
Folate, Hemolysate: >620 ng/mL
Folate, RBC: >2060 ng/mL (ref >498)
Hematocrit: 30.1 % — ABNORMAL LOW (ref 34.0–46.6)

## 2024-05-01 LAB — PROTEIN ELECTROPHORESIS, SERUM, WITH REFLEX
A/G Ratio: 1.1 (ref 0.7–1.7)
Albumin ELP: 3.5 g/dL (ref 2.9–4.4)
Alpha-1-Globulin: 0.2 g/dL (ref 0.0–0.4)
Alpha-2-Globulin: 1 g/dL (ref 0.4–1.0)
Beta Globulin: 1.1 g/dL (ref 0.7–1.3)
Gamma Globulin: 1.1 g/dL (ref 0.4–1.8)
Globulin, Total: 3.3 g/dL (ref 2.2–3.9)
Total Protein ELP: 6.8 g/dL (ref 6.0–8.5)

## 2024-05-01 LAB — FERRITIN: Ferritin: 81 ng/mL (ref 11–307)

## 2024-05-01 LAB — ERYTHROPOIETIN: Erythropoietin: 14.9 m[IU]/mL (ref 2.6–18.5)

## 2024-05-02 ENCOUNTER — Other Ambulatory Visit (HOSPITAL_BASED_OUTPATIENT_CLINIC_OR_DEPARTMENT_OTHER): Payer: Self-pay

## 2024-05-05 ENCOUNTER — Ambulatory Visit: Payer: Self-pay | Admitting: Hematology

## 2024-05-06 ENCOUNTER — Encounter: Payer: Self-pay | Admitting: Internal Medicine

## 2024-05-06 ENCOUNTER — Other Ambulatory Visit: Payer: Self-pay

## 2024-05-06 NOTE — Telephone Encounter (Addendum)
 Contacted patient's daughter, [name] Mathews via telephone call.  Went over provider's comments from below.  [name] Mathews verbalized understanding and did not have any further concerns or questions.   ----- Message from Onita Mattock sent at 05/05/2024  5:02 PM EDT ----- Please let pt or her daughter know that her anemia workup was unremarkable, with normal iron, B12 and folate levels, no hemolysis or evidence of multiple myeloma, her anemia is likely secondary to  her CKD, will f/u her CBC at [facility], please make sure she has lab every 3 months there. Thx   Onita Mattock  ----- Message ----- From: Rebecka, Lab In [place] Sent: 04/30/2024   4:01 PM EDT To: Onita Mattock, MD

## 2024-05-07 ENCOUNTER — Other Ambulatory Visit (HOSPITAL_BASED_OUTPATIENT_CLINIC_OR_DEPARTMENT_OTHER): Payer: Self-pay

## 2024-05-07 DIAGNOSIS — M545 Low back pain, unspecified: Secondary | ICD-10-CM | POA: Diagnosis not present

## 2024-05-07 DIAGNOSIS — N184 Chronic kidney disease, stage 4 (severe): Secondary | ICD-10-CM | POA: Diagnosis not present

## 2024-05-07 DIAGNOSIS — E1122 Type 2 diabetes mellitus with diabetic chronic kidney disease: Secondary | ICD-10-CM | POA: Diagnosis not present

## 2024-05-07 DIAGNOSIS — W010XXA Fall on same level from slipping, tripping and stumbling without subsequent striking against object, initial encounter: Secondary | ICD-10-CM | POA: Diagnosis not present

## 2024-05-07 DIAGNOSIS — R269 Unspecified abnormalities of gait and mobility: Secondary | ICD-10-CM | POA: Diagnosis not present

## 2024-05-07 DIAGNOSIS — R413 Other amnesia: Secondary | ICD-10-CM | POA: Diagnosis not present

## 2024-05-07 DIAGNOSIS — E1169 Type 2 diabetes mellitus with other specified complication: Secondary | ICD-10-CM | POA: Diagnosis not present

## 2024-05-07 DIAGNOSIS — I129 Hypertensive chronic kidney disease with stage 1 through stage 4 chronic kidney disease, or unspecified chronic kidney disease: Secondary | ICD-10-CM | POA: Diagnosis not present

## 2024-05-07 DIAGNOSIS — M25551 Pain in right hip: Secondary | ICD-10-CM | POA: Diagnosis not present

## 2024-05-07 MED ORDER — ONETOUCH VERIO VI STRP
ORAL_STRIP | 3 refills | Status: AC
  Filled 2024-05-07: qty 100, 90d supply, fill #0
  Filled 2024-08-24: qty 100, 90d supply, fill #1

## 2024-05-08 ENCOUNTER — Other Ambulatory Visit (HOSPITAL_BASED_OUTPATIENT_CLINIC_OR_DEPARTMENT_OTHER): Payer: Self-pay

## 2024-05-09 ENCOUNTER — Telehealth: Payer: Self-pay | Admitting: Hematology

## 2024-05-09 NOTE — Telephone Encounter (Signed)
 Scheduled appointments per 7/1 los. Called and left a VM with the appointment details for the patient.

## 2024-05-10 DIAGNOSIS — I10 Essential (primary) hypertension: Secondary | ICD-10-CM | POA: Diagnosis not present

## 2024-05-10 DIAGNOSIS — M8580 Other specified disorders of bone density and structure, unspecified site: Secondary | ICD-10-CM | POA: Diagnosis not present

## 2024-05-10 DIAGNOSIS — M1990 Unspecified osteoarthritis, unspecified site: Secondary | ICD-10-CM | POA: Diagnosis not present

## 2024-05-10 DIAGNOSIS — E119 Type 2 diabetes mellitus without complications: Secondary | ICD-10-CM | POA: Diagnosis not present

## 2024-05-10 DIAGNOSIS — M5030 Other cervical disc degeneration, unspecified cervical region: Secondary | ICD-10-CM | POA: Diagnosis not present

## 2024-05-10 DIAGNOSIS — D649 Anemia, unspecified: Secondary | ICD-10-CM | POA: Diagnosis not present

## 2024-05-14 DIAGNOSIS — D649 Anemia, unspecified: Secondary | ICD-10-CM | POA: Diagnosis not present

## 2024-05-14 DIAGNOSIS — I10 Essential (primary) hypertension: Secondary | ICD-10-CM | POA: Diagnosis not present

## 2024-05-14 DIAGNOSIS — M5030 Other cervical disc degeneration, unspecified cervical region: Secondary | ICD-10-CM | POA: Diagnosis not present

## 2024-05-14 DIAGNOSIS — M1990 Unspecified osteoarthritis, unspecified site: Secondary | ICD-10-CM | POA: Diagnosis not present

## 2024-05-14 DIAGNOSIS — M8580 Other specified disorders of bone density and structure, unspecified site: Secondary | ICD-10-CM | POA: Diagnosis not present

## 2024-05-14 DIAGNOSIS — E119 Type 2 diabetes mellitus without complications: Secondary | ICD-10-CM | POA: Diagnosis not present

## 2024-05-16 DIAGNOSIS — E1169 Type 2 diabetes mellitus with other specified complication: Secondary | ICD-10-CM | POA: Diagnosis not present

## 2024-05-16 DIAGNOSIS — E785 Hyperlipidemia, unspecified: Secondary | ICD-10-CM | POA: Diagnosis not present

## 2024-05-16 DIAGNOSIS — J309 Allergic rhinitis, unspecified: Secondary | ICD-10-CM | POA: Diagnosis not present

## 2024-05-16 DIAGNOSIS — M1990 Unspecified osteoarthritis, unspecified site: Secondary | ICD-10-CM | POA: Diagnosis not present

## 2024-05-16 DIAGNOSIS — N3281 Overactive bladder: Secondary | ICD-10-CM | POA: Diagnosis not present

## 2024-05-16 DIAGNOSIS — Z7982 Long term (current) use of aspirin: Secondary | ICD-10-CM | POA: Diagnosis not present

## 2024-05-16 DIAGNOSIS — M8530 Osteitis condensans, unspecified site: Secondary | ICD-10-CM | POA: Diagnosis not present

## 2024-05-16 DIAGNOSIS — I10 Essential (primary) hypertension: Secondary | ICD-10-CM | POA: Diagnosis not present

## 2024-05-16 DIAGNOSIS — D649 Anemia, unspecified: Secondary | ICD-10-CM | POA: Diagnosis not present

## 2024-05-16 DIAGNOSIS — M5030 Other cervical disc degeneration, unspecified cervical region: Secondary | ICD-10-CM | POA: Diagnosis not present

## 2024-05-16 DIAGNOSIS — H3530 Unspecified macular degeneration: Secondary | ICD-10-CM | POA: Diagnosis not present

## 2024-05-16 DIAGNOSIS — R32 Unspecified urinary incontinence: Secondary | ICD-10-CM | POA: Diagnosis not present

## 2024-05-27 DIAGNOSIS — A499 Bacterial infection, unspecified: Secondary | ICD-10-CM | POA: Diagnosis not present

## 2024-05-27 DIAGNOSIS — R300 Dysuria: Secondary | ICD-10-CM | POA: Diagnosis not present

## 2024-05-27 DIAGNOSIS — N390 Urinary tract infection, site not specified: Secondary | ICD-10-CM | POA: Diagnosis not present

## 2024-05-27 DIAGNOSIS — R531 Weakness: Secondary | ICD-10-CM | POA: Diagnosis not present

## 2024-05-28 DIAGNOSIS — R9389 Abnormal findings on diagnostic imaging of other specified body structures: Secondary | ICD-10-CM | POA: Diagnosis not present

## 2024-05-28 DIAGNOSIS — Z794 Long term (current) use of insulin: Secondary | ICD-10-CM | POA: Diagnosis not present

## 2024-05-28 DIAGNOSIS — I959 Hypotension, unspecified: Secondary | ICD-10-CM | POA: Diagnosis not present

## 2024-05-28 DIAGNOSIS — Z751 Person awaiting admission to adequate facility elsewhere: Secondary | ICD-10-CM | POA: Diagnosis not present

## 2024-05-28 DIAGNOSIS — R339 Retention of urine, unspecified: Secondary | ICD-10-CM | POA: Diagnosis not present

## 2024-05-28 DIAGNOSIS — M6284 Sarcopenia: Secondary | ICD-10-CM | POA: Diagnosis not present

## 2024-05-28 DIAGNOSIS — Z7982 Long term (current) use of aspirin: Secondary | ICD-10-CM | POA: Diagnosis not present

## 2024-05-28 DIAGNOSIS — I5A Non-ischemic myocardial injury (non-traumatic): Secondary | ICD-10-CM | POA: Diagnosis not present

## 2024-05-28 DIAGNOSIS — E785 Hyperlipidemia, unspecified: Secondary | ICD-10-CM | POA: Diagnosis not present

## 2024-05-28 DIAGNOSIS — R739 Hyperglycemia, unspecified: Secondary | ICD-10-CM | POA: Diagnosis not present

## 2024-05-28 DIAGNOSIS — E1122 Type 2 diabetes mellitus with diabetic chronic kidney disease: Secondary | ICD-10-CM | POA: Diagnosis not present

## 2024-05-28 DIAGNOSIS — D631 Anemia in chronic kidney disease: Secondary | ICD-10-CM | POA: Diagnosis not present

## 2024-05-28 DIAGNOSIS — E66812 Obesity, class 2: Secondary | ICD-10-CM | POA: Diagnosis not present

## 2024-05-28 DIAGNOSIS — R0600 Dyspnea, unspecified: Secondary | ICD-10-CM | POA: Diagnosis not present

## 2024-05-28 DIAGNOSIS — N179 Acute kidney failure, unspecified: Secondary | ICD-10-CM | POA: Diagnosis not present

## 2024-05-28 DIAGNOSIS — R2689 Other abnormalities of gait and mobility: Secondary | ICD-10-CM | POA: Diagnosis not present

## 2024-05-28 DIAGNOSIS — Z7984 Long term (current) use of oral hypoglycemic drugs: Secondary | ICD-10-CM | POA: Diagnosis not present

## 2024-05-28 DIAGNOSIS — N182 Chronic kidney disease, stage 2 (mild): Secondary | ICD-10-CM | POA: Diagnosis not present

## 2024-05-28 DIAGNOSIS — E7800 Pure hypercholesterolemia, unspecified: Secondary | ICD-10-CM | POA: Diagnosis not present

## 2024-05-28 DIAGNOSIS — M5021 Other cervical disc displacement,  high cervical region: Secondary | ICD-10-CM | POA: Diagnosis not present

## 2024-05-28 DIAGNOSIS — I517 Cardiomegaly: Secondary | ICD-10-CM | POA: Diagnosis not present

## 2024-05-28 DIAGNOSIS — K5901 Slow transit constipation: Secondary | ICD-10-CM | POA: Diagnosis not present

## 2024-05-28 DIAGNOSIS — Z803 Family history of malignant neoplasm of breast: Secondary | ICD-10-CM | POA: Diagnosis not present

## 2024-05-28 DIAGNOSIS — I129 Hypertensive chronic kidney disease with stage 1 through stage 4 chronic kidney disease, or unspecified chronic kidney disease: Secondary | ICD-10-CM | POA: Diagnosis not present

## 2024-05-28 DIAGNOSIS — N3000 Acute cystitis without hematuria: Secondary | ICD-10-CM | POA: Diagnosis not present

## 2024-05-28 DIAGNOSIS — Z981 Arthrodesis status: Secondary | ICD-10-CM | POA: Diagnosis not present

## 2024-05-28 DIAGNOSIS — N189 Chronic kidney disease, unspecified: Secondary | ICD-10-CM | POA: Diagnosis not present

## 2024-05-28 DIAGNOSIS — M160 Bilateral primary osteoarthritis of hip: Secondary | ICD-10-CM | POA: Diagnosis not present

## 2024-05-28 DIAGNOSIS — G9389 Other specified disorders of brain: Secondary | ICD-10-CM | POA: Diagnosis not present

## 2024-05-28 DIAGNOSIS — R531 Weakness: Secondary | ICD-10-CM | POA: Diagnosis not present

## 2024-05-28 DIAGNOSIS — K5909 Other constipation: Secondary | ICD-10-CM | POA: Diagnosis not present

## 2024-05-28 DIAGNOSIS — Z79899 Other long term (current) drug therapy: Secondary | ICD-10-CM | POA: Diagnosis not present

## 2024-05-28 DIAGNOSIS — G8929 Other chronic pain: Secondary | ICD-10-CM | POA: Diagnosis not present

## 2024-05-28 DIAGNOSIS — M47817 Spondylosis without myelopathy or radiculopathy, lumbosacral region: Secondary | ICD-10-CM | POA: Diagnosis not present

## 2024-05-28 DIAGNOSIS — I10 Essential (primary) hypertension: Secondary | ICD-10-CM | POA: Diagnosis not present

## 2024-05-28 DIAGNOSIS — M50221 Other cervical disc displacement at C4-C5 level: Secondary | ICD-10-CM | POA: Diagnosis not present

## 2024-05-28 DIAGNOSIS — G238 Other specified degenerative diseases of basal ganglia: Secondary | ICD-10-CM | POA: Diagnosis not present

## 2024-05-28 DIAGNOSIS — R5381 Other malaise: Secondary | ICD-10-CM | POA: Diagnosis not present

## 2024-05-28 DIAGNOSIS — E1151 Type 2 diabetes mellitus with diabetic peripheral angiopathy without gangrene: Secondary | ICD-10-CM | POA: Diagnosis not present

## 2024-05-28 DIAGNOSIS — N1832 Chronic kidney disease, stage 3b: Secondary | ICD-10-CM | POA: Diagnosis not present

## 2024-05-28 DIAGNOSIS — Z888 Allergy status to other drugs, medicaments and biological substances status: Secondary | ICD-10-CM | POA: Diagnosis not present

## 2024-05-28 DIAGNOSIS — E119 Type 2 diabetes mellitus without complications: Secondary | ICD-10-CM | POA: Diagnosis not present

## 2024-05-28 DIAGNOSIS — I120 Hypertensive chronic kidney disease with stage 5 chronic kidney disease or end stage renal disease: Secondary | ICD-10-CM | POA: Diagnosis not present

## 2024-05-28 DIAGNOSIS — R001 Bradycardia, unspecified: Secondary | ICD-10-CM | POA: Diagnosis not present

## 2024-05-28 DIAGNOSIS — M4802 Spinal stenosis, cervical region: Secondary | ICD-10-CM | POA: Diagnosis not present

## 2024-05-28 DIAGNOSIS — M810 Age-related osteoporosis without current pathological fracture: Secondary | ICD-10-CM | POA: Diagnosis not present

## 2024-05-28 DIAGNOSIS — D638 Anemia in other chronic diseases classified elsewhere: Secondary | ICD-10-CM | POA: Diagnosis not present

## 2024-05-28 DIAGNOSIS — M25551 Pain in right hip: Secondary | ICD-10-CM | POA: Diagnosis not present

## 2024-05-28 DIAGNOSIS — M47812 Spondylosis without myelopathy or radiculopathy, cervical region: Secondary | ICD-10-CM | POA: Diagnosis not present

## 2024-05-28 DIAGNOSIS — N390 Urinary tract infection, site not specified: Secondary | ICD-10-CM | POA: Diagnosis not present

## 2024-05-28 DIAGNOSIS — R609 Edema, unspecified: Secondary | ICD-10-CM | POA: Diagnosis not present

## 2024-05-28 DIAGNOSIS — Z471 Aftercare following joint replacement surgery: Secondary | ICD-10-CM | POA: Diagnosis not present

## 2024-05-28 DIAGNOSIS — M47816 Spondylosis without myelopathy or radiculopathy, lumbar region: Secondary | ICD-10-CM | POA: Diagnosis not present

## 2024-05-28 DIAGNOSIS — Z6835 Body mass index (BMI) 35.0-35.9, adult: Secondary | ICD-10-CM | POA: Diagnosis not present

## 2024-05-28 DIAGNOSIS — Z87440 Personal history of urinary (tract) infections: Secondary | ICD-10-CM | POA: Diagnosis not present

## 2024-05-28 DIAGNOSIS — B952 Enterococcus as the cause of diseases classified elsewhere: Secondary | ICD-10-CM | POA: Diagnosis not present

## 2024-05-28 DIAGNOSIS — Z881 Allergy status to other antibiotic agents status: Secondary | ICD-10-CM | POA: Diagnosis not present

## 2024-05-28 DIAGNOSIS — K5900 Constipation, unspecified: Secondary | ICD-10-CM | POA: Diagnosis not present

## 2024-05-28 DIAGNOSIS — M48061 Spinal stenosis, lumbar region without neurogenic claudication: Secondary | ICD-10-CM | POA: Diagnosis not present

## 2024-05-28 DIAGNOSIS — F03A0 Unspecified dementia, mild, without behavioral disturbance, psychotic disturbance, mood disturbance, and anxiety: Secondary | ICD-10-CM | POA: Diagnosis not present

## 2024-05-28 DIAGNOSIS — D509 Iron deficiency anemia, unspecified: Secondary | ICD-10-CM | POA: Diagnosis not present

## 2024-06-01 ENCOUNTER — Other Ambulatory Visit (HOSPITAL_BASED_OUTPATIENT_CLINIC_OR_DEPARTMENT_OTHER): Payer: Self-pay

## 2024-06-04 NOTE — Progress Notes (Signed)
 [facility] Hospitalist  PROGRESS NOTE Epic Chat Secure Text preferred   Today's Date: 06/04/2024  Code Status: Full Code Patient's PCP: Wanda KANDICE Shave, MD Allergies: Bactrim [sulfamethoxazole-trimethoprim] and Januvia [sitagliptin] Isolation: No active isolations  Subjective:   Pt states she hasn't had BM in a week She reports this is normal for her Still no BM today No abdominal cramps today No n/v No CP No SOB No subjective fever No chills Awaiting SNF placement  8/5:  I personally discussed pt's case with rehab physician who requests MRI c-spine prior to any transfer to their facility.  They are concerned with the reported suddenness of pt's generalized weakness.  Per chart review, pt has been maximum assistance since her first physical therapy evaluation 7/31 during this admission (admitted for lethargy/generalized weakness thought 2/2 UTI sequela), though pt's family reports she was ambulatory with RW and able to climb stairs prior to admission.  Obtaining a MRI c-spine is reasonable.  (MRI brain without acute intracranial abnormality.  MRI lumbar spine with mild - moderate foraminal stenosis but no cord compression.)  Clinical Events Summary: Patient is a 89 year old female with past medical history of type 2 diabetes, CKD stage III, hypertension, hyperlipidemia, osteoporosis who presented to [place] on 05/28/2024 for evaluation of lethargy.  She had been taking Keflex  for a UTI and was on day 9/10.  She had presented to urgent care on the day prior to presentation was given a dose of Rocephin  and started on a course of Cipro .  Her symptoms persisted prompting her to come to the ED.  Urinalysis was suggestive of UTI and patient was admitted for further evaluation.  Patient was initially treated with Rocephin , however urine culture did not grow anything and antibiotics were discontinued.  MRI brain and MRI of the lumbar spine were performed for further evaluation to identify  etiology of patient's weakness, however both were without acute findings.  It was determined the patient would benefit from placement at skilled nursing facility for subacute rehab.  ROS:  All systems reviewed and are negative except as noted above.   Objective:   BP 178/64 (BP Location: Left arm, Patient Position: Lying)   Pulse 64   Temp 36.7 C (98.1 F) (Temporal)   Resp 16   Ht 1.702 m (5' 7)   Wt 215 lb 13.3 oz (97.9 kg)   SpO2 95%   BMI 33.80 kg/m   Gen:  alert, NAD HEENT:  [place]/AT, PERRL, no scleral icterus, No obvious hearing loss, no nasal bleeding, moist mucous membranes CV:  RRR, no edema Lungs:  CTA B/L Abd: +BS, soft, NT on exam today, ND Ext: no rash, no erythema  Neuro:  alert, generalized weakness, no focal deficits  Psych: pleasant, calm  Labs: Personally reviewed by me today    Assessment / Plan: Assessment & Plan Weakness MRI brain and MRI lumbar spine were negative for acute process to explain patient's weakness.  Disposition will be likely physical rehab.  Rehab physician requests MRI c-spine.  This has been ordered.   Continue PT/OT Urinary retention Myrbetriq  discontinued Urinary tract infection S/p therapy, Ucx negative  Constipation - bowel regimen - obtain KUB  Chronic kidney disease, stage 3b (HCC) Creatinine at approximate baseline Type 2 diabetes mellitus, without long-term current use of insulin  (HCC) Blood sugar within acceptable range.  Continue sliding scale insulin  and monitor for hypo-/hyperglycemia with ACHS point-of-care blood glucose with use of insulin . Hypertension Blood pressure has been somewhat elevated.  Continue home hydralazine , Bystolic ,  and increase amlodipine  to 10 mg daily Dyslipidemia Continue home rosuvastatin  Non-ischemic myocardial injury (non-traumatic) Denies chest pain   Statement of Medical Necessity:   Patient with ongoing weakness, disposition will likely be SNF  Alana D Piersanti, MD

## 2024-06-04 NOTE — Progress Notes (Signed)
 Occupational Therapy Occupational Therapy Visit  Patient Name: [name] Mathews MRN: [id number] Today's Date: 06/04/2024  Subjective  Objective General Visit Information: Pt in room 4504, cleared by RN Joen for OT tx. Pt was in bed with her daughter at her side. Pt stated she wasn't feeling that good but would do what she could at bed level. OT Last Visit OT Received On: 06/04/24 General Treatment Duration (min): 10 Minutes Family/Caregiver Present: Yes (Daughter)  ADL Assessment/Treatment: Pt worked on simulating simple grooming task at bed level. Pt held tooth brush but declined to brush her teeth at this time as she just didn't feel up to it. Pt was able to move her hands as if washing her face off with wash cloth but also declined that as well at this time. With these two simple task pt did require increased time and a short rest break as she stated she was just so tired this afternoon.  Pt apologized for feeling up doing anything but stated she just didn't feel good and hopes tomorrow she will feel better. COTA acknowledged and conveyed appreciation for what the pt did do.   Assessment & Plan  Pt was left in bed with bed alarm on and call bell in hand and all needs met. Carry on with OT POC to increase overall functional activity tolerance and to increase her ability to assist with self care and functional transfers.     Encounter Problems     Encounter Problems (Active)     OT Problem     Patient will demonstrate lower body dressing tasks with no more than min A      Start:  06/03/24    Expected End:  06/24/24         Patient will perform commode transfer with no assistance     Start:  06/03/24    Expected End:  06/24/24         Patient will complete clothing management with toileting no assistance     Start:  06/03/24    Expected End:  06/24/24         Patient will complete toileting hygiene with no assistance     Start:  06/03/24    Expected End:  06/24/24

## 2024-06-05 ENCOUNTER — Encounter: Payer: Self-pay | Admitting: Physical Therapy

## 2024-06-05 NOTE — Progress Notes (Signed)
 [facility] Hospitalist  PROGRESS NOTE Epic Chat Secure Text preferred   Today's Date: 06/05/2024  Code Status: Full Code Patient's PCP: Wanda KANDICE Shave, MD Allergies: Bactrim [sulfamethoxazole-trimethoprim] and Januvia [sitagliptin] Isolation: No active isolations  Subjective:   Pt states she hasn't had BM in a week She reports this is normal for her Still no BM today XR abd series unremarkable Starting enema in addition to ongoing bowel regimen  Clinical Events Summary: Patient is a 89 year old female with past medical history of type 2 diabetes, CKD stage III, hypertension, hyperlipidemia, osteoporosis who presented to [place] on 05/28/2024 for evaluation of lethargy.  She had been taking Keflex  for a UTI and was on day 9/10.  She had presented to urgent care on the day prior to presentation was given a dose of Rocephin  and started on a course of Cipro .  Her symptoms persisted prompting her to come to the ED.  Urinalysis was suggestive of UTI and patient was admitted for further evaluation.  Patient was initially treated with Rocephin , however urine culture did not grow anything and antibiotics were discontinued.  MRI brain and MRI of the lumbar spine were performed for further evaluation to identify etiology of patient's weakness, however both were without acute findings.  It was determined the patient would benefit from placement at skilled nursing facility for subacute rehab.  8/5:  I personally discussed pt's case with rehab physician who requests MRI c-spine prior to any transfer to their facility.  They are concerned with the reported suddenness of pt's generalized weakness.  Per chart review, pt has been maximum assistance since her first physical therapy evaluation 7/31 during this admission (admitted for lethargy/generalized weakness thought 2/2 UTI sequela), though pt's family reports she was ambulatory with RW and able to climb stairs prior to admission.  Obtaining a MRI c-spine  is reasonable.  (MRI brain without acute intracranial abnormality.  MRI lumbar spine with mild - moderate foraminal stenosis but no cord compression.)    8/6:  MRI c-spine:  Multilevel spondylosis. There is moderate central canal stenosis at C5-C6 and C6-C7. There is multilevel foraminal stenosis.  No noted cord compression.  ROS:  All systems reviewed and are negative except as noted above.   Objective:   BP 169/65 (BP Location: Left arm, Patient Position: Sitting)   Pulse 60   Temp 36.3 C (97.3 F) (Temporal)   Resp 17   Ht 1.702 m (5' 7)   Wt 215 lb 13.3 oz (97.9 kg)   SpO2 97%   BMI 33.80 kg/m   Gen:  alert, NAD, currently attempted to have BM in bedside commode CV:  HR as noted Lungs:  nl effort Neuro:  alert Psych: calm  Labs: Personally reviewed by me today    Assessment / Plan: Assessment & Plan Weakness MRI brain and MRI lumbar spine and MRI c-spine were negative for acute process to explain patient's weakness.  Disposition will be likely physical rehab. Continue PT/OT Urinary retention Myrbetriq  discontinued Urinary tract infection S/p therapy, Ucx negative  Constipation - bowel regimen - Ab XR series unremarkable - start enema  Chronic kidney disease, stage 3b (HCC) Creatinine at approximate baseline Type 2 diabetes mellitus, without long-term current use of insulin  (HCC) Blood sugar within acceptable range.  Continue sliding scale insulin  and monitor for hypo-/hyperglycemia with ACHS point-of-care blood glucose with use of insulin . Hypertension Blood pressure has been somewhat elevated.  Continue home hydralazine , Bystolic , and increase amlodipine  to 10 mg daily Dyslipidemia Continue home rosuvastatin   Non-ischemic myocardial injury (non-traumatic) Denies chest pain   Statement of Medical Necessity:   Patient with ongoing weakness, disposition will likely be SNF  Alana D Piersanti, MD

## 2024-06-05 NOTE — Progress Notes (Signed)
 Physical Therapy Physical Therapy Visit  Patient Name: [name] Mathews MRN: [id number] Today's Date: 06/05/2024  Subjective Current Problem: Room 4504. Pt was found in bed when PTA entered room, agreeable to PT. Pt reports having no pain at start of treatment.  Pain: Pain Assessment Pain Assessment: No/denies pain      Objective General Visit Information: PT Last Visit PT Received On: 06/05/24   Precautions: Precautions Medical Precautions: fall risk; cognition General Assessments:   Cognition Overall Cognitive Status: Impaired Following Commands: Follows one step commands with increased time Treatment: Therapeutic Activity Therapeutic Activity 1: Pt performed bed mobility from supine>sit with mod A for trunk and BLEs with VI for technique, pt able to work on scooting hips towards EOB with VI for technique and min A for trunk support. pt performed STS from EOB with mod A with VI for hand placement/technique and was able to t/f to [facility] with min-mod A with VI for technique/sequencing and rw management assist. pt lowered with mod A and attempted to have BM while NSG performed hygiene care. Pt STS from [facility] with mod A and chair was brought up behind pt and seated into recliner with min-mod A with VI for hand placement/technique. pt made comfortable  Assessment & Plan PT Assessment Evaluation/Treatment Tolerance: Patient tolerated treatment well Plan PT Plan: Skilled PT PT - Next Appointment: 06/06/24 Pt was left in chair with all needs in reach; alarm on.  Encounter Problems     Encounter Problems (Active)     PT Problem     Patient will ambulate 25 distance using walker with minimal assistance     Start:  05/30/24    Expected End:  06/06/24         Patient will perform chair to and from bed transfer with minimal assistance     Start:  05/30/24    Expected End:  06/06/24         Patient will perform supine to sit on bed with minimal assistance demonstrating control      Start:  05/30/24    Expected End:  06/06/24         Patient will perform sit to supine on bed with minimal assistance demonstrating control     Start:  05/30/24    Expected End:  06/06/24           Pain - Adult

## 2024-06-05 NOTE — PMR Pre-admission (Signed)
 PMR Admission Coordinator Pre-Admission Assessment  Patient: [name] Mathews is an 89 y.o., female MRN: [id number] DOB: [birth year] Height:   Weight:    Insurance Information HMO:     PPO:      PCP:      IPA:      80/20:      OTHER:  PRIMARY: Medicare Part A and B      Policy#: [id number]       Subscriber: pt CM Name:       Phone#:      Fax#:  Pre-Cert#: verified Health and safety inspector:  Benefits:  Phone #:      Name:  Eff. Date: A and B 10/31/2000     Deduct: $1676      Out of Pocket Max: n/a      Life Max: n/a CIR: 100%      SNF: 20 full days Outpatient: 80%     Co-Pay: 20% Home Health: 100%      Co-Pay:  DME: 80%     Co-Pay: 20% Providers:  SECONDARY: [employer]      Policy#: [id number]      Phone#:   Financial Counselor:       Phone#:   The "Data Collection Information Summary" for patients in Inpatient Rehabilitation Facilities with attached "Privacy Act Statement-Health Care Records" was provided and verbally reviewed with: {CHL IP Patient Family WJ:[id number]}  Emergency Contact Information Contact Information     Name Relation Home Work Mobile   Crawford,[name] Daughter [phone]        Other Contacts     Name Relation Home Work Mobile   [place] Daughter [phone]         Current Medical History  Patient Admitting Diagnosis: debility   History of Present Illness: Pt is an 89 y/o female with PMH of DM, CKD, HTN, normocytic anemia, and lumbar radiculopathy s/p PLIF on 06/05/2019 per Dr. Joshua and did receive CIR services at the time, and frequent UTIs, who was admitted to [facility] on 05/28/24 with c/o ongoing UTI symptoms.  Per outside records, pt had been diagnosed with UTI by per PCP and prescribed 10 day course of abx.  On day 9 her daughter brought her to urgent care as pt appeared to be getting weaker and more confused.  The next day she was admitted to the hospital.  UTI workup was negative and antibiotics stopped.  She's had a CT of her bladder to evaluate for  blockage which may be causing her symptoms of  UTI.  MRI cervical spine also completed to r/o cervical myelopathy and per report was clear.  Last BM prior to admission.  Daughter felt needed further workup which was not provided for pt at current facility.  Therapy evaluations were completed and pt was recommended for CIR due to significant/rapid functional decline.     Patient's medical record from [facility] has been reviewed by the rehabilitation admission coordinator and physician.  Past Medical History  Past Medical History:  Diagnosis Date   Anemia    hx of   Arthritis    Breast lump    hx of   Cataract    left eye   Chronic back pain    Diabetes mellitus    Hyperlipidemia    Hypertension    sees Dr. 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 No  Family History   family history includes Breast cancer (age of onset: 108) in her sister.  Current Medications  Current Outpatient Medications:    acetaminophen  (TYLENOL ) 325 MG tablet, Take 2 tablets (650 mg total) by mouth every 4 (four) hours as needed for mild pain ((score 1 to 3) or temp > 100.5)., Disp:  , Rfl:    amLODipine  (NORVASC ) 5 MG tablet, Take 1 tablet (5 mg total) by mouth every evening., Disp: 90 tablet, Rfl: 3   aspirin  81 MG tablet, Take 81 mg by mouth daily., Disp: , Rfl:    Bacillus Coagulans-Inulin (ALIGN PREBIOTIC-PROBIOTIC PO), Take by mouth., Disp: , Rfl:    cefdinir  (OMNICEF ) 300 MG capsule, Take 1 capsule (300 mg total) by mouth 2 (two) times daily., Disp: 14 capsule, Rfl: 0   Cholecalciferol (VITAMIN D3) 125 MCG (5000 UT) TABS, Take 1 tablet (5,000 Units total) by mouth daily., Disp: 30 tablet,  Rfl: 0   COVID-19 mRNA Vac-TriS, Pfizer, (COMIRNATY ) SUSP injection, Inject into the muscle., Disp: 0.3 mL, Rfl: 0   cyclobenzaprine  (FLEXERIL ) 10 MG tablet, Take 1 tablet (10 mg total) by mouth 3 (three) times daily as needed for muscle spasms., Disp: 60 tablet, Rfl: 0   docusate sodium  (COLACE) 250 MG capsule, Take 250 mg by mouth daily., Disp: , Rfl:    Ferrous Sulfate  (SLOW RELEASE IRON PO), Take 1 tablet by mouth daily., Disp: , Rfl:    fluconazole  (DIFLUCAN ) 200 MG tablet, Take 1 tablet by mouth as needed for yeast infection, no more than 1 dose in 3 days., Disp: 5 tablet, Rfl: 1   fluticasone  (FLONASE ) 50 MCG/ACT nasal spray, Place 1 spray into both nostrils daily as needed for allergies or rhinitis., Disp: , Rfl:    fluticasone  (FLONASE ) 50 MCG/ACT nasal spray, Spray 2 sprays into each nostril every day once daily., Disp: 16 g, Rfl: 3   fluticasone  (FLONASE ) 50 MCG/ACT nasal spray, Place 2 sprays into both nostrils daily., Disp: 48 g, Rfl: 3   galantamine  (RAZADYNE  ER) 8 MG 24 hr capsule, Take 1 capsule (8 mg total) by mouth every morning with food., Disp: 90 capsule, Rfl: 3   galantamine  (RAZADYNE  ER) 8 MG 24 hr capsule, Take 1 capsule (8 mg total) by mouth every morning with food., Disp: 90 capsule, Rfl: 3   glucose blood (ONETOUCH VERIO) test strip, Use 1 test strip to check blood glucose once a day., Disp: 100 each, Rfl: 3   hydrALAZINE  (APRESOLINE ) 25 MG tablet, Take 1 tablet (25 mg total) by mouth 2 (two) times daily., Disp: 180 tablet, Rfl: 3   hydrocortisone  (ANUCORT-HC ) 25 MG suppository, Place 1 suppository (25 mg total) rectally 3 (three) times daily., Disp: 30 suppository, Rfl: 0   Lactobacillus-Inulin (PROBIOTIC DIGESTIVE SUPPORT PO), Take 2 each by mouth every morning., Disp: , Rfl:    Lancets (ONETOUCH DELICA PLUS LANCET33G) MISC, Apply 1 each topically daily., Disp: , Rfl:    Lidocaine -Hydrocort , Perianal, 3-0.5 % CREA, Apply to rectum 2 (two) times daily as needed  externally., Disp: 28.35 g, Rfl: 1   linaclotide  (LINZESS ) 145 MCG CAPS capsule, Take 1 capsule (145 mcg total) by mouth daily without food., Disp: 90 capsule, Rfl: 4   mirabegron  ER (MYRBETRIQ ) 50 MG TB24 tablet, Take 1 tablet (50  mg total) by mouth daily., Disp: 90 tablet, Rfl: 3   montelukast  (SINGULAIR ) 10 MG tablet, Take 1 tablet (10 mg total) by mouth daily., Disp: 90 tablet, Rfl: 4   montelukast  (SINGULAIR ) 10 MG tablet, Take 1 tablet (10 mg total) by mouth daily., Disp: 90 tablet, Rfl: 4   Multiple Vitamins-Minerals (MULTIVITAMIN PO), Take 2 each by mouth daily. , Disp: , Rfl:    nebivolol  (BYSTOLIC ) 5 MG tablet, Take 1 tablet (5 mg total) by mouth at bedtime., Disp: 30 tablet, Rfl: 11   ONETOUCH VERIO test strip, USE 1 TEST STRIP TO CHECK BLOOD GLUCOSE ONCE A DAY DX  E11.9, Disp: , Rfl:    pioglitazone  (ACTOS ) 30 MG tablet, Take 1 tablet (30 mg total) by mouth daily., Disp: 30 tablet, Rfl: 0   rosuvastatin  (CRESTOR ) 5 MG tablet, Take 1 tablet (5 mg total) by mouth daily., Disp: 30 tablet, Rfl: 0   rosuvastatin  (CRESTOR ) 5 MG tablet, Take 1 tablet (5 mg total) by mouth daily., Disp: 90 tablet, Rfl: 4   rosuvastatin  (CRESTOR ) 5 MG tablet, Take 1 tablet (5 mg total) by mouth daily., Disp: 90 tablet, Rfl: 3   RSV vaccine recomb adjuvanted (AREXVY ) 120 MCG/0.5ML injection, Inject into the muscle., Disp: 0.5 mL, Rfl: 0   vitamin B-12 (CYANOCOBALAMIN ) 1000 MCG tablet, Take 1,000 mcg by mouth daily. OTC, Disp: , Rfl:    vitamin C  (ASCORBIC ACID) 500 MG tablet, Take 500 mg by mouth daily., Disp: , Rfl:   Patients Current Diet: Diet reg/thin  Precautions / Restrictions     Has the patient had 2 or more falls or a fall with injury in the past year? No  Prior Activity Level Household: distant supervision for ambulation at home with RW, did have an aid 9-4 to help with bathing/dressing, lives with daughter, Angeline   Prior Functional Level Self Care: Did the patient need help bathing,  dressing, using the toilet or eating? Needed some help  Indoor Mobility: Did the patient need assistance with walking from room to room (with or without device)? Independent  Stairs: Did the patient need assistance with internal or external stairs (with or without device)? Needed some help  Functional Cognition: Did the patient need help planning regular tasks such as shopping or remembering to take medications? Needed some help  Patient Information Are you of Hispanic, Latino/a,or Spanish origin?: A. No, not of Hispanic, Latino/a, or Spanish origin What is your race?: B. Black or African American Do you need or want an interpreter to communicate with a doctor or health care staff?: 0. No  Patient's Response To:  Health Literacy and Transportation Is the patient able to respond to health literacy and transportation needs?: No (outside market, completed over phone and pt unable to speak on phone) In the past 12 months, has lack of transportation kept you from medical appointments or from getting medications?: No In the past 12 months, has lack of transportation kept you from meetings, work, or from getting things needed for daily living?: No Health Literacy and Transportation obtained via proxy: per daughter  Journalist, newspaper / Biochemist, clinical (specify type)   Prior Device Use: Indicate devices/aids used by the patient prior to current illness, exacerbation or injury? Walker   Prior Functional Level Current Functional Level  Bed Mobility  Mod Independent Max assist   Transfers  Mod Independent  Max assist   Mobility - Walk/Wheelchair  Mod Independent  -- (unable)   Upper Body Dressing  Min assist  Max assist   Lower Body Dressing  Min assist  Max assist   Grooming  Min assist  Max assist   Eating/Drinking  Mod Independent  Other (not documented)   Toilet Transfer  Mod Independent  Max assist   Bladder Continence   continent      Bowel Management   continent      Stair Climbing     -- (unable)   Communication  indep      Memory          Special considerations/life events Diabetic management yes  Previous Home Environment (from acute therapy documentation) Living Arrangements: Children  Lives With: Daughter Available Help at Discharge: Available 24 hours/day; Personal care attendant; Family Type of Home: House Home Layout: Able to live on main level with bedroom/bathroom Home Access: Stairs to enter Entrance Stairs-Rails: Left Entrance Stairs-Number of Steps: 4 Bathroom Shower/Tub: Health visitor: Handicapped height Bathroom Accessibility: Yes How Accessible: Accessible via walker Home Care Services: Yes Type of Home Care Services: Other (Comment) (aid)   Discharge Living Setting Plans for Discharge Living Setting: Lives with (comment) (daughter Angeline) Type of Home at Discharge: House Discharge Home Layout: Able to live on main level with bedroom/bathroom Discharge Home Access: Stairs to enter Entrance Stairs-Rails: Left Entrance Stairs-Number of Steps: 4 Discharge Bathroom Shower/Tub: Walk-in shower Discharge Bathroom Toilet: Handicapped height Discharge Bathroom Accessibility: Yes How Accessible: Accessible via walker Does the patient have any problems obtaining your medications?: No   Social/Family/Support Systems Anticipated Caregiver: Daughter Angeline Anticipated Industrial/product designer Information: [phone] Ability/Limitations of Caregiver: none stated Caregiver Availability: 24/7 Discharge Plan Discussed with Primary Caregiver: Yes Is Caregiver In Agreement with Plan?: Yes Does Caregiver/Family have Issues with Lodging/Transportation while Pt is in Rehab?: No   Goals Patient/Family Goal for Rehab: PT/OT min assist, SLP N/a Expected length of stay: 21-24 days Additional Information: Discharge plan: return home with daughter Angeline as per baseline Pt/Family Agrees to Admission  and willing to participate: Yes Program Orientation Provided & Reviewed with Pt/Caregiver Including Roles  & Responsibilities: Yes Additional Information Needs: previous stay on CIR x2 (2013 and 2020)   Decrease burden of Care through IP rehab admission: n/a  Possible need for SNF placement upon discharge:  Not anticipated.  Pt with excellent family support and plan to discharge back to previous living situation with daughter and hired caregivers providing 24/7.   Patient Condition: I have reviewed medical records from [facility], spoken with CM, and daughter. I discussed via phone for inpatient rehabilitation assessment.  Patient will benefit from ongoing PT and OT, can actively participate in 3 hours of therapy a day 5 days of the week, and can make measurable gains during the admission.  Patient will also benefit from the coordinated team approach during an Inpatient Acute Rehabilitation admission.  The patient will receive intensive therapy as well as Rehabilitation physician, nursing, social worker, and care management interventions.  Due to bladder management, bowel management, safety, skin/wound care, disease management, medication administration, pain management, and patient education the patient requires 24 hour a day rehabilitation nursing.  The patient is currently max assist with mobility and basic ADLs.  Discharge setting and therapy post discharge at home is anticipated.  Patient has agreed to participate in the Acute Inpatient Rehabilitation Program and will admit {Time; today/tomorrow:10263}.  Preadmission Screen Completed By:  Reche FORBES Lowers, 06/05/2024 4:43 PM ______________________________________________________________________   Discussed status with Dr. PIERRETTE on *** at *** and received approval for admission today.  Admission Coordinator:  Auriel Kist E Aften Lipsey, PT, time PIERRETTEPattricia ***   Assessment/Plan: Diagnosis: *** Does the need for close, 24 hr/day Medical supervision in concert  with the patient's rehab needs make it unreasonable for this patient to be served in a less intensive setting? {yes_no_potentially:3041433} Co-Morbidities requiring supervision/potential complications: *** Due to {due un:6958565}, does the patient require 24 hr/day rehab nursing? {yes_no_potentially:3041433} Does the patient require coordinated care of a physician, rehab nurse, PT, OT, and SLP to address physical and functional deficits in the context of the above medical diagnosis(es)? {yes_no_potentially:3041433} Addressing deficits in the following areas: {deficits:3041436} Can the patient actively participate in an intensive therapy program of at least 3 hrs of therapy 5 days a week? {yes_no_potentially:3041433} The potential for patient to make measurable gains while on inpatient rehab is {potential:3041437} Anticipated functional outcomes upon discharge from inpatient rehab: {functional outcomes:304600100} PT, {functional outcomes:304600100} OT, {functional outcomes:304600100} SLP Estimated rehab length of stay to reach the above functional goals is: *** Anticipated discharge destination: {anticipated dc setting:21604} 10. Overall Rehab/Functional Prognosis: {potential:3041437}  MD Signature ***

## 2024-06-06 NOTE — Progress Notes (Signed)
 Physician Clarification,  I have reviewed the patient's chart and the following accurately represents the patient's condition/diagnosis.   Diagnosis: AKI superimposed on CKD 3b (GFR 30-44)     Clinical Information: Creatinine increased > 0.3 over 48 hours on 7/30 to 8/1, from 1.52 to 1.89 PMH CKD stage 3, urinary retention on Myrbique (medication held) who was treated for UTI with Rocephin  and Cipro .  8/3 Progress note: Chronic kidney disease, stage 3b  Creatinine at approximate baseline  Creatinine: 7/29: 1.61 7/30: 1.52 7/31: 1.47 8/1: 1.89 8/2: 1.50 8/3: 1.36  Daily lab monitoring.  This documentation will become part of the patient's medical record.

## 2024-06-06 NOTE — Care Plan (Signed)
 The patient is Moderately Stable - Low risk of patient condition declining or worsening  Assumed care of patient at 1900. Patient is AO to self and place with daughter at the bedside all night. Medicated per MAR, no adverse reactions noted at time of adm. Patient has slept for most of the night. Insulin  coverage refused by family. Foley intact, draining clear, yellow urine. No bowel movement tonight. No acute events overnight. All safety measures in place. Will report to oncoming nurse.

## 2024-06-06 NOTE — Progress Notes (Signed)
 Occupational Therapy  Pt in rm 4504 and receiving pt care at time of treatment attempt; will continue to follow for treatment as outlined in the POC

## 2024-06-06 NOTE — Progress Notes (Signed)
 Physical Therapy Physical Therapy Visit  Patient Name: [name] Mathews MRN: [id number] Today's Date: 06/06/2024  Subjective Current Problem: Room 4504. Pt was found in bed when PTA entered room, reluctantly agreeable to treatment with enc/mot.  Pain: Pain Assessment Pain Assessment: No/denies pain      Objective General Visit Information: PT Last Visit PT Received On: 06/06/24   Precautions: Precautions Medical Precautions: fall risk; cognition General Assessments:   Cognition Overall Cognitive Status: Impaired Orientation Level: Disoriented to time Following Commands: Follows one step commands with increased time Safety Judgment: Decreased awareness of need for assistance Treatment: Therapeutic Activity Therapeutic Activity 1: pt performed bed mobility from supine>sit with VI for log-roll technique with min-mod A for trunk assist to upright and guidance of BLE off EOB, mod A for STS up to rw with VI for hand placement/technique. pt utilized rw to amb ~75ft over to recliner with VI for sequencing and able to turn and lower with VI for hand placement/technique with min A. pt with flexed posture during, slow movements, good clearance of BLEs. pt performed seated BLE AP x10 reps, LAQ x10 reps, hip flex x10 reps, hip abd/add x10 reps. BUE shoulder flex x10 reps with VI and TI provided for technique during.  Assessment & Plan PT Assessment Evaluation/Treatment Tolerance: Patient tolerated treatment well Plan PT Plan: Skilled PT PT - Next Appointment: 06/07/24 Pt was left in chair with all needs in reach; alarm on.  Encounter Problems     Encounter Problems (Active)     PT Problem     Patient will ambulate 25 distance using walker with minimal assistance     Start:  05/30/24    Expected End:  06/06/24         Patient will perform chair to and from bed transfer with minimal assistance     Start:  05/30/24    Expected End:  06/06/24         Patient will perform supine to sit  on bed with minimal assistance demonstrating control     Start:  05/30/24    Expected End:  06/06/24         Patient will perform sit to supine on bed with minimal assistance demonstrating control     Start:  05/30/24    Expected End:  06/06/24           Pain - Adult

## 2024-06-06 NOTE — Progress Notes (Addendum)
 Patient was accepted to [facility].  I am working on transportation arrangements.  We are planning for discharge tomorrow.  I updated patient's daughter via telephone.  1037 Transport arranged with [facility] for 06/06/2024 at 0900. I updated patient's daughter, nursing staff and MD.  I also informed Reche at [facility] of transport time for tomorrow.

## 2024-06-07 ENCOUNTER — Encounter (HOSPITAL_COMMUNITY): Payer: Self-pay | Admitting: Physical Medicine & Rehabilitation

## 2024-06-07 ENCOUNTER — Other Ambulatory Visit: Payer: Self-pay

## 2024-06-07 ENCOUNTER — Inpatient Hospital Stay (HOSPITAL_COMMUNITY)
Admission: AD | Admit: 2024-06-07 | Discharge: 2024-06-29 | DRG: 945 | Disposition: A | Discharge status: Home Health | Source: Other Acute Inpatient Hospital | Attending: Physical Medicine & Rehabilitation | Admitting: Physical Medicine & Rehabilitation

## 2024-06-07 ENCOUNTER — Inpatient Hospital Stay (HOSPITAL_COMMUNITY): Admission: AD | Admit: 2024-06-07 | Source: Intra-hospital | Admitting: Physical Medicine & Rehabilitation

## 2024-06-07 DIAGNOSIS — Z7984 Long term (current) use of oral hypoglycemic drugs: Secondary | ICD-10-CM

## 2024-06-07 DIAGNOSIS — I959 Hypotension, unspecified: Secondary | ICD-10-CM | POA: Diagnosis not present

## 2024-06-07 DIAGNOSIS — E785 Hyperlipidemia, unspecified: Secondary | ICD-10-CM | POA: Diagnosis present

## 2024-06-07 DIAGNOSIS — N182 Chronic kidney disease, stage 2 (mild): Secondary | ICD-10-CM | POA: Diagnosis present

## 2024-06-07 DIAGNOSIS — Z7982 Long term (current) use of aspirin: Secondary | ICD-10-CM

## 2024-06-07 DIAGNOSIS — E1169 Type 2 diabetes mellitus with other specified complication: Secondary | ICD-10-CM | POA: Diagnosis not present

## 2024-06-07 DIAGNOSIS — N184 Chronic kidney disease, stage 4 (severe): Secondary | ICD-10-CM | POA: Diagnosis not present

## 2024-06-07 DIAGNOSIS — Z803 Family history of malignant neoplasm of breast: Secondary | ICD-10-CM | POA: Diagnosis not present

## 2024-06-07 DIAGNOSIS — M169 Osteoarthritis of hip, unspecified: Secondary | ICD-10-CM | POA: Insufficient documentation

## 2024-06-07 DIAGNOSIS — Z981 Arthrodesis status: Secondary | ICD-10-CM

## 2024-06-07 DIAGNOSIS — I129 Hypertensive chronic kidney disease with stage 1 through stage 4 chronic kidney disease, or unspecified chronic kidney disease: Secondary | ICD-10-CM | POA: Diagnosis present

## 2024-06-07 DIAGNOSIS — Z6835 Body mass index (BMI) 35.0-35.9, adult: Secondary | ICD-10-CM | POA: Diagnosis not present

## 2024-06-07 DIAGNOSIS — M160 Bilateral primary osteoarthritis of hip: Secondary | ICD-10-CM | POA: Diagnosis present

## 2024-06-07 DIAGNOSIS — R339 Retention of urine, unspecified: Secondary | ICD-10-CM | POA: Diagnosis not present

## 2024-06-07 DIAGNOSIS — Z029 Encounter for administrative examinations, unspecified: Secondary | ICD-10-CM | POA: Diagnosis not present

## 2024-06-07 DIAGNOSIS — N189 Chronic kidney disease, unspecified: Secondary | ICD-10-CM | POA: Insufficient documentation

## 2024-06-07 DIAGNOSIS — E119 Type 2 diabetes mellitus without complications: Secondary | ICD-10-CM | POA: Diagnosis not present

## 2024-06-07 DIAGNOSIS — E1151 Type 2 diabetes mellitus with diabetic peripheral angiopathy without gangrene: Secondary | ICD-10-CM | POA: Diagnosis present

## 2024-06-07 DIAGNOSIS — E66812 Obesity, class 2: Secondary | ICD-10-CM | POA: Diagnosis present

## 2024-06-07 DIAGNOSIS — N179 Acute kidney failure, unspecified: Secondary | ICD-10-CM | POA: Diagnosis not present

## 2024-06-07 DIAGNOSIS — Z79899 Other long term (current) drug therapy: Secondary | ICD-10-CM | POA: Diagnosis not present

## 2024-06-07 DIAGNOSIS — K5900 Constipation, unspecified: Secondary | ICD-10-CM | POA: Diagnosis present

## 2024-06-07 DIAGNOSIS — K5901 Slow transit constipation: Secondary | ICD-10-CM | POA: Diagnosis not present

## 2024-06-07 DIAGNOSIS — K5909 Other constipation: Secondary | ICD-10-CM | POA: Diagnosis present

## 2024-06-07 DIAGNOSIS — N390 Urinary tract infection, site not specified: Secondary | ICD-10-CM | POA: Diagnosis not present

## 2024-06-07 DIAGNOSIS — F03A0 Unspecified dementia, mild, without behavioral disturbance, psychotic disturbance, mood disturbance, and anxiety: Secondary | ICD-10-CM | POA: Diagnosis present

## 2024-06-07 DIAGNOSIS — R5381 Other malaise: Secondary | ICD-10-CM | POA: Diagnosis present

## 2024-06-07 DIAGNOSIS — R413 Other amnesia: Secondary | ICD-10-CM | POA: Diagnosis not present

## 2024-06-07 DIAGNOSIS — I10 Essential (primary) hypertension: Secondary | ICD-10-CM | POA: Diagnosis present

## 2024-06-07 DIAGNOSIS — E1122 Type 2 diabetes mellitus with diabetic chronic kidney disease: Secondary | ICD-10-CM | POA: Diagnosis present

## 2024-06-07 DIAGNOSIS — Z794 Long term (current) use of insulin: Secondary | ICD-10-CM | POA: Diagnosis not present

## 2024-06-07 DIAGNOSIS — B952 Enterococcus as the cause of diseases classified elsewhere: Secondary | ICD-10-CM | POA: Diagnosis not present

## 2024-06-07 DIAGNOSIS — D638 Anemia in other chronic diseases classified elsewhere: Secondary | ICD-10-CM | POA: Diagnosis not present

## 2024-06-07 DIAGNOSIS — Z888 Allergy status to other drugs, medicaments and biological substances status: Secondary | ICD-10-CM | POA: Diagnosis not present

## 2024-06-07 DIAGNOSIS — F0390 Unspecified dementia without behavioral disturbance: Secondary | ICD-10-CM | POA: Insufficient documentation

## 2024-06-07 DIAGNOSIS — G8929 Other chronic pain: Secondary | ICD-10-CM | POA: Diagnosis present

## 2024-06-07 DIAGNOSIS — M25551 Pain in right hip: Secondary | ICD-10-CM | POA: Diagnosis not present

## 2024-06-07 DIAGNOSIS — D631 Anemia in chronic kidney disease: Secondary | ICD-10-CM | POA: Diagnosis present

## 2024-06-07 DIAGNOSIS — N1832 Chronic kidney disease, stage 3b: Secondary | ICD-10-CM | POA: Diagnosis not present

## 2024-06-07 DIAGNOSIS — M545 Low back pain, unspecified: Secondary | ICD-10-CM | POA: Diagnosis not present

## 2024-06-07 DIAGNOSIS — Z9181 History of falling: Secondary | ICD-10-CM | POA: Diagnosis not present

## 2024-06-07 DIAGNOSIS — R739 Hyperglycemia, unspecified: Secondary | ICD-10-CM | POA: Diagnosis not present

## 2024-06-07 DIAGNOSIS — R269 Unspecified abnormalities of gait and mobility: Secondary | ICD-10-CM | POA: Diagnosis not present

## 2024-06-07 LAB — GLUCOSE, CAPILLARY
Glucose-Capillary: 130 mg/dL — ABNORMAL HIGH (ref 70–99)
Glucose-Capillary: 142 mg/dL — ABNORMAL HIGH (ref 70–99)
Glucose-Capillary: 143 mg/dL — ABNORMAL HIGH (ref 70–99)

## 2024-06-07 MED ORDER — MONTELUKAST SODIUM 10 MG PO TABS
10.0000 mg | ORAL_TABLET | Freq: Every day | ORAL | Status: DC
  Administered 2024-06-08 – 2024-06-28 (×24): 10 mg via ORAL
  Filled 2024-06-07 (×22): qty 1

## 2024-06-07 MED ORDER — FLEET ENEMA RE ENEM: 1.0000 | ENEMA | Freq: Once | RECTAL | Status: DC | PRN

## 2024-06-07 MED ORDER — DONEPEZIL HCL 10 MG PO TABS: 5.0000 mg | ORAL_TABLET | Freq: Every day | ORAL | Status: DC

## 2024-06-07 MED ORDER — MAGNESIUM OXIDE -MG SUPPLEMENT 400 (240 MG) MG PO TABS
400.0000 mg | ORAL_TABLET | Freq: Every day | ORAL | Status: DC
  Administered 2024-06-08 – 2024-06-29 (×25): 400 mg via ORAL
  Filled 2024-06-07 (×22): qty 1

## 2024-06-07 MED ORDER — FLUTICASONE PROPIONATE 50 MCG/ACT NA SUSP
1.0000 | Freq: Every day | NASAL | Status: DC
  Administered 2024-06-08 – 2024-06-29 (×20): 1 via NASAL
  Filled 2024-06-07: qty 16

## 2024-06-07 MED ORDER — GALANTAMINE HYDROBROMIDE ER 8 MG PO CP24
8.0000 mg | ORAL_CAPSULE | ORAL | Status: DC
  Administered 2024-06-08 – 2024-06-29 (×25): 8 mg via ORAL
  Filled 2024-06-07 (×24): qty 1

## 2024-06-07 MED ORDER — PROCHLORPERAZINE 25 MG RE SUPP: 12.5000 mg | Freq: Four times a day (QID) | RECTAL | Status: DC | PRN

## 2024-06-07 MED ORDER — VITAMIN B-12 1000 MCG PO TABS
1000.0000 ug | ORAL_TABLET | Freq: Every day | ORAL | Status: DC
  Administered 2024-06-08 – 2024-06-29 (×25): 1000 ug via ORAL
  Filled 2024-06-07 (×22): qty 1

## 2024-06-07 MED ORDER — ENOXAPARIN SODIUM 40 MG/0.4ML IJ SOSY
40.0000 mg | PREFILLED_SYRINGE | INTRAMUSCULAR | Status: DC
  Administered 2024-06-07: 40 mg via SUBCUTANEOUS
  Filled 2024-06-07: qty 0.4

## 2024-06-07 MED ORDER — GUAIFENESIN-DM 100-10 MG/5ML PO SYRP: 5.0000 mL | ORAL_SOLUTION | Freq: Four times a day (QID) | ORAL | Status: DC | PRN

## 2024-06-07 MED ORDER — VITAMIN C 500 MG PO TABS
500.0000 mg | ORAL_TABLET | Freq: Every day | ORAL | Status: DC
  Administered 2024-06-08 – 2024-06-29 (×25): 500 mg via ORAL
  Filled 2024-06-07 (×22): qty 1

## 2024-06-07 MED ORDER — LIDOCAINE HCL URETHRAL/MUCOSAL 2 % EX GEL: CUTANEOUS | Status: DC | PRN

## 2024-06-07 MED ORDER — INSULIN ASPART 100 UNIT/ML IJ SOLN
0.0000 [IU] | Freq: Three times a day (TID) | INTRAMUSCULAR | Status: DC
  Administered 2024-06-08 – 2024-06-22 (×29): 1 [IU] via SUBCUTANEOUS

## 2024-06-07 MED ORDER — LINACLOTIDE 145 MCG PO CAPS: 145.0000 ug | ORAL_CAPSULE | Freq: Every day | ORAL | Status: DC | PRN

## 2024-06-07 MED ORDER — DIPHENHYDRAMINE HCL 25 MG PO CAPS: 25.0000 mg | ORAL_CAPSULE | Freq: Four times a day (QID) | ORAL | Status: DC | PRN

## 2024-06-07 MED ORDER — ALUM & MAG HYDROXIDE-SIMETH 200-200-20 MG/5ML PO SUSP: 30.0000 mL | ORAL | Status: DC | PRN

## 2024-06-07 MED ORDER — PROCHLORPERAZINE EDISYLATE 10 MG/2ML IJ SOLN: 5.0000 mg | Freq: Four times a day (QID) | INTRAMUSCULAR | Status: DC | PRN

## 2024-06-07 MED ORDER — ASPIRIN 81 MG PO TBEC
81.0000 mg | DELAYED_RELEASE_TABLET | Freq: Every day | ORAL | Status: DC
  Administered 2024-06-08 – 2024-06-29 (×25): 81 mg via ORAL
  Filled 2024-06-07 (×22): qty 1

## 2024-06-07 MED ORDER — AMLODIPINE BESYLATE 10 MG PO TABS
10.0000 mg | ORAL_TABLET | Freq: Every day | ORAL | Status: DC
  Administered 2024-06-08: 10 mg via ORAL
  Filled 2024-06-07 (×2): qty 1

## 2024-06-07 MED ORDER — TAMSULOSIN HCL 0.4 MG PO CAPS
0.4000 mg | ORAL_CAPSULE | Freq: Every day | ORAL | Status: DC
  Administered 2024-06-07 – 2024-06-18 (×15): 0.4 mg via ORAL
  Filled 2024-06-07 (×12): qty 1

## 2024-06-07 MED ORDER — ZINC SULFATE 220 (50 ZN) MG PO CAPS
220.0000 mg | ORAL_CAPSULE | Freq: Every day | ORAL | Status: DC
  Administered 2024-06-09 – 2024-06-28 (×21): 220 mg via ORAL
  Filled 2024-06-07 (×20): qty 1

## 2024-06-07 MED ORDER — ROSUVASTATIN CALCIUM 5 MG PO TABS
5.0000 mg | ORAL_TABLET | Freq: Every day | ORAL | Status: DC
  Administered 2024-06-09 – 2024-06-28 (×23): 5 mg via ORAL
  Filled 2024-06-07 (×21): qty 1

## 2024-06-07 MED ORDER — ADULT MULTIVITAMIN W/MINERALS CH
1.0000 | ORAL_TABLET | Freq: Every day | ORAL | Status: DC
  Administered 2024-06-08 – 2024-06-29 (×25): 1 via ORAL
  Filled 2024-06-07 (×22): qty 1

## 2024-06-07 MED ORDER — HYDRALAZINE HCL 25 MG PO TABS
25.0000 mg | ORAL_TABLET | Freq: Two times a day (BID) | ORAL | Status: DC
  Administered 2024-06-07 – 2024-06-08 (×3): 25 mg via ORAL
  Filled 2024-06-07 (×2): qty 1

## 2024-06-07 MED ORDER — PROCHLORPERAZINE MALEATE 5 MG PO TABS: 5.0000 mg | ORAL_TABLET | Freq: Four times a day (QID) | ORAL | Status: DC | PRN

## 2024-06-07 MED ORDER — MELATONIN 3 MG PO TABS
3.0000 mg | ORAL_TABLET | Freq: Every evening | ORAL | Status: DC | PRN
  Administered 2024-06-09 – 2024-06-11 (×3): 3 mg via ORAL
  Filled 2024-06-07 (×3): qty 1

## 2024-06-07 MED ORDER — ACETAMINOPHEN 325 MG PO TABS
325.0000 mg | ORAL_TABLET | ORAL | Status: DC | PRN
  Administered 2024-06-12 – 2024-06-17 (×4): 650 mg via ORAL
  Filled 2024-06-07 (×3): qty 2

## 2024-06-07 MED ORDER — BISACODYL 10 MG RE SUPP: 10.0000 mg | Freq: Every day | RECTAL | Status: DC | PRN

## 2024-06-07 MED ORDER — LACTULOSE 10 GM/15ML PO SOLN
30.0000 g | Freq: Every day | ORAL | Status: DC
  Administered 2024-06-08: 30 g via ORAL
  Filled 2024-06-07: qty 45

## 2024-06-07 MED ORDER — INSULIN ASPART 100 UNIT/ML IJ SOLN: 0.0000 [IU] | Freq: Every day | INTRAMUSCULAR | Status: DC

## 2024-06-07 MED ORDER — DOCUSATE SODIUM 100 MG PO CAPS
100.0000 mg | ORAL_CAPSULE | Freq: Two times a day (BID) | ORAL | Status: DC
  Administered 2024-06-07 – 2024-06-28 (×30): 100 mg via ORAL
  Filled 2024-06-07 (×40): qty 1

## 2024-06-07 NOTE — Discharge Summary (Signed)
 Primary Care Physician Wanda KANDICE Shave, MD Date of Admission 05/28/2024 Date of Discharge 06/07/24  Discharge Diagnosis Principal Problem:   Weakness Active Problems:   Chronic kidney disease, stage 3b (HCC)   Type 2 diabetes mellitus, without long-term current use of insulin  (HCC)   Hypertension   Dyslipidemia   Urinary retention Resolved Problems:   Urinary tract infection   Non-ischemic myocardial injury (non-traumatic)   Hospital Course Patient is a 89 year old female with past medical history of type 2 diabetes, CKD stage III, hypertension, hyperlipidemia, osteoporosis who presented to [place] on 05/28/2024 for evaluation of lethargy.  She had been taking Keflex  for a UTI and was on day 9/10.  She had presented to urgent care on the day prior to presentation was given a dose of Rocephin  and started on a course of Cipro .  Her symptoms persisted prompting her to come to the ED.  Urinalysis was suggestive of UTI and patient was admitted for further evaluation.  Patient was initially treated with Rocephin , however urine culture did not grow anything and antibiotics were discontinued.  MRI brain and MRI of the lumbar spine were performed for further evaluation to identify etiology of patient's weakness, however both were without acute findings.  It was determined the patient would benefit from placement at skilled nursing facility for subacute rehab.  PT ADMITTED FOR: Generalized Weakness MRI brain and MRI lumbar spine and MRI c-spine were negative for acute process to explain patient's weakness.  Continue PT/OT Urinary retention Myrbetriq  discontinued Continue foley Continue flomax  Pt to follow up with urology on outpt basis Urinary tract infection S/p therapy, Ucx negative Constipation, resolved - bowel regimen continued Chronic kidney disease, stage 3b (HCC) Creatinine at approximate baseline Type 2 diabetes mellitus, without long-term current use of insulin   (HCC) Blood sugar within acceptable range.  Continue sliding scale insulin  and monitor for hypo-/hyperglycemia with ACHS point-of-care blood glucose with use of insulin . Hypertension, improving Medications adjusted Dyslipidemia Continue home rosuvastatin  Non-ischemic myocardial injury (non-traumatic), resolved Denies chest pain   Pt to follow up with urology and PCP on outpt basis. Pt discharging to rehab facility. Pt received maximum benefit to inpatient stay. Pt seen and examined on day of discharge and is determined suitable for discharge.  Pertinent Physical Exam At Time of Discharge BP 136/74 (BP Location: Left arm, Patient Position: Lying)   Pulse 67   Temp 36.8 C (98.2 F) (Temporal)   Resp 18   Ht 1.702 m (5' 7)   Wt 215 lb 13.3 oz (97.9 kg)   SpO2 95%   BMI 33.80 kg/m  Gen:  alert, NAD HEENT:  [place]/AT, PERRL, no scleral icterus, No obvious hearing loss, no nasal bleeding, moist mucous membranes CV:  RRR, no edema Lungs:  CTA B/L Abd: +BS, soft, NT, ND Ext: no rash, no erythema  Neuro:  alert, generalized weakness without focal deficits  Psych: pleasant, calm  Home Medications After Discharge  Scheduled . amLODIPine  (Norvasc ) 10 MG tablet, Take 1 tablet by mouth at bedtime. SABRA ascorbic acid (Vitamin C ) 500 MG tablet, Take 500 mg by mouth in the morning. . aspirin  81 MG EC tablet, Take 81 mg by mouth in the morning. SABRA BLACK CURRANT SEED OIL PO, Take 1 tablet by mouth in the morning. . cyanocobalamin  (Vitamin B-12) 1000 MCG tablet, Take 1,000 mcg by mouth in the morning. . denosumab  (Prolia ) injection, Inject 60 mg under the skin every 6 months. . docusate sodium  100 MG capsule, Take 1 capsule by mouth  2 times a day. . ferrous sulfate  ER (Slow Release Iron) 50 MG ER tablet, Take 1 tablet by mouth 2 times a week. Takes on Sundays and Thursdays . fluticasone  (Flonase ) 50 MCG/ACT nasal spray, Administer 2 sprays into each nostril in the morning. Shake gently. Before  first use, prime pump. After use, clean tip and replace cap. . galantamine  ER (Razadyne  ER) 8 MG 24 hr capsule, Take 8 mg by mouth in the morning. Take with meals. . hydrALAZINE  (Apresoline ) 25 MG tablet, Take 1 tablet by mouth 3 times a day. . linaCLOtide  (Linzess ) 145 MCG capsule, Take 145 mcg by mouth 2 times a week. Takes on Saturdays and Wednesdays . magnesium  oxide (Mag-Ox) 200 mg half tablet, Take 400 mg by mouth in the morning. . montelukast  (Singulair ) 10 MG tablet, Take 10 mg by mouth in the morning. . Multiple Vitamin (multivitamin) tablet, Take 1 tablet by mouth in the morning. . nebivolol  (Bystolic ) 5 MG tablet, Take 5 mg by mouth at bedtime. . rosuvastatin  (Crestor ) 5 MG tablet, Take 5 mg by mouth in the evening. . tamsulosin  (Flomax ) 0.4 MG 24 hr capsule, Take 1 capsule by mouth in the morning. . zinc  sulfate (Zincate) 220 (50 Zn) MG capsule, Take 50 mg of elemental zinc  by mouth in the morning.  PRN . acetaminophen  (Tylenol ) 325 MG tablet, Take 650 mg by mouth every 4 hours as needed (PRN pain). . Lidocaine -Hydrocortisone  Ace 3-0.5 % cream, Apply 1 Application topically 2 times a day as needed (PRN rectal discomfort).  TIME NEEDED FOR DISCHARGE:  48 MIN  Paulina JONETTA Rainwater, MD

## 2024-06-07 NOTE — Progress Notes (Addendum)
 Butler Reche BRAVO, PT Rehab Admission Coordinator Physical Medicine and Rehabilitation   PMR Pre-admission Cosign Needed   Encounter Date: 06/05/2024  Related encounter: Documentation from 06/05/2024 in [facility] MEDICINE AND REHABILITATION   Cosign Needed     Expand All Collapse All  PMR Admission Coordinator Pre-Admission Assessment   Patient: [name] Mathews is an 89 y.o., female MRN: [id number] DOB: Jun 03, 1934 Height:   Weight:     Insurance Information HMO:     PPO:      PCP:      IPA:      80/20:      OTHER:  PRIMARY: Medicare Part A and B      Policy#: [id number]       Subscriber: pt CM Name:       Phone#:      Fax#:  Pre-Cert#: verified Health and safety inspector:  Benefits:  Phone #:      Name:  Eff. Date: A and B 10/31/2000     Deduct: $1676      Out of Pocket Max: n/a      Life Max: n/a CIR: 100%      SNF: 20 full days Outpatient: 80%     Co-Pay: 20% Home Health: 100%      Co-Pay:  DME: 80%     Co-Pay: 20% Providers:  SECONDARY: [employer]      Policy#: [id number]      Phone#:    Financial Counselor:       Phone#:    The "Data Collection Information Summary" for patients in Inpatient Rehabilitation Facilities with attached "Privacy Act Statement-Health Care Records" was provided and verbally reviewed with: Patient and Family   Emergency Contact Information Contact Information       Name Relation Home Work Mobile    Crawford,[name] Daughter [phone]             Other Contacts       Name Relation Home Work Mobile    [place] Daughter [phone]               Current Medical History  Patient Admitting Diagnosis: debility    History of Present Illness: Pt is an 89 y/o female with PMH of DM, CKD, HTN, normocytic anemia, and lumbar radiculopathy s/p PLIF on 06/05/2019 per Dr. Joshua and did receive CIR services at the time, and frequent UTIs, who was admitted to [facility] on 05/28/24 with c/o ongoing UTI symptoms.  Per outside records, pt had been  diagnosed with UTI by per PCP and prescribed 10 day course of abx.  On day 9 her daughter brought her to urgent care as pt appeared to be getting weaker and more confused.  The next day she was admitted to the hospital.  UTI workup was negative and antibiotics stopped.  She's had a CT of her bladder to evaluate for blockage which may be causing her symptoms of  UTI.  MRI cervical spine also completed to r/o cervical myelopathy and it was negative for acute abnormality per report.  Last BM prior to admission.  Daughter felt needed further workup which was not provided for pt at current facility.  Therapy evaluations were completed and pt was recommended for CIR due to significant/rapid functional decline.    Patient's medical record from [facility] has been reviewed by the rehabilitation admission coordinator and physician.   Past Medical History      Past Medical History:  Diagnosis Date   Anemia  hx of   Arthritis     Breast lump      hx of   Cataract      left eye   Chronic back pain     Diabetes mellitus     Hyperlipidemia     Hypertension      sees Dr. 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 No   Family History   family history includes Breast cancer (age of onset: 19) in her sister.   Current Medications  Current Medications    Current Outpatient Medications:    acetaminophen  (TYLENOL ) 325 MG tablet, Take 2 tablets (650 mg total) by mouth every 4 (four) hours as needed for mild pain ((score 1 to 3) or temp > 100.5)., Disp:  , Rfl:    amLODipine  (NORVASC ) 5 MG tablet, Take 1 tablet (5 mg total) by mouth every evening., Disp: 90 tablet, Rfl: 3   aspirin   81 MG tablet, Take 81 mg by mouth daily., Disp: , Rfl:    Bacillus Coagulans-Inulin (ALIGN PREBIOTIC-PROBIOTIC PO), Take by mouth., Disp: , Rfl:    cefdinir  (OMNICEF ) 300 MG capsule, Take 1 capsule (300 mg total) by mouth 2 (two) times daily., Disp: 14 capsule, Rfl: 0   Cholecalciferol (VITAMIN D3) 125 MCG (5000 UT) TABS, Take 1 tablet (5,000 Units total) by mouth daily., Disp: 30 tablet, Rfl: 0   COVID-19 mRNA Vac-TriS, Pfizer, (COMIRNATY ) SUSP injection, Inject into the muscle., Disp: 0.3 mL, Rfl: 0   cyclobenzaprine  (FLEXERIL ) 10 MG tablet, Take 1 tablet (10 mg total) by mouth 3 (three) times daily as needed for muscle spasms., Disp: 60 tablet, Rfl: 0   docusate sodium  (COLACE) 250 MG capsule, Take 250 mg by mouth daily., Disp: , Rfl:    Ferrous Sulfate  (SLOW RELEASE IRON  PO), Take 1 tablet by mouth daily., Disp: , Rfl:    fluconazole  (DIFLUCAN ) 200 MG tablet, Take 1 tablet by mouth as needed for yeast infection, no more than 1 dose in 3 days., Disp: 5 tablet, Rfl: 1   fluticasone  (FLONASE ) 50 MCG/ACT nasal spray, Place 1 spray into both nostrils daily as needed for allergies or rhinitis., Disp: , Rfl:    fluticasone  (FLONASE ) 50 MCG/ACT nasal spray, Spray 2 sprays into each nostril every day once daily., Disp: 16 g, Rfl: 3   fluticasone  (FLONASE ) 50 MCG/ACT nasal spray, Place 2 sprays into both nostrils daily., Disp: 48 g, Rfl: 3   galantamine  (RAZADYNE  ER) 8 MG 24 hr capsule, Take 1 capsule (8 mg total) by mouth every morning with food., Disp: 90 capsule, Rfl: 3   galantamine  (RAZADYNE  ER) 8 MG 24 hr capsule, Take 1 capsule (8 mg total) by mouth every morning with food., Disp: 90 capsule, Rfl: 3   glucose blood (ONETOUCH VERIO) test strip, Use 1 test strip to check blood glucose once a day., Disp: 100 each, Rfl: 3   hydrALAZINE  (APRESOLINE ) 25 MG tablet, Take 1 tablet (25 mg total) by mouth 2 (two) times daily., Disp: 180 tablet, Rfl: 3   hydrocortisone  (ANUCORT-HC ) 25 MG suppository, Place 1  suppository (25 mg total) rectally 3 (three) times  daily., Disp: 30 suppository, Rfl: 0   Lactobacillus-Inulin (PROBIOTIC DIGESTIVE SUPPORT PO), Take 2 each by mouth every morning., Disp: , Rfl:    Lancets (ONETOUCH DELICA PLUS LANCET33G) MISC, Apply 1 each topically daily., Disp: , Rfl:    Lidocaine -Hydrocort , Perianal, 3-0.5 % CREA, Apply to rectum 2 (two) times daily as needed externally., Disp: 28.35 g, Rfl: 1   linaclotide  (LINZESS ) 145 MCG CAPS capsule, Take 1 capsule (145 mcg total) by mouth daily without food., Disp: 90 capsule, Rfl: 4   mirabegron  ER (MYRBETRIQ ) 50 MG TB24 tablet, Take 1 tablet (50 mg total) by mouth daily., Disp: 90 tablet, Rfl: 3   montelukast  (SINGULAIR ) 10 MG tablet, Take 1 tablet (10 mg total) by mouth daily., Disp: 90 tablet, Rfl: 4   montelukast  (SINGULAIR ) 10 MG tablet, Take 1 tablet (10 mg total) by mouth daily., Disp: 90 tablet, Rfl: 4   Multiple Vitamins-Minerals (MULTIVITAMIN PO), Take 2 each by mouth daily. , Disp: , Rfl:    nebivolol  (BYSTOLIC ) 5 MG tablet, Take 1 tablet (5 mg total) by mouth at bedtime., Disp: 30 tablet, Rfl: 11   ONETOUCH VERIO test strip, USE 1 TEST STRIP TO CHECK BLOOD GLUCOSE ONCE A DAY DX  E11.9, Disp: , Rfl:    pioglitazone  (ACTOS ) 30 MG tablet, Take 1 tablet (30 mg total) by mouth daily., Disp: 30 tablet, Rfl: 0   rosuvastatin  (CRESTOR ) 5 MG tablet, Take 1 tablet (5 mg total) by mouth daily., Disp: 30 tablet, Rfl: 0   rosuvastatin  (CRESTOR ) 5 MG tablet, Take 1 tablet (5 mg total) by mouth daily., Disp: 90 tablet, Rfl: 4   rosuvastatin  (CRESTOR ) 5 MG tablet, Take 1 tablet (5 mg total) by mouth daily., Disp: 90 tablet, Rfl: 3   RSV vaccine recomb adjuvanted (AREXVY ) 120 MCG/0.5ML injection, Inject into the muscle., Disp: 0.5 mL, Rfl: 0   vitamin B-12 (CYANOCOBALAMIN ) 1000 MCG tablet, Take 1,000 mcg by mouth daily. OTC, Disp: , Rfl:    vitamin C  (ASCORBIC ACID ) 500 MG tablet, Take 500 mg by mouth daily., Disp: , Rfl:      Patients  Current Diet: Diet reg/thin   Precautions / Restrictions      Has the patient had 2 or more falls or a fall with injury in the past year? No   Prior Activity Level Household: distant supervision for ambulation at home with RW, did have an aid 9-4 to help with bathing/dressing, lives with daughter, Angeline     Prior Functional Level Self Care: Did the patient need help bathing, dressing, using the toilet or eating? Needed some help   Indoor Mobility: Did the patient need assistance with walking from room to room (with or without device)? Independent   Stairs: Did the patient need assistance with internal or external stairs (with or without device)? Needed some help   Functional Cognition: Did the patient need help planning regular tasks such as shopping or remembering to take medications? Needed some help   Patient Information Are you of Hispanic, Latino/a,or Spanish origin?: A. No, not of Hispanic, Latino/a, or Spanish origin What is your race?: B. Black or African American Do you need or want an interpreter to communicate with a doctor or health care staff?: 0. No   Patient's Response To:  Health Literacy and Transportation Is the patient able to respond to health literacy and transportation needs?: No (outside market, completed over phone and pt unable to speak on phone) In the past 12 months, has lack of transportation kept  you from medical appointments or from getting medications?: No In the past 12 months, has lack of transportation kept you from meetings, work, or from getting things needed for daily living?: No Health Literacy and Transportation obtained via proxy: per daughter   Journalist, newspaper / Biochemist, clinical (specify type)     Prior Device Use: Indicate devices/aids used by the patient prior to current illness, exacerbation or injury? Walker     Prior Functional Level Current Functional Level  Bed Mobility   Mod Independent Max assist    Transfers   Mod  Independent   Max assist    Mobility - Walk/Wheelchair   Mod Independent   -- (unable)    Upper Body Dressing   Min assist   Max assist    Lower Body Dressing   Min assist   Max assist    Grooming   Min assist   Max assist    Eating/Drinking   Mod Independent   Other (not documented)    Toilet Transfer   Mod Independent   Max assist    Bladder Continence    continent      Bowel Management   continent      Stair Climbing    supervision -- (unable)    Communication   indep      Memory   supervision      Special considerations/life events Diabetic management yes   Previous Home Environment (from acute therapy documentation) Living Arrangements: Children  Lives With: Daughter Available Help at Discharge: Available 24 hours/day; Personal care attendant; Family Type of Home: House Home Layout: Able to live on main level with bedroom/bathroom Home Access: Stairs to enter Entrance Stairs-Rails: Left Entrance Stairs-Number of Steps: 4 Bathroom Shower/Tub: Health visitor: Handicapped height Bathroom Accessibility: Yes How Accessible: Accessible via walker Home Care Services: Yes Type of Home Care Services: Other (Comment) (aid)     Discharge Living Setting Plans for Discharge Living Setting: Lives with (comment) (daughter Angeline) Type of Home at Discharge: House Discharge Home Layout: Able to live on main level with bedroom/bathroom Discharge Home Access: Stairs to enter Entrance Stairs-Rails: Left Entrance Stairs-Number of Steps: 4 Discharge Bathroom Shower/Tub: Walk-in shower Discharge Bathroom Toilet: Handicapped height Discharge Bathroom Accessibility: Yes How Accessible: Accessible via walker Does the patient have any problems obtaining your medications?: No     Social/Family/Support Systems Anticipated Caregiver: Daughter Angeline Anticipated Industrial/product designer Information: [phone] Ability/Limitations of Caregiver: none  stated Caregiver Availability: 24/7 Discharge Plan Discussed with Primary Caregiver: Yes Is Caregiver In Agreement with Plan?: Yes Does Caregiver/Family have Issues with Lodging/Transportation while Pt is in Rehab?: No     Goals Patient/Family Goal for Rehab: PT/OT min assist, SLP N/a Expected length of stay: 21-24 days Additional Information: Discharge plan: return home with daughter Angeline as per baseline Pt/Family Agrees to Admission and willing to participate: Yes Program Orientation Provided & Reviewed with Pt/Caregiver Including Roles  & Responsibilities: Yes Additional Information Needs: previous stay on CIR x2 (2013 and 2020)     Decrease burden of Care through IP rehab admission: n/a   Possible need for SNF placement upon discharge:  Not anticipated.  Pt with excellent family support and plan to discharge back to previous living situation with daughter and hired caregivers providing 24/7.    Patient Condition: I have reviewed medical records from [facility], spoken with CM, and daughter. I discussed via phone for inpatient rehabilitation assessment.  Patient will benefit from ongoing PT and OT, can  actively participate in 3 hours of therapy a day 5 days of the week, and can make measurable gains during the admission.  Patient will also benefit from the coordinated team approach during an Inpatient Acute Rehabilitation admission.  The patient will receive intensive therapy as well as Rehabilitation physician, nursing, social worker, and care management interventions.  Due to bladder management, bowel management, safety, skin/wound care, disease management, medication administration, pain management, and patient education the patient requires 24 hour a day rehabilitation nursing.  The patient is currently max assist with mobility and basic ADLs.  Discharge setting and therapy post discharge at home is anticipated.  Patient has agreed to participate in the Acute Inpatient Rehabilitation  Program and will admit today.   Preadmission Screen Completed By:  Jarissa Sheriff E Maricus Tanzi, PT, DPT 06/05/2024 4:43 PM ______________________________________________________________________   Discussed status with Dr. Emeline on 06/06/24  at 2:58 PM  and received approval for admission today.   Admission Coordinator:  Ridhima Golberg E Junice Fei, PT, DPT time 2:58 PM Pattricia  06/06/24     Assessment/Plan: Diagnosis: Debility secondary to generalized lethargy/weakness Does the need for close, 24 hr/day Medical supervision in concert with the patient's rehab needs make it unreasonable for this patient to be served in a less intensive setting? Yes Co-Morbidities requiring supervision/potential complications: Recurrent UTIs, urinary retention, constipation, chronic kidney disease stage IIIb, type 2 diabetes, hypertension Due to bladder management, bowel management, safety, skin/wound care, disease management, medication administration, pain management, and patient education, does the patient require 24 hr/day rehab nursing? Yes Does the patient require coordinated care of a physician, rehab nurse, PT, OT  to address physical and functional deficits in the context of the above medical diagnosis(es)? Yes Addressing deficits in the following areas: balance, endurance, locomotion, strength, transferring, bowel/bladder control, bathing, dressing, feeding, grooming, and toileting Can the patient actively participate in an intensive therapy program of at least 3 hrs of therapy 5 days a week? Yes The potential for patient to make measurable gains while on inpatient rehab is good Anticipated functional outcomes upon discharge from inpatient rehab: min assist PT, min assist OT Estimated rehab length of stay to reach the above functional goals is: 21-24 days Anticipated discharge destination: Home 10. Overall Rehab/Functional Prognosis: good   MD Signature   Joesph JAYSON Emeline, DO 06/07/2024          Revision History

## 2024-06-07 NOTE — H&P (Signed)
 Physical Medicine and Rehabilitation Admission H&P     CC: Functional deficits due to debility.     HPI: [name] Mathews is an 89 year old female T2DM, chronic back pain s/p decompression X 2 (CIR 2013/2020), HTN, CKD 4,  OAB, mild dementia, worsening of anemia w/fatigue, UTI treated with antibiotics without improvement and decline in mobility  with inability to move out of bed. She was admitted ot [facility] for work up and started on Rocephin  briefly due to concerns of UTI. MRI brain and    MRI lumbar spine showed posterior fusion L2-L5 with multilevel spondylosis and mild central canal narrowing L1-L2 and L5-S1 but limited due to motion/hardware.  PT/OT consulted due to recent decline with debility. Foley placed due to urinary retention,  Myrbetriq   discontinued and laxative added to manage constipation. . .  Therapy has been working with patient who is limited by weakness and fatigue and requires increased time with rest break for grooming tasks as well as min to mod assist for bed mobility and is able to take a couple of steps with cues/truncal support. Prior to recent illness, patient had a sitter for 8 hours during the day but was able to ambulate with supervision?. She takes turns living 6 months at a time . Therapy team recommended CIR and family requested transfer back to [facility] for rehab     ROS         Past Medical History:  Diagnosis Date   Anemia      hx of   Arthritis     Breast lump      hx of   Cataract      left eye   Chronic back pain     Diabetes mellitus     Hyperlipidemia     Hypertension      sees Dr. Wanda Shave, [facility] internal medicine   Normochromic normocytic anemia 05/22/2017    Hb 10 MCV 85.6 No monoclonal spike on SPEP  Creatinine 1.3-1.6   Peripheral vascular disease (HCC)     Vitreomacular adhesion of right eye 02/03/2020    Vitrectomy, PRP to release VMT, 11-11-2020               Past Surgical History:  Procedure Laterality Date    APPENDECTOMY       BACK SURGERY       BREAST CYST EXCISION Left     BREAST SURGERY        cyst removed left side   BUNIONECTOMY       CARDIOVASCULAR STRESS TEST       COLONOSCOPY       DILATION AND CURETTAGE OF UTERUS       TONSILLECTOMY                   Family History  Problem Relation Age of Onset   Breast cancer Sister 31          Social History:  reports that she has never smoked. She has never used smokeless tobacco. She reports that she does not drink alcohol  and does not use drugs.       Allergies       Allergies  Allergen Reactions   Januvia [Sitagliptin] Other (See Comments)      Numbness in mouth   Simvastatin Other (See Comments)      tremors            Medications Prior to Admission  Medication Sig Dispense Refill  acetaminophen  (TYLENOL ) 325 MG tablet Take 2 tablets (650 mg total) by mouth every 4 (four) hours as needed for mild pain ((score 1 to 3) or temp > 100.5).       amLODipine  (NORVASC ) 5 MG tablet Take 1 tablet (5 mg total) by mouth every evening. 90 tablet 3   aspirin  81 MG tablet Take 81 mg by mouth daily.       Bacillus Coagulans-Inulin (ALIGN PREBIOTIC-PROBIOTIC PO) Take by mouth.       cefdinir  (OMNICEF ) 300 MG capsule Take 1 capsule (300 mg total) by mouth 2 (two) times daily. 14 capsule 0   Cholecalciferol (VITAMIN D3) 125 MCG (5000 UT) TABS Take 1 tablet (5,000 Units total) by mouth daily. 30 tablet 0   COVID-19 mRNA Vac-TriS, Pfizer, (COMIRNATY ) SUSP injection Inject into the muscle. 0.3 mL 0   cyclobenzaprine  (FLEXERIL ) 10 MG tablet Take 1 tablet (10 mg total) by mouth 3 (three) times daily as needed for muscle spasms. 60 tablet 0   docusate sodium  (COLACE) 250 MG capsule Take 250 mg by mouth daily.       Ferrous Sulfate  (SLOW RELEASE IRON PO) Take 1 tablet by mouth daily.       fluconazole  (DIFLUCAN ) 200 MG tablet Take 1 tablet by mouth as needed for yeast infection, no more than 1 dose in 3 days. 5 tablet 1   fluticasone  (FLONASE )  50 MCG/ACT nasal spray Place 1 spray into both nostrils daily as needed for allergies or rhinitis.       fluticasone  (FLONASE ) 50 MCG/ACT nasal spray Spray 2 sprays into each nostril every day once daily. 16 g 3   fluticasone  (FLONASE ) 50 MCG/ACT nasal spray Place 2 sprays into both nostrils daily. 48 g 3   galantamine  (RAZADYNE  ER) 8 MG 24 hr capsule Take 1 capsule (8 mg total) by mouth every morning with food. 90 capsule 3   galantamine  (RAZADYNE  ER) 8 MG 24 hr capsule Take 1 capsule (8 mg total) by mouth every morning with food. 90 capsule 3   glucose blood (ONETOUCH VERIO) test strip Use 1 test strip to check blood glucose once a day. 100 each 3   hydrALAZINE  (APRESOLINE ) 25 MG tablet Take 1 tablet (25 mg total) by mouth 2 (two) times daily. 180 tablet 3   hydrocortisone  (ANUCORT-HC ) 25 MG suppository Place 1 suppository (25 mg total) rectally 3 (three) times daily. 30 suppository 0   Lactobacillus-Inulin (PROBIOTIC DIGESTIVE SUPPORT PO) Take 2 each by mouth every morning.       Lancets (ONETOUCH DELICA PLUS LANCET33G) MISC Apply 1 each topically daily.       Lidocaine -Hydrocort , Perianal, 3-0.5 % CREA Apply to rectum 2 (two) times daily as needed externally. 28.35 g 1   linaclotide  (LINZESS ) 145 MCG CAPS capsule Take 1 capsule (145 mcg total) by mouth daily without food. 90 capsule 4   mirabegron  ER (MYRBETRIQ ) 50 MG TB24 tablet Take 1 tablet (50 mg total) by mouth daily. 90 tablet 3   montelukast  (SINGULAIR ) 10 MG tablet Take 1 tablet (10 mg total) by mouth daily. 90 tablet 4   montelukast  (SINGULAIR ) 10 MG tablet Take 1 tablet (10 mg total) by mouth daily. 90 tablet 4   Multiple Vitamins-Minerals (MULTIVITAMIN PO) Take 2 each by mouth daily.        nebivolol  (BYSTOLIC ) 5 MG tablet Take 1 tablet (5 mg total) by mouth at bedtime. 30 tablet 11   ONETOUCH VERIO test strip USE 1 TEST STRIP TO CHECK  BLOOD GLUCOSE ONCE A DAY DX  E11.9       pioglitazone  (ACTOS ) 30 MG tablet Take 1 tablet (30 mg  total) by mouth daily. 30 tablet 0   rosuvastatin  (CRESTOR ) 5 MG tablet Take 1 tablet (5 mg total) by mouth daily. 30 tablet 0   rosuvastatin  (CRESTOR ) 5 MG tablet Take 1 tablet (5 mg total) by mouth daily. 90 tablet 4   rosuvastatin  (CRESTOR ) 5 MG tablet Take 1 tablet (5 mg total) by mouth daily. 90 tablet 3   RSV vaccine recomb adjuvanted (AREXVY ) 120 MCG/0.5ML injection Inject into the muscle. 0.5 mL 0   vitamin B-12 (CYANOCOBALAMIN ) 1000 MCG tablet Take 1,000 mcg by mouth daily. OTC       vitamin C  (ASCORBIC ACID) 500 MG tablet Take 500 mg by mouth daily.                  Home: Lives in a two-story home with her daughter and son-in-law, primarily on the first floor but reportedly goes up/down 1 flight of steps at least twice daily  Functional History: Modified independent with a walker for ambulation, independent of most ADLs with some assistance with IADLs.  Does get assistance with showering.  Functional Status:  Mobility: Min to mod assist for transfers Able to take 2 steps to transfer with mod assist.  Working on BLE exercises.   ADL: Dependent   Cognition: Delayed but intact   Physical Exam: There were no vitals taken for this visit. Physical Exam   Constitutional: No apparent distress. Appropriate appearance for age. +Obese HENT: No JVD. Neck Supple. Trachea midline. Atraumatic, normocephalic. Eyes: PERRLA. EOMI. Visual fields grossly intact.  Cardiovascular: RRR, no murmurs/rub/gallops. No Edema. Peripheral pulses 2+  Respiratory: CTAB. No rales, rhonchi, or wheezing. On RA.  Abdomen: + bowel sounds, normoactive. No distention or tenderness.  GU: Not examined.  Skin: C/D/I. No apparent lesions. + PIV intact  MSK:      No apparent deformity.       Neurologic exam:  Cognition: AAO to person, place, time and event.+ Mild delay in recall.  Language: Fluent, No substitutions or neoglisms. No dysarthria. Names 3/3 objects correctly.  Insight: Good  insight into  current condition.  Mood: Pleasant affect, appropriate mood.  Sensation: To light touch intact in BL UEs and LEs  Reflexes: 2+ in BL UE and LEs. Negative Hoffman's and babinski signs bilaterally.  CN: 2-12 grossly intact.  Coordination: Bilateral lower extremity tremors with exertion Spasticity: MAS 0 in all extremities.       Strength: Bilateral upper extremities 4/5 proximally, 5/5 distally      Bilateral lower extremities 3-/5 proximally, 4/5 distally      There were no vitals taken for this visit.   Medical Problem List and Plan: 1. Functional deficits secondary to debility s/p UTI             -patient may shower             -ELOS/Goals: 21-24 days, Min A PT/OT   - stable for IRF  2.  Antithrombotics: -DVT/anticoagulation:  Pharmaceutical: Lovenox              -antiplatelet therapy: N/A 3. Pain Management: Tylenol  prn.  4. Mood/Behavior/Sleep: LCSW to follow for evaluation and support.              -antipsychotic agents: N/A 5. Neuropsych/cognition: This patient is not fully capable of making decisions on her own behalf. 6. Skin/Wound Care: Routine pressure  relief meausre.  7. Fluids/Electrolytes/Nutrition: Monitor I/O. Check CMET in am.  8.HTN: Monitor BP TID- continue amlodipine  and hydralazine .  9. Anemia of chronic disease: Negative work up by Dr. Freddie and Dr Lanny last month.    10. T2DM: Diet controlled- Monitor BS ac/hs. 11. H/o UTIs/Urinary retention: Question due to Merbetriq--d/c a couple of days ago? Foley replaced. Will remove and start bladder program             --monitor voiding with PVR/bladder scan. 12. Dementia: Used to be on On Galantamine  and now on Donepezil .  13. Constipation: Had enema yesterday after no BM X one week 14.  CKD 4?3b?: Monitor with serial checks as well as UOP/voiding    Sharlet GORMAN Schmitz, PA-C 06/07/2024  I have examined the patient independently and edited the note for HPI, ROS, exam, assessment, and plan as appropriate. I am in  agreement with the above recommendations.   Joesph JAYSON Likes, DO 06/07/2024

## 2024-06-07 NOTE — Progress Notes (Signed)
 Inpatient Rehabilitation Admission Medication Review by a Pharmacist  A complete drug regimen review was completed for this patient to identify any potential clinically significant medication issues.  High Risk Drug Classes Is patient taking? Indication by Medication  Antipsychotic Yes, as an intravenous medication Compazine  - N/V  Anticoagulant Yes Lovenox  - DVT px  Antibiotic No   Opioid No   Antiplatelet No   Hypoglycemics/insulin  Yes SSI - DM  Vasoactive Medication Yes Amlodipine /hydralazine  - HTN Flomax  - incontinence  Chemotherapy No   Other Yes Vitamin C /zinc  - supplement Flonase /singulair  - allergy Galantamine  - dementia Lactulose /linzess  - constipation     Type of Medication Issue Identified Description of Issue Recommendation(s)  Drug Interaction(s) (clinically significant)     Duplicate Therapy     Allergy     No Medication Administration End Date     Incorrect Dose     Additional Drug Therapy Needed     Significant med changes from prior encounter (inform family/care partners about these prior to discharge).    Other       Clinically significant medication issues were identified that warrant physician communication and completion of prescribed/recommended actions by midnight of the next day:  No  Name of provider notified for urgent issues identified:   Provider Method of Notification:     Pharmacist comments:   Time spent performing this drug regimen review (minutes):  20   Sergio Batch, PharmD, [place], AAHIVP, CPP Infectious Disease Pharmacist 06/07/2024 7:13 PM

## 2024-06-07 NOTE — H&P (Shared)
 Physical Medicine and Rehabilitation Admission H&P    CC: Functional deficits due to debility.   HPI: [name] Mathews is an 89 year old female T2DM, chronic back pain s/p decompression X 2 (CIR 2013/2020), HTN, CKD 4,  OAB, mild dementia, worsening of anemia w/fatigue, UTI treated with antibiotics without improvement and decline in mobility  with inability to move out of bed. She was admitted ot [facility] for work up and started on Rocephin  briefly due to concerns of UTI. MRI brain and   MRI lumbar spine showed posterior fusion L2-L5 with multilevel spondylosis and mild central canal narrowing L1-L2 and L5-S1 but limited due to motion/hardware.  PT/OT consulted due to recent decline with debility. Foley placed due to urinary retention,  Myrbetriq   discontinued and laxative added to manage constipation. . .  Therapy has been working with patient who is limited by weakness and fatigue and requires increased time with rest break for grooming tasks as well as min to mod assist for bed mobility and is able to take a couple of steps with cues/truncal support. Prior to recent illness, patient had a sitter for 8 hours during the day but was able to ambulate with supervision?. She takes turns living 6 months at a time . Therapy team recommended CIR and family requested transfer back to [facility] for rehab   ROS   Past Medical History:  Diagnosis Date   Anemia    hx of   Arthritis    Breast lump    hx of   Cataract    left eye   Chronic back pain    Diabetes mellitus    Hyperlipidemia    Hypertension    sees Dr. Wanda Shave, [facility] internal medicine   Normochromic normocytic anemia 05/22/2017   Hb 10 MCV 85.6 No monoclonal spike on SPEP  Creatinine 1.3-1.6   Peripheral vascular disease (HCC)    Vitreomacular adhesion of right eye 02/03/2020   Vitrectomy, PRP to release VMT, 11-11-2020    Past Surgical History:  Procedure Laterality Date   APPENDECTOMY     BACK SURGERY      BREAST CYST EXCISION Left    BREAST SURGERY     cyst removed left side   BUNIONECTOMY     CARDIOVASCULAR STRESS TEST     COLONOSCOPY     DILATION AND CURETTAGE OF UTERUS     TONSILLECTOMY      Family History  Problem Relation Age of Onset   Breast cancer Sister 92    Social History:  reports that she has never smoked. She has never used smokeless tobacco. She reports that she does not drink alcohol  and does not use drugs.    Allergies  Allergen Reactions   Januvia [Sitagliptin] Other (See Comments)    Numbness in mouth   Simvastatin Other (See Comments)    tremors   Medications Prior to Admission  Medication Sig Dispense Refill   acetaminophen  (TYLENOL ) 325 MG tablet Take 2 tablets (650 mg total) by mouth every 4 (four) hours as needed for mild pain ((score 1 to 3) or temp > 100.5).     amLODipine  (NORVASC ) 5 MG tablet Take 1 tablet (5 mg total) by mouth every evening. 90 tablet 3   aspirin  81 MG tablet Take 81 mg by mouth daily.     Bacillus Coagulans-Inulin (ALIGN PREBIOTIC-PROBIOTIC PO) Take by mouth.     cefdinir  (OMNICEF ) 300 MG capsule Take 1 capsule (300 mg total) by mouth 2 (  two) times daily. 14 capsule 0   Cholecalciferol (VITAMIN D3) 125 MCG (5000 UT) TABS Take 1 tablet (5,000 Units total) by mouth daily. 30 tablet 0   COVID-19 mRNA Vac-TriS, Pfizer, (COMIRNATY ) SUSP injection Inject into the muscle. 0.3 mL 0   cyclobenzaprine  (FLEXERIL ) 10 MG tablet Take 1 tablet (10 mg total) by mouth 3 (three) times daily as needed for muscle spasms. 60 tablet 0   docusate sodium  (COLACE) 250 MG capsule Take 250 mg by mouth daily.     Ferrous Sulfate  (SLOW RELEASE IRON PO) Take 1 tablet by mouth daily.     fluconazole  (DIFLUCAN ) 200 MG tablet Take 1 tablet by mouth as needed for yeast infection, no more than 1 dose in 3 days. 5 tablet 1   fluticasone  (FLONASE ) 50 MCG/ACT nasal spray Place 1 spray into both nostrils daily as needed for allergies or rhinitis.     fluticasone   (FLONASE ) 50 MCG/ACT nasal spray Spray 2 sprays into each nostril every day once daily. 16 g 3   fluticasone  (FLONASE ) 50 MCG/ACT nasal spray Place 2 sprays into both nostrils daily. 48 g 3   galantamine  (RAZADYNE  ER) 8 MG 24 hr capsule Take 1 capsule (8 mg total) by mouth every morning with food. 90 capsule 3   galantamine  (RAZADYNE  ER) 8 MG 24 hr capsule Take 1 capsule (8 mg total) by mouth every morning with food. 90 capsule 3   glucose blood (ONETOUCH VERIO) test strip Use 1 test strip to check blood glucose once a day. 100 each 3   hydrALAZINE  (APRESOLINE ) 25 MG tablet Take 1 tablet (25 mg total) by mouth 2 (two) times daily. 180 tablet 3   hydrocortisone  (ANUCORT-HC ) 25 MG suppository Place 1 suppository (25 mg total) rectally 3 (three) times daily. 30 suppository 0   Lactobacillus-Inulin (PROBIOTIC DIGESTIVE SUPPORT PO) Take 2 each by mouth every morning.     Lancets (ONETOUCH DELICA PLUS LANCET33G) MISC Apply 1 each topically daily.     Lidocaine -Hydrocort , Perianal, 3-0.5 % CREA Apply to rectum 2 (two) times daily as needed externally. 28.35 g 1   linaclotide  (LINZESS ) 145 MCG CAPS capsule Take 1 capsule (145 mcg total) by mouth daily without food. 90 capsule 4   mirabegron  ER (MYRBETRIQ ) 50 MG TB24 tablet Take 1 tablet (50 mg total) by mouth daily. 90 tablet 3   montelukast  (SINGULAIR ) 10 MG tablet Take 1 tablet (10 mg total) by mouth daily. 90 tablet 4   montelukast  (SINGULAIR ) 10 MG tablet Take 1 tablet (10 mg total) by mouth daily. 90 tablet 4   Multiple Vitamins-Minerals (MULTIVITAMIN PO) Take 2 each by mouth daily.      nebivolol  (BYSTOLIC ) 5 MG tablet Take 1 tablet (5 mg total) by mouth at bedtime. 30 tablet 11   ONETOUCH VERIO test strip USE 1 TEST STRIP TO CHECK BLOOD GLUCOSE ONCE A DAY DX  E11.9     pioglitazone  (ACTOS ) 30 MG tablet Take 1 tablet (30 mg total) by mouth daily. 30 tablet 0   rosuvastatin  (CRESTOR ) 5 MG tablet Take 1 tablet (5 mg total) by mouth daily. 30 tablet 0    rosuvastatin  (CRESTOR ) 5 MG tablet Take 1 tablet (5 mg total) by mouth daily. 90 tablet 4   rosuvastatin  (CRESTOR ) 5 MG tablet Take 1 tablet (5 mg total) by mouth daily. 90 tablet 3   RSV vaccine recomb adjuvanted (AREXVY ) 120 MCG/0.5ML injection Inject into the muscle. 0.5 mL 0   vitamin B-12 (CYANOCOBALAMIN ) 1000 MCG tablet  Take 1,000 mcg by mouth daily. OTC     vitamin C  (ASCORBIC ACID) 500 MG tablet Take 500 mg by mouth daily.        Home:     Functional History:    Functional Status:  Mobility: Min to mod assist for transfers Able to take 2 steps to transfer with mod assist.  Working on BLE exercises.        ADL:    Cognition:      Physical Exam: There were no vitals taken for this visit. Physical Exam Vitals and nursing note reviewed.     No results found for this or any previous visit (from the past 48 hours). No results found.    There were no vitals taken for this visit.  Medical Problem List and Plan: 1. Functional deficits secondary to ***  -patient may *** shower  -ELOS/Goals: *** 2.  Antithrombotics: -DVT/anticoagulation:  Pharmaceutical: Lovenox   -antiplatelet therapy: N/A 3. Pain Management: Tylenol  prn.  4. Mood/Behavior/Sleep: LCSW to follow for evaluation and support.   -antipsychotic agents: N/A 5. Neuropsych/cognition: This patient is not fully capable of making decisions on her own behalf. 6. Skin/Wound Care: Routine pressure relief meausre.  7. Fluids/Electrolytes/Nutrition: Monitor I/O. Check CMET in am.  8.HTN: Monitor BP TID- continue amlodipine  and hydralazine .  9. Anemia of chronic disease: Negative work up by Dr. Freddie and Dr Lanny last month.    10. T2DM: Diet controlled- Monitor BS ac/hs. 11. H/o UTIs/Urinary retention: Question due to Merbetriq--d/c a couple of days ago? Foley replaced. Will remove and start bladder program  --monitor voiding with PVR/bladder scan. 12. Dementia: Used to be on On Galantamine  and on  Donepezil  at OSH-->will change back to home regimen per family request.  13. Constipation: Resolved with enema yesterday. Reports of poor intake.  14.  CKD 4?3b?: Monitor with serial checks as well as UOP/voiding   ***  Sharlet GORMAN Schmitz, PA-C 06/07/2024

## 2024-06-08 DIAGNOSIS — E1122 Type 2 diabetes mellitus with diabetic chronic kidney disease: Secondary | ICD-10-CM

## 2024-06-08 DIAGNOSIS — D638 Anemia in other chronic diseases classified elsewhere: Secondary | ICD-10-CM | POA: Diagnosis not present

## 2024-06-08 DIAGNOSIS — I10 Essential (primary) hypertension: Secondary | ICD-10-CM | POA: Diagnosis not present

## 2024-06-08 DIAGNOSIS — R5381 Other malaise: Secondary | ICD-10-CM | POA: Diagnosis not present

## 2024-06-08 LAB — COMPREHENSIVE METABOLIC PANEL WITH GFR
ALT: 10 U/L (ref 0–44)
AST: 14 U/L — ABNORMAL LOW (ref 15–41)
Albumin: 2.6 g/dL — ABNORMAL LOW (ref 3.5–5.0)
Alkaline Phosphatase: 48 U/L (ref 38–126)
Anion gap: 7 (ref 5–15)
BUN: 16 mg/dL (ref 8–23)
CO2: 21 mmol/L — ABNORMAL LOW (ref 22–32)
Calcium: 7.8 mg/dL — ABNORMAL LOW (ref 8.9–10.3)
Chloride: 110 mmol/L (ref 98–111)
Creatinine, Ser: 1.39 mg/dL — ABNORMAL HIGH (ref 0.44–1.00)
GFR, Estimated: 36 mL/min — ABNORMAL LOW (ref >60)
Glucose, Bld: 124 mg/dL — ABNORMAL HIGH (ref 70–99)
Potassium: 3.7 mmol/L (ref 3.5–5.1)
Sodium: 138 mmol/L (ref 135–145)
Total Bilirubin: 0.5 mg/dL (ref 0.0–1.2)
Total Protein: 5.4 g/dL — ABNORMAL LOW (ref 6.5–8.1)

## 2024-06-08 LAB — CBC WITH DIFFERENTIAL/PLATELET
Abs Immature Granulocytes: 0.01 K/uL (ref 0.00–0.07)
Basophils Absolute: 0.1 K/uL (ref 0.0–0.1)
Basophils Relative: 1 %
Eosinophils Absolute: 0.3 K/uL (ref 0.0–0.5)
Eosinophils Relative: 5 %
HCT: 25.9 % — ABNORMAL LOW (ref 36.0–46.0)
Hemoglobin: 8.4 g/dL — ABNORMAL LOW (ref 12.0–15.0)
Immature Granulocytes: 0 %
Lymphocytes Relative: 28 %
Lymphs Abs: 1.8 K/uL (ref 0.7–4.0)
MCH: 29.1 pg (ref 26.0–34.0)
MCHC: 32.4 g/dL (ref 30.0–36.0)
MCV: 89.6 fL (ref 80.0–100.0)
Monocytes Absolute: 0.5 K/uL (ref 0.1–1.0)
Monocytes Relative: 8 %
Neutro Abs: 3.7 K/uL (ref 1.7–7.7)
Neutrophils Relative %: 58 %
Platelets: 171 K/uL (ref 150–400)
RBC: 2.89 MIL/uL — ABNORMAL LOW (ref 3.87–5.11)
RDW: 14.6 % (ref 11.5–15.5)
WBC: 6.4 K/uL (ref 4.0–10.5)
nRBC: 0 % (ref 0.0–0.2)

## 2024-06-08 LAB — HEMOGLOBIN AND HEMATOCRIT, BLOOD
HCT: 26.7 % — ABNORMAL LOW (ref 36.0–46.0)
Hemoglobin: 8.8 g/dL — ABNORMAL LOW (ref 12.0–15.0)

## 2024-06-08 LAB — GLUCOSE, CAPILLARY
Glucose-Capillary: 115 mg/dL — ABNORMAL HIGH (ref 70–99)
Glucose-Capillary: 165 mg/dL — ABNORMAL HIGH (ref 70–99)
Glucose-Capillary: 148 mg/dL — ABNORMAL HIGH (ref 70–99)
Glucose-Capillary: 143 mg/dL — ABNORMAL HIGH (ref 70–99)

## 2024-06-08 LAB — MAGNESIUM: Magnesium: 2.1 mg/dL (ref 1.7–2.4)

## 2024-06-08 LAB — HEMOGLOBIN A1C
Hgb A1c MFr Bld: 6.6 % — ABNORMAL HIGH (ref 4.8–5.6)
Mean Plasma Glucose: 143 mg/dL

## 2024-06-08 MED ORDER — HYDRALAZINE HCL 25 MG PO TABS
37.5000 mg | ORAL_TABLET | Freq: Two times a day (BID) | ORAL | Status: DC
  Administered 2024-06-09 – 2024-06-24 (×36): 37.5 mg via ORAL
  Filled 2024-06-08 (×33): qty 2

## 2024-06-08 NOTE — Plan of Care (Signed)
  Problem: RH Balance Goal: LTG: Patient will maintain dynamic sitting balance (OT) Description: LTG:  Patient will maintain dynamic sitting balance with assistance during activities of daily living (OT) Flowsheets (Taken 06/08/2024 1245) LTG: Pt will maintain dynamic sitting balance during ADLs with: Supervision/Verbal cueing Goal: LTG Patient will maintain dynamic standing with ADLs (OT) Description: LTG:  Patient will maintain dynamic standing balance with assist during activities of daily living (OT)  Flowsheets (Taken 06/08/2024 1245) LTG: Pt will maintain dynamic standing balance during ADLs with: Contact Guard/Touching assist   Problem: Sit to Stand Goal: LTG:  Patient will perform sit to stand in prep for activites of daily living with assistance level (OT) Description: LTG:  Patient will perform sit to stand in prep for activites of daily living with assistance level (OT) Flowsheets (Taken 06/08/2024 1245) LTG: PT will perform sit to stand in prep for activites of daily living with assistance level: Supervision/Verbal cueing   Problem: RH Grooming Goal: LTG Patient will perform grooming w/assist,cues/equip (OT) Description: LTG: Patient will perform grooming with assist, with/without cues using equipment (OT) Flowsheets (Taken 06/08/2024 1245) LTG: Pt will perform grooming with assistance level of: Independent with assistive device    Problem: RH Bathing Goal: LTG Patient will bathe all body parts with assist levels (OT) Description: LTG: Patient will bathe all body parts with assist levels (OT) Flowsheets (Taken 06/08/2024 1245) LTG: Pt will perform bathing with assistance level/cueing: Contact Guard/Touching assist   Problem: RH Dressing Goal: LTG Patient will perform upper body dressing (OT) Description: LTG Patient will perform upper body dressing with assist, with/without cues (OT). Flowsheets (Taken 06/08/2024 1245) LTG: Pt will perform upper body dressing with assistance level of:  Set up assist Goal: LTG Patient will perform lower body dressing w/assist (OT) Description: LTG: Patient will perform lower body dressing with assist, with/without cues in positioning using equipment (OT) Flowsheets (Taken 06/08/2024 1245) LTG: Pt will perform lower body dressing with assistance level of: Supervision/Verbal cueing   Problem: RH Toileting Goal: LTG Patient will perform toileting task (3/3 steps) with assistance level (OT) Description: LTG: Patient will perform toileting task (3/3 steps) with assistance level (OT)  Flowsheets (Taken 06/08/2024 1245) LTG: Pt will perform toileting task (3/3 steps) with assistance level: Supervision/Verbal cueing   Problem: RH Toilet Transfers Goal: LTG Patient will perform toilet transfers w/assist (OT) Description: LTG: Patient will perform toilet transfers with assist, with/without cues using equipment (OT) Flowsheets (Taken 06/08/2024 1245) LTG: Pt will perform toilet transfers with assistance level of: Contact Guard/Touching assist

## 2024-06-08 NOTE — Progress Notes (Addendum)
 PROGRESS NOTE   Subjective/Complaints:  Patient reports she feels well today overall.  No new complaints or concerns.  Discussed with daughter BP medications, patient was previously on nebivolol  at night and also taking amlodipine  at night instead of in the morning.  She was told to by PCP to take the amlodipine  at night.  ROS: Patient denies Chills, dizziness, nausea, vomiting, diarrhea,  constipation, shortness of breath or chest pain, headache, or mood change.   Objective:   No results found. Recent Labs    06/08/24 0506  WBC 6.4  HGB 8.4*  HCT 25.9*  PLT 171   Recent Labs    06/08/24 0506  NA 138  K 3.7  CL 110  CO2 21*  GLUCOSE 124*  BUN 16  CREATININE 1.39*  CALCIUM  7.8*    Intake/Output Summary (Last 24 hours) at 06/08/2024 2012 Last data filed at 06/08/2024 0005 Gross per 24 hour  Intake --  Output 225 ml  Net -225 ml        Physical Exam: Vital Signs Blood pressure (!) 171/60, pulse (!) 59, temperature 98.6 F (37 C), temperature source Oral, resp. rate 16, height 5' 7 (1.702 m), weight 103.4 kg, SpO2 99%.  Constitutional: No apparent distress. Appropriate appearance for age.  Eating lunch HENT: [place]/AT Eyes: PERRLA. EOMI. Visual fields grossly intact.  Cardiovascular: RRR, no significant edema Respiratory: CTAB, nonlabored breathing Abdomen: + bowel sounds, normoactive. No distention or tenderness.  GU: Not examined.  Skin: C/D/I. No apparent lesions.  MSK:      No apparent deformity.       Neurologic exam:  Cognition: AAO to person, place, time and event.+ Mild delay in recall.  Language: Fluent, No substitutions or neoglisms. No dysarthria. Insight: Good  insight into current condition.  Mood: Very pleasant Sensation: To light touch intact in BL UEs and LEs  CN: 2-12 grossly intact.  Coordination: Bilateral lower extremity tremors with exertion      Strength: Bilateral upper  extremities 4/5 proximally, 5/5 distally      Bilateral lower extremities 3-/5 proximally, 4/5 distally   Prior neuro assessment is c/w today's exam 06/08/2024.   Assessment/Plan: 1. Functional deficits which require 3+ hours per day of interdisciplinary therapy in a comprehensive inpatient rehab setting. Physiatrist is providing close team supervision and 24 hour management of active medical problems listed below. Physiatrist and rehab team continue to assess barriers to discharge/monitor patient progress toward functional and medical goals  Care Tool:  Bathing    Body parts bathed by patient: Right arm, Left arm, Chest, Abdomen, Right upper leg, Left upper leg, Right lower leg, Left lower leg, Face     Body parts n/a: Buttocks, Front perineal area, Right lower leg, Left lower leg   Bathing assist Assist Level: Moderate Assistance - Patient 50 - 74%     Upper Body Dressing/Undressing Upper body dressing   What is the patient wearing?: Pull over shirt    Upper body assist Assist Level: Minimal Assistance - Patient > 75%    Lower Body Dressing/Undressing Lower body dressing      What is the patient wearing?: Underwear/pull up     Lower body  assist Assist for lower body dressing: Maximal Assistance - Patient 25 - 49%     Toileting Toileting    Toileting assist Assist for toileting: Maximal Assistance - Patient 25 - 49%     Transfers Chair/bed transfer  Transfers assist     Chair/bed transfer assist level: Moderate Assistance - Patient 50 - 74%     Locomotion Ambulation   Ambulation assist      Assist level: 2 helpers (minA and +2 WC follow for safety) Assistive device: Walker-rolling Max distance: 40'   Walk 10 feet activity   Assist     Assist level: 2 helpers (minA and +2 WC follow for safety) Assistive device: Walker-rolling   Walk 50 feet activity   Assist Walk 50 feet with 2 turns activity did not occur: Safety/medical concerns          Walk 150 feet activity   Assist Walk 150 feet activity did not occur: Safety/medical concerns         Walk 10 feet on uneven surface  activity   Assist Walk 10 feet on uneven surfaces activity did not occur: Safety/medical concerns         Wheelchair     Assist Is the patient using a wheelchair?: Yes Type of Wheelchair: Manual    Wheelchair assist level: Dependent - Patient 0%      Wheelchair 50 feet with 2 turns activity    Assist        Assist Level: Dependent - Patient 0%   Wheelchair 150 feet activity     Assist      Assist Level: Dependent - Patient 0%   Blood pressure (!) 171/60, pulse (!) 59, temperature 98.6 F (37 C), temperature source Oral, resp. rate 16, height 5' 7 (1.702 m), weight 103.4 kg, SpO2 99%.  Medical Problem List and Plan: 1. Functional deficits secondary to debility s/p UTI             -patient may shower             -ELOS/Goals: 21-24 days, Min A PT/OT             -Continue CIR   2.  Antithrombotics: -DVT/anticoagulation:  Pharmaceutical: Lovenox              -antiplatelet therapy: N/A 3. Pain Management: Tylenol  prn.  4. Mood/Behavior/Sleep: LCSW to follow for evaluation and support.              -antipsychotic agents: N/A 5. Neuropsych/cognition: This patient is not fully capable of making decisions on her own behalf. 6. Skin/Wound Care: Routine pressure relief meausre.  7. Fluids/Electrolytes/Nutrition: Monitor I/O. Check CMET in am.  8.HTN: Monitor BP TID- continue amlodipine  and hydralazine .     06/08/2024    7:38 PM 06/08/2024    3:31 PM 06/08/2024    5:15 AM  Vitals with BMI  Weight   227 lbs 15 oz  BMI   35.69  Systolic 171 170   Diastolic 60 53   Pulse 59 58   8/9 increase hydralazine  to 37.5mg  BID 8/10 reports patient had been advised to take amlodipine  at night, medication timing adjusted.  She was also taking nebivolol .  Will hold off on adding this today as we are changing amlodipine  timing.   If BP still elevated tomorrow consider adding low-dose Nebivolol   9. Anemia of chronic disease: Negative work up by Dr. Freddie and Dr Lanny last month.     - Appears to be gradually trending  down on review on records. Check FOBT  -8/10 CBC tomorrow, FOBT 10. T2DM: Diet controlled- Monitor BS ac/hs.  8/10 fair control overall, continue to monitor CBG (last 3)  Recent Labs    06/08/24 0543 06/08/24 1128 06/08/24 1719  GLUCAP 115* 165* 148*    11. H/o UTIs/Urinary retention: Question due to Merbetriq--d/c a couple of days ago? Foley replaced. Will remove and start bladder program             -monitor voiding with PVR/bladder scan.  12. Dementia: Used to be on On Galantamine  and now on Donepezil .  13. Constipation: Had enema yesterday after no BM X one week  -8/9 LBM today  - 8/10 lactulose  changed from daily to as needed, had a large bowel movement yesterday 14.  CKD 4?3b?: Monitor with serial checks as well as UOP/voiding   - 8/9 creatinine 1.39, overall stable continue to monitor  Recheck labs tomorrow    LOS: 1 days A FACE TO FACE EVALUATION WAS PERFORMED  Murray Collier 06/08/2024, 8:12 PM

## 2024-06-08 NOTE — Evaluation (Signed)
 Speech Language Pathology Assessment and Plan  Patient Details  Name: [name] Mathews MRN: [id number] Date of Birth: 1934-10-26  SLP Diagnosis:  (n/a - skilled ST not warranted)  Rehab Potential:  n/a - skilled ST not warranted  ELOS: n/a - skilled ST not warranted    Today's Date: 06/08/2024 SLP Individual Time: 1030-1130 SLP Individual Time Calculation (min): 60 min   Hospital Problem: Principal Problem:   Debility  Past Medical History:  Past Medical History:  Diagnosis Date   Anemia    hx of   Arthritis    Breast lump    hx of   Cataract    left eye   Chronic back pain    Diabetes mellitus    Hyperlipidemia    Hypertension    sees Dr. 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   Vitrectomy, PRP to release VMT, 11-11-2020   Past Surgical History:  Past Surgical History:  Procedure Laterality Date   APPENDECTOMY     BACK SURGERY     BREAST CYST EXCISION Left    BREAST SURGERY     cyst removed left side   BUNIONECTOMY     CARDIOVASCULAR STRESS TEST     COLONOSCOPY     DILATION AND CURETTAGE OF UTERUS     TONSILLECTOMY      Assessment / Plan / Recommendation Clinical Impression Pt is an 89 year old female T2DM, chronic back pain s/p decompression X 2 (CIR 2013/2020), HTN, CKD 4,  OAB, mild dementia, worsening of anemia w/fatigue, UTI treated with antibiotics without improvement and decline in mobility  with inability to move out of bed. She was admitted ot [facility] for work up and started on Rocephin  briefly due to concerns of UTI. MRI brain and    MRI lumbar spine showed posterior fusion L2-L5 with multilevel spondylosis and mild central canal narrowing L1-L2 and L5-S1 but limited due to motion/hardware.  PT/OT consulted due to recent decline with debility. Foley placed  due to urinary retention,  Myrbetriq   discontinued and laxative added to manage constipation. . .  Therapy has been working with patient who is limited by weakness and fatigue and requires increased time with rest break for grooming tasks as well as min to mod assist for bed mobility and is able to take a couple of steps with cues/truncal support. Prior to recent illness, patient had a sitter for 8 hours during the day but was able to ambulate with supervision?. She takes turns living 6 months at a time . Therapy team recommended CIR and family requested transfer back to [facility] for rehab   Of note, pt asleep upon SLP arrival and required cueing to remain awake throughout eval tasks. She presented w/ severe cognitive deficits overall: noted in the areas of STM, attention, problem solving, and organization. She completed the [facility] Mental Status (SLUMS) Exam and scored a 9/30. [name] and son reported exacerbation of cognitive deficits w/ recent UTI, but that she has returned to her cognitive baseline as of last night. No concerns re oropharyngeal dysphagia. Given return to cognitive baseline and assistance available in the home environment, skilled ST not warranted at this time.     Skilled Therapeutic Interventions          SLP facilitated a cognitive-linguistic  evaluation to assess pt's cognitive-communication skills and determine need for additional skilled ST services. See above for more information.    SLP Assessment  Patient does not need any further Speech Lanaguage Pathology Services    Recommendations  SLP Diet Recommendations: Age appropriate regular solids;Thin Liquid Administration via: Straw Medication Administration: Whole meds with liquid Patient destination: Home Follow up Recommendations: None;24 hour supervision/assistance Equipment Recommended: None recommended by SLP    SLP Frequency  (n/a - skilled ST not warranted)   SLP Duration  SLP Intensity  SLP  Treatment/Interventions n/a - skilled ST not warranted   (n/a - skilled ST not warranted)   (n/a - skilled ST not warranted)    Pain Pain Assessment Pain Scale: 0-10 Pain Score: 0-No pain  Prior Functioning Type of Home: House  Lives With: Daughter Available Help at Discharge: Available 24 hours/day;Personal care attendant;Family Vocation: Retired  Architectural technologist Overall Cognitive Status: History of cognitive impairments - at baseline Arousal/Alertness: Awake/alert Orientation Level: Oriented to person;Oriented to time Year: 2025 Month: August Day of Week: Incorrect Attention: Sustained Sustained Attention: Impaired Memory: Impaired Awareness: Appears intact Problem Solving: Impaired Executive Function: Landscape architect: Impaired Safety/Judgment: Appears intact  Comprehension Auditory Comprehension Overall Auditory Comprehension: Appears within functional limits for tasks assessed Expression Expression Primary Mode of Expression: Verbal Verbal Expression Overall Verbal Expression: Appears within functional limits for tasks assessed Written Expression Dominant Hand: Left Oral Motor Oral Motor/Sensory Function Overall Oral Motor/Sensory Function: Within functional limits Motor Speech Overall Motor Speech: Appears within functional limits for tasks assessed  Care Tool Care Tool Cognition Ability to hear (with hearing aid or hearing appliances if normally used Ability to hear (with hearing aid or hearing appliances if normally used): 1. Minimal difficulty - difficulty in some environments (e.g. when person speaks softly or setting is noisy)   Expression of Ideas and Wants Expression of Ideas and Wants: 4. Without difficulty (complex and basic) - expresses complex messages without difficulty and with speech that is clear and easy to understand   Understanding Verbal and Non-Verbal Content Understanding Verbal and Non-Verbal Content: 4. Understands  (complex and basic) - clear comprehension without cues or repetitions  Memory/Recall Ability Memory/Recall Ability : Current season;That he or she is in a hospital/hospital unit   Motor Speech Assessment  WFL     Short Term Goals: No short term goals set  Refer to Care Plan for Long Term Goals  Recommendations for other services: None   Discharge Criteria: Patient will be discharged from SLP if patient refuses treatment 3 consecutive times without medical reason, if treatment goals not met, if there is a change in medical status, if patient makes no progress towards goals or if patient is discharged from hospital.  The above assessment, treatment plan, treatment alternatives and goals were discussed and mutually agreed upon: by patient and by family  Recardo DELENA Mole 06/08/2024, 11:36 AM

## 2024-06-08 NOTE — Evaluation (Signed)
 Physical Therapy Assessment and Plan  Patient Details  Name: [name] Mathews MRN: [id number] Date of Birth: 07/12/1934  PT Diagnosis: Abnormality of gait, Difficulty walking, Impaired cognition, Low back pain, and Muscle weakness Rehab Potential: Good ELOS: 2.5-3 weeks   Today's Date: 06/08/2024 PT Individual Time: 8696-8584 PT Individual Time Calculation (min): 72 min    Hospital Problem: Principal Problem:   Debility   Past Medical History:  Past Medical History:  Diagnosis Date   Anemia    hx of   Arthritis    Breast lump    hx of   Cataract    left eye   Chronic back pain    Diabetes mellitus    Hyperlipidemia    Hypertension    sees Dr. 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   Vitrectomy, PRP to release VMT, 11-11-2020   Past Surgical History:  Past Surgical History:  Procedure Laterality Date   APPENDECTOMY     BACK SURGERY     BREAST CYST EXCISION Left    BREAST SURGERY     cyst removed left side   BUNIONECTOMY     CARDIOVASCULAR STRESS TEST     COLONOSCOPY     DILATION AND CURETTAGE OF UTERUS     TONSILLECTOMY      Assessment & Plan Clinical Impression: Patient is a 89 year old female T2DM, chronic back pain s/p decompression X 2 (CIR 2013/2020), HTN, CKD 4,  OAB, mild dementia, worsening of anemia w/fatigue, UTI treated with antibiotics without improvement and decline in mobility  with inability to move out of bed. She was admitted ot [facility] for work up and started on Rocephin  briefly due to concerns of UTI. MRI brain and    MRI lumbar spine showed posterior fusion L2-L5 with multilevel spondylosis and mild central canal narrowing L1-L2 and L5-S1 but limited due to motion/hardware.  PT/OT consulted due to recent decline with debility. Foley placed due to  urinary retention,  Myrbetriq   discontinued and laxative added to manage constipation. . .  Therapy has been working with patient who is limited by weakness and fatigue and requires increased time with rest break for grooming tasks as well as min to mod assist for bed mobility and is able to take a couple of steps with cues/truncal support. Prior to recent illness, patient had a sitter for 8 hours during the day but was able to ambulate with supervision?. She takes turns living 6 months at a time . Therapy team recommended CIR and family requested transfer back to [facility] for rehab  Patient currently requires max with mobility secondary to muscle weakness, decreased cardiorespiratoy endurance, decreased initiation, decreased problem solving, and decreased memory, and decreased standing balance, decreased postural control, and decreased balance strategies.  Prior to hospitalization, patient was supervision with mobility and lived with Daughter in a House home.  Home access is 2Stairs to enter.  Patient will benefit from skilled PT intervention to maximize safe functional mobility, minimize fall risk, and decrease caregiver burden for planned discharge home with 24 hour supervision.  Anticipate patient will benefit from follow up HH at discharge.  PT - End of Session Activity Tolerance: Tolerates 30+ min activity with multiple rests Endurance Deficit: Yes Endurance Deficit Description: rest breaks with all mobility tasks PT Assessment Rehab Potential (  ACUTE/IP ONLY): Good PT Barriers to Discharge: Inaccessible home environment;Decreased caregiver support;Home environment access/layout PT Barriers to Discharge Comments: 2 STE and 14 steps to main bed and bath, requires more assist than pt daughter able ot provide at this time PT Patient demonstrates impairments in the following area(s): Balance;Behavior;Endurance;Motor;Pain;Perception;Safety;Sensory PT Transfers Functional Problem(s): Bed Mobility;Bed  to Chair;Car;Furniture PT Locomotion Functional Problem(s): Ambulation;Stairs PT Plan PT Intensity: Minimum of 1-2 x/day ,45 to 90 minutes PT Frequency: 5 out of 7 days PT Duration Estimated Length of Stay: 2.5-3 weeks PT Treatment/Interventions: Ambulation/gait training;Discharge planning;Functional mobility training;Psychosocial support;Therapeutic Activities;Balance/vestibular training;Disease management/prevention;Neuromuscular re-education;Therapeutic Exercise;Cognitive remediation/compensation;DME/adaptive equipment instruction;Pain management;Splinting/orthotics;UE/LE Strength taining/ROM;Community reintegration;Functional electrical stimulation;Patient/family education;Stair training;UE/LE Coordination activities PT Transfers Anticipated Outcome(s): supervision PT Locomotion Anticipated Outcome(s): supervision ambulatory PT Recommendation Recommendations for Other Services: None Follow Up Recommendations: Home health PT Patient destination: Home Equipment Recommended: To be determined   PT Evaluation Precautions/Restrictions Precautions Precautions: Fall Recall of Precautions/Restrictions: Intact Restrictions Weight Bearing Restrictions Per Provider Order: No Pain Interference Pain Interference Pain Effect on Sleep: 3. Frequently Pain Interference with Therapy Activities: 1. Rarely or not at all Pain Interference with Day-to-Day Activities: 1. Rarely or not at all Home Living/Prior Functioning Home Living Available Help at Discharge: Available 24 hours/day;Personal care attendant;Family Type of Home: House Home Access: Stairs to enter Secretary/administrator of Steps: 2 Entrance Stairs-Rails: Can reach both Home Layout: Two level;Bed/bath upstairs Alternate Level Stairs-Number of Steps: 7 landing 7 Alternate Level Stairs-Rails: Can reach both Bathroom Shower/Tub: Health visitor: Handicapped height Bathroom Accessibility: Yes Additional Comments: pt  reports having to use RW for mobility over the past 2-3 months due to progression of weakness  Lives With: Daughter Prior Function Level of Independence: Requires assistive device for independence;Needs assistance with ADLs;Needs assistance with gait;Needs assistance with tranfers (supervision)  Able to Take Stairs?: Yes Driving: No Vocation: Retired IT consultant Overall Cognitive Status: History of cognitive impairments - at baseline Arousal/Alertness: Awake/alert Orientation Level: Oriented to person;Oriented to time Year: 2025 Month: August Day of Week: Incorrect Attention: Sustained Sustained Attention: Impaired Memory: Impaired Problem Solving: Impaired Executive Function: Landscape architect: Impaired Safety/Judgment: Appears intact Sensation Sensation Light Touch: Appears Intact Hot/Cold: Not tested Proprioception: Not tested Stereognosis: Not tested Coordination Gross Motor Movements are Fluid and Coordinated: Yes Fine Motor Movements are Fluid and Coordinated: Yes Motor  Motor Motor: Within Functional Limits Motor - Skilled Clinical Observations: decreased smoothness and accuracy secondary to debility and global strength deficits  Trunk/Postural Assessment  Thoracic Assessment Thoracic Assessment: Exceptions to [facility] (kyphosis) Lumbar Assessment Lumbar Assessment: Exceptions to [facility] (posterior tilt) Postural Control Postural Control: Deficits on evaluation Righting Reactions: delayed/inadequate Protective Responses: delayed/inadequate Postural Limitations: decreased  Balance Balance Balance Assessed: Yes Static Sitting Balance Static Sitting - Level of Assistance: 5: Stand by assistance Dynamic Sitting Balance Dynamic Sitting - Balance Support: Feet supported Dynamic Sitting - Level of Assistance: 4: Min assist Static Standing Balance Static Standing - Balance Support: Bilateral upper extremity supported;During functional activity Static Standing - Level of  Assistance: 4: Min assist Dynamic Standing Balance Dynamic Standing - Balance Support: Bilateral upper extremity supported;During functional activity Dynamic Standing - Level of Assistance: 4: Min assist Extremity Assessment  RLE Assessment RLE Assessment: Exceptions to [facility] Passive Range of Motion (PROM) Comments: decreased DF lacking ~5* from neutral General Strength Comments: grossly 4/5 LLE Assessment LLE Assessment: Exceptions to [facility] General Strength Comments: grossly 4/5  Care Tool Care Tool Bed Mobility Roll left and right activity   Roll left and right assist  level: Moderate Assistance - Patient 50 - 74%    Sit to lying activity   Sit to lying assist level: Moderate Assistance - Patient 50 - 74%    Lying to sitting on side of bed activity   Lying to sitting on side of bed assist level: the ability to move from lying on the back to sitting on the side of the bed with no back support.: Minimal Assistance - Patient > 75%     Care Tool Transfers Sit to stand transfer   Sit to stand assist level: Moderate Assistance - Patient 50 - 74%    Chair/bed transfer   Chair/bed transfer assist level: Moderate Assistance - Patient 50 - 74%    Car transfer   Car transfer assist level: Moderate Assistance - Patient 50 - 74% (simulated with bed/chair transfer)      Care Tool Locomotion Ambulation   Assist level: 2 helpers (minA and +2 WC follow for safety) Assistive device: Walker-rolling Max distance: 40'  Walk 10 feet activity   Assist level: 2 helpers (minA and +2 WC follow for safety) Assistive device: Walker-rolling   Walk 50 feet with 2 turns activity Walk 50 feet with 2 turns activity did not occur: Safety/medical concerns      Walk 150 feet activity Walk 150 feet activity did not occur: Safety/medical concerns      Walk 10 feet on uneven surfaces activity Walk 10 feet on uneven surfaces activity did not occur: Safety/medical concerns      Stairs Stair activity did  not occur: Safety/medical concerns        Walk up/down 1 step activity Walk up/down 1 step or curb (drop down) activity did not occur: Safety/medical concerns      Walk up/down 4 steps activity Walk up/down 4 steps activity did not occur: Safety/medical concerns      Walk up/down 12 steps activity Walk up/down 12 steps activity did not occur: Safety/medical concerns      Pick up small objects from floor Pick up small object from the floor (from standing position) activity did not occur: Safety/medical concerns      Wheelchair Is the patient using a wheelchair?: Yes Type of Wheelchair: Manual   Wheelchair assist level: Dependent - Patient 0%    Wheel 50 feet with 2 turns activity   Assist Level: Dependent - Patient 0%  Wheel 150 feet activity   Assist Level: Dependent - Patient 0%    Refer to Care Plan for Long Term Goals  SHORT TERM GOAL WEEK 1 PT Short Term Goal 1 (Week 1): Pt will complete bed mobility with min assist PT Short Term Goal 2 (Week 1): Pt will complete sit to stand with min assist consistently with LRAD PT Short Term Goal 3 (Week 1): Pt will ambulate 80' with min assist and LRAD PT Short Term Goal 4 (Week 1): Pt will complete up/down one 3 step with BHRs min assist  Recommendations for other services: None   Skilled Therapeutic Intervention Evaluation completed (see details above and below) with education on PT POC and goals and individual treatment initiated with focus on gait training and therapeutic activities to promote transfer training, bed mobility training and upright tolerance. Pt denies pain at rest however reports R low back pain with mobility and in sitting. Pt educated on nonpharmacologic interventions for pain including heat with pt refusing at this time. Pt completes bed mobility with elevated HOB min assist with use of hospital bed rails. Pt completes transfers  with min/mod assist throughout session, increased time to complete with pt  demonstrating increased retropulsion with fearful behaviors occurring on one occasion during session. Pt completes stand step transfer with RW min assist for postural stability during transfer. Pt ambulates 40' with RW min assist and +2 WC follow for safety with fatigue, pt demonstrating forward flexed trunk and decreased proximity to RW, shuffling gait with cues for forward gaze, pt able to turn to sit on EOM. Pt requires extended seated rest break then completes x3 sit<>stands elevated from EOM with cues for anterior wt shift, completes first stand with mod assist and progress to min assist with decreased assist provided and allowed increased time to complete. Pt returns to room and completes stand step transfer with RW to bed, completes bed mobility with mod assist and pt remains semi reclined with all needs within reach, cal light in place and bed alarm activated at end of session.    Mobility Bed Mobility Bed Mobility: Supine to Sit;Sit to Supine Supine to Sit: Minimal Assistance - Patient > 75% Sit to Supine: Moderate Assistance - Patient 50-74% Transfers Transfers: Sit to Stand;Stand to Sit;Stand Pivot Transfers Sit to Stand: Moderate Assistance - Patient 50-74% Stand to Sit: Moderate Assistance - Patient 50-74% Stand Pivot Transfers: Moderate Assistance - Patient 50 - 74% Stand Pivot Transfer Details: Visual cues/gestures for precautions/safety;Tactile cues for posture;Verbal cues for safe use of DME/AE;Verbal cues for precautions/safety Transfer (Assistive device): Rolling walker Locomotion  Gait Ambulation: Yes Gait Assistance: 2 Helpers (minA and +2 WC follow for safety) Gait Distance (Feet): 40 Feet Assistive device: Rolling walker Gait Assistance Details: Verbal cues for precautions/safety;Verbal cues for technique;Verbal cues for safe use of DME/AE Gait Gait: Yes Gait Pattern: Impaired Gait Pattern: Trunk flexed;Poor foot clearance - right;Poor foot clearance - left;Step-to  pattern Gait velocity: decreased Stairs / Additional Locomotion Stairs: No Wheelchair Mobility Wheelchair Mobility: No   Discharge Criteria: Patient will be discharged from PT if patient refuses treatment 3 consecutive times without medical reason, if treatment goals not met, if there is a change in medical status, if patient makes no progress towards goals or if patient is discharged from hospital.  The above assessment, treatment plan, treatment alternatives and goals were discussed and mutually agreed upon: by patient  Reche Ohara PT, DPT 06/08/2024, 3:27 PM

## 2024-06-08 NOTE — Evaluation (Signed)
 Occupational Therapy Assessment and Plan  Patient Details  Name: [name] Mathews MRN: [id number] Date of Birth: 25-Apr-1934  OT Diagnosis: abnormal posture, altered mental status, cognitive deficits, muscular wasting and disuse atrophy, and muscle weakness (generalized) Rehab Potential: Rehab Potential (ACUTE ONLY): Good ELOS: 18-22   Today's Date: 06/08/2024 OT Individual Time: 9199-9083 OT Individual Time Calculation (min): 76 min     Hospital Problem: Principal Problem:   Debility   Past Medical History:  Past Medical History:  Diagnosis Date   Anemia    hx of   Arthritis    Breast lump    hx of   Cataract    left eye   Chronic back pain    Diabetes mellitus    Hyperlipidemia    Hypertension    sees Dr. 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   Vitrectomy, PRP to release VMT, 11-11-2020   Past Surgical History:  Past Surgical History:  Procedure Laterality Date   APPENDECTOMY     BACK SURGERY     BREAST CYST EXCISION Left    BREAST SURGERY     cyst removed left side   BUNIONECTOMY     CARDIOVASCULAR STRESS TEST     COLONOSCOPY     DILATION AND CURETTAGE OF UTERUS     TONSILLECTOMY      Assessment & Plan Clinical Impression: Patient is a 89 y.o. female T2DM, chronic back pain s/p decompression X 2 (CIR 2013/2020), HTN, CKD 4,  OAB, mild dementia, worsening of anemia w/fatigue, UTI treated with antibiotics without improvement and decline in mobility  with inability to move out of bed. She was admitted ot [facility] for work up and started on Rocephin  briefly due to concerns of UTI. MRI brain and    MRI lumbar spine showed posterior fusion L2-L5 with multilevel spondylosis and mild central canal narrowing L1-L2 and L5-S1 but limited due to motion/hardware.  PT/OT consulted  due to recent decline with debility. Foley placed due to urinary retention,  Myrbetriq   discontinued and laxative added to manage constipation. . .  Therapy has been working with patient who is limited by weakness and fatigue and requires increased time with rest break for grooming tasks as well as min to mod assist for bed mobility and is able to take a couple of steps with cues/truncal support. Prior to recent illness, patient had a sitter for 8 hours during the day but was able to ambulate with supervision?. She takes turns living 6 months at a time . Therapy team recommended CIR and family requested transfer back to [facility] for rehab  Patient currently requires max with basic self-care skills secondary to muscle weakness, decreased cardiorespiratoy endurance, decreased problem solving and delayed processing, and decreased sitting balance, decreased standing balance, decreased postural control, and decreased balance strategies.  Prior to hospitalization, patient could complete ADLs with independent .  Patient will benefit from skilled intervention to decrease level of assist with basic self-care skills prior to discharge home with care partner.  Anticipate patient will require 24 hour supervision and follow up home health.  OT - End of Session Activity Tolerance: Tolerates 30+ min activity with multiple rests OT Assessment Rehab Potential (ACUTE ONLY): Good OT Barriers to Discharge: Home environment access/layout OT Barriers to Discharge Comments: stairs to bedroom/ bathroom  OT Patient demonstrates impairments in the following area(s): Balance;Safety;Cognition;Endurance;Nutrition OT Basic ADL's Functional Problem(s): Grooming;Bathing;Dressing;Toileting OT Transfers Functional Problem(s): Toilet;Other (comment) (walk in shower) OT Additional Impairment(s): Fuctional Use of Upper Extremity OT Plan OT Intensity: Minimum of 1-2 x/day, 45 to 90 minutes OT Frequency: 5 out of 7 days OT  Duration/Estimated Length of Stay: 18-22 OT Treatment/Interventions: Balance/vestibular training;Discharge planning;Self Care/advanced ADL retraining;Therapeutic Activities;UE/LE Coordination activities;Wheelchair propulsion/positioning;UE/LE Strength taining/ROM;Neuromuscular re-education;DME/adaptive equipment instruction;Community reintegration;Psychosocial support;Cognitive remediation/compensation;Disease mangement/prevention;Functional mobility training;Patient/family education;Skin care/wound managment;Therapeutic Exercise;Visual/perceptual remediation/compensation OT Self Feeding Anticipated Outcome(s): mod I OT Basic Self-Care Anticipated Outcome(s): CGA OT Toileting Anticipated Outcome(s): CGA OT Bathroom Transfers Anticipated Outcome(s): CGA OT Recommendation Recommendations for Other Services: Therapeutic Recreation consult Therapeutic Recreation Interventions: Stress management Patient destination: Home Follow Up Recommendations: 24 hour supervision/assistance;Home health OT Equipment Recommended: 3 in 1 bedside comode Equipment Details: patient has all AE   OT Evaluation Precautions/Restrictions  Precautions Precautions: Fall Recall of Precautions/Restrictions: Intact Restrictions Weight Bearing Restrictions Per Provider Order: No General Chart Reviewed: Yes Response to Previous Treatment: Not applicable Family/Caregiver Present: Yes Vital Signs   Pain Pain Assessment Pain Scale: 0-10 Pain Score: 0-No pain Home Living/Prior Functioning Home Living Family/patient expects to be discharged to:: Private residence Living Arrangements: Children Available Help at Discharge: Available 24 hours/day, Personal care attendant, Family Type of Home: House Home Access: Stairs to enter Secretary/administrator of Steps: 2 Entrance Stairs-Rails: Can reach both Home Layout: Two level, Bed/bath upstairs Alternate Level Stairs-Number of Steps: 7 landing 7 Alternate Level  Stairs-Rails: Can reach both Bathroom Shower/Tub: Health visitor: Handicapped height Bathroom Accessibility: Yes  Lives With: Daughter IADL History Current License: Yes Occupation: Retired Prior Function Level of Independence: Independent with basic ADLs, Independent with gait (SUP)  Able to Take Stairs?: Yes Driving: No Vocation: Retired Administrator, sports Baseline Vision/History: 1 Wears glasses Ability to See in Adequate Light: 0 Adequate Patient Visual Report: No change from baseline Vision Assessment?: Vision impaired- to be further tested in functional context Perception  Perception: Within Functional Limits Praxis Praxis: WFL Cognition Cognition Overall Cognitive Status: Within Functional Limits for tasks assessed Arousal/Alertness: Awake/alert Orientation Level: Person;Place;Situation Memory: Appears intact Awareness: Appears intact Problem Solving: Appears intact Safety/Judgment: Appears intact Brief Interview for Mental Status (BIMS) Repetition of Three Words (First Attempt): 3 Temporal Orientation: Year: Missed by 1 year Temporal Orientation: Month: Accurate within 5 days Temporal Orientation: Day: Correct Recall: Sock: Yes, no cue required Recall: Blue: Yes, no cue required Recall: Bed: Yes, no cue required BIMS Summary Score: 14 Sensation Sensation Light Touch: Appears Intact Coordination Gross Motor Movements are Fluid and Coordinated: Yes Fine Motor Movements are Fluid and Coordinated: Yes Finger Nose Finger Test: WFL increased time Motor  Motor Motor: Within Functional Limits  Trunk/Postural Assessment  Cervical Assessment Cervical Assessment: Within Functional Limits Thoracic Assessment Thoracic Assessment: Exceptions to [facility] (kyphosis) Lumbar Assessment Lumbar Assessment: Within Functional Limits (posterior tilt) Postural Control Postural Control: Deficits on evaluation  Balance Balance Balance Assessed: Yes Static Sitting  Balance Static Sitting - Level of Assistance: 5: Stand by assistance Dynamic Sitting Balance Dynamic Sitting - Balance Support: Feet supported Dynamic Sitting - Level of Assistance: 4: Min assist Static Standing Balance Static Standing - Balance Support: Bilateral upper extremity supported;During functional activity Static Standing - Level of Assistance: 3: Mod assist Dynamic Standing Balance Dynamic Standing - Balance Support: Right upper extremity supported;Bilateral upper extremity supported;Left upper extremity supported Dynamic Standing - Level of Assistance: 2: Max assist Extremity/Trunk Assessment RUE Assessment RUE Assessment: Within Functional  Limits Active Range of Motion (AROM) Comments: WFL General Strength Comments: 4-/5 LUE Assessment LUE Assessment: Within Functional Limits Active Range of Motion (AROM) Comments: WFL General Strength Comments: 4-/5  Care Tool Care Tool Self Care Eating   Eating Assist Level: Independent with assistive device    Oral Care    Oral Care Assist Level: Set up assist    Bathing   Body parts bathed by patient: Right arm;Left arm;Chest;Abdomen;Right upper leg;Left upper leg;Right lower leg;Left lower leg;Face   Body parts n/a: Buttocks;Front perineal area;Right lower leg;Left lower leg Assist Level: Moderate Assistance - Patient 50 - 74%    Upper Body Dressing(including orthotics)   What is the patient wearing?: Pull over shirt   Assist Level: Minimal Assistance - Patient > 75%    Lower Body Dressing (excluding footwear)   What is the patient wearing?: Underwear/pull up Assist for lower body dressing: Maximal Assistance - Patient 25 - 49%    Putting on/Taking off footwear   What is the patient wearing?: Socks Assist for footwear: Total Assistance - Patient < 25%       Care Tool Toileting Toileting activity   Assist for toileting: Maximal Assistance - Patient 25 - 49%     Care Tool Bed Mobility Roll left and right activity    Roll left and right assist level: Moderate Assistance - Patient 50 - 74%    Sit to lying activity   Sit to lying assist level: Moderate Assistance - Patient 50 - 74%    Lying to sitting on side of bed activity   Lying to sitting on side of bed assist level: the ability to move from lying on the back to sitting on the side of the bed with no back support.: Maximal Assistance - Patient 25 - 49%     Care Tool Transfers Sit to stand transfer   Sit to stand assist level: Maximal Assistance - Patient 25 - 49%    Chair/bed transfer   Chair/bed transfer assist level: Maximal Assistance - Patient 25 - 49%     Toilet transfer   Assist Level: Maximal Assistance - Patient 24 - 49%     Care Tool Cognition  Expression of Ideas and Wants Expression of Ideas and Wants: 4. Without difficulty (complex and basic) - expresses complex messages without difficulty and with speech that is clear and easy to understand  Understanding Verbal and Non-Verbal Content Understanding Verbal and Non-Verbal Content: 4. Understands (complex and basic) - clear comprehension without cues or repetitions   Memory/Recall Ability Memory/Recall Ability : Staff names and faces;That he or she is in a hospital/hospital unit;Current season   Refer to Care Plan for Long Term Goals  SHORT TERM GOAL WEEK 1 OT Short Term Goal 1 (Week 1): patient will maintain balance during ADLs with CGA for LB ADLS OT Short Term Goal 2 (Week 1): patient will complete peri hygiene with min A OT Short Term Goal 3 (Week 1): patient will don socks with min A with AE  Recommendations for other services: None    Skilled Therapeutic Intervention  Evaluation completed (see details above) with education on OT POC and goals and individual treatment initiated with focus on functional mobility/transfers, ADL re-training,  generalized strengthening and endurance, dynamic standing balance/coordination, pt/family education and discharge planning. Pt  education provided on therapy schedule and safety policy with use of chair alarm. Pt participated in goal setting. Pt participated in modified ADL task (See above for functional performance).  ADL ADL Equipment Provided: Reacher Eating: Modified independent Grooming: Setup Where Assessed-Grooming: Wheelchair Upper Body Bathing: Setup Where Assessed-Upper Body Bathing: Sitting at sink Lower Body Bathing: Moderate assistance Where Assessed-Lower Body Bathing: Sitting at sink Upper Body Dressing: Minimal assistance Where Assessed-Upper Body Dressing: Wheelchair Lower Body Dressing: Maximal assistance Where Assessed-Lower Body Dressing: Sitting at sink Where Assessed-Toileting: Bedside Commode Toilet Transfer: Maximal assistance Toilet Transfer Method: Stand pivot Film/video editor: Maximal assistance Film/video editor Method: Stand pivot Mobility  Bed Mobility Bed Mobility: Rolling Right;Rolling Left;Supine to Sit;Sit to Supine Rolling Right: Moderate Assistance - Patient 50-74% Rolling Left: Moderate Assistance - Patient 50-74% Supine to Sit: Maximal Assistance - Patient - Patient 25-49% Sit to Supine: Maximal Assistance - Patient 25-49% Transfers Sit to Stand: Maximal Assistance - Patient 25-49% Stand to Sit: Maximal Assistance - Patient 25-49%   Discharge Criteria: Patient will be discharged from OT if patient refuses treatment 3 consecutive times without medical reason, if treatment goals not met, if there is a change in medical status, if patient makes no progress towards goals or if patient is discharged from hospital.  The above assessment, treatment plan, treatment alternatives and goals were discussed and mutually agreed upon: by patient and by family  [name] Mathews 06/08/2024, 10:34 AM

## 2024-06-08 NOTE — Plan of Care (Signed)
  Problem: RH Balance Goal: LTG Patient will maintain dynamic standing balance (PT) Description: LTG:  Patient will maintain dynamic standing balance with assistance during mobility activities (PT) Flowsheets (Taken 06/08/2024 1535) LTG: Pt will maintain dynamic standing balance during mobility activities with:: Supervision/Verbal cueing   Problem: Sit to Stand Goal: LTG:  Patient will perform sit to stand with assistance level (PT) Description: LTG:  Patient will perform sit to stand with assistance level (PT) Flowsheets (Taken 06/08/2024 1535) LTG: PT will perform sit to stand in preparation for functional mobility with assistance level: Supervision/Verbal cueing   Problem: RH Bed Mobility Goal: LTG Patient will perform bed mobility with assist (PT) Description: LTG: Patient will perform bed mobility with assistance, with/without cues (PT). Flowsheets (Taken 06/08/2024 1535) LTG: Pt will perform bed mobility with assistance level of: Supervision/Verbal cueing   Problem: RH Bed to Chair Transfers Goal: LTG Patient will perform bed/chair transfers w/assist (PT) Description: LTG: Patient will perform bed to chair transfers with assistance (PT). Flowsheets (Taken 06/08/2024 1535) LTG: Pt will perform Bed to Chair Transfers with assistance level: Supervision/Verbal cueing   Problem: RH Car Transfers Goal: LTG Patient will perform car transfers with assist (PT) Description: LTG: Patient will perform car transfers with assistance (PT). Flowsheets (Taken 06/08/2024 1535) LTG: Pt will perform car transfers with assist:: Supervision/Verbal cueing   Problem: RH Ambulation Goal: LTG Patient will ambulate in controlled environment (PT) Description: LTG: Patient will ambulate in a controlled environment, # of feet with assistance (PT). Flowsheets (Taken 06/08/2024 1535) LTG: Pt will ambulate in controlled environ  assist needed:: Supervision/Verbal cueing LTG: Ambulation distance in controlled  environment: 100' Goal: LTG Patient will ambulate in home environment (PT) Description: LTG: Patient will ambulate in home environment, # of feet with assistance (PT). Flowsheets (Taken 06/08/2024 1535) LTG: Pt will ambulate in home environ  assist needed:: Supervision/Verbal cueing LTG: Ambulation distance in home environment: 50'   Problem: RH Stairs Goal: LTG Patient will ambulate up and down stairs w/assist (PT) Description: LTG: Patient will ambulate up and down # of stairs with assistance (PT) Flowsheets (Taken 06/08/2024 1535) LTG: Pt will ambulate up/down stairs assist needed:: Contact Guard/Touching assist LTG: Pt will  ambulate up and down number of stairs: 14 steps with BHRs per home set up

## 2024-06-09 DIAGNOSIS — K5900 Constipation, unspecified: Secondary | ICD-10-CM

## 2024-06-09 DIAGNOSIS — I10 Essential (primary) hypertension: Secondary | ICD-10-CM | POA: Diagnosis not present

## 2024-06-09 DIAGNOSIS — R5381 Other malaise: Secondary | ICD-10-CM | POA: Diagnosis not present

## 2024-06-09 DIAGNOSIS — E1122 Type 2 diabetes mellitus with diabetic chronic kidney disease: Secondary | ICD-10-CM | POA: Diagnosis not present

## 2024-06-09 DIAGNOSIS — Z794 Long term (current) use of insulin: Secondary | ICD-10-CM | POA: Diagnosis not present

## 2024-06-09 DIAGNOSIS — D638 Anemia in other chronic diseases classified elsewhere: Secondary | ICD-10-CM | POA: Diagnosis not present

## 2024-06-09 LAB — GLUCOSE, CAPILLARY
Glucose-Capillary: 109 mg/dL — ABNORMAL HIGH (ref 70–99)
Glucose-Capillary: 119 mg/dL — ABNORMAL HIGH (ref 70–99)
Glucose-Capillary: 150 mg/dL — ABNORMAL HIGH (ref 70–99)
Glucose-Capillary: 148 mg/dL — ABNORMAL HIGH (ref 70–99)

## 2024-06-09 MED ORDER — LACTULOSE 10 GM/15ML PO SOLN
30.0000 g | Freq: Every day | ORAL | Status: DC | PRN
  Administered 2024-06-16: 30 g via ORAL
  Filled 2024-06-09: qty 45

## 2024-06-09 MED ORDER — NEBIVOLOL HCL 5 MG PO TABS
5.0000 mg | ORAL_TABLET | Freq: Every evening | ORAL | Status: DC
  Filled 2024-06-09: qty 1

## 2024-06-09 MED ORDER — AMLODIPINE BESYLATE 10 MG PO TABS
10.0000 mg | ORAL_TABLET | Freq: Every evening | ORAL | Status: DC
  Administered 2024-06-09 – 2024-06-15 (×10): 10 mg via ORAL
  Filled 2024-06-09 (×7): qty 1

## 2024-06-09 MED ORDER — ENOXAPARIN SODIUM 60 MG/0.6ML IJ SOSY
50.0000 mg | PREFILLED_SYRINGE | INTRAMUSCULAR | Status: DC
  Administered 2024-06-09 – 2024-06-12 (×7): 50 mg via SUBCUTANEOUS
  Filled 2024-06-09 (×4): qty 0.6

## 2024-06-09 NOTE — Progress Notes (Signed)
 PROGRESS NOTE   Subjective/Complaints:  Patient reports she feels well today overall.  No new complaints or concerns.  Discussed with daughter BP medications, patient was previously on nebivolol  at night and also taking amlodipine  at night instead of in the morning.  She was told to by PCP to take the amlodipine  at night.  ROS: Patient denies Chills, dizziness, nausea, vomiting, diarrhea,  constipation, shortness of breath or chest pain, headache, or mood change.   Objective:   No results found. Recent Labs    06/08/24 0506 06/08/24 2038  WBC 6.4  --   HGB 8.4* 8.8*  HCT 25.9* 26.7*  PLT 171  --    Recent Labs    06/08/24 0506  NA 138  K 3.7  CL 110  CO2 21*  GLUCOSE 124*  BUN 16  CREATININE 1.39*  CALCIUM  7.8*    Intake/Output Summary (Last 24 hours) at 06/09/2024 1717 Last data filed at 06/09/2024 1539 Gross per 24 hour  Intake 436 ml  Output --  Net 436 ml        Physical Exam: Vital Signs Blood pressure (!) 158/60, pulse 61, temperature 99.1 F (37.3 C), resp. rate 18, height 5' 7 (1.702 m), weight 103.4 kg, SpO2 98%.  Constitutional: No apparent distress. Appropriate appearance for age.  Eating lunch HENT: [place]/AT Eyes: PERRLA. EOMI. Visual fields grossly intact.  Cardiovascular: RRR, no significant edema Respiratory: CTAB, nonlabored breathing Abdomen: + bowel sounds, normoactive. No distention or tenderness.  GU: Not examined.  Skin: C/D/I. No apparent lesions.  MSK:      No apparent deformity.       Neurologic exam:  Cognition: AAO to person, place, time and event.+ Mild delay in recall.  Language: Fluent, No substitutions or neoglisms. No dysarthria. Insight: Good  insight into current condition.  Mood: Very pleasant Sensation: To light touch intact in BL UEs and LEs  CN: 2-12 grossly intact.  Coordination: Bilateral lower extremity tremors with exertion      Strength: Bilateral upper  extremities 4/5 proximally, 5/5 distally      Bilateral lower extremities 3-/5 proximally, 4/5 distally   Prior neuro assessment is c/w today's exam 06/09/2024.   Assessment/Plan: 1. Functional deficits which require 3+ hours per day of interdisciplinary therapy in a comprehensive inpatient rehab setting. Physiatrist is providing close team supervision and 24 hour management of active medical problems listed below. Physiatrist and rehab team continue to assess barriers to discharge/monitor patient progress toward functional and medical goals  Care Tool:  Bathing    Body parts bathed by patient: Right arm, Left arm, Chest, Abdomen, Right upper leg, Left upper leg, Right lower leg, Left lower leg, Face     Body parts n/a: Buttocks, Front perineal area, Right lower leg, Left lower leg   Bathing assist Assist Level: Moderate Assistance - Patient 50 - 74%     Upper Body Dressing/Undressing Upper body dressing   What is the patient wearing?: Pull over shirt    Upper body assist Assist Level: Minimal Assistance - Patient > 75%    Lower Body Dressing/Undressing Lower body dressing      What is the patient wearing?: Underwear/pull up  Lower body assist Assist for lower body dressing: Maximal Assistance - Patient 25 - 49%     Toileting Toileting    Toileting assist Assist for toileting: Maximal Assistance - Patient 25 - 49%     Transfers Chair/bed transfer  Transfers assist     Chair/bed transfer assist level: Moderate Assistance - Patient 50 - 74%     Locomotion Ambulation   Ambulation assist      Assist level: 2 helpers (minA and +2 WC follow for safety) Assistive device: Walker-rolling Max distance: 40'   Walk 10 feet activity   Assist     Assist level: 2 helpers (minA and +2 WC follow for safety) Assistive device: Walker-rolling   Walk 50 feet activity   Assist Walk 50 feet with 2 turns activity did not occur: Safety/medical concerns          Walk 150 feet activity   Assist Walk 150 feet activity did not occur: Safety/medical concerns         Walk 10 feet on uneven surface  activity   Assist Walk 10 feet on uneven surfaces activity did not occur: Safety/medical concerns         Wheelchair     Assist Is the patient using a wheelchair?: Yes Type of Wheelchair: Manual    Wheelchair assist level: Dependent - Patient 0%      Wheelchair 50 feet with 2 turns activity    Assist        Assist Level: Dependent - Patient 0%   Wheelchair 150 feet activity     Assist      Assist Level: Dependent - Patient 0%   Blood pressure (!) 158/60, pulse 61, temperature 99.1 F (37.3 C), resp. rate 18, height 5' 7 (1.702 m), weight 103.4 kg, SpO2 98%.  Medical Problem List and Plan: 1. Functional deficits secondary to debility s/p UTI             -patient may shower             -ELOS/Goals: 21-24 days, Min A PT/OT             -Continue CIR   2.  Antithrombotics: -DVT/anticoagulation:  Pharmaceutical: Lovenox              -antiplatelet therapy: N/A 3. Pain Management: Tylenol  prn.  4. Mood/Behavior/Sleep: LCSW to follow for evaluation and support.              -antipsychotic agents: N/A 5. Neuropsych/cognition: This patient is not fully capable of making decisions on her own behalf. 6. Skin/Wound Care: Routine pressure relief meausre.  7. Fluids/Electrolytes/Nutrition: Monitor I/O. Check CMET in am.  8.HTN: Monitor BP TID- continue amlodipine  and hydralazine .     06/09/2024    3:46 PM 06/09/2024    5:25 AM 06/09/2024    4:45 AM  Vitals with BMI  Weight  227 lbs 15 oz   BMI  35.69   Systolic 158  145  Diastolic 60  58  Pulse 61  56  8/9 increase hydralazine  to 37.5mg  BID 8/10 reports patient had been advised to take amlodipine  at night, medication timing adjusted.  She was also taking nebivolol .  Will hold off on adding this today as we are changing amlodipine  timing.  If BP still elevated  tomorrow consider adding low-dose Nebivolol   9. Anemia of chronic disease: Negative work up by Dr. Freddie and Dr Lanny last month.     - Appears to be gradually trending down on  review on records. Check FOBT  -8/10 CBC tomorrow, FOBT 10. T2DM: Diet controlled- Monitor BS ac/hs.  8/10 fair control overall, continue to monitor CBG (last 3)  Recent Labs    06/09/24 0555 06/09/24 1124 06/09/24 1649  GLUCAP 109* 119* 150*    11. H/o UTIs/Urinary retention: Question due to Merbetriq--d/c a couple of days ago? Foley replaced. Will remove and start bladder program             -monitor voiding with PVR/bladder scan.  12. Dementia: Used to be on On Galantamine  and now on Donepezil .  13. Constipation: Had enema yesterday after no BM X one week  -8/9 LBM today  - 8/10 lactulose  changed from daily to as needed, had a large bowel movement yesterday 14.  CKD 4?3b?: Monitor with serial checks as well as UOP/voiding   - 8/9 creatinine 1.39, overall stable continue to monitor  Recheck labs tomorrow    LOS: 2 days A FACE TO FACE EVALUATION WAS PERFORMED  Murray Collier 06/09/2024, 5:17 PM

## 2024-06-10 DIAGNOSIS — E119 Type 2 diabetes mellitus without complications: Secondary | ICD-10-CM

## 2024-06-10 DIAGNOSIS — N1832 Chronic kidney disease, stage 3b: Secondary | ICD-10-CM | POA: Diagnosis not present

## 2024-06-10 DIAGNOSIS — I10 Essential (primary) hypertension: Secondary | ICD-10-CM | POA: Diagnosis not present

## 2024-06-10 DIAGNOSIS — Z794 Long term (current) use of insulin: Secondary | ICD-10-CM | POA: Diagnosis not present

## 2024-06-10 DIAGNOSIS — R5381 Other malaise: Secondary | ICD-10-CM | POA: Diagnosis not present

## 2024-06-10 DIAGNOSIS — K5901 Slow transit constipation: Secondary | ICD-10-CM | POA: Diagnosis not present

## 2024-06-10 LAB — BASIC METABOLIC PANEL WITH GFR
Anion gap: 8 (ref 5–15)
BUN: 20 mg/dL (ref 8–23)
CO2: 23 mmol/L (ref 22–32)
Calcium: 8.1 mg/dL — ABNORMAL LOW (ref 8.9–10.3)
Chloride: 106 mmol/L (ref 98–111)
Creatinine, Ser: 1.52 mg/dL — ABNORMAL HIGH (ref 0.44–1.00)
GFR, Estimated: 33 mL/min — ABNORMAL LOW (ref >60)
Glucose, Bld: 132 mg/dL — ABNORMAL HIGH (ref 70–99)
Potassium: 4 mmol/L (ref 3.5–5.1)
Sodium: 137 mmol/L (ref 135–145)

## 2024-06-10 LAB — GLUCOSE, CAPILLARY
Glucose-Capillary: 132 mg/dL — ABNORMAL HIGH (ref 70–99)
Glucose-Capillary: 156 mg/dL — ABNORMAL HIGH (ref 70–99)
Glucose-Capillary: 162 mg/dL — ABNORMAL HIGH (ref 70–99)
Glucose-Capillary: 185 mg/dL — ABNORMAL HIGH (ref 70–99)

## 2024-06-10 LAB — CBC
HCT: 26.6 % — ABNORMAL LOW (ref 36.0–46.0)
Hemoglobin: 8.7 g/dL — ABNORMAL LOW (ref 12.0–15.0)
MCH: 28.8 pg (ref 26.0–34.0)
MCHC: 32.7 g/dL (ref 30.0–36.0)
MCV: 88.1 fL (ref 80.0–100.0)
Platelets: 196 K/uL (ref 150–400)
RBC: 3.02 MIL/uL — ABNORMAL LOW (ref 3.87–5.11)
RDW: 14.4 % (ref 11.5–15.5)
WBC: 7.1 K/uL (ref 4.0–10.5)
nRBC: 0 % (ref 0.0–0.2)

## 2024-06-10 NOTE — Progress Notes (Signed)
 PROGRESS NOTE   Subjective/Complaints:  Pt in bathroom. Daughter at bedside. No issues over weekend. Stools are more formed. Denies pain. Ready to get working on stairs today.   ROS: Patient denies fever, rash, sore throat, blurred vision, dizziness, nausea, vomiting, diarrhea, cough, shortness of breath or chest pain, joint or back/neck pain, headache, or mood change.    Objective:   No results found. Recent Labs    06/08/24 0506 06/08/24 2038 06/10/24 0542  WBC 6.4  --  7.1  HGB 8.4* 8.8* 8.7*  HCT 25.9* 26.7* 26.6*  PLT 171  --  196   Recent Labs    06/08/24 0506 06/10/24 0542  NA 138 137  K 3.7 4.0  CL 110 106  CO2 21* 23  GLUCOSE 124* 132*  BUN 16 20  CREATININE 1.39* 1.52*  CALCIUM  7.8* 8.1*    Intake/Output Summary (Last 24 hours) at 06/10/2024 0958 Last data filed at 06/10/2024 0815 Gross per 24 hour  Intake 594 ml  Output --  Net 594 ml        Physical Exam: Vital Signs Blood pressure 119/74, pulse 77, temperature 98.1 F (36.7 C), temperature source Oral, resp. rate 16, height 5' 7 (1.702 m), weight 103.3 kg, SpO2 98%.  General: No acute distress HEENT: NCAT, EOMI, oral membranes moist Cards: reg rate  Chest: normal effort Abdomen: Soft, NT, ND Skin: dry, intact Extremities: no edema Psych: pleasant and appropriate  Skin: C/D/I. No apparent lesions.  MSK:      No apparent deformity.       Neurologic exam:  Cognition: AAO to person, place, time and event.+ Mild delay in recall.  Language: Fluent, No substitutions or neoglisms. No dysarthria. Insight: Good  insight into current condition.  Mood: Very pleasant Sensation: To light touch intact in BL UEs and LEs  CN: 2-12 grossly intact.  Coordination: Bilateral lower extremity tremors with exertion      Strength: Bilateral upper extremities 4/5 proximally, 5/5 distally      Bilateral lower extremities 3-/5 proximally, 4/5  distally Neuro exam appears grossly stable.   Assessment/Plan: 1. Functional deficits which require 3+ hours per day of interdisciplinary therapy in a comprehensive inpatient rehab setting. Physiatrist is providing close team supervision and 24 hour management of active medical problems listed below. Physiatrist and rehab team continue to assess barriers to discharge/monitor patient progress toward functional and medical goals  Care Tool:  Bathing    Body parts bathed by patient: Right arm, Left arm, Chest, Abdomen, Right upper leg, Left upper leg, Right lower leg, Left lower leg, Face     Body parts n/a: Buttocks, Front perineal area, Right lower leg, Left lower leg   Bathing assist Assist Level: Moderate Assistance - Patient 50 - 74%     Upper Body Dressing/Undressing Upper body dressing   What is the patient wearing?: Pull over shirt    Upper body assist Assist Level: Minimal Assistance - Patient > 75%    Lower Body Dressing/Undressing Lower body dressing      What is the patient wearing?: Underwear/pull up     Lower body assist Assist for lower body dressing: Maximal Assistance - Patient  25 - 49%     Toileting Toileting    Toileting assist Assist for toileting: Maximal Assistance - Patient 25 - 49%     Transfers Chair/bed transfer  Transfers assist  Chair/bed transfer activity did not occur: Safety/medical concerns  Chair/bed transfer assist level: Minimal Assistance - Patient > 75%     Locomotion Ambulation   Ambulation assist      Assist level: Minimal Assistance - Patient > 75% Assistive device: Walker-rolling Max distance: 80'   Walk 10 feet activity   Assist     Assist level: Minimal Assistance - Patient > 75% Assistive device: Walker-rolling   Walk 50 feet activity   Assist Walk 50 feet with 2 turns activity did not occur: Safety/medical concerns  Assist level: Minimal Assistance - Patient > 75% Assistive device: Walker-rolling     Walk 150 feet activity   Assist Walk 150 feet activity did not occur: Safety/medical concerns (fatigue)         Walk 10 feet on uneven surface  activity   Assist Walk 10 feet on uneven surfaces activity did not occur: Safety/medical concerns         Wheelchair     Assist Is the patient using a wheelchair?: Yes Type of Wheelchair: Manual    Wheelchair assist level: Dependent - Patient 0%      Wheelchair 50 feet with 2 turns activity    Assist        Assist Level: Dependent - Patient 0%   Wheelchair 150 feet activity     Assist      Assist Level: Dependent - Patient 0%   Blood pressure 119/74, pulse 77, temperature 98.1 F (36.7 C), temperature source Oral, resp. rate 16, height 5' 7 (1.702 m), weight 103.3 kg, SpO2 98%.  Medical Problem List and Plan: 1. Functional deficits secondary to debility s/p UTI             -patient may shower             -ELOS/Goals: 21-24 days, Min A PT/OT             -Continue CIR therapies including PT, OT    2.  Antithrombotics: -DVT/anticoagulation:  Pharmaceutical: Lovenox              -antiplatelet therapy: N/A 3. Pain Management: Tylenol  prn.  4. Mood/Behavior/Sleep: LCSW to follow for evaluation and support.              -antipsychotic agents: N/A 5. Neuropsych/cognition: This patient is not fully capable of making decisions on her own behalf. 6. Skin/Wound Care: Routine pressure relief meausre.  7. Fluids/Electrolytes/Nutrition: Monitor I/O. Check CMET in am.  8.HTN: Monitor BP TID- continue amlodipine  and hydralazine .     06/10/2024    5:23 AM 06/10/2024    5:04 AM 06/09/2024    8:22 PM  Vitals with BMI  Weight  227 lbs 12 oz   BMI  35.66   Systolic 119  157  Diastolic 74  58  Pulse 77  62  8/9 increase hydralazine  to 37.5mg  BID 8/10 reports patient had been advised to take amlodipine  at night, medication timing adjusted.  She was also taking nebivolol .  Will hold off on adding this today as we  are changing amlodipine  timing.  If BP still elevated tomorrow consider adding low-dose Nebivolol  8/11- bp control appears adequate at present.  9. Anemia of chronic disease: Negative work up by Dr. Freddie and Dr Lanny last month.     -  Appears to be gradually trending down on review on records. Check FOBT  -8/11 Hgb appears to be holding steady.  10. T2DM: Diet controlled- Monitor BS ac/hs.  8/11 fair control overall, continue to monitor CBG (last 3)  Recent Labs    06/09/24 1649 06/09/24 2031 06/10/24 0535  GLUCAP 150* 148* 132*    11. H/o UTIs/Urinary retention: Question due to Merbetriq--d/c a couple of days ago? Foley replaced. Will remove and start bladder program             -monitor voiding with PVR/bladder scan.  12. Dementia: Used to be on On Galantamine  and now on Donepezil .  13. Constipation: Had enema yesterday after no BM X one week  - 8/10 lactulose  changed from daily to as needed, had a large bowel movement yesterday  8/11 last bm today. Pt/daughter satisfied with current regimen  14.  CKD 4?3b?: Monitor with serial checks as well as UOP/voiding   - 8/9 creatinine 1.39, overall stable continue to monitor   8/11-a slight bump in BUN/Cr today (20/1.52).  I'm not too concerned at present. WIll encourage PO and follow up lab work on Wednesday.     LOS: 3 days A FACE TO FACE EVALUATION WAS PERFORMED  [name] Mathews 06/10/2024, 9:58 AM

## 2024-06-10 NOTE — Progress Notes (Signed)
 Inpatient Rehabilitation Center Individual Statement of Services  Patient Name:  [name] Mathews  Date:  06/10/2024  Welcome to the Inpatient Rehabilitation Center.  Our goal is to provide you with an individualized program based on your diagnosis and situation, designed to meet your specific needs.  With this comprehensive rehabilitation program, you will be expected to participate in at least 3 hours of rehabilitation therapies Monday-Friday, with modified therapy programming on the weekends.  Your rehabilitation program will include the following services:  Physical Therapy (PT), Occupational Therapy (OT), Speech Therapy (ST), 24 hour per day rehabilitation nursing, Therapeutic Recreaction (TR), Care Coordinator, Rehabilitation Medicine, Nutrition Services, and Pharmacy Services  Weekly team conferences will be held on Wednesday to discuss your progress.  Your Inpatient Rehabilitation Care Coordinator will talk with you frequently to get your input and to update you on team discussions.  Team conferences with you and your family in attendance may also be held.  Expected length of stay: 18-22 days  Overall anticipated outcome: supervision level  Depending on your progress and recovery, your program may change. Your Inpatient Rehabilitation Care Coordinator will coordinate services and will keep you informed of any changes. Your Inpatient Rehabilitation Care Coordinator's name and contact numbers are listed  below.  The following services may also be recommended but are not provided by the Inpatient Rehabilitation Center:   Home Health Rehabiltiation Services Outpatient Rehabilitation Services    Arrangements will be made to provide these services after discharge if needed.  Arrangements include referral to agencies that provide these services.  Your insurance has been verified to be:  Event organiser and Medicaid Your primary doctor is:  Oneil Neth  Pertinent information will be shared  with your doctor and your insurance company.  Inpatient Rehabilitation Care Coordinator:  Rhoda Clement, KEN [phone] or ELIGAH BASQUES  Information discussed with and copy given to patient by: Clement Asberry MATSU, 06/10/2024, 8:27 AM

## 2024-06-10 NOTE — IPOC Note (Signed)
 Overall Plan of Care [facility]) Patient Details Name: [name] Mathews MRN: [id number] DOB: 1934-04-16  Admitting Diagnosis: Debility  Hospital Problems: Principal Problem:   Debility     Functional Problem List: Nursing Bladder, Bowel, Edema, Endurance, Medication Management, Motor, Nutrition, Pain, Perception, Safety, Sensory, Behavior  PT Balance, Behavior, Endurance, Motor, Pain, Perception, Safety, Sensory  OT Balance, Safety, Cognition, Endurance, Nutrition  SLP  (n/a - skilled ST not warranted)  TR         Basic ADL's: OT Grooming, Bathing, Dressing, Toileting     Advanced  ADL's: OT       Transfers: PT Bed Mobility, Bed to Chair, Car, Occupational psychologist, Other (comment) (walk in shower)     Locomotion: PT Ambulation, Stairs     Additional Impairments: OT Fuctional Use of Upper Extremity  SLP None      TR      Anticipated Outcomes Item Anticipated Outcome  Self Feeding mod I  Swallowing      Basic self-care  CGA  Toileting  CGA   Bathroom Transfers CGA  Bowel/Bladder  manage bowels with medications/ manage bladder with toileting assistance  Transfers  supervision  Locomotion  supervision ambulatory  Communication     Cognition     Pain  <4 w/ prns  Safety/Judgment  manage safety with minimal assistance   Therapy Plan: PT Intensity: Minimum of 1-2 x/day ,45 to 90 minutes PT Frequency: 5 out of 7 days PT Duration Estimated Length of Stay: 2.5-3 weeks OT Intensity: Minimum of 1-2 x/day, 45 to 90 minutes OT Frequency: 5 out of 7 days OT Duration/Estimated Length of Stay: 18-22 SLP Intensity:  (n/a - skilled ST not warranted) SLP Frequency:  (n/a - skilled ST not warranted) SLP Duration/Estimated Length of Stay: n/a - skilled ST not warranted   Team Interventions: Nursing Interventions Patient/Family Education, Medication Management, Bladder Management, Bowel Management, Disease Management/Prevention, Pain Management, Discharge Planning,  Cognitive Remediation/Compensation  PT interventions Ambulation/gait training, Discharge planning, Functional mobility training, Psychosocial support, Therapeutic Activities, Balance/vestibular training, Disease management/prevention, Neuromuscular re-education, Therapeutic Exercise, Cognitive remediation/compensation, DME/adaptive equipment instruction, Pain management, Splinting/orthotics, UE/LE Strength taining/ROM, Community reintegration, Development worker, international aid stimulation, Patient/family education, Museum/gallery curator, UE/LE Coordination activities  OT Interventions Warden/ranger, Discharge planning, Self Care/advanced ADL retraining, Therapeutic Activities, UE/LE Coordination activities, Wheelchair propulsion/positioning, UE/LE Strength taining/ROM, Neuromuscular re-education, DME/adaptive equipment instruction, Community reintegration, Psychosocial support, Cognitive remediation/compensation, Disease mangement/prevention, Functional mobility training, Patient/family education, Skin care/wound managment, Therapeutic Exercise, Visual/perceptual remediation/compensation  SLP Interventions  (n/a - skilled ST not warranted)  TR Interventions    SW/CM Interventions Discharge Planning, Psychosocial Support, Patient/Family Education   Barriers to Discharge MD  Medical stability  Nursing Decreased caregiver support, Home environment access/layout, Incontinence Discharge: House  Discharge Home Layout: Able to live on main level with bedroom/bathroom  Discharge Home Access: Stairs to enter  Entrance Stairs-Rails: Left  Entrance Stairs-Number of Steps: 4  PT Inaccessible home environment, Decreased caregiver support, Home environment access/layout 2 STE and 14 steps to main bed and bath, requires more assist than pt daughter able ot provide at this time  OT Home environment access/layout stairs to bedroom/ bathroom  SLP  (n/a - skilled ST not warranted)    SW       Team Discharge  Planning: Destination: PT-Home ,OT- Home , SLP-Home Projected Follow-up: PT-Home health PT, OT-  24 hour supervision/assistance, Home health OT, SLP-None, 24 hour supervision/assistance Projected Equipment Needs: PT-To be determined, OT- 3 in 1 bedside comode, SLP-None  recommended by SLP Equipment Details: PT- , OT-patient has all AE Patient/family involved in discharge planning: PT- Patient, Family member/caregiver,  OT-Patient, Family member/caregiver, SLP-Patient, Family member/caregiver  MD ELOS: 18-20 days Medical Rehab Prognosis:  Excellent Assessment: The patient has been admitted for CIR therapies with the diagnosis of debility after UTI. The team will be addressing functional mobility, strength, stamina, balance, safety, adaptive techniques and equipment, self-care, bowel and bladder mgt, patient and caregiver education, community reentry, pain control. Goals have been set at supervision to contact guard assistance with mobility and ADL's. Pt needs to do some stairs at home as well. Anticipated discharge destination is home with family.        See Team Conference Notes for weekly updates to the plan of care

## 2024-06-10 NOTE — Progress Notes (Signed)
 Inpatient Rehabilitation  Patient information reviewed and entered into eRehab system by Burnard Mealing, OTR/L, Rehab Quality Coordinator.   Information including medical coding, functional ability and quality indicators will be reviewed and updated through discharge.

## 2024-06-10 NOTE — Progress Notes (Signed)
 Inpatient Rehabilitation Care Coordinator Assessment and Plan Patient Details  Name: [name] Mathews MRN: [id number] Date of Birth: 1934-02-19  Today's Date: 06/10/2024  Hospital Problems: Principal Problem:   Debility  Past Medical History:  Past Medical History:  Diagnosis Date   Anemia    hx of   Arthritis    Breast lump    hx of   Cataract    left eye   Chronic back pain    Diabetes mellitus    Hyperlipidemia    Hypertension    sees Dr. 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 She has never used smokeless tobacco. She reports that she does not drink alcohol  and does not use drugs.  Family / Support Systems Marital Status: Widow/Widower Patient Roles: Parent, Other (Comment) Database administrator) Children: [name] Mathews-daughter [phone]  [name] Mathews-daughter [phone] [place] Other Supports: Aide/companion from 9-4 pm daily Anticipated Caregiver: [name] Mathews pt lives with full time at times she does go to [employer] to her other daughter's home Ability/Limitations of Caregiver: None Caregiver Availability: 24/7 Family Dynamics: Close knit with family and extended family. Daughter's make sure Mom has her needs met and are very involved and supportive.  Social History Preferred language: English Religion: Baptist Cultural Background: NA Education: HS Health Literacy - How often do you  need to have someone help you when you read instructions, pamphlets, or other written material from your doctor or pharmacy?: Never Writes: Yes Employment Status: Retired Marine scientist Issues: NA Guardian/Conservator: [name] Mathews is POA/HCPOA. According to MD pt is not fully capable of making her own decisions will look toward daughter is any decisions needs to be made while here   Abuse/Neglect Abuse/Neglect Assessment Can Be Completed: Yes Physical Abuse: Denies Verbal Abuse: Denies Sexual Abuse: Denies Exploitation of patient/patient's resources: Denies Self-Neglect: Denies  Patient response to: Social Isolation - How often do you feel lonely or isolated from those around you?: Rarely  Emotional Status Pt's affect, behavior and adjustment status: Pt is motivated to do well and get stronger here she would like to recover and be back to her prior level of function. Daughter is present in her room and very involved and supportive of mom. Pt is willing to do wahtever is asked of her but is tired from her therapies this am. Recent Psychosocial Issues: other health issues Psychiatric History: No issues seems to be coping appropriately and motivated to be here on rehab, since she is familar with this unit has been here before Substance Abuse History: NA  Patient / Family Perceptions, Expectations & Goals Pt/Family understanding of illness & functional limitations: Pt and daughter can explain her UTI and issues a result of this. Both talk with the MD's involved and feel she is making progress already and hopeful this will continue. According  to her goals she should do well here Premorbid pt/family roles/activities: Mom, grandmother, retiree, church member, etc Anticipated changes in roles/activities/participation: resume Pt/family expectations/goals: Pt states:  I want to do well here.  Daughter states:  I hope she will continue to do well and recover from her UTI.  Community  Resources [employer]: Other (Comment) (has adie 9-4 pm each day) Premorbid Home Care/DME Agencies: Other (Comment) (bsc, rollator) Transportation available at discharge: daughter's Is the patient able to respond to transportation needs?: Yes In the past 12 months, has lack of transportation kept you from medical appointments or from getting medications?: No In the past 12 months, has lack of transportation kept you from meetings, work, or from getting things needed for daily living?: No  Discharge Planning Living Arrangements: Children Support Systems: Children, Other relatives, Church/faith community Type of Residence: Private residence Insurance Resources: [employer] Financial Resources: Restaurant manager, fast food Screen Referred: No Living Expenses: Lives with family Money Management: Family Does the patient have any problems obtaining your medications?: No Home Management: family Patient/Family Preliminary Plans: Return home with daughter and companion who will be there with here and assist if needed. Pt does have 24/7 care and was doing well prior to her UTI and complications from this. She is familar with CIR has been here two other times. Aware team conference on Wed will update then with target DC date.  Clinical Impression Pleasant female who is motivated to do well and recover from her UTI and weakness from this. Her daughter-[name] Mathews is here and very supportive of Mom. Will work on discharge needs and update Wednesday after team conference.  [name] Mathews [name] Mathews 06/10/2024, 10:24 AM

## 2024-06-10 NOTE — Progress Notes (Signed)
 Physical Therapy Session Note  Patient Details  Name: [name] Mathews MRN: [id number] Date of Birth: 1933/11/25  Today's Date: 06/10/2024 PT Individual Time: 8699-8587 PT Individual Time Calculation (min): 72 min   Short Term Goals: Week 1:  PT Short Term Goal 1 (Week 1): Pt will complete bed mobility with min assist PT Short Term Goal 2 (Week 1): Pt will complete sit to stand with min assist consistently with LRAD PT Short Term Goal 3 (Week 1): Pt will ambulate 70' with min assist and LRAD PT Short Term Goal 4 (Week 1): Pt will complete up/down one 3 step with BHRs min assist  Skilled Therapeutic Interventions/Progress Updates:      Treatment Session 1  Pt seated in recliner eating lunch upon arrival. Pt missed 15 minutes 2/2 eating lunch. Pt agreeable to therapy.   Pt performed sit to stand from relciner with RW and min A and walked ~10 feet to perform ambulatory transfer to [facility] with RW and min A, verbal cues provided for safety/proximity with RW and upright posture.   Pt transported dependent in [facility] to main gym for time/energy conservation.   Pt hesitant to work on IT consultant with education and encouragement from PT.   Pt performed toe taps on 3 inch step 1x5 B with B HR and CGA, verbal and tactile cues provided for upright posutre.   Pt performed step ups on 3 inch step with B HR 1x5 B with CGA/min A, verbal/tactile cues provided for upright posture. Pt required seated rest breaks between sets for fatigue.   Pt requesting to use the bathroom. Pt ambulated 10 feet with RW and CGA and performed ambulatory transfer to [facility] over toilet with RW and CGA, verbal cues provided for LE and UE positioning. Pt required ++ time on BSC. Pt donned/doffed pants with min A while standing with RW and CGA. Pt continent of bladder.   Pt requesting to return to bed. Pt performed ambulatory transfer to bed with RW and min A 2/2 fatigue. Sit to supine with close supervision.   Pt supine in bed at  end of session with all needs within reach and bed alarm on.     Therapy Documentation Precautions:  Precautions Precautions: Fall Recall of Precautions/Restrictions: Intact Restrictions Weight Bearing Restrictions Per Provider Order: No  Therapy/Group: Individual Therapy  [facility] [name], [place], DPT  06/10/2024, 7:56 AM

## 2024-06-10 NOTE — Progress Notes (Signed)
 Occupational Therapy Session Note  Patient Details  Name: [name] Mathews MRN: [id number] Date of Birth: [birth year]  Today's Date: 06/10/2024 OT Individual Time: 8967-8883 OT Individual Time Calculation (min): 44 min    Short Term Goals: Week 1:  OT Short Term Goal 1 (Week 1): patient will maintain balance during ADLs with CGA for LB ADLS OT Short Term Goal 2 (Week 1): patient will complete peri hygiene with min A OT Short Term Goal 3 (Week 1): patient will don socks with min A with AE  Skilled Therapeutic Interventions/Progress Updates:    Pt received in gym from PT. 4/10 pain in her R flank, no request for intervention. She was taken back to her room via w/c for dressing retraining. She was able to don a bra and shirt with just min A to pull down posteriorly and bring the bar around her back enough. She threaded BLE into pants, requiring assist just to pull over grippy socks, then she stood with min A and pulled pants up with min A. Pt was taken via w/c to the therapy gym for time management. She completed brief BUE strengthening circuit with a 3 lb dowel. She completed shoulder press to 100 degrees and then bicep curls, 2x10 repetitions. Pt returned to their room following. Pt was left sitting up in the recliner with all needs met, chair alarm set, and call bell within reach.   Therapy Documentation Precautions:  Precautions Precautions: Fall Recall of Precautions/Restrictions: Intact Restrictions Weight Bearing Restrictions Per Provider Order: No Therapy/Group: Individual Therapy  Nena VEAR Moats 06/10/2024, 6:21 AM

## 2024-06-10 NOTE — Progress Notes (Signed)
 Physical Therapy Session Note  Patient Details  Name: [name] Mathews MRN: [id number] Date of Birth: 11/17/1933  Today's Date: 06/10/2024 PT Individual Time: 1002-1029 PT Individual Time Calculation (min): 27 min   Short Term Goals: Week 1:  PT Short Term Goal 1 (Week 1): Pt will complete bed mobility with min assist PT Short Term Goal 2 (Week 1): Pt will complete sit to stand with min assist consistently with LRAD PT Short Term Goal 3 (Week 1): Pt will ambulate 38' with min assist and LRAD PT Short Term Goal 4 (Week 1): Pt will complete up/down one 3 step with BHRs min assist  Skilled Therapeutic Interventions/Progress Updates:   Received pt semi-reclined in bed asleep with daughter at bedside. Upon wakening, pt agreeable to PT treatment and denied any pain during session but exhausted from previous PT session. Session with emphasis on functional mobility/transfers, generalized strengthening and endurance, and dynamic standing balance/coordination. Pt transferred semi-reclined<>sitting R EOB with HOB elevated and use of bedrails with supervision and increased time/effort. Stood from EOB with RW and min A with cues for hand placement and performed stand<>pivot into WC with RW and min A. Pt removed non-skid socks with min A and increased time using reacher with multple rest breaks due to fatigue. Daughter assisted with donning socks but shoes wouldn't fit due to edema, therefore put non-skid socks back on and transported to main therapy gym in [facility] dependently. Pt performed seated knee extension x15 bilaterally, hip flexion x15 bilaterally, and hip adduction ball squeezes x15. Pt left seated in [facility] - handoff to next OT.   Therapy Documentation Precautions:  Precautions Precautions: Fall Recall of Precautions/Restrictions: Intact Restrictions Weight Bearing Restrictions Per Provider Order: No  Therapy/Group: Individual Therapy Therisa HERO Zaunegger Therisa Stains PT, DPT 06/10/2024, 7:09 AM

## 2024-06-10 NOTE — Progress Notes (Signed)
 Physical Therapy Session Note  Patient Details  Name: [name] Mathews MRN: [id number] Date of Birth: 1934/09/04  Today's Date: 06/10/2024 PT Individual Time: 0800-0900 PT Individual Time Calculation (min): 60 min   Short Term Goals: Week 1:  PT Short Term Goal 1 (Week 1): Pt will complete bed mobility with min assist PT Short Term Goal 2 (Week 1): Pt will complete sit to stand with min assist consistently with LRAD PT Short Term Goal 3 (Week 1): Pt will ambulate 80' with min assist and LRAD PT Short Term Goal 4 (Week 1): Pt will complete up/down one 3 step with BHRs min assist  Skilled Therapeutic Interventions/Progress Updates:      Pt in bed to start with her daughter at the bedside. Daughter very supportive and willing to offer assistance. Pt denies pain, reports generalized discomfort in her lower back, which is chronic. Offered repositioning and rest breaks during session for pain management. Also reached out to MD for possible K-pad in room for pain management.   Supine<>sitting EOB with modA with her daughter providing assistance. Daughter also assisted in changing her brief. She needs modA for sit<>stand due to posterior lean and fear of falling. Able to stand with RUE support on bed rail with minA for balance while donning a 2nd gown for her backside. Completed stand pivot transfer with min/modA from bed to wheelchair and then taken to main gym.  Active warm up: -2x5 sit<>stands with emphasis on forward trunk lean and upright posture in standing. minA with RW support. Extended seated rest break b/w sets.  Pt instructed in Timed up and Go while using the RW: -Completed in 74 seconds. *Scores > 13.5 seconds indciate increased falls risk.  Pt instructed in 10 METER walk test while using the RW: -Completed in 35.5 seconds, with a gait speed of 0.28 m/s.   Pt ambulates with a forward flexed trunk, heavy reliance of RW support for balance and stability while ambulating. Downward  gaze, slow gait speed.   Pt ambulated 2x53ft with CGA and RW with deficits described as above. Seated rest break for recovery and gentle encouragement needed for effort.   Pt reporting urgent need to void - taken back to her room and assisted to the bathroom via ambulatory transfer with CGA/minA and RW - safety cues for managing the threshold and for safety approach to the commode. Pt needing ++ time to void, water turned on to help. Informed her to use pull cord for staff assistance when finished. Daughter and pt voiced understanding.   Therapy Documentation Precautions:  Precautions Precautions: Fall Recall of Precautions/Restrictions: Intact Restrictions Weight Bearing Restrictions Per Provider Order: No General:     Therapy/Group: Individual Therapy  [name] 06/10/2024, 7:52 AM

## 2024-06-10 NOTE — Plan of Care (Signed)
  Problem: Consults Goal: [facility] GENERAL PATIENT EDUCATION Description: See Patient Education module for education specifics. Outcome: Progressing   

## 2024-06-11 DIAGNOSIS — E1122 Type 2 diabetes mellitus with diabetic chronic kidney disease: Secondary | ICD-10-CM | POA: Diagnosis not present

## 2024-06-11 DIAGNOSIS — N189 Chronic kidney disease, unspecified: Secondary | ICD-10-CM

## 2024-06-11 DIAGNOSIS — E66812 Obesity, class 2: Secondary | ICD-10-CM | POA: Diagnosis not present

## 2024-06-11 DIAGNOSIS — Z6835 Body mass index (BMI) 35.0-35.9, adult: Secondary | ICD-10-CM | POA: Diagnosis not present

## 2024-06-11 DIAGNOSIS — I10 Essential (primary) hypertension: Secondary | ICD-10-CM | POA: Diagnosis not present

## 2024-06-11 DIAGNOSIS — R5381 Other malaise: Secondary | ICD-10-CM | POA: Diagnosis not present

## 2024-06-11 DIAGNOSIS — K5900 Constipation, unspecified: Secondary | ICD-10-CM | POA: Diagnosis not present

## 2024-06-11 DIAGNOSIS — Z794 Long term (current) use of insulin: Secondary | ICD-10-CM | POA: Diagnosis not present

## 2024-06-11 LAB — GLUCOSE, CAPILLARY
Glucose-Capillary: 138 mg/dL — ABNORMAL HIGH (ref 70–99)
Glucose-Capillary: 169 mg/dL — ABNORMAL HIGH (ref 70–99)
Glucose-Capillary: 157 mg/dL — ABNORMAL HIGH (ref 70–99)
Glucose-Capillary: 153 mg/dL — ABNORMAL HIGH (ref 70–99)

## 2024-06-11 MED ORDER — NEBIVOLOL HCL 2.5 MG PO TABS
2.5000 mg | ORAL_TABLET | Freq: Every evening | ORAL | Status: DC
  Administered 2024-06-11 (×2): 2.5 mg via ORAL
  Filled 2024-06-11 (×2): qty 1

## 2024-06-11 NOTE — Progress Notes (Addendum)
 PROGRESS NOTE   Subjective/Complaints:  No new concerns or complaints today.  Patient and daughter feel like she has made a lot of progress since being in CIR.  ROS: Patient denies chills, rash, vision changes, nausea, vomiting, diarrhea, shortness of breath, abdominal pain, chest pain  Objective:   No results found. Recent Labs    06/08/24 2038 06/10/24 0542  WBC  --  7.1  HGB 8.8* 8.7*  HCT 26.7* 26.6*  PLT  --  196   Recent Labs    06/10/24 0542  NA 137  K 4.0  CL 106  CO2 23  GLUCOSE 132*  BUN 20  CREATININE 1.52*  CALCIUM  8.1*    Intake/Output Summary (Last 24 hours) at 06/11/2024 0844 Last data filed at 06/11/2024 0800 Gross per 24 hour  Intake 118 ml  Output --  Net 118 ml        Physical Exam: Vital Signs Blood pressure (!) 150/47, pulse 63, temperature 98.4 F (36.9 C), temperature source Oral, resp. rate 18, height 5' 7 (1.702 m), weight 103.3 kg, SpO2 98%.  General: No acute distress HEENT: NCAT, EOMI, oral membranes moist Cards: reg rate  Chest: Clear to auscultation bilaterally, nonlabored breathing abdomen: Soft, NT, ND Skin: Warm and dry, no breakdown noted on visible portion Extremities: no edema Psych: pleasant and appropriate  Skin: C/D/I. No apparent lesions.  MSK:      No apparent deformity.       Neurologic exam:  Cognition: AAO to person, place, time and event.+ Mild delay in recall.  Language: Fluent, No substitutions or neoglisms. No dysarthria. Insight: Good  insight into current condition.  Mood: Very pleasant Sensation: To light touch intact in BL UEs and LEs  CN: 2-12 grossly intact.  Coordination: Bilateral lower extremity tremors with exertion      Strength: Bilateral upper extremities 4/5 proximally, 5/5 distally      Bilateral lower extremities 4-/5 proximally, 4/5 distally Neuro exam appears grossly stable.   Assessment/Plan: 1. Functional deficits which  require 3+ hours per day of interdisciplinary therapy in a comprehensive inpatient rehab setting. Physiatrist is providing close team supervision and 24 hour management of active medical problems listed below. Physiatrist and rehab team continue to assess barriers to discharge/monitor patient progress toward functional and medical goals  Care Tool:  Bathing    Body parts bathed by patient: Right arm, Left arm, Chest, Abdomen, Right upper leg, Left upper leg, Right lower leg, Left lower leg, Face     Body parts n/a: Buttocks, Front perineal area, Right lower leg, Left lower leg   Bathing assist Assist Level: Moderate Assistance - Patient 50 - 74%     Upper Body Dressing/Undressing Upper body dressing   What is the patient wearing?: Pull over shirt    Upper body assist Assist Level: Minimal Assistance - Patient > 75%    Lower Body Dressing/Undressing Lower body dressing      What is the patient wearing?: Underwear/pull up     Lower body assist Assist for lower body dressing: Maximal Assistance - Patient 25 - 49%     Toileting Toileting    Toileting assist Assist for toileting: Maximal  Assistance - Patient 25 - 49%     Transfers Chair/bed transfer  Transfers assist  Chair/bed transfer activity did not occur: Safety/medical concerns  Chair/bed transfer assist level: Minimal Assistance - Patient > 75%     Locomotion Ambulation   Ambulation assist      Assist level: Minimal Assistance - Patient > 75% Assistive device: Walker-rolling Max distance: 80'   Walk 10 feet activity   Assist     Assist level: Minimal Assistance - Patient > 75% Assistive device: Walker-rolling   Walk 50 feet activity   Assist Walk 50 feet with 2 turns activity did not occur: Safety/medical concerns  Assist level: Minimal Assistance - Patient > 75% Assistive device: Walker-rolling    Walk 150 feet activity   Assist Walk 150 feet activity did not occur: Safety/medical  concerns (fatigue)         Walk 10 feet on uneven surface  activity   Assist Walk 10 feet on uneven surfaces activity did not occur: Safety/medical concerns         Wheelchair     Assist Is the patient using a wheelchair?: Yes Type of Wheelchair: Manual    Wheelchair assist level: Dependent - Patient 0%      Wheelchair 50 feet with 2 turns activity    Assist        Assist Level: Dependent - Patient 0%   Wheelchair 150 feet activity     Assist      Assist Level: Dependent - Patient 0%   Blood pressure (!) 150/47, pulse 63, temperature 98.4 F (36.9 C), temperature source Oral, resp. rate 18, height 5' 7 (1.702 m), weight 103.3 kg, SpO2 98%.  Medical Problem List and Plan: 1. Functional deficits secondary to debility s/p UTI             -patient may shower             -ELOS/Goals: 21-24 days, Min A PT/OT             -Continue CIR therapies including PT, OT    - Team conference tomorrow 2.  Antithrombotics: -DVT/anticoagulation:  Pharmaceutical: Lovenox              -antiplatelet therapy: N/A 3. Pain Management: Tylenol  prn.  4. Mood/Behavior/Sleep: LCSW to follow for evaluation and support.              -antipsychotic agents: N/A 5. Neuropsych/cognition: This patient is not fully capable of making decisions on her own behalf. 6. Skin/Wound Care: Routine pressure relief meausre.  7. Fluids/Electrolytes/Nutrition: Monitor I/O. Check CMET in am.  8.HTN: Monitor BP TID- continue amlodipine  and hydralazine .     06/11/2024    5:39 AM 06/10/2024    7:55 PM 06/10/2024   12:17 PM  Vitals with BMI  Systolic 150 144 836  Diastolic 47 52 54  Pulse 63 60 59  8/9 increase hydralazine  to 37.5mg  BID 8/10 reports patient had been advised to take amlodipine  at night, medication timing adjusted.  She was also taking nebivolol .  Will hold off on adding this today as we are changing amlodipine  timing.  If BP still elevated tomorrow consider adding low-dose  Nebivolol  8/11- bp control appears adequate at present.  8/12 will restart Nebivolol  at 2.5 mg  9. Anemia of chronic disease: Negative work up by Dr. Freddie and Dr Lanny last month.     - Appears to be gradually trending down on review on records. Check FOBT  -8/11 Hgb appears to  be holding steady.  10. T2DM: Diet controlled- Monitor BS ac/hs.  8/12 BP control remains fair, continue current regimen for now CBG (last 3)  Recent Labs    06/10/24 1657 06/10/24 2126 06/11/24 0637  GLUCAP 162* 185* 138*    11. H/o UTIs/Urinary retention: Question due to Merbetriq--d/c a couple of days ago? Foley replaced. Will remove and start bladder program             -monitor voiding with PVR/bladder scan.  12. Dementia: Used to be on On Galantamine  and now on Donepezil .  13. Constipation: Had enema yesterday after no BM X one week  - 8/10 lactulose  changed from daily to as needed, had a large bowel movement yesterday  8/11 last bm today. Pt/daughter satisfied with current regimen  14.  CKD 4?3b?: Monitor with serial checks as well as UOP/voiding   - 8/9 creatinine 1.39, overall stable continue to monitor   8/11-a slight bump in BUN/Cr today (20/1.52).  I'm not too concerned at present. WIll encourage PO and follow up lab work on Wednesday.   Recheck tomorrow  15. Obesity. Class 2. Dietary counseling   -Body mass index is 35.67 kg/m.     LOS: 4 days A FACE TO FACE EVALUATION WAS PERFORMED  Murray Collier 06/11/2024, 8:44 AM

## 2024-06-11 NOTE — Progress Notes (Signed)
 Occupational Therapy Session Note  Patient Details  Name: [name] Mathews MRN: [id number] Date of Birth: 06-Oct-1934  Today's Date: 06/11/2024 OT Individual Time: 9183-9150 OT Individual Time Calculation (min): 33 min    Short Term Goals: Week 1:  OT Short Term Goal 1 (Week 1): patient will maintain balance during ADLs with CGA for LB ADLS OT Short Term Goal 2 (Week 1): patient will complete peri hygiene with min A OT Short Term Goal 3 (Week 1): patient will don socks with min A with AE   Skilled Therapeutic Interventions/Progress Updates:    Pt bed level at time of session with daughter present who remained throughout session, no pain. Pt sleepy but easily woken, extended time for all tasks 2/2 slow movements. Supine > sit with CGA, stand pivot bed > wheelchair <> toilet all with MIN A, initially with RW and to toilet with grab bar for urgency. Pt with continent void of bladder, brief dry this date. Standing for pericare, clothing management MIN A. Able to don new pants as well with anterior weight shifting and standing at grab bar to don over hips, MIN for posterior help over buttocks. Set up in wheelchair with daughter present alarm on call bell in reach.   Therapy Documentation Precautions:  Precautions Precautions: Fall Recall of Precautions/Restrictions: Intact Restrictions Weight Bearing Restrictions Per Provider Order: No    Therapy/Group: Individual Therapy  Chiquita JAYSON Hopping 06/11/2024, 7:23 AM

## 2024-06-11 NOTE — Progress Notes (Signed)
 Occupational Therapy Session Note  Patient Details  Name: [name] Mathews MRN: [id number] Date of Birth: [birth year]  Today's Date: 06/11/2024 OT Individual Time: 1332-1446 OT Individual Time Calculation (min): 74 min    Short Term Goals: Week 1:  OT Short Term Goal 1 (Week 1): patient will maintain balance during ADLs with CGA for LB ADLS OT Short Term Goal 2 (Week 1): patient will complete peri hygiene with min A OT Short Term Goal 3 (Week 1): patient will don socks with min A with AE  Skilled Therapeutic Interventions/Progress Updates:  Patient agreeable to participate in OT session. Reports 4/10 pain level, patient declined medication. Reposition and utilized diversion activities. .   Patient participated in skilled OT session focusing on functional mobility and functional activity tolerance related to successful participation in ADL's. Patient completed transfer to wc min to mod A. Patient completed NuStep to increase functional activity tolerance. Able to tolerate 8.5 minutes at 40 spm pacing with several rest breaks. Patient completed functional mobility 110 ft with RW to increase functional mobility, activity tolerance, and standing balance related to Adls such as showering, LB dressing, and toileting. Patient required rest break following. Patient completed sit to stands x4 with min A to increase functional transfers and dynamic balance. Returned to bed alarm on all needs in reach   Therapy Documentation Precautions:  Precautions Precautions: Fall Recall of Precautions/Restrictions: Intact Restrictions Weight Bearing Restrictions Per Provider Order: No  Therapy/Group: Individual Therapy  D'mariea L Jaasiel [place] 06/11/2024, 7:54 AM

## 2024-06-11 NOTE — Progress Notes (Signed)
 Physical Therapy Session Note  Patient Details  Name: [name] Mathews MRN: [id number] Date of Birth: 02/05/1934  Today's Date: 06/11/2024 PT Individual Time: 1105-1200 PT Individual Time Calculation (min): 55 min  PT missed time: 45 min (fatigue, pt unwilling to participate).   Short Term Goals: Week 1:  PT Short Term Goal 1 (Week 1): Pt will complete bed mobility with min assist PT Short Term Goal 2 (Week 1): Pt will complete sit to stand with min assist consistently with LRAD PT Short Term Goal 3 (Week 1): Pt will ambulate 47' with min assist and LRAD PT Short Term Goal 4 (Week 1): Pt will complete up/down one 3 step with BHRs min assist  Skilled Therapeutic Interventions/Progress Updates:     Treatment Session 1  Pt seated in recliner upon arrival. Pt agreeable to therapy. Pt reports 4/10 LBP, , therapist provided rest breaks and repositioning as needed.   Pt performed sit to stand from recliner with RW and heavy mod A, verbal cues provided for UE/LE posiitoning and anteiror weight shift. Pt performed sit to stand from [facility] with RW and CGA/min A (with fatigue), vebral cues provided for B UE positioning on WC arm rests and anterior weight shift.   Pt ambulated with RW and CGA/minA (with fatigue) with use of RW, pt demos heavy trunk flexion but demonstrates improved proximity with RW initiially (however reduced with fatigue), verbal cues provided for safety with RW, upright posture and forward gaze. Pt demonstrates 2 episodes of R LE toe drag with fatigue. PT initiated seated rest break in WC. Education provided for energy conservation and recognizing signs and symptoms of fatigue. Encouraged pt to take seated rest breaks as needed for safety/fatigue to reduce fall risk. Pt verbalzied understanding and agreeable.   Education provided for 5 P's of energy conservation with emphasis on pacing oneself and pursed lip breathing, Handout provided. Pt verbalized understanding and agreeable.   Pt  overall appears to be more sleeping this session.   Pt supine in bed at end of session with all needs within reach and bed alarm on.   Treatment Session 2   Pt supine in bed upon arrival. Pt overall very fatigued from previous OT session. Pt requesting PT to return tomorrow. PT offered bed level exercises. Pt continuing to request pt return tomorrow 2/2 fatigue. Pt missed 45 min, will attempt to make up missed minutes as able.   Therapy Documentation Precautions:  Precautions Precautions: Fall Recall of Precautions/Restrictions: Intact Restrictions Weight Bearing Restrictions Per Provider Order: No  Therapy/Group: Individual Therapy  [facility] [name], [place], DPT  06/11/2024, 7:51 AM

## 2024-06-11 NOTE — Progress Notes (Signed)
 Met with patient and daughter to review current situation, team conference and plan of care. Daughter was very Adult nurse and would bring their book home. Continue to follow along to provide educational needs to facilitate preparation for discharge.

## 2024-06-12 DIAGNOSIS — R5381 Other malaise: Secondary | ICD-10-CM | POA: Diagnosis not present

## 2024-06-12 DIAGNOSIS — I10 Essential (primary) hypertension: Secondary | ICD-10-CM | POA: Diagnosis not present

## 2024-06-12 DIAGNOSIS — E1122 Type 2 diabetes mellitus with diabetic chronic kidney disease: Secondary | ICD-10-CM | POA: Diagnosis not present

## 2024-06-12 DIAGNOSIS — N189 Chronic kidney disease, unspecified: Secondary | ICD-10-CM | POA: Diagnosis not present

## 2024-06-12 DIAGNOSIS — K5900 Constipation, unspecified: Secondary | ICD-10-CM | POA: Diagnosis not present

## 2024-06-12 DIAGNOSIS — R339 Retention of urine, unspecified: Secondary | ICD-10-CM | POA: Diagnosis not present

## 2024-06-12 LAB — GLUCOSE, CAPILLARY
Glucose-Capillary: 122 mg/dL — ABNORMAL HIGH (ref 70–99)
Glucose-Capillary: 166 mg/dL — ABNORMAL HIGH (ref 70–99)
Glucose-Capillary: 184 mg/dL — ABNORMAL HIGH (ref 70–99)
Glucose-Capillary: 128 mg/dL — ABNORMAL HIGH (ref 70–99)

## 2024-06-12 LAB — CBC
HCT: 25.6 % — ABNORMAL LOW (ref 36.0–46.0)
Hemoglobin: 8.4 g/dL — ABNORMAL LOW (ref 12.0–15.0)
MCH: 29.3 pg (ref 26.0–34.0)
MCHC: 32.8 g/dL (ref 30.0–36.0)
MCV: 89.2 fL (ref 80.0–100.0)
Platelets: 217 K/uL (ref 150–400)
RBC: 2.87 MIL/uL — ABNORMAL LOW (ref 3.87–5.11)
RDW: 14.5 % (ref 11.5–15.5)
WBC: 6.4 K/uL (ref 4.0–10.5)
nRBC: 0 % (ref 0.0–0.2)

## 2024-06-12 LAB — BASIC METABOLIC PANEL WITH GFR
Anion gap: 9 (ref 5–15)
BUN: 23 mg/dL (ref 8–23)
CO2: 23 mmol/L (ref 22–32)
Calcium: 8.7 mg/dL — ABNORMAL LOW (ref 8.9–10.3)
Chloride: 107 mmol/L (ref 98–111)
Creatinine, Ser: 1.56 mg/dL — ABNORMAL HIGH (ref 0.44–1.00)
GFR, Estimated: 32 mL/min — ABNORMAL LOW (ref >60)
Glucose, Bld: 131 mg/dL — ABNORMAL HIGH (ref 70–99)
Potassium: 3.8 mmol/L (ref 3.5–5.1)
Sodium: 139 mmol/L (ref 135–145)

## 2024-06-12 MED ORDER — NEBIVOLOL HCL 5 MG PO TABS
5.0000 mg | ORAL_TABLET | Freq: Every evening | ORAL | Status: DC
  Administered 2024-06-12 – 2024-06-16 (×6): 5 mg via ORAL
  Filled 2024-06-12 (×9): qty 1

## 2024-06-12 MED ORDER — POLYETHYLENE GLYCOL 3350 17 G PO PACK
17.0000 g | PACK | Freq: Every day | ORAL | Status: DC | PRN
  Administered 2024-06-16: 17 g via ORAL
  Filled 2024-06-12: qty 1

## 2024-06-12 NOTE — Progress Notes (Signed)
 PROGRESS NOTE   Subjective/Complaints:  No acute events overnight.  Patient having some right hip pain after stairs.  She does report chronic hip pain, aggravated by recent therapy.  She does not want any stronger medications at this time.  We discussed increasing oral fluid intake.  ROS: Patient denies chills, rash, vision changes, nausea, vomiting, diarrhea, shortness of breath, abdominal pain, chest pain +Rt hip pain   Objective:   No results found. Recent Labs    06/10/24 0542 06/12/24 0500  WBC 7.1 6.4  HGB 8.7* 8.4*  HCT 26.6* 25.6*  PLT 196 217   Recent Labs    06/10/24 0542 06/12/24 0500  NA 137 139  K 4.0 3.8  CL 106 107  CO2 23 23  GLUCOSE 132* 131*  BUN 20 23  CREATININE 1.52* 1.56*  CALCIUM  8.1* 8.7*    Intake/Output Summary (Last 24 hours) at 06/12/2024 0846 Last data filed at 06/11/2024 1200 Gross per 24 hour  Intake 75 ml  Output --  Net 75 ml        Physical Exam: Vital Signs Blood pressure (!) 154/57, pulse 67, temperature 98.3 F (36.8 C), temperature source Oral, resp. rate 19, height 5' 7 (1.702 m), weight 103.3 kg, SpO2 97%.  General: No acute distress, sitting in bed working with therapy HEENT: NCAT, EOMI, oral membranes moist Cards: RRR Chest: Clear to auscultation bilaterally, nonlabored breathing  abdomen: Soft, NT, ND, + BS -a little hypoactive Skin: Warm and dry, no breakdown noted on visible portion Extremities: no edema Psych: pleasant and appropriate  Skin: C/D/I. No apparent lesions.  MSK:      No apparent deformity.  Pain with internal and external rotation of right hip.  No significant greater trochanter tenderness noted.       Neurologic exam:  Cognition: AAO x4+ Mild delay in recall.  Language: Fluent Mood: Very pleasant Sensation: To light touch intact in BL UEs and LEs  CN: 2-12 grossly intact.  Coordination: Bilateral lower extremity tremors with  exertion      Strength: Bilateral upper extremities 4/5 proximally, 5/5 distally      Bilateral lower extremities 4-/5 proximally, 4/5 distally Neuro exam appears grossly stable.   Assessment/Plan: 1. Functional deficits which require 3+ hours per day of interdisciplinary therapy in a comprehensive inpatient rehab setting. Physiatrist is providing close team supervision and 24 hour management of active medical problems listed below. Physiatrist and rehab team continue to assess barriers to discharge/monitor patient progress toward functional and medical goals  Care Tool:  Bathing    Body parts bathed by patient: Right arm, Left arm, Chest, Abdomen, Right upper leg, Left upper leg, Right lower leg, Left lower leg, Face     Body parts n/a: Buttocks, Front perineal area, Right lower leg, Left lower leg   Bathing assist Assist Level: Moderate Assistance - Patient 50 - 74%     Upper Body Dressing/Undressing Upper body dressing   What is the patient wearing?: Pull over shirt    Upper body assist Assist Level: Minimal Assistance - Patient > 75%    Lower Body Dressing/Undressing Lower body dressing      What is the patient  wearing?: Underwear/pull up     Lower body assist Assist for lower body dressing: Maximal Assistance - Patient 25 - 49%     Toileting Toileting    Toileting assist Assist for toileting: Moderate Assistance - Patient 50 - 74%     Transfers Chair/bed transfer  Transfers assist  Chair/bed transfer activity did not occur: Safety/medical concerns  Chair/bed transfer assist level: Minimal Assistance - Patient > 75%     Locomotion Ambulation   Ambulation assist      Assist level: Minimal Assistance - Patient > 75% Assistive device: Walker-rolling Max distance: 97'   Walk 10 feet activity   Assist     Assist level: Contact Guard/Touching assist Assistive device: Walker-rolling   Walk 50 feet activity   Assist Walk 50 feet with 2 turns  activity did not occur: Safety/medical concerns  Assist level: Minimal Assistance - Patient > 75% Assistive device: Walker-rolling    Walk 150 feet activity   Assist Walk 150 feet activity did not occur: Safety/medical concerns (fatigue)         Walk 10 feet on uneven surface  activity   Assist Walk 10 feet on uneven surfaces activity did not occur: Safety/medical concerns         Wheelchair     Assist Is the patient using a wheelchair?: Yes Type of Wheelchair: Manual    Wheelchair assist level: Dependent - Patient 0%      Wheelchair 50 feet with 2 turns activity    Assist        Assist Level: Dependent - Patient 0%   Wheelchair 150 feet activity     Assist      Assist Level: Dependent - Patient 0%   Blood pressure (!) 154/57, pulse 67, temperature 98.3 F (36.8 C), temperature source Oral, resp. rate 19, height 5' 7 (1.702 m), weight 103.3 kg, SpO2 97%.  Medical Problem List and Plan: 1. Functional deficits secondary to debility s/p UTI             -patient may shower             -ELOS/Goals: 21-24 days, Min A PT/OT             -Continue CIR therapies including PT, OT    -Team conference today please see physician documentation under team conference tab, met with team  to discuss problems,progress, and goals. Formulized individual treatment plan based on medical history, underlying problem and comorbidities.  2.  Antithrombotics: -DVT/anticoagulation:  Pharmaceutical: Lovenox              -antiplatelet therapy: N/A 3. Pain Management: Tylenol  prn.   -8/13 patient like to avoid stronger pain medications.  Having some right hip pain after stairs.  Will try Tylenol  and heating pad for now. 4. Mood/Behavior/Sleep: LCSW to follow for evaluation and support.              -antipsychotic agents: N/A 5. Neuropsych/cognition: This patient is not fully capable of making decisions on her own behalf. 6. Skin/Wound Care: Routine pressure relief meausre.   7. Fluids/Electrolytes/Nutrition: Monitor I/O. Check CMET in am.  8.HTN: Monitor BP TID- continue amlodipine  and hydralazine .     06/12/2024    5:12 AM 06/11/2024    8:21 PM 06/11/2024    1:33 PM  Vitals with BMI  Systolic 154 170 843  Diastolic 57 53 57  Pulse 67 65 65  8/9 increase hydralazine  to 37.5mg  BID 8/10 reports patient had been advised to take  amlodipine  at night, medication timing adjusted.  She was also taking nebivolol .  Will hold off on adding this today as we are changing amlodipine  timing.  If BP still elevated tomorrow consider adding low-dose Nebivolol  8/11- bp control appears adequate at present.  8/12 will restart Nebivolol  at 2.5 mg  8/13 increase Nebivolol  to 5mg  9. Anemia of chronic disease: Negative work up by Dr. Freddie and Dr Lanny last month.     - Appears to be gradually trending down on review on records. Check FOBT  -8/13 hemoglobin overall stable at 8.4, discussed FOBT nursing team 10. T2DM: Diet controlled- Monitor BS ac/hs.  8/13 controlled overall, continue to monitor CBG (last 3)  Recent Labs    06/11/24 1659 06/11/24 2110 06/12/24 0622  GLUCAP 157* 153* 122*    11. H/o UTIs/Urinary retention: Question due to Merbetriq--d/c a couple of days ago? Foley replaced. Will remove and start bladder program             -monitor voiding with PVR/bladder scan.   -Will ask nursing to get some PVRs 12. Dementia: Used to be on On Galantamine  and now on Donepezil .  13. Constipation: Had enema yesterday after no BM X one week  - 8/10 lactulose  changed from daily to as needed, had a large bowel movement yesterday  8/11 last bm today. Pt/daughter satisfied with current regimen   8/13 LBM documented 6/11, will add prn Miralax  for mild constipation, encourage oral fluid intake 14.  CKD 4?3b?: Monitor with serial checks as well as UOP/voiding   - 8/9 creatinine 1.39, overall stable continue to monitor   8/11-a slight bump in BUN/Cr today (20/1.52).  I'm not  too concerned at present. WIll encourage PO and follow up lab work on Wednesday.   8/13 creatinine slightly higher at 1.56/BUN 23.  Continue encourage oral fluid intake for now 15. Obesity. Class 2. Dietary counseling   -Body mass index is 35.67 kg/m.     LOS: 5 days A FACE TO FACE EVALUATION WAS PERFORMED  Murray Collier 06/12/2024, 8:46 AM

## 2024-06-12 NOTE — Progress Notes (Signed)
 Occupational Therapy Session Note  Patient Details  Name: [name] Mathews MRN: [id number] Date of Birth: 1934/03/19  Session 1: Today's Date: 06/12/2024 OT Individual Time: 1001-1045 OT Individual Time Calculation (min): 44 min   Session 2: Today's Date: 06/12/2024 OT Individual Time: 8553-8458 OT Individual Time Calculation (min): 55 min   Short Term Goals: Week 1:  OT Short Term Goal 1 (Week 1): patient will maintain balance during ADLs with CGA for LB ADLS OT Short Term Goal 2 (Week 1): patient will complete peri hygiene with min A OT Short Term Goal 3 (Week 1): patient will don socks with min A with AE  Session 1: Skilled Therapeutic Interventions/Progress Updates:    Patient agreeable to participate in OT session. Reports 6/10 pain level, reported to RN and MD for medication. Patient received in bed, reporting fatigue from therapy. Patient SUP to EOB with use of rails. Sit to stand x3 with increased hip pain reported. MD assessed hip pain and made recommendations. Patient completed UE strengthening in all planes with Minah Axelrod theraband and increased time due to decreased processing today. Patient able to complete dynamic balance sitting on EOB. Patient returned to supine all needs in reach.   Session 2: Skilled Therapeutic Interventions/Progress Updates:  Patient agreeable to participate in OT session. Reports 5/10 pain level in R hip. Reported to RN.  Completed family training with patients daughter. Therapist educated family regarding showering safety, mobility, and ADL completion.. Education also provided on strategies and compensatory techniques to use if patient were to require additional assistance. Education provided for gait belt use and handling technique. daugher completed hands on training for showering and shower, wc, bed transfer with therapist providing VC as needed for technique and form. All education completed and all questions . Continued family training will be provided  as patient progresses to reflect current level of function.  Therapist facilitated increased mobility, safety, and ADL participation with minimum required assistance in order to improve ability to return home at prior level.    ADLs: UB dressing doff/ don- CG to min LB doff and don- min to mod due to positioning Bathing- MIN A  Shower Transfer- Min a  Therapy Documentation Precautions:  Precautions Precautions: Fall Recall of Precautions/Restrictions: Intact Restrictions Weight Bearing Restrictions Per Provider Order: No  Therapy/Group: Individual Therapy  D'mariea L Rahul Malinak 06/12/2024, 7:49 AM

## 2024-06-12 NOTE — Patient Care Conference (Signed)
 Inpatient RehabilitationTeam Conference and Plan of Care Update Date: 06/12/2024   Time: 1228 pm    Patient Name: [name] Mathews      Medical Record Number: [id number]  Date of Birth: 1934-10-05 Sex: Female         Room/Bed: 4W20C/4W20C-01 Payor Info: Payor: MEDICARE / Plan: MEDICARE PART A AND B / Product Type: *No Product type* /    Admit Date/Time:  06/07/2024 12:57 PM  Primary Diagnosis:  Debility  Hospital Problems: Principal Problem:   Debility    Expected Discharge Date: Expected Discharge Date: 06/27/24  Team Members Present: Physician leading conference: Dr. Murray Collier Social Worker Present: Rhoda Clement, LCSW Nurse Present: Eulalio Falls, RN PT Present: [name], PT OT Present: Other (comment) ANCEL Seip OT) PPS Coordinator present : Eleanor Colon, SLP     Current Status/Progress Goal Weekly Team Focus  Bowel/Bladder   Continent of B/B with intermittent episodes of incontinence.   Regain full function of B/B   Will regain continence with normal pattern    Swallow/Nutrition/ Hydration               ADL's   min A to mod A for dynamic balance ADLs, SUP UB ADLs.SABRA decreased activity tolerance, min A sit to stand   SUP to CG   activity tolerance, LB ADLs, dynamic balance, UB/ LB ADls, UE mobility    Mobility   bed mobility: supervision with use of bed rail on flat bed, sit to stnad with RW and CGA/min A from WC, mod A from recliner, stand pivot transfer with RW and CGA/min A, gait x 93 feet with RW and min A, stair navigation x 4 8 inch step with B HR and step to gait with CGA/minA   supervision/CGA  D/C 8/28, follow up HHPT, DME: pt has a RW, TBD otherwise, biggest barrier to D/C is stair navigation 14 steps total (7,landing,7), endurance,    Communication                Safety/Cognition/ Behavioral Observations               Pain   Denies pain at this time   Will remain free from pain   Assess pain every shift and as needed     Skin   Skin intact   Will maintain skin integrity with no breakdown  Assess skin for breakdown every shift and assist with turns to keep skin free from breakdown      Discharge Planning:  Home with daughter who is retired, pt has a companion 8 hours per day to assist. Main issue is the 14 steps they have to go to second floor-7-7. Daughter hopeful will be able to go up and down the stairs at DC    Team Discussion:  Patient was admitted post debility due to UTI. Patient with hip pain/high blood pressure : medications adjusted by MD. Progress limited fatigue, decrease activity tolerance and poor endurance.  Patient on target to meet rehab goals: Currently, patient requires supervision assistance with upper body care and min-mod assistance with lower body care with slow movements. Patient was able to transfer with min assistance using a rolling walker. Patient was able to ambulate up to 84' using a rolling walker with min assistance. Overall goals at discharge are set for supervision/ CGA.   *See Care Plan and progress notes for long and short-term goals.   Revisions to Treatment Plan:  N/A   Teaching Needs: Safety, medications ,dietary modifications,  transfers, toileting , etc   Current Barriers to Discharge: Decreased caregiver support, Home enviroment access/layout, Incontinence, and Weight  Possible Resolutions to Barriers: Family Education Home health follow-up DME: R/W; W/C     Medical Summary Current Status: Debility, HTN, Anemia, T2DM, uinary retention, CKD, Obesity, HIp pain  Barriers to Discharge: Incontinence;Renal Insufficiency/Failure;Self-care education;Uncontrolled Hypertension;Uncontrolled Pain;Medical stability  Barriers to Discharge Comments: Debility, HTN, Anemia, T2DM, uinary retention, CKD, Obesity, HIp pain Possible Resolutions to [employer] Focus: Adjust BP medications, [place] fluid intake, Check FOBT, tylenol  for pain   Continued Need for Acute  Rehabilitation Level of Care: The patient requires daily medical management by a physician with specialized training in physical medicine and rehabilitation for the following reasons: Direction of a multidisciplinary physical rehabilitation program to maximize functional independence : Yes Medical management of patient stability for increased activity during participation in an intensive rehabilitation regime.: Yes Analysis of laboratory values and/or radiology reports with any subsequent need for medication adjustment and/or medical intervention. : Yes   I attest that I was present, lead the team conference, and concur with the assessment and plan of the team.   Timara Loma Gayo 06/12/2024, 1228 pm

## 2024-06-12 NOTE — Progress Notes (Signed)
 Occupational Therapy Session Note  Patient Details  Name: [name] Mathews MRN: [id number] Date of Birth: 08/23/1934  Today's Date: 06/12/2024 OT Individual Time: 8653-8580 OT Individual Time Calculation (min): 33 min  12 mins missed for toileting   Short Term Goals: Week 1:  OT Short Term Goal 1 (Week 1): patient will maintain balance during ADLs with CGA for LB ADLS OT Short Term Goal 2 (Week 1): patient will complete peri hygiene with min A OT Short Term Goal 3 (Week 1): patient will don socks with min A with AE  Skilled Therapeutic Interventions/Progress Updates:  Pt greeted supine in bed, pt agreeable to OT intervention.      Transfers/bed mobility/functional mobility:  Pt completed supine>sit with CGA but increased time and effort. Pt completed sit>stand from EOB with RW and MINA, MIn cues needed for hand placement.    ADLs:  Transfers:pt completed ambulatory transfer to bathroom with RW and MINA, min cues needed for RW proximity and mgmt Toileting: pt needed Increased time on toilet reporting feeling urge to void bowels but having difficulty doing so.    Ended session with pt seated on toilet with call bell within reach an and NT present.  Therapy Documentation Precautions:  Precautions Precautions: Fall Recall of Precautions/Restrictions: Intact Restrictions Weight Bearing Restrictions Per Provider Order: No General: General OT Amount of Missed Time: 12 Minutes  Pain: unrated pain reported in rectum, repositioned pt as needed and allowed increased time for toileting to relieve bowels.     Therapy/Group: Individual Therapy  Ronal Gift [facility] 06/12/2024, 2:25 PM

## 2024-06-12 NOTE — Progress Notes (Signed)
 Physical Therapy Session Note  Patient Details  Name: KEYLANI PERLSTEIN MRN: [id number] Date of Birth: 07/13/1934  Today's Date: 06/12/2024 PT Individual Time: 0800-0902 PT Individual Time Calculation (min): 62 min   Short Term Goals: Week 1:  PT Short Term Goal 1 (Week 1): Pt will complete bed mobility with min assist PT Short Term Goal 2 (Week 1): Pt will complete sit to stand with min assist consistently with LRAD PT Short Term Goal 3 (Week 1): Pt will ambulate 67' with min assist and LRAD PT Short Term Goal 4 (Week 1): Pt will complete up/down one 3 step with BHRs min assist  Skilled Therapeutic Interventions/Progress Updates:      Pt supine in bed upon arrival with pt daughter in room. Pt agreeable to therapy. Pt denies any pain. Pt daughter throughout session.   Supine to sit, sit to supine with use of bed features and supervision and ++ time, with pt demonstrating SOB.   Pt performed sit to stand throughout session with RW and min A throughout session, verbal cues provided for anterior weight shift and UE positioning.   Treatment session focused on stair navigation. Pt transported dependent in [facility] for time/energy conservation.   Pt navigated 2x4 6 inch steps with B HR and step to gait with CGA/min A, verbal cues provided for pursed lip breathing and upright posture. Pt required prolonged seated rest breaks 2/2 fatigue/SOB. Discussed stair set up-7 steps, landing, 7 steps - recommended pt family place chair on landing to allow seated rest breaks as needed for SOB/fatigue. Discussed energy conservation technique with emphasis on prioritize and plan to limit how many times pt is having to navigate the stairs each day. Pt and pt daughter verbalized understanding and agreeable.   Pt incontinent of bladder while navigating stairs. Pt ambulated ~10 feet to BSC over toielt with RW and min A especially with navigating threshold in bathroom, verbal and tactile cues provided for safety with  RW with emphasis on proximity. Pt doffed pants/brief and socks with total A for time/energy conservation. Pt continent of bladder while seated on BSC. Pt performed front pericare while standing with RW and CGA/min A. Pt required total A for back pericare 2/2 size and limited mobility. Pt daughter requesting sponge bath while seated on BSC. Pt performed upper body sponge bath with set up assist. Pt required max A overall for lower body.   Donned/doffed pants and shirt with total A for time and energy conservation.   Pt supine in bed at end of session with all needs within reach and bed alarm on.    Therapy Documentation Precautions:  Precautions Precautions: Fall Recall of Precautions/Restrictions: Intact Restrictions Weight Bearing Restrictions Per Provider Order: No  Therapy/Group: Individual Therapy  [facility] [name], [place], DPT  06/12/2024, 7:45 AM

## 2024-06-13 DIAGNOSIS — R5381 Other malaise: Secondary | ICD-10-CM | POA: Diagnosis not present

## 2024-06-13 DIAGNOSIS — I10 Essential (primary) hypertension: Secondary | ICD-10-CM | POA: Diagnosis not present

## 2024-06-13 DIAGNOSIS — R339 Retention of urine, unspecified: Secondary | ICD-10-CM | POA: Diagnosis not present

## 2024-06-13 DIAGNOSIS — Z794 Long term (current) use of insulin: Secondary | ICD-10-CM | POA: Diagnosis not present

## 2024-06-13 DIAGNOSIS — E1122 Type 2 diabetes mellitus with diabetic chronic kidney disease: Secondary | ICD-10-CM | POA: Diagnosis not present

## 2024-06-13 DIAGNOSIS — N189 Chronic kidney disease, unspecified: Secondary | ICD-10-CM | POA: Diagnosis not present

## 2024-06-13 DIAGNOSIS — K5900 Constipation, unspecified: Secondary | ICD-10-CM | POA: Diagnosis not present

## 2024-06-13 LAB — CBC
HCT: 25.2 % — ABNORMAL LOW (ref 36.0–46.0)
Hemoglobin: 8.4 g/dL — ABNORMAL LOW (ref 12.0–15.0)
MCH: 29.9 pg (ref 26.0–34.0)
MCHC: 33.3 g/dL (ref 30.0–36.0)
MCV: 89.7 fL (ref 80.0–100.0)
Platelets: 219 K/uL (ref 150–400)
RBC: 2.81 MIL/uL — ABNORMAL LOW (ref 3.87–5.11)
RDW: 14.6 % (ref 11.5–15.5)
WBC: 6.1 K/uL (ref 4.0–10.5)
nRBC: 0 % (ref 0.0–0.2)

## 2024-06-13 LAB — GLUCOSE, CAPILLARY
Glucose-Capillary: 153 mg/dL — ABNORMAL HIGH (ref 70–99)
Glucose-Capillary: 180 mg/dL — ABNORMAL HIGH (ref 70–99)
Glucose-Capillary: 162 mg/dL — ABNORMAL HIGH (ref 70–99)
Glucose-Capillary: 149 mg/dL — ABNORMAL HIGH (ref 70–99)

## 2024-06-13 LAB — BASIC METABOLIC PANEL WITH GFR
Anion gap: 10 (ref 5–15)
BUN: 25 mg/dL — ABNORMAL HIGH (ref 8–23)
CO2: 22 mmol/L (ref 22–32)
Calcium: 8.4 mg/dL — ABNORMAL LOW (ref 8.9–10.3)
Chloride: 106 mmol/L (ref 98–111)
Creatinine, Ser: 1.58 mg/dL — ABNORMAL HIGH (ref 0.44–1.00)
GFR, Estimated: 31 mL/min — ABNORMAL LOW (ref >60)
Glucose, Bld: 101 mg/dL — ABNORMAL HIGH (ref 70–99)
Potassium: 3.8 mmol/L (ref 3.5–5.1)
Sodium: 138 mmol/L (ref 135–145)

## 2024-06-13 MED ORDER — ACETAMINOPHEN 325 MG PO TABS: 650.0000 mg | ORAL_TABLET | ORAL | Status: AC | PRN

## 2024-06-13 MED ORDER — MELATONIN 3 MG PO TABS: 3.0000 mg | ORAL_TABLET | Freq: Every evening | ORAL | Status: DC | PRN

## 2024-06-13 MED ORDER — ENOXAPARIN SODIUM 30 MG/0.3ML IJ SOSY
30.0000 mg | PREFILLED_SYRINGE | INTRAMUSCULAR | Status: DC
  Administered 2024-06-13 – 2024-06-28 (×16): 30 mg via SUBCUTANEOUS
  Filled 2024-06-13 (×16): qty 0.3

## 2024-06-13 NOTE — Plan of Care (Signed)
  Problem: RH BOWEL ELIMINATION Goal: RH STG MANAGE BOWEL WITH ASSISTANCE Description: STG Manage Bowel with Assistance. Outcome: Progressing   Problem: RH BLADDER ELIMINATION Goal: RH STG MANAGE BLADDER WITH ASSISTANCE Description: STG Manage Bladder With minimal  Assistance Outcome: Progressing   Problem: RH SKIN INTEGRITY Goal: RH STG SKIN FREE OF INFECTION/BREAKDOWN Description: Manage skin free of infection/breakdown with minimal assistance Outcome: Progressing   Problem: RH PAIN MANAGEMENT Goal: RH STG PAIN MANAGED AT OR BELOW PT'S PAIN GOAL Outcome: Progressing

## 2024-06-13 NOTE — Progress Notes (Signed)
 Physical Therapy Session Note  Patient Details  Name: [name] Mathews MRN: [id number] Date of Birth: 1933/11/09  Today's Date: 06/13/2024 PT Individual Time: 9052-8969 and 1108-1205 PT Individual Time Calculation (min): 43 min and 57 min  Short Term Goals: Week 1:  PT Short Term Goal 1 (Week 1): Pt will complete bed mobility with min assist PT Short Term Goal 2 (Week 1): Pt will complete sit to stand with min assist consistently with LRAD PT Short Term Goal 3 (Week 1): Pt will ambulate 21' with min assist and LRAD PT Short Term Goal 4 (Week 1): Pt will complete up/down one 3 step with BHRs min assist  Skilled Therapeutic Interventions/Progress Updates: Tx1: Pt presented in bed agreeable to therapy. Pt states increased pain in hip due to activities yesterday, nsg notified and meds received at end of session. Session focused on bed mobility and functional transfers. Pt completed bed mobility with close supervision, significant increased time and use of bed features. Pt required minA for donning bra and set up assist for long sleeved shirt. PTA threaded pants for time management. Pt attempted to stand from lowered bed however unable to power up due to pain in hip. PTA then elevated bed slightly with pt able to stand with minA. PTA providing modA for clothing management however pt needed to sit again due to pain in hip. Pt able to stand again with minA and complete stand step transfer to w/c with CGA. PTA obtained hot pack and placed at hip while notifying nsg for pain meds. Pt left seated in w/c at end of session with call bell within reach and needs met.   Tx2: Pt presented in w/c agreeable to therapy. . Pt states some relief of pain with combination of hot pack and pain meds. Hot pack removed and skin inspected with no skin irritation. Pt transported to day room for time management. Initiated seated therex for BLE strengthening as follows.  Ankle pumps x 20 LAQ with 2lb weight 2 x 10  Hip ER with  red theraband 2 x 10 Hamstring curls with red theraband  Pt then stood with CGA and ambulated ~19ft to NuStep. Participated in NuStep L3 x 8 min total avg 20 SPM however at times would be able to increase to 30-40 SPM. Pt did take 3 brief rests (<30 sec). Pt then ambulated with RW and CGA ~79ft. Pt ambulated with significant forward flexed posture decreased self selected gait speed. Pt transported remaining distance to room and completed ambulatory transfer to recliner. Pt left in recliner at end of session with call bell within reach and needs met.      Therapy Documentation Precautions:  Precautions Precautions: Fall Recall of Precautions/Restrictions: Intact Restrictions Weight Bearing Restrictions Per Provider Order: No General:   Vital Signs: Therapy Vitals Temp: 98.1 F (36.7 C) Pulse Rate: (!) 55 Resp: 16 BP: (!) 156/58 Patient Position (if appropriate): Sitting Oxygen Therapy SpO2: 100 % O2 Device: Room Air   Therapy/Group: Individual Therapy  Viktorya Arguijo 06/13/2024, 3:39 PM

## 2024-06-13 NOTE — Progress Notes (Signed)
 Physical Therapy Session Note  Patient Details  Name: [name] Mathews MRN: [id number] Date of Birth: 1934/02/25  Today's Date: 06/13/2024 PT Individual Time: 8499-8471 PT Individual Time Calculation (min): 28 min   Short Term Goals: Week 1:  PT Short Term Goal 1 (Week 1): Pt will complete bed mobility with min assist PT Short Term Goal 2 (Week 1): Pt will complete sit to stand with min assist consistently with LRAD PT Short Term Goal 3 (Week 1): Pt will ambulate 24' with min assist and LRAD PT Short Term Goal 4 (Week 1): Pt will complete up/down one 3 step with BHRs min assist  Skilled Therapeutic Interventions/Progress Updates:      Pt received from OT. Pt agreeable to therapy. Pt denies any pain.   Pt requesting to return to room 2/2 fatigue. Pt transported dependent in [facility] for time/energy conservation to allow pt to use bathroom. Pt reports no need to use the bathroom at this time. Pt performed ambulatory transfer to bed with RW and min A, verbal cues provided for proximity for RW and UE positioning on bed prior to sitting.   Sit to supine with supervision.   Discussed pt binder in room. Reviewed debility handout with pt with emphasis on what it is, causes, signs/symptoms, treatment. Education provided regarding POC, goals and need to build pt activity tolerance/endurance. Pt verbalized understanding and agreeable.   Pt supine in bed with all needs within reach and bed alarm on.  Therapy Documentation Precautions:  Precautions Precautions: Fall Recall of Precautions/Restrictions: Intact Restrictions Weight Bearing Restrictions Per Provider Order: No  Therapy/Group: Individual Therapy  [facility] [name], [place], DPT  06/13/2024, 4:13 PM

## 2024-06-13 NOTE — Discharge Instructions (Addendum)
 Inpatient Rehab Discharge Instructions  Mari S Hopwood Discharge date and time:  06/28/24  Activities/Precautions/ Functional Status: Activity: no lifting, driving, or strenuous exercise till cleared by MD Diet: cardiac diet Wound Care: none needed   Functional status:  ___ No restrictions     ___ Walk up steps independently _X__ 24/7 supervision/assistance   ___ Walk up steps with assistance ___ Intermittent supervision/assistance  ___ Bathe/dress independently ___ Walk with walker     _X__ Bathe/dress with assistance ___ Walk Independently    ___ Shower independently ___ Walk with assistance    ___ Shower with assistance _X__ No alcohol      ___ Return to work/school ________  Special Instructions:    COMMUNITY REFERRALS UPON DISCHARGE:    Home Health:   PT   &    OT                Agency:[facility]      Phone:[phone]    Medical Equipment/Items Ordered:  TRANSPORT CHAIR                                                 Agency/Supplier:[facility]  [phone]   My questions have been answered and I understand these instructions. I will adhere to these goals and the provided educational materials after my discharge from the hospital.  Patient/Caregiver Signature _______________________________ Date __________  Clinician Signature _______________________________________ Date __________  Please bring this form and your medication list with you to all your follow-up doctor's appointments.

## 2024-06-13 NOTE — Progress Notes (Signed)
 Patient ID: [name] Mathews, female   DOB: 1934/02/07, 89 y.o.   MRN: [id number]  Spoke with regina-daughter via telephone to give team conference update regarding goals and discharge date. She will be out of town until 8/29 wanted to know if could discharge 8/30. Team and MD messaged for this change.

## 2024-06-13 NOTE — Progress Notes (Addendum)
 Occupational Therapy Session Note  Patient Details  Name: [name] Mathews MRN: [id number] Date of Birth: September 05, 1934  Today's Date: 06/13/2024 OT Individual Time: 1348-1500 OT Individual Time Calculation (min): 72 min    Short Term Goals: Week 1:  OT Short Term Goal 1 (Week 1): patient will maintain balance during ADLs with CGA for LB ADLS OT Short Term Goal 2 (Week 1): patient will complete peri hygiene with min A OT Short Term Goal 3 (Week 1): patient will don socks with min A with AE  Skilled Therapeutic Interventions/Progress Updates:  Patient agreeable to participate in OT session. Reports no pain level.   Patient participated in skilled OT session focusing on functional activity tolerance, dynamic balance, UE strengthening, core strengthening, socialization, and UE. LE mobility with increased speed. Patient completed transfer to wc Cg min A RW. Patient completed UBE for 10 minutes active with several rest breaks to increase UE strength, ROM, and functional activity tolerance. Patient completed functional mobility with RW 20 ft. Patient completed dance activity for dynamic sititng balance, core strengthening, and UE/ LE mobility to increase ability to complete ADLs. Passed off to ADLs.    Therapy Documentation Precautions:  Precautions Precautions: Fall Recall of Precautions/Restrictions: Intact Restrictions Weight Bearing Restrictions Per Provider Order: No  Therapy/Group: Individual Therapy  D'mariea L Charles Niese 06/13/2024, 7:45 AM

## 2024-06-13 NOTE — Progress Notes (Addendum)
 PROGRESS NOTE   Subjective/Complaints:  No concerns elicited this morning.  Patient lying comfortably in bed.  Right hip pain is doing better today.  ROS: Patient denies chills, rash, vision changes, nausea, vomiting, diarrhea, shortness of breath, abdominal pain, chest pain +Rt hip pain -patient reports improved this morning  Objective:   No results found. Recent Labs    06/12/24 0500 06/13/24 0432  WBC 6.4 6.1  HGB 8.4* 8.4*  HCT 25.6* 25.2*  PLT 217 219   Recent Labs    06/12/24 0500 06/13/24 0432  NA 139 138  K 3.8 3.8  CL 107 106  CO2 23 22  GLUCOSE 131* 101*  BUN 23 25*  CREATININE 1.56* 1.58*  CALCIUM  8.7* 8.4*    Intake/Output Summary (Last 24 hours) at 06/13/2024 0902 Last data filed at 06/13/2024 0741 Gross per 24 hour  Intake 480 ml  Output --  Net 480 ml        Physical Exam: Vital Signs Blood pressure (!) 130/42, pulse 61, temperature 98.4 F (36.9 C), resp. rate 18, height 5' 7 (1.702 m), weight 98.8 kg, SpO2 97%.  General: No acute distress, laying in bed appears comfortable HEENT: NCAT, EOMI, oral membranes a little dry Cards: RRR Chest: Clear to auscultation bilaterally, nonlabored breathing  abdomen: Soft, NT, ND, + BS normal active Skin: No breakdown noted on visible portion of skin Extremities: no edema Psych: pleasant and appropriate  Skin: C/D/I. No apparent lesions.  MSK:      No apparent deformity.  Pain with internal and external rotation of right hip.  No significant greater trochanter tenderness noted.       Neurologic exam:  Cognition: AAO x4+ Mild delay in recall.  Language: Fluent Mood: Very pleasant Sensation: To light touch intact in BL UEs and LEs  CN: 2-12 grossly intact.  Coordination: Bilateral lower extremity tremors with exertion      Strength: Bilateral upper extremities 4/5 proximally, 5/5 distally      Bilateral lower extremities 4-/5 proximally, 4/5  distally Prior neuro assessment is c/w today's exam 06/13/2024.   Assessment/Plan: 1. Functional deficits which require 3+ hours per day of interdisciplinary therapy in a comprehensive inpatient rehab setting. Physiatrist is providing close team supervision and 24 hour management of active medical problems listed below. Physiatrist and rehab team continue to assess barriers to discharge/monitor patient progress toward functional and medical goals  Care Tool:  Bathing    Body parts bathed by patient: Right arm, Left arm, Chest, Abdomen, Right upper leg, Left upper leg, Right lower leg, Left lower leg, Face     Body parts n/a: Buttocks, Front perineal area, Right lower leg, Left lower leg   Bathing assist Assist Level: Moderate Assistance - Patient 50 - 74%     Upper Body Dressing/Undressing Upper body dressing   What is the patient wearing?: Pull over shirt    Upper body assist Assist Level: Minimal Assistance - Patient > 75%    Lower Body Dressing/Undressing Lower body dressing      What is the patient wearing?: Underwear/pull up     Lower body assist Assist for lower body dressing: Maximal Assistance - Patient 25 -  49%     Toileting Toileting    Toileting assist Assist for toileting: Moderate Assistance - Patient 50 - 74%     Transfers Chair/bed transfer  Transfers assist  Chair/bed transfer activity did not occur: Safety/medical concerns  Chair/bed transfer assist level: Minimal Assistance - Patient > 75%     Locomotion Ambulation   Ambulation assist      Assist level: Minimal Assistance - Patient > 75% Assistive device: Walker-rolling Max distance: 97'   Walk 10 feet activity   Assist     Assist level: Contact Guard/Touching assist Assistive device: Walker-rolling   Walk 50 feet activity   Assist Walk 50 feet with 2 turns activity did not occur: Safety/medical concerns  Assist level: Minimal Assistance - Patient > 75% Assistive device:  Walker-rolling    Walk 150 feet activity   Assist Walk 150 feet activity did not occur: Safety/medical concerns (fatigue)         Walk 10 feet on uneven surface  activity   Assist Walk 10 feet on uneven surfaces activity did not occur: Safety/medical concerns         Wheelchair     Assist Is the patient using a wheelchair?: Yes Type of Wheelchair: Manual    Wheelchair assist level: Dependent - Patient 0%      Wheelchair 50 feet with 2 turns activity    Assist        Assist Level: Dependent - Patient 0%   Wheelchair 150 feet activity     Assist      Assist Level: Dependent - Patient 0%   Blood pressure (!) 130/42, pulse 61, temperature 98.4 F (36.9 C), resp. rate 18, height 5' 7 (1.702 m), weight 98.8 kg, SpO2 97%.  Medical Problem List and Plan: 1. Functional deficits secondary to debility s/p UTI             -patient may shower             -ELOS/Goals: 21-24 days, Min A PT/OT             -Continue CIR therapies including PT, OT    - Expected discharge 8/28 although may be adjusted to 8/30, family out of town.  Will discuss with team 2.  Antithrombotics: -DVT/anticoagulation:  Pharmaceutical: Lovenox              -antiplatelet therapy: N/A 3. Pain Management: Tylenol  prn.   -8/13 patient like to avoid stronger pain medications.  Having some right hip pain after stairs.  Will try Tylenol  and heating pad for now.  8/14 hip pain doing a little better, continue current regimen for now, we will see how she does with therapy 4. Mood/Behavior/Sleep: LCSW to follow for evaluation and support.              -antipsychotic agents: N/A 5. Neuropsych/cognition: This patient is not fully capable of making decisions on her own behalf. 6. Skin/Wound Care: Routine pressure relief meausre.  7. Fluids/Electrolytes/Nutrition: Monitor I/O. Check CMET in am.  8.HTN: Monitor BP TID- continue amlodipine  and hydralazine .     06/13/2024    1:25 PM 06/13/2024     7:00 AM 06/13/2024    6:51 AM  Vitals with BMI  Weight  217 lbs 13 oz 169 lbs 5 oz  BMI  34.11 26.51  Systolic 156    Diastolic 58    Pulse 55    8/9 increase hydralazine  to 37.5mg  BID 8/10 reports patient had been advised to take amlodipine   at night, medication timing adjusted.  She was also taking nebivolol .  Will hold off on adding this today as we are changing amlodipine  timing.  If BP still elevated tomorrow consider adding low-dose Nebivolol  8/12 will restart Nebivolol  at 2.5 mg  8/13 increase Nebivolol  to 5mg  8/14 BP controlled doing better overall, continue current regimen and monitor 9. Anemia of chronic disease: Negative work up by Dr. Freddie and Dr Lanny last month.     - Appears to be gradually trending down on review on records. Check FOBT  -8/13 hemoglobin overall stable at 8.4, discussed FOBT nursing team  -8/14 hemoglobin remained stable at 8.4 10. T2DM: Diet controlled- Monitor BS ac/hs.  8/13-14 controlled overall, continue to monitor CBG (last 3)  Recent Labs    06/12/24 1622 06/12/24 2053 06/13/24 0751  GLUCAP 184* 128* 153*    11. H/o UTIs/Urinary retention: Question due to Merbetriq--d/c a couple of days ago? Foley replaced. Will remove and start bladder program             -monitor voiding with PVR/bladder scan.   -Will ask nursing to get some PVRs  8/14 I see 1 bladder scan yesterday for  433 mL, unsure if this is before or after she voided.  Will asked nursing to document PVRs 12. Dementia: Used to be on On Galantamine  and now on Donepezil .  13. Constipation: Had enema yesterday after no BM X one week  - 8/10 lactulose  changed from daily to as needed, had a large bowel movement yesterday  8/11 last bm today. Pt/daughter satisfied with current regimen   8/13 LBM documented 6/11, will add prn Miralax  for mild constipation, encourage oral fluid intake  8/14 LBM today 14.  CKD 3b: Monitor with serial checks as well as UOP/voiding Cr at [facility] 7/29: 1.61 7/30: 1.52 7/31: 1.47 8/1: 1.89 8/2: 1.50 8/3: 1.36  8/4  1.34 8/5 1.32 8/7 1.30   - 8/9 creatinine 1.39, overall stable continue to monitor   8/11-a slight bump in BUN/Cr today (20/1.52).  I'm not too concerned at present. WIll encourage PO and follow up lab work on Wednesday.   8/14 Cr little higher at 1.58/BUN 25, still around overall baseline, continue encourage oral fluids again today 15. Obesity. Class 2. Dietary counseling   -Body mass index is 34.11 kg/m.     LOS: 6 days A FACE TO FACE EVALUATION WAS PERFORMED  Murray Collier 06/13/2024, 9:02 AM

## 2024-06-14 DIAGNOSIS — R413 Other amnesia: Secondary | ICD-10-CM | POA: Diagnosis not present

## 2024-06-14 DIAGNOSIS — I10 Essential (primary) hypertension: Secondary | ICD-10-CM | POA: Diagnosis not present

## 2024-06-14 DIAGNOSIS — E1169 Type 2 diabetes mellitus with other specified complication: Secondary | ICD-10-CM | POA: Diagnosis not present

## 2024-06-14 DIAGNOSIS — I129 Hypertensive chronic kidney disease with stage 1 through stage 4 chronic kidney disease, or unspecified chronic kidney disease: Secondary | ICD-10-CM | POA: Diagnosis not present

## 2024-06-14 DIAGNOSIS — M25551 Pain in right hip: Secondary | ICD-10-CM | POA: Diagnosis not present

## 2024-06-14 DIAGNOSIS — E1122 Type 2 diabetes mellitus with diabetic chronic kidney disease: Secondary | ICD-10-CM | POA: Diagnosis not present

## 2024-06-14 DIAGNOSIS — Z9181 History of falling: Secondary | ICD-10-CM | POA: Diagnosis not present

## 2024-06-14 DIAGNOSIS — Z029 Encounter for administrative examinations, unspecified: Secondary | ICD-10-CM | POA: Diagnosis not present

## 2024-06-14 DIAGNOSIS — N184 Chronic kidney disease, stage 4 (severe): Secondary | ICD-10-CM | POA: Diagnosis not present

## 2024-06-14 DIAGNOSIS — K5900 Constipation, unspecified: Secondary | ICD-10-CM | POA: Diagnosis not present

## 2024-06-14 DIAGNOSIS — R5381 Other malaise: Secondary | ICD-10-CM | POA: Diagnosis not present

## 2024-06-14 DIAGNOSIS — M545 Low back pain, unspecified: Secondary | ICD-10-CM | POA: Diagnosis not present

## 2024-06-14 DIAGNOSIS — R269 Unspecified abnormalities of gait and mobility: Secondary | ICD-10-CM | POA: Diagnosis not present

## 2024-06-14 LAB — GLUCOSE, CAPILLARY
Glucose-Capillary: 141 mg/dL — ABNORMAL HIGH (ref 70–99)
Glucose-Capillary: 187 mg/dL — ABNORMAL HIGH (ref 70–99)
Glucose-Capillary: 164 mg/dL — ABNORMAL HIGH (ref 70–99)
Glucose-Capillary: 156 mg/dL — ABNORMAL HIGH (ref 70–99)

## 2024-06-14 MED ORDER — POLYSACCHARIDE IRON COMPLEX 150 MG PO CAPS
150.0000 mg | ORAL_CAPSULE | Freq: Every day | ORAL | Status: DC
  Administered 2024-06-15 – 2024-06-28 (×13): 150 mg via ORAL
  Filled 2024-06-14 (×14): qty 1

## 2024-06-14 NOTE — Plan of Care (Signed)
  Problem: Consults Goal: RH GENERAL PATIENT EDUCATION Description: See Patient Education module for education specifics. Outcome: Progressing   Problem: RH BLADDER ELIMINATION Goal: RH STG MANAGE BLADDER WITH ASSISTANCE Description: STG Manage Bladder With minimal  Assistance Outcome: Progressing   Problem: RH SKIN INTEGRITY Goal: RH STG SKIN FREE OF INFECTION/BREAKDOWN Description: Manage skin free of infection/breakdown with minimal assistance Outcome: Progressing   Problem: RH SAFETY Goal: RH STG ADHERE TO SAFETY PRECAUTIONS W/ASSISTANCE/DEVICE Description: STG Adhere to Safety Precautions With minimal assistance Assistance/Device. Outcome: Progressing   Problem: RH PAIN MANAGEMENT Goal: RH STG PAIN MANAGED AT OR BELOW PT'S PAIN GOAL Outcome: Progressing

## 2024-06-14 NOTE — Progress Notes (Signed)
 Patient ID: [name] Mathews, female   DOB: 1933/11/07, 89 y.o.   MRN: [id number]  Met daughter in the hallway to let know team and MD feel 8/30 can be doable and they have things to work on until then. She has brought the measurements of the doorways and house to give to therapists. Pt is doing well and pushing herself in therapies

## 2024-06-14 NOTE — Progress Notes (Signed)
 Physical Therapy Session Note  Patient Details  Name: [name] Mathews MRN: [id number] Date of Birth: Nov 09, 1933  Today's Date: 06/14/2024 PT Individual Time: 0803-0830 PT Individual Time Calculation (min): 27 min   Short Term Goals: Week 1:  PT Short Term Goal 1 (Week 1): Pt will complete bed mobility with min assist PT Short Term Goal 2 (Week 1): Pt will complete sit to stand with min assist consistently with LRAD PT Short Term Goal 3 (Week 1): Pt will ambulate 82' with min assist and LRAD PT Short Term Goal 4 (Week 1): Pt will complete up/down one 3 step with BHRs min assist  Skilled Therapeutic Interventions/Progress Updates: Pt presented in w/c agreeable to therapy. Pt c/o pain 5/10 at R hip, no intervention requested at start of session. Pt transported to day room for time management. Participated in Cybex Kinetron 60cm/sec x 2 min for warm up and increased cardiovascular recruitment. Performed Sit to stand with CGA with increased time/effort. Participated in hip flexion with RLE x 10, standing SLR/RLE with x10, and standing marches x 10. Participated in toe taps to 4in step x 5 bilaterally deferred additional due to increased pain. Pt propelled ~52ft with BUE for increased endurance then transported remaining distance back to room. Pt left in w/c at end of session with call bell within reach and needs met.      Therapy Documentation Precautions:  Precautions Precautions: Fall Recall of Precautions/Restrictions: Intact Restrictions Weight Bearing Restrictions Per Provider Order: No   Therapy/Group: Individual Therapy  Darwin Guastella 06/14/2024, 12:20 PM

## 2024-06-14 NOTE — Progress Notes (Signed)
 PROGRESS NOTE   Subjective/Complaints:  Patient working with therapy in the gym today on a group treatment.  Patient with no new concerns today.  She remembers discussing drinking more water.  ROS: Patient denies chills, rash, vision changes, nausea, vomiting, diarrhea, shortness of breath, abdominal pain, chest pain +Rt hip pain -improved  Objective:   No results found. Recent Labs    06/12/24 0500 06/13/24 0432  WBC 6.4 6.1  HGB 8.4* 8.4*  HCT 25.6* 25.2*  PLT 217 219   Recent Labs    06/12/24 0500 06/13/24 0432  NA 139 138  K 3.8 3.8  CL 107 106  CO2 23 22  GLUCOSE 131* 101*  BUN 23 25*  CREATININE 1.56* 1.58*  CALCIUM  8.7* 8.4*    Intake/Output Summary (Last 24 hours) at 06/14/2024 1743 Last data filed at 06/14/2024 1245 Gross per 24 hour  Intake 480 ml  Output --  Net 480 ml        Physical Exam: Vital Signs Blood pressure (!) 151/48, pulse (!) 58, temperature 98.5 F (36.9 C), resp. rate 16, height 5' 7 (1.702 m), weight 98.8 kg, SpO2 100%.  General: No acute distress, working in group therapy HEENT: NCAT, EOMI, oral membranes moist Cards: RRR Chest: Clear to auscultation bilaterally, nonlabored breathing  abdomen: Soft, NT, ND, + BS normal active Skin: No breakdown noted on visible portion of skin Extremities: no edema Psych: pleasant and appropriate  Skin: C/D/I. No apparent lesions.  MSK:      No apparent deformity.  Pain with internal and external rotation of right hip.  No significant greater trochanter tenderness noted.       Neurologic exam:  Cognition: AAO x4 Language: Fluent Mood: Very pleasant Sensation: To light touch intact in BL UEs and LEs  CN: 2-12 grossly intact.  Coordination: Bilateral lower extremity tremors with exertion      Strength: Bilateral upper extremities 4/5 proximally, 5/5 distally      Bilateral lower extremities 4-/5 proximally, 4/5 distally Prior  neuro assessment is c/w today's exam 06/14/2024.   Assessment/Plan: 1. Functional deficits which require 3+ hours per day of interdisciplinary therapy in a comprehensive inpatient rehab setting. Physiatrist is providing close team supervision and 24 hour management of active medical problems listed below. Physiatrist and rehab team continue to assess barriers to discharge/monitor patient progress toward functional and medical goals  Care Tool:  Bathing    Body parts bathed by patient: Right arm, Left arm, Chest, Abdomen, Right upper leg, Left upper leg, Right lower leg, Left lower leg, Face     Body parts n/a: Buttocks, Front perineal area, Right lower leg, Left lower leg   Bathing assist Assist Level: Moderate Assistance - Patient 50 - 74%     Upper Body Dressing/Undressing Upper body dressing   What is the patient wearing?: Pull over shirt    Upper body assist Assist Level: Minimal Assistance - Patient > 75%    Lower Body Dressing/Undressing Lower body dressing      What is the patient wearing?: Underwear/pull up     Lower body assist Assist for lower body dressing: Maximal Assistance - Patient 25 - 49%  Toileting Toileting    Toileting assist Assist for toileting: Moderate Assistance - Patient 50 - 74%     Transfers Chair/bed transfer  Transfers assist  Chair/bed transfer activity did not occur: Safety/medical concerns  Chair/bed transfer assist level: Minimal Assistance - Patient > 75%     Locomotion Ambulation   Ambulation assist      Assist level: Minimal Assistance - Patient > 75% Assistive device: Walker-rolling Max distance: 97'   Walk 10 feet activity   Assist     Assist level: Contact Guard/Touching assist Assistive device: Walker-rolling   Walk 50 feet activity   Assist Walk 50 feet with 2 turns activity did not occur: Safety/medical concerns  Assist level: Minimal Assistance - Patient > 75% Assistive device: Walker-rolling     Walk 150 feet activity   Assist Walk 150 feet activity did not occur: Safety/medical concerns (fatigue)         Walk 10 feet on uneven surface  activity   Assist Walk 10 feet on uneven surfaces activity did not occur: Safety/medical concerns         Wheelchair     Assist Is the patient using a wheelchair?: Yes Type of Wheelchair: Manual    Wheelchair assist level: Dependent - Patient 0%      Wheelchair 50 feet with 2 turns activity    Assist        Assist Level: Dependent - Patient 0%   Wheelchair 150 feet activity     Assist      Assist Level: Dependent - Patient 0%   Blood pressure (!) 151/48, pulse (!) 58, temperature 98.5 F (36.9 C), resp. rate 16, height 5' 7 (1.702 m), weight 98.8 kg, SpO2 100%.  Medical Problem List and Plan: 1. Functional deficits secondary to debility s/p UTI             -patient may shower             -ELOS/Goals: 21-24 days, Min A PT/OT             -Continue CIR therapies including PT, OT    - Expected discharge 8/28 although may be adjusted to 8/30, family out of town.  Will discuss with team 2.  Antithrombotics: -DVT/anticoagulation:  Pharmaceutical: Lovenox              -antiplatelet therapy: N/A 3. Pain Management: Tylenol  prn.   -8/13 patient like to avoid stronger pain medications.  Having some right hip pain after stairs.  Will try Tylenol  and heating pad for now.  8/14 hip pain doing a little better, continue current regimen for now, we will see how she does with therapy  8/15 discussed scheduling some Tylenol  before therapy, patient declines for now.  She reports overall controlled 4. Mood/Behavior/Sleep: LCSW to follow for evaluation and support.              -antipsychotic agents: N/A 5. Neuropsych/cognition: This patient is not fully capable of making decisions on her own behalf. 6. Skin/Wound Care: Routine pressure relief meausre.  7. Fluids/Electrolytes/Nutrition: Monitor I/O. Check CMET in am.   8.HTN: Monitor BP TID- continue amlodipine  and hydralazine .     06/14/2024    2:04 PM 06/14/2024    4:50 AM 06/13/2024    8:26 PM  Vitals with BMI  Systolic 151 152 845  Diastolic 48 54 52  Pulse 58 60 58  8/9 increase hydralazine  to 37.5mg  BID 8/10 reports patient had been advised to take amlodipine  at night, medication  timing adjusted.  She was also taking nebivolol .  Will hold off on adding this today as we are changing amlodipine  timing.  If BP still elevated tomorrow consider adding low-dose Nebivolol  8/12 will restart Nebivolol  at 2.5 mg  8/13 increase Nebivolol  to 5mg  8/14 BP controlled doing better overall, continue current regimen and monitor 9. Anemia of chronic disease: Negative work up by Dr. Freddie and Dr Lanny last month.     - Appears to be gradually trending down on review on records. Check FOBT  -8/13 hemoglobin overall stable at 8.4, discussed FOBT nursing team  -8/14 hemoglobin remained stable at 8.4  Continue to monitor for FOBT but overall appears stable 10. T2DM: Diet controlled- Monitor BS ac/hs.  8/15 fair control, continue to monitor CBG (last 3)  Recent Labs    06/14/24 0636 06/14/24 1118 06/14/24 1640  GLUCAP 141* 187* 164*    11. H/o UTIs/Urinary retention: Question due to Merbetriq--d/c a couple of days ago?  Appears this.  For urinary retention.  Foley replaced. Will remove and start bladder program             -monitor voiding with PVR/bladder scan.   -Will ask nursing to get some PVRs  8/14 I see 1 bladder scan yesterday for  433 mL, unsure if this is before or after she voided.  Will asked nursing to document PVRs  8/15 bladder scan of 270, looks like she had to void a few minutes prior.  She is currently on Flomax .  Will asked nursing to check a few more.  If continues consider Urecholine  12. Dementia: Used to be on On Galantamine  and now on Donepezil .  13. Constipation: Had enema yesterday after no BM X one week  - 8/10 lactulose  changed  from daily to as needed, had a large bowel movement yesterday  8/11 last bm today. Pt/daughter satisfied with current regimen   8/13 LBM documented 6/11, will add prn Miralax  for mild constipation, encourage oral fluid intake  8/15 LBM yesterday 14.  CKD 3b: Monitor with serial checks as well as UOP/voiding Cr at [facility] 7/29: 1.61 7/30: 1.52 7/31: 1.47 8/1: 1.89 8/2: 1.50 8/3: 1.36  8/4  1.34 8/5 1.32 8/7 1.30   - 8/9 creatinine 1.39, overall stable continue to monitor   8/11-a slight bump in BUN/Cr today (20/1.52).  I'm not too concerned at present. WIll encourage PO and follow up lab work on Wednesday.   8/14 Cr little higher at 1.58/BUN 25, still around overall baseline, continue encourage oral fluids again today  8/15 discussed oral fluid intake, continue to monitor labs.  If increases further consider some short-term IV fluids 15. Obesity. Class 2. Dietary counseling   -Body mass index is 34.11 kg/m.     LOS: 7 days A FACE TO FACE EVALUATION WAS PERFORMED  Murray Collier 06/14/2024, 5:43 PM

## 2024-06-14 NOTE — Progress Notes (Signed)
 Occupational Therapy Weekly Progress Note  Patient Details  Name: [name] Mathews MRN: [id number] Date of Birth: 28-Jul-1934  Beginning of progress report period: June 08, 2024 End of progress report period: June 14, 2024   SESSION 1: Today's Date: 06/14/2024 OT Individual Time: 0700-0800 OT Individual Time Calculation (min): 60 min   SESSION 2: Today's Date: 06/14/2024 OT Individual Time: 8699-8654 OT Individual Time Calculation (min): 45 min     Patient has met 2 of 3 short term goals.  Patient continues to progress towards LTG. Patient demonstrates continued deficits with balance, functional activity tolerance, and UE ROM to reach posterior for back side hygiene. Patient is progressing in all ADL categories however requires increased time. Family has been educated due to attendance at session, however will continue education with progression  Patient continues to demonstrate the following deficits: muscle weakness, decreased cardiorespiratoy endurance, and decreased standing balance and decreased balance strategies and therefore will continue to benefit from skilled OT intervention to enhance overall performance with BADL.  Patient progressing toward long term goals..  Continue plan of care.  OT Short Term Goals Week 1:  OT Short Term Goal 1 (Week 1): patient will maintain balance during ADLs with CGA for LB ADLS OT Short Term Goal 1 - Progress (Week 1): Met OT Short Term Goal 2 (Week 1): patient will complete peri hygiene with min A OT Short Term Goal 2 - Progress (Week 1): Partly met (able to complete front peri hygiene, assist for posterior) OT Short Term Goal 3 (Week 1): patient will don socks with min A with AE OT Short Term Goal 3 - Progress (Week 1): Met Week 2:  OT Short Term Goal 1 (Week 2): patient will complete showering with CGA OT Short Term Goal 2 (Week 2): patient will tolerate functional activity for 10 minutes  with 2 or fewer rests to increase activity  tolerance.   Session 1: Skilled Therapeutic Interventions/Progress Updates:   Patient agreeable to participate in OT session. Reports 5/10 pain level, medication refused.   Patient participated in skilled OT session focusing on ADL completion. Patient completed eating independently sitting on EOB with good balance., transfer to wc mod A. Completed UB bathing SU, UB dressing shirt SU, bra donning mod A. Patient completed LB doffing mod A for stand, underwear management CGA. Patient able to complete LB dressing min to mod A.  Therapist facilitated increased efficiency in ADLs in order to improve safety and activity tolerance   Session 2: Skilled Therapeutic Interventions/Progress Updates:   Patient agreeable to participate in OT session. Reports 5/10 pain level given heat pack.   Patient participated in skilled OT session focusing on functional mobility and functional activity tolerance. Patient received in bed, completed bed mobility CGA. Completed sit to stand CGA to min. Functional mobility 150 ft with RW CGA. OT utilized therapeutic use of self with location change to increase patients mood and encouragement to participate. Patient completed wc mobility with UE for strengthening and activity tolerance 40 ft. Patient returned to bed alarm on all needs in reach.   Therapy Documentation Precautions:  Precautions Precautions: Fall Recall of Precautions/Restrictions: Intact Restrictions Weight Bearing Restrictions Per Provider Order: No  Therapy/Group: Individual Therapy  D'mariea L Yamaira Spinner 06/14/2024, 6:46 AM

## 2024-06-14 NOTE — Group Note (Signed)
 Patient Details Name: [name] Mathews MRN: [id number] DOB: 12/04/1933 Today's Date: 06/14/2024  Time Calculation: OT Group Time Calculation OT Group Start Time: 9094 OT Group Stop Time: 1005 OT Group Time Calculation (min): 60 min      Group Description: BUE Therex Group: Pt participated in group session with a focus on BUE strength and endurance to facilitate improved activity tolerance and strength for higher level BADLs and functional mobility tasks.   Individual level documentation: Pt first in engaged in seated warm up where pt completed various UE stretches to promote improved ROM.  Pt engaged in seated therapeutic activity game where pts were instructed to roll large dice to see what number they rolled, once a number was determined each number correlated to an UB exercise and a number of reps, exercises included bicep curls, tricep extensions, upright rows, flys, chest presses and punches. Repetitions ranged from 4-20. Pt chose to use 1 lb weights during session. Education provided during activity of various modifications for all exercises. Education provided on the importance of deep breathing as well as determining each pts activity tolerance.   All pts received BUE HEP with pts completing below BUE therex with level 2 theraband.  X10 shoulder flexion  X10 bicep curls X10 shoulder horizontal ABD X10 shoulder diagonal pulls X10 shoulder extension    Issued pt written HEP to increase carryover   Pts transported back to room by RT.   Pain: 6/10 pain reported in R hip, rest breaks provided as needed.    [facility] 06/14/2024, 12:15 PM

## 2024-06-15 DIAGNOSIS — R5381 Other malaise: Secondary | ICD-10-CM | POA: Diagnosis not present

## 2024-06-15 LAB — BASIC METABOLIC PANEL WITH GFR
Anion gap: 9 (ref 5–15)
BUN: 23 mg/dL (ref 8–23)
CO2: 23 mmol/L (ref 22–32)
Calcium: 8.4 mg/dL — ABNORMAL LOW (ref 8.9–10.3)
Chloride: 107 mmol/L (ref 98–111)
Creatinine, Ser: 1.56 mg/dL — ABNORMAL HIGH (ref 0.44–1.00)
GFR, Estimated: 32 mL/min — ABNORMAL LOW (ref >60)
Glucose, Bld: 119 mg/dL — ABNORMAL HIGH (ref 70–99)
Potassium: 3.9 mmol/L (ref 3.5–5.1)
Sodium: 139 mmol/L (ref 135–145)

## 2024-06-15 LAB — GLUCOSE, CAPILLARY: Glucose-Capillary: 161 mg/dL — ABNORMAL HIGH (ref 70–99)

## 2024-06-15 NOTE — Plan of Care (Signed)
  Problem: RH BOWEL ELIMINATION Goal: RH STG MANAGE BOWEL WITH ASSISTANCE Description: STG Manage Bowel with Assistance. Outcome: Progressing   Problem: RH BLADDER ELIMINATION Goal: RH STG MANAGE BLADDER WITH ASSISTANCE Description: STG Manage Bladder With minimal  Assistance Outcome: Progressing   Problem: RH SKIN INTEGRITY Goal: RH STG SKIN FREE OF INFECTION/BREAKDOWN Description: Manage skin free of infection/breakdown with minimal assistance Outcome: Progressing   Problem: RH SAFETY Goal: RH STG ADHERE TO SAFETY PRECAUTIONS W/ASSISTANCE/DEVICE Description: STG Adhere to Safety Precautions With minimal assistance Assistance/Device. Outcome: Progressing

## 2024-06-15 NOTE — Progress Notes (Signed)
 PROGRESS NOTE   Subjective/Complaints: No new complaints this morning Daughter requests that melatonin be d/ced as she has been sleeping well Eating lunch  ROS: Patient denies chills, rash, vision changes, nausea, vomiting, diarrhea, shortness of breath, abdominal pain, chest pain +Rt hip pain -improved  Objective:   No results found. Recent Labs    06/13/24 0432  WBC 6.1  HGB 8.4*  HCT 25.2*  PLT 219   Recent Labs    06/13/24 0432 06/15/24 0540  NA 138 139  K 3.8 3.9  CL 106 107  CO2 22 23  GLUCOSE 101* 119*  BUN 25* 23  CREATININE 1.58* 1.56*  CALCIUM  8.4* 8.4*    Intake/Output Summary (Last 24 hours) at 06/15/2024 1532 Last data filed at 06/15/2024 1407 Gross per 24 hour  Intake 350 ml  Output --  Net 350 ml        Physical Exam: Vital Signs Blood pressure (!) 149/47, pulse (!) 57, temperature 97.8 F (36.6 C), temperature source Oral, resp. rate 16, height 5' 7 (1.702 m), weight 98.8 kg, SpO2 100%.  General: No acute distress, working in group therapy HEENT: NCAT, EOMI, oral membranes moist Cards: Bradycardic Chest: Clear to auscultation bilaterally, nonlabored breathing  abdomen: Soft, NT, ND, + BS normal active Skin: No breakdown noted on visible portion of skin Extremities: no edema Psych: pleasant and appropriate  Skin: C/D/I. No apparent lesions.  PRIOR EXAMS: MSK:      No apparent deformity.  Pain with internal and external rotation of right hip.  No significant greater trochanter tenderness noted.       Neurologic exam:  Cognition: AAO x4 Language: Fluent Mood: Very pleasant Sensation: To light touch intact in BL UEs and LEs  CN: 2-12 grossly intact.  Coordination: Bilateral lower extremity tremors with exertion      Strength: Bilateral upper extremities 4/5 proximally, 5/5 distally      Bilateral lower extremities 4-/5 proximally, 4/5 distally Prior neuro assessment is c/w  today's exam 06/15/2024.   Assessment/Plan: 1. Functional deficits which require 3+ hours per day of interdisciplinary therapy in a comprehensive inpatient rehab setting. Physiatrist is providing close team supervision and 24 hour management of active medical problems listed below. Physiatrist and rehab team continue to assess barriers to discharge/monitor patient progress toward functional and medical goals  Care Tool:  Bathing    Body parts bathed by patient: Right arm, Left arm, Chest, Abdomen, Right upper leg, Left upper leg, Right lower leg, Left lower leg, Face     Body parts n/a: Buttocks, Front perineal area, Right lower leg, Left lower leg   Bathing assist Assist Level: Moderate Assistance - Patient 50 - 74%     Upper Body Dressing/Undressing Upper body dressing   What is the patient wearing?: Pull over shirt    Upper body assist Assist Level: Minimal Assistance - Patient > 75%    Lower Body Dressing/Undressing Lower body dressing      What is the patient wearing?: Underwear/pull up     Lower body assist Assist for lower body dressing: Maximal Assistance - Patient 25 - 49%     Toileting Toileting    Toileting assist  Assist for toileting: Moderate Assistance - Patient 50 - 74%     Transfers Chair/bed transfer  Transfers assist  Chair/bed transfer activity did not occur: Safety/medical concerns  Chair/bed transfer assist level: Minimal Assistance - Patient > 75%     Locomotion Ambulation   Ambulation assist      Assist level: Minimal Assistance - Patient > 75% Assistive device: Walker-rolling Max distance: 97'   Walk 10 feet activity   Assist     Assist level: Contact Guard/Touching assist Assistive device: Walker-rolling   Walk 50 feet activity   Assist Walk 50 feet with 2 turns activity did not occur: Safety/medical concerns  Assist level: Minimal Assistance - Patient > 75% Assistive device: Walker-rolling    Walk 150 feet  activity   Assist Walk 150 feet activity did not occur: Safety/medical concerns (fatigue)         Walk 10 feet on uneven surface  activity   Assist Walk 10 feet on uneven surfaces activity did not occur: Safety/medical concerns         Wheelchair     Assist Is the patient using a wheelchair?: Yes Type of Wheelchair: Manual    Wheelchair assist level: Dependent - Patient 0%      Wheelchair 50 feet with 2 turns activity    Assist        Assist Level: Dependent - Patient 0%   Wheelchair 150 feet activity     Assist      Assist Level: Dependent - Patient 0%   Blood pressure (!) 149/47, pulse (!) 57, temperature 97.8 F (36.6 C), temperature source Oral, resp. rate 16, height 5' 7 (1.702 m), weight 98.8 kg, SpO2 100%.  Medical Problem List and Plan: 1. Functional deficits secondary to debility s/p UTI             -patient may shower             -ELOS/Goals: 21-24 days, Min A PT/OT             -Continue CIR therapies including PT, OT    - Expected discharge 8/28 although may be adjusted to 8/30, family out of town.  Will discuss with team  2.  Antithrombotics: -DVT/anticoagulation:  Pharmaceutical: continue Lovenox              -antiplatelet therapy: N/A  3. Pain Management: continue Tylenol  prn.   -8/13 patient like to avoid stronger pain medications.  Having some right hip pain after stairs.  Will try Tylenol  and heating pad for now.  8/14 hip pain doing a little better, continue current regimen for now, we will see how she does with therapy  8/15 discussed scheduling some Tylenol  before therapy, patient declines for now.  She reports overall controlled  4. Mood/Behavior/Sleep: LCSW to follow for evaluation and support.              -antipsychotic agents: N/A 5. Neuropsych/cognition: This patient is not fully capable of making decisions on her own behalf. 6. Skin/Wound Care: Routine pressure relief meausre.  7. Fluids/Electrolytes/Nutrition:  Monitor I/O. Check CMET in am.  8.HTN: Monitor BP TID- continue amlodipine  and hydralazine .     06/15/2024    1:14 PM 06/15/2024    5:34 AM 06/14/2024    8:28 PM  Vitals with BMI  Systolic 149 155 851  Diastolic 47 60 57  Pulse 57    8/9 increase hydralazine  to 37.5mg  BID 8/10 reports patient had been advised to take amlodipine  at night,  medication timing adjusted.  She was also taking nebivolol .  Will hold off on adding this today as we are changing amlodipine  timing.  If BP still elevated tomorrow consider adding low-dose Nebivolol  8/12 will restart Nebivolol  at 2.5 mg  8/13 increase Nebivolol  to 5mg  8/14 BP controlled doing better overall, continue current regimen and monitor 9. Anemia of chronic disease: Negative work up by Dr. Freddie and Dr Lanny last month.     - Appears to be gradually trending down on review on records. Check FOBT  -8/13 hemoglobin overall stable at 8.4, discussed FOBT nursing team  -8/14 hemoglobin remained stable at 8.4  Continue to monitor for FOBT but overall appears stable 10. T2DM: Diet controlled- Monitor BS ac/hs.  8/15 fair control, continue to monitor CBG (last 3)  Recent Labs    06/14/24 1640 06/14/24 2157 06/15/24 1102  GLUCAP 164* 156* 161*    11. H/o UTIs/Urinary retention: Question due to Merbetriq--d/c a couple of days ago?  Appears this.  For urinary retention.  Foley replaced. Will remove and start bladder program             -monitor voiding with PVR/bladder scan.   -Will ask nursing to get some PVRs  8/14 I see 1 bladder scan yesterday for  433 mL, unsure if this is before or after she voided.  Will asked nursing to document PVRs  8/15 bladder scan of 270, looks like she had to void a few minutes prior.  She is currently on Flomax .  Will asked nursing to check a few more.  If continues consider Urecholine  12. Dementia: Used to be on On Galantamine  and now on Donepezil .  13. Constipation: Had enema yesterday after no BM X one week  -  8/10 lactulose  changed from daily to as needed, had a large bowel movement yesterday  8/11 last bm today. Pt/daughter satisfied with current regimen   8/13 LBM documented 6/11, will add prn Miralax  for mild constipation, encourage oral fluid intake  8/15 LBM yesterday 14.  CKD 3b: Monitor with serial checks as well as UOP/voiding Cr at [facility] 7/29: 1.61 7/30: 1.52 7/31: 1.47 8/1: 1.89 8/2: 1.50 8/3: 1.36  8/4  1.34 8/5 1.32 8/7 1.30   - 8/9 creatinine 1.39, overall stable continue to monitor   8/11-a slight bump in BUN/Cr today (20/1.52).  I'm not too concerned at present. WIll encourage PO and follow up lab work on Wednesday.   8/14 Cr little higher at 1.58/BUN 25, still around overall baseline, continue encourage oral fluids again today  8/15 discussed oral fluid intake, continue to monitor labs.  If increases further consider some short-term IV fluids  8/16 Cr reviewed and is mildly improved 15. Obesity. Class 2. Dietary counseling   -Body mass index is 34.11 kg/m.     LOS: 8 days A FACE TO FACE EVALUATION WAS PERFORMED  Sven SQUIBB Kaileena Obi 06/15/2024, 3:32 PM

## 2024-06-15 NOTE — Progress Notes (Signed)
 Occupational Therapy Session Note  Patient Details  Name: [name] Mathews MRN: [id number] Date of Birth: 12-08-1933  Today's Date: 06/15/2024 OT Individual Time: 9754-9654 OT Individual Time Calculation (min): 60 min    Short Term Goals: Week 1:  OT Short Term Goal 1 (Week 1): patient will maintain balance during ADLs with CGA for LB ADLS OT Short Term Goal 1 - Progress (Week 1): Met OT Short Term Goal 2 (Week 1): patient will complete peri hygiene with min A OT Short Term Goal 2 - Progress (Week 1): Partly met (able to complete front peri hygiene, assist for posterior) OT Short Term Goal 3 (Week 1): patient will don socks with min A with AE OT Short Term Goal 3 - Progress (Week 1): Met  Skilled Therapeutic Interventions/Progress Updates:    Patient in the recliner at the time of treatment with family present. Patient reported a pain response of 6 on 0-10 for R hip  pain.  The patient went on to say that she didn't inform nursing, I indicated that I would make nursing aware.  The pt reported sleeping well during the night. The pt was in agreement with completing UB exercise, sit to stands, and a BADL related task . The pt was able to complete 1 set of 10 for shld flexion, horizontal abduction, and pull ups using a yellow theraband.  The pt was encouraged to complete relaxation breathing to improve compliance.  The pt went on to complete sit to stand 4x with 2 rest breaks incorporating relaxation breathing. The pt indicated that she needed to go to the restroom and was able to come from sit to stand using the RW and the arm of the recliner with ModAx1 transitioning to MinA.  The pt was then able to ambulate to the restroom with CGA, additional time and, vc's for her position in relation to the RW.  The pt was able to doff her LB garments, consisting of  a brief with ModA. The pt was able to lower herself to the commode using the grab bars  and the arm of the 3n1 commode for additional balance. The pt  was able to come from sit to stand with ModA using the armrest  of the 3 n 1 for additional balance. The pt was able to complete peri care for front and back with MinA. The pt was able to return to sit for donning her brief using the long handle reacher for implementing  opposite arm opposite leg for > range at ModA for coordination. The pt was able to come into standing with CGA for  bring the brief over her  hips and bottom with ModA. The pt was able to change her hospital gown with ModA. The pt was able to ambulate to the sink for washing her hands using the RW with MinA and additional time. The pt was able ambulate to EOB with MinA, she was able to transition from standing to sit with MinA. The pt was able to bring BLE onto the bed with MinA. The pt's call light and bedside table were placed within reach with all additional needs addressed prior to exiting the room.   Therapy Documentation Precautions:  Precautions Precautions: Fall Recall of Precautions/Restrictions: Intact Restrictions Weight Bearing Restrictions Per Provider Order: No   Therapy/Group: Individual Therapy  Elvera JONETTA Mace 06/15/2024, 4:13 PM

## 2024-06-16 DIAGNOSIS — R5381 Other malaise: Secondary | ICD-10-CM | POA: Diagnosis not present

## 2024-06-16 LAB — GLUCOSE, CAPILLARY
Glucose-Capillary: 117 mg/dL — ABNORMAL HIGH (ref 70–99)
Glucose-Capillary: 192 mg/dL — ABNORMAL HIGH (ref 70–99)
Glucose-Capillary: 190 mg/dL — ABNORMAL HIGH (ref 70–99)
Glucose-Capillary: 171 mg/dL — ABNORMAL HIGH (ref 70–99)

## 2024-06-16 MED ORDER — AMLODIPINE BESYLATE 5 MG PO TABS
5.0000 mg | ORAL_TABLET | Freq: Every evening | ORAL | Status: DC
  Administered 2024-06-16 – 2024-06-17 (×2): 5 mg via ORAL
  Filled 2024-06-16 (×2): qty 1

## 2024-06-16 NOTE — Plan of Care (Signed)
  Problem: RH BOWEL ELIMINATION Goal: RH STG MANAGE BOWEL WITH ASSISTANCE Description: STG Manage Bowel with Assistance. Outcome: Progressing   Problem: RH BLADDER ELIMINATION Goal: RH STG MANAGE BLADDER WITH ASSISTANCE Description: STG Manage Bladder With minimal  Assistance Outcome: Progressing   Problem: RH SKIN INTEGRITY Goal: RH STG SKIN FREE OF INFECTION/BREAKDOWN Description: Manage skin free of infection/breakdown with minimal assistance Outcome: Progressing   Problem: RH SAFETY Goal: RH STG ADHERE TO SAFETY PRECAUTIONS W/ASSISTANCE/DEVICE Description: STG Adhere to Safety Precautions With minimal assistance Assistance/Device. Outcome: Progressing

## 2024-06-16 NOTE — Progress Notes (Signed)
 PROGRESS NOTE   Subjective/Complaints: No new complaints this morning Patient's chart reviewed- No issues reported overnight Distolic BP is soft, will decrease amlodipine  to 5mg   ROS: Patient denies chills, rash, vision changes, nausea, vomiting, diarrhea, shortness of breath, abdominal pain, chest pain +Rt hip pain -improved  Objective:   No results found. No results for input(s): WBC, HGB, HCT, PLT in the last 72 hours.  Recent Labs    06/15/24 0540  NA 139  K 3.9  CL 107  CO2 23  GLUCOSE 119*  BUN 23  CREATININE 1.56*  CALCIUM  8.4*    Intake/Output Summary (Last 24 hours) at 06/16/2024 1442 Last data filed at 06/16/2024 9178 Gross per 24 hour  Intake 237 ml  Output --  Net 237 ml        Physical Exam: Vital Signs Blood pressure (!) 136/49, pulse (!) 58, temperature 98.2 F (36.8 C), temperature source Oral, resp. rate 16, height 5' 7 (1.702 m), weight 98.8 kg, SpO2 100%.  General: No acute distress, sitting in chair HEENT: NCAT, EOMI, oral membranes moist Cards: Bradycardic Chest: Clear to auscultation bilaterally, nonlabored breathing  abdomen: Soft, NT, ND, + BS normal active Skin: No breakdown noted on visible portion of skin Extremities: no edema Psych: pleasant and appropriate  Skin: C/D/I. No apparent lesions.  PRIOR EXAMS: MSK:      No apparent deformity.  Pain with internal and external rotation of right hip.  No significant greater trochanter tenderness noted.       Neurologic exam:  Cognition: AAO x4 Language: Fluent Mood: Very pleasant Sensation: To light touch intact in BL UEs and LEs  CN: 2-12 grossly intact.  Coordination: Bilateral lower extremity tremors with exertion      Strength: Bilateral upper extremities 4/5 proximally, 5/5 distally      Bilateral lower extremities 4-/5 proximally, 4/5 distally Prior neuro assessment is c/w today's exam  06/16/2024.   Assessment/Plan: 1. Functional deficits which require 3+ hours per day of interdisciplinary therapy in a comprehensive inpatient rehab setting. Physiatrist is providing close team supervision and 24 hour management of active medical problems listed below. Physiatrist and rehab team continue to assess barriers to discharge/monitor patient progress toward functional and medical goals  Care Tool:  Bathing    Body parts bathed by patient: Right arm, Left arm, Chest, Abdomen, Right upper leg, Left upper leg, Right lower leg, Left lower leg, Face     Body parts n/a: Buttocks, Front perineal area, Right lower leg, Left lower leg   Bathing assist Assist Level: Moderate Assistance - Patient 50 - 74%     Upper Body Dressing/Undressing Upper body dressing   What is the patient wearing?: Pull over shirt    Upper body assist Assist Level: Minimal Assistance - Patient > 75%    Lower Body Dressing/Undressing Lower body dressing      What is the patient wearing?: Underwear/pull up     Lower body assist Assist for lower body dressing: Maximal Assistance - Patient 25 - 49%     Toileting Toileting    Toileting assist Assist for toileting: Moderate Assistance - Patient 50 - 74%     Transfers Chair/bed  transfer  Transfers assist  Chair/bed transfer activity did not occur: Safety/medical concerns  Chair/bed transfer assist level: Minimal Assistance - Patient > 75%     Locomotion Ambulation   Ambulation assist      Assist level: Minimal Assistance - Patient > 75% Assistive device: Walker-rolling Max distance: 97'   Walk 10 feet activity   Assist     Assist level: Contact Guard/Touching assist Assistive device: Walker-rolling   Walk 50 feet activity   Assist Walk 50 feet with 2 turns activity did not occur: Safety/medical concerns  Assist level: Minimal Assistance - Patient > 75% Assistive device: Walker-rolling    Walk 150 feet  activity   Assist Walk 150 feet activity did not occur: Safety/medical concerns (fatigue)         Walk 10 feet on uneven surface  activity   Assist Walk 10 feet on uneven surfaces activity did not occur: Safety/medical concerns         Wheelchair     Assist Is the patient using a wheelchair?: Yes Type of Wheelchair: Manual    Wheelchair assist level: Dependent - Patient 0%      Wheelchair 50 feet with 2 turns activity    Assist        Assist Level: Dependent - Patient 0%   Wheelchair 150 feet activity     Assist      Assist Level: Dependent - Patient 0%   Blood pressure (!) 136/49, pulse (!) 58, temperature 98.2 F (36.8 C), temperature source Oral, resp. rate 16, height 5' 7 (1.702 m), weight 98.8 kg, SpO2 100%.  Medical Problem List and Plan: 1. Functional deficits secondary to debility s/p UTI             -patient may shower             -ELOS/Goals: 21-24 days, Min A PT/OT             -Continue CIR therapies including PT, OT    - Expected discharge 8/28 although may be adjusted to 8/30, family out of town.  Will discuss with team  2.  Antithrombotics: -DVT/anticoagulation:  Pharmaceutical: continue Lovenox              -antiplatelet therapy: N/A  3. Pain Management: continue Tylenol  prn.   -8/13 patient like to avoid stronger pain medications.  Having some right hip pain after stairs.  Will try Tylenol  and heating pad for now.  8/14 hip pain doing a little better, continue current regimen for now, we will see how she does with therapy  8/15 discussed scheduling some Tylenol  before therapy, patient declines for now.  She reports overall controlled  4. Mood/Behavior/Sleep: LCSW to follow for evaluation and support.              -antipsychotic agents: N/A 5. Neuropsych/cognition: This patient is not fully capable of making decisions on her own behalf. 6. Skin/Wound Care: Routine pressure relief meausre.  7. Fluids/Electrolytes/Nutrition:  Monitor I/O. Check CMET in am.  8.HTN: Monitor BP TID- continue amlodipine  and hydralazine .     06/16/2024    2:26 PM 06/16/2024    4:29 AM 06/15/2024    9:07 PM  Vitals with BMI  Systolic 136 145 872  Diastolic 49 55 53  Pulse 58 60   8/9 increase hydralazine  to 37.5mg  BID 8/10 reports patient had been advised to take amlodipine  at night, medication timing adjusted.  She was also taking nebivolol .  Will hold off on adding this  today as we are changing amlodipine  timing.  If BP still elevated tomorrow consider adding low-dose Nebivolol  8/12 will restart Nebivolol  at 2.5 mg  8/13 increase Nebivolol  to 5mg  8/14 BP controlled doing better overall, continue current regimen and monitor  8/17: decrease amlodipine  to 5mg  given diastolic hypotension 9. Anemia of chronic disease: Negative work up by Dr. Freddie and Dr Lanny last month.     - Appears to be gradually trending down on review on records. Check FOBT  -8/13 hemoglobin overall stable at 8.4, discussed FOBT nursing team  -8/14 hemoglobin remained stable at 8.4  Continue to monitor for FOBT but overall appears stable 10. T2DM: Diet controlled- Monitor BS ac/hs.  8/15 fair control, continue to monitor CBG (last 3)  Recent Labs    06/15/24 1102 06/16/24 0601 06/16/24 1144  GLUCAP 161* 117* 192*    11. H/o UTIs/Urinary retention: Question due to Merbetriq--d/c a couple of days ago?  Appears this.  For urinary retention.  Foley replaced. Will remove and start bladder program             -monitor voiding with PVR/bladder scan.   -Will ask nursing to get some PVRs  8/14 I see 1 bladder scan yesterday for  433 mL, unsure if this is before or after she voided.  Will asked nursing to document PVRs  8/15 bladder scan of 270, looks like she had to void a few minutes prior.  She is currently on Flomax .  Will asked nursing to check a few more.  If continues consider Urecholine   Will check with nursing status of urination  12. Dementia:  Continue Donepezil .   13. Constipation: Had enema yesterday after no BM X one week  - 8/10 lactulose  changed from daily to as needed, had a large bowel movement yesterday  8/11 last bm today. Pt/daughter satisfied with current regimen   8/13 LBM documented 6/11, will add prn Miralax  for mild constipation, encourage oral fluid intake  8/15 LBM yesterday  Messaged nursing to check accuracy of last documented BP 14.  CKD 3b: Monitor with serial checks as well as UOP/voiding Cr at [facility] 7/29: 1.61 7/30: 1.52 7/31: 1.47 8/1: 1.89 8/2: 1.50 8/3: 1.36  8/4  1.34 8/5 1.32 8/7 1.30   - 8/9 creatinine 1.39, overall stable continue to monitor   8/11-a slight bump in BUN/Cr today (20/1.52).  I'm not too concerned at present. WIll encourage PO and follow up lab work on Wednesday.   8/14 Cr little higher at 1.58/BUN 25, still around overall baseline, continue encourage oral fluids again today  8/15 discussed oral fluid intake, continue to monitor labs.  If increases further consider some short-term IV fluids  8/16 Cr reviewed and is mildly improved  15. Obesity. Class 2. Dietary counseling   -Body mass index is 34.11 kg/m.     LOS: 9 days A FACE TO FACE EVALUATION WAS PERFORMED  Sven P Mekia Dipinto 06/16/2024, 2:42 PM

## 2024-06-16 NOTE — Progress Notes (Incomplete)
 Physical Therapy Weekly Progress Note  Patient Details  Name: [name] Mathews MRN: [id number] Date of Birth: Sep 07, 1934  Beginning of progress report period: {Time; dates multiple:304500300} End of progress report period: {Time; dates multiple:304500300}  {CHL IP REHAB PT TIME CALCULATION:304800500}  Patient has met {number 1-5:22450} of {number 1-5:20334} short term goals.  ***  Patient continues to demonstrate the following deficits {impairments:3041632} and therefore will continue to benefit from skilled PT intervention to increase functional independence with mobility.  Patient {LTG progression:3041653}.  {plan of rjmz:6958345}  PT Short Term Goals {DUH:6958314}  Skilled Therapeutic Interventions/Progress Updates:      Therapy Documentation Precautions:  Precautions Precautions: Fall Recall of Precautions/Restrictions: Intact Restrictions Weight Bearing Restrictions Per Provider Order: No General:   Vital Signs: Therapy Vitals Temp: 98.9 F (37.2 C) Temp Source: Oral Pulse Rate: 60 Resp: 18 BP: (!) 145/55 Patient Position (if appropriate): Lying Oxygen Therapy SpO2: 100 % O2 Device: Room Air Pain:   Vision/Perception     Mobility:   Locomotion :    Trunk/Postural Assessment :    Balance:   Exercises:   Other Treatments:     Therapy/Group: {Therapy/Group:3049007}  [name] 06/16/2024, 7:47 AM

## 2024-06-17 DIAGNOSIS — E1122 Type 2 diabetes mellitus with diabetic chronic kidney disease: Secondary | ICD-10-CM | POA: Diagnosis not present

## 2024-06-17 DIAGNOSIS — R5381 Other malaise: Secondary | ICD-10-CM | POA: Diagnosis not present

## 2024-06-17 DIAGNOSIS — I10 Essential (primary) hypertension: Secondary | ICD-10-CM | POA: Diagnosis not present

## 2024-06-17 DIAGNOSIS — K5900 Constipation, unspecified: Secondary | ICD-10-CM | POA: Diagnosis not present

## 2024-06-17 LAB — GLUCOSE, CAPILLARY
Glucose-Capillary: 121 mg/dL — ABNORMAL HIGH (ref 70–99)
Glucose-Capillary: 196 mg/dL — ABNORMAL HIGH (ref 70–99)
Glucose-Capillary: 114 mg/dL — ABNORMAL HIGH (ref 70–99)
Glucose-Capillary: 112 mg/dL — ABNORMAL HIGH (ref 70–99)
Glucose-Capillary: 159 mg/dL — ABNORMAL HIGH (ref 70–99)
Glucose-Capillary: 133 mg/dL — ABNORMAL HIGH (ref 70–99)
Glucose-Capillary: 168 mg/dL — ABNORMAL HIGH (ref 70–99)

## 2024-06-17 LAB — BASIC METABOLIC PANEL WITH GFR
Anion gap: 8 (ref 5–15)
BUN: 21 mg/dL (ref 8–23)
CO2: 24 mmol/L (ref 22–32)
Calcium: 8.5 mg/dL — ABNORMAL LOW (ref 8.9–10.3)
Chloride: 105 mmol/L (ref 98–111)
Creatinine, Ser: 1.62 mg/dL — ABNORMAL HIGH (ref 0.44–1.00)
GFR, Estimated: 30 mL/min — ABNORMAL LOW (ref >60)
Glucose, Bld: 104 mg/dL — ABNORMAL HIGH (ref 70–99)
Potassium: 4 mmol/L (ref 3.5–5.1)
Sodium: 137 mmol/L (ref 135–145)

## 2024-06-17 LAB — CBC
HCT: 25.8 % — ABNORMAL LOW (ref 36.0–46.0)
Hemoglobin: 8.5 g/dL — ABNORMAL LOW (ref 12.0–15.0)
MCH: 29.5 pg (ref 26.0–34.0)
MCHC: 32.9 g/dL (ref 30.0–36.0)
MCV: 89.6 fL (ref 80.0–100.0)
Platelets: 220 K/uL (ref 150–400)
RBC: 2.88 MIL/uL — ABNORMAL LOW (ref 3.87–5.11)
RDW: 15 % (ref 11.5–15.5)
WBC: 6.8 K/uL (ref 4.0–10.5)
nRBC: 0 % (ref 0.0–0.2)

## 2024-06-17 MED ORDER — SODIUM CHLORIDE 0.9 % IV SOLN: INTRAVENOUS | Status: DC

## 2024-06-17 MED ORDER — ACETAMINOPHEN 325 MG PO TABS
650.0000 mg | ORAL_TABLET | Freq: Every day | ORAL | Status: DC
  Administered 2024-06-18 – 2024-06-29 (×12): 650 mg via ORAL
  Filled 2024-06-17 (×12): qty 2

## 2024-06-17 NOTE — Progress Notes (Signed)
 Physical Therapy Weekly Progress Note  Patient Details  Name: [name] Mathews MRN: [id number] Date of Birth: [birth year]  Beginning of progress report period: June 08, 2024 End of progress report period: June 17, 2024  Today's Date: 06/17/2024 PT Individual Time: 1007-1047, 1415-1520 PT Individual Time Calculation (min): 40 min, 65 min PT missed minutes: 10 min (fatigue)   Patient has met 4 of 4 short term goals.  Pt is making great progress towards long term goals. Bed mobility with use of bed features and increased time with supervision/mod I. Sit to stand/stand pivot transfer with RW and CGA/min A. Gait x107 feet with RW and CGA/min A (with fatigue). Pt heavily dependent on B UE support on RW with heavy trunk flexion. Stair navigation x 12 6 inch steps with B handrials and CGA with step to gait. Pt overall level of assist fluctuates with pain/fatigue. D/C scheduled for 8/30 to progress endurance/activity tolerance/stair navigation for access to bed room/bathroom. Biggest barrier to D/C is pt stairs for access to bedroom and bathroom.   Patient continues to demonstrate the following deficits muscle weakness and muscle joint tightness, decreased cardiorespiratoy endurance, decreased memory, and decreased standing balance and decreased balance strategies and therefore will continue to benefit from skilled PT intervention to increase functional independence with mobility.  Patient progressing toward long term goals..  Continue plan of care.  PT Short Term Goals Week 1:  PT Short Term Goal 1 (Week 1): Pt will complete bed mobility with min assist PT Short Term Goal 1 - Progress (Week 1): Met PT Short Term Goal 2 (Week 1): Pt will complete sit to stand with min assist consistently with LRAD PT Short Term Goal 2 - Progress (Week 1): Met PT Short Term Goal 3 (Week 1): Pt will ambulate 17' with min assist and LRAD PT Short Term Goal 3 - Progress (Week 1): Met PT Short Term Goal 4 (Week 1): Pt  will complete up/down one 3 step with BHRs min assist PT Short Term Goal 4 - Progress (Week 1): Met Week 2:  PT Short Term Goal 1 (Week 2): pt will navigate 8 6 inch steps with B HR and min A PT Short Term Goal 2 (Week 2): pt perform sit to stand with LRAD and CGA consistently PT Short Term Goal 3 (Week 2): pt will ambulate 150 feet with LRAD and CGA  Skilled Therapeutic Interventions/Progress Updates:      Treatment Session 1   Pt supine in bed upon arrival. Pt agreeable to therapy. Pt reports R hip pain 6/10, nurse present at start of session to administer medication.   Pt performed sit to stand with min A from EOB with RW, verbal cues provided for B UE positioning and anterior weight shift.   Pt ambulated 1x93, 1x107 feet with RW and CGA/min A (with fatigue) with requires max progressing to mod verbal cues for safety with RW 2/2 heavy trunk lean and reliance on B UE support on RW however pt demonstrates improved carry over with adjusted height of RW (PT increased height of RW for improved facilitation of upright posture).   Pt performed sit to stand from recliner with no AD and R HHA x5 with mod progressing to light min A, verbal cues provided for UE positioning.  Pt seated in recliner at end of session with all needs within reach and chair alarm on.   Treatment Session 2   Pt supine in bed upon arrival. Pt agreeable to therapy. Pt denies any pain  but endorses increased fatigue. With encouragement pt agreeable to getting out of bed.   Pt ambulated to bathroom with RW and CGA, verbal cues provided for safety with RW. Pt continent of bladder. Pt donned/doffed pants while standing with CGA, however pt required assistance to fasten and zip pants.   Pt performed sit to stand with RW and +++ time with CGA, verbal cues provided for UE positioning.   Discussed with daughter possible alternative options for pt access to bed room and bathroom 2/2 concerns with pt ability to navigate two  flights of stairs. Pt daughter provided picture of steps and there are 17 steps total (9 steps, landing, 8 steps). Education provided regarding concerns with pt hip pain, and endurance/activity tolerance. Discussed alternative possibilities including: moving pt bedroom to dining room/living room and utilizing master bathroom downstairs, installing stair lift, or pt discharging to other daughters home. Pt daughter plans to discuss with her family alternate options. Plan to continue to progress pt mobility/endurance.   Pt navigated 12 6 inch steps with B handrail and CGA with step to gait, with therapist providing encouragement. Pt required seated rest break 2/2 fatigue/SOB, RA SpO2 100%.   Pt supine in bed at end of session with all needs within reahc and bed alram on.   Pt missed 10 min 2/2 fatigue. Will attempt to make up missed minutes as able.   Therapy Documentation Precautions:  Precautions Precautions: Fall Recall of Precautions/Restrictions: Intact Restrictions Weight Bearing Restrictions Per Provider Order: No  Therapy/Group: Individual Therapy  [facility] [name], [place], DPT  06/17/2024, 7:55 AM

## 2024-06-17 NOTE — Progress Notes (Signed)
 Occupational Therapy Session Note  Patient Details  Name: [name] Mathews MRN: [id number] Date of Birth: 14-Jan-1934  Today's Date: 06/17/2024 OT Individual Time: 9164-9050 OT Individual Time Calculation (min): 74 min    Short Term Goals: Week 2:  OT Short Term Goal 1 (Week 2): patient will complete showering with CGA OT Short Term Goal 2 (Week 2): patient will tolerate functional activity for 10 minutes  with 2 or fewer rests to increase activity tolerance.  Skilled Therapeutic Interventions/Progress Updates:  Pt greeted supine in bed, pt agreeable to OT intervention.      Transfers/bed mobility/functional mobility: pt completed supine>sit with CGA and increased time with use of bed features. Pt completed sit>stand from EOB with MOD A, assist needed to fully elevate trunk and straighten out knees. Pt completed ambulatory transfer into bathroom with RW and MINA, cues and assist needed for RW proximity and mgmt.    ADLs:  Grooming: pt attempted to stand for oral care but d/t flexed posture opted to sit for energy conservation and safety.  UB dressing:pt donned bra and [place] shirt with set- up assist.  LB dressing: donned brief and pants with MODA to thread each item  Footwear: donned socks with total A  Bathing: pt completed bathing from sitting/standing with CGA Transfers: pt completed ambulatory ADL transfers with RW and CGA Toileting: pt unable to void and required MAX A for 3/3 toileting tasks                Ended session with pt supine in bed with all needs within reach and bed alarm activated.                    Therapy Documentation Precautions:  Precautions Precautions: Fall Recall of Precautions/Restrictions: Intact Restrictions Weight Bearing Restrictions Per Provider Order: No  Pain: unrated pain reported in BLEs, restbreaks provided, requested pain meds during session     Therapy/Group: Individual Therapy  Ronal Gift [facility] 06/17/2024, 11:57 AM

## 2024-06-17 NOTE — Progress Notes (Signed)
 PROGRESS NOTE   Subjective/Complaints: Patient working with therapy this morning.  Reports continued hip pain, does not want to take a lot of medications.  No additional concerns elicited.  ROS: Patient denies chills, rash, vision changes, nausea, vomiting, diarrhea, shortness of breath, abdominal pain, chest pain +Rt hip pain -continued  Objective:   No results found. Recent Labs    06/17/24 0539  WBC 6.8  HGB 8.5*  HCT 25.8*  PLT 220    Recent Labs    06/15/24 0540 06/17/24 0539  NA 139 137  K 3.9 4.0  CL 107 105  CO2 23 24  GLUCOSE 119* 104*  BUN 23 21  CREATININE 1.56* 1.62*  CALCIUM  8.4* 8.5*    Intake/Output Summary (Last 24 hours) at 06/17/2024 1133 Last data filed at 06/17/2024 0807 Gross per 24 hour  Intake 473 ml  Output --  Net 473 ml        Physical Exam: Vital Signs Blood pressure (!) 142/56, pulse 62, temperature 98.7 F (37.1 C), resp. rate 19, height 5' 7 (1.702 m), weight 98.8 kg, SpO2 98%.  General: No acute distress, working with therapy in her room HEENT: NCAT, EOMI, oral membranes a little dry Cards: Bradycardic Chest: Clear to auscultation bilaterally, nonlabored breathing  abdomen: Soft, NT, ND, + BS normal active Skin: No breakdown noted on visible portion of skin Extremities: no edema Psych: pleasant and appropriate  Skin: C/D/I. No apparent lesions.   MSK:      No apparent deformity.  Pain with internal and external rotation of right hip.         Neurologic exam:  Cognition: AAO x4 Language: Fluent Mood: Very pleasant Sensation: To light touch intact in BL UEs and LEs  CN: 2-12 grossly intact.  Coordination: Bilateral lower extremity tremors with exertion      Strength: Bilateral upper extremities 4/5 proximally, 5/5 distally      Bilateral lower extremities 4-/5 proximally, 4/5 distally Prior neuro assessment is c/w today's exam  06/17/2024.   Assessment/Plan: 1. Functional deficits which require 3+ hours per day of interdisciplinary therapy in a comprehensive inpatient rehab setting. Physiatrist is providing close team supervision and 24 hour management of active medical problems listed below. Physiatrist and rehab team continue to assess barriers to discharge/monitor patient progress toward functional and medical goals  Care Tool:  Bathing    Body parts bathed by patient: Right arm, Left arm, Chest, Abdomen, Right upper leg, Left upper leg, Right lower leg, Left lower leg, Face     Body parts n/a: Buttocks, Front perineal area, Right lower leg, Left lower leg   Bathing assist Assist Level: Moderate Assistance - Patient 50 - 74%     Upper Body Dressing/Undressing Upper body dressing   What is the patient wearing?: Pull over shirt    Upper body assist Assist Level: Minimal Assistance - Patient > 75%    Lower Body Dressing/Undressing Lower body dressing      What is the patient wearing?: Underwear/pull up     Lower body assist Assist for lower body dressing: Maximal Assistance - Patient 25 - 49%     Toileting Toileting    Toileting  assist Assist for toileting: Moderate Assistance - Patient 50 - 74%     Transfers Chair/bed transfer  Transfers assist  Chair/bed transfer activity did not occur: Safety/medical concerns  Chair/bed transfer assist level: Minimal Assistance - Patient > 75%     Locomotion Ambulation   Ambulation assist      Assist level: Contact Guard/Touching assist Assistive device: Walker-rolling Max distance: 107 feet   Walk 10 feet activity   Assist     Assist level: Contact Guard/Touching assist Assistive device: Walker-rolling   Walk 50 feet activity   Assist Walk 50 feet with 2 turns activity did not occur: Safety/medical concerns  Assist level: Contact Guard/Touching assist Assistive device: Walker-rolling    Walk 150 feet activity   Assist  Walk 150 feet activity did not occur: Safety/medical concerns (fatigue)         Walk 10 feet on uneven surface  activity   Assist Walk 10 feet on uneven surfaces activity did not occur: Safety/medical concerns         Wheelchair     Assist Is the patient using a wheelchair?: Yes Type of Wheelchair: Manual    Wheelchair assist level: Dependent - Patient 0%      Wheelchair 50 feet with 2 turns activity    Assist        Assist Level: Dependent - Patient 0%   Wheelchair 150 feet activity     Assist      Assist Level: Dependent - Patient 0%   Blood pressure (!) 142/56, pulse 62, temperature 98.7 F (37.1 C), resp. rate 19, height 5' 7 (1.702 m), weight 98.8 kg, SpO2 98%.  Medical Problem List and Plan: 1. Functional deficits secondary to debility s/p UTI             -patient may shower             -ELOS/Goals: 21-24 days, Min A PT/OT             -Continue CIR therapies including PT, OT    - Expected discharge 8/30  2.  Antithrombotics: -DVT/anticoagulation:  Pharmaceutical: continue Lovenox              -antiplatelet therapy: N/A  3. Pain Management: continue Tylenol  prn.   -8/13 patient like to avoid stronger pain medications.  Having some right hip pain after stairs.  Will try Tylenol  and heating pad for now.  8/14 hip pain doing a little better, continue current regimen for now, we will see how she does with therapy  8/15 discussed scheduling some Tylenol  before therapy, patient declines for now.  She reports overall controlled  8/18 will schedule Tylenol  in the morning, patient agreeable  4. Mood/Behavior/Sleep: LCSW to follow for evaluation and support.              -antipsychotic agents: N/A 5. Neuropsych/cognition: This patient is not fully capable of making decisions on her own behalf. 6. Skin/Wound Care: Routine pressure relief meausre.  7. Fluids/Electrolytes/Nutrition: Monitor I/O. Check CMET in am.  8.HTN: Monitor BP TID- continue  amlodipine  and hydralazine .     06/17/2024    5:22 AM 06/16/2024    8:38 PM 06/16/2024    2:26 PM  Vitals with BMI  Systolic 142 145 863  Diastolic 56 52 49  Pulse 62 58 58  8/9 increase hydralazine  to 37.5mg  BID 8/10 reports patient had been advised to take amlodipine  at night, medication timing adjusted.  She was also taking nebivolol .  Will hold off  on adding this today as we are changing amlodipine  timing.  If BP still elevated tomorrow consider adding low-dose Nebivolol  8/12 will restart Nebivolol  at 2.5 mg  8/13 increase Nebivolol  to 5mg  8/14 BP controlled doing better overall, continue current regimen and monitor  8/17: decrease amlodipine  to 5mg  given diastolic hypotension  - 8/18 fair control continue current regimen and monitor for now 9. Anemia of chronic disease: Negative work up by Dr. Freddie and Dr Lanny last month.     - Appears to be gradually trending down on review on records. Check FOBT  -8/13 hemoglobin overall stable at 8.4, discussed FOBT nursing team  -8/14 hemoglobin remained stable at 8.4  -8/18 no FOBT completed yet but hemoglobin appears stable 10. T2DM: Diet controlled- Monitor BS ac/hs.  8/18 controlled continue to monitor CBG (last 3)  Recent Labs    06/16/24 2111 06/17/24 0627 06/17/24 1121  GLUCAP 171* 112* 159*    11. H/o UTIs/Urinary retention: Question due to Merbetriq--d/c a couple of days ago?  Appears this.  For urinary retention.  Foley replaced. Will remove and start bladder program             -monitor voiding with PVR/bladder scan.   -Will ask nursing to get some PVRs  8/14 I see 1 bladder scan yesterday for  433 mL, unsure if this is before or after she voided.  Will asked nursing to document PVRs  8/15 bladder scan of 270, looks like she had to void a few minutes prior.  She is currently on Flomax .  Will asked nursing to check a few more.  If continues consider Urecholine   8/18 will add additional nursing order regarding PVR/bladder  scans, do not see any additional ones completed  12. Dementia: Continue Donepezil .   13. Constipation: Had enema yesterday after no BM X one week  - 8/10 lactulose  changed from daily to as needed, had a large bowel movement yesterday  8/11 last bm today. Pt/daughter satisfied with current regimen   8/13 LBM documented 6/11, will add prn Miralax  for mild constipation, encourage oral fluid intake  8/15 LBM yesterday  8/18 LBM yesterday, continue to monitor 14.  CKD 3b: Monitor with serial checks as well as UOP/voiding Cr at [facility] 7/29: 1.61 7/30: 1.52 7/31: 1.47 8/1: 1.89 8/2: 1.50 8/3: 1.36  8/4  1.34 8/5 1.32 8/7 1.30   - 8/9 creatinine 1.39, overall stable continue to monitor   8/11-a slight bump in BUN/Cr today (20/1.52).  I'm not too concerned at present. WIll encourage PO and follow up lab work on Wednesday.   8/14 Cr little higher at 1.58/BUN 25, still around overall baseline, continue encourage oral fluids again today  8/15 discussed oral fluid intake, continue to monitor labs.  If increases further consider some short-term IV fluids  8/16 Cr reviewed and is mildly improved  8/18 creatinine a lot higher, will start some gentle IVF 15. Obesity. Class 2. Dietary counseling   -Body mass index is 34.11 kg/m.     LOS: 10 days A FACE TO FACE EVALUATION WAS PERFORMED  Murray Collier 06/17/2024, 11:33 AM

## 2024-06-18 ENCOUNTER — Inpatient Hospital Stay (HOSPITAL_COMMUNITY)

## 2024-06-18 DIAGNOSIS — E1122 Type 2 diabetes mellitus with diabetic chronic kidney disease: Secondary | ICD-10-CM | POA: Diagnosis not present

## 2024-06-18 DIAGNOSIS — I10 Essential (primary) hypertension: Secondary | ICD-10-CM | POA: Diagnosis not present

## 2024-06-18 DIAGNOSIS — M25551 Pain in right hip: Secondary | ICD-10-CM

## 2024-06-18 DIAGNOSIS — R5381 Other malaise: Secondary | ICD-10-CM | POA: Diagnosis not present

## 2024-06-18 DIAGNOSIS — K5900 Constipation, unspecified: Secondary | ICD-10-CM | POA: Diagnosis not present

## 2024-06-18 LAB — GLUCOSE, CAPILLARY
Glucose-Capillary: 117 mg/dL — ABNORMAL HIGH (ref 70–99)
Glucose-Capillary: 146 mg/dL — ABNORMAL HIGH (ref 70–99)
Glucose-Capillary: 175 mg/dL — ABNORMAL HIGH (ref 70–99)
Glucose-Capillary: 162 mg/dL — ABNORMAL HIGH (ref 70–99)

## 2024-06-18 MED ORDER — AMLODIPINE BESYLATE 10 MG PO TABS
10.0000 mg | ORAL_TABLET | Freq: Every evening | ORAL | Status: DC
  Administered 2024-06-18 – 2024-06-28 (×11): 10 mg via ORAL
  Filled 2024-06-18 (×11): qty 1

## 2024-06-18 MED ORDER — LIDOCAINE 5 % EX PTCH
1.0000 | MEDICATED_PATCH | CUTANEOUS | Status: DC
  Administered 2024-06-18 – 2024-06-28 (×8): 1 via TRANSDERMAL
  Filled 2024-06-18 (×12): qty 1

## 2024-06-18 MED ORDER — NEBIVOLOL HCL 2.5 MG PO TABS
2.5000 mg | ORAL_TABLET | Freq: Every evening | ORAL | Status: DC
  Filled 2024-06-18 (×2): qty 1

## 2024-06-18 NOTE — Progress Notes (Signed)
 Physical Therapy Session Note  Patient Details  Name: [name] Mathews MRN: [id number] Date of Birth: 02-15-1934  Today's Date: 06/18/2024 PT Individual Time: 9098-9054, 1400-1450  PT Individual Time Calculation (min): 44 min, 50 min PT missed time: 10 min (fatigue)   Short Term Goals: Week 2:  PT Short Term Goal 1 (Week 2): pt will navigate 17 6 inch steps with B HR and min A PT Short Term Goal 2 (Week 2): pt perform sit to stand with LRAD and CGA consistently PT Short Term Goal 3 (Week 2): pt will ambulate 150 feet with LRAD and CGA  Skilled Therapeutic Interventions/Progress Updates:      Treatment Session 1  Pt seated EOB with tech upon arrival. Pt agreeable to therapy. Pt reports 5/10 R hip pain, nurse present to adminster medications. Therapist provided rest breaks and repositioning.   Pt requesting to use hte bathroom. Pt performed sit to stand with +++ time and CGA, vebrla cues provided for B UE pushing up from bed and B LE positoning.   Pt ambulated to bathroom with RW and CGA with max verbal cues for safety with RW espeicaly with navigating inclined threshold. Pt continent of bladder. Pt performed pericare while seated with set up assist. Pt doffed pants while standing with RW and CGA. Pt reuqired min A to donn pants over buttocks. Pt required seated rest break on toilet 2/2 fatigue. Pt ambulated to sink and washed hands.  Pt ambulated 1x140, 1x156  feet with RW and CGA, mod verbal cues provided for safety with RW, vebral cues provided for upright posture.   Pt performed 1x6 standing hip flexion with L HHA, verbal and tactile cues provided for increased hip and knee flexion and upright posture.   Pt seated in recliner at end of session with all needs within reach and chair alarm on.   Treatment session 2   Pt seated in recliner upon arrival. Pt agreeable to therapy. Pt denies any pain.   Pt requesting to use the bathroom. Pt ambulated with RW and CGA/close supervision, max  vebral cues provided for proximity with RW. Pt donned/doffed  pants with CGA, pt required ++++ time but pt continent of bladder.   Pt reports and demonstrates increased fatigue this afternoon and requesting to stay in the room for therapy.   Pt performed the follow seated therex for B LE strengthening/activity tolerance/ROM:   1x10 LAQ B with 2# ankle weight   1x10 seated marching B with 2# ankle weight  1x10 glute sets  1x10 heel toe raises B  Pt ambulated short distance ~5 feet with no AD and R HHA while performing lateral stepping to HOB.  Sit to supine with supervision with use of bed rail.   Pt supine in bed with all needs within reach and bed alarm on. Pt missed 10 minutes 2/2 fatigue.          Therapy Documentation Precautions:  Precautions Precautions: Fall Recall of Precautions/Restrictions: Intact Restrictions Weight Bearing Restrictions Per Provider Order: No  Therapy/Group: Individual Therapy  [facility] [name], [place], DPT  06/18/2024, 9:51 AM

## 2024-06-18 NOTE — Progress Notes (Signed)
 PROGRESS NOTE   Subjective/Complaints: Working in the gym again this morning.  She was given scheduled Tylenol , at about 6 AM.  Continues to have some pain in her hip but she does not want any additional oral medications at this time.  ROS: Patient denies fever, new vision changes,  nausea, vomiting, diarrhea, shortness of breath, abdominal pain, chest pain +Rt hip pain -continued  Objective:   No results found. Recent Labs    06/17/24 0539  WBC 6.8  HGB 8.5*  HCT 25.8*  PLT 220    Recent Labs    06/17/24 0539  NA 137  K 4.0  CL 105  CO2 24  GLUCOSE 104*  BUN 21  CREATININE 1.62*  CALCIUM  8.5*    Intake/Output Summary (Last 24 hours) at 06/18/2024 1436 Last data filed at 06/18/2024 1310 Gross per 24 hour  Intake 712 ml  Output --  Net 712 ml        Physical Exam: Vital Signs Blood pressure 137/61, pulse (!) 57, temperature 98.4 F (36.9 C), temperature source Oral, resp. rate 17, height 5' 7 (1.702 m), weight 98.8 kg, SpO2 99%.  General: No acute distress, working with therapy in the gym, on IV fluids HEENT: NCAT, EOMI, oral membranes a little dry Cards: Bradycardic Chest: Clear to auscultation bilaterally, nonlabored breathing  abdomen: Soft, NT, ND, + BS normal active Skin: No breakdown noted on visible portion of skin Extremities: no edema Psych: pleasant and appropriate  Skin: C/D/I. No apparent lesions.   MSK:      No apparent deformity.  Pain with internal and external rotation of right hip.         Neurologic exam:  Cognition: AAO x4 Language: Fluent Mood: Very pleasant Sensation: To light touch intact in BL UEs and LEs  CN: 2-12 grossly intact.  Coordination: Bilateral lower extremity tremors with exertion      Strength: Bilateral upper extremities 4/5 proximally, 5/5 distally      Bilateral lower extremities 4-/5 proximally, 4/5 distally Prior neuro assessment is c/w today's exam  06/18/2024.   Assessment/Plan: 1. Functional deficits which require 3+ hours per day of interdisciplinary therapy in a comprehensive inpatient rehab setting. Physiatrist is providing close team supervision and 24 hour management of active medical problems listed below. Physiatrist and rehab team continue to assess barriers to discharge/monitor patient progress toward functional and medical goals  Care Tool:  Bathing    Body parts bathed by patient: Right arm, Left arm, Chest, Abdomen, Right upper leg, Left upper leg, Right lower leg, Left lower leg, Face     Body parts n/a: Buttocks, Front perineal area, Right lower leg, Left lower leg   Bathing assist Assist Level: Contact Guard/Touching assist     Upper Body Dressing/Undressing Upper body dressing   What is the patient wearing?: Pull over shirt    Upper body assist Assist Level: Set up assist    Lower Body Dressing/Undressing Lower body dressing      What is the patient wearing?: Underwear/pull up, Pants     Lower body assist Assist for lower body dressing: Moderate Assistance - Patient 50 - 74%     Toileting Toileting  Toileting assist Assist for toileting: Maximal Assistance - Patient 25 - 49%     Transfers Chair/bed transfer  Transfers assist  Chair/bed transfer activity did not occur: Safety/medical concerns  Chair/bed transfer assist level: Contact Guard/Touching assist     Locomotion Ambulation   Ambulation assist      Assist level: Contact Guard/Touching assist Assistive device: Walker-rolling Max distance: 156   Walk 10 feet activity   Assist     Assist level: Contact Guard/Touching assist Assistive device: Walker-rolling   Walk 50 feet activity   Assist Walk 50 feet with 2 turns activity did not occur: Safety/medical concerns  Assist level: Contact Guard/Touching assist Assistive device: Walker-rolling    Walk 150 feet activity   Assist Walk 150 feet activity did not  occur: Safety/medical concerns (fatigue)  Assist level: Contact Guard/Touching assist Assistive device: Walker-rolling    Walk 10 feet on uneven surface  activity   Assist Walk 10 feet on uneven surfaces activity did not occur: Safety/medical concerns         Wheelchair     Assist Is the patient using a wheelchair?: Yes Type of Wheelchair: Manual    Wheelchair assist level: Dependent - Patient 0%      Wheelchair 50 feet with 2 turns activity    Assist        Assist Level: Dependent - Patient 0%   Wheelchair 150 feet activity     Assist      Assist Level: Dependent - Patient 0%   Blood pressure 137/61, pulse (!) 57, temperature 98.4 F (36.9 C), temperature source Oral, resp. rate 17, height 5' 7 (1.702 m), weight 98.8 kg, SpO2 99%.  Medical Problem List and Plan: 1. Functional deficits secondary to debility s/p UTI             -patient may shower             -ELOS/Goals: 21-24 days, Min A PT/OT             -Continue CIR therapies including PT, OT    - Expected discharge 8/30  - Team conference tomorrow  2.  Antithrombotics: -DVT/anticoagulation:  Pharmaceutical: continue Lovenox              -antiplatelet therapy: N/A  3. Pain Management: continue Tylenol  prn.   -8/13 patient like to avoid stronger pain medications.  Having some right hip pain after stairs.  Will try Tylenol  and heating pad for now.  8/14 hip pain doing a little better, continue current regimen for now, we will see how she does with therapy  8/15 discussed scheduling some Tylenol  before therapy, patient declines for now.  She reports overall controlled  8/18 will schedule Tylenol  in the morning, patient agreeable  8/19 Xray R hip, lidocaine  patch  4. Mood/Behavior/Sleep: LCSW to follow for evaluation and support.              -antipsychotic agents: N/A 5. Neuropsych/cognition: This patient is not fully capable of making decisions on her own behalf. 6. Skin/Wound Care:  Routine pressure relief meausre.  7. Fluids/Electrolytes/Nutrition: Monitor I/O. Check CMET in am.  8.HTN: Monitor BP TID- continue amlodipine  and hydralazine .     06/18/2024    1:06 PM 06/18/2024    5:37 AM 06/17/2024    8:45 PM  Vitals with BMI  Systolic 137 173 847  Diastolic 61 44 45  Pulse 57 61 55  8/9 increase hydralazine  to 37.5mg  BID 8/10 reports patient had been advised to take  amlodipine  at night, medication timing adjusted.  She was also taking nebivolol .  Will hold off on adding this today as we are changing amlodipine  timing.  If BP still elevated tomorrow consider adding low-dose Nebivolol  8/12 will restart Nebivolol  at 2.5 mg  8/13 increase Nebivolol  to 5mg  8/14 BP controlled doing better overall, continue current regimen and monitor  8/17: decrease amlodipine  to 5mg  given diastolic hypotension  - 8/18 fair control continue current regimen and monitor for now  - 8/19 systolic BP intermittently elevated, diastolic a little low.  Will increase amlodipine  back to 10 mg but decrease Nebivolol  to 2.5 mg-heart rate occasionally a little lower in upper 50s 9. Anemia of chronic disease: Negative work up by Dr. Freddie and Dr Lanny last month.     - Appears to be gradually trending down on review on records. Check FOBT  -8/13 hemoglobin overall stable at 8.4, discussed FOBT nursing team  -8/14 hemoglobin remained stable at 8.4  -8/18 no FOBT completed yet but hemoglobin appears stable 10. T2DM: Diet controlled- Monitor BS ac/hs.  8/18 controlled continue to monitor CBG (last 3)  Recent Labs    06/17/24 2050 06/18/24 0547 06/18/24 1208  GLUCAP 168* 117* 146*    11. H/o UTIs/Urinary retention: Question due to Merbetriq--d/c a couple of days ago?  Appears this.  For urinary retention.  Foley replaced. Will remove and start bladder program             -monitor voiding with PVR/bladder scan.   -Will ask nursing to get some PVRs  8/14 I see 1 bladder scan yesterday for  433  mL, unsure if this is before or after she voided.  Will asked nursing to document PVRs  8/15 bladder scan of 270, looks like she had to void a few minutes prior.  She is currently on Flomax .  Will asked nursing to check a few more.  If continues consider Urecholine   8/18 will add additional nursing order regarding PVR/bladder scans, do not see any additional ones completed  -8/19 PVRs a little elevated but has not required IC, continue to monitor  12. Dementia: Continue Donepezil .   13. Constipation: Had enema yesterday after no BM X one week  - 8/10 lactulose  changed from daily to as needed, had a large bowel movement yesterday  8/11 last bm today. Pt/daughter satisfied with current regimen   8/13 LBM documented 6/11, will add prn Miralax  for mild constipation, encourage oral fluid intake  8/15 LBM yesterday  8/19 LBM 8/19, consider additional medication tomorrow if no BM by then 14.  CKD 3b: Monitor with serial checks as well as UOP/voiding   - 8/9 creatinine 1.39, overall stable continue to monitor   8/11-a slight bump in BUN/Cr today (20/1.52).  I'm not too concerned at present. WIll encourage PO and follow up lab work on Wednesday.   8/14 Cr little higher at 1.58/BUN 25, still around overall baseline, continue encourage oral fluids again today  8/15 discussed oral fluid intake, continue to monitor labs.  If increases further consider some short-term IV fluids  8/16 Cr reviewed and is mildly improved  8/18 creatinine a little higher, will start some gentle IVF  Recheck BMP tomorrow 15. Obesity. Class 2. Dietary counseling   -Body mass index is 34.11 kg/m.     LOS: 11 days A FACE TO FACE EVALUATION WAS PERFORMED  Murray Collier 06/18/2024, 2:36 PM

## 2024-06-18 NOTE — Progress Notes (Signed)
 Occupational Therapy Session Note  Patient Details  Name: [name] Mathews MRN: [id number] Date of Birth: 1934/10/01  Session 1: Today's Date: 06/18/2024 OT Individual Time: 8897-8798 OT Individual Time Calculation (min): 59 min   Session 2: Today's Date: 06/18/2024 OT Individual Time: 8699-8654 OT Individual Time Calculation (min): 45 min   Short Term Goals: Week 2:  OT Short Term Goal 1 (Week 2): patient will complete showering with CGA OT Short Term Goal 2 (Week 2): patient will tolerate functional activity for 10 minutes  with 2 or fewer rests to increase activity tolerance.  Session 1: Skilled Therapeutic Interventions/Progress Updates:    Patient agreeable to participate in OT session. Reports no pain level.   Patient participated in skilled OT session focusing on functional mobility, activity tolerance, and toileting. Patient received in chair ready for therapy. Completed functional mobility to gym requiring increased time and encouragement for initiation. Patient completed NuStep maintaining a 45 spm pace for 3 sets of 5-6 minutes to increase activity tolerance. Patient completed functional mobility with RW back to room. Patient required nursing to change fluids through IV during session. Once returning to room patient completed toileting with CGA/ min A for pants pull up / down and posterior hygiene. Patient left with CNA in chair for bladder scan following session .   Session 2: Skilled Therapeutic Interventions/Progress Updates:    Patient agreeable to participate in OT session. Reports 4/10 pain level.   Patient participated in skilled OT session focusing on AE use. Patient participated in skilled education on dressing stick, reacher, and sock aide with teach back/ demonstration method used to increase independence and decrease need for reaching. Patient will require further instruction on use of AE. Patient required increased time to complete all AE use and mod to max verbal  cues. Patient left in recliner all needs in reach alarm on.   Therapy Documentation Precautions:  Precautions Precautions: Fall Recall of Precautions/Restrictions: Intact Restrictions Weight Bearing Restrictions Per Provider Order: No  Therapy/Group: Individual Therapy  D'mariea L Fransisca Shawn 06/18/2024, 8:29 AM

## 2024-06-19 DIAGNOSIS — E1122 Type 2 diabetes mellitus with diabetic chronic kidney disease: Secondary | ICD-10-CM | POA: Diagnosis not present

## 2024-06-19 DIAGNOSIS — K5900 Constipation, unspecified: Secondary | ICD-10-CM | POA: Diagnosis not present

## 2024-06-19 DIAGNOSIS — R5381 Other malaise: Secondary | ICD-10-CM | POA: Diagnosis not present

## 2024-06-19 DIAGNOSIS — I10 Essential (primary) hypertension: Secondary | ICD-10-CM | POA: Diagnosis not present

## 2024-06-19 LAB — CBC
HCT: 27.1 % — ABNORMAL LOW (ref 36.0–46.0)
Hemoglobin: 8.9 g/dL — ABNORMAL LOW (ref 12.0–15.0)
MCH: 29.5 pg (ref 26.0–34.0)
MCHC: 32.8 g/dL (ref 30.0–36.0)
MCV: 89.7 fL (ref 80.0–100.0)
Platelets: 219 K/uL (ref 150–400)
RBC: 3.02 MIL/uL — ABNORMAL LOW (ref 3.87–5.11)
RDW: 15 % (ref 11.5–15.5)
WBC: 6.6 K/uL (ref 4.0–10.5)
nRBC: 0 % (ref 0.0–0.2)

## 2024-06-19 LAB — BASIC METABOLIC PANEL WITH GFR
Anion gap: 11 (ref 5–15)
BUN: 24 mg/dL — ABNORMAL HIGH (ref 8–23)
CO2: 23 mmol/L (ref 22–32)
Calcium: 8.6 mg/dL — ABNORMAL LOW (ref 8.9–10.3)
Chloride: 104 mmol/L (ref 98–111)
Creatinine, Ser: 1.42 mg/dL — ABNORMAL HIGH (ref 0.44–1.00)
GFR, Estimated: 36 mL/min — ABNORMAL LOW (ref >60)
Glucose, Bld: 125 mg/dL — ABNORMAL HIGH (ref 70–99)
Potassium: 3.9 mmol/L (ref 3.5–5.1)
Sodium: 138 mmol/L (ref 135–145)

## 2024-06-19 LAB — GLUCOSE, CAPILLARY
Glucose-Capillary: 127 mg/dL — ABNORMAL HIGH (ref 70–99)
Glucose-Capillary: 126 mg/dL — ABNORMAL HIGH (ref 70–99)
Glucose-Capillary: 158 mg/dL — ABNORMAL HIGH (ref 70–99)
Glucose-Capillary: 180 mg/dL — ABNORMAL HIGH (ref 70–99)
Glucose-Capillary: 144 mg/dL — ABNORMAL HIGH (ref 70–99)

## 2024-06-19 MED ORDER — NEBIVOLOL HCL 2.5 MG PO TABS
2.5000 mg | ORAL_TABLET | Freq: Every evening | ORAL | Status: DC
  Administered 2024-06-19: 2.5 mg via ORAL
  Filled 2024-06-19 (×2): qty 1

## 2024-06-19 MED ORDER — POLYETHYLENE GLYCOL 3350 17 G PO PACK
34.0000 g | PACK | Freq: Once | ORAL | Status: AC
  Administered 2024-06-19: 34 g via ORAL
  Filled 2024-06-19: qty 2

## 2024-06-19 MED ORDER — NEBIVOLOL HCL 5 MG PO TABS: 5.0000 mg | ORAL_TABLET | Freq: Every evening | ORAL | Status: DC

## 2024-06-19 MED ORDER — TAMSULOSIN HCL 0.4 MG PO CAPS
0.8000 mg | ORAL_CAPSULE | Freq: Every day | ORAL | Status: DC
  Administered 2024-06-19 – 2024-06-28 (×10): 0.8 mg via ORAL
  Filled 2024-06-19 (×10): qty 2

## 2024-06-19 NOTE — Progress Notes (Signed)
 Physical Therapy Session Note  Patient Details  Name: AVEENA BARI MRN: [id number] Date of Birth: [birth year]  Today's Date: 06/19/2024 PT Individual Time: 1400-1445 PT Individual Time Calculation (min): 45 min   Short Term Goals: Week 2:  PT Short Term Goal 1 (Week 2): pt will navigate 17 6 inch steps with B HR and min A PT Short Term Goal 2 (Week 2): pt perform sit to stand with LRAD and CGA consistently PT Short Term Goal 3 (Week 2): pt will ambulate 150 feet with LRAD and CGA  Skilled Therapeutic Interventions/Progress Updates:   Pt received upright in recliner, agreeable to therapy. Pt denied any pain throughout session.  Pt wheeled to dayroom and educated on rollator use. Pt stated she liked being in the skywalk, so pt wheeled to skywalk for rollator trial. Pt demonstrated good carryover w/ rollator safety training w/ verbal and visual cues, including putting rollator against wall before engaging brakes to sit, etc.  Pt ambulated 55', 21' w/ rollator and CGA for safety to promote improved functional independence and activity tolerance. Pt required concurrent feedback on rollator use to stand closer to device during ambulation. Pt able to sense rollator moving away from her, given cues to step closer and press down through handles instead of forward, pt verbalized and demonstrated understanding, stating it feels like it's not running away from me.  Pt wheeled back to room for time, remaining upright in [facility] for next PT session. WC brakes locked, pt given tray table, call bell, and all other needs within reach.    Therapy Documentation Precautions:  Precautions Precautions: Fall Recall of Precautions/Restrictions: Intact Restrictions Weight Bearing Restrictions Per Provider Order: No   Therapy/Group: Individual Therapy  Oneil Waldemar Kirt Dasie PT, DPT 06/19/2024, 2:46 PM

## 2024-06-19 NOTE — Progress Notes (Signed)
 Physical Therapy Session Note  Patient Details  Name: [name] Mathews MRN: [id number] Date of Birth: [birth year]  Today's Date: 06/19/2024 PT Individual Time: [phone], [id number] PT Individual Time Calculation (min): 60 min, 42 min  Short Term Goals: Week 2:  PT Short Term Goal 1 (Week 2): pt will navigate 17 6 inch steps with B HR and min A PT Short Term Goal 2 (Week 2): pt perform sit to stand with LRAD and CGA consistently PT Short Term Goal 3 (Week 2): pt will ambulate 150 feet with LRAD and CGA  Skilled Therapeutic Interventions/Progress Updates:      Treatment Session 1  Pt seated in recliner upon arrival. Pt agreeable to therapy. Pt denies any pain.   Pt incontinent of bladder upon ambulation to bathroom. Pt required +++ time on the toilet and endorses need to pee again. Therapist donned/doffed brief with total A for time and energy conservation.   Pt performed sit to stand and stand pivot transfer throughout session with RW and CGA, verbal cues provided for B UE positioning on arm rests of WC and reclienr for power up/controlled descent.   Pt required max verbal cues for safety with RW and upright posture with  ambulation to bathroom 2/2 urgency.   Pt navigated 2x8 6 inch steps with B handrails and CGA; first set with reciprocal gait with ascending, and step to gait with descending, 2nd set with step to gait both directions.   Pt ambulated 90 feet with RW and CGA, verbal cues provided for upright posture, forward gaze and safety with RW espeically with fatigue.   Pt seated in recliner at end of session with all needs within reach and chiar alarm on.    Treatment Session 2   Pt seated in WC upon arrival. Pt agreeable to therpapy. Pt denies any pain.   Pt ambualted 2x142 feet with rollator with CGA, verbal cues provided for safety with rollator with emphasis on proximity. Pt reports preference for rollator verus RW, verbal cues provided to push down on rollator versus forward.  Verbal cues provided for technique/sequencing ambulatory transfer to nu step   Pt completed 5 min on nu step at level 1 with B UE/LE for total of 140 steps, and additional 6 min on nu step at level 2 with B UE/LE for total of 200 steps.   Pt supine in bed upon arrival with all needs within reach and bed alarm on.      Therapy Documentation Precautions:  Precautions Precautions: Fall Recall of Precautions/Restrictions: Intact Restrictions Weight Bearing Restrictions Per Provider Order: No   Therapy/Group: Individual Therapy  [facility], [place], DPT  06/19/2024, 7:46 AM

## 2024-06-19 NOTE — Progress Notes (Signed)
 Occupational Therapy Session Note  Patient Details  Name: [name] Mathews MRN: [id number] Date of Birth: 10/06/1934  Today's Date: 06/19/2024 OT Individual Time: 1101-1200 OT Individual Time Calculation (min): 59 min    Short Term Goals: Week 2:  OT Short Term Goal 1 (Week 2): patient will complete showering with CGA OT Short Term Goal 2 (Week 2): patient will tolerate functional activity for 10 minutes  with 2 or fewer rests to increase activity tolerance.  Skilled Therapeutic Interventions/Progress Updates:    Patient agreeable to participate in OT session. Reports no pain level.   Patient participated in skilled OT session focusing on dynamic balance, toileting, functional mobility. Patient able to complete functional mobility approx 100 ft to gym. Completed dynamic balance standing tossing activity. Patient requested to complete toileting during activity. Able to complete toileting 3/3 with SUP to CGA. Patient completed functional mobility with RW for 160 ft with decreased speed. Returned to recliner for bladder scan from NT. All needs in reach.   Therapy Documentation Precautions:  Precautions Precautions: Fall Recall of Precautions/Restrictions: Intact Restrictions Weight Bearing Restrictions Per Provider Order: No   Therapy/Group: Individual Therapy  D'mariea L Norah Devin 06/19/2024, 7:22 AM

## 2024-06-19 NOTE — Progress Notes (Signed)
 PROGRESS NOTE   Subjective/Complaints: Hip pain doing better today.  She has had mildly elevated bladder scans but declined intermittent catheterization with nursing earlier.  ROS: Patient denies fever, new vision changes,  nausea, vomiting, diarrhea, shortness of breath, abdominal pain, chest pain +Rt hip pain -improved + Urinary retention  Objective:   DG HIP UNILAT WITH PELVIS 2-3 VIEWS RIGHT Result Date: 06/18/2024 CLINICAL DATA:  Right hip pain EXAM: DG HIP (WITH OR WITHOUT PELVIS) 2-3V RIGHT COMPARISON:  06/30/2009 FINDINGS: Frontal view of the pelvis as well as frontal and cross-table lateral views of the right hip are obtained. The cross-table lateral views are limited due to technique and body habitus. No evidence of acute displaced fracture, subluxation, or dislocation. Symmetrical bilateral hip osteoarthritis. Sacroiliac joints are unremarkable. Postsurgical changes lower lumbar spine. IMPRESSION: 1. No acute displaced fracture. 2. Symmetrical bilateral hip osteoarthritis. Electronically Signed   By: Ozell Daring M.D.   On: 06/18/2024 17:34   Recent Labs    06/17/24 0539 06/19/24 0535  WBC 6.8 6.6  HGB 8.5* 8.9*  HCT 25.8* 27.1*  PLT 220 219    Recent Labs    06/17/24 0539 06/19/24 0535  NA 137 138  K 4.0 3.9  CL 105 104  CO2 24 23  GLUCOSE 104* 125*  BUN 21 24*  CREATININE 1.62* 1.42*  CALCIUM  8.5* 8.6*    Intake/Output Summary (Last 24 hours) at 06/19/2024 1230 Last data filed at 06/19/2024 0700 Gross per 24 hour  Intake 358 ml  Output --  Net 358 ml        Physical Exam: Vital Signs Blood pressure (!) 173/58, pulse 63, temperature 98.1 F (36.7 C), resp. rate 17, height 5' 7 (1.702 m), weight 98.8 kg, SpO2 100%.  General: No acute distress, working into room with therapy HEENT: NCAT, EOMI, oral membranes a little dry Cards: Bradycardic Chest: Clear to auscultation bilaterally, nonlabored  breathing  abdomen: Soft, NT, ND, + BS normal active Skin: No breakdown noted on visible portion of skin Extremities: no edema Psych: pleasant and appropriate  Skin: C/D/I. No apparent lesions.   MSK:      No apparent deformity.  Pain with internal and external rotation of right hip.  No significant greater trochanter tenderness today       Neurologic exam:  Cognition: AAO x4 Language: Fluent Mood: Very pleasant Sensation: To light touch intact in BL UEs and LEs  CN: 2-12 grossly intact.  Coordination: Bilateral lower extremity tremors with exertion      Strength: Bilateral upper extremities 4/5 proximally, 5/5 distally      Bilateral lower extremities 4-/5 proximally, 4/5 distally Prior neuro assessment is c/w today's exam 06/19/2024.   Assessment/Plan: 1. Functional deficits which require 3+ hours per day of interdisciplinary therapy in a comprehensive inpatient rehab setting. Physiatrist is providing close team supervision and 24 hour management of active medical problems listed below. Physiatrist and rehab team continue to assess barriers to discharge/monitor patient progress toward functional and medical goals  Care Tool:  Bathing    Body parts bathed by patient: Right arm, Left arm, Chest, Abdomen, Right upper leg, Left upper leg, Right lower leg, Left lower  leg, Face     Body parts n/a: Buttocks, Front perineal area, Right lower leg, Left lower leg   Bathing assist Assist Level: Contact Guard/Touching assist     Upper Body Dressing/Undressing Upper body dressing   What is the patient wearing?: Pull over shirt    Upper body assist Assist Level: Set up assist    Lower Body Dressing/Undressing Lower body dressing      What is the patient wearing?: Underwear/pull up, Pants     Lower body assist Assist for lower body dressing: Moderate Assistance - Patient 50 - 74%     Toileting Toileting    Toileting assist Assist for toileting: Maximal Assistance -  Patient 25 - 49%     Transfers Chair/bed transfer  Transfers assist  Chair/bed transfer activity did not occur: Safety/medical concerns  Chair/bed transfer assist level: Contact Guard/Touching assist     Locomotion Ambulation   Ambulation assist      Assist level: Contact Guard/Touching assist Assistive device: Walker-rolling Max distance: 156   Walk 10 feet activity   Assist     Assist level: Contact Guard/Touching assist Assistive device: Walker-rolling   Walk 50 feet activity   Assist Walk 50 feet with 2 turns activity did not occur: Safety/medical concerns  Assist level: Contact Guard/Touching assist Assistive device: Walker-rolling    Walk 150 feet activity   Assist Walk 150 feet activity did not occur: Safety/medical concerns (fatigue)  Assist level: Contact Guard/Touching assist Assistive device: Walker-rolling    Walk 10 feet on uneven surface  activity   Assist Walk 10 feet on uneven surfaces activity did not occur: Safety/medical concerns         Wheelchair     Assist Is the patient using a wheelchair?: Yes Type of Wheelchair: Manual    Wheelchair assist level: Dependent - Patient 0%      Wheelchair 50 feet with 2 turns activity    Assist        Assist Level: Dependent - Patient 0%   Wheelchair 150 feet activity     Assist      Assist Level: Dependent - Patient 0%   Blood pressure (!) 173/58, pulse 63, temperature 98.1 F (36.7 C), resp. rate 17, height 5' 7 (1.702 m), weight 98.8 kg, SpO2 100%.  Medical Problem List and Plan: 1. Functional deficits secondary to debility s/p UTI             -patient may shower             -ELOS/Goals: 21-24 days, Min A PT/OT             -Continue CIR therapies including PT, OT    - Expected discharge 8/30  - Team conference today please see physician documentation under team conference tab, met with team  to discuss problems,progress, and goals. Formulized individual  treatment plan based on medical history, underlying problem and comorbidities.    2.  Antithrombotics: -DVT/anticoagulation:  Pharmaceutical: continue Lovenox              -antiplatelet therapy: N/A  3. Pain Management: continue Tylenol  prn.   -8/13 patient like to avoid stronger pain medications.  Having some right hip pain after stairs.  Will try Tylenol  and heating pad for now.  8/14 hip pain doing a little better, continue current regimen for now, we will see how she does with therapy  8/15 discussed scheduling some Tylenol  before therapy, patient declines for now.  She reports overall controlled  8/18  will schedule Tylenol  in the morning, patient agreeable  8/19 Xray R hip- B/L hip OA, lidocaine  patch   4. Mood/Behavior/Sleep: LCSW to follow for evaluation and support.              -antipsychotic agents: N/A 5. Neuropsych/cognition: This patient is not fully capable of making decisions on her own behalf. 6. Skin/Wound Care: Routine pressure relief meausre.  7. Fluids/Electrolytes/Nutrition: Monitor I/O. Check CMET in am.  8.HTN: Monitor BP TID- continue amlodipine  and hydralazine .     06/19/2024   12:31 PM 06/19/2024    6:13 AM 06/18/2024   10:00 PM  Vitals with BMI  Systolic 146 173 830  Diastolic 55 58 64  Pulse 55 63 59  8/9 increase hydralazine  to 37.5mg  BID 8/10 reports patient had been advised to take amlodipine  at night, medication timing adjusted.  She was also taking nebivolol .  Will hold off on adding this today as we are changing amlodipine  timing.  If BP still elevated tomorrow consider adding low-dose Nebivolol  8/12 will restart Nebivolol  at 2.5 mg  8/13 increase Nebivolol  to 5mg  8/14 BP controlled doing better overall, continue current regimen and monitor  8/17: decrease amlodipine  to 5mg  given diastolic hypotension  - 8/18 fair control continue current regimen and monitor for now  - 8/19 systolic BP intermittently elevated, diastolic a little low.  Will increase  amlodipine  back to 10 mg but decrease Nebivolol  to 2.5 mg  -8/20 systolic blood pressure remains elevated , increase flomax , consider increase Nebivolol  back to 5 mg, monitor HR 9. Anemia of chronic disease: Negative work up by Dr. Freddie and Dr Lanny last month.     - Appears to be gradually trending down on review on records. Check FOBT  -8/13 hemoglobin overall stable at 8.4, discussed FOBT nursing team  -8/14 hemoglobin remained stable at 8.4  -8/18 no FOBT completed yet but hemoglobin appears stable  -8/19 hemoglobin stable at 8.9 10. T2DM: Diet controlled- Monitor BS ac/hs.  8/18 controlled continue to monitor CBG (last 3)  Recent Labs    06/19/24 0621 06/19/24 0637 06/19/24 1145  GLUCAP 127* 126* 158*    11. H/o UTIs/Urinary retention: Question due to Merbetriq--d/c a couple of days ago?  Appears this.  For urinary retention.  Foley replaced. Will remove and start bladder program             -monitor voiding with PVR/bladder scan.   -Will ask nursing to get some PVRs  8/14 I see 1 bladder scan yesterday for  433 mL, unsure if this is before or after she voided.  Will asked nursing to document PVRs  8/15 bladder scan of 270, looks like she had to void a few minutes prior.  She is currently on Flomax .  Will asked nursing to check a few more.  If continues consider Urecholine   8/18 will add additional nursing order regarding PVR/bladder scans, do not see any additional ones completed  -8/20 PVRs remain mildly elevated, increase flomax  to 0.8mg , she is declining IC  12. Dementia: Continue Donepezil .   13. Constipation: Had enema yesterday after no BM X one week  - 8/10 lactulose  changed from daily to as needed, had a large bowel movement yesterday  8/11 last bm today. Pt/daughter satisfied with current regimen   8/13 LBM documented 6/11, will add prn Miralax  for mild constipation, encourage oral fluid intake  8/20 Miralax  34g , LBM 8/17 14.  CKD 3b: Monitor with serial checks  as well as UOP/voiding   -  8/9 creatinine 1.39, overall stable continue to monitor   8/11-a slight bump in BUN/Cr today (20/1.52).  I'm not too concerned at present. WIll encourage PO and follow up lab work on Wednesday.   8/14 Cr little higher at 1.58/BUN 25, still around overall baseline, continue encourage oral fluids again today  8/15 discussed oral fluid intake, continue to monitor labs.  If increases further consider some short-term IV fluids  8/16 Cr reviewed and is mildly improved  8/18 creatinine a little higher, will start some gentle IVF  8/20 BUN 24, Cr down to 1.42, recheck Friday, DC IVF. Encourage oral fluids 15. Obesity. Class 2. Dietary counseling   -Body mass index is 34.11 kg/m.     LOS: 12 days A FACE TO FACE EVALUATION WAS PERFORMED  Murray Collier 06/19/2024, 12:30 PM

## 2024-06-19 NOTE — Progress Notes (Signed)
 PVR preformed twice on my shift and patient is retaining. Patient does not want to be cathed regardless of volume. Dr. Murray notified.   [name] Mathews KATHEE Molt

## 2024-06-19 NOTE — Patient Care Conference (Cosign Needed)
 Inpatient RehabilitationTeam Conference and Plan of Care Update Date: 06/19/2024   Time: 1229 pm   Patient Name: [name] Mathews      Medical Record Number: [id number]  Date of Birth: 07-Oct-1934 Sex: Female         Room/Bed: 4W20C/4W20C-01 Payor Info: Payor: MEDICARE / Plan: MEDICARE PART A AND B / Product Type: *No Product type* /    Admit Date/Time:  06/07/2024 12:57 PM  Primary Diagnosis:  Debility  Hospital Problems: Principal Problem:   Debility    Expected Discharge Date: Expected Discharge Date: 06/29/24  Team Members Present: Physician leading conference: Dr. Murray Collier Social Worker Present: Rhoda Clement, LCSW Nurse Present: Eulalio Falls, RN PT Present: [name], PT OT Present: Other (comment) ANCEL Seip, OT) PPS Coordinator present : Eleanor Colon, SLP     Current Status/Progress Goal Weekly Team Focus  Bowel/Bladder   incontinent of bowel/bladder. LBM 06/16/24   To be continent of bowel/bladder with min assist   Assess bowel/bladder function q shift and as needed and also timed toileting q 2hrs.    Swallow/Nutrition/ Hydration               ADL's   ADLS CGA, LB min to mod training on AE for LB ADLs   SUP to CG   Activity tolerance, AE use, LB ADLs, bending, balance, toileting.    Mobility   bed mobility supervision with use of bed rail on flat bed, sit to stand with RW and CGA, stand pivot transfer with RW and CGA, gait x 156 feet with RW and CGA, stair navigation x 12 6 inch steps with CGA/min A. plan to trial rollator   supervision/CGA  D/C 8/30, follow up HHPT, DME: pt has RW, biggest barrier to D/C is stair navigation 17 steps total (9,landing, 8)    Communication                Safety/Cognition/ Behavioral Observations               Pain   No complain of pain   To remain pain free while in Rehab   Assess and treat any pain q shift and as needed    Skin   No skin issue   Skin to remain free of breakdown/infection   Assess skin q shift and as needed      Discharge Planning:  Continuing to make progress in therapies and is working on steps with PT. Daughter stays the night and then goes home and comes back to see mom in the afternoon. Aware of her progress and pleased with how well she is doing.    Team Discussion: Patient was admitted post debility due to Urinary tract infection. Patient with right hip pain/ high blood pressure/urinary retention: medications adjusted by MD.   Patient on target to meet rehab goals: Currently, patient requires CGA with ADLs. Patient needs CGA with transfers using a RW. Patient was able to ambulate up to 13' with CGA using a RW. Overall goals at discharge are set for Supervision/CGA.  *See Care Plan and progress notes for long and short-term goals.   Revisions to Treatment Plan:  Trial Rollator Sockaid Reacher   Teaching Needs: Safety, medications, dietary modifications, transfers, toileting, etc   Current Barriers to Discharge: Decreased caregiver support, Home enviroment access/layout, Incontinence, and Weight  Possible Resolutions to Barriers: Family Education Home health follow up DME: W/C    Medical Summary Current Status: Debility, HTN, HTN, Anemia, HIp pain, Urine retention,  CKD  Barriers to Discharge: Medical stability;Self-care education;Renal Insufficiency/Failure  Barriers to Discharge Comments: Debility, HTN, HTN, Anemia, HIp pain, Urine retention, CKD Possible Resolutions to [employer] Focus: IVF, tylenol  for hip pain, monitor CBC/BMP, IVF   Continued Need for Acute Rehabilitation Level of Care: The patient requires daily medical management by a physician with specialized training in physical medicine and rehabilitation for the following reasons: Direction of a multidisciplinary physical rehabilitation program to maximize functional independence : Yes Medical management of patient stability for increased activity during participation in an  intensive rehabilitation regime.: Yes Analysis of laboratory values and/or radiology reports with any subsequent need for medication adjustment and/or medical intervention. : Yes   I attest that I was present, lead the team conference, and concur with the assessment and plan of the team.   Caroll Cunnington Gayo 06/19/2024, 1229 pm

## 2024-06-20 DIAGNOSIS — N189 Chronic kidney disease, unspecified: Secondary | ICD-10-CM | POA: Diagnosis not present

## 2024-06-20 DIAGNOSIS — I10 Essential (primary) hypertension: Secondary | ICD-10-CM | POA: Diagnosis not present

## 2024-06-20 DIAGNOSIS — R5381 Other malaise: Secondary | ICD-10-CM | POA: Diagnosis not present

## 2024-06-20 DIAGNOSIS — K5900 Constipation, unspecified: Secondary | ICD-10-CM | POA: Diagnosis not present

## 2024-06-20 LAB — GLUCOSE, CAPILLARY
Glucose-Capillary: 124 mg/dL — ABNORMAL HIGH (ref 70–99)
Glucose-Capillary: 145 mg/dL — ABNORMAL HIGH (ref 70–99)
Glucose-Capillary: 195 mg/dL — ABNORMAL HIGH (ref 70–99)
Glucose-Capillary: 115 mg/dL — ABNORMAL HIGH (ref 70–99)

## 2024-06-20 MED ORDER — NEBIVOLOL HCL 5 MG PO TABS
5.0000 mg | ORAL_TABLET | Freq: Every evening | ORAL | Status: DC
  Administered 2024-06-20 – 2024-06-28 (×9): 5 mg via ORAL
  Filled 2024-06-20 (×9): qty 1

## 2024-06-20 MED ORDER — BETHANECHOL CHLORIDE 10 MG PO TABS
5.0000 mg | ORAL_TABLET | Freq: Three times a day (TID) | ORAL | Status: DC
  Administered 2024-06-20 – 2024-06-21 (×4): 5 mg via ORAL
  Filled 2024-06-20 (×4): qty 1

## 2024-06-20 NOTE — Progress Notes (Signed)
 Physical Therapy Session Note  Patient Details  Name: [name] Mathews MRN: [id number] Date of Birth: October 02, 1934  Today's Date: 06/20/2024 PT Individual Time: 9054-8954 PT Individual Time Calculation (min): 60 min   Short Term Goals: Week 2:  PT Short Term Goal 1 (Week 2): pt will navigate 17 6 inch steps with B HR and min A PT Short Term Goal 2 (Week 2): pt perform sit to stand with LRAD and CGA consistently PT Short Term Goal 3 (Week 2): pt will ambulate 150 feet with LRAD and CGA  Skilled Therapeutic Interventions/Progress Updates: Pt presented in bed agreeable to therapy. Pt denies pain at start of session. Pt completed supine to sit with use of bed features and supervision. Pt encouraged to use bathroom before getting dressed. Pt stood with CGA and RW and ambulated to toilet. Supervision for clothing management and toilet transfer. With increased time pt with continent urinary void and BM. Pt able to stand and complete frontal peri hygiene with PTA providing total A for buttocks. Pt able to pull up brief with supervision and ambulate to sink to complete hand hygiene in standing with supervision. Pt returned to EOB and donned bra and shirt with set up A and increased time. NT arrived for PVR, pt completed bed mobility with supervision both to/from supine with use of bed features. Once completed and back EOB pt required minA for threading pants. Pt stood with supervision and pulled pants over hips with supervision. Completed ambulatory transfer to w/c and pt transported to main gym for time management. Participated step ups to 3in x 10 bilaterally for hip flexor and LE strengthening. After extended seated rest pt then performed ascending/descending 6in steps 2 x 12 with seated 3 min break between sets. Pt then transported back to room and completed ambulatory transfer to recliner. Pt left in recliner at end of session with seat alarm on, call bell within reach and needs met.   Tx2: Pt presented in  recliner agreeable to therapy. Pt denies pain at start of session. Pt standing from recliner with supervision and ambulated to main gym with rollator with CGA fading to supervision. In gym pt participated in seated LE therex as follows:  LAQ 2.5lb weight 2 x 10 Seated hip flexion 2.5lb weight 2 x 10 Heel raises 2.5lb weight to fatigue (~25) Hip abd/add 2.5lb weight 2 x 10 Sit to stand with green theraband at knees for increased glue med recruitment  Pt also participated in standing balance activity including ball toss to rebounder without AD support 2 x 5. Increased fatigue with activity requiring increased time during seated rest. Pt then ambulated to parallel bars and ambulated 69ft x 2 without AD for increased balance challenge. Pt noted to ambulate with decreased arm swing, forward flexed posture, and shortened step length requiring seated rest between bouts. Pt them ambulated back to room with rollator at end of session with supervision and returned to recliner. Pt left in recliner with seat alarm on, call bell within reach and needs met.       Therapy Documentation Precautions:  Precautions Precautions: Fall Recall of Precautions/Restrictions: Intact Restrictions Weight Bearing Restrictions Per Provider Order: No General: PT Amount of Missed Time (min): 15 Minutes PT Missed Treatment Reason: Other (Comment) (late arrival)    Therapy/Group: Individual Therapy  Reet Scharrer 06/20/2024, 12:24 PM

## 2024-06-20 NOTE — Plan of Care (Signed)
  Problem: Consults Goal: RH GENERAL PATIENT EDUCATION Description: See Patient Education module for education specifics. Outcome: Progressing   Problem: RH BOWEL ELIMINATION Goal: RH STG MANAGE BOWEL WITH ASSISTANCE Description: STG Manage Bowel with Assistance. Outcome: Progressing   Problem: RH BLADDER ELIMINATION Goal: RH STG MANAGE BLADDER WITH ASSISTANCE Description: STG Manage Bladder With minimal  Assistance Outcome: Progressing   Problem: RH SKIN INTEGRITY Goal: RH STG SKIN FREE OF INFECTION/BREAKDOWN Description: Manage skin free of infection/breakdown with minimal assistance Outcome: Progressing   Problem: RH SAFETY Goal: RH STG ADHERE TO SAFETY PRECAUTIONS W/ASSISTANCE/DEVICE Description: STG Adhere to Safety Precautions With minimal assistance Assistance/Device. Outcome: Progressing   Problem: RH PAIN MANAGEMENT Goal: RH STG PAIN MANAGED AT OR BELOW PT'S PAIN GOAL Outcome: Progressing   Problem: RH KNOWLEDGE DEFICIT GENERAL Goal: RH STG INCREASE KNOWLEDGE OF SELF CARE AFTER HOSPITALIZATION Description: Manage increase knowledge of self care after hospitalization with minimal assistance from daughter using educational materials provided. Outcome: Progressing   Problem: Education: Goal: Ability to describe self-care measures that may prevent or decrease complications (Diabetes Survival Skills Education) will improve Outcome: Progressing Goal: Individualized Educational Video(s) Outcome: Progressing   Problem: Coping: Goal: Ability to adjust to condition or change in health will improve Outcome: Progressing   Problem: Fluid Volume: Goal: Ability to maintain a balanced intake and output will improve Outcome: Progressing   Problem: Health Behavior/Discharge Planning: Goal: Ability to identify and utilize available resources and services will improve Outcome: Progressing Goal: Ability to manage health-related needs will improve Outcome: Progressing    Problem: Metabolic: Goal: Ability to maintain appropriate glucose levels will improve Outcome: Progressing   Problem: Nutritional: Goal: Maintenance of adequate nutrition will improve Outcome: Progressing Goal: Progress toward achieving an optimal weight will improve Outcome: Progressing   Problem: Skin Integrity: Goal: Risk for impaired skin integrity will decrease Outcome: Progressing   Problem: Tissue Perfusion: Goal: Adequacy of tissue perfusion will improve Outcome: Progressing

## 2024-06-20 NOTE — Progress Notes (Signed)
 Occupational Therapy Weekly Progress Note  Patient Details  Name: [name] Mathews MRN: [id number] Date of Birth: [birth year]  Beginning of progress report period: June 14, 2024 End of progress report period: June 20, 2024  Today's Date: 06/20/2024 OT Individual Time: 1300-1345 OT Individual Time Calculation (min): 45 min    Patient has met 2 of 2 short term goals.  Patient is demonstrating increased functional activity tolerance and functional mobilities demonstrated by decrease in assistance needed in ADLs. Patient demonstrates some difficulty conceptualizing use of AE however able to complete with min to mod verbal cues. Patient has not completed formal caregiver training, however caregiver has been present during several sessions.   Patient continues to demonstrate the following deficits: muscle weakness, decreased cardiorespiratoy endurance, and decreased balance strategies and therefore will continue to benefit from skilled OT intervention to enhance overall performance with BADL.  Patient progressing toward long term goals..  Continue plan of care.  OT Short Term Goals Week 3:  OT Short Term Goal 1 (Week 3): STG=LTG d/t ELOS  Skilled Therapeutic Interventions/Progress Updates:    Patient agreeable to participate in OT session. Reports 4/10 pain level, heat and pain medication offered and declined. Decreased with rest.   Patient participated in skilled OT session focusing on functional use of AE, functional mobility, dynamic balance, and ROM. Patient completed functional mobility approximately 100 ft to gym with rollator. Completed dynamic balance and AE use to pick up objects off of floor with reacher to increase balance and functional use with CGA and verbal cues for safety. Patient completed ROM exercise to grab pins off of back to increase ability to complete posterior hygiene during toileting and functional bathing activities. Patient completed sit to stands x4 to increase  activity tolerance, speed, and ability to complete standing ADLs. Returned to room alarm on all needs in reach.    Therapy Documentation Precautions:  Precautions Precautions: Fall Recall of Precautions/Restrictions: Intact Restrictions Weight Bearing Restrictions Per Provider Order: No   Therapy/Group: Individual Therapy  D'mariea L Elanah Osmanovic 06/20/2024, 7:19 AM

## 2024-06-20 NOTE — Progress Notes (Signed)
 PROGRESS NOTE   Subjective/Complaints: Hip pain doing okay today.  LBM this morning.  Patient's daughter requested to call, I gave her call and we discussed urinary retention and Prolia  medication.  ROS: Patient denies chills, HA,, new vision changes,  nausea, vomiting, diarrhea, shortness of breath, abdominal pain, chest pain +Rt hip pain -improved + Urinary retention  Objective:   DG HIP UNILAT WITH PELVIS 2-3 VIEWS RIGHT Result Date: 06/18/2024 CLINICAL DATA:  Right hip pain EXAM: DG HIP (WITH OR WITHOUT PELVIS) 2-3V RIGHT COMPARISON:  06/30/2009 FINDINGS: Frontal view of the pelvis as well as frontal and cross-table lateral views of the right hip are obtained. The cross-table lateral views are limited due to technique and body habitus. No evidence of acute displaced fracture, subluxation, or dislocation. Symmetrical bilateral hip osteoarthritis. Sacroiliac joints are unremarkable. Postsurgical changes lower lumbar spine. IMPRESSION: 1. No acute displaced fracture. 2. Symmetrical bilateral hip osteoarthritis. Electronically Signed   By: Ozell Daring M.D.   On: 06/18/2024 17:34   Recent Labs    06/19/24 0535  WBC 6.6  HGB 8.9*  HCT 27.1*  PLT 219    Recent Labs    06/19/24 0535  NA 138  K 3.9  CL 104  CO2 23  GLUCOSE 125*  BUN 24*  CREATININE 1.42*  CALCIUM  8.6*    Intake/Output Summary (Last 24 hours) at 06/20/2024 1220 Last data filed at 06/20/2024 0617 Gross per 24 hour  Intake 682 ml  Output 48 ml  Net 634 ml        Physical Exam: Vital Signs Blood pressure (!) 158/62, pulse 61, temperature 98.4 F (36.9 C), temperature source Oral, resp. rate 17, height 5' 7 (1.702 m), weight 98.8 kg, SpO2 100%.  General: No acute distress, sitting in bed this morning HEENT: NCAT, EOMI, oral membranes moist-she has water on the table in front of her Cards: Bradycardic Chest: Clear to auscultation bilaterally,  nonlabored breathing  abdomen: Soft, NT, ND, + BS normal active Skin: No breakdown noted on visible portion of skin Extremities: no edema Psych: pleasant and appropriate  Skin: C/D/I. No apparent lesions.   MSK:      No apparent deformity.  No significant greater trochanter tenderness today       Neurologic exam:  Cognition: AAO x4 Language: Fluent Mood: Very pleasant Sensation: To light touch intact in BL UEs and LEs  CN: 2-12 grossly intact.  Coordination: Bilateral lower extremity tremors with exertion      Strength: Bilateral upper extremities 4/5 proximally, 5/5 distally      Bilateral lower extremities 4-/5 proximally, 4/5 distally Prior neuro assessment is c/w today's exam 06/20/2024.   Assessment/Plan: 1. Functional deficits which require 3+ hours per day of interdisciplinary therapy in a comprehensive inpatient rehab setting. Physiatrist is providing close team supervision and 24 hour management of active medical problems listed below. Physiatrist and rehab team continue to assess barriers to discharge/monitor patient progress toward functional and medical goals  Care Tool:  Bathing    Body parts bathed by patient: Right arm, Left arm, Chest, Abdomen, Right upper leg, Left upper leg, Right lower leg, Left lower leg, Face     Body  parts n/a: Buttocks, Front perineal area, Right lower leg, Left lower leg   Bathing assist Assist Level: Contact Guard/Touching assist     Upper Body Dressing/Undressing Upper body dressing   What is the patient wearing?: Pull over shirt    Upper body assist Assist Level: Set up assist    Lower Body Dressing/Undressing Lower body dressing      What is the patient wearing?: Underwear/pull up, Pants     Lower body assist Assist for lower body dressing: Moderate Assistance - Patient 50 - 74%     Toileting Toileting    Toileting assist Assist for toileting: Maximal Assistance - Patient 25 - 49%     Transfers Chair/bed  transfer  Transfers assist  Chair/bed transfer activity did not occur: Safety/medical concerns  Chair/bed transfer assist level: Contact Guard/Touching assist     Locomotion Ambulation   Ambulation assist      Assist level: Contact Guard/Touching assist Assistive device: Walker-rolling Max distance: 156   Walk 10 feet activity   Assist     Assist level: Contact Guard/Touching assist Assistive device: Walker-rolling   Walk 50 feet activity   Assist Walk 50 feet with 2 turns activity did not occur: Safety/medical concerns  Assist level: Contact Guard/Touching assist Assistive device: Walker-rolling    Walk 150 feet activity   Assist Walk 150 feet activity did not occur: Safety/medical concerns (fatigue)  Assist level: Contact Guard/Touching assist Assistive device: Walker-rolling    Walk 10 feet on uneven surface  activity   Assist Walk 10 feet on uneven surfaces activity did not occur: Safety/medical concerns         Wheelchair     Assist Is the patient using a wheelchair?: Yes Type of Wheelchair: Manual    Wheelchair assist level: Dependent - Patient 0%      Wheelchair 50 feet with 2 turns activity    Assist        Assist Level: Dependent - Patient 0%   Wheelchair 150 feet activity     Assist      Assist Level: Dependent - Patient 0%   Blood pressure (!) 158/62, pulse 61, temperature 98.4 F (36.9 C), temperature source Oral, resp. rate 17, height 5' 7 (1.702 m), weight 98.8 kg, SpO2 100%.  Medical Problem List and Plan: 1. Functional deficits secondary to debility s/p UTI             -patient may shower             -ELOS/Goals: 21-24 days, Min A PT/OT             -Continue CIR therapies including PT, OT    - Expected discharge 8/30  - Discussed bladder function with daughter, plan for Urecholine .  Daughter also asked about her routine Prolia  injections that are due.  I confirmed with with pharmacy unable to get  this inpatient, this will need to be outpatient.  2.  Antithrombotics: -DVT/anticoagulation:  Pharmaceutical: continue Lovenox              -antiplatelet therapy: N/A  3. Pain Management: continue Tylenol  prn.   -8/13 patient like to avoid stronger pain medications.  Having some right hip pain after stairs.  Will try Tylenol  and heating pad for now.  8/14 hip pain doing a little better, continue current regimen for now, we will see how she does with therapy  8/15 discussed scheduling some Tylenol  before therapy, patient declines for now.  She reports overall controlled  8/18 will  schedule Tylenol  in the morning, patient agreeable  8/19 Xray R hip- B/L hip OA, lidocaine  patch  8/21 pain overall under control, continue to monitor   4. Mood/Behavior/Sleep: LCSW to follow for evaluation and support.              -antipsychotic agents: N/A 5. Neuropsych/cognition: This patient is not fully capable of making decisions on her own behalf. 6. Skin/Wound Care: Routine pressure relief meausre.  7. Fluids/Electrolytes/Nutrition: Monitor I/O. Check CMET in am.  8.HTN: Monitor BP TID- continue amlodipine  and hydralazine .     06/20/2024    6:18 AM 06/19/2024    9:47 PM 06/19/2024    8:09 PM  Vitals with BMI  Systolic 158 154 840  Diastolic 62 62 55  Pulse 61 66 63  8/9 increase hydralazine  to 37.5mg  BID 8/10 reports patient had been advised to take amlodipine  at night, medication timing adjusted.  She was also taking nebivolol .  Will hold off on adding this today as we are changing amlodipine  timing.  If BP still elevated tomorrow consider adding low-dose Nebivolol  8/12 will restart Nebivolol  at 2.5 mg  8/13 increase Nebivolol  to 5mg  8/14 BP controlled doing better overall, continue current regimen and monitor  8/17: decrease amlodipine  to 5mg  given diastolic hypotension  - 8/18 fair control continue current regimen and monitor for now  - 8/19 systolic BP intermittently elevated, diastolic a little  low.  Will increase amlodipine  back to 10 mg but decrease Nebivolol  to 2.5 mg  -8/20 systolic blood pressure remains elevated , increase flomax , consider increase Nebivolol  back to 5 mg, monitor HR  8/21 increase Nebivolol  back to 5 mg 9. Anemia of chronic disease: Negative work up by Dr. Freddie and Dr Lanny last month.     - Appears to be gradually trending down on review on records. Check FOBT  -8/13 hemoglobin overall stable at 8.4, discussed FOBT nursing team  -8/14 hemoglobin remained stable at 8.4  -8/18 no FOBT completed yet but hemoglobin appears stable  -8/19 hemoglobin stable at 8.9   10. T2DM: Diet controlled- Monitor BS ac/hs.  8/18 controlled continue to monitor CBG (last 3)  Recent Labs    06/19/24 2131 06/20/24 0615 06/20/24 1126  GLUCAP 144* 124* 145*    11. H/o UTIs/Urinary retention: Question due to Merbetriq--d/c a couple of days ago?  Appears this.  For urinary retention.  Foley replaced. Will remove and start bladder program             -monitor voiding with PVR/bladder scan.   -Will ask nursing to get some PVRs  8/14 I see 1 bladder scan yesterday for  433 mL, unsure if this is before or after she voided.  Will asked nursing to document PVRs  8/15 bladder scan of 270, looks like she had to void a few minutes prior.  She is currently on Flomax .  Will asked nursing to check a few more.  If continues consider Urecholine   8/18 will add additional nursing order regarding PVR/bladder scans, do not see any additional ones completed  -8/20 PVRs remain mildly elevated, increase flomax  to 0.8mg , she is declining IC  -8/21 Will start urocholine 5mg  TID 12. Dementia: Continue Donepezil .   13. Constipation: Had enema yesterday after no BM X one week  - 8/10 lactulose  changed from daily to as needed, had a large bowel movement yesterday  8/11 last bm today. Pt/daughter satisfied with current regimen   8/13 LBM documented 6/11, will add prn Miralax  for mild  constipation,  encourage oral fluid intake  8/20 Miralax  34g , LBM 8/17  8/21 LBM today 14.  CKD 3b: Monitor with serial checks as well as UOP/voiding   - 8/9 creatinine 1.39, overall stable continue to monitor   8/11-a slight bump in BUN/Cr today (20/1.52).  I'm not too concerned at present. WIll encourage PO and follow up lab work on Wednesday.   8/14 Cr little higher at 1.58/BUN 25, still around overall baseline, continue encourage oral fluids again today  8/15 discussed oral fluid intake, continue to monitor labs.  If increases further consider some short-term IV fluids  8/16 Cr reviewed and is mildly improved  8/18 creatinine a little higher, will start some gentle IVF  8/20 BUN 24, Cr down to 1.42, recheck Friday, DC IVF. Encourage oral fluids 15. Obesity. Class 2. Dietary counseling   -Body mass index is 34.11 kg/m.     LOS: 13 days A FACE TO FACE EVALUATION WAS PERFORMED  Murray Collier 06/20/2024, 12:20 PM

## 2024-06-21 DIAGNOSIS — I10 Essential (primary) hypertension: Secondary | ICD-10-CM | POA: Diagnosis not present

## 2024-06-21 DIAGNOSIS — K5900 Constipation, unspecified: Secondary | ICD-10-CM | POA: Diagnosis not present

## 2024-06-21 DIAGNOSIS — R5381 Other malaise: Secondary | ICD-10-CM | POA: Diagnosis not present

## 2024-06-21 DIAGNOSIS — N189 Chronic kidney disease, unspecified: Secondary | ICD-10-CM | POA: Diagnosis not present

## 2024-06-21 LAB — CBC
HCT: 25.1 % — ABNORMAL LOW (ref 36.0–46.0)
Hemoglobin: 8.3 g/dL — ABNORMAL LOW (ref 12.0–15.0)
MCH: 29.5 pg (ref 26.0–34.0)
MCHC: 33.1 g/dL (ref 30.0–36.0)
MCV: 89.3 fL (ref 80.0–100.0)
Platelets: 208 K/uL (ref 150–400)
RBC: 2.81 MIL/uL — ABNORMAL LOW (ref 3.87–5.11)
RDW: 15 % (ref 11.5–15.5)
WBC: 6 K/uL (ref 4.0–10.5)
nRBC: 0 % (ref 0.0–0.2)

## 2024-06-21 LAB — BASIC METABOLIC PANEL WITH GFR
Anion gap: 8 (ref 5–15)
BUN: 28 mg/dL — ABNORMAL HIGH (ref 8–23)
CO2: 22 mmol/L (ref 22–32)
Calcium: 8.4 mg/dL — ABNORMAL LOW (ref 8.9–10.3)
Chloride: 107 mmol/L (ref 98–111)
Creatinine, Ser: 1.54 mg/dL — ABNORMAL HIGH (ref 0.44–1.00)
GFR, Estimated: 32 mL/min — ABNORMAL LOW (ref >60)
Glucose, Bld: 107 mg/dL — ABNORMAL HIGH (ref 70–99)
Potassium: 4.1 mmol/L (ref 3.5–5.1)
Sodium: 137 mmol/L (ref 135–145)

## 2024-06-21 LAB — GLUCOSE, CAPILLARY
Glucose-Capillary: 107 mg/dL — ABNORMAL HIGH (ref 70–99)
Glucose-Capillary: 144 mg/dL — ABNORMAL HIGH (ref 70–99)
Glucose-Capillary: 160 mg/dL — ABNORMAL HIGH (ref 70–99)
Glucose-Capillary: 156 mg/dL — ABNORMAL HIGH (ref 70–99)

## 2024-06-21 MED ORDER — BETHANECHOL CHLORIDE 10 MG PO TABS
10.0000 mg | ORAL_TABLET | Freq: Three times a day (TID) | ORAL | Status: DC
  Administered 2024-06-21 – 2024-06-27 (×17): 10 mg via ORAL
  Filled 2024-06-21 (×17): qty 1

## 2024-06-21 NOTE — Progress Notes (Addendum)
 Physical Therapy Session Note  Patient Details  Name: [name] Mathews MRN: [id number] Date of Birth: 10-07-1934  Today's Date: 06/21/2024 PT Individual Time: 1100-1145 PT Individual Time Calculation (min): 45 min  Today's Date: 06/21/2024 PT Missed Time: 15 Minutes Missed Time Reason: Patient fatigue  Short Term Goals: Week 2:  PT Short Term Goal 1 (Week 2): pt will navigate 17 6 inch steps with B HR and min A PT Short Term Goal 2 (Week 2): pt perform sit to stand with LRAD and CGA consistently PT Short Term Goal 3 (Week 2): pt will ambulate 150 feet with LRAD and CGA  Skilled Therapeutic Interventions/Progress Updates: Patient sitting on Kinetron in day room with PT hand off. Patient alert and agreeable to PT session to best ability as pt reported fatigue.   Patient reported unrated pain in hip (R superior glute). Palpation of area performed with trigger points present. Pt denied manual therapy after education due to pain of trigger point release technique. Piriformis (modified) stretch performed.  Therapeutic Activity: Transfers: Pt performed sit<>stand transfers throughout session with close supervision to rollator. Provided VC for engaging locks when needing to sit on rollator/transferring from<>to mat and reaching back to surfaces to control descent.  Therapeutic Exercise: Pt performed the following exercises with therapist providing the described cuing and facilitation for improvement. - LAQ B LE 5lb ankle weight donned. 2 x 10 with 1-2 second holds in extension and VC to control eccentric - Piriformis stretch to R sitting edge of mat with PTA assisting stretch (pt unable to place LE on contralateral knee) 3 x 15  second hold in stretch to pt tolerance - Seated hip external rotation + hip flexion to target piriformis (assisted to increase ROM) - Pt ambulated 99' x 1, 76' x 1, and back to room at end of session following rest breaks (roughly 130') in rollator in order to improve  cardiovascular/B LE endurance. Pt with close supervision/CGA with VC to decrease WB on B UE's and to increase upright posture with education provided on safety.   Patient sitting in recliner at end of session with brakes locked, chair alarm set, and all needs within reach.      Therapy Documentation Precautions:  Precautions Precautions: Fall Recall of Precautions/Restrictions: Intact Restrictions Weight Bearing Restrictions Per Provider Order: No  Therapy/Group: Individual Therapy  Sumner Boesch PTA 06/21/2024, 12:02 PM

## 2024-06-21 NOTE — Plan of Care (Signed)
  Problem: Consults Goal: RH GENERAL PATIENT EDUCATION Description: See Patient Education module for education specifics. Outcome: Progressing   Problem: RH BOWEL ELIMINATION Goal: RH STG MANAGE BOWEL WITH ASSISTANCE Description: STG Manage Bowel with Assistance. Outcome: Progressing   Problem: RH BLADDER ELIMINATION Goal: RH STG MANAGE BLADDER WITH ASSISTANCE Description: STG Manage Bladder With minimal  Assistance Outcome: Progressing   Problem: RH SKIN INTEGRITY Goal: RH STG SKIN FREE OF INFECTION/BREAKDOWN Description: Manage skin free of infection/breakdown with minimal assistance Outcome: Progressing   Problem: RH SAFETY Goal: RH STG ADHERE TO SAFETY PRECAUTIONS W/ASSISTANCE/DEVICE Description: STG Adhere to Safety Precautions With minimal assistance Assistance/Device. Outcome: Progressing   Problem: RH PAIN MANAGEMENT Goal: RH STG PAIN MANAGED AT OR BELOW PT'S PAIN GOAL Outcome: Progressing   Problem: RH KNOWLEDGE DEFICIT GENERAL Goal: RH STG INCREASE KNOWLEDGE OF SELF CARE AFTER HOSPITALIZATION Description: Manage increase knowledge of self care after hospitalization with minimal assistance from daughter using educational materials provided. Outcome: Progressing   Problem: Education: Goal: Ability to describe self-care measures that may prevent or decrease complications (Diabetes Survival Skills Education) will improve Outcome: Progressing Goal: Individualized Educational Video(s) Outcome: Progressing   Problem: Coping: Goal: Ability to adjust to condition or change in health will improve Outcome: Progressing   Problem: Fluid Volume: Goal: Ability to maintain a balanced intake and output will improve Outcome: Progressing   Problem: Health Behavior/Discharge Planning: Goal: Ability to identify and utilize available resources and services will improve Outcome: Progressing Goal: Ability to manage health-related needs will improve Outcome: Progressing

## 2024-06-21 NOTE — Progress Notes (Signed)
 Physical Therapy Session Note  Patient Details  Name: [name] Mathews MRN: [id number] Date of Birth: [birth year]  Today's Date: 06/21/2024 PT Individual Time: 1017-1100 PT Individual Time Calculation (min): 43 min   Short Term Goals: Week 2:  PT Short Term Goal 1 (Week 2): pt will navigate 17 6 inch steps with B HR and min A PT Short Term Goal 2 (Week 2): pt perform sit to stand with LRAD and CGA consistently PT Short Term Goal 3 (Week 2): pt will ambulate 150 feet with LRAD and CGA  Skilled Therapeutic Interventions/Progress Updates: Pt presented in recliner agreeable to therapy, pt states unrated R hip pain, no intervention requested during session. Session focused on stair training and general conditioning. Pt stood from recliner with supervision and ambulated to main gym with rollator with supervision. After seated rest pt ascended x 12 steps B rails before seated rest. Pt then ascended/descended an additional x 4 steps then directly ambulated to day room with rollator and supervision. Pt then participated in Cybex Kinetron at 70cm/sec 2 min on/2 min off x 3 rounds. Pt handed off to PTA for next session.      Therapy Documentation Precautions:  Precautions Precautions: Fall Recall of Precautions/Restrictions: Intact Restrictions Weight Bearing Restrictions Per Provider Order: No General: PT Amount of Missed Time (min): 15 Minutes PT Missed Treatment Reason: Patient fatigue Vital Signs:   Pain: Pain Assessment Pain Scale: 0-10 Pain Score: 0-No pain Pain Location: Other (Comment)     Therapy/Group: Individual Therapy  Nikolaj Geraghty 06/21/2024, 12:18 PM

## 2024-06-21 NOTE — Progress Notes (Signed)
 PROGRESS NOTE   Subjective/Complaints: Hip pain remains under control today.  Patient sitting in bedside chair.  No new concerns elicited.  ROS: Patient denies chills, HA,, new vision changes,  nausea, vomiting, diarrhea, shortness of breath, abdominal pain, chest pain +Rt hip pain -improved + Urinary retention Denies dysuria  Objective:   No results found.  Recent Labs    06/19/24 0535 06/21/24 0614  WBC 6.6 6.0  HGB 8.9* 8.3*  HCT 27.1* 25.1*  PLT 219 208    Recent Labs    06/19/24 0535 06/21/24 0614  NA 138 137  K 3.9 4.1  CL 104 107  CO2 23 22  GLUCOSE 125* 107*  BUN 24* 28*  CREATININE 1.42* 1.54*  CALCIUM  8.6* 8.4*    Intake/Output Summary (Last 24 hours) at 06/21/2024 1654 Last data filed at 06/21/2024 1250 Gross per 24 hour  Intake 610 ml  Output --  Net 610 ml        Physical Exam: Vital Signs Blood pressure (!) 151/55, pulse (!) 57, temperature 98.7 F (37.1 C), resp. rate 18, height 5' 7 (1.702 m), weight 98.8 kg, SpO2 100%.  General: No acute distress, sitting in bedside chair HEENT: NCAT, EOMI, oral membranes moist Cards: Bradycardic Chest: Clear to auscultation bilaterally, nonlabored breathing  abdomen: Soft, NT, ND, + BS normal active Skin: No breakdown noted on visible portion of skin Extremities: no edema Psych: pleasant and appropriate, very pleasant Skin: C/D/I. No apparent lesions.   MSK:      No apparent deformity.  No significant greater trochanter tenderness today       Neurologic exam: Cranial nerves II through XII grossly intact, alert and oriented x 4, fluent, sensation intact light touch in all 4 extremities       Strength: Bilateral upper extremities 4/5 proximally, 5/5 distally      Bilateral lower extremities 4/5 proximally, 4/5 distally Prior neuro assessment is c/w today's exam 06/21/2024.   Assessment/Plan: 1. Functional deficits which require 3+ hours  per day of interdisciplinary therapy in a comprehensive inpatient rehab setting. Physiatrist is providing close team supervision and 24 hour management of active medical problems listed below. Physiatrist and rehab team continue to assess barriers to discharge/monitor patient progress toward functional and medical goals  Care Tool:  Bathing    Body parts bathed by patient: Right arm, Left arm, Chest, Abdomen, Right upper leg, Left upper leg, Right lower leg, Left lower leg, Face     Body parts n/a: Buttocks, Front perineal area, Right lower leg, Left lower leg   Bathing assist Assist Level: Contact Guard/Touching assist     Upper Body Dressing/Undressing Upper body dressing   What is the patient wearing?: Pull over shirt    Upper body assist Assist Level: Set up assist    Lower Body Dressing/Undressing Lower body dressing      What is the patient wearing?: Underwear/pull up, Pants     Lower body assist Assist for lower body dressing: Moderate Assistance - Patient 50 - 74%     Toileting Toileting    Toileting assist Assist for toileting: Maximal Assistance - Patient 25 - 49%     Transfers Chair/bed  transfer  Transfers assist  Chair/bed transfer activity did not occur: Safety/medical concerns  Chair/bed transfer assist level: Contact Guard/Touching assist     Locomotion Ambulation   Ambulation assist      Assist level: Contact Guard/Touching assist Assistive device: Walker-rolling Max distance: 156   Walk 10 feet activity   Assist     Assist level: Contact Guard/Touching assist Assistive device: Walker-rolling   Walk 50 feet activity   Assist Walk 50 feet with 2 turns activity did not occur: Safety/medical concerns  Assist level: Contact Guard/Touching assist Assistive device: Walker-rolling    Walk 150 feet activity   Assist Walk 150 feet activity did not occur: Safety/medical concerns (fatigue)  Assist level: Contact Guard/Touching  assist Assistive device: Walker-rolling    Walk 10 feet on uneven surface  activity   Assist Walk 10 feet on uneven surfaces activity did not occur: Safety/medical concerns         Wheelchair     Assist Is the patient using a wheelchair?: Yes Type of Wheelchair: Manual    Wheelchair assist level: Dependent - Patient 0%      Wheelchair 50 feet with 2 turns activity    Assist        Assist Level: Dependent - Patient 0%   Wheelchair 150 feet activity     Assist      Assist Level: Dependent - Patient 0%   Blood pressure (!) 151/55, pulse (!) 57, temperature 98.7 F (37.1 C), resp. rate 18, height 5' 7 (1.702 m), weight 98.8 kg, SpO2 100%.  Medical Problem List and Plan: 1. Functional deficits secondary to debility s/p UTI             -patient may shower             -ELOS/Goals: 21-24 days, Min A PT/OT             -Continue CIR therapies including PT, OT    - Expected discharge 8/30  - Discussed bladder function with daughter, plan for Urecholine .  Daughter also asked about her routine Prolia  injections that are due.  I confirmed with with pharmacy unable to get this inpatient, this will need to be outpatient.  2.  Antithrombotics: -DVT/anticoagulation:  Pharmaceutical: continue Lovenox              -antiplatelet therapy: N/A  3. Pain Management: continue Tylenol  prn.   -8/13 patient like to avoid stronger pain medications.  Having some right hip pain after stairs.  Will try Tylenol  and heating pad for now.  8/14 hip pain doing a little better, continue current regimen for now, we will see how she does with therapy  8/15 discussed scheduling some Tylenol  before therapy, patient declines for now.  She reports overall controlled  8/18 will schedule Tylenol  in the morning, patient agreeable  8/19 Xray R hip- B/L hip OA, lidocaine  patch  8/21-22 pain overall under control, continue to monitor   4. Mood/Behavior/Sleep: LCSW to follow for evaluation and  support.              -antipsychotic agents: N/A 5. Neuropsych/cognition: This patient is not fully capable of making decisions on her own behalf. 6. Skin/Wound Care: Routine pressure relief meausre.  7. Fluids/Electrolytes/Nutrition: Monitor I/O. Check CMET in am.  8.HTN: Monitor BP TID- continue amlodipine  and hydralazine .     06/21/2024   12:32 PM 06/21/2024    6:19 AM 06/20/2024    7:57 PM  Vitals with BMI  Systolic 151 158  132  Diastolic 55 53 66  Pulse 57 59 63  8/9 increase hydralazine  to 37.5mg  BID 8/10 reports patient had been advised to take amlodipine  at night, medication timing adjusted.  She was also taking nebivolol .  Will hold off on adding this today as we are changing amlodipine  timing.  If BP still elevated tomorrow consider adding low-dose Nebivolol  8/12 will restart Nebivolol  at 2.5 mg  8/13 increase Nebivolol  to 5mg  8/14 BP controlled doing better overall, continue current regimen and monitor  8/17: decrease amlodipine  to 5mg  given diastolic hypotension  - 8/18 fair control continue current regimen and monitor for now  - 8/19 systolic BP intermittently elevated, diastolic a little low.  Will increase amlodipine  back to 10 mg but decrease Nebivolol  to 2.5 mg  -8/20 systolic blood pressure remains elevated , increase flomax , consider increase Nebivolol  back to 5 mg, monitor HR  8/21 increase Nebivolol  back to 5 mg  8/22 fair BP control, continue to monitor trend 9. Anemia of chronic disease: Negative work up by Dr. Freddie and Dr Lanny last month.     - Appears to be gradually trending down on review on records. Check FOBT  -8/13 hemoglobin overall stable at 8.4, discussed FOBT nursing team  -8/14 hemoglobin remained stable at 8.4  -8/18 no FOBT completed yet but hemoglobin appears stable  -8/22 hemoglobin overall stable at 8.3, continue to monitor   10. T2DM: Diet controlled- Monitor BS ac/hs.  Change CBGs to just daily CBG (last 3)  Recent Labs     06/20/24 2106 06/21/24 0614 06/21/24 1212  GLUCAP 115* 107* 144*    11. H/o UTIs/Urinary retention: Question due to Merbetriq--d/c a couple of days ago?  Appears this.  For urinary retention.  Foley replaced. Will remove and start bladder program             -monitor voiding with PVR/bladder scan.   -Will ask nursing to get some PVRs  8/14 I see 1 bladder scan yesterday for  433 mL, unsure if this is before or after she voided.  Will asked nursing to document PVRs  8/15 bladder scan of 270, looks like she had to void a few minutes prior.  She is currently on Flomax .  Will asked nursing to check a few more.  If continues consider Urecholine   8/18 will add additional nursing order regarding PVR/bladder scans, do not see any additional ones completed  -8/20 PVRs remain mildly elevated, increase flomax  to 0.8mg , she is declining IC  -8/22 Urecholine  started yesterday, appears may be helping last PVR of 27, although prior to PVRs were in the low 200s.  Will increase Urecholine  to 10 mg 3 times daily 12. Dementia: Continue Donepezil .   13. Constipation: Had enema yesterday after no BM X one week  - 8/10 lactulose  changed from daily to as needed, had a large bowel movement yesterday  8/11 last bm today. Pt/daughter satisfied with current regimen   8/13 LBM documented 6/11, will add prn Miralax  for mild constipation, encourage oral fluid intake  8/20 Miralax  34g , LBM 8/17  8/22 no BM yesterday continue to monitor 14.  CKD 3b: Monitor with serial checks as well as UOP/voiding   - 8/9 creatinine 1.39, overall stable continue to monitor   8/11-a slight bump in BUN/Cr today (20/1.52).  I'm not too concerned at present. WIll encourage PO and follow up lab work on Wednesday.   8/14 Cr little higher at 1.58/BUN 25, still around overall baseline, continue encourage oral  fluids again today  8/15 discussed oral fluid intake, continue to monitor labs.  If increases further consider some short-term IV  fluids  8/16 Cr reviewed and is mildly improved  8/18 creatinine a little higher, will start some gentle IVF  8/20 BUN 24, Cr down to 1.42, recheck Friday, DC IVF. Encourage oral fluids  8/22 creatinine and BUN a little higher today at 1.5/28, continue to encourage oral fluids 15. Obesity. Class 2. Dietary counseling   -Body mass index is 34.11 kg/m.     LOS: 14 days A FACE TO FACE EVALUATION WAS PERFORMED  Murray Collier 06/21/2024, 4:54 PM

## 2024-06-21 NOTE — Progress Notes (Signed)
 Occupational Therapy Session Note  Patient Details  Name: [name] Mathews MRN: [id number] Date of Birth: 19-Sep-1934  Session 1 Today's Date: 06/21/2024 OT Individual Time: 9153-9083 OT Individual Time Calculation (min): 30 min   Session 2 Today's Date: 06/21/2024 OT Individual Time: 1300-1415 OT Individual Time Calculation (min): 75 min    Short Term Goals: Week 3:  OT Short Term Goal 1 (Week 3): STG=LTG d/t ELOS   Session 1: Skilled Therapeutic Interventions/Progress Updates:   Patient agreeable to participate in OT session. Reports no pain level.   Patient participated in skilled OT session focusing on ADL completion. Patient completed UB dressing SU, completed underwear donning min A with reacher with min verbal cues. Bed mobility Sup. Completed toilet transfer CGA to sup, toileting CGA. Patient completed functional mobility back to chair with SUP. all needs in reach alarm on.  Session 1: Skilled Therapeutic Interventions/Progress Updates:   Patient agreeable to participate in OT session. Reports no pain level.   Patient participated in skilled OT session focusing on functional mobility, functional activity tolerance, dynamic balance. Patient completed functional mobility 150 ft before rest with rollator. Completed 125 ft with rollator with SUP A. Required increased rest following functional mobility to increase ability to complete ADLs safely. Completed NuStep UBE to increase UE strength, ROM, and activity tolerance while maintaining constant speed and completing 10 minutes before requiring rest break. . Therapist and patient completed postural balance activity utilizing BITS balance. Completed pants changing due to patient believing brief was wet. Returned to bed all needs in reach.   Therapy Documentation Precautions:  Precautions Precautions: Fall Recall of Precautions/Restrictions: Intact Restrictions Weight Bearing Restrictions Per Provider Order: Yes  Therapy/Group:  Individual Therapy  [name] 06/21/2024, 7:16 AM

## 2024-06-22 DIAGNOSIS — I10 Essential (primary) hypertension: Secondary | ICD-10-CM | POA: Diagnosis not present

## 2024-06-22 DIAGNOSIS — R5381 Other malaise: Secondary | ICD-10-CM | POA: Diagnosis not present

## 2024-06-22 DIAGNOSIS — R739 Hyperglycemia, unspecified: Secondary | ICD-10-CM

## 2024-06-22 DIAGNOSIS — K5901 Slow transit constipation: Secondary | ICD-10-CM | POA: Diagnosis not present

## 2024-06-22 LAB — GLUCOSE, CAPILLARY
Glucose-Capillary: 113 mg/dL — ABNORMAL HIGH (ref 70–99)
Glucose-Capillary: 178 mg/dL — ABNORMAL HIGH (ref 70–99)

## 2024-06-22 NOTE — Progress Notes (Signed)
 PROGRESS NOTE   Subjective/Complaints:  Pt doing well, slept well, denies pain currently, LBM yesterday and again this morning, urinating fine. No other complaints or concerns.   ROS: as per HPI. Denies CP, SOB, abd pain, N/V/D/C, or any other complaints at this time.   +Rt hip pain -improved + Urinary retention-improving Denies dysuria  Objective:   No results found.  Recent Labs    06/21/24 0614  WBC 6.0  HGB 8.3*  HCT 25.1*  PLT 208    Recent Labs    06/21/24 0614  NA 137  K 4.1  CL 107  CO2 22  GLUCOSE 107*  BUN 28*  CREATININE 1.54*  CALCIUM  8.4*    Intake/Output Summary (Last 24 hours) at 06/22/2024 1219 Last data filed at 06/22/2024 0925 Gross per 24 hour  Intake 476 ml  Output --  Net 476 ml        Physical Exam: Vital Signs Blood pressure (!) 146/54, pulse (!) 59, temperature 98.7 F (37.1 C), resp. rate 16, height 5' 7 (1.702 m), weight 98.8 kg, SpO2 99%.  General: No acute distress, sitting in w/c working with OT HEENT: NCAT, EOMI, oral membranes moist Cards: Bradycardic but no m/r/g Chest: Clear to auscultation bilaterally, nonlabored breathing  abdomen: Soft, NT, ND, + BS normal active Skin: No breakdown noted on visible portion of skin Extremities: no edema Psych: pleasant and appropriate  PRIOR EXAMS: MSK:      No apparent deformity.  No significant greater trochanter tenderness today       Neurologic exam: Cranial nerves II through XII grossly intact, alert and oriented x 4, fluent, sensation intact light touch in all 4 extremities       Strength: Bilateral upper extremities 4/5 proximally, 5/5 distally      Bilateral lower extremities 4/5 proximally, 4/5 distally   Assessment/Plan: 1. Functional deficits which require 3+ hours per day of interdisciplinary therapy in a comprehensive inpatient rehab setting. Physiatrist is providing close team supervision and 24 hour  management of active medical problems listed below. Physiatrist and rehab team continue to assess barriers to discharge/monitor patient progress toward functional and medical goals  Care Tool:  Bathing    Body parts bathed by patient: Right arm, Left arm, Chest, Abdomen, Right upper leg, Left upper leg, Right lower leg, Left lower leg, Face     Body parts n/a: Buttocks, Front perineal area, Right lower leg, Left lower leg   Bathing assist Assist Level: Contact Guard/Touching assist     Upper Body Dressing/Undressing Upper body dressing   What is the patient wearing?: Pull over shirt    Upper body assist Assist Level: Set up assist    Lower Body Dressing/Undressing Lower body dressing      What is the patient wearing?: Underwear/pull up, Pants     Lower body assist Assist for lower body dressing: Moderate Assistance - Patient 50 - 74%     Toileting Toileting    Toileting assist Assist for toileting: Maximal Assistance - Patient 25 - 49%     Transfers Chair/bed transfer  Transfers assist  Chair/bed transfer activity did not occur: Safety/medical concerns  Chair/bed transfer assist level: Contact  Guard/Touching assist     Locomotion Ambulation   Ambulation assist      Assist level: Contact Guard/Touching assist Assistive device: Walker-rolling Max distance: 156   Walk 10 feet activity   Assist     Assist level: Contact Guard/Touching assist Assistive device: Walker-rolling   Walk 50 feet activity   Assist Walk 50 feet with 2 turns activity did not occur: Safety/medical concerns  Assist level: Contact Guard/Touching assist Assistive device: Walker-rolling    Walk 150 feet activity   Assist Walk 150 feet activity did not occur: Safety/medical concerns (fatigue)  Assist level: Contact Guard/Touching assist Assistive device: Walker-rolling    Walk 10 feet on uneven surface  activity   Assist Walk 10 feet on uneven surfaces activity did  not occur: Safety/medical concerns         Wheelchair     Assist Is the patient using a wheelchair?: Yes Type of Wheelchair: Manual    Wheelchair assist level: Dependent - Patient 0%      Wheelchair 50 feet with 2 turns activity    Assist        Assist Level: Dependent - Patient 0%   Wheelchair 150 feet activity     Assist      Assist Level: Dependent - Patient 0%   Blood pressure (!) 146/54, pulse (!) 59, temperature 98.7 F (37.1 C), resp. rate 16, height 5' 7 (1.702 m), weight 98.8 kg, SpO2 99%.  Medical Problem List and Plan: 1. Functional deficits secondary to debility s/p UTI             -patient may shower             -ELOS/Goals: 21-24 days, Min A PT/OT             -Continue CIR therapies including PT, OT    - Expected discharge 8/30 - Discussed bladder function with daughter, plan for Urecholine .  Daughter also asked about her routine Prolia  injections that are due.  I confirmed with with pharmacy unable to get this inpatient, this will need to be outpatient.  2.  Antithrombotics: -DVT/anticoagulation:  Pharmaceutical: continue Lovenox  30mg  daily             -antiplatelet therapy: N/A  3. Pain Management: continue Tylenol  prn.  -8/13 patient like to avoid stronger pain medications.  Having some right hip pain after stairs.  Will try Tylenol  and heating pad for now. 8/14 hip pain doing a little better, continue current regimen for now, we will see how she does with therapy 8/15 discussed scheduling some Tylenol  before therapy, patient declines for now.  She reports overall controlled  8/18 will schedule Tylenol  in the morning, patient agreeable  8/19 Xray R hip- B/L hip OA, lidocaine  patch  8/21-22 pain overall under control, continue to monitor   4. Mood/Behavior/Sleep: LCSW to follow for evaluation and support.              -antipsychotic agents: N/A 5. Neuropsych/cognition: This patient is not fully capable of making decisions on her own  behalf. 6. Skin/Wound Care: Routine pressure relief meausre.  7. Fluids/Electrolytes/Nutrition: Monitor I/O. Check CMET in am.  8.HTN: Monitor BP TID- continue amlodipine  and hydralazine .  8/9 increase hydralazine  to 37.5mg  BID 8/10 reports patient had been advised to take amlodipine  at night, medication timing adjusted.  She was also taking nebivolol .  Will hold off on adding this today as we are changing amlodipine  timing.  If BP still elevated tomorrow consider adding low-dose  Nebivolol  8/12 will restart Nebivolol  at 2.5 mg  8/13 increase Nebivolol  to 5mg  8/14 BP controlled doing better overall, continue current regimen and monitor  8/17: decrease amlodipine  to 5mg  given diastolic hypotension  - 8/18 fair control continue current regimen and monitor for now - 8/19 systolic BP intermittently elevated, diastolic a little low.  Will increase amlodipine  back to 10 mg but decrease Nebivolol  to 2.5 mg -8/20 systolic blood pressure remains elevated , increase flomax , consider increase Nebivolol  back to 5 mg, monitor HR  8/21 increase Nebivolol  back to 5 mg  8/22 fair BP control, continue to monitor trend  -06/22/24 BP a little up still, but just changed meds again; monitor trend Vitals:   06/18/24 2200 06/19/24 0613 06/19/24 1231 06/19/24 2009  BP: (!) 169/64 (!) 173/58 (!) 146/55 (!) 159/55   06/19/24 2147 06/20/24 0618 06/20/24 1614 06/20/24 1957  BP: (!) 154/62 (!) 158/62 (!) 144/49 132/66   06/21/24 0619 06/21/24 1232 06/21/24 2058 06/22/24 0455  BP: (!) 158/53 (!) 151/55 (!) 155/57 (!) 146/54    9. Anemia of chronic disease: Negative work up by Dr. Freddie and Dr Lanny last month.     - Appears to be gradually trending down on review on records. Check FOBT  -8/13 hemoglobin overall stable at 8.4, discussed FOBT nursing team  -8/14 hemoglobin remained stable at 8.4  -8/18 no FOBT completed yet but hemoglobin appears stable  -8/22 hemoglobin overall stable at 8.3, continue to  monitor   10. T2DM: Diet controlled- Monitor BS ac/hs.  Change CBGs to just daily  -06/22/24 still getting CBGs 4x/d, will alert nursing staff. CBGs <200 CBG (last 3)  Recent Labs    06/21/24 2146 06/22/24 0601 06/22/24 1121  GLUCAP 156* 113* 178*    11. H/o UTIs/Urinary retention: Question due to Merbetriq--d/c a couple of days ago?  Appears this.  For urinary retention.  Foley replaced. Will remove and start bladder program             -monitor voiding with PVR/bladder scan.   -Will ask nursing to get some PVRs 8/14 I see 1 bladder scan yesterday for  433 mL, unsure if this is before or after she voided.  Will asked nursing to document PVRs 8/15 bladder scan of 270, looks like she had to void a few minutes prior.  She is currently on Flomax .  Will asked nursing to check a few more.  If continues consider Urecholine  8/18 will add additional nursing order regarding PVR/bladder scans, do not see any additional ones completed -8/20 PVRs remain mildly elevated, increase flomax  to 0.8mg , she is declining IC -8/22 Urecholine  started yesterday, appears may be helping last PVR of 27, although prior to PVRs were in the low 200s.  Will increase Urecholine  to 10 mg 3 times daily -06/22/24 not many PVRs, but all fairly low, monitor  12. Dementia: Continue Donepezil .   13. Constipation: Had enema yesterday after no BM X one week - 8/10 lactulose  changed from daily to as needed, had a large bowel movement yesterday  8/11 last bm today. Pt/daughter satisfied with current regimen  8/13 LBM documented 6/11, will add prn Miralax  for mild constipation, encourage oral fluid intake  8/20 Miralax  34g , LBM 8/17  8/22 no BM yesterday continue to monitor  -06/22/24 LBM this morning, cont regimen  14.  CKD 3b: Monitor with serial checks as well as UOP/voiding   - 8/9 creatinine 1.39, overall stable continue to monitor  8/11-a slight bump in  BUN/Cr today (20/1.52).  I'm not too concerned at present. WIll  encourage PO and follow up lab work on Wednesday.  8/14 Cr little higher at 1.58/BUN 25, still around overall baseline, continue encourage oral fluids again today 8/15 discussed oral fluid intake, continue to monitor labs.  If increases further consider some short-term IV fluids  8/16 Cr reviewed and is mildly improved  8/18 creatinine a little higher, will start some gentle IVF 8/20 BUN 24, Cr down to 1.42, recheck Friday, DC IVF. Encourage oral fluids 8/22 creatinine and BUN a little higher today at 1.5/28, continue to encourage oral fluids  15. Obesity. Class 2. Dietary counseling   -Body mass index is 34.11 kg/m.     LOS: 15 days A FACE TO FACE EVALUATION WAS PERFORMED  [address] Coffee Alexie Lanni 06/22/2024, 12:19 PM

## 2024-06-22 NOTE — Progress Notes (Signed)
 Occupational Therapy Session Note  Patient Details  Name: OVIYA AMMAR MRN: [id number] Date of Birth: 1934/04/03  Today's Date: 06/22/2024 OT Individual Time: 0848-1000 OT Individual Time Calculation (min): 72 min    Short Term Goals: Week 3:  OT Short Term Goal 1 (Week 3): STG=LTG d/t ELOS  Skilled Therapeutic Interventions/Progress Updates:  Patient agreeable to participate in OT session. Reports no pain level.   Patient participated in skilled OT session focusing on self care completion. Patient found in chair completing eating breakfast IND. Patient completed UB , LB bathing with close SUP to CG. Patient completed UB dressing SU, LB dressing close SUP. Declined to use bathroom at this time. Patient demonstrated increased activity tolerance, completing ADL routine with less rest breaks. Patient completed functional mobility for 150 ft following ADL completion with increased rest break . Patient returned to recliner with alarm on all needs in reach.    Therapy Documentation Precautions:  Precautions Precautions: Fall Recall of Precautions/Restrictions: Intact Restrictions Weight Bearing Restrictions Per Provider Order: Yes  Therapy/Group: Individual Therapy  D'mariea L Sarra Rachels 06/22/2024, 7:25 AM

## 2024-06-22 NOTE — Plan of Care (Signed)
  Problem: Consults Goal: RH GENERAL PATIENT EDUCATION Description: See Patient Education module for education specifics. Outcome: Progressing   Problem: RH BOWEL ELIMINATION Goal: RH STG MANAGE BOWEL WITH ASSISTANCE Description: STG Manage Bowel with Assistance. Outcome: Progressing   Problem: RH BLADDER ELIMINATION Goal: RH STG MANAGE BLADDER WITH ASSISTANCE Description: STG Manage Bladder With minimal  Assistance Outcome: Progressing   Problem: RH SKIN INTEGRITY Goal: RH STG SKIN FREE OF INFECTION/BREAKDOWN Description: Manage skin free of infection/breakdown with minimal assistance Outcome: Progressing   Problem: RH SAFETY Goal: RH STG ADHERE TO SAFETY PRECAUTIONS W/ASSISTANCE/DEVICE Description: STG Adhere to Safety Precautions With minimal assistance Assistance/Device. Outcome: Progressing

## 2024-06-22 NOTE — Progress Notes (Signed)
 Per [facility], [place]. CBG's to be taken only in the morning (6am). New orders placed.   Geni Armor, LPN

## 2024-06-22 NOTE — Plan of Care (Signed)
  Problem: Consults Goal: RH GENERAL PATIENT EDUCATION Description: See Patient Education module for education specifics. Outcome: Progressing   Problem: RH BOWEL ELIMINATION Goal: RH STG MANAGE BOWEL WITH ASSISTANCE Description: STG Manage Bowel with Assistance. Outcome: Progressing   Problem: RH BLADDER ELIMINATION Goal: RH STG MANAGE BLADDER WITH ASSISTANCE Description: STG Manage Bladder With minimal  Assistance Outcome: Progressing   Problem: RH SKIN INTEGRITY Goal: RH STG SKIN FREE OF INFECTION/BREAKDOWN Description: Manage skin free of infection/breakdown with minimal assistance Outcome: Progressing   Problem: RH SAFETY Goal: RH STG ADHERE TO SAFETY PRECAUTIONS W/ASSISTANCE/DEVICE Description: STG Adhere to Safety Precautions With minimal assistance Assistance/Device. Outcome: Progressing   Problem: RH PAIN MANAGEMENT Goal: RH STG PAIN MANAGED AT OR BELOW PT'S PAIN GOAL Outcome: Progressing   Problem: RH KNOWLEDGE DEFICIT GENERAL Goal: RH STG INCREASE KNOWLEDGE OF SELF CARE AFTER HOSPITALIZATION Description: Manage increase knowledge of self care after hospitalization with minimal assistance from daughter using educational materials provided. Outcome: Progressing   Problem: Education: Goal: Ability to describe self-care measures that may prevent or decrease complications (Diabetes Survival Skills Education) will improve Outcome: Progressing Goal: Individualized Educational Video(s) Outcome: Progressing

## 2024-06-23 DIAGNOSIS — R739 Hyperglycemia, unspecified: Secondary | ICD-10-CM | POA: Diagnosis not present

## 2024-06-23 DIAGNOSIS — I10 Essential (primary) hypertension: Secondary | ICD-10-CM | POA: Diagnosis not present

## 2024-06-23 DIAGNOSIS — R5381 Other malaise: Secondary | ICD-10-CM | POA: Diagnosis not present

## 2024-06-23 DIAGNOSIS — K5901 Slow transit constipation: Secondary | ICD-10-CM | POA: Diagnosis not present

## 2024-06-23 LAB — GLUCOSE, CAPILLARY: Glucose-Capillary: 118 mg/dL — ABNORMAL HIGH (ref 70–99)

## 2024-06-23 NOTE — Progress Notes (Signed)
 PROGRESS NOTE   Subjective/Complaints:  Pt doing well again, slept well, denies pain currently, LBM yesterday, urinating fine. No other complaints or concerns.   ROS: as per HPI. Denies CP, SOB, abd pain, N/V/D/C, or any other complaints at this time.   +Rt hip pain -improved + Urinary retention-improving Denies dysuria  Objective:   No results found.  Recent Labs    06/21/24 0614  WBC 6.0  HGB 8.3*  HCT 25.1*  PLT 208    Recent Labs    06/21/24 0614  NA 137  K 4.1  CL 107  CO2 22  GLUCOSE 107*  BUN 28*  CREATININE 1.54*  CALCIUM  8.4*   No intake or output data in the 24 hours ending 06/23/24 1118       Physical Exam: Vital Signs Blood pressure (!) 142/60, pulse (!) 58, temperature 98.4 F (36.9 C), temperature source Oral, resp. rate 16, height 5' 7 (1.702 m), weight 98.8 kg, SpO2 98%.  General: No acute distress, sitting in chair HEENT: NCAT, EOMI, oral membranes moist Cards: Bradycardic but no m/r/g Chest: Clear to auscultation bilaterally, nonlabored breathing  abdomen: Soft, NT, ND, + BS normal active Skin: No breakdown noted on visible portion of skin Extremities: very trace b/l edema Psych: pleasant and appropriate  PRIOR EXAMS: MSK:      No apparent deformity.  No significant greater trochanter tenderness today       Neurologic exam: Cranial nerves II through XII grossly intact, alert and oriented x 4, fluent, sensation intact light touch in all 4 extremities       Strength: Bilateral upper extremities 4/5 proximally, 5/5 distally      Bilateral lower extremities 4/5 proximally, 4/5 distally   Assessment/Plan: 1. Functional deficits which require 3+ hours per day of interdisciplinary therapy in a comprehensive inpatient rehab setting. Physiatrist is providing close team supervision and 24 hour management of active medical problems listed below. Physiatrist and rehab team continue to  assess barriers to discharge/monitor patient progress toward functional and medical goals  Care Tool:  Bathing    Body parts bathed by patient: Right arm, Left arm, Chest, Abdomen, Right upper leg, Left upper leg, Right lower leg, Left lower leg, Face     Body parts n/a: Buttocks, Front perineal area, Right lower leg, Left lower leg   Bathing assist Assist Level: Contact Guard/Touching assist     Upper Body Dressing/Undressing Upper body dressing   What is the patient wearing?: Pull over shirt    Upper body assist Assist Level: Set up assist    Lower Body Dressing/Undressing Lower body dressing      What is the patient wearing?: Underwear/pull up, Pants     Lower body assist Assist for lower body dressing: Moderate Assistance - Patient 50 - 74%     Toileting Toileting    Toileting assist Assist for toileting: Maximal Assistance - Patient 25 - 49%     Transfers Chair/bed transfer  Transfers assist  Chair/bed transfer activity did not occur: Safety/medical concerns  Chair/bed transfer assist level: Contact Guard/Touching assist     Locomotion Ambulation   Ambulation assist      Assist level: Contact  Guard/Touching assist Assistive device: Walker-rolling Max distance: 156   Walk 10 feet activity   Assist     Assist level: Contact Guard/Touching assist Assistive device: Walker-rolling   Walk 50 feet activity   Assist Walk 50 feet with 2 turns activity did not occur: Safety/medical concerns  Assist level: Contact Guard/Touching assist Assistive device: Walker-rolling    Walk 150 feet activity   Assist Walk 150 feet activity did not occur: Safety/medical concerns (fatigue)  Assist level: Contact Guard/Touching assist Assistive device: Walker-rolling    Walk 10 feet on uneven surface  activity   Assist Walk 10 feet on uneven surfaces activity did not occur: Safety/medical concerns         Wheelchair     Assist Is the patient  using a wheelchair?: Yes Type of Wheelchair: Manual    Wheelchair assist level: Dependent - Patient 0%      Wheelchair 50 feet with 2 turns activity    Assist        Assist Level: Dependent - Patient 0%   Wheelchair 150 feet activity     Assist      Assist Level: Dependent - Patient 0%   Blood pressure (!) 142/60, pulse (!) 58, temperature 98.4 F (36.9 C), temperature source Oral, resp. rate 16, height 5' 7 (1.702 m), weight 98.8 kg, SpO2 98%.  Medical Problem List and Plan: 1. Functional deficits secondary to debility s/p UTI             -patient may shower             -ELOS/Goals: 21-24 days, Min A PT/OT             -Continue CIR therapies including PT, OT    - Expected discharge 8/30 - Discussed bladder function with daughter, plan for Urecholine .  Daughter also asked about her routine Prolia  injections that are due.  I confirmed with with pharmacy unable to get this inpatient, this will need to be outpatient.  2.  Antithrombotics: -DVT/anticoagulation:  Pharmaceutical: continue Lovenox  30mg  daily             -antiplatelet therapy: N/A  3. Pain Management: continue Tylenol  prn.  -8/13 patient like to avoid stronger pain medications.  Having some right hip pain after stairs.  Will try Tylenol  and heating pad for now. 8/14 hip pain doing a little better, continue current regimen for now, we will see how she does with therapy 8/15 discussed scheduling some Tylenol  before therapy, patient declines for now.  She reports overall controlled  8/18 will schedule Tylenol  in the morning, patient agreeable  8/19 Xray R hip- B/L hip OA, lidocaine  patch  8/21-22 pain overall under control, continue to monitor   4. Mood/Behavior/Sleep: LCSW to follow for evaluation and support.              -antipsychotic agents: N/A 5. Neuropsych/cognition: This patient is not fully capable of making decisions on her own behalf. 6. Skin/Wound Care: Routine pressure relief meausre.  7.  Fluids/Electrolytes/Nutrition: Monitor I/O. Check CMET in am.  8.HTN: Monitor BP TID- continue amlodipine  and hydralazine .  8/9 increase hydralazine  to 37.5mg  BID 8/10 reports patient had been advised to take amlodipine  at night, medication timing adjusted.  She was also taking nebivolol .  Will hold off on adding this today as we are changing amlodipine  timing.  If BP still elevated tomorrow consider adding low-dose Nebivolol  8/12 will restart Nebivolol  at 2.5 mg  8/13 increase Nebivolol  to 5mg  8/14 BP controlled  doing better overall, continue current regimen and monitor  8/17: decrease amlodipine  to 5mg  given diastolic hypotension  - 8/18 fair control continue current regimen and monitor for now - 8/19 systolic BP intermittently elevated, diastolic a little low.  Will increase amlodipine  back to 10 mg but decrease Nebivolol  to 2.5 mg -8/20 systolic blood pressure remains elevated , increase flomax , consider increase Nebivolol  back to 5 mg, monitor HR  8/21 increase Nebivolol  back to 5 mg  8/22 fair BP control, continue to monitor trend  -8/23-24/25 BP a little up still, but just changed meds again; monitor trend Vitals:   06/19/24 2009 06/19/24 2147 06/20/24 0618 06/20/24 1614  BP: (!) 159/55 (!) 154/62 (!) 158/62 (!) 144/49   06/20/24 1957 06/21/24 0619 06/21/24 1232 06/21/24 2058  BP: 132/66 (!) 158/53 (!) 151/55 (!) 155/57   06/22/24 0455 06/22/24 1329 06/22/24 2005 06/23/24 0425  BP: (!) 146/54 (!) 138/46 (!) 156/61 (!) 142/60    9. Anemia of chronic disease: Negative work up by Dr. Freddie and Dr Lanny last month.     - Appears to be gradually trending down on review on records. Check FOBT  -8/13 hemoglobin overall stable at 8.4, discussed FOBT nursing team  -8/14 hemoglobin remained stable at 8.4  -8/18 no FOBT completed yet but hemoglobin appears stable  -8/22 hemoglobin overall stable at 8.3, continue to monitor   10. T2DM: Diet controlled- Monitor BS ac/hs.  Change CBGs to  just daily  -06/22/24 still getting CBGs 4x/d, will alert nursing staff. CBGs <200  -06/23/24 CBG this morning great at 118. Just monitor daily, SSI stopped CBG (last 3)  Recent Labs    06/22/24 0601 06/22/24 1121 06/23/24 0535  GLUCAP 113* 178* 118*    11. H/o UTIs/Urinary retention: Question due to Merbetriq--d/c a couple of days ago?  Appears this.  For urinary retention.  Foley replaced. Will remove and start bladder program             -monitor voiding with PVR/bladder scan.   -Will ask nursing to get some PVRs 8/14 I see 1 bladder scan yesterday for  433 mL, unsure if this is before or after she voided.  Will asked nursing to document PVRs 8/15 bladder scan of 270, looks like she had to void a few minutes prior.  She is currently on Flomax .  Will asked nursing to check a few more.  If continues consider Urecholine  8/18 will add additional nursing order regarding PVR/bladder scans, do not see any additional ones completed -8/20 PVRs remain mildly elevated, increase flomax  to 0.8mg , she is declining IC -8/22 Urecholine  started yesterday, appears may be helping last PVR of 27, although prior to PVRs were in the low 200s.  Will increase Urecholine  to 10 mg 3 times daily -06/22/24 not many PVRs, but all fairly low, monitor  12. Dementia: Continue Donepezil .   13. Constipation: Had enema yesterday after no BM X one week - 8/10 lactulose  changed from daily to as needed, had a large bowel movement yesterday  8/11 last bm today. Pt/daughter satisfied with current regimen  8/13 LBM documented 6/11, will add prn Miralax  for mild constipation, encourage oral fluid intake  8/20 Miralax  34g , LBM 8/17  8/22 no BM yesterday continue to monitor  -06/23/24 LBM yesterday morning, cont regimen  14.  CKD 3b: Monitor with serial checks as well as UOP/voiding   - 8/9 creatinine 1.39, overall stable continue to monitor  8/11-a slight bump in BUN/Cr today (20/1.52).  I'm not too concerned at present.  WIll encourage PO and follow up lab work on Wednesday.  8/14 Cr little higher at 1.58/BUN 25, still around overall baseline, continue encourage oral fluids again today 8/15 discussed oral fluid intake, continue to monitor labs.  If increases further consider some short-term IV fluids  8/16 Cr reviewed and is mildly improved  8/18 creatinine a little higher, will start some gentle IVF 8/20 BUN 24, Cr down to 1.42, recheck Friday, DC IVF. Encourage oral fluids 8/22 creatinine and BUN a little higher today at 1.5/28, continue to encourage oral fluids  15. Obesity. Class 2. Dietary counseling   -Body mass index is 34.11 kg/m.     LOS: 16 days A FACE TO FACE EVALUATION WAS PERFORMED  [id number] 06/23/2024, 11:18 AM

## 2024-06-23 NOTE — Plan of Care (Signed)
  Problem: Consults Goal: RH GENERAL PATIENT EDUCATION Description: See Patient Education module for education specifics. Outcome: Progressing   Problem: RH BOWEL ELIMINATION Goal: RH STG MANAGE BOWEL WITH ASSISTANCE Description: STG Manage Bowel with Assistance. Outcome: Progressing   Problem: RH BLADDER ELIMINATION Goal: RH STG MANAGE BLADDER WITH ASSISTANCE Description: STG Manage Bladder With minimal  Assistance Outcome: Progressing   Problem: RH SKIN INTEGRITY Goal: RH STG SKIN FREE OF INFECTION/BREAKDOWN Description: Manage skin free of infection/breakdown with minimal assistance Outcome: Progressing   Problem: RH SAFETY Goal: RH STG ADHERE TO SAFETY PRECAUTIONS W/ASSISTANCE/DEVICE Description: STG Adhere to Safety Precautions With minimal assistance Assistance/Device. Outcome: Progressing

## 2024-06-23 NOTE — Plan of Care (Signed)
  Problem: Consults Goal: RH GENERAL PATIENT EDUCATION Description: See Patient Education module for education specifics. Outcome: Progressing   Problem: RH BLADDER ELIMINATION Goal: RH STG MANAGE BLADDER WITH ASSISTANCE Description: STG Manage Bladder With minimal  Assistance Outcome: Progressing   Problem: RH SKIN INTEGRITY Goal: RH STG SKIN FREE OF INFECTION/BREAKDOWN Description: Manage skin free of infection/breakdown with minimal assistance Outcome: Progressing   Problem: RH SAFETY Goal: RH STG ADHERE TO SAFETY PRECAUTIONS W/ASSISTANCE/DEVICE Description: STG Adhere to Safety Precautions With minimal assistance Assistance/Device. Outcome: Progressing   Problem: RH KNOWLEDGE DEFICIT GENERAL Goal: RH STG INCREASE KNOWLEDGE OF SELF CARE AFTER HOSPITALIZATION Description: Manage increase knowledge of self care after hospitalization with minimal assistance from daughter using educational materials provided. Outcome: Progressing   Problem: Education: Goal: Ability to describe self-care measures that may prevent or decrease complications (Diabetes Survival Skills Education) will improve Outcome: Progressing Goal: Individualized Educational Video(s) Outcome: Progressing

## 2024-06-24 ENCOUNTER — Ambulatory Visit: Payer: Medicare Other

## 2024-06-24 DIAGNOSIS — I10 Essential (primary) hypertension: Secondary | ICD-10-CM | POA: Diagnosis not present

## 2024-06-24 DIAGNOSIS — K5900 Constipation, unspecified: Secondary | ICD-10-CM | POA: Diagnosis not present

## 2024-06-24 DIAGNOSIS — R5381 Other malaise: Secondary | ICD-10-CM | POA: Diagnosis not present

## 2024-06-24 DIAGNOSIS — N189 Chronic kidney disease, unspecified: Secondary | ICD-10-CM | POA: Diagnosis not present

## 2024-06-24 LAB — URINALYSIS, W/ REFLEX TO CULTURE (INFECTION SUSPECTED)
Bilirubin Urine: NEGATIVE
Glucose, UA: NEGATIVE mg/dL
Hgb urine dipstick: NEGATIVE
Ketones, ur: NEGATIVE mg/dL
Nitrite: NEGATIVE
Protein, ur: >=300 mg/dL — AB
Specific Gravity, Urine: 1.011 (ref 1.005–1.030)
WBC, UA: >50 WBC/hpf (ref 0–5)
pH: 5 (ref 5.0–8.0)

## 2024-06-24 LAB — BASIC METABOLIC PANEL WITH GFR
Anion gap: 7 (ref 5–15)
BUN: 33 mg/dL — ABNORMAL HIGH (ref 8–23)
CO2: 21 mmol/L — ABNORMAL LOW (ref 22–32)
Calcium: 8.3 mg/dL — ABNORMAL LOW (ref 8.9–10.3)
Chloride: 109 mmol/L (ref 98–111)
Creatinine, Ser: 1.79 mg/dL — ABNORMAL HIGH (ref 0.44–1.00)
GFR, Estimated: 27 mL/min — ABNORMAL LOW (ref >60)
Glucose, Bld: 127 mg/dL — ABNORMAL HIGH (ref 70–99)
Potassium: 4.1 mmol/L (ref 3.5–5.1)
Sodium: 137 mmol/L (ref 135–145)

## 2024-06-24 LAB — CBC
HCT: 26.5 % — ABNORMAL LOW (ref 36.0–46.0)
Hemoglobin: 8.7 g/dL — ABNORMAL LOW (ref 12.0–15.0)
MCH: 29.4 pg (ref 26.0–34.0)
MCHC: 32.8 g/dL (ref 30.0–36.0)
MCV: 89.5 fL (ref 80.0–100.0)
Platelets: 190 K/uL (ref 150–400)
RBC: 2.96 MIL/uL — ABNORMAL LOW (ref 3.87–5.11)
RDW: 15.2 % (ref 11.5–15.5)
WBC: 6.1 K/uL (ref 4.0–10.5)
nRBC: 0 % (ref 0.0–0.2)

## 2024-06-24 LAB — GLUCOSE, CAPILLARY: Glucose-Capillary: 123 mg/dL — ABNORMAL HIGH (ref 70–99)

## 2024-06-24 MED ORDER — CEPHALEXIN 250 MG PO CAPS
250.0000 mg | ORAL_CAPSULE | Freq: Four times a day (QID) | ORAL | Status: DC
  Administered 2024-06-24 – 2024-06-26 (×8): 250 mg via ORAL
  Filled 2024-06-24 (×8): qty 1

## 2024-06-24 MED ORDER — HYDRALAZINE HCL 25 MG PO TABS
37.5000 mg | ORAL_TABLET | Freq: Three times a day (TID) | ORAL | Status: DC
  Administered 2024-06-24 – 2024-06-26 (×7): 37.5 mg via ORAL
  Filled 2024-06-24 (×7): qty 2

## 2024-06-24 MED ORDER — SODIUM CHLORIDE 0.9 % IV SOLN: INTRAVENOUS | Status: DC

## 2024-06-24 NOTE — Progress Notes (Signed)
 Physical Therapy Session Note  Patient Details  Name: [name] Mathews MRN: [id number] Date of Birth: 03/26/1934  Today's Date: 06/24/2024 PT Individual Time: 1115-1200 PT Individual Time Calculation (min): 45 min   Short Term Goals: Week 3:  PT Short Term Goal 1 (Week 3): STG=LTG 2/2 ELOS  Skilled Therapeutic Interventions/Progress Updates:  Pt received upright in recliner, asleep. Pt awoken and slow to agree to therapy. Suspected cognitive decline noted in patient as she required increased time to answer questions and struggled with word finding in several answers. Pt appeared to ignore one question, but it did not seem intentional. SPT noted cognitive decline in pt vs previous sessions.  Pt encouraged to perform seated LAQ's, seated marching, hip flx w/ knee extension but pt noted to perform 1-2 reps but required max cueing to continue exercises w/ fair to poor carryover. Pt encouraged to coordinate contralateral UE w/ LE hip flexion but pt unable synchronize steps. Pt did complete several LAQs, seated marches, resisted hip abd/add, and resisted knee flexion.  Pt remained seated throughout session, but performed seated exercises to improve BLE strength and coordination. Pt given call bell, hospital phone, and all other needs in reach.   Therapy Documentation Precautions:  Precautions Precautions: Fall Recall of Precautions/Restrictions: Intact Restrictions Weight Bearing Restrictions Per Provider Order: No   Therapy/Group: Individual Therapy  [name] 06/24/2024, 12:42 PM

## 2024-06-24 NOTE — Progress Notes (Addendum)
 PROGRESS NOTE   Subjective/Complaints:  She reports she sleeps well because she had In-N-Out cath completed at midnight.  Therapy feels like she is a little more confused today than prior days.  ROS: as per HPI. Denies CP, SOB, abd pain, N/V/D/C, or any other complaints at this time.   +Rt hip pain -improved + Urinary retention-ongoing Denies dysuria  Objective:   No results found.  Recent Labs    06/24/24 0504  WBC 6.1  HGB 8.7*  HCT 26.5*  PLT 190    Recent Labs    06/24/24 0504  NA 137  K 4.1  CL 109  CO2 21*  GLUCOSE 127*  BUN 33*  CREATININE 1.79*  CALCIUM  8.3*    Intake/Output Summary (Last 24 hours) at 06/24/2024 1329 Last data filed at 06/24/2024 1324 Gross per 24 hour  Intake 960 ml  Output 932 ml  Net 28 ml         Physical Exam: Vital Signs Blood pressure (!) 159/52, pulse (!) 57, temperature 98.6 F (37 C), temperature source Oral, resp. rate 18, height 5' 7 (1.702 m), weight 98.8 kg, SpO2 99%.  General: No acute distress, sitting in chair, working with physical therapy in her room HEENT: NCAT, EOMI, oral membranes moist Cards: Bradycardic but no m/r/g Chest: Clear to auscultation bilaterally, nonlabored breathing  abdomen: Soft, NT, ND, + BS normal active Skin: No breakdown noted on visible portion of skin Extremities: very trace b/l edema Psych: pleasant and appropriate  MSK:      No apparent deformity.  No significant greater trochanter tenderness today       Neurologic exam: Alert awake, cranial nerves II through XII grossly intact, fluent, sensation intact light touch in all 4 extremities       Strength: Bilateral upper extremities 4/5 proximally, 5/5 distally      Bilateral lower extremities 4/5 proximally, 4/5 distally   Assessment/Plan: 1. Functional deficits which require 3+ hours per day of interdisciplinary therapy in a comprehensive inpatient rehab  setting. Physiatrist is providing close team supervision and 24 hour management of active medical problems listed below. Physiatrist and rehab team continue to assess barriers to discharge/monitor patient progress toward functional and medical goals  Care Tool:  Bathing    Body parts bathed by patient: Right arm, Left arm, Chest, Abdomen, Right upper leg, Left upper leg, Right lower leg, Left lower leg, Face     Body parts n/a: Buttocks, Front perineal area, Right lower leg, Left lower leg   Bathing assist Assist Level: Contact Guard/Touching assist     Upper Body Dressing/Undressing Upper body dressing   What is the patient wearing?: Pull over shirt    Upper body assist Assist Level: Set up assist    Lower Body Dressing/Undressing Lower body dressing      What is the patient wearing?: Underwear/pull up, Pants     Lower body assist Assist for lower body dressing: Moderate Assistance - Patient 50 - 74%     Toileting Toileting    Toileting assist Assist for toileting: Maximal Assistance - Patient 25 - 49%     Transfers Chair/bed transfer  Transfers assist  Chair/bed transfer activity  did not occur: Safety/medical concerns  Chair/bed transfer assist level: Contact Guard/Touching assist     Locomotion Ambulation   Ambulation assist      Assist level: Contact Guard/Touching assist Assistive device: Walker-rolling Max distance: 156   Walk 10 feet activity   Assist     Assist level: Contact Guard/Touching assist Assistive device: Walker-rolling   Walk 50 feet activity   Assist Walk 50 feet with 2 turns activity did not occur: Safety/medical concerns  Assist level: Contact Guard/Touching assist Assistive device: Walker-rolling    Walk 150 feet activity   Assist Walk 150 feet activity did not occur: Safety/medical concerns (fatigue)  Assist level: Contact Guard/Touching assist Assistive device: Walker-rolling    Walk 10 feet on uneven  surface  activity   Assist Walk 10 feet on uneven surfaces activity did not occur: Safety/medical concerns         Wheelchair     Assist Is the patient using a wheelchair?: Yes Type of Wheelchair: Manual    Wheelchair assist level: Dependent - Patient 0%      Wheelchair 50 feet with 2 turns activity    Assist        Assist Level: Dependent - Patient 0%   Wheelchair 150 feet activity     Assist      Assist Level: Dependent - Patient 0%   Blood pressure (!) 159/52, pulse (!) 57, temperature 98.6 F (37 C), temperature source Oral, resp. rate 18, height 5' 7 (1.702 m), weight 98.8 kg, SpO2 99%.  Medical Problem List and Plan: 1. Functional deficits secondary to debility s/p UTI             -patient may shower             -ELOS/Goals: 21-24 days, Min A PT/OT             -Continue CIR therapies including PT, OT    - Expected discharge 8/30  2.  Antithrombotics: -DVT/anticoagulation:  Pharmaceutical: continue Lovenox  30mg  daily             -antiplatelet therapy: N/A  3. Pain Management: continue Tylenol  prn.  -8/13 patient like to avoid stronger pain medications.  Having some right hip pain after stairs.  Will try Tylenol  and heating pad for now. 8/14 hip pain doing a little better, continue current regimen for now, we will see how she does with therapy 8/15 discussed scheduling some Tylenol  before therapy, patient declines for now.  She reports overall controlled  8/18 will schedule Tylenol  in the morning, patient agreeable  8/19 Xray R hip- B/L hip OA, lidocaine  patch  8/21-22 pain overall under control, continue to monitor  8/25 patient reports that her pain remains improved, continue to monitor   4. Mood/Behavior/Sleep: LCSW to follow for evaluation and support.              -antipsychotic agents: N/A 5. Neuropsych/cognition: This patient is not fully capable of making decisions on her own behalf. 6. Skin/Wound Care: Routine pressure relief  meausre.  7. Fluids/Electrolytes/Nutrition: Monitor I/O. Check CMET in am.  8.HTN: Monitor BP TID- continue amlodipine  and hydralazine .  8/9 increase hydralazine  to 37.5mg  BID 8/10 reports patient had been advised to take amlodipine  at night, medication timing adjusted.  She was also taking nebivolol .  Will hold off on adding this today as we are changing amlodipine  timing.  If BP still elevated tomorrow consider adding low-dose Nebivolol  8/12 will restart Nebivolol  at 2.5 mg  8/13 increase Nebivolol   to 5mg  8/14 BP controlled doing better overall, continue current regimen and monitor  8/17: decrease amlodipine  to 5mg  given diastolic hypotension  - 8/18 fair control continue current regimen and monitor for now - 8/19 systolic BP intermittently elevated, diastolic a little low.  Will increase amlodipine  back to 10 mg but decrease Nebivolol  to 2.5 mg -8/20 systolic blood pressure remains elevated , increase flomax , consider increase Nebivolol  back to 5 mg, monitor HR  8/21 increase Nebivolol  back to 5 mg  8/22 fair BP control, continue to monitor trend  -8/25 increase hydralazine  to every 8 hours Vitals:   06/20/24 1957 06/21/24 0619 06/21/24 1232 06/21/24 2058  BP: 132/66 (!) 158/53 (!) 151/55 (!) 155/57   06/22/24 0455 06/22/24 1329 06/22/24 2005 06/23/24 0425  BP: (!) 146/54 (!) 138/46 (!) 156/61 (!) 142/60   06/23/24 1641 06/23/24 1950 06/24/24 0426 06/24/24 1251  BP: (!) 169/59 (!) 164/61 (!) 143/57 (!) 159/52    9. Anemia of chronic disease: Negative work up by Dr. Freddie and Dr Lanny last month.     - Appears to be gradually trending down on review on records. Check FOBT  -8/13 hemoglobin overall stable at 8.4, discussed FOBT nursing team  -8/14 hemoglobin remained stable at 8.4  -8/18 no FOBT completed yet but hemoglobin appears stable  -8/25 hemoglobin stable at 8.7   10. T2DM: Diet controlled- Monitor BS ac/hs.  Change CBGs to just daily  -06/22/24 still getting CBGs 4x/d,  will alert nursing staff. CBGs <200  -06/23/24 CBG this morning great at 118. Just monitor daily, SSI stopped  8/25 CBG stable CBG (last 3)  Recent Labs    06/22/24 1121 06/23/24 0535 06/24/24 0535  GLUCAP 178* 118* 123*    11. H/o UTIs/Urinary retention: Question due to Merbetriq--d/c a couple of days ago?  Appears this.  For urinary retention.  Foley replaced. Will remove and start bladder program             -monitor voiding with PVR/bladder scan.   -Will ask nursing to get some PVRs 8/14 I see 1 bladder scan yesterday for  433 mL, unsure if this is before or after she voided.  Will asked nursing to document PVRs 8/15 bladder scan of 270, looks like she had to void a few minutes prior.  She is currently on Flomax .  Will asked nursing to check a few more.  If continues consider Urecholine  8/18 will add additional nursing order regarding PVR/bladder scans, do not see any additional ones completed -8/20 PVRs remain mildly elevated, increase flomax  to 0.8mg , she is declining IC -8/22 Urecholine  started yesterday, appears may be helping last PVR of 27, although prior to PVRs were in the low 200s.  Will increase Urecholine  to 10 mg 3 times daily -06/22/24 not many PVRs, but all fairly low, monitor -8/25 patient had IC last night, potentially increased confusion this morning although may be due to poor sleep last night.  Will check UA- indicates UTI, start keflex   12. Dementia: Continue Donepezil .   13. Constipation: Had enema yesterday after no BM X one week - 8/10 lactulose  changed from daily to as needed, had a large bowel movement yesterday  8/11 last bm today. Pt/daughter satisfied with current regimen  8/13 LBM documented 6/11, will add prn Miralax  for mild constipation, encourage oral fluid intake  8/20 Miralax  34g , LBM 8/17  8/22 no BM yesterday continue to monitor  -06/23/24 LBM yesterday morning, cont regimen  14.  CKD 3b: Monitor  with serial checks as well as UOP/voiding   -  8/9 creatinine 1.39, overall stable continue to monitor  8/11-a slight bump in BUN/Cr today (20/1.52).  I'm not too concerned at present. WIll encourage PO and follow up lab work on Wednesday.  8/14 Cr little higher at 1.58/BUN 25, still around overall baseline, continue encourage oral fluids again today 8/15 discussed oral fluid intake, continue to monitor labs.  If increases further consider some short-term IV fluids  8/16 Cr reviewed and is mildly improved  8/18 creatinine a little higher, will start some gentle IVF 8/20 BUN 24, Cr down to 1.42, recheck Friday, DC IVF. Encourage oral fluids 8/22 creatinine and BUN a little higher today at 1.5/28, continue to encourage oral fluids 8/25 IV fluids 75 mL per hour as creatinine up to 1.79/BUN 33.  Appears she continues to have poor p.o. intake  15. Obesity. Class 2. Dietary counseling   -Body mass index is 34.11 kg/m.     LOS: 17 days A FACE TO FACE EVALUATION WAS PERFORMED  Murray Collier 06/24/2024, 1:04 PM

## 2024-06-24 NOTE — Progress Notes (Signed)
 Physical Therapy Weekly Progress Note  Patient Details  Name: [name] Mathews MRN: [id number] Date of Birth: [birth year]  Beginning of progress report period: June 17, 2024 End of progress report period: June 24, 2024  Today's Date: 06/24/2024 PT Individual Time: 8599-8557, 9052-8955  PT Individual Time Calculation (min): 42 min, 57 min  Patient has met 2 of 3 short term goals.  Pt is performing bed mobility with supervision/mod I, sit to stands, stand pivot transfer, and gait with Rollator x 150 feet with Rollator and close supervision/min A. Stair navigation up to 12 6 inch steps with CGA B handrails.  Pt mobility fluctuates with fatigue/increased confusion 2/2 current UTI. D/C scheduled for 8/30. Family training scheduled 8/27 with pt daughter.  Patient continues to demonstrate the following deficits muscle weakness and muscle joint tightness, decreased cardiorespiratoy endurance, decreased memory, and decreased standing balance and decreased balance strategies and therefore will continue to benefit from skilled PT intervention to increase functional independence with mobility.  Patient progressing toward long term goals..  Continue plan of care.  PT Short Term Goals Week 2:  PT Short Term Goal 1 (Week 2): pt will navigate 17 6 inch steps with B HR and min A PT Short Term Goal 1 - Progress (Week 2): Partly met PT Short Term Goal 2 (Week 2): pt perform sit to stand with LRAD and CGA consistently PT Short Term Goal 2 - Progress (Week 2): Met PT Short Term Goal 3 (Week 2): pt will ambulate 150 feet with LRAD and CGA PT Short Term Goal 3 - Progress (Week 2): Met Week 3:  PT Short Term Goal 1 (Week 3): STG=LTG 2/2 ELOS  Skilled Therapeutic Interventions/Progress Updates:      Treatment Session 1  Pt seated in recliner upon arrival. Pt agreeable to therapy. Pt denies any pain. Pt reports fatigue post in and out cath early this AM. Pt perseverating on in and out cath throughout session. Pt  denies need to use bathroom at start of session. PT encouraging fluids throughout session, pt continent of bladder at end of session.  MD present for AM rounds to address pt medical concerns.   Of note: pt appears more confused today in comparison to previous sessions and requiring increased time for processing, notified team via secure chat.   Pt required frequent redirection and encouragement for participation in therapy today.   Vitals assessed: seated in recliner: BP 163/48 HR 55, pt asymptomatic.   Pt performed sit to stand with close supervision. Pt ambulated 1x 150, 1x130  feet with Rollator and CGA/close supervision.   Pt performed ambulatory transfer to nu step with rollator and supervision, verbal cues provided for technique/sequencing for locking brakes of rollator.   Pt completed 10 min on nu step atlevel 3 for 8 min, and level 4 for 2 min for total of 274 steps.   Pt seated in recliner at end of session with all needs within reach and chair alarm on.   Treatment Session 2   Pt supine in bed upon arrival. Pt awoken and agreeable to therapy with encouragement from PT. Pt denies any pain.   Pt daughter present at start of session. Pt daughter noticed rollator in room and asking what it is. Education provided regarding what a rollator is, purpose, and why PT is recommending it for pt. PT initially under impression pt has rollator at home. Pt daughter confirmed pt does not have one and requesting we order her one here if possible. PT notified  Child psychotherapist. Scheduled family training for 8/27 at 10 am with pt daughter.   Treatment session held outside to encourage pt participation, improve activity tolerance 2/2 increased fatigue today, and to improve overall pt well being/quality of life. Pt very appreciative of being outside.   Education provided regarding methods to reduce fall risk at home including use of rollator/RW, not getting up without someone present, taking up all throw  rugs.   Pt supine in bed at end of session with all needs within reach and bed alarm on.       Therapy Documentation Precautions:  Precautions Precautions: Fall Recall of Precautions/Restrictions: Intact Restrictions Weight Bearing Restrictions Per Provider Order: Yes  Therapy/Group: Individual Therapy  [facility] [name], [place], DPT  06/24/2024, 7:47 AM

## 2024-06-24 NOTE — Plan of Care (Signed)
  Problem: Consults Goal: RH GENERAL PATIENT EDUCATION Description: See Patient Education module for education specifics. Outcome: Progressing   Problem: RH BOWEL ELIMINATION Goal: RH STG MANAGE BOWEL WITH ASSISTANCE Description: STG Manage Bowel with Assistance. Outcome: Progressing   Problem: RH BLADDER ELIMINATION Goal: RH STG MANAGE BLADDER WITH ASSISTANCE Description: STG Manage Bladder With minimal  Assistance Outcome: Progressing   Problem: RH SKIN INTEGRITY Goal: RH STG SKIN FREE OF INFECTION/BREAKDOWN Description: Manage skin free of infection/breakdown with minimal assistance Outcome: Progressing   Problem: RH SAFETY Goal: RH STG ADHERE TO SAFETY PRECAUTIONS W/ASSISTANCE/DEVICE Description: STG Adhere to Safety Precautions With minimal assistance Assistance/Device. Outcome: Progressing   Problem: RH PAIN MANAGEMENT Goal: RH STG PAIN MANAGED AT OR BELOW PT'S PAIN GOAL Outcome: Progressing   Problem: RH KNOWLEDGE DEFICIT GENERAL Goal: RH STG INCREASE KNOWLEDGE OF SELF CARE AFTER HOSPITALIZATION Description: Manage increase knowledge of self care after hospitalization with minimal assistance from daughter using educational materials provided. Outcome: Progressing   Problem: Education: Goal: Ability to describe self-care measures that may prevent or decrease complications (Diabetes Survival Skills Education) will improve Outcome: Progressing Goal: Individualized Educational Video(s) Outcome: Progressing   Problem: Coping: Goal: Ability to adjust to condition or change in health will improve Outcome: Progressing   Problem: Fluid Volume: Goal: Ability to maintain a balanced intake and output will improve Outcome: Progressing   Problem: Health Behavior/Discharge Planning: Goal: Ability to identify and utilize available resources and services will improve Outcome: Progressing Goal: Ability to manage health-related needs will improve Outcome: Progressing    Problem: Metabolic: Goal: Ability to maintain appropriate glucose levels will improve Outcome: Progressing   Problem: Nutritional: Goal: Maintenance of adequate nutrition will improve Outcome: Progressing Goal: Progress toward achieving an optimal weight will improve Outcome: Progressing   Problem: Skin Integrity: Goal: Risk for impaired skin integrity will decrease Outcome: Progressing   Problem: Tissue Perfusion: Goal: Adequacy of tissue perfusion will improve Outcome: Progressing

## 2024-06-24 NOTE — Plan of Care (Signed)
  Problem: Consults Goal: RH GENERAL PATIENT EDUCATION Description: See Patient Education module for education specifics. Outcome: Progressing   Problem: RH BOWEL ELIMINATION Goal: RH STG MANAGE BOWEL WITH ASSISTANCE Description: STG Manage Bowel with Assistance. Outcome: Progressing   Problem: RH SKIN INTEGRITY Goal: RH STG SKIN FREE OF INFECTION/BREAKDOWN Description: Manage skin free of infection/breakdown with minimal assistance Outcome: Progressing   Problem: RH SAFETY Goal: RH STG ADHERE TO SAFETY PRECAUTIONS W/ASSISTANCE/DEVICE Description: STG Adhere to Safety Precautions With minimal assistance Assistance/Device. Outcome: Progressing

## 2024-06-24 NOTE — Progress Notes (Signed)
 Patient ID: [name] Mathews, female   DOB: Feb 11, 1934, 89 y.o.   MRN: [id number]  Daughter plans to come in Wed 8/27 from 10-12 for family education.

## 2024-06-24 NOTE — Progress Notes (Signed)
 PT reports patient was showing signs of confusing during session. However, patient reassessed no increase in weakness, following commands, answer all questions appropriately. Daughter at bedside. MD aware. New orders placed.    Geni Armor, LPN

## 2024-06-24 NOTE — Progress Notes (Signed)
 Urine specimen collected and sent to lab per MD order.    Tilden Dome, LPN

## 2024-06-24 NOTE — Progress Notes (Signed)
 Occupational Therapy Session Note  Patient Details  Name: [name] Mathews MRN: [id number] Date of Birth: 05-19-1934   Today's Date: 06/24/2024 OT Individual Time: 801-857 56 minutes      Short Term Goals: Week 3:  OT Short Term Goal 1 (Week 3): STG=LTG d/t ELOS    Skilled Therapeutic Interventions/Progress Updates:   Patient agreeable to participate in OT session. Reports no pain level.   Patient participated in skilled OT session focusing on ADL retraining with focus on energy conservation and pacing for activity tolerance progression. Patient greeted while already up in recliner eating breakfast and clothing set up on window sill. Updated white board for date and weekly goals. Patient reports her daughter will obtain a reacher for increased ease for LB dressing and safe item retrieval. Min A to open difficult containers on tray due to hand weakness and decreased coordination. Use of rollator to transport clothing to sink side and then items back to recliner at end of session with close S for short in room ambulation. Intermittent seated and standing during self care routine. Increased time and effort for all tasks but able to sequence without cues. Reacher with increased time for LB garment reach to feet and min A for bra fasteners. Prefers slipper socks however able to demo use of [facility] (patient reports having at home) for slide on tennis shoe donning. Brief rest required post ADL routine with performance of 10 reps x 3 sets triceps press with yellow tband with demo and rest in between sets and min cues. All needs, chair alarm and nurse call button left in reach at end of session.    Therapy Documentation Precautions:  Precautions Precautions: Fall Recall of Precautions/Restrictions: Intact Restrictions Weight Bearing Restrictions Per Provider Order: No  Therapy/Group: Individual Therapy  Geni CHRISTELLA Andreas 06/24/2024, 12:18 PM

## 2024-06-25 DIAGNOSIS — I10 Essential (primary) hypertension: Secondary | ICD-10-CM | POA: Diagnosis not present

## 2024-06-25 DIAGNOSIS — R5381 Other malaise: Secondary | ICD-10-CM | POA: Diagnosis not present

## 2024-06-25 DIAGNOSIS — K5900 Constipation, unspecified: Secondary | ICD-10-CM | POA: Diagnosis not present

## 2024-06-25 DIAGNOSIS — R339 Retention of urine, unspecified: Secondary | ICD-10-CM | POA: Diagnosis not present

## 2024-06-25 NOTE — Progress Notes (Signed)
 Physical Therapy Session Note  Patient Details  Name: [name] Mathews MRN: [id number] Date of Birth: 1934/08/01  Today's Date: 06/25/2024 PT Individual Time: 0820-0915, 8547-8463 PT Individual Time Calculation (min): 55 min, 44 min  Short Term Goals: Week 3:  PT Short Term Goal 1 (Week 3): STG=LTG 2/2 ELOS  Skilled Therapeutic Interventions/Progress Updates:      Treatment Session 1  Pt eating breakfast in bed upon arrival. Pt agreeable to therapy. Pt denies any pain. Pt missed 20 min 2/2 eating breakfast.   Pt performed supine to sit with mod I. Pt required increased time but donned the following items while seated EOB: bra with set up assist, shirt with set up assist, fed brief and pants through B LE with use of figure 4 technique and min A. Performed sit to stand with min A progressing to CGA, required total A for donning over buttocks 2/2 urgency with need to use bathroom. Pt ambulated to bathroom with rollator and CGA, with max verbal cues provided for safety with rollator 2/2 urgency. Pt required increased time on toilet. Pt continent of bladder and bowel. Pt performed front pericare while standing with rollator and CGA, pt required total A for back pericare while standing with rollator and CGA. Pt donned pants over buttocks with min A (to unfold pants in the back)  while standing with rollator and CGA. Pt demos increased trunk flexion today, max verbal cues provided for upright posture. Pt seated on rollator and sink and washed face, brushed teeth and donned deodarant with set up assist. Verbal cues provided for technique/sequencing with seated rest break on rollator. Pt stood from rollator with min A, but having difficulty coordinating turn to face front of rollator--attempted multiple trails but discontinued for safety. Pt stood from rollator with use of RW and min A for posterior bias. Pt ambulated sink to recliner with use of RW and min A, max verbal cues provided for safety with RW and  upright posture.   Pt overall very fatigued with getting dressed and frequently mouth breathing. Verbal cues provided for pursed lip breathing for energy conservation.   Pt overall more confused today, requiring max instructional cues for upright posture and safety with AD. Notified nurse.   Pt seated in recliner with all needs within reach at end of session and chair alarm on.   Treatment Session 2   Pt supine in bed upon arrival. Pt agreeable to therapy with encouragement from PT. Pt denies any pain. Pt daughter present in room. Pt daugther asking if pt needs a WC. Recommended transport chair for pt for time/energy conservation as needed for community. Pt daughter verbalized understanding.   Treatment session focused on improved independence with seated rest breaks on rollator, and recognizing signs and symptoms of fatigue/initiation of rest breaks on rollator.   Pt ambulated room <> ortho gym <> room with multiple seated rest breaks on rollator with min A progressing to supervision. Verbal cues provided for posiitoning rollator against wall for improved stability, verbal cues provided for LE positioning with emphasis on keeping B LE within frame of rollator when navigating turn.   Education provided for taking seated rest break as needed for fatigue with emphasis on not waiting until tank is empty to initiate rest break.   Pt continent of bowel and bladder. Pt donned/doffed pants with CGA. Pt seated EOB with pt daughter at end of session.   Pt overall demonstrates less confusion this afternoon and improved recall and carry over throughout session  with supervision.      Therapy Documentation Precautions:  Precautions Precautions: Fall Recall of Precautions/Restrictions: Intact Restrictions Weight Bearing Restrictions Per Provider Order: No   Therapy/Group: Individual Therapy  [facility] [place], [place], DPT  06/25/2024, 8:04 AM

## 2024-06-25 NOTE — Progress Notes (Signed)
 PROGRESS NOTE   Subjective/Complaints:  No new concerns/complaints this AM. LBM today. Hip pain improved. PVR 0 today.   ROS: as per HPI. Denies CP, SOB, abd pain, N/V/D/C, or any other complaints at this time.   +Rt hip pain -improved + Urinary retention-improved today  Objective:   No results found.  Recent Labs    06/24/24 0504  WBC 6.1  HGB 8.7*  HCT 26.5*  PLT 190    Recent Labs    06/24/24 0504  NA 137  K 4.1  CL 109  CO2 21*  GLUCOSE 127*  BUN 33*  CREATININE 1.79*  CALCIUM  8.3*    Intake/Output Summary (Last 24 hours) at 06/25/2024 1439 Last data filed at 06/25/2024 1330 Gross per 24 hour  Intake 600 ml  Output --  Net 600 ml         Physical Exam: Vital Signs Blood pressure (!) 158/53, pulse 62, temperature 98.5 F (36.9 C), temperature source Oral, resp. rate 16, height 5' 7 (1.702 m), weight 98.8 kg, SpO2 100%.  General: No acute distress, sitting in bedside chair  HEENT: NCAT, EOMI, oral membranes moist Cards: Bradycardic but no m/r/g Chest: Clear to auscultation bilaterally, nonlabored breathing  abdomen: Soft, NT, ND, + BS normal active Skin: No breakdown noted on visible portion of skin Extremities:  trace b/l edema Psych: pleasant and appropriate  MSK:      No apparent deformity.  No significant greater trochanter tenderness today       Neurologic exam: Alert awake, cranial nerves II through XII grossly intact, fluent, sensation intact light touch in all 4 extremities       Strength: Bilateral upper extremities 4/5 proximally, 5/5 distally      Bilateral lower extremities 4/5 proximally, 4/5 distally  Prior neuro assessment is c/w today's exam 06/25/2024.   Assessment/Plan: 1. Functional deficits which require 3+ hours per day of interdisciplinary therapy in a comprehensive inpatient rehab setting. Physiatrist is providing close team supervision and 24 hour management of  active medical problems listed below. Physiatrist and rehab team continue to assess barriers to discharge/monitor patient progress toward functional and medical goals  Care Tool:  Bathing    Body parts bathed by patient: Right arm, Left arm, Chest, Abdomen, Right upper leg, Left upper leg, Right lower leg, Left lower leg, Face     Body parts n/a: Buttocks, Front perineal area, Right lower leg, Left lower leg   Bathing assist Assist Level: Contact Guard/Touching assist     Upper Body Dressing/Undressing Upper body dressing   What is the patient wearing?: Pull over shirt    Upper body assist Assist Level: Set up assist    Lower Body Dressing/Undressing Lower body dressing      What is the patient wearing?: Underwear/pull up, Pants     Lower body assist Assist for lower body dressing: Moderate Assistance - Patient 50 - 74%     Toileting Toileting    Toileting assist Assist for toileting: Maximal Assistance - Patient 25 - 49%     Transfers Chair/bed transfer  Transfers assist  Chair/bed transfer activity did not occur: Safety/medical concerns  Chair/bed transfer assist level: Contact Guard/Touching  assist     Locomotion Ambulation   Ambulation assist      Assist level: Contact Guard/Touching assist Assistive device: Walker-rolling Max distance: 156   Walk 10 feet activity   Assist     Assist level: Contact Guard/Touching assist Assistive device: Walker-rolling   Walk 50 feet activity   Assist Walk 50 feet with 2 turns activity did not occur: Safety/medical concerns  Assist level: Contact Guard/Touching assist Assistive device: Walker-rolling    Walk 150 feet activity   Assist Walk 150 feet activity did not occur: Safety/medical concerns (fatigue)  Assist level: Contact Guard/Touching assist Assistive device: Walker-rolling    Walk 10 feet on uneven surface  activity   Assist Walk 10 feet on uneven surfaces activity did not occur:  Safety/medical concerns   Assist level: Minimal Assistance - Patient > 75% Assistive device: Walker-rolling   Wheelchair     Assist Is the patient using a wheelchair?: No Type of Wheelchair: Manual    Wheelchair assist level: Dependent - Patient 0%      Wheelchair 50 feet with 2 turns activity    Assist        Assist Level: Dependent - Patient 0%   Wheelchair 150 feet activity     Assist      Assist Level: Dependent - Patient 0%   Blood pressure (!) 158/53, pulse 62, temperature 98.5 F (36.9 C), temperature source Oral, resp. rate 16, height 5' 7 (1.702 m), weight 98.8 kg, SpO2 100%.  Medical Problem List and Plan: 1. Functional deficits secondary to debility s/p UTI             -patient may shower             -ELOS/Goals: 21-24 days, Min A PT/OT             -Continue CIR therapies including PT, OT    - Expected discharge 8/30  -Team conference tomorrow  2.  Antithrombotics: -DVT/anticoagulation:  Pharmaceutical: continue Lovenox  30mg  daily             -antiplatelet therapy: N/A  3. Pain Management: continue Tylenol  prn.  -8/13 patient like to avoid stronger pain medications.  Having some right hip pain after stairs.  Will try Tylenol  and heating pad for now. 8/14 hip pain doing a little better, continue current regimen for now, we will see how she does with therapy 8/15 discussed scheduling some Tylenol  before therapy, patient declines for now.  She reports overall controlled  8/18 will schedule Tylenol  in the morning, patient agreeable  8/19 Xray R hip- B/L hip OA, lidocaine  patch  8/21-22 pain overall under control, continue to monitor  8/26 Pain reported to be controlled   4. Mood/Behavior/Sleep: LCSW to follow for evaluation and support.              -antipsychotic agents: N/A 5. Neuropsych/cognition: This patient is not fully capable of making decisions on her own behalf. 6. Skin/Wound Care: Routine pressure relief meausre.  7.  Fluids/Electrolytes/Nutrition: Monitor I/O. Check CMET in am.  8.HTN: Monitor BP TID- continue amlodipine  and hydralazine .  8/9 increase hydralazine  to 37.5mg  BID 8/10 reports patient had been advised to take amlodipine  at night, medication timing adjusted.  She was also taking nebivolol .  Will hold off on adding this today as we are changing amlodipine  timing.  If BP still elevated tomorrow consider adding low-dose Nebivolol  8/12 will restart Nebivolol  at 2.5 mg  8/13 increase Nebivolol  to 5mg  8/14 BP controlled doing better  overall, continue current regimen and monitor  8/17: decrease amlodipine  to 5mg  given diastolic hypotension  - 8/18 fair control continue current regimen and monitor for now - 8/19 systolic BP intermittently elevated, diastolic a little low.  Will increase amlodipine  back to 10 mg but decrease Nebivolol  to 2.5 mg -8/20 systolic blood pressure remains elevated , increase flomax , consider increase Nebivolol  back to 5 mg, monitor HR  8/21 increase Nebivolol  back to 5 mg  8/22 fair BP control, continue to monitor trend  -8/25 increase hydralazine  to every 8 hours  -8/26 Fair control, continue current regimen  Vitals:   06/21/24 1232 06/21/24 2058 06/22/24 0455 06/22/24 1329  BP: (!) 151/55 (!) 155/57 (!) 146/54 (!) 138/46   06/22/24 2005 06/23/24 0425 06/23/24 1641 06/23/24 1950  BP: (!) 156/61 (!) 142/60 (!) 169/59 (!) 164/61   06/24/24 0426 06/24/24 1251 06/24/24 1959 06/25/24 0558  BP: (!) 143/57 (!) 159/52 (!) 143/54 (!) 158/53    9. Anemia of chronic disease: Negative work up by Dr. Freddie and Dr Lanny last month.     - Appears to be gradually trending down on review on records. Check FOBT  -8/13 hemoglobin overall stable at 8.4, discussed FOBT nursing team  -8/14 hemoglobin remained stable at 8.4  -8/18 no FOBT completed yet but hemoglobin appears stable  -8/25 hemoglobin stable at 8.7  -8/26 recheck tomorrow   10. T2DM: Diet controlled- Monitor BS  ac/hs.  Change CBGs to just daily  -06/22/24 still getting CBGs 4x/d, will alert nursing staff. CBGs <200  -06/23/24 CBG this morning great at 118. Just monitor daily, SSI stopped  8/25 CBG stable  Recheck on BMP tomorrow   CBG (last 3)  Recent Labs    06/23/24 0535 06/24/24 0535  GLUCAP 118* 123*    11. H/o UTIs/Urinary retention: Question due to [place]betriq--d/c a couple of days ago?  Appears this.  For urinary retention.  Foley replaced. Will remove and start bladder program             -monitor voiding with PVR/bladder scan.   -Will ask nursing to get some PVRs 8/14 I see 1 bladder scan yesterday for  433 mL, unsure if this is before or after she voided.  Will asked nursing to document PVRs 8/15 bladder scan of 270, looks like she had to void a few minutes prior.  She is currently on Flomax .  Will asked nursing to check a few more.  If continues consider Urecholine  8/18 will add additional nursing order regarding PVR/bladder scans, do not see any additional ones completed -8/20 PVRs remain mildly elevated, increase flomax  to 0.8mg , she is declining IC -8/22 Urecholine  started yesterday, appears may be helping last PVR of 27, although prior to PVRs were in the low 200s.  Will increase Urecholine  to 10 mg 3 times daily -06/22/24 not many PVRs, but all fairly low, monitor -8/25 patient had IC last night, potentially increased confusion this morning although may be due to poor sleep last night.  Will check UA- indicates UTI, start keflex  -8/26 Cultures pending, last PVR 0  12. Dementia: Continue Donepezil .   13. Constipation: Had enema yesterday after no BM X one week - 8/10 lactulose  changed from daily to as needed, had a large bowel movement yesterday  8/11 last bm today. Pt/daughter satisfied with current regimen  8/13 LBM documented 6/11, will add prn Miralax  for mild constipation, encourage oral fluid intake  8/20 Miralax  34g , LBM 8/17  8/22 no BM yesterday  continue to  monitor  -06/23/24 LBM yesterday morning, cont regimen  -8/26 LBM today  14.  CKD 3b: Monitor with serial checks as well as UOP/voiding   - 8/9 creatinine 1.39, overall stable continue to monitor  8/11-a slight bump in BUN/Cr today (20/1.52).  I'm not too concerned at present. WIll encourage PO and follow up lab work on Wednesday.  8/14 Cr little higher at 1.58/BUN 25, still around overall baseline, continue encourage oral fluids again today 8/15 discussed oral fluid intake, continue to monitor labs.  If increases further consider some short-term IV fluids  8/16 Cr reviewed and is mildly improved  8/18 creatinine a little higher, will start some gentle IVF 8/20 BUN 24, Cr down to 1.42, recheck Friday, DC IVF. Encourage oral fluids 8/22 creatinine and BUN a little higher today at 1.5/28, continue to encourage oral fluids 8/25 IV fluids 75 mL per hour as creatinine up to 1.79/BUN 33.  Appears she continues to have poor p.o. intake 8/26 IVF changed to 7ml/hr, recheck tomorrow   15. Obesity. Class 2. Dietary counseling   -Body mass index is 34.11 kg/m.     LOS: 18 days A FACE TO FACE EVALUATION WAS PERFORMED  Murray Collier 06/25/2024, 2:39 PM

## 2024-06-25 NOTE — Progress Notes (Signed)
 Occupational Therapy Session Note  Patient Details  Name: [name] Mathews MRN: [id number] Date of Birth: May 16, 1934  Session 1: Today's Date: 06/25/2024 OT Individual Time: 1002-1101 OT Individual Time Calculation (min): 59 min   Session 2: Today's Date: 06/25/2024 OT Individual Time: 1331-1400 OT Individual Time Calculation (min): 29 min    Short Term Goals: Week 3:  OT Short Term Goal 1 (Week 3): STG=LTG d/t ELOS  Session 1: Skilled Therapeutic Interventions/Progress Updates:    Patient agreeable to participate in OT session. Reports 3/10  pain level, refused medication.   Patient participated in skilled OT session focusing on functional mobility, dynamic balance sititng and standing, UE strengthening, and stretching activities to decrease pain in RLE. Patient completed functional mobility to gym with increased speed with mod verbal cues for initiation of task. Patient completed UE strengthening and ydnamic sitting balance with 3 & 4 pound dowel rod to increase ability to complete ADLs. Completed foot taps on cones with min A and HHA for dynamic balance. Completed scapular retraction with yellow band. Functional mobility back to room with all needs in reach alarm on. Therapist promoted increased strengthening and activity tolerance  in order to improve ability to function at a sup level.    Session 2: Skilled Therapeutic Interventions/Progress Updates:    Patient agreeable to participate in OT session. Reports no pain level.   Patient participated in skilled OT session focusing on ADL completion, daughter education and sign off in room. Patient received in bed, completed bed mobility SUP. Completed toileting with SUP to CG with daugher. Completed peri hygiene Sup with rails. Daughter assisted with posterior peri hygiene. Completed light wash following toileting. Patient completed mobility back to bed..Therapist signed daughter off for in room mobility.  Patient completed functional  mobility 120 ft x2 to increase functional activity tolerance related to ADL participation. Returned to bed alarm on all needs in reach.    Therapy Documentation Precautions:  Precautions Precautions: Fall Recall of Precautions/Restrictions: Intact Restrictions Weight Bearing Restrictions Per Provider Order: No  Therapy/Group: Individual Therapy  D'mariea L Liset Mcmonigle 06/25/2024, 7:24 AM

## 2024-06-25 NOTE — Plan of Care (Signed)
  Problem: Consults Goal: RH GENERAL PATIENT EDUCATION Description: See Patient Education module for education specifics. Outcome: Progressing   Problem: RH BOWEL ELIMINATION Goal: RH STG MANAGE BOWEL WITH ASSISTANCE Description: STG Manage Bowel with Assistance. Outcome: Progressing   Problem: RH BLADDER ELIMINATION Goal: RH STG MANAGE BLADDER WITH ASSISTANCE Description: STG Manage Bladder With minimal  Assistance Outcome: Progressing   Problem: RH SKIN INTEGRITY Goal: RH STG SKIN FREE OF INFECTION/BREAKDOWN Description: Manage skin free of infection/breakdown with minimal assistance Outcome: Progressing   Problem: RH SAFETY Goal: RH STG ADHERE TO SAFETY PRECAUTIONS W/ASSISTANCE/DEVICE Description: STG Adhere to Safety Precautions With minimal assistance Assistance/Device. Outcome: Progressing

## 2024-06-26 DIAGNOSIS — K5900 Constipation, unspecified: Secondary | ICD-10-CM | POA: Diagnosis not present

## 2024-06-26 DIAGNOSIS — N179 Acute kidney failure, unspecified: Secondary | ICD-10-CM

## 2024-06-26 DIAGNOSIS — I10 Essential (primary) hypertension: Secondary | ICD-10-CM | POA: Diagnosis not present

## 2024-06-26 DIAGNOSIS — R5381 Other malaise: Secondary | ICD-10-CM | POA: Diagnosis not present

## 2024-06-26 DIAGNOSIS — R339 Retention of urine, unspecified: Secondary | ICD-10-CM | POA: Diagnosis not present

## 2024-06-26 LAB — BASIC METABOLIC PANEL WITH GFR
Anion gap: 6 (ref 5–15)
BUN: 32 mg/dL — ABNORMAL HIGH (ref 8–23)
CO2: 23 mmol/L (ref 22–32)
Calcium: 8.3 mg/dL — ABNORMAL LOW (ref 8.9–10.3)
Chloride: 107 mmol/L (ref 98–111)
Creatinine, Ser: 1.65 mg/dL — ABNORMAL HIGH (ref 0.44–1.00)
GFR, Estimated: 30 mL/min — ABNORMAL LOW (ref >60)
Glucose, Bld: 118 mg/dL — ABNORMAL HIGH (ref 70–99)
Potassium: 4.2 mmol/L (ref 3.5–5.1)
Sodium: 136 mmol/L (ref 135–145)

## 2024-06-26 LAB — CBC
HCT: 26.2 % — ABNORMAL LOW (ref 36.0–46.0)
Hemoglobin: 8.7 g/dL — ABNORMAL LOW (ref 12.0–15.0)
MCH: 29.7 pg (ref 26.0–34.0)
MCHC: 33.2 g/dL (ref 30.0–36.0)
MCV: 89.4 fL (ref 80.0–100.0)
Platelets: 193 K/uL (ref 150–400)
RBC: 2.93 MIL/uL — ABNORMAL LOW (ref 3.87–5.11)
RDW: 15.2 % (ref 11.5–15.5)
WBC: 6.4 K/uL (ref 4.0–10.5)
nRBC: 0 % (ref 0.0–0.2)

## 2024-06-26 LAB — GLUCOSE, CAPILLARY: Glucose-Capillary: 113 mg/dL — ABNORMAL HIGH (ref 70–99)

## 2024-06-26 LAB — URINE CULTURE: Culture: >=100000 — AB

## 2024-06-26 MED ORDER — HYDRALAZINE HCL 50 MG PO TABS
50.0000 mg | ORAL_TABLET | Freq: Three times a day (TID) | ORAL | Status: DC
  Administered 2024-06-26 – 2024-06-28 (×6): 50 mg via ORAL
  Filled 2024-06-26 (×6): qty 1

## 2024-06-26 MED ORDER — AMOXICILLIN 250 MG PO CAPS
250.0000 mg | ORAL_CAPSULE | Freq: Two times a day (BID) | ORAL | Status: DC
  Administered 2024-06-26 – 2024-06-29 (×6): 250 mg via ORAL
  Filled 2024-06-26 (×6): qty 1

## 2024-06-26 NOTE — Patient Care Conference (Signed)
 Inpatient RehabilitationTeam Conference and Plan of Care Update Date: 06/26/2024   Time: 1226 pm    Patient Name: [name] Mathews      Medical Record Number: [id number]  Date of Birth: 1934/05/23 Sex: Female         Room/Bed: 4W20C/4W20C-01 Payor Info: Payor: MEDICARE / Plan: MEDICARE PART A AND B / Product Type: *No Product type* /    Admit Date/Time:  06/07/2024 12:57 PM  Primary Diagnosis:  Debility  Hospital Problems: Principal Problem:   Debility    Expected Discharge Date: Expected Discharge Date: 06/29/24  Team Members Present: Physician leading conference: Dr. Murray Collier Social Worker Present: Rhoda Clement, LCSW Nurse Present: Eulalio Falls, RN PT Present: [name], PT OT Present: Other (comment) ANCEL Seip, OT)     Current Status/Progress Goal Weekly Team Focus  Bowel/Bladder   pt continent of b/b when time toileted, otherwise incontinent   Fully continent and able to call for assistance without time toileting   Continue time toileting and continue to educate pt on how to use the call bell    Swallow/Nutrition/ Hydration               ADL's   CG to sup   SUP to CG   activity tolerance, posterior hygiene, balance, mobility, bathing, fmaily training    Mobility   pt at goal level, family training completed 8/27 with pt daughter, daughter checked off to assist pt in room.   supervision/CGA  D/C 8/30, DME: pt needs rollator/transport chair    Communication                Safety/Cognition/ Behavioral Observations               Pain   No c/o pain   Remain pain free   Assess qshift and prn    Skin   No skin issues   Maintain skin integrity  Asssess shift and prn      Discharge Planning:  Daughter to be here for family education today and have ordered DME. HH set up via [facility]. daughter aware will need CGA level of assist    Team Discussion: Patient was admitted post debility due to Urinary tract infection. Patient with  right hip pain/ high blood pressure/urinary retention: medications adjusted by MD. Progress limited by poor endurance and poor posture stability.  Patient on target to meet rehab goals: yes, currently patient needs CGA with all ADLs, transfers and ambulation using a rolling walker. Overall goals at discharge are set for supervision/CGA.  *See Care Plan and progress notes for long and short-term goals.   Revisions to Treatment Plan:  Family Training Caregiver Education  Teaching Needs: Safety,medications, transfers, toileting, etc.   Current Barriers to Discharge: Decreased caregiver support, Home enviroment access/layout, and Incontinence  Possible Resolutions to Barriers: Family Education Home health follow up DME: rollator,Transport chair     Medical Summary Current Status: Debility, UTI, HTN, bladder retention, DM2, constipation, CKD 3B  Barriers to Discharge: Medical stability;Self-care education  Barriers to Discharge Comments: Debility, UTI, HTN, bladder retention, DM2, constipation, CKD 3B Possible Resolutions to [employer] Focus: change abx for UTI, IVF/recheck labs, montior bladder function   Continued Need for Acute Rehabilitation Level of Care: The patient requires daily medical management by a physician with specialized training in physical medicine and rehabilitation for the following reasons: Direction of a multidisciplinary physical rehabilitation program to maximize functional independence : Yes Medical management of patient stability for increased activity during  participation in an intensive rehabilitation regime.: Yes Analysis of laboratory values and/or radiology reports with any subsequent need for medication adjustment and/or medical intervention. : Yes   I attest that I was present, lead the team conference, and concur with the assessment and plan of the team.   Sanam Marmo Gayo 06/26/2024, 1226 pm

## 2024-06-26 NOTE — Progress Notes (Signed)
 Physical Therapy Session Note  Patient Details  Name: [name] Mathews MRN: [id number] Date of Birth: 1934/01/30  Today's Date: 06/26/2024 PT Individual Time: 1048-1130, 1350-1448 PT Individual Time Calculation (min): 42 min, 58 min  Short Term Goals: Week 3:  PT Short Term Goal 1 (Week 3): STG=LTG 2/2 ELOS  Skilled Therapeutic Interventions/Progress Updates:      Treatment Session 1   Pt seated in rollator at sink brushing teeth upon arrival. Pt agreeable to therapy. Pt denies any pain.   Completed family training with patient daughter. Therapist provided education regarding pt impairments with emphasis on endurance. Recommended use of rollator and supervision for pt overall safety. Education provided regarding energy conservation techniques, recognizing signs and symptoms of fatigue, and methods to reduce fall risk. Pt and daughter returned demonstration of ambulation x150+ feet with rollator, seated rest break on rollator, sit to stand/stand pivot transfer with rollator, stair navigation with B handrails and CGA. Recommended pt have chair on landing of stairs to allow pt to take seated rest break between flights for access to bedroom. Pt daughter provided teach back of appropriate cuing and pt handling, and recommendations. Pt daughter confirms she will be getting a rollator and transport chair.   Pt supine in bed at end of session with all needs within reach and bed alarm on.   Treatment Session 2  Pt supine in bed upon arrival. Pt agreeable to therapy with encouragement. Pt denies any pain.   Nurse present to administer medication and change IV.   Pt incontinent of bladder. Pt performed sit to stand, and ambulatory transfer to toielt with rollator and supervision. Pt continent of bladder. Therapist changed brief with total A for time/energy conservation. Pt donned/doffed pants/brief over buttocks while standing with rollator and supervision.   Pt required seated rest break on rollator  for fatigue. Pt washed hands while seated on rollator. Pt performed sit to stand/stand to sit on rollator in front of sink with CGA to stabilize rollator, verbal cues provided for sequencing.   Pt ambulated room to ortho gym, ortho gym to room with rollator and supervision with 2 prolonged seated rest breaks for fatigue.   Pt performed ambulatory transfer to car simulator at lowest setting as pt daughter reports she has option of low level sedan or SUV with running board for entry. Pt required min A for power up from low level sedan, verbal cues provided for UE positioning.   Pt supine in bed at end of session with all needs within reach and bed alarm on.       Therapy Documentation Precautions:  Precautions Precautions: Fall Recall of Precautions/Restrictions: Intact Restrictions Weight Bearing Restrictions Per Provider Order: No   Therapy/Group: Individual Therapy  [facility], [place], DPT  06/26/2024, 7:52 AM

## 2024-06-26 NOTE — Progress Notes (Signed)
 Occupational Therapy Session Note  Patient Details  Name: [name] Mathews MRN: [id number] Date of Birth: 12/06/1933  Session 1: Today's Date: 06/26/2024 OT Individual Time: 9269-9169 OT Individual Time Calculation (min): 60 min   Session 2: Today's Date: 06/26/2024 OT Individual Time: 8995-8951 OT Individual Time Calculation (min): 44 min   Short Term Goals: Week 3:  OT Short Term Goal 1 (Week 3): STG=LTG d/t ELOS  Session 1: Skilled Therapeutic Interventions/Progress Updates:    Patient agreeable to participate in OT session. Reports 5/10 pain level, did not want pain medication.   Patient participated in skilled OT session focusing on ADL completion, functional mobility, balance, functional activity tolerance. Patient received on EOB completing eating IND. Completed LB dressing underwear with SUP assist. Completed funcitonal mobility 125 ft to gym Sup withrollator to increase community mobility. Completed nu step to increase ability to complete ADLs with increased safety and less rest required. Patient completed functional mobility back to room all with rollator SUP all needs in reach alarm on.   Session 1: Skilled Therapeutic Interventions/Progress Updates:    Completed family training with patient daughter. Therapist educated family regarding patient self care dressing, showering, and energy conservation. Education also provided on strategies and compensatory techniques to use if patient were to require additional assistance for safe Adl completion. Education provided for gait belt use and handling technique. daughter completed hands on training for functional mobility with rollator and shower/ toilet transfer with therapist providing VC as needed for technique and form. All education completed and all questions. Patient completed all [place], toileting, showering, dressing, grooming with SUP to CGA. Passed off to PT  Therapy Documentation Precautions:  Precautions Precautions:  Fall Recall of Precautions/Restrictions: Intact Restrictions Weight Bearing Restrictions Per Provider Order: No  Therapy/Group: Individual Therapy  D'mariea L Jovana Rembold 06/26/2024, 7:15 AM

## 2024-06-26 NOTE — Progress Notes (Addendum)
 PROGRESS NOTE   Subjective/Complaints:  No new complaints this AM. Daughter asking about zinc  supplements- she is taking this.  ROS: as per HPI. Denies Chills, CP, SOB, abd pain, N/V/D/C, or any other complaints at this time.   +Rt hip pain -improved + Urinary retention- improved  Objective:   No results found.  Recent Labs    06/24/24 0504 06/26/24 0433  WBC 6.1 6.4  HGB 8.7* 8.7*  HCT 26.5* 26.2*  PLT 190 193    Recent Labs    06/24/24 0504 06/26/24 0433  NA 137 136  K 4.1 4.2  CL 109 107  CO2 21* 23  GLUCOSE 127* 118*  BUN 33* 32*  CREATININE 1.79* 1.65*  CALCIUM  8.3* 8.3*    Intake/Output Summary (Last 24 hours) at 06/26/2024 1202 Last data filed at 06/26/2024 0842 Gross per 24 hour  Intake 1080 ml  Output --  Net 1080 ml         Physical Exam: Vital Signs Blood pressure (!) 158/60, pulse 62, temperature 98 F (36.7 C), resp. rate 17, height 5' 7 (1.702 m), weight 98.8 kg, SpO2 100%.  General: No acute distress, laying in bed appears comfortable HEENT: NCAT, EOMI, oral membranes moist Cards: Bradycardic but no m/r/g Chest: Clear to auscultation bilaterally, nonlabored breathing  abdomen: Soft, NT, ND, + BS normal active Skin: No breakdown noted on visible portion of skin Extremities:  trace b/l edema Psych: pleasant and appropriate  MSK:      No apparent deformity.  No significant greater trochanter tenderness today       Neurologic exam: Alert awake, cranial nerves II through XII grossly intact, fluent, sensation intact light touch in all 4 extremities       Strength: Bilateral upper extremities 4/5 proximally, 5/5 distally      Bilateral lower extremities 4/5 proximally, 4/5 distally  Prior neuro assessment is c/w today's exam 06/26/2024.   Assessment/Plan: 1. Functional deficits which require 3+ hours per day of interdisciplinary therapy in a comprehensive inpatient rehab  setting. Physiatrist is providing close team supervision and 24 hour management of active medical problems listed below. Physiatrist and rehab team continue to assess barriers to discharge/monitor patient progress toward functional and medical goals  Care Tool:  Bathing    Body parts bathed by patient: Right arm, Left arm, Chest, Abdomen, Right upper leg, Left upper leg, Right lower leg, Left lower leg, Face     Body parts n/a: Buttocks, Front perineal area, Right lower leg, Left lower leg   Bathing assist Assist Level: Contact Guard/Touching assist     Upper Body Dressing/Undressing Upper body dressing   What is the patient wearing?: Pull over shirt    Upper body assist Assist Level: Set up assist    Lower Body Dressing/Undressing Lower body dressing      What is the patient wearing?: Underwear/pull up, Pants     Lower body assist Assist for lower body dressing: Moderate Assistance - Patient 50 - 74%     Toileting Toileting    Toileting assist Assist for toileting: Maximal Assistance - Patient 25 - 49%     Transfers Chair/bed transfer  Transfers assist  Chair/bed  transfer activity did not occur: Safety/medical concerns  Chair/bed transfer assist level: Contact Guard/Touching assist     Locomotion Ambulation   Ambulation assist      Assist level: Contact Guard/Touching assist Assistive device: Walker-rolling Max distance: 156   Walk 10 feet activity   Assist     Assist level: Contact Guard/Touching assist Assistive device: Walker-rolling   Walk 50 feet activity   Assist Walk 50 feet with 2 turns activity did not occur: Safety/medical concerns  Assist level: Contact Guard/Touching assist Assistive device: Walker-rolling    Walk 150 feet activity   Assist Walk 150 feet activity did not occur: Safety/medical concerns (fatigue)  Assist level: Contact Guard/Touching assist Assistive device: Walker-rolling    Walk 10 feet on uneven  surface  activity   Assist Walk 10 feet on uneven surfaces activity did not occur: Safety/medical concerns   Assist level: Minimal Assistance - Patient > 75% Assistive device: Walker-rolling   Wheelchair     Assist Is the patient using a wheelchair?: No Type of Wheelchair: Manual    Wheelchair assist level: Dependent - Patient 0%      Wheelchair 50 feet with 2 turns activity    Assist        Assist Level: Dependent - Patient 0%   Wheelchair 150 feet activity     Assist      Assist Level: Dependent - Patient 0%   Blood pressure (!) 158/60, pulse 62, temperature 98 F (36.7 C), resp. rate 17, height 5' 7 (1.702 m), weight 98.8 kg, SpO2 100%.  Medical Problem List and Plan: 1. Functional deficits secondary to debility s/p UTI             -patient may shower             -ELOS/Goals: 21-24 days, Min A PT/OT             -Continue CIR therapies including PT, OT    - Expected discharge 8/30  -Team conference today please see physician documentation under team conference tab, met with team  to discuss problems,progress, and goals. Formulized individual treatment plan based on medical history, underlying problem and comorbidities.    2.  Antithrombotics: -DVT/anticoagulation:  Pharmaceutical: continue Lovenox  30mg  daily             -antiplatelet therapy: N/A  3. Pain Management: continue Tylenol  prn.  -8/13 patient like to avoid stronger pain medications.  Having some right hip pain after stairs.  Will try Tylenol  and heating pad for now. 8/14 hip pain doing a little better, continue current regimen for now, we will see how she does with therapy 8/15 discussed scheduling some Tylenol  before therapy, patient declines for now.  She reports overall controlled  8/18 will schedule Tylenol  in the morning, patient agreeable  8/19 Xray R hip- B/L hip OA, lidocaine  patch  8/21-22 pain overall under control, continue to monitor  8/26-27 Pain reported to be controlled,  continue current regimen   4. Mood/Behavior/Sleep: LCSW to follow for evaluation and support.              -antipsychotic agents: N/A 5. Neuropsych/cognition: This patient is not fully capable of making decisions on her own behalf. 6. Skin/Wound Care: Routine pressure relief meausre.  7. Fluids/Electrolytes/Nutrition: Monitor I/O. Check CMET in am.  8.HTN: Monitor BP TID- continue amlodipine  and hydralazine .  8/9 increase hydralazine  to 37.5mg  BID 8/10 reports patient had been advised to take amlodipine  at night, medication timing adjusted.  She was  also taking nebivolol .  Will hold off on adding this today as we are changing amlodipine  timing.  If BP still elevated tomorrow consider adding low-dose Nebivolol  8/12 will restart Nebivolol  at 2.5 mg  8/13 increase Nebivolol  to 5mg  8/14 BP controlled doing better overall, continue current regimen and monitor  8/17: decrease amlodipine  to 5mg  given diastolic hypotension  - 8/18 fair control continue current regimen and monitor for now - 8/19 systolic BP intermittently elevated, diastolic a little low.  Will increase amlodipine  back to 10 mg but decrease Nebivolol  to 2.5 mg -8/20 systolic blood pressure remains elevated , increase flomax , consider increase Nebivolol  back to 5 mg, monitor HR  8/21 increase Nebivolol  back to 5 mg  8/22 fair BP control, continue to monitor trend  -8/25 increase hydralazine  to every 8 hours  -8/27 Increase hydralazine  to 50mg  Q8H    06/26/2024    3:31 PM 06/26/2024    4:53 AM 06/25/2024    8:09 PM  Vitals with BMI  Systolic 155 158 838  Diastolic 52 60 54  Pulse 57 62     9. Anemia of chronic disease: Negative work up by Dr. Freddie and Dr Lanny last month.     - Appears to be gradually trending down on review on records. Check FOBT  -8/13 hemoglobin overall stable at 8.4, discussed FOBT nursing team  -8/14 hemoglobin remained stable at 8.4  -8/18 no FOBT completed yet but hemoglobin appears stable  -8/25  hemoglobin stable at 8.7  -8/26 recheck tomorrow   10. T2DM: Diet controlled- Monitor BS ac/hs.  Change CBGs to just daily  -06/22/24 still getting CBGs 4x/d, will alert nursing staff. CBGs <200  -06/23/24 CBG this morning great at 118. Just monitor daily, SSI stopped  8/25 CBG stable  Recheck on BMP tomorrow   CBG (last 3)  Recent Labs    06/24/24 0535 06/26/24 0633  GLUCAP 123* 113*    11. H/o UTIs/Urinary retention: Question due to Merbetriq--d/c a couple of days ago?  Appears this.  For urinary retention.  Foley replaced. Will remove and start bladder program             -monitor voiding with PVR/bladder scan.   -Will ask nursing to get some PVRs 8/14 I see 1 bladder scan yesterday for  433 mL, unsure if this is before or after she voided.  Will asked nursing to document PVRs 8/15 bladder scan of 270, looks like she had to void a few minutes prior.  She is currently on Flomax .  Will asked nursing to check a few more.  If continues consider Urecholine  8/18 will add additional nursing order regarding PVR/bladder scans, do not see any additional ones completed -8/20 PVRs remain mildly elevated, increase flomax  to 0.8mg , she is declining IC -8/22 Urecholine  started yesterday, appears may be helping last PVR of 27, although prior to PVRs were in the low 200s.  Will increase Urecholine  to 10 mg 3 times daily -06/22/24 not many PVRs, but all fairly low, monitor -8/25 patient had IC last night, potentially increased confusion this morning although may be due to poor sleep last night.  Will check UA- indicates UTI, start keflex  -8/26 Cultures pending, last PVR 0 -8/27 Abx changed to amoxicillin  250mg  for 5 days  12. Dementia: Continue Donepezil .   13. Constipation: Had enema yesterday after no BM X one week - 8/10 lactulose  changed from daily to as needed, had a large bowel movement yesterday  8/11 last bm today. Pt/daughter  satisfied with current regimen  8/13 LBM documented 6/11, will  add prn Miralax  for mild constipation, encourage oral fluid intake  8/20 Miralax  34g , LBM 8/17  8/22 no BM yesterday continue to monitor  -06/23/24 LBM yesterday morning, cont regimen  -8/27 LBM today  14.  CKD 3b: Monitor with serial checks as well as UOP/voiding   - 8/9 creatinine 1.39, overall stable continue to monitor  8/11-a slight bump in BUN/Cr today (20/1.52).  I'm not too concerned at present. WIll encourage PO and follow up lab work on Wednesday.  8/14 Cr little higher at 1.58/BUN 25, still around overall baseline, continue encourage oral fluids again today 8/15 discussed oral fluid intake, continue to monitor labs.  If increases further consider some short-term IV fluids  8/16 Cr reviewed and is mildly improved  8/18 creatinine a little higher, will start some gentle IVF 8/20 BUN 24, Cr down to 1.42, recheck Friday, DC IVF. Encourage oral fluids 8/22 creatinine and BUN a little higher today at 1.5/28, continue to encourage oral fluids 8/25 IV fluids 75 mL per hour as creatinine up to 1.79/BUN 33.  Appears she continues to have poor p.o. intake 8/26 IVF changed to 64ml/hr, recheck tomorrow  8/27 Cr Trending down to 1.65 increase to 75ml /hr recheck tomorrow  15. Obesity. Class 2. Dietary counseling   -Body mass index is 34.11 kg/m.     LOS: 19 days A FACE TO FACE EVALUATION WAS PERFORMED  Murray Collier 06/26/2024, 12:02 PM

## 2024-06-26 NOTE — Plan of Care (Signed)
  Problem: Consults Goal: RH GENERAL PATIENT EDUCATION Description: See Patient Education module for education specifics. Outcome: Progressing   Problem: RH BOWEL ELIMINATION Goal: RH STG MANAGE BOWEL WITH ASSISTANCE Description: STG Manage Bowel with Assistance. Outcome: Progressing   Problem: RH BLADDER ELIMINATION Goal: RH STG MANAGE BLADDER WITH ASSISTANCE Description: STG Manage Bladder With minimal  Assistance Outcome: Progressing   Problem: RH SKIN INTEGRITY Goal: RH STG SKIN FREE OF INFECTION/BREAKDOWN Description: Manage skin free of infection/breakdown with minimal assistance Outcome: Progressing   Problem: RH SAFETY Goal: RH STG ADHERE TO SAFETY PRECAUTIONS W/ASSISTANCE/DEVICE Description: STG Adhere to Safety Precautions With minimal assistance Assistance/Device. Outcome: Progressing   Problem: Health Behavior/Discharge Planning: Goal: Ability to identify and utilize available resources and services will improve Outcome: Progressing Goal: Ability to manage health-related needs will improve Outcome: Progressing

## 2024-06-26 NOTE — Progress Notes (Signed)
 Patient ID: SHI GROSE, female   DOB: 04-20-1934, 89 y.o.   MRN: [id number]  Daughter here for hands on education with mom. It went well and gave update on team conference and reaching goals of supervision-CGA. Still planning on discharge of 8/30. Daughter aware rollator not covered due to received one in 01/2024 and have ordered transport chair to see if covered. Pt doing well with the steps and feels ready for discharge.

## 2024-06-27 DIAGNOSIS — K5900 Constipation, unspecified: Secondary | ICD-10-CM | POA: Diagnosis not present

## 2024-06-27 DIAGNOSIS — I10 Essential (primary) hypertension: Secondary | ICD-10-CM | POA: Diagnosis not present

## 2024-06-27 DIAGNOSIS — R339 Retention of urine, unspecified: Secondary | ICD-10-CM | POA: Diagnosis not present

## 2024-06-27 DIAGNOSIS — R5381 Other malaise: Secondary | ICD-10-CM | POA: Diagnosis not present

## 2024-06-27 LAB — BASIC METABOLIC PANEL WITH GFR
Anion gap: 9 (ref 5–15)
BUN: 32 mg/dL — ABNORMAL HIGH (ref 8–23)
CO2: 21 mmol/L — ABNORMAL LOW (ref 22–32)
Calcium: 8.3 mg/dL — ABNORMAL LOW (ref 8.9–10.3)
Chloride: 108 mmol/L (ref 98–111)
Creatinine, Ser: 1.57 mg/dL — ABNORMAL HIGH (ref 0.44–1.00)
GFR, Estimated: 32 mL/min — ABNORMAL LOW (ref >60)
Glucose, Bld: 118 mg/dL — ABNORMAL HIGH (ref 70–99)
Potassium: 3.9 mmol/L (ref 3.5–5.1)
Sodium: 138 mmol/L (ref 135–145)

## 2024-06-27 LAB — CBC
HCT: 25.2 % — ABNORMAL LOW (ref 36.0–46.0)
Hemoglobin: 8.3 g/dL — ABNORMAL LOW (ref 12.0–15.0)
MCH: 29.5 pg (ref 26.0–34.0)
MCHC: 32.9 g/dL (ref 30.0–36.0)
MCV: 89.7 fL (ref 80.0–100.0)
Platelets: 180 K/uL (ref 150–400)
RBC: 2.81 MIL/uL — ABNORMAL LOW (ref 3.87–5.11)
RDW: 15.2 % (ref 11.5–15.5)
WBC: 5.9 K/uL (ref 4.0–10.5)
nRBC: 0 % (ref 0.0–0.2)

## 2024-06-27 LAB — GLUCOSE, CAPILLARY: Glucose-Capillary: 118 mg/dL — ABNORMAL HIGH (ref 70–99)

## 2024-06-27 MED ORDER — BETHANECHOL CHLORIDE 10 MG PO TABS
20.0000 mg | ORAL_TABLET | Freq: Three times a day (TID) | ORAL | Status: DC
  Administered 2024-06-27 – 2024-06-29 (×6): 20 mg via ORAL
  Filled 2024-06-27 (×6): qty 2

## 2024-06-27 NOTE — Progress Notes (Signed)
 Physical Therapy Session Note  Patient Details  Name: [name] Mathews MRN: [id number] Date of Birth: 1934/02/26  Today's Date: 06/27/2024 PT Individual Time: 0720-0830  PT Individual Time Calculation (min): 70 min  Short Term Goals: Week 3:  PT Short Term Goal 1 (Week 3): STG=LTG 2/2 ELOS  Skilled Therapeutic Interventions/Progress Updates:  Chart reviewed and pt agreeable to therapy. Pt received sitting EOB with 0/10 c/o pain. Session focused on functional transfers, balance, ambulation, and activity tolerance to promote safe home mobility and access. Pt initiated session with amb to sink using S + rollator. Pt then completed upper and lower body bathing at sink with distant S while seated in rollator. Pt also required distant S for dynamic standing balance while washing lower body at sink. Pt displayed safe use of DME t/o task. Pt then completed amb of 216ft to therapy gym using CGA + rollator fading to S + rollator. Pt then completed balance exercise of corn hole tosses with S + rollator. Pt noted to require multiple seated rest breaks every 5-8 tosses. Pt then amb to room for 267ft using S + rollator + 1 seated rest break with same DME use.  At end of session, pt was left seated in recliner with alarm engaged, nurse call bell and all needs in reach.     Therapy Documentation Precautions:  Precautions Precautions: Fall Recall of Precautions/Restrictions: Intact Restrictions Weight Bearing Restrictions Per Provider Order: No General:      Therapy/Group: Individual Therapy   [name] 06/27/2024, 8:35 AM

## 2024-06-27 NOTE — Progress Notes (Signed)
 Physical Therapy Session Note  Patient Details  Name: [name] Mathews MRN: [id number] Date of Birth: 1934/08/29  Today's Date: 06/27/2024 PT Individual Time: 1009-1045 PT Individual Time Calculation (min): 36 min  and Today's Date: 06/27/2024 PT Missed Time: 39 Minutes Missed Time Reason: Patient fatigue;Patient unwilling to participate  Short Term Goals: Week 3:  PT Short Term Goal 1 (Week 3): STG=LTG 2/2 ELOS  Skilled Therapeutic Interventions/Progress Updates:     Pt seated in recliner upon arrival. Pt agreeable to therapy but reports significnat fatigue post AM PT session. Pt refusing to leave room 2/2 fatigue. PT initiated discharge assessment. Reviewed HEP, handout provided. Contacted daughter via phone and informed her of HEP. Recommended daughter/caregiver be with pt while performing and allow seated rest breaks as needed with fatigue. Pt daughter verbalized understanding and agreeable.   Access Code: [email or web address] URL: [email or web address] Date: 06/27/2024 Prepared by: [name]  Exercises - Sit to Stand with Armchair  - 1 x daily - 7 x weekly - 3 sets - 5 reps - Standing Hip Abduction with Counter Support  - 1 x daily - 7 x weekly - 3 sets - 5 reps - Standing Hip Extension with Counter Support  - 1 x daily - 7 x weekly - 3 sets - 5 reps - Standing March with Counter Support  - 1 x daily - 7 x weekly - 3 sets - 5 reps  Therapy Documentation Precautions:  Precautions Precautions: Fall Recall of Precautions/Restrictions: Intact Restrictions Weight Bearing Restrictions Per Provider Order: No  Therapy/Group: Individual Therapy  [facility] [place], [place], DPT  06/27/2024, 12:54 PM

## 2024-06-27 NOTE — Progress Notes (Signed)
 Occupational Therapy Session Note  Patient Details  Name: [name] Mathews MRN: [id number] Date of Birth: 1934/08/29  Today's Date: 06/27/2024 OT Individual Time: 1300-1400 OT Individual Time Calculation (min): 60 min    Short Term Goals: Week 3:  OT Short Term Goal 1 (Week 3): STG=LTG d/t ELOS  Skilled Therapeutic Interventions/Progress Updates:   Patient agreeable to participate in OT session. Reports 3/10 pain level, no medication.   Patient participated in skilled OT session focusing on ADL completion, functional mobility, and UE strength and dynamic balance. Completed functional mobility SUP for 200 ft. Completed UE strengthening and dynamic balance on edge of mat table. Patient able to complete with decreased rest needed. Completed toileting with SUP A to CGA with all parts. Increased time required for difficulty to void. Returned to chair all needs in reach.   Therapy Documentation Precautions:  Precautions Precautions: Fall Recall of Precautions/Restrictions: Intact Restrictions Weight Bearing Restrictions Per Provider Order: No  Therapy/Group: Individual Therapy  D'mariea L Agnieszka Newhouse 06/27/2024, 7:27 AM

## 2024-06-27 NOTE — Progress Notes (Signed)
 Physical Therapy Discharge Summary  Patient Details  Name: [name] Mathews MRN: [id number] Date of Birth: 1934-10-17  Date of Discharge from PT service:June 28, 2024   Patient has met 7 of 8 long term goals due to improved activity tolerance, improved balance, improved postural control, increased strength, decreased pain, ability to compensate for deficits, improved attention, improved awareness, and improved coordination.  Patient to discharge at an ambulatory level Supervision with rollator. Patient's care partner is independent to provide the necessary cognitive assistance at discharge.  Reasons goals not met: pt able to navigate 17 steps for access to bedroom/bathroom upstairs but pt requires seated rest break 2/2 fatigue; however adequate for discharge 2/2 pt has 9 steps, landing, 8 steps.   Recommendation:  Patient will benefit from ongoing skilled PT services in home health setting to continue to advance safe functional mobility, address ongoing impairments in , and minimize fall risk.  Equipment: Rollator, transport chair   Reasons for discharge: treatment goals met and discharge from hospital  Patient/family agrees with progress made and goals achieved: Yes  PT Discharge Precautions/Restrictions Precautions Precautions: Fall Recall of Precautions/Restrictions: Intact Restrictions Weight Bearing Restrictions Per Provider Order: No Pain Interference Pain Interference Pain Effect on Sleep: 2. Occasionally Pain Interference with Therapy Activities: 1. Rarely or not at all Pain Interference with Day-to-Day Activities: 1. Rarely or not at all Vision/Perception  Vision - History Ability to See in Adequate Light: 0 Adequate Perception Perception: Within Functional Limits Praxis Praxis: WFL  Cognition Overall Cognitive Status: History of cognitive impairments - at baseline Arousal/Alertness: Awake/alert Orientation Level: Oriented X4 Year: 2025 Month: August Day of  Week: Incorrect Attention: Sustained Sustained Attention: Impaired Memory: Impaired Awareness: Appears intact Problem Solving: Impaired Executive Function: Landscape architect: Impaired Safety/Judgment: Appears intact Sensation Sensation Light Touch: Appears Intact Hot/Cold: Not tested Proprioception: Not tested Stereognosis: Not tested Additional Comments: generalized debility Coordination Gross Motor Movements are Fluid and Coordinated: Yes Fine Motor Movements are Fluid and Coordinated: Yes Motor  Motor Motor: Within Functional Limits Motor - Skilled Clinical Observations: decreased smoothness and accuracy secondary to debility and global strength deficits  Mobility Bed Mobility Bed Mobility: Supine to Sit;Sit to Supine;Rolling Left;Rolling Right Rolling Right: Independent with assistive device Rolling Left: Independent with assistive device Supine to Sit: Independent with assistive device Sit to Supine: Independent with assistive device Transfers Transfers: Sit to Stand;Stand to Sit;Stand Pivot Transfers Sit to Stand: Supervision/Verbal cueing Stand to Sit: Supervision/Verbal cueing Stand Pivot Transfers: Supervision/Verbal cueing Stand Pivot Transfer Details: Visual cues/gestures for precautions/safety;Tactile cues for posture;Verbal cues for safe use of DME/AE;Verbal cues for precautions/safety Transfer (Assistive device): Rollator Locomotion  Gait Ambulation: Yes Gait Assistance: Supervision/Verbal cueing Gait Distance (Feet): 150 Feet Assistive device: Rollator Gait Assistance Details: Verbal cues for precautions/safety;Verbal cues for technique;Verbal cues for safe use of DME/AE Gait Gait: Yes Gait Pattern: Impaired Gait Pattern: Trunk flexed;Poor foot clearance - right;Poor foot clearance - left;Step-through pattern Gait velocity: decreased Stairs / Additional Locomotion Stairs: Yes Stairs Assistance: Supervision/Verbal cueing;Contact Guard/Touching  assist Stair Management Technique: Two rails Number of Stairs: 12 (6 inch) Height of Stairs: 6 Ramp: Contact Guard/touching assist Wheelchair Mobility Wheelchair Mobility: No  Trunk/Postural Assessment  Cervical Assessment Cervical Assessment: Within Functional Limits Thoracic Assessment Thoracic Assessment: Exceptions to WFL (kyphosis) Lumbar Assessment Lumbar Assessment: Exceptions to [facility] (posterior pelvic tilt) Postural Control Postural Control: Deficits on evaluation Righting Reactions: delayed Protective Responses: delayed Postural Limitations: decreased  Balance Balance Balance Assessed: Yes Static Sitting Balance Static Sitting - Level of  Assistance: 6: Modified independent (Device/Increase time) Dynamic Sitting Balance Dynamic Sitting - Balance Support: Feet supported Dynamic Sitting - Level of Assistance: 5: Stand by assistance Static Standing Balance Static Standing - Balance Support: Bilateral upper extremity supported;During functional activity Static Standing - Level of Assistance: 5: Stand by assistance Dynamic Standing Balance Dynamic Standing - Balance Support: Bilateral upper extremity supported;During functional activity Dynamic Standing - Level of Assistance: 5: Stand by assistance Extremity Assessment  RLE Assessment RLE Assessment: Exceptions to [facility] Passive Range of Motion (PROM) Comments: decreased DF lacking ~5* from neutral General Strength Comments: grossly 4/5 LLE Assessment LLE Assessment: Exceptions to [facility] General Strength Comments: grossly 4/5   [name] 06/27/2024, 10:24 AM

## 2024-06-27 NOTE — Progress Notes (Signed)
 PROGRESS NOTE   Subjective/Complaints:  No new complaints or concerns this AM noted. Pain overall under control. Looking forward to DC home soon.   ROS: as per HPI. Denies Chills, CP, SOB, abd pain, N/V/D/C, or any other complaints at this time.   +Rt hip pain -improved + Urinary retention- continued, but not requiring frequent IC  Objective:   No results found.  Recent Labs    06/26/24 0433 06/27/24 0505  WBC 6.4 5.9  HGB 8.7* 8.3*  HCT 26.2* 25.2*  PLT 193 180    Recent Labs    06/26/24 0433 06/27/24 0505  NA 136 138  K 4.2 3.9  CL 107 108  CO2 23 21*  GLUCOSE 118* 118*  BUN 32* 32*  CREATININE 1.65* 1.57*  CALCIUM  8.3* 8.3*    Intake/Output Summary (Last 24 hours) at 06/27/2024 1205 Last data filed at 06/26/2024 1859 Gross per 24 hour  Intake 480 ml  Output --  Net 480 ml         Physical Exam: Vital Signs Blood pressure (!) 149/53, pulse 64, temperature 99.4 F (37.4 C), resp. rate 15, height 5' 7 (1.702 m), weight 98.8 kg, SpO2 100%.  General: No acute distress, sitting in bedside chair HEENT: NCAT, EOMI, oral membranes dry Cards: RRR  no m/r/g Chest: Clear to auscultation bilaterally, nonlabored breathing  abdomen: Soft, NT, ND, + BS normal active Skin: No breakdown noted on visible portion of skin Extremities:  trace b/l edema Psych: pleasant and appropriate  MSK:      No apparent deformity.  No significant greater trochanter tenderness today       Neurologic exam: Alert awake, cranial nerves II through XII grossly intact, fluent, sensation intact light touch in all 4 extremities       Strength: Bilateral upper extremities 4/5 proximally, 5/5 distally      Bilateral lower extremities 4/5 proximally, 4/5 distally  Prior neuro assessment is c/w today's exam 06/27/2024.   Assessment/Plan: 1. Functional deficits which require 3+ hours per day of interdisciplinary therapy in a  comprehensive inpatient rehab setting. Physiatrist is providing close team supervision and 24 hour management of active medical problems listed below. Physiatrist and rehab team continue to assess barriers to discharge/monitor patient progress toward functional and medical goals  Care Tool:  Bathing    Body parts bathed by patient: Right arm, Left arm, Chest, Abdomen, Right upper leg, Left upper leg, Right lower leg, Left lower leg, Face     Body parts n/a: Buttocks, Front perineal area, Right lower leg, Left lower leg   Bathing assist Assist Level: Contact Guard/Touching assist     Upper Body Dressing/Undressing Upper body dressing   What is the patient wearing?: Pull over shirt    Upper body assist Assist Level: Set up assist    Lower Body Dressing/Undressing Lower body dressing      What is the patient wearing?: Underwear/pull up, Pants     Lower body assist Assist for lower body dressing: Moderate Assistance - Patient 50 - 74%     Toileting Toileting    Toileting assist Assist for toileting: Maximal Assistance - Patient 25 - 49%  Transfers Chair/bed transfer  Transfers assist  Chair/bed transfer activity did not occur: Safety/medical concerns  Chair/bed transfer assist level: Contact Guard/Touching assist     Locomotion Ambulation   Ambulation assist      Assist level: Contact Guard/Touching assist Assistive device: Walker-rolling Max distance: 156   Walk 10 feet activity   Assist     Assist level: Contact Guard/Touching assist Assistive device: Walker-rolling   Walk 50 feet activity   Assist Walk 50 feet with 2 turns activity did not occur: Safety/medical concerns  Assist level: Contact Guard/Touching assist Assistive device: Walker-rolling    Walk 150 feet activity   Assist Walk 150 feet activity did not occur: Safety/medical concerns (fatigue)  Assist level: Contact Guard/Touching assist Assistive device: Walker-rolling     Walk 10 feet on uneven surface  activity   Assist Walk 10 feet on uneven surfaces activity did not occur: Safety/medical concerns   Assist level: Minimal Assistance - Patient > 75% Assistive device: Walker-rolling   Wheelchair     Assist Is the patient using a wheelchair?: No Type of Wheelchair: Manual    Wheelchair assist level: Dependent - Patient 0%      Wheelchair 50 feet with 2 turns activity    Assist        Assist Level: Dependent - Patient 0%   Wheelchair 150 feet activity     Assist      Assist Level: Dependent - Patient 0%   Blood pressure (!) 149/53, pulse 64, temperature 99.4 F (37.4 C), resp. rate 15, height 5' 7 (1.702 m), weight 98.8 kg, SpO2 100%.  Medical Problem List and Plan: 1. Functional deficits secondary to debility s/p UTI             -patient may shower             -ELOS/Goals: 21-24 days, Min A PT/OT             -Continue CIR therapies including PT, OT    - Expected discharge 8/30   2.  Antithrombotics: -DVT/anticoagulation:  Pharmaceutical: continue Lovenox  30mg  daily             -antiplatelet therapy: N/A  3. Pain Management: continue Tylenol  prn.  -8/13 patient like to avoid stronger pain medications.  Having some right hip pain after stairs.  Will try Tylenol  and heating pad for now. 8/14 hip pain doing a little better, continue current regimen for now, we will see how she does with therapy 8/15 discussed scheduling some Tylenol  before therapy, patient declines for now.  She reports overall controlled  8/18 will schedule Tylenol  in the morning, patient agreeable  8/19 Xray R hip- B/L hip OA, lidocaine  patch  8/21-22 pain overall under control, continue to monitor  8/26-28 Pain reported to be controlled, continue current regimen   4. Mood/Behavior/Sleep: LCSW to follow for evaluation and support.              -antipsychotic agents: N/A 5. Neuropsych/cognition: This patient is not fully capable of making  decisions on her own behalf. 6. Skin/Wound Care: Routine pressure relief meausre.  7. Fluids/Electrolytes/Nutrition: Monitor I/O. Check CMET in am.  8.HTN: Monitor BP TID- continue amlodipine  and hydralazine .  8/9 increase hydralazine  to 37.5mg  BID 8/10 reports patient had been advised to take amlodipine  at night, medication timing adjusted.  She was also taking nebivolol .  Will hold off on adding this today as we are changing amlodipine  timing.  If BP still elevated tomorrow consider adding low-dose  Nebivolol  8/12 will restart Nebivolol  at 2.5 mg  8/13 increase Nebivolol  to 5mg  8/14 BP controlled doing better overall, continue current regimen and monitor  8/17: decrease amlodipine  to 5mg  given diastolic hypotension  - 8/18 fair control continue current regimen and monitor for now - 8/19 systolic BP intermittently elevated, diastolic a little low.  Will increase amlodipine  back to 10 mg but decrease Nebivolol  to 2.5 mg -8/20 systolic blood pressure remains elevated , increase flomax , consider increase Nebivolol  back to 5 mg, monitor HR  8/21 increase Nebivolol  back to 5 mg  8/22 fair BP control, continue to monitor trend  -8/25 increase hydralazine  to every 8 hours  -8/27 Increase hydralazine  to 50mg  Q8H  -8/28 Improved,  monitor response to medication change    06/27/2024    4:47 AM 06/26/2024    8:17 PM 06/26/2024    3:31 PM  Vitals with BMI  Systolic 149 143 844  Diastolic 53 86 52  Pulse 64 57 57    9. Anemia of chronic disease: Negative work up by Dr. Freddie and Dr Lanny last month.     - Appears to be gradually trending down on review on records. Check FOBT  -8/13 hemoglobin overall stable at 8.4, discussed FOBT nursing team  -8/14 hemoglobin remained stable at 8.4  -8/18 no FOBT completed yet but hemoglobin appears stable  -8/25 hemoglobin stable at 8.7  -8/28 stable GHB at 8.3   10. T2DM: Diet controlled- Monitor BS ac/hs.  Change CBGs to just daily  -06/22/24 still  getting CBGs 4x/d, will alert nursing staff. CBGs <200  -06/23/24 CBG this morning great at 118. Just monitor daily, SSI stopped  8/25 CBG stable  Glucose stable on BMP  CBG (last 3)  Recent Labs    06/26/24 0633 06/27/24 0618  GLUCAP 113* 118*    11. H/o UTIs/Urinary retention: Question due to Merbetriq--d/c a couple of days ago?  Appears this.  For urinary retention.  Foley replaced. Will remove and start bladder program             -monitor voiding with PVR/bladder scan.   -Will ask nursing to get some PVRs 8/14 I see 1 bladder scan yesterday for  433 mL, unsure if this is before or after she voided.  Will asked nursing to document PVRs 8/15 bladder scan of 270, looks like she had to void a few minutes prior.  She is currently on Flomax .  Will asked nursing to check a few more.  If continues consider Urecholine  8/18 will add additional nursing order regarding PVR/bladder scans, do not see any additional ones completed -8/20 PVRs remain mildly elevated, increase flomax  to 0.8mg , she is declining IC -8/22 Urecholine  started yesterday, appears may be helping last PVR of 27, although prior to PVRs were in the low 200s.  Will increase Urecholine  to 10 mg 3 times daily -06/22/24 not many PVRs, but all fairly low, monitor -8/25 patient had IC last night, potentially increased confusion this morning although may be due to poor sleep last night.  Will check UA- indicates UTI, start keflex  -8/26 Cultures pending, last PVR 0 -8/27 Abx changed to amoxicillin  250mg  for 5 days -8/28 Will increase urocholine 20mg  TID  12. Dementia: Continue Donepezil .   13. Constipation: Had enema yesterday after no BM X one week - 8/10 lactulose  changed from daily to as needed, had a large bowel movement yesterday  8/11 last bm today. Pt/daughter satisfied with current regimen  8/13 LBM documented 6/11, will  add prn Miralax  for mild constipation, encourage oral fluid intake  8/20 Miralax  34g , LBM 8/17  8/22 no  BM yesterday continue to monitor  -06/23/24 LBM yesterday morning, cont regimen  -8/28 LBM yesterday  14.  CKD 3b: Monitor with serial checks as well as UOP/voiding   - 8/9 creatinine 1.39, overall stable continue to monitor  8/11-a slight bump in BUN/Cr today (20/1.52).  I'm not too concerned at present. WIll encourage PO and follow up lab work on Wednesday.  8/14 Cr little higher at 1.58/BUN 25, still around overall baseline, continue encourage oral fluids again today 8/15 discussed oral fluid intake, continue to monitor labs.  If increases further consider some short-term IV fluids  8/16 Cr reviewed and is mildly improved  8/18 creatinine a little higher, will start some gentle IVF 8/20 BUN 24, Cr down to 1.42, recheck Friday, DC IVF. Encourage oral fluids 8/22 creatinine and BUN a little higher today at 1.5/28, continue to encourage oral fluids 8/25 IV fluids 75 mL per hour as creatinine up to 1.79/BUN 33.  Appears she continues to have poor p.o. intake 8/26 IVF changed to 20ml/hr, recheck tomorrow  8/27 Cr Trending down to 1.65 increase to 75ml /hr recheck tomorrow 8/27 Cr still trending down, continue IV fluids for now  15. Obesity. Class 2. Dietary counseling   -Body mass index is 34.11 kg/m.     LOS: 20 days A FACE TO FACE EVALUATION WAS PERFORMED  Murray Collier 06/27/2024, 12:05 PM

## 2024-06-28 ENCOUNTER — Other Ambulatory Visit (HOSPITAL_BASED_OUTPATIENT_CLINIC_OR_DEPARTMENT_OTHER): Payer: Self-pay

## 2024-06-28 ENCOUNTER — Encounter: Payer: Self-pay | Admitting: Internal Medicine

## 2024-06-28 LAB — BASIC METABOLIC PANEL WITH GFR
Anion gap: 10 (ref 5–15)
BUN: 30 mg/dL — ABNORMAL HIGH (ref 8–23)
CO2: 20 mmol/L — ABNORMAL LOW (ref 22–32)
Calcium: 8 mg/dL — ABNORMAL LOW (ref 8.9–10.3)
Chloride: 108 mmol/L (ref 98–111)
Creatinine, Ser: 1.4 mg/dL — ABNORMAL HIGH (ref 0.44–1.00)
GFR, Estimated: 36 mL/min — ABNORMAL LOW (ref >60)
Glucose, Bld: 120 mg/dL — ABNORMAL HIGH (ref 70–99)
Potassium: 3.7 mmol/L (ref 3.5–5.1)
Sodium: 138 mmol/L (ref 135–145)

## 2024-06-28 LAB — GLUCOSE, CAPILLARY: Glucose-Capillary: 123 mg/dL — ABNORMAL HIGH (ref 70–99)

## 2024-06-28 MED ORDER — AMLODIPINE BESYLATE 10 MG PO TABS
10.0000 mg | ORAL_TABLET | Freq: Every day | ORAL | 0 refills | Status: AC
  Filled 2024-06-28 – 2024-08-24 (×2): qty 30, 30d supply, fill #0

## 2024-06-28 MED ORDER — ACETAMINOPHEN 325 MG PO TABS
650.0000 mg | ORAL_TABLET | Freq: Every day | ORAL | 0 refills | Status: DC
Start: 2024-06-29 — End: 2024-06-28
  Filled 2024-06-28: qty 100, 50d supply, fill #0

## 2024-06-28 MED ORDER — POLYSACCHARIDE IRON COMPLEX 150 MG PO CAPS
150.0000 mg | ORAL_CAPSULE | Freq: Every day | ORAL | 0 refills | Status: AC
Start: 2024-06-28 — End: ?
  Filled 2024-06-28 – 2024-07-02 (×3): qty 30, 30d supply, fill #0

## 2024-06-28 MED ORDER — LINACLOTIDE 145 MCG PO CAPS: 145.0000 ug | ORAL_CAPSULE | Freq: Every day | ORAL | Status: AC | PRN

## 2024-06-28 MED ORDER — LIDOCAINE 5 % EX PTCH
1.0000 | MEDICATED_PATCH | CUTANEOUS | 0 refills | Status: DC
  Filled 2024-06-28: qty 30, 30d supply, fill #0

## 2024-06-28 MED ORDER — AMOXICILLIN 250 MG PO CHEW
250.0000 mg | CHEWABLE_TABLET | Freq: Two times a day (BID) | ORAL | 0 refills | Status: DC
Start: 2024-06-28 — End: 2024-07-15
  Filled 2024-06-28: qty 6, 3d supply, fill #0

## 2024-06-28 MED ORDER — HYDRALAZINE HCL 50 MG PO TABS: 62.5000 mg | ORAL_TABLET | Freq: Three times a day (TID) | ORAL | Status: DC

## 2024-06-28 MED ORDER — HYDRALAZINE HCL 50 MG PO TABS
50.0000 mg | ORAL_TABLET | Freq: Four times a day (QID) | ORAL | Status: DC
  Administered 2024-06-28 – 2024-06-29 (×3): 50 mg via ORAL
  Filled 2024-06-28 (×3): qty 1

## 2024-06-28 MED ORDER — DOCUSATE SODIUM 100 MG PO CAPS
100.0000 mg | ORAL_CAPSULE | Freq: Two times a day (BID) | ORAL | 0 refills | Status: AC
  Filled 2024-06-28: qty 60, 30d supply, fill #0

## 2024-06-28 MED ORDER — HYDRALAZINE HCL 50 MG PO TABS
50.0000 mg | ORAL_TABLET | Freq: Four times a day (QID) | ORAL | 0 refills | Status: AC
  Filled 2024-06-28: qty 120, 30d supply, fill #0

## 2024-06-28 MED ORDER — BETHANECHOL CHLORIDE 10 MG PO TABS
20.0000 mg | ORAL_TABLET | Freq: Three times a day (TID) | ORAL | 0 refills | Status: DC
  Filled 2024-06-28: qty 180, 30d supply, fill #0

## 2024-06-28 MED ORDER — TAMSULOSIN HCL 0.4 MG PO CAPS
0.8000 mg | ORAL_CAPSULE | Freq: Every day | ORAL | 0 refills | Status: DC
  Filled 2024-06-28 – 2024-06-29 (×2): qty 60, 30d supply, fill #0

## 2024-06-28 NOTE — Progress Notes (Signed)
 Inpatient Rehabilitation Care Coordinator Discharge Note DC 8/30  Patient Details  Name: [name] Mathews MRN: [id number] Date of Birth: 24-Sep-1934   Discharge location: HOME WITH DAUGHTER AND HIRED COMPANION AWARE OF CARE NEEDS  Length of Stay: 22 DAYS  Discharge activity level: CGA-SUPERVISION LEVEL  Home/community participation: ACTIVE  Patient response un:Yzjouy Literacy - How often do you need to have someone help you when you read instructions, pamphlets, or other written material from your doctor or pharmacy?: Never  Patient response un:Dnrpjo Isolation - How often do you feel lonely or isolated from those around you?: Rarely  Services provided included: MD, RD, PT, OT, RN, CM, TR, Pharmacy, SW  Financial Services:  Financial Services Utilized: Medicare    Choices offered to/list presented to: DAUGHTER  Follow-up services arranged:  Home Health, Patient/Family request agency HH/DME, DME Home Health Agency: [facility]  PT &  OT    DME : [facility]-UNSURE IF CAN GET ONE-CHAIR HAS BEEN DELIVERED TO HER ROOM AND DAUGHTER WILL TAKE HOME HH/DME Requested Agency: [facility]  Patient response to transportation need: Is the patient able to respond to transportation needs?: Yes In the past 12 months, has lack of transportation kept you from medical appointments or from getting medications?: No In the past 12 months, has lack of transportation kept you from meetings, work, or from getting things needed for daily living?: No   Patient/Family verbalized understanding of follow-up arrangements:  Yes  Individual responsible for coordination of the follow-up plan: [name] Mathews-DAUGHTER [phone]  Confirmed correct DME delivered: [name] Mathews 06/28/2024    Comments (or additional information): PT DID WELL AND MADE GOOD PROGRESS HERE. DAUGHTER WAS HERE FOR HANDS ON EDUCATION AND AWARE OF MOM'S CARE NEEDS.  Summary of Stay    Date/Time  Discharge Planning CSW  06/26/24 [phone] Daughter to be here for family education today and have ordered DME. HH set up via [facility]. daughter aware will need CGA level of assist RGD  06/19/24 0941 Continuing to make progress in therapies and is working on steps with PT. Daughter stays the night and then goes home and comes back to see mom in the afternoon. Aware of her progress and pleased with how well she is doing. RGD  06/12/24 0955 Home with daughter who is retired, pt has a companion 8 hours per day to assist. Main issue is the 14 steps they have to go to second floor-7-7. Daughter hopeful will be able to go up and down the stairs at DC RGD       [name] Mathews, [name] Mathews

## 2024-06-28 NOTE — Progress Notes (Signed)
 Occupational Therapy Session Note  Patient Details  Name: [name] Mathews MRN: [id number] Date of Birth: 09-25-1934  Today's Date: 06/28/2024 OT Individual Time: 8950-8794 OT Individual Time Calculation (min): 76 min    Short Term Goals: Week 3:  OT Short Term Goal 1 (Week 3): STG=LTG d/t ELOS  Skilled Therapeutic Interventions/Progress Updates:    Patient agreeable to participate in OT session. Reports no pain level.   Patient participated in skilled OT session focusing on diwscharge planning, completing toileting, functional mobility, bladder scan with nursing. Patient received in recliner.patient able to complete sit to stand SUP. Completed toileting  trans SUP. Toileting 3/3 with increased time SUP. Patient and nursing completed bladder scan to ensure empty bladder with voiding. Patient completed functional mobility 300+ feet with SUP to CGA with several self reported needs for rest to increase activity tolerance and safety. Completed patient education on need for safety. Left with alarm on in bed all needs in reach    Therapy Documentation Precautions:  Precautions Precautions: Fall Recall of Precautions/Restrictions: Intact Restrictions Weight Bearing Restrictions Per Provider Order: No  Therapy/Group: Individual Therapy  D'mariea L Luane Rochon 06/28/2024, 7:23 AM

## 2024-06-28 NOTE — Progress Notes (Addendum)
 PROGRESS NOTE   Subjective/Complaints:  No new complaints or concerns today.  Looking forward to going home tomorrow.  ROS: as per HPI. Denies Chills, CP, SOB, abd pain, N/V/D/C, or any other complaints at this time.   +Rt hip pain -remains improved + Urinary retention- continued, but not requiring frequent IC Denies dysuria   Objective:   No results found.  Recent Labs    06/26/24 0433 06/27/24 0505  WBC 6.4 5.9  HGB 8.7* 8.3*  HCT 26.2* 25.2*  PLT 193 180    Recent Labs    06/27/24 0505 06/28/24 0436  NA 138 138  K 3.9 3.7  CL 108 108  CO2 21* 20*  GLUCOSE 118* 120*  BUN 32* 30*  CREATININE 1.57* 1.40*  CALCIUM  8.3* 8.0*    Intake/Output Summary (Last 24 hours) at 06/28/2024 1337 Last data filed at 06/28/2024 0800 Gross per 24 hour  Intake 4063.21 ml  Output 10 ml  Net 4053.21 ml         Physical Exam: Vital Signs Blood pressure (!) 149/57, pulse 66, temperature 98.4 F (36.9 C), resp. rate 20, height 5' 7 (1.702 m), weight 98.8 kg, SpO2 99%.  General: No acute distress, sitting in bedside chair, appears comfortable HEENT: NCAT, EOMI, oral membranes moist this morning Cards: RRR  no m/r/g Chest: Clear to auscultation bilaterally, nonlabored breathing  abdomen: Soft, NT, ND, + BS normal active Skin: No breakdown noted on visible portion of skin Extremities:  trace b/l edema Psych: pleasant and appropriate  MSK:      No apparent deformity.  No significant greater trochanter tenderness today       Neurologic exam: Alert awake, cranial nerves II through XII grossly intact, fluent, sensation intact light touch in all 4 extremities  Strength 4+/5 in all 4 extremities  Prior neuro assessment is c/w today's exam 06/28/2024.   Assessment/Plan: 1. Functional deficits which require 3+ hours per day of interdisciplinary therapy in a comprehensive inpatient rehab setting. Physiatrist is providing  close team supervision and 24 hour management of active medical problems listed below. Physiatrist and rehab team continue to assess barriers to discharge/monitor patient progress toward functional and medical goals  Care Tool:  Bathing    Body parts bathed by patient: Right arm, Left arm, Chest, Abdomen, Right upper leg, Left upper leg, Right lower leg, Left lower leg, Face, Front perineal area, Buttocks     Body parts n/a: Buttocks, Front perineal area, Right lower leg, Left lower leg   Bathing assist Assist Level: Contact Guard/Touching assist     Upper Body Dressing/Undressing Upper body dressing   What is the patient wearing?: Pull over shirt    Upper body assist Assist Level: Set up assist    Lower Body Dressing/Undressing Lower body dressing      What is the patient wearing?: Underwear/pull up, Pants     Lower body assist Assist for lower body dressing: Supervision/Verbal cueing     Toileting Toileting    Toileting assist Assist for toileting: Contact Guard/Touching assist     Transfers Chair/bed transfer  Transfers assist  Chair/bed transfer activity did not occur: Safety/medical concerns  Chair/bed transfer assist level:  Supervision/Verbal cueing     Locomotion Ambulation   Ambulation assist      Assist level: Contact Guard/Touching assist Assistive device: Walker-rolling Max distance: 156   Walk 10 feet activity   Assist     Assist level: Contact Guard/Touching assist Assistive device: Walker-rolling   Walk 50 feet activity   Assist Walk 50 feet with 2 turns activity did not occur: Safety/medical concerns  Assist level: Contact Guard/Touching assist Assistive device: Walker-rolling    Walk 150 feet activity   Assist Walk 150 feet activity did not occur: Safety/medical concerns (fatigue)  Assist level: Contact Guard/Touching assist Assistive device: Walker-rolling    Walk 10 feet on uneven surface  activity   Assist  Walk 10 feet on uneven surfaces activity did not occur: Safety/medical concerns   Assist level: Minimal Assistance - Patient > 75% Assistive device: Walker-rolling   Wheelchair     Assist Is the patient using a wheelchair?: No Type of Wheelchair: Manual    Wheelchair assist level: Dependent - Patient 0%      Wheelchair 50 feet with 2 turns activity    Assist        Assist Level: Dependent - Patient 0%   Wheelchair 150 feet activity     Assist      Assist Level: Dependent - Patient 0%   Blood pressure (!) 149/57, pulse 66, temperature 98.4 F (36.9 C), resp. rate 20, height 5' 7 (1.702 m), weight 98.8 kg, SpO2 99%.  Medical Problem List and Plan: 1. Functional deficits secondary to debility s/p UTI             -patient may shower             -ELOS/Goals: 21-24 days, Min A PT/OT             -Continue CIR therapies including PT, OT    - Expected discharge tomorrow  2.  Antithrombotics: -DVT/anticoagulation:  Pharmaceutical: continue Lovenox  30mg  daily             -antiplatelet therapy: N/A  3. Pain Management: continue Tylenol  prn.  -8/13 patient like to avoid stronger pain medications.  Having some right hip pain after stairs.  Will try Tylenol  and heating pad for now. 8/14 hip pain doing a little better, continue current regimen for now, we will see how she does with therapy 8/15 discussed scheduling some Tylenol  before therapy, patient declines for now.  She reports overall controlled  8/18 will schedule Tylenol  in the morning, patient agreeable  8/19 Xray R hip- B/L hip OA, lidocaine  patch  8/21-22 pain overall under control, continue to monitor  8/26-29 Pain reported to be controlled, continue current regimen   4. Mood/Behavior/Sleep: LCSW to follow for evaluation and support.              -antipsychotic agents: N/A 5. Neuropsych/cognition: This patient is not fully capable of making decisions on her own behalf. 6. Skin/Wound Care: Routine  pressure relief meausre.  7. Fluids/Electrolytes/Nutrition: Monitor I/O. Check CMET in am.  8.HTN: Monitor BP TID- continue amlodipine  and hydralazine .  8/9 increase hydralazine  to 37.5mg  BID 8/10 reports patient had been advised to take amlodipine  at night, medication timing adjusted.  She was also taking nebivolol .  Will hold off on adding this today as we are changing amlodipine  timing.  If BP still elevated tomorrow consider adding low-dose Nebivolol  8/12 will restart Nebivolol  at 2.5 mg  8/13 increase Nebivolol  to 5mg  8/14 BP controlled doing better overall, continue current  regimen and monitor  8/17: decrease amlodipine  to 5mg  given diastolic hypotension  - 8/18 fair control continue current regimen and monitor for now - 8/19 systolic BP intermittently elevated, diastolic a little low.  Will increase amlodipine  back to 10 mg but decrease Nebivolol  to 2.5 mg -8/20 systolic blood pressure remains elevated , increase flomax , consider increase Nebivolol  back to 5 mg, monitor HR  8/21 increase Nebivolol  back to 5 mg  8/22 fair BP control, continue to monitor trend  -8/25 increase hydralazine  to every 8 hours  -8/27 Increase hydralazine  to 50mg  Q8H  -8/29 will increase hydralazine  to 50 Q6h    06/28/2024    5:05 AM 06/27/2024    7:33 PM 06/27/2024    2:54 PM  Vitals with BMI  Systolic 149 150 841  Diastolic 57 60 52  Pulse 66 68     9. Anemia of chronic disease: Negative work up by Dr. Freddie and Dr Lanny last month.     - Appears to be gradually trending down on review on records. Check FOBT  -8/13 hemoglobin overall stable at 8.4, discussed FOBT nursing team  -8/14 hemoglobin remained stable at 8.4  -8/18 no FOBT completed yet but hemoglobin appears stable  -8/25 hemoglobin stable at 8.7  -8/28 stable GHB at 8.3   10. T2DM: Diet controlled- Monitor BS ac/hs.  Change CBGs to just daily  -06/22/24 still getting CBGs 4x/d, will alert nursing staff. CBGs <200  -06/23/24 CBG this  morning great at 118. Just monitor daily, SSI stopped  8/25 CBG stable  CBG stable, continue current  CBG (last 3)  Recent Labs    06/26/24 0633 06/27/24 0618 06/28/24 0606  GLUCAP 113* 118* 123*    11. H/o UTIs/Urinary retention: Question due to Merbetriq--d/c a couple of days ago?  Appears this.  For urinary retention.  Foley replaced. Will remove and start bladder program             -monitor voiding with PVR/bladder scan.   -Will ask nursing to get some PVRs 8/14 I see 1 bladder scan yesterday for  433 mL, unsure if this is before or after she voided.  Will asked nursing to document PVRs 8/15 bladder scan of 270, looks like she had to void a few minutes prior.  She is currently on Flomax .  Will asked nursing to check a few more.  If continues consider Urecholine  8/18 will add additional nursing order regarding PVR/bladder scans, do not see any additional ones completed -8/20 PVRs remain mildly elevated, increase flomax  to 0.8mg , she is declining IC -8/22 Urecholine  started yesterday, appears may be helping last PVR of 27, although prior to PVRs were in the low 200s.  Will increase Urecholine  to 10 mg 3 times daily -06/22/24 not many PVRs, but all fairly low, monitor -8/25 patient had IC last night, potentially increased confusion this morning although may be due to poor sleep last night.  Will check UA- indicates UTI, start keflex  -8/26 Cultures pending, last PVR 0 -8/27 Abx changed to amoxicillin  250mg  for 7 days -8/28 Will increase urocholine 20mg  TID PVRs intermittently elevated, follow-up with urology outpatient, continue antibiotics  12. Dementia: Continue Donepezil .   13. Constipation: Had enema yesterday after no BM X one week - 8/10 lactulose  changed from daily to as needed, had a large bowel movement yesterday  8/11 last bm today. Pt/daughter satisfied with current regimen  8/13 LBM documented 6/11, will add prn Miralax  for mild constipation, encourage oral fluid  intake  8/20 Miralax  34g , LBM 8/17  8/22 no BM yesterday continue to monitor  -06/23/24 LBM yesterday morning, cont regimen  -8/28 LBM yesterday  14.  CKD 3b: Monitor with serial checks as well as UOP/voiding   - 8/9 creatinine 1.39, overall stable continue to monitor  8/11-a slight bump in BUN/Cr today (20/1.52).  I'm not too concerned at present. WIll encourage PO and follow up lab work on Wednesday.  8/14 Cr little higher at 1.58/BUN 25, still around overall baseline, continue encourage oral fluids again today 8/15 discussed oral fluid intake, continue to monitor labs.  If increases further consider some short-term IV fluids  8/16 Cr reviewed and is mildly improved  8/18 creatinine a little higher, will start some gentle IVF 8/20 BUN 24, Cr down to 1.42, recheck Friday, DC IVF. Encourage oral fluids 8/22 creatinine and BUN a little higher today at 1.5/28, continue to encourage oral fluids 8/25 IV fluids 75 mL per hour as creatinine up to 1.79/BUN 33.  Appears she continues to have poor p.o. intake 8/26 IVF changed to 68ml/hr, recheck tomorrow  8/27 Cr Trending down to 1.65 increase to 75ml /hr recheck tomorrow 8/27 Cr still trending down, continue IV fluids for now 8/28 DC IV fluids, creatinine improved today 15. Obesity. Class 2. Dietary counseling   -Body mass index is 34.11 kg/m.     LOS: 21 days A FACE TO FACE EVALUATION WAS PERFORMED  Murray Collier 06/28/2024, 1:37 PM

## 2024-06-28 NOTE — Progress Notes (Signed)
 Occupational Therapy Discharge Summary  Patient Details  Name: [name] Mathews MRN: [id number] Date of Birth: [birth year]  Date of Discharge from OT service:June 28, 2024   Patient has met 9 of 9 long term goals due to improved activity tolerance, improved balance, postural control, and improved awareness.  Patient to discharge at overall Supervision level.  Patient's care partner is independent to provide the necessary physical and cognitive assistance at discharge.    Reasons goals not met: N/A  Recommendation:  Patient will benefit from ongoing skilled OT services in home health setting to continue to advance functional skills in the area of BADL.  Equipment: Transport chair, family to provide rollator   Reasons for discharge: treatment goals met and discharge from hospital  Patient/family agrees with progress made and goals achieved: Yes  OT Discharge Precautions/Restrictions  Precautions Precautions: Fall Recall of Precautions/Restrictions: Intact Restrictions Weight Bearing Restrictions Per Provider Order: No General   Vital Signs   Pain Pain Assessment Pain Scale: 0-10 Pain Score: 0-No pain ADL ADL Equipment Provided: Reacher Eating: Modified independent Where Assessed-Eating: Chair Grooming: Modified independent Where Assessed-Grooming: Wheelchair Upper Body Bathing: Setup Where Assessed-Upper Body Bathing: Shower Lower Body Bathing: Contact guard Where Assessed-Lower Body Bathing: Shower Upper Body Dressing: Setup Where Assessed-Upper Body Dressing: Standing at sink, Sitting at sink Lower Body Dressing: Supervision/safety Where Assessed-Lower Body Dressing: Standing at sink, Sitting at sink Toileting: Supervision/safety Where Assessed-Toileting: Bedside Commode Toilet Transfer: Close supervision Toilet Transfer Method: Proofreader: Grab bars, Raised toilet seat Tub/Shower Transfer: Close supervison Tub/Shower Transfer Method:  Ship broker: Information systems manager with back Film/video editor: Close supervision Film/video editor Method: Designer, industrial/product: Information systems manager with back Vision Baseline Vision/History: 1 Wears glasses Patient Visual Report: No change from baseline Vision Assessment?: No apparent visual deficits Perception  Perception: Within Functional Limits Praxis Praxis: WFL Cognition Cognition Overall Cognitive Status: History of cognitive impairments - at baseline Arousal/Alertness: Awake/alert Orientation Level: Person;Place;Situation Person: Oriented Place: Oriented Situation: Oriented Memory: Impaired Attention: Sustained Sustained Attention: Impaired Awareness: Appears intact Problem Solving: Impaired Organizing: Impaired Brief Interview for Mental Status (BIMS) Repetition of Three Words (First Attempt): 3 Temporal Orientation: Year: Correct Temporal Orientation: Month: Accurate within 5 days Temporal Orientation: Day: Incorrect Recall: Sock: Yes, no cue required Recall: Blue: Yes, no cue required Recall: Bed: Yes, no cue required BIMS Summary Score: 14 Sensation Sensation Light Touch: Appears Intact Hot/Cold: Not tested Proprioception: Not tested Stereognosis: Not tested Additional Comments: generalized debility Coordination Gross Motor Movements are Fluid and Coordinated: Yes Fine Motor Movements are Fluid and Coordinated: No Finger Nose Finger Test: WFL increased time Motor  Motor Motor: Within Functional Limits Motor - Skilled Clinical Observations: decreased smoothness and accuracy secondary to debility and global strength deficits Mobility  Bed Mobility Bed Mobility: Supine to Sit;Sit to Supine;Rolling Left;Rolling Right Rolling Right: Independent with assistive device Rolling Left: Independent with assistive device Supine to Sit: Independent with assistive device Sit to Supine: Independent with assistive  device Transfers Sit to Stand: Supervision/Verbal cueing Stand to Sit: Supervision/Verbal cueing  Trunk/Postural Assessment  Cervical Assessment Cervical Assessment: Within Functional Limits Thoracic Assessment Thoracic Assessment: Exceptions to [facility] (kyphosis) Lumbar Assessment Lumbar Assessment: Exceptions to [facility] (posterior pelvic tilt) Postural Control Postural Control: Deficits on evaluation Righting Reactions: delayed Protective Responses: delayed Postural Limitations: decreased  Balance Balance Balance Assessed: Yes Standardized Balance Assessment Standardized Balance Assessment: Timed Up and Go Test Timed Up and Go Test TUG: Normal TUG Normal TUG (seconds): 40  Static Sitting Balance Static Sitting - Level of Assistance: 6: Modified independent (Device/Increase time) Dynamic Sitting Balance Dynamic Sitting - Balance Support: Feet supported Dynamic Sitting - Level of Assistance: 5: Stand by assistance Static Standing Balance Static Standing - Balance Support: Bilateral upper extremity supported;During functional activity Static Standing - Level of Assistance: 5: Stand by assistance Dynamic Standing Balance Dynamic Standing - Balance Support: Bilateral upper extremity supported;During functional activity Dynamic Standing - Level of Assistance: 5: Stand by assistance Extremity/Trunk Assessment RUE Assessment RUE Assessment: Within Functional Limits Active Range of Motion (AROM) Comments: WFL General Strength Comments: 4/5 LUE Assessment LUE Assessment: Within Functional Limits Active Range of Motion (AROM) Comments: WFL General Strength Comments: 4/5   D'mariea L Rayvn Rickerson 06/28/2024, 11:05 AM

## 2024-06-28 NOTE — Progress Notes (Signed)
 Physical Therapy Session Note  Patient Details  Name: [name] Mathews MRN: [id number] Date of Birth: 09-Jun-1934  Today's Date: 06/28/2024 PT Individual Time: 9169-9054, [id number] PT Individual Time Calculation (min): 75 min, 29 min   Short Term Goals: Week 3:  PT Short Term Goal 1 (Week 3): STG=LTG 2/2 ELOS  Skilled Therapeutic Interventions/Progress Updates:   AM Session:  Pt upright in recliner and agreeable to therapy. Pt w/ no c/o pain throughout session. Pt required mod encouragement to participate in therapy today. Pt dtr present at session. Time spent discussing DC plan, readiness for DC, and plan for PM session to practice car transfer.   Pt completed TUG and :  8/11 TUG: 14sec 8/29 TUG: 40sec : 13sec  Pt demonstrated decreased step length, believed to be baseline, using rollator and supervision assist, ambulating ~235ft to improve functional activity tolerance. Pt given increased rest break time for recovery between outcome measures and bouts of gait. Pt self-propelled manual WC 53ft before being wheeled back to room for time. Pt returned to recliner w/ supervision assist on ambulatory transfer. Call bell given and all needs within reach.   Session PM:   Pt received supine in bed, agreeable to therapy w/ max encouragement. Pt w/ no c/o pain throughout session. Pt was supervision for all bed mobility and transfers. Pt required verbal and visual cues at times to stay close to rollator and when to put on/take off brakes. Pt ambulated >247ft w/ supervision assist and rollator, down to ortho gym w/ 1 seated rest break and 1 standing rest break. Pt performed car transfer w/ supervision assist, multiple verbal and visual cues for positioning of rollator. Pt dtr educated on proper guarding and use of rollator to minimize fall risk w/ good carryover. Pt able to ambulate from ortho gym back to room w/ no rest breaks! At end of session, care handed off to pt dtr who has  undergone training to perform toileting and complete the ambulatory transfer to bed.   Therapy Documentation Precautions:  Precautions Precautions: Fall Recall of Precautions/Restrictions: Intact Restrictions Weight Bearing Restrictions Per Provider Order: No  Balance: Standardized Balance Assessment Standardized Balance Assessment: Timed Up and Go Test Timed Up and Go Test TUG: Normal TUG Normal TUG (seconds): 40 : 13 seconds   Therapy/Group: Individual Therapy  [name] Mathews 06/28/2024, 9:48 AM

## 2024-06-28 NOTE — Discharge Summary (Signed)
 Physician Discharge Summary  Patient ID: [name] Mathews MRN: [id number] DOB/AGE: 08-Apr-1934 89 y.o.  Admit date: 06/07/2024 Discharge date: 06/29/2024  Discharge Diagnoses:  Principal Problem:   Debility Active Problems:   High blood pressure   CKD (chronic kidney disease), stage II   Type 2 diabetes mellitus with obesity (HCC)   UTI (urinary tract infection)   OA (osteoarthritis) of hip   Dementia (HCC)   Anemia in chronic kidney disease   Discharged Condition: stable  Significant Diagnostic Studies: DG HIP UNILAT WITH PELVIS 2-3 VIEWS RIGHT Result Date: 06/18/2024 CLINICAL DATA:  Right hip pain EXAM: DG HIP (WITH OR WITHOUT PELVIS) 2-3V RIGHT COMPARISON:  06/30/2009 FINDINGS: Frontal view of the pelvis as well as frontal and cross-table lateral views of the right hip are obtained. The cross-table lateral views are limited due to technique and body habitus. No evidence of acute displaced fracture, subluxation, or dislocation. Symmetrical bilateral hip osteoarthritis. Sacroiliac joints are unremarkable. Postsurgical changes lower lumbar spine. IMPRESSION: 1. No acute displaced fracture. 2. Symmetrical bilateral hip osteoarthritis. Electronically Signed   By: Ozell Daring M.D.   On: 06/18/2024 17:34    Labs:  Basic Metabolic Panel: Recent Labs  Lab 06/24/24 0504 06/26/24 0433 06/27/24 0505 06/28/24 0436  NA 137 136 138 138  K 4.1 4.2 3.9 3.7  CL 109 107 108 108  CO2 21* 23 21* 20*  GLUCOSE 127* 118* 118* 120*  BUN 33* 32* 32* 30*  CREATININE 1.79* 1.65* 1.57* 1.40*  CALCIUM  8.3* 8.3* 8.3* 8.0*    CBC: Recent Labs  Lab 06/24/24 0504 06/26/24 0433 06/27/24 0505  WBC 6.1 6.4 5.9  HGB 8.7* 8.7* 8.3*  HCT 26.5* 26.2* 25.2*  MCV 89.5 89.4 89.7  PLT 190 193 180    CBG: Recent Labs  Lab 06/23/24 0535 06/24/24 0535 06/26/24 0633 06/27/24 0618 06/28/24 0606  GLUCAP 118* 123* 113* 118* 123*    Brief HPI:   [name] Mathews is a 89 y.o. female with history of  T2DM-diet controlled, chronic LBP, HTN, CKD 4, OAB, mild dementia, anemia, UTI treated recently but without improvement and was admitted to [facility] with weakness and inability to get out of bed. She was treated with rocephin  briefly, foley placed for urinary retention and myrtetriq discontinued. Therapy was consulted due to deconditioned state and she was requiring min to mod assist for mobility with cues and needs rest breaks for ADL tasks. She had sitter PTA but was able to ambulate with supervision. CIR was recommended due to functional decline.    Hospital Course: [name] Mathews was admitted to rehab 06/07/2024 for inpatient therapies to consist of PT and OT at least three hours five days a week. Past admission physiatrist, therapy team and rehab RN have worked together to provide customized collaborative inpatient rehab. Her blood pressures were monitored on TID basis and noted to be labile.  Nevivolol was resumed and titrated to home dose. Hydralazine  was up titrated to 50 mg qid with recommendations to follow up with PCP for further adjustment after discharge. She continued to have issues with chronic constipation and lactulose  was added and changed to miralax  prn as symptoms improved. Acute on chronic renal failure has been monitored during her stay and fluids were encouraged without improvement. IVF added for gentle hydration X few days with improvement in SCr to 1.3-1.4 range. Patient/family educated on importance of fluids intake to maintain hydration as well as help with constipation issues. Follow up CBC showed H/H to  be stable on iron  supplement.   Her diabetes has been monitored with ac/hs CBG checks and SSI was use prn for tighter BS control.  BS are reasonably controlled and SSI was d/c on 08/24. Hip pain reported with increase in activity and X rays done revealed OA bilateral hips. Lidocaine  patch add to right hip for local measures, tylenol  scheduled daily in am to help with tolerance of  activity and therapy.   Foley removed past admission and voiding monitored with PVR checks. She was found to have urinary retention and flomax  added, titrated to 0.8 mg without improvement therefore urecholine  added with improvement in voiding and PVR < 300 cc. On 08/25, she was noted to have increase in confusion and found to have Enterococcus UTI. Antibiotics started and she is to continue amoxicillin  to complete  7 day course treatment. She was advised on increasing water intake and to toilet every 3-4 hours while awake.  Activity tolerance and endurance has improved. She has progressed to supervision level and she will continue to receive follow up HHPT and HHOT by [facility] after discharge.    Rehab course: During patient's stay in rehab weekly team conferences were held to monitor patient's progress, set goals and discuss barriers to discharge. At admission, patient required max assist with mobility and with basic self care tasks. ST evaluation revealed cognition at baseline and ST not needed during this stay. She  has had improvement in activity tolerance, balance, postural control as well as ability to compensate for deficits. She is able to complete ADL tasks with CGA to supervision. She requires supervision for transfers and to ambulate 150' with use of rollater and verbal cues.  Family education has been completed.    Discharge disposition: 06-Home-Health Care Svc  Diet: Carb Modified.   Special Instructions: Encourage fluid intake.  2. Follow up with urology for evaluation of recurrent UTI/retention.    Allergies as of 06/29/2024       Reactions   Sulfamethoxazole-trimethoprim Other (See Comments)   Decreased renal function   Telmisartan  Other (See Comments)   Severely elevated potassium and creatine   Januvia [sitagliptin] Other (See Comments)   Numbness in mouth   Metformin  Other (See Comments)   Decreased renal function   Pioglitazone  Swelling   edema    Simvastatin Other (See Comments)   tremors        Medication List     STOP taking these medications    BLACK CURRANT SEED OIL PO   cephALEXin  500 MG capsule Commonly known as: KEFLEX    ciprofloxacin  500 MG tablet Commonly known as: CIPRO    Ferrous Sulfate  50 MG Tbcr   Myrbetriq  50 MG Tb24 tablet Generic drug: mirabegron  ER       TAKE these medications    acetaminophen  325 MG tablet Commonly known as: TYLENOL  Take 2 tablets (650 mg total) by mouth every 4 (four) hours as needed for mild pain (pain score 1-3). What changed: reasons to take this   ALIGN PREBIOTIC-PROBIOTIC PO Take by mouth.   amLODipine  10 MG tablet Commonly known as: NORVASC  Take 1 tablet (10 mg total) by mouth daily after supper. What changed: when to take this   amoxicillin  250 MG chewable tablet Commonly known as: AMOXIL  Chew 1 tablet (250 mg total) by mouth every 12 (twelve) hours.   ascorbic acid  500 MG tablet Commonly known as: VITAMIN C  Take 500 mg by mouth daily.   aspirin  81 MG tablet Take 81 mg by mouth daily.  bethanechol  10 MG tablet Commonly known as: URECHOLINE  Take 2 tablets (20 mg total) by mouth 3 (three) times daily.   cyanocobalamin  1000 MCG tablet Commonly known as: VITAMIN B12 Take 1,000 mcg by mouth daily. OTC   denosumab  60 MG/ML Sosy injection Commonly known as: PROLIA  Inject 60 mg into the skin every 6 (six) months. Notes to patient: Resume after discharge.    docusate sodium  100 MG capsule Commonly known as: COLACE Take 1 capsule (100 mg total) by mouth 2 (two) times daily.   Ferrex 150 150 MG capsule Generic drug: iron  polysaccharides Take 1 capsule (150 mg total) by mouth daily with supper.   fluticasone  50 MCG/ACT nasal spray Commonly known as: FLONASE  Spray 2 sprays into each nostril every day once daily.   galantamine  8 MG 24 hr capsule Commonly known as: RAZADYNE  ER Take 1 capsule (8 mg total) by mouth every morning with food.    hydrALAZINE  50 MG tablet Commonly known as: APRESOLINE  Take 1 tablet (50 mg total) by mouth every 6 (six) hours. What changed:  medication strength how much to take when to take this   lidocaine  5 % Commonly known as: LIDODERM  Place 1 patch onto the skin daily. Remove & Discard patch within 12 hours or as directed by MD   Lidocaine -Hydrocort  (Perianal) 3-0.5 % Crea Apply to rectum 2 (two) times daily as needed externally.   linaclotide  145 MCG Caps capsule Commonly known as: Linzess  Take 1 capsule (145 mcg total) by mouth daily as needed. Per daughter, patient takes twice weekly as needed (days fluctuate from week to week) What changed:  when to take this reasons to take this additional instructions Notes to patient: Can use miralax  instead--use daily   Magnesium  200 MG Tabs Take 400 mg by mouth daily.   melatonin 3 MG Tabs tablet Take 1 tablet (3 mg total) by mouth at bedtime as needed.   montelukast  10 MG tablet Commonly known as: SINGULAIR  Take 1 tablet (10 mg total) by mouth daily.   multivitamin tablet Take 1 tablet by mouth every morning.   nebivolol  5 MG tablet Commonly known as: BYSTOLIC  Take 1 tablet (5 mg total) by mouth at bedtime.   OneTouch Delica Plus Lancet33G Misc Apply 1 each topically daily.   OneTouch Verio test strip Generic drug: glucose blood Use 1 test strip to check blood glucose once a day. What changed: Another medication with the same name was removed. Continue taking this medication, and follow the directions you see here.   PROBIOTIC DIGESTIVE SUPPORT PO Take 2 each by mouth every morning.   rosuvastatin  5 MG tablet Commonly known as: CRESTOR  Take 1 tablet (5 mg total) by mouth daily.   tamsulosin  0.4 MG Caps capsule Commonly known as: FLOMAX  Take 2 capsules (0.8 mg total) by mouth at bedtime. What changed: how much to take   zinc  sulfate (50mg  elemental zinc ) 220 (50 Zn) MG capsule Take 220 mg by mouth daily.         Follow-up Information     Shayne Anes, MD Follow up.   Specialty: Internal Medicine Why: Call in 1-2 days for post hospital follow up Contact information: [address] [place] [address] [phone]         Urbano Albright, MD. Call.   Specialty: Physical Medicine and Rehabilitation Why: As needed Contact information: [address] [place] [address] [phone]         Alvaro Ricardo KATHEE Mickey., MD Follow up.  Specialty: Urology Why: Keep appointment next week Contact information: [address] [place] [address] [phone]                 Signed: Sharlet GORMAN Schmitz 07/03/2024, 4:31 PM

## 2024-06-29 ENCOUNTER — Encounter: Payer: Self-pay | Admitting: Internal Medicine

## 2024-06-29 ENCOUNTER — Other Ambulatory Visit (HOSPITAL_BASED_OUTPATIENT_CLINIC_OR_DEPARTMENT_OTHER): Payer: Self-pay

## 2024-06-29 LAB — GLUCOSE, CAPILLARY: Glucose-Capillary: 122 mg/dL — ABNORMAL HIGH (ref 70–99)

## 2024-06-29 NOTE — Progress Notes (Signed)
 Inpatient Rehabilitation Discharge Medication Review by a Pharmacist  A complete drug regimen review was completed for this patient to identify any potential clinically significant medication issues.  High Risk Drug Classes Is patient taking? Indication by Medication  Antipsychotic No   Anticoagulant No   Antibiotic Yes Amoxicillin  - UTI  Opioid No   Antiplatelet Yes bASA - CAD ppx  Hypoglycemics/insulin  No   Vasoactive Medication Yes Amlodipine , hydralazine , nebivolol  - HTN Flomax  - incontinence  Chemotherapy No   Other Yes Acetaminophen , Lidoderm  - pain Lidocort  - rectal pain Vitamin C , zinc , Align, probiotic, Niferex, vitamin B12, magnesium , MVI - supplement Rosuvastatin  - HLD Flonase /Singulair  - allergy Galantamine  - dementia Lactulose , Linzess , docusate - constipation Melatonin - sleep Prolia  - osteoporosis  Bethanechol  - incontinence      Type of Medication Issue Identified Description of Issue Recommendation(s)  Drug Interaction(s) (clinically significant)     Duplicate Therapy     Allergy     No Medication Administration End Date     Incorrect Dose     Additional Drug Therapy Needed     Significant med changes from prior encounter (inform family/care partners about these prior to discharge). PTA medications stopped: mirabegron , pioglitazone   Communicate with patient/family of changes made to medications.  Other       Clinically significant medication issues were identified that warrant physician communication and completion of prescribed/recommended actions by midnight of the next day:  No  Name of provider notified for urgent issues identified:   Provider Method of Notification:     Pharmacist comments:   Time spent performing this drug regimen review (minutes):  20  Thank you for involving pharmacy in this patient's care.  Delon Sax, PharmD, BCPS Clinical Pharmacist Clinical phone for 06/29/2024 is [phone] 06/29/2024 8:55 AM

## 2024-06-29 NOTE — Progress Notes (Signed)
 PROGRESS NOTE   Subjective/Complaints:  Going home today! Doing well, slept well, denies pain, LBM 8/27 so discussed use of miralax  once home to try to get BMs more regular. Urinating fine, no pain or discomfort. No other complaints or concerns.   ROS: as per HPI. Denies Chills, CP, SOB, abd pain, N/V/D/C, or any other complaints at this time.   +Rt hip pain -remains improved + Urinary retention- continued, but not requiring frequent IC Denies dysuria   Objective:   No results found.  Recent Labs    06/27/24 0505  WBC 5.9  HGB 8.3*  HCT 25.2*  PLT 180    Recent Labs    06/27/24 0505 06/28/24 0436  NA 138 138  K 3.9 3.7  CL 108 108  CO2 21* 20*  GLUCOSE 118* 120*  BUN 32* 30*  CREATININE 1.57* 1.40*  CALCIUM  8.3* 8.0*   No intake or output data in the 24 hours ending 06/29/24 1244        Physical Exam: Vital Signs Blood pressure (!) 137/55, pulse 62, temperature 98.4 F (36.9 C), resp. rate 18, height 5' 7 (1.702 m), weight 98.8 kg, SpO2 96%.  General: No acute distress, resting in bed, appears comfortable HEENT: NCAT, EOMI, oral membranes moist this morning Cards: RRR  no m/r/g Chest: Clear to auscultation bilaterally, nonlabored breathing  abdomen: Soft, NT, ND, + BS normal active Skin: No breakdown noted on visible portion of skin Extremities:  trace b/l edema Psych: pleasant and appropriate  PRIOR EXAMS:  MSK:      No apparent deformity.  No significant greater trochanter tenderness today       Neurologic exam: Alert awake, cranial nerves II through XII grossly intact, fluent, sensation intact light touch in all 4 extremities  Strength 4+/5 in all 4 extremities    Assessment/Plan: 1. Functional deficits which require 3+ hours per day of interdisciplinary therapy in a comprehensive inpatient rehab setting. Physiatrist is providing close team supervision and 24 hour management of active  medical problems listed below. Physiatrist and rehab team continue to assess barriers to discharge/monitor patient progress toward functional and medical goals  Care Tool:  Bathing    Body parts bathed by patient: Right arm, Left arm, Chest, Abdomen, Right upper leg, Left upper leg, Right lower leg, Left lower leg, Face, Front perineal area, Buttocks     Body parts n/a: Buttocks, Front perineal area, Right lower leg, Left lower leg   Bathing assist Assist Level: Contact Guard/Touching assist     Upper Body Dressing/Undressing Upper body dressing   What is the patient wearing?: Pull over shirt    Upper body assist Assist Level: Set up assist    Lower Body Dressing/Undressing Lower body dressing      What is the patient wearing?: Underwear/pull up, Pants     Lower body assist Assist for lower body dressing: Supervision/Verbal cueing     Toileting Toileting    Toileting assist Assist for toileting: Contact Guard/Touching assist     Transfers Chair/bed transfer  Transfers assist  Chair/bed transfer activity did not occur: Safety/medical concerns  Chair/bed transfer assist level: Supervision/Verbal cueing     Locomotion Ambulation  Ambulation assist      Assist level: Contact Guard/Touching assist Assistive device: Walker-rolling Max distance: 156   Walk 10 feet activity   Assist     Assist level: Contact Guard/Touching assist Assistive device: Walker-rolling   Walk 50 feet activity   Assist Walk 50 feet with 2 turns activity did not occur: Safety/medical concerns  Assist level: Contact Guard/Touching assist Assistive device: Walker-rolling    Walk 150 feet activity   Assist Walk 150 feet activity did not occur: Safety/medical concerns (fatigue)  Assist level: Contact Guard/Touching assist Assistive device: Walker-rolling    Walk 10 feet on uneven surface  activity   Assist Walk 10 feet on uneven surfaces activity did not occur:  Safety/medical concerns   Assist level: Minimal Assistance - Patient > 75% Assistive device: Walker-rolling   Wheelchair     Assist Is the patient using a wheelchair?: No Type of Wheelchair: Manual    Wheelchair assist level: Dependent - Patient 0%      Wheelchair 50 feet with 2 turns activity    Assist        Assist Level: Dependent - Patient 0%   Wheelchair 150 feet activity     Assist      Assist Level: Dependent - Patient 0%   Blood pressure (!) 137/55, pulse 62, temperature 98.4 F (36.9 C), resp. rate 18, height 5' 7 (1.702 m), weight 98.8 kg, SpO2 96%.  Medical Problem List and Plan: 1. Functional deficits secondary to debility s/p UTI             -patient may shower             -ELOS/Goals: 21-24 days, Min A PT/OT             -Continue CIR therapies including PT, OT    - 06/29/24 d/c home today, meds reviewed  2.  Antithrombotics: -DVT/anticoagulation:  Pharmaceutical: continue Lovenox  30mg  daily             -antiplatelet therapy: N/A  3. Pain Management: continue Tylenol  prn.  -8/13 patient like to avoid stronger pain medications.  Having some right hip pain after stairs.  Will try Tylenol  and heating pad for now. 8/14 hip pain doing a little better, continue current regimen for now, we will see how she does with therapy 8/15 discussed scheduling some Tylenol  before therapy, patient declines for now.  She reports overall controlled  8/18 will schedule Tylenol  in the morning, patient agreeable  8/19 Xray R hip- B/L hip OA, lidocaine  patch  8/21-22 pain overall under control, continue to monitor  8/26-29 Pain reported to be controlled, continue current regimen   4. Mood/Behavior/Sleep: LCSW to follow for evaluation and support.              -antipsychotic agents: N/A 5. Neuropsych/cognition: This patient is not fully capable of making decisions on her own behalf. 6. Skin/Wound Care: Routine pressure relief meausre.  7.  Fluids/Electrolytes/Nutrition: Monitor I/O. Check CMET in am.  8.HTN: Monitor BP TID- continue amlodipine  and hydralazine .  8/9 increase hydralazine  to 37.5mg  BID 8/10 reports patient had been advised to take amlodipine  at night, medication timing adjusted.  She was also taking nebivolol .  Will hold off on adding this today as we are changing amlodipine  timing.  If BP still elevated tomorrow consider adding low-dose Nebivolol  8/12 will restart Nebivolol  at 2.5 mg  8/13 increase Nebivolol  to 5mg  8/14 BP controlled doing better overall, continue current regimen and monitor  8/17: decrease amlodipine   to 5mg  given diastolic hypotension  - 8/18 fair control continue current regimen and monitor for now - 8/19 systolic BP intermittently elevated, diastolic a little low.  Will increase amlodipine  back to 10 mg but decrease Nebivolol  to 2.5 mg -8/20 systolic blood pressure remains elevated , increase flomax , consider increase Nebivolol  back to 5 mg, monitor HR  8/21 increase Nebivolol  back to 5 mg  8/22 fair BP control, continue to monitor trend  -8/25 increase hydralazine  to every 8 hours  -8/27 Increase hydralazine  to 50mg  Q8H  -8/29 will increase hydralazine  to 50 Q6h  -06/29/24 BP stable, f/up outpatient Vitals:   06/27/24 0447 06/27/24 1413 06/27/24 1417 06/27/24 1454  BP: (!) 149/53 (!) 160/52 (!) 160/52 (!) 158/52   06/27/24 1933 06/28/24 0505 06/28/24 1338 06/28/24 1339  BP: (!) 150/60 (!) 149/57 (!) 167/55 (!) 167/55   06/28/24 1712 06/28/24 1919 06/29/24 0010 06/29/24 0527  BP: (!) 158/70 (!) 162/60 (!) 159/58 (!) 137/55      9. Anemia of chronic disease: Negative work up by Dr. Freddie and Dr Lanny last month.     - Appears to be gradually trending down on review on records. Check FOBT  -8/13 hemoglobin overall stable at 8.4, discussed FOBT nursing team  -8/14 hemoglobin remained stable at 8.4  -8/18 no FOBT completed yet but hemoglobin appears stable  -8/25 hemoglobin stable at  8.7  -8/28 stable Hgb at 8.3   10. T2DM: Diet controlled- Monitor BS ac/hs.  Change CBGs to just daily  -06/22/24 still getting CBGs 4x/d, will alert nursing staff. CBGs <200  -06/23/24 CBG this morning great at 118. Just monitor daily, SSI stopped  8/25 CBG stable  CBG stable, continue current  CBG (last 3)  Recent Labs    06/27/24 0618 06/28/24 0606 06/29/24 0649  GLUCAP 118* 123* 122*    11. H/o UTIs/Urinary retention: Question due to Merbetriq--d/c a couple of days ago?  Appears this.  For urinary retention.  Foley replaced. Will remove and start bladder program             -monitor voiding with PVR/bladder scan.   -Will ask nursing to get some PVRs 8/14 I see 1 bladder scan yesterday for  433 mL, unsure if this is before or after she voided.  Will asked nursing to document PVRs 8/15 bladder scan of 270, looks like she had to void a few minutes prior.  She is currently on Flomax .  Will asked nursing to check a few more.  If continues consider Urecholine  8/18 will add additional nursing order regarding PVR/bladder scans, do not see any additional ones completed -8/20 PVRs remain mildly elevated, increase flomax  to 0.8mg , she is declining IC -8/22 Urecholine  started yesterday, appears may be helping last PVR of 27, although prior to PVRs were in the low 200s.  Will increase Urecholine  to 10 mg 3 times daily -06/22/24 not many PVRs, but all fairly low, monitor -8/25 patient had IC last night, potentially increased confusion this morning although may be due to poor sleep last night.  Will check UA- indicates UTI, start keflex  -8/26 Cultures pending, last PVR 0 -8/27 Abx changed to amoxicillin  250mg  for 7 days -8/28 Will increase urocholine 20mg  TID PVRs intermittently elevated, follow-up with urology outpatient, continue antibiotics -06/29/24 no further retention last night, f/up outpatient  12. Dementia: Continue Donepezil .   13. Constipation: Had enema yesterday after no BM X one  week - 8/10 lactulose  changed from daily to as needed, had a  large bowel movement yesterday  8/11 last bm today. Pt/daughter satisfied with current regimen  8/13 LBM documented 6/11, will add prn Miralax  for mild constipation, encourage oral fluid intake  8/20 Miralax  34g , LBM 8/17  8/22 no BM yesterday continue to monitor  -06/23/24 LBM yesterday morning, cont regimen  -8/30 LBM 8/27, advised miralax  at home to regain more regular BMs  14.  CKD 3b: Monitor with serial checks as well as UOP/voiding   - 8/9 creatinine 1.39, overall stable continue to monitor  8/11-a slight bump in BUN/Cr today (20/1.52).  I'm not too concerned at present. WIll encourage PO and follow up lab work on Wednesday.  8/14 Cr little higher at 1.58/BUN 25, still around overall baseline, continue encourage oral fluids again today 8/15 discussed oral fluid intake, continue to monitor labs.  If increases further consider some short-term IV fluids  8/16 Cr reviewed and is mildly improved  8/18 creatinine a little higher, will start some gentle IVF 8/20 BUN 24, Cr down to 1.42, recheck Friday, DC IVF. Encourage oral fluids 8/22 creatinine and BUN a little higher today at 1.5/28, continue to encourage oral fluids 8/25 IV fluids 75 mL per hour as creatinine up to 1.79/BUN 33.  Appears she continues to have poor p.o. intake 8/26 IVF changed to 7ml/hr, recheck tomorrow  8/27 Cr Trending down to 1.65 increase to 75ml /hr recheck tomorrow 8/27 Cr still trending down, continue IV fluids for now 8/28 DC IV fluids, creatinine improved today 15. Obesity. Class 2. Dietary counseling   -Body mass index is 34.11 kg/m.     LOS: 22 days A FACE TO FACE EVALUATION WAS PERFORMED  [id number] 06/29/2024, 12:44 PM

## 2024-06-29 NOTE — Plan of Care (Signed)
  Problem: RH Balance Goal: LTG: Patient will maintain dynamic sitting balance (OT) Description: LTG:  Patient will maintain dynamic sitting balance with assistance during activities of daily living (OT) Outcome: Completed/Met Goal: LTG Patient will maintain dynamic standing with ADLs (OT) Description: LTG:  Patient will maintain dynamic standing balance with assist during activities of daily living (OT)  Outcome: Completed/Met   Problem: Sit to Stand Goal: LTG:  Patient will perform sit to stand in prep for activites of daily living with assistance level (OT) Description: LTG:  Patient will perform sit to stand in prep for activites of daily living with assistance level (OT) Outcome: Completed/Met   Problem: RH Grooming Goal: LTG Patient will perform grooming w/assist,cues/equip (OT) Description: LTG: Patient will perform grooming with assist, with/without cues using equipment (OT) Outcome: Completed/Met   Problem: RH Bathing Goal: LTG Patient will bathe all body parts with assist levels (OT) Description: LTG: Patient will bathe all body parts with assist levels (OT) Outcome: Completed/Met   Problem: RH Dressing Goal: LTG Patient will perform upper body dressing (OT) Description: LTG Patient will perform upper body dressing with assist, with/without cues (OT). Outcome: Completed/Met Goal: LTG Patient will perform lower body dressing w/assist (OT) Description: LTG: Patient will perform lower body dressing with assist, with/without cues in positioning using equipment (OT) Outcome: Completed/Met   Problem: RH Toileting Goal: LTG Patient will perform toileting task (3/3 steps) with assistance level (OT) Description: LTG: Patient will perform toileting task (3/3 steps) with assistance level (OT)  Outcome: Completed/Met   Problem: RH Toilet Transfers Goal: LTG Patient will perform toilet transfers w/assist (OT) Description: LTG: Patient will perform toilet transfers with assist,  with/without cues using equipment (OT) Outcome: Completed/Met

## 2024-07-02 ENCOUNTER — Other Ambulatory Visit (HOSPITAL_BASED_OUTPATIENT_CLINIC_OR_DEPARTMENT_OTHER): Payer: Self-pay

## 2024-07-02 ENCOUNTER — Encounter: Payer: Self-pay | Admitting: Internal Medicine

## 2024-07-02 ENCOUNTER — Other Ambulatory Visit: Payer: Self-pay

## 2024-07-02 DIAGNOSIS — E785 Hyperlipidemia, unspecified: Secondary | ICD-10-CM | POA: Diagnosis not present

## 2024-07-02 DIAGNOSIS — M5030 Other cervical disc degeneration, unspecified cervical region: Secondary | ICD-10-CM | POA: Diagnosis not present

## 2024-07-02 DIAGNOSIS — E1169 Type 2 diabetes mellitus with other specified complication: Secondary | ICD-10-CM | POA: Diagnosis not present

## 2024-07-02 DIAGNOSIS — E669 Obesity, unspecified: Secondary | ICD-10-CM | POA: Diagnosis not present

## 2024-07-02 DIAGNOSIS — R32 Unspecified urinary incontinence: Secondary | ICD-10-CM | POA: Diagnosis not present

## 2024-07-02 DIAGNOSIS — E113551 Type 2 diabetes mellitus with stable proliferative diabetic retinopathy, right eye: Secondary | ICD-10-CM | POA: Diagnosis not present

## 2024-07-02 DIAGNOSIS — N184 Chronic kidney disease, stage 4 (severe): Secondary | ICD-10-CM | POA: Diagnosis not present

## 2024-07-02 DIAGNOSIS — G8929 Other chronic pain: Secondary | ICD-10-CM | POA: Diagnosis not present

## 2024-07-02 DIAGNOSIS — N390 Urinary tract infection, site not specified: Secondary | ICD-10-CM | POA: Diagnosis not present

## 2024-07-02 DIAGNOSIS — H3530 Unspecified macular degeneration: Secondary | ICD-10-CM | POA: Diagnosis not present

## 2024-07-02 DIAGNOSIS — N3281 Overactive bladder: Secondary | ICD-10-CM | POA: Diagnosis not present

## 2024-07-02 DIAGNOSIS — E1122 Type 2 diabetes mellitus with diabetic chronic kidney disease: Secondary | ICD-10-CM | POA: Diagnosis not present

## 2024-07-02 DIAGNOSIS — M160 Bilateral primary osteoarthritis of hip: Secondary | ICD-10-CM | POA: Diagnosis not present

## 2024-07-02 DIAGNOSIS — M810 Age-related osteoporosis without current pathological fracture: Secondary | ICD-10-CM | POA: Diagnosis not present

## 2024-07-02 DIAGNOSIS — Z792 Long term (current) use of antibiotics: Secondary | ICD-10-CM | POA: Diagnosis not present

## 2024-07-02 DIAGNOSIS — E1151 Type 2 diabetes mellitus with diabetic peripheral angiopathy without gangrene: Secondary | ICD-10-CM | POA: Diagnosis not present

## 2024-07-02 DIAGNOSIS — Z981 Arthrodesis status: Secondary | ICD-10-CM | POA: Diagnosis not present

## 2024-07-02 DIAGNOSIS — D631 Anemia in chronic kidney disease: Secondary | ICD-10-CM | POA: Diagnosis not present

## 2024-07-02 DIAGNOSIS — E113392 Type 2 diabetes mellitus with moderate nonproliferative diabetic retinopathy without macular edema, left eye: Secondary | ICD-10-CM | POA: Diagnosis not present

## 2024-07-02 DIAGNOSIS — Z7982 Long term (current) use of aspirin: Secondary | ICD-10-CM | POA: Diagnosis not present

## 2024-07-02 DIAGNOSIS — I129 Hypertensive chronic kidney disease with stage 1 through stage 4 chronic kidney disease, or unspecified chronic kidney disease: Secondary | ICD-10-CM | POA: Diagnosis not present

## 2024-07-02 DIAGNOSIS — M4727 Other spondylosis with radiculopathy, lumbosacral region: Secondary | ICD-10-CM | POA: Diagnosis not present

## 2024-07-02 DIAGNOSIS — G4710 Hypersomnia, unspecified: Secondary | ICD-10-CM | POA: Diagnosis not present

## 2024-07-02 DIAGNOSIS — F03A18 Unspecified dementia, mild, with other behavioral disturbance: Secondary | ICD-10-CM | POA: Diagnosis not present

## 2024-07-02 DIAGNOSIS — R339 Retention of urine, unspecified: Secondary | ICD-10-CM | POA: Diagnosis not present

## 2024-07-03 DIAGNOSIS — N189 Chronic kidney disease, unspecified: Secondary | ICD-10-CM | POA: Insufficient documentation

## 2024-07-03 DIAGNOSIS — N390 Urinary tract infection, site not specified: Secondary | ICD-10-CM | POA: Insufficient documentation

## 2024-07-03 DIAGNOSIS — M169 Osteoarthritis of hip, unspecified: Secondary | ICD-10-CM | POA: Insufficient documentation

## 2024-07-03 DIAGNOSIS — F0390 Unspecified dementia without behavioral disturbance: Secondary | ICD-10-CM | POA: Insufficient documentation

## 2024-07-04 DIAGNOSIS — E669 Obesity, unspecified: Secondary | ICD-10-CM | POA: Diagnosis not present

## 2024-07-04 DIAGNOSIS — N390 Urinary tract infection, site not specified: Secondary | ICD-10-CM | POA: Diagnosis not present

## 2024-07-04 DIAGNOSIS — E1151 Type 2 diabetes mellitus with diabetic peripheral angiopathy without gangrene: Secondary | ICD-10-CM | POA: Diagnosis not present

## 2024-07-04 DIAGNOSIS — E1169 Type 2 diabetes mellitus with other specified complication: Secondary | ICD-10-CM | POA: Diagnosis not present

## 2024-07-04 DIAGNOSIS — E113392 Type 2 diabetes mellitus with moderate nonproliferative diabetic retinopathy without macular edema, left eye: Secondary | ICD-10-CM | POA: Diagnosis not present

## 2024-07-04 DIAGNOSIS — E113551 Type 2 diabetes mellitus with stable proliferative diabetic retinopathy, right eye: Secondary | ICD-10-CM | POA: Diagnosis not present

## 2024-07-05 ENCOUNTER — Ambulatory Visit

## 2024-07-05 VITALS — BP 170/72 | HR 58 | Temp 98.4°F | Resp 18 | Ht 67.0 in | Wt 216.0 lb

## 2024-07-05 DIAGNOSIS — M810 Age-related osteoporosis without current pathological fracture: Secondary | ICD-10-CM | POA: Diagnosis not present

## 2024-07-05 MED ORDER — DENOSUMAB 60 MG/ML ~~LOC~~ SOSY
60.0000 mg | PREFILLED_SYRINGE | Freq: Once | SUBCUTANEOUS | Status: AC
  Administered 2024-07-05: 60 mg via SUBCUTANEOUS
  Filled 2024-07-05: qty 1

## 2024-07-05 NOTE — Progress Notes (Signed)
 Diagnosis: Osteoporosis  Provider:  Mannam, Praveen MD  Procedure: Injection  Prolia  (Denosumab ), Dose: 60 mg, Site: subcutaneous, Number of injections: 1  Injection Site(s): Left arm  Post Care: Patient declined observation  Discharge: Condition: Good, Destination: Home . AVS Declined  Performed by:  Lendel Quant, RN

## 2024-07-08 ENCOUNTER — Other Ambulatory Visit (HOSPITAL_BASED_OUTPATIENT_CLINIC_OR_DEPARTMENT_OTHER): Payer: Self-pay

## 2024-07-08 ENCOUNTER — Ambulatory Visit (HOSPITAL_COMMUNITY)

## 2024-07-08 DIAGNOSIS — N390 Urinary tract infection, site not specified: Secondary | ICD-10-CM | POA: Diagnosis not present

## 2024-07-08 DIAGNOSIS — I10 Essential (primary) hypertension: Secondary | ICD-10-CM | POA: Diagnosis not present

## 2024-07-08 DIAGNOSIS — E1122 Type 2 diabetes mellitus with diabetic chronic kidney disease: Secondary | ICD-10-CM | POA: Diagnosis not present

## 2024-07-08 DIAGNOSIS — E1169 Type 2 diabetes mellitus with other specified complication: Secondary | ICD-10-CM | POA: Diagnosis not present

## 2024-07-08 DIAGNOSIS — N184 Chronic kidney disease, stage 4 (severe): Secondary | ICD-10-CM | POA: Diagnosis not present

## 2024-07-08 DIAGNOSIS — N952 Postmenopausal atrophic vaginitis: Secondary | ICD-10-CM | POA: Diagnosis not present

## 2024-07-08 DIAGNOSIS — M160 Bilateral primary osteoarthritis of hip: Secondary | ICD-10-CM | POA: Diagnosis not present

## 2024-07-08 DIAGNOSIS — R3915 Urgency of urination: Secondary | ICD-10-CM | POA: Diagnosis not present

## 2024-07-08 DIAGNOSIS — N3020 Other chronic cystitis without hematuria: Secondary | ICD-10-CM | POA: Diagnosis not present

## 2024-07-08 DIAGNOSIS — Z029 Encounter for administrative examinations, unspecified: Secondary | ICD-10-CM | POA: Diagnosis not present

## 2024-07-08 DIAGNOSIS — R5381 Other malaise: Secondary | ICD-10-CM | POA: Diagnosis not present

## 2024-07-08 DIAGNOSIS — R600 Localized edema: Secondary | ICD-10-CM | POA: Diagnosis not present

## 2024-07-08 DIAGNOSIS — I129 Hypertensive chronic kidney disease with stage 1 through stage 4 chronic kidney disease, or unspecified chronic kidney disease: Secondary | ICD-10-CM | POA: Diagnosis not present

## 2024-07-08 DIAGNOSIS — R339 Retention of urine, unspecified: Secondary | ICD-10-CM | POA: Diagnosis not present

## 2024-07-08 DIAGNOSIS — D649 Anemia, unspecified: Secondary | ICD-10-CM | POA: Diagnosis not present

## 2024-07-08 DIAGNOSIS — F03B0 Unspecified dementia, moderate, without behavioral disturbance, psychotic disturbance, mood disturbance, and anxiety: Secondary | ICD-10-CM | POA: Diagnosis not present

## 2024-07-08 MED ORDER — AMPICILLIN 500 MG PO CAPS
500.0000 mg | ORAL_CAPSULE | Freq: Two times a day (BID) | ORAL | 3 refills | Status: AC | PRN
  Filled 2024-07-08: qty 30, 15d supply, fill #0
  Filled 2024-09-20: qty 30, 15d supply, fill #1
  Filled 2024-10-15: qty 30, 15d supply, fill #2

## 2024-07-09 ENCOUNTER — Other Ambulatory Visit (HOSPITAL_COMMUNITY): Payer: Self-pay | Admitting: Family Medicine

## 2024-07-09 DIAGNOSIS — R600 Localized edema: Secondary | ICD-10-CM

## 2024-07-10 ENCOUNTER — Ambulatory Visit (HOSPITAL_COMMUNITY)
Admission: RE | Admit: 2024-07-10 | Discharge: 2024-07-10 | Disposition: A | Discharge status: Home / Self Care | Source: Ambulatory Visit | Attending: Vascular Surgery | Admitting: Vascular Surgery

## 2024-07-10 DIAGNOSIS — R600 Localized edema: Secondary | ICD-10-CM | POA: Diagnosis not present

## 2024-07-12 DIAGNOSIS — E669 Obesity, unspecified: Secondary | ICD-10-CM | POA: Diagnosis not present

## 2024-07-12 DIAGNOSIS — E1169 Type 2 diabetes mellitus with other specified complication: Secondary | ICD-10-CM | POA: Diagnosis not present

## 2024-07-12 DIAGNOSIS — E1151 Type 2 diabetes mellitus with diabetic peripheral angiopathy without gangrene: Secondary | ICD-10-CM | POA: Diagnosis not present

## 2024-07-12 DIAGNOSIS — E113551 Type 2 diabetes mellitus with stable proliferative diabetic retinopathy, right eye: Secondary | ICD-10-CM | POA: Diagnosis not present

## 2024-07-12 DIAGNOSIS — N390 Urinary tract infection, site not specified: Secondary | ICD-10-CM | POA: Diagnosis not present

## 2024-07-12 DIAGNOSIS — E113392 Type 2 diabetes mellitus with moderate nonproliferative diabetic retinopathy without macular edema, left eye: Secondary | ICD-10-CM | POA: Diagnosis not present

## 2024-07-14 ENCOUNTER — Emergency Department (HOSPITAL_BASED_OUTPATIENT_CLINIC_OR_DEPARTMENT_OTHER)

## 2024-07-14 ENCOUNTER — Emergency Department (HOSPITAL_BASED_OUTPATIENT_CLINIC_OR_DEPARTMENT_OTHER)
Admission: EM | Admit: 2024-07-14 | Discharge: 2024-07-16 | Disposition: A | Discharge status: Home / Self Care | Attending: Emergency Medicine | Admitting: Emergency Medicine

## 2024-07-14 ENCOUNTER — Encounter (HOSPITAL_BASED_OUTPATIENT_CLINIC_OR_DEPARTMENT_OTHER): Payer: Self-pay

## 2024-07-14 ENCOUNTER — Other Ambulatory Visit: Payer: Self-pay

## 2024-07-14 DIAGNOSIS — I129 Hypertensive chronic kidney disease with stage 1 through stage 4 chronic kidney disease, or unspecified chronic kidney disease: Secondary | ICD-10-CM | POA: Diagnosis not present

## 2024-07-14 DIAGNOSIS — F0390 Unspecified dementia without behavioral disturbance: Secondary | ICD-10-CM | POA: Diagnosis not present

## 2024-07-14 DIAGNOSIS — R531 Weakness: Secondary | ICD-10-CM | POA: Diagnosis not present

## 2024-07-14 DIAGNOSIS — R410 Disorientation, unspecified: Secondary | ICD-10-CM | POA: Diagnosis not present

## 2024-07-14 DIAGNOSIS — R1084 Generalized abdominal pain: Secondary | ICD-10-CM | POA: Insufficient documentation

## 2024-07-14 DIAGNOSIS — I517 Cardiomegaly: Secondary | ICD-10-CM | POA: Diagnosis not present

## 2024-07-14 DIAGNOSIS — Z7982 Long term (current) use of aspirin: Secondary | ICD-10-CM | POA: Insufficient documentation

## 2024-07-14 DIAGNOSIS — Z7401 Bed confinement status: Secondary | ICD-10-CM | POA: Diagnosis not present

## 2024-07-14 DIAGNOSIS — R109 Unspecified abdominal pain: Secondary | ICD-10-CM | POA: Diagnosis not present

## 2024-07-14 DIAGNOSIS — R339 Retention of urine, unspecified: Secondary | ICD-10-CM | POA: Diagnosis not present

## 2024-07-14 DIAGNOSIS — N189 Chronic kidney disease, unspecified: Secondary | ICD-10-CM | POA: Diagnosis not present

## 2024-07-14 DIAGNOSIS — I10 Essential (primary) hypertension: Secondary | ICD-10-CM | POA: Diagnosis not present

## 2024-07-14 DIAGNOSIS — Z79899 Other long term (current) drug therapy: Secondary | ICD-10-CM | POA: Insufficient documentation

## 2024-07-14 DIAGNOSIS — E1122 Type 2 diabetes mellitus with diabetic chronic kidney disease: Secondary | ICD-10-CM | POA: Diagnosis not present

## 2024-07-14 DIAGNOSIS — N390 Urinary tract infection, site not specified: Secondary | ICD-10-CM | POA: Diagnosis not present

## 2024-07-14 DIAGNOSIS — R627 Adult failure to thrive: Secondary | ICD-10-CM | POA: Diagnosis not present

## 2024-07-14 DIAGNOSIS — D259 Leiomyoma of uterus, unspecified: Secondary | ICD-10-CM | POA: Diagnosis not present

## 2024-07-14 LAB — COMPREHENSIVE METABOLIC PANEL WITH GFR
ALT: 6 U/L (ref 0–44)
AST: 14 U/L — ABNORMAL LOW (ref 15–41)
Albumin: 3.9 g/dL (ref 3.5–5.0)
Alkaline Phosphatase: 75 U/L (ref 38–126)
Anion gap: 14 (ref 5–15)
BUN: 15 mg/dL (ref 8–23)
CO2: 21 mmol/L — ABNORMAL LOW (ref 22–32)
Calcium: 8.9 mg/dL (ref 8.9–10.3)
Chloride: 104 mmol/L (ref 98–111)
Creatinine, Ser: 1.68 mg/dL — ABNORMAL HIGH (ref 0.44–1.00)
GFR, Estimated: 29 mL/min — ABNORMAL LOW
Glucose, Bld: 190 mg/dL — ABNORMAL HIGH (ref 70–99)
Potassium: 4.2 mmol/L (ref 3.5–5.1)
Sodium: 138 mmol/L (ref 135–145)
Total Bilirubin: 0.4 mg/dL (ref 0.0–1.2)
Total Protein: 7.2 g/dL (ref 6.5–8.1)

## 2024-07-14 LAB — CBC WITH DIFFERENTIAL/PLATELET
Abs Immature Granulocytes: 0 K/uL (ref 0.00–0.07)
Basophils Absolute: 0 K/uL (ref 0.0–0.1)
Basophils Relative: 1 %
Eosinophils Absolute: 0.2 K/uL (ref 0.0–0.5)
Eosinophils Relative: 3 %
HCT: 30.8 % — ABNORMAL LOW (ref 36.0–46.0)
Hemoglobin: 10 g/dL — ABNORMAL LOW (ref 12.0–15.0)
Immature Granulocytes: 0 %
Lymphocytes Relative: 34 %
Lymphs Abs: 1.9 K/uL (ref 0.7–4.0)
MCH: 29.6 pg (ref 26.0–34.0)
MCHC: 32.5 g/dL (ref 30.0–36.0)
MCV: 91.1 fL (ref 80.0–100.0)
Monocytes Absolute: 0.5 K/uL (ref 0.1–1.0)
Monocytes Relative: 9 %
Neutro Abs: 3.1 K/uL (ref 1.7–7.7)
Neutrophils Relative %: 53 %
Platelets: 178 K/uL (ref 150–400)
RBC: 3.38 MIL/uL — ABNORMAL LOW (ref 3.87–5.11)
RDW: 15.4 % (ref 11.5–15.5)
WBC: 5.7 K/uL (ref 4.0–10.5)
nRBC: 0 % (ref 0.0–0.2)

## 2024-07-14 LAB — RESP PANEL BY RT-PCR (RSV, FLU A&B, COVID)  RVPGX2
Influenza A by PCR: NEGATIVE
Influenza B by PCR: NEGATIVE
Resp Syncytial Virus by PCR: NEGATIVE
SARS Coronavirus 2 by RT PCR: NEGATIVE

## 2024-07-14 LAB — PROTIME-INR
INR: 1 (ref 0.8–1.2)
Prothrombin Time: 13.8 s (ref 11.4–15.2)

## 2024-07-14 LAB — LACTIC ACID, PLASMA
Lactic Acid, Venous: 2 mmol/L (ref 0.5–1.9)
Lactic Acid, Venous: 1.2 mmol/L (ref 0.5–1.9)

## 2024-07-14 LAB — URINALYSIS, W/ REFLEX TO CULTURE (INFECTION SUSPECTED)
Bacteria, UA: NONE SEEN
Bilirubin Urine: NEGATIVE
Glucose, UA: NEGATIVE mg/dL
Hgb urine dipstick: NEGATIVE
Ketones, ur: NEGATIVE mg/dL
Leukocytes,Ua: NEGATIVE
Nitrite: NEGATIVE
Protein, ur: 100 mg/dL — AB
Specific Gravity, Urine: 1.007 (ref 1.005–1.030)
pH: 6.5 (ref 5.0–8.0)

## 2024-07-14 LAB — CBG MONITORING, ED: Glucose-Capillary: 187 mg/dL — ABNORMAL HIGH (ref 70–99)

## 2024-07-14 MED ORDER — AMPICILLIN 500 MG PO CAPS
500.0000 mg | ORAL_CAPSULE | Freq: Once | ORAL | Status: DC
  Filled 2024-07-14 (×2): qty 1

## 2024-07-14 MED ORDER — ASPIRIN 81 MG PO TBEC
81.0000 mg | DELAYED_RELEASE_TABLET | Freq: Every day | ORAL | Status: DC
  Administered 2024-07-14 – 2024-07-16 (×3): 81 mg via ORAL
  Filled 2024-07-14 (×5): qty 1

## 2024-07-14 MED ORDER — GALANTAMINE HYDROBROMIDE ER 8 MG PO CP24
8.0000 mg | ORAL_CAPSULE | Freq: Every day | ORAL | Status: DC
  Administered 2024-07-16: 8 mg via ORAL
  Filled 2024-07-14 (×3): qty 1

## 2024-07-14 MED ORDER — SODIUM CHLORIDE 0.9 % IV BOLUS
1000.0000 mL | Freq: Once | INTRAVENOUS | Status: AC
  Administered 2024-07-14: 1000 mL via INTRAVENOUS

## 2024-07-14 MED ORDER — MONTELUKAST SODIUM 10 MG PO TABS
10.0000 mg | ORAL_TABLET | Freq: Every day | ORAL | Status: DC
  Administered 2024-07-15 – 2024-07-16 (×2): 10 mg via ORAL
  Filled 2024-07-14 (×4): qty 1

## 2024-07-14 MED ORDER — HYDRALAZINE HCL 50 MG PO TABS
50.0000 mg | ORAL_TABLET | Freq: Four times a day (QID) | ORAL | Status: DC
  Administered 2024-07-14 – 2024-07-16 (×7): 50 mg via ORAL
  Filled 2024-07-14 (×3): qty 1
  Filled 2024-07-14: qty 2
  Filled 2024-07-14: qty 1
  Filled 2024-07-14: qty 2
  Filled 2024-07-14: qty 1
  Filled 2024-07-14: qty 2

## 2024-07-14 MED ORDER — MELATONIN 3 MG PO TABS: 3.0000 mg | ORAL_TABLET | Freq: Every evening | ORAL | Status: DC | PRN

## 2024-07-14 MED ORDER — AMLODIPINE BESYLATE 5 MG PO TABS
10.0000 mg | ORAL_TABLET | Freq: Every day | ORAL | Status: DC
  Administered 2024-07-14 – 2024-07-15 (×2): 10 mg via ORAL
  Filled 2024-07-14 (×3): qty 2

## 2024-07-14 MED ORDER — ACETAMINOPHEN 325 MG PO TABS
650.0000 mg | ORAL_TABLET | ORAL | Status: DC | PRN
  Filled 2024-07-14: qty 2

## 2024-07-14 MED ORDER — ROSUVASTATIN CALCIUM 5 MG PO TABS
5.0000 mg | ORAL_TABLET | Freq: Every day | ORAL | Status: DC
Start: 2024-07-14 — End: 2024-07-16
  Administered 2024-07-15 – 2024-07-16 (×2): 5 mg via ORAL
  Filled 2024-07-14 (×4): qty 1

## 2024-07-14 MED ORDER — NEBIVOLOL HCL 5 MG PO TABS
5.0000 mg | ORAL_TABLET | Freq: Every day | ORAL | Status: DC
  Administered 2024-07-15 (×2): 5 mg via ORAL
  Filled 2024-07-14 (×5): qty 1

## 2024-07-14 MED ORDER — FLUTICASONE PROPIONATE 50 MCG/ACT NA SUSP
1.0000 | Freq: Every day | NASAL | Status: DC
  Administered 2024-07-14 – 2024-07-16 (×2): 1 via NASAL
  Filled 2024-07-14 (×3): qty 16

## 2024-07-14 NOTE — ED Notes (Signed)
 [facility] in ED preparing pt for transfer to [facility] ED

## 2024-07-14 NOTE — ED Notes (Signed)
 CRITICAL VALUE STICKER  CRITICAL VALUE:Lactate 2.0  RECEIVER (on-site recipient of call):ONEIDA Sharps  DATE & TIME NOTIFIED:   MESSENGER (representative from lab):  MD NOTIFIED: [place]  TIME OF NOTIFICATION:1537  RESPONSE:

## 2024-07-14 NOTE — ED Notes (Signed)
 Advised by off going EMT that urine obtained via in and out cath prior to shift change

## 2024-07-14 NOTE — ED Triage Notes (Signed)
 Pt daughter reports pt dx with UTI last week and started ABX. Pt has become increasingly weak, more confused, refusing to take PO fluids or meds.

## 2024-07-14 NOTE — ED Triage Notes (Signed)
 Patient arrives from [facility] for PT/OT evaluation via [facility]

## 2024-07-14 NOTE — ED Provider Notes (Signed)
 [place] EMERGENCY DEPARTMENT AT [facility] Provider Note   CSN: [id number] Arrival date & time: 07/14/24  1430     Patient presents with: Weakness and Recurrent UTI   [name] Mathews is a 89 y.o. female.   Patient here with neurolyse weakness.  Finishing a course of antibiotics for recurrent UTI.  She denies any nausea vomiting diarrhea.  She has had some abdominal tenderness at times but none currently.  She denies any falls.  No headache no cough no sputum production.  Has not had a fever at home.  Still the concern for dehydration.  The history is provided by the patient and a caregiver.       Prior to Admission medications   Medication Sig Start Date End Date Taking? Authorizing Provider  acetaminophen  (TYLENOL ) 325 MG tablet Take 2 tablets (650 mg total) by mouth every 4 (four) hours as needed for mild pain (pain score 1-3). 06/13/24   Love, Sharlet GORMAN, PA-C  amLODipine  (NORVASC ) 10 MG tablet Take 1 tablet (10 mg total) by mouth daily after supper. 06/28/24   Love, Sharlet GORMAN, PA-C  amoxicillin  (AMOXIL ) 250 MG chewable tablet Chew 1 tablet (250 mg total) by mouth every 12 (twelve) hours. 06/28/24   Love, Sharlet GORMAN, PA-C  ampicillin  (PRINCIPEN) 500 MG capsule Take 1 capsule (500 mg total) by mouth 2 (two) times daily as needed for 3 days as needed for suspect UTI. 07/08/24     aspirin  81 MG tablet Take 81 mg by mouth daily.    [provider]  Bacillus Coagulans-Inulin (ALIGN PREBIOTIC-PROBIOTIC PO) Take by mouth.    [provider]  bethanechol  (URECHOLINE ) 10 MG tablet Take 2 tablets (20 mg total) by mouth 3 (three) times daily. 06/28/24   Love, Sharlet GORMAN, PA-C  denosumab  (PROLIA ) 60 MG/ML SOSY injection Inject 60 mg into the skin every 6 (six) months.    [provider]  docusate sodium  (COLACE) 100 MG capsule Take 1 capsule (100 mg total) by mouth 2 (two) times daily. 06/28/24   Love, Sharlet GORMAN, PA-C  fluticasone  (FLONASE ) 50 MCG/ACT nasal spray Spray  2 sprays into each nostril every day once daily. 08/10/22     galantamine  (RAZADYNE  ER) 8 MG 24 hr capsule Take 1 capsule (8 mg total) by mouth every morning with food. 10/27/23     glucose blood (ONETOUCH VERIO) test strip Use 1 test strip to check blood glucose once a day. 05/07/24     hydrALAZINE  (APRESOLINE ) 50 MG tablet Take 1 tablet (50 mg total) by mouth every 6 (six) hours. 06/28/24   Love, Sharlet GORMAN, PA-C  iron  polysaccharides (NIFEREX) 150 MG capsule Take 1 capsule (150 mg total) by mouth daily with supper. 06/28/24   Love, Sharlet GORMAN, PA-C  Lactobacillus-Inulin (PROBIOTIC DIGESTIVE SUPPORT PO) Take 2 each by mouth every morning.    [provider]  Lancets [facility] DELICA PLUS [place]) MISC Apply 1 each topically daily. 12/25/19   [provider]  lidocaine  (LIDODERM ) 5 % Place 1 patch onto the skin daily. Remove & Discard patch within 12 hours or as directed by MD 06/28/24   Love, Sharlet GORMAN, PA-C  Lidocaine -Hydrocort , Perianal, 3-0.5 % CREA Apply to rectum 2 (two) times daily as needed externally. 10/12/23     linaclotide  (LINZESS ) 145 MCG CAPS capsule Take 1 capsule (145 mcg total) by mouth daily as needed. Per daughter, patient takes twice weekly as needed (days fluctuate from week to week) 06/28/24   Love, Sharlet  S, PA-C  Magnesium  200 MG TABS Take 400 mg by mouth daily.    [provider]  melatonin 3 MG TABS tablet Take 1 tablet (3 mg total) by mouth at bedtime as needed. 06/13/24   Love, Sharlet RAMAN, PA-C  montelukast  (SINGULAIR ) 10 MG tablet Take 1 tablet (10 mg total) by mouth daily. 10/27/23     Multiple Vitamin (MULTIVITAMIN) tablet Take 1 tablet by mouth every morning.    [provider]  nebivolol  (BYSTOLIC ) 5 MG tablet Take 1 tablet (5 mg total) by mouth at bedtime. 11/09/23     rosuvastatin  (CRESTOR ) 5 MG tablet Take 1 tablet (5 mg total) by mouth daily. 10/27/23     tamsulosin  (FLOMAX ) 0.4 MG CAPS capsule Take 2 capsules (0.8 mg total) by mouth at  bedtime. 06/28/24   Love, Sharlet RAMAN, PA-C  vitamin B-12 (CYANOCOBALAMIN ) 1000 MCG tablet Take 1,000 mcg by mouth daily. OTC    [provider]  vitamin C  (ASCORBIC ACID ) 500 MG tablet Take 500 mg by mouth daily.    [provider]  zinc  sulfate, 50mg  elemental zinc , 220 (50 Zn) MG capsule Take 220 mg by mouth daily.    [provider]    Allergies: Sulfamethoxazole-trimethoprim, Telmisartan , Januvia [sitagliptin], Metformin , Pioglitazone , and Simvastatin    Review of Systems  Updated Vital Signs BP (!) 163/57   Pulse (!) 55   Temp (!) 96.9 F (36.1 C) (Rectal)   Resp 19   Ht 5' 7 (1.702 m)   Wt 98 kg   SpO2 99%   BMI 33.83 kg/m   Physical Exam Vitals and nursing note reviewed.  Constitutional:      General: She is not in acute distress.    Appearance: She is well-developed.  HENT:     Head: Normocephalic and atraumatic.     Nose: Nose normal.     Mouth/Throat:     Mouth: Mucous membranes are dry.  Eyes:     Extraocular Movements: Extraocular movements intact.     Conjunctiva/sclera: Conjunctivae normal.     Pupils: Pupils are equal, round, and reactive to light.  Cardiovascular:     Rate and Rhythm: Normal rate and regular rhythm.     Heart sounds: No murmur heard. Pulmonary:     Effort: Pulmonary effort is normal. No respiratory distress.     Breath sounds: Normal breath sounds.  Abdominal:     Palpations: Abdomen is soft.     Tenderness: There is abdominal tenderness.  Musculoskeletal:        General: No swelling.     Cervical back: Neck supple.  Skin:    General: Skin is warm and dry.     Capillary Refill: Capillary refill takes less than 2 seconds.  Neurological:     Mental Status: She is alert.  Psychiatric:        Mood and Affect: Mood normal.     (all labs ordered are listed, but only abnormal results are displayed) Labs Reviewed  COMPREHENSIVE METABOLIC PANEL WITH GFR - Abnormal; Notable for the following components:       Result Value   CO2 21 (*)    Glucose, Bld 190 (*)    Creatinine, Ser 1.68 (*)    AST 14 (*)    GFR, Estimated 29 (*)    All other components within normal limits  LACTIC ACID, PLASMA - Abnormal; Notable for the following components:   Lactic Acid, Venous 2.0 (*)    All other components  within normal limits  CBC WITH DIFFERENTIAL/PLATELET - Abnormal; Notable for the following components:   RBC 3.38 (*)    Hemoglobin 10.0 (*)    HCT 30.8 (*)    All other components within normal limits  URINALYSIS, W/ REFLEX TO CULTURE (INFECTION SUSPECTED) - Abnormal; Notable for the following components:   Protein, ur 100 (*)    All other components within normal limits  CBG MONITORING, ED - Abnormal; Notable for the following components:   Glucose-Capillary 187 (*)    All other components within normal limits  RESP PANEL BY RT-PCR (RSV, FLU A&B, COVID)  [id number]  CULTURE, BLOOD (ROUTINE X 2)  CULTURE, BLOOD (ROUTINE X 2)  PROTIME-INR  LACTIC ACID, PLASMA    EKG: EKG Interpretation Date/Time:  Sunday July 14 2024 15:44:25 EDT Ventricular Rate:  50 PR Interval:  190 QRS Duration:  139 QT Interval:  520 QTC Calculation: 475 R Axis:   -4  Text Interpretation: Sinus rhythm Right bundle branch block Confirmed by Ruthe Cornet [phone]) on 07/14/2024 3:52:27 PM  Radiology: CT ABDOMEN PELVIS WO CONTRAST Result Date: 07/14/2024 CLINICAL DATA:  Acute abdominal pain. Recurrent urinary tract infection and difficulty voiding. EXAM: CT ABDOMEN AND PELVIS WITHOUT CONTRAST TECHNIQUE: Multidetector CT imaging of the abdomen and pelvis was performed following the standard protocol without IV contrast. RADIATION DOSE REDUCTION: This exam was performed according to the departmental dose-optimization program which includes automated exposure control, adjustment of the mA and/or kV according to patient size and/or use of iterative reconstruction technique. COMPARISON:  12/15/2021 FINDINGS: Lower chest: Coronary  and descending thoracic aortic atherosclerotic vascular calcification. Mild dependent subsegmental atelectasis in both lower lobes. Mild cardiomegaly. Hepatobiliary: Unremarkable Pancreas: Unremarkable Spleen: Unremarkable Adrenals/Urinary Tract: No significant hydronephrosis or hydroureter. Urinary bladder lumen measures 14.5 by 7.7 by 11.5 cm (volume = 670 cm^3) at the time of today's CT scan. Mild bladder wall thickening inferiorly. Stomach/Bowel: Unremarkable Vascular/Lymphatic: Substantial atherosclerosis of the abdominal aorta and its branches including the SMA. Reproductive: Calcified uterine fibroids. Other: No supplemental non-categorized findings. Musculoskeletal: Posterolateral rod and pedicle screw fixation with posterior decompression and solid interbody fusion between L2 and L5. Schmorl's node along the inferior endplate of T12. IMPRESSION: 1. Urinary bladder lumen measures 14.5 by 7.7 by 11.5 cm (volume = 670 cc) at the time of today's CT scan. Mild bladder wall thickening inferiorly. 2. Mild cardiomegaly. 3. Calcified uterine fibroids. 4. Posterolateral rod and pedicle screw fixation with posterior decompression and solid interbody fusion between L2 and L5. 5.  Aortic Atherosclerosis (ICD10-I70.0). Electronically Signed   By: Ryan Salvage M.D.   On: 07/14/2024 18:16   DG Chest Portable 1 View Result Date: 07/14/2024 EXAM: 1 VIEW XRAY OF THE CHEST 07/14/2024 03:33:00 PM COMPARISON: 06/04/2024 CLINICAL HISTORY: Weakness. Patient dx'd w/UTI last week, now has confusion, weakness, and is refusing food/fluids. FINDINGS: LUNGS AND PLEURA: No focal pulmonary opacity. No pulmonary edema. No pleural effusion. No pneumothorax. HEART AND MEDIASTINUM: Cardiomegaly is stable. Atherosclerotic changes are present at the aortic arch. BONES AND SOFT TISSUES: No acute osseous abnormality. IMPRESSION: 1. No acute findings. 2. Stable cardiomegaly and atherosclerotic changes at the aortic arch. Electronically  signed by: Lonni Necessary MD 07/14/2024 04:42 PM EDT RP Workstation: [id number]     Procedures   Medications Ordered in the ED  acetaminophen  (TYLENOL ) tablet 650 mg (has no administration in time range)  amLODipine  (NORVASC ) tablet 10 mg (has no administration in time range)  ampicillin  (PRINCIPEN) capsule 500 mg (has no administration in time  range)  aspirin  tablet 81 mg (has no administration in time range)  fluticasone  (FLONASE ) 50 MCG/ACT nasal spray 1 spray (has no administration in time range)  galantamine  (RAZADYNE  ER) 24 hr capsule 8 mg (has no administration in time range)  hydrALAZINE  (APRESOLINE ) tablet 50 mg (has no administration in time range)  nebivolol  (BYSTOLIC ) tablet 5 mg (has no administration in time range)  rosuvastatin  (CRESTOR ) tablet 5 mg (has no administration in time range)  montelukast  (SINGULAIR ) tablet 10 mg (has no administration in time range)  melatonin tablet 3 mg (has no administration in time range)  sodium chloride  0.9 % bolus 1,000 mL (0 mLs Intravenous Stopped 07/14/24 1817)                                    Medical Decision Making Amount and/or Complexity of Data Reviewed Labs: ordered. Radiology: ordered.  Risk OTC drugs. Prescription drug management.   Camillo GORMAN Lacks here with generalized weakness concern for dehydration.  Currently finishing antibiotic for UTI.  History of recurrent UTIs.  History of high cholesterol diabetes hypertension.  Unremarkable vitals.  No fever.  Some generalized tenderness on abdominal exam.  Dry mucous membranes.  Will pursue infectious workup CT abdomen and pelvis will evaluate for ongoing UTI dehydration electrolyte abnormality intra-abdominal process.  Will give IV fluids and reevaluate.  Overall lab work is unremarkable.  There is no significant leukocytosis anemia or electrolyte abnormality.  Creatinine at baseline.  Blood sugar 187.  COVID flu RSV panel negative.  Lactic acid is 2.  Urinalysis  negative for infection.  CT scan of the abdomen pelvis shows no acute findings.  I do think she is having bladder retention/incomplete emptying.  Foley catheter was placed.  I have no concern for stroke or any other acute process.  Seems like she has been having failure to thrive.  Family takes care of her at nighttime they only have home health aide for several hours during the day.  She was recently discharged from rehab.  She has declined since then.  Family does not feel safe taking her home and they think that she needs 24-hour support.  Will have her transferred to [facility] for PT OT evaluation and social work and case management and possible placement versus increased home health care.  Foley catheter placed.  She can follow-up with urology outpatient.  This chart was dictated using voice recognition software.  Despite best efforts to proofread,  errors can occur which can change the documentation meaning.      Final diagnoses:  Urinary retention  Failure to thrive in adult    ED Discharge Orders     None          Ruthe Cornet, DO 07/14/24 1937

## 2024-07-15 DIAGNOSIS — R339 Retention of urine, unspecified: Secondary | ICD-10-CM | POA: Diagnosis not present

## 2024-07-15 NOTE — Progress Notes (Addendum)
 Physical Therapy Quick Note  PT has completed initial evaluation.    Overall, patient at min assistance level.   PT Follow up recommended: Intensive inpatient follow up therapy, <3 hours/day Equipment recommended:  None recommended Complete evaluation note to follow.     Bernardino JINNY Ruth, PT, DPT Acute Rehabilitation Office [phone]

## 2024-07-15 NOTE — TOC Progression Note (Addendum)
 Transition of Care [facility]) - Progression Note    Patient Details  Name: [name] Mathews MRN: [id number] Date of Birth: [birth year]  [facility] CM/SW Contact  Luann SHAUNNA [place], [place] Phone Number: 07/15/2024, 11:53 AM  Clinical Narrative:     CSW called pt's daughter to discuss SNF recommendation. Explained that pt does not meet qualified 3 midnight inpatient stay for medicare to  cover SNF though pt is part of [facility] making her qualify for [facility] SNF waiver. Pt will qualify for medicare to cover SNF though only with facilities that are approved under [facility] waiver. Daughter is agreeable to CSW sending referrals; first choice is [facility]. Daughter is arranging private duty care givers for when pt eventually returns home.  Fl2 completed and bed requests sent in hub.   1500: CSW called pt's daughter and explained SNF offers. She chooses [facility]. CSW confirmed with [facility] that they have a bed tomorrow for pt.   1600 Initially [facility] ran [employer] and thought she would need to admit under her [employer] plan but after calling [employer], they confirmed pt can admit under her medicare with the [facility] waiver tomorrow.   CSW sent email to [email or web address]            Social Drivers of Health (SDOH) Interventions SDOH Screenings   Food Insecurity: No Food Insecurity (04/30/2024)  Housing: Unknown (04/30/2024)  Transportation Needs: No Transportation Needs (04/30/2024)  Utilities: Not At Risk (04/30/2024)  Depression (PHQ2-9): Low Risk  (04/30/2024)  Tobacco Use: Low Risk  (07/14/2024)    Readmission Risk Interventions     No data to display

## 2024-07-15 NOTE — ED Provider Notes (Signed)
 [place] EMERGENCY DEPARTMENT AT [facility] Provider Note   CSN: [id number] Arrival date & time: 07/14/24  1430     History Chief Complaint  Patient presents with   Weakness   Recurrent UTI    HPI: BRYANA FROEMMING is a 89 y.o. female with history pertinent for diabetes, HTN, recurrent UTIs, OSA, CKD, dementia who presents complaining of weakness. Patient arrived via EMS as a transfer from [facility] accompanied by daughter.  History provided by patient and relative: Daughter.  No interpreter required during this encounter.  Patient was recently discharged from a physical rehabilitation facility approximately 2 weeks ago, however has had progressive weakness, malaise, fatigue since being home.  Presented to [facility] due to concerns for weakness, dehydration, UTI.  Patient had reassuring workup, however given patient unable to function at home despite home health PT/OT, patient sent to [facility], ED for evaluation by case management, social work, PT, OT  for possible placement.  Patient's recorded medical, surgical, social, medication list and allergies were reviewed in the Snapshot window as part of the initial history.   Prior to Admission medications   Medication Sig Start Date End Date Taking? Authorizing Provider  acetaminophen  (TYLENOL ) 325 MG tablet Take 2 tablets (650 mg total) by mouth every 4 (four) hours as needed for mild pain (pain score 1-3). 06/13/24   Love, Sharlet GORMAN, PA-C  amLODipine  (NORVASC ) 10 MG tablet Take 1 tablet (10 mg total) by mouth daily after supper. 06/28/24   Love, Sharlet GORMAN, PA-C  amoxicillin  (AMOXIL ) 250 MG chewable tablet Chew 1 tablet (250 mg total) by mouth every 12 (twelve) hours. 06/28/24   Love, Sharlet GORMAN, PA-C  ampicillin  (PRINCIPEN) 500 MG capsule Take 1 capsule (500 mg total) by mouth 2 (two) times daily as needed for 3 days as needed for suspect UTI. 07/08/24     aspirin  81 MG tablet Take 81 mg by mouth daily.    [provider]   Bacillus Coagulans-Inulin (ALIGN PREBIOTIC-PROBIOTIC PO) Take by mouth.    [provider]  bethanechol  (URECHOLINE ) 10 MG tablet Take 2 tablets (20 mg total) by mouth 3 (three) times daily. 06/28/24   Love, Sharlet GORMAN, PA-C  denosumab  (PROLIA ) 60 MG/ML SOSY injection Inject 60 mg into the skin every 6 (six) months.    [provider]  docusate sodium  (COLACE) 100 MG capsule Take 1 capsule (100 mg total) by mouth 2 (two) times daily. 06/28/24   Love, Sharlet GORMAN, PA-C  fluticasone  (FLONASE ) 50 MCG/ACT nasal spray Spray 2 sprays into each nostril every day once daily. 08/10/22     galantamine  (RAZADYNE  ER) 8 MG 24 hr capsule Take 1 capsule (8 mg total) by mouth every morning with food. 10/27/23     glucose blood (ONETOUCH VERIO) test strip Use 1 test strip to check blood glucose once a day. 05/07/24     hydrALAZINE  (APRESOLINE ) 50 MG tablet Take 1 tablet (50 mg total) by mouth every 6 (six) hours. 06/28/24   Love, Sharlet GORMAN, PA-C  iron  polysaccharides (NIFEREX) 150 MG capsule Take 1 capsule (150 mg total) by mouth daily with supper. 06/28/24   Love, Sharlet GORMAN, PA-C  Lactobacillus-Inulin (PROBIOTIC DIGESTIVE SUPPORT PO) Take 2 each by mouth every morning.    [provider]  Lancets [facility] DELICA PLUS [place]) MISC Apply 1 each topically daily. 12/25/19   [provider]  lidocaine  (LIDODERM ) 5 % Place 1 patch onto the skin daily. Remove & Discard patch within 12 hours  or as directed by MD 06/28/24   Love, Sharlet RAMAN, PA-C  Lidocaine -Hydrocort , Perianal, 3-0.5 % CREA Apply to rectum 2 (two) times daily as needed externally. 10/12/23     linaclotide  (LINZESS ) 145 MCG CAPS capsule Take 1 capsule (145 mcg total) by mouth daily as needed. Per daughter, patient takes twice weekly as needed (days fluctuate from week to week) 06/28/24   Love, Sharlet RAMAN, PA-C  Magnesium  200 MG TABS Take 400 mg by mouth daily.    [provider]  melatonin 3 MG TABS tablet Take 1 tablet (3 mg  total) by mouth at bedtime as needed. 06/13/24   Love, Sharlet RAMAN, PA-C  montelukast  (SINGULAIR ) 10 MG tablet Take 1 tablet (10 mg total) by mouth daily. 10/27/23     Multiple Vitamin (MULTIVITAMIN) tablet Take 1 tablet by mouth every morning.    [provider]  nebivolol  (BYSTOLIC ) 5 MG tablet Take 1 tablet (5 mg total) by mouth at bedtime. 11/09/23     rosuvastatin  (CRESTOR ) 5 MG tablet Take 1 tablet (5 mg total) by mouth daily. 10/27/23     tamsulosin  (FLOMAX ) 0.4 MG CAPS capsule Take 2 capsules (0.8 mg total) by mouth at bedtime. 06/28/24   Love, Sharlet RAMAN, PA-C  vitamin B-12 (CYANOCOBALAMIN ) 1000 MCG tablet Take 1,000 mcg by mouth daily. OTC    [provider]  vitamin C  (ASCORBIC ACID ) 500 MG tablet Take 500 mg by mouth daily.    [provider]  zinc  sulfate, 50mg  elemental zinc , 220 (50 Zn) MG capsule Take 220 mg by mouth daily.    [provider]     Allergies: Sulfamethoxazole-trimethoprim, Telmisartan , Januvia [sitagliptin], Metformin , Pioglitazone , and Simvastatin   Review of Systems   ROS as per HPI  Physical Exam Updated Vital Signs BP (!) 190/60 (BP Location: Left Arm)   Pulse (!) 57   Temp 98.7 F (37.1 C) (Oral)   Resp 18   Ht 5' 7 (1.702 m)   Wt 98 kg   SpO2 99%   BMI 33.83 kg/m  Physical Exam Vitals and nursing note reviewed.  Constitutional:      General: She is not in acute distress.    Appearance: She is well-developed.  HENT:     Head: Normocephalic and atraumatic.  Eyes:     Conjunctiva/sclera: Conjunctivae normal.  Cardiovascular:     Rate and Rhythm: Normal rate and regular rhythm.     Heart sounds: No murmur heard. Pulmonary:     Effort: Pulmonary effort is normal. No respiratory distress.     Breath sounds: Normal breath sounds.  Abdominal:     Palpations: Abdomen is soft.     Tenderness: There is no abdominal tenderness.  Musculoskeletal:        General: No swelling.     Cervical back: Neck supple.  Skin:     General: Skin is warm and dry.     Capillary Refill: Capillary refill takes less than 2 seconds.  Neurological:     Mental Status: She is alert.  Psychiatric:        Mood and Affect: Mood normal.     ED Course/ Medical Decision Making/ A&P    Procedures Procedures   Medications Ordered in ED Medications  acetaminophen  (TYLENOL ) tablet 650 mg (has no administration in time range)  amLODipine  (NORVASC ) tablet 10 mg (10 mg Oral Given 07/14/24 1950)  ampicillin  (PRINCIPEN) capsule 500 mg (500 mg Oral Patient Refused/Not Given 07/14/24 2223)  aspirin  EC tablet 81  mg (81 mg Oral Given 07/14/24 1950)  fluticasone  (FLONASE ) 50 MCG/ACT nasal spray 1 spray (1 spray Each Nare Given 07/14/24 1950)  galantamine  (RAZADYNE  ER) 24 hr capsule 8 mg (has no administration in time range)  hydrALAZINE  (APRESOLINE ) tablet 50 mg (50 mg Oral Given 07/14/24 1950)  nebivolol  (BYSTOLIC ) tablet 5 mg (has no administration in time range)  rosuvastatin  (CRESTOR ) tablet 5 mg (5 mg Oral Patient Refused/Not Given 07/14/24 2228)  montelukast  (SINGULAIR ) tablet 10 mg (10 mg Oral Patient Refused/Not Given 07/14/24 2228)  melatonin tablet 3 mg (has no administration in time range)  sodium chloride  0.9 % bolus 1,000 mL (0 mLs Intravenous Stopped 07/14/24 1817)    Medical Decision Making:   AVANTI JETTER is a 89 y.o. female who presents for [facility] evaluation as per above.  Physical exam is pertinent for no focal abnormalities.   The differential includes but is not limited to dehydration, limited mobility, deconditioned status.  Independent historian: Relative: Daughter at bedside  External data reviewed: Notes: Reviewed notes, imaging, labs from OSH which were reassuring  Labs: Additional labs not indicated  Radiology: General imaging not indicated  EKG/Medicine tests: Repeat EKG not indicated                Interventions: Home medications  See the EMR for full details regarding lab and imaging results.  Patient  with no additional acute complaints on exam.  Discussed with patient and daughter that patient will be in the ED overnight, daughter initially under impression that patient would be admitted to our hospital, discussed that given she has no acute medical conditions, there is not an indication for admission at this time, we will hold her in the ED, and have PT/OT/social work/case management evaluate her in the morning, and that will guide further plans for possible placement versus discharge home.  They expressed understanding, home medications and diet ordered.  Presentation is most consistent with acute uncomplicated illness with systemic symptoms  Discussion of management or test interpretations with external provider(s): Not indicated  Risk Drugs: Home medications  Disposition: Care handed off to oncoming provider at the time of signout with plan to follow-up TOC workup in the a.m.  MDM generated using voice dictation software and may contain dictation errors.  Please contact me for any clarification or with any questions.  Clinical Impression:  1. Urinary retention   2. Failure to thrive in adult      Transfer via Transport   Final Clinical Impression(s) / ED Diagnoses Final diagnoses:  Urinary retention  Failure to thrive in adult    Rx / DC Orders ED Discharge Orders     None        Rogelia Jerilynn GORMAN, MD 07/15/24 0025

## 2024-07-15 NOTE — ED Notes (Signed)
 Pt refused to take medicine, when prompted pt asked why do I have to take the medicine at a certain time how do you know people can take pills.Education provided and refused. Pt offered to sit up to eat meal, pt refused saying  I will get up in a minute.

## 2024-07-15 NOTE — ED Provider Notes (Signed)
 Emergency Medicine Observation Re-evaluation Note  [name] Mathews is a 89 y.o. female, seen on rounds today.  Pt initially presented to the ED for complaints of Weakness and Recurrent UTI Currently, the patient is awake, doing PT in her room. Daughter at bedside.   Physical Exam  BP (!) 196/55 (BP Location: Left Arm)   Pulse 61   Temp 98.3 F (36.8 C) (Oral)   Resp 16   Ht 5' 7 (1.702 m)   Wt 98 kg   SpO2 100%   BMI 33.83 kg/m  Physical Exam General: Awake, alert, NAD Lungs: No respiratory distress Psych: Calm, cooperative  ED Course / MDM  EKG:EKG Interpretation Date/Time:  Sunday July 14 2024 15:44:25 EDT Ventricular Rate:  50 PR Interval:  190 QRS Duration:  139 QT Interval:  520 QTC Calculation: 475 R Axis:   -4  Text Interpretation: Sinus rhythm Right bundle branch block Confirmed by Ruthe Cornet [phone]) on 07/14/2024 3:52:27 PM  I have reviewed the labs performed to date as well as medications administered while in observation.  Recent changes in the last 24 hours include none  Plan  Current plan is for waiting for placement, TOC team. I spoke with daughter who is at bedside who is trying to find care for her at home as well.     Gennaro Duwaine CROME, DO 07/15/24 [phone]

## 2024-07-15 NOTE — [place] FL2 (Signed)
 [place]  MEDICAID FL2 LEVEL OF CARE FORM     IDENTIFICATION  Patient Name: [name] Mathews Birthdate: [birth year] Sex: female Admission Date (Current Location): 07/14/2024  [facility] and [place] Number:  Producer, television/film/video and Address:  [facility], [address] [place], [place] [address]      Provider Number: [id number]  Attending Physician Name and Address:  No att. providers found  Relative Name and Phone Number:  Crawford,[name] (Daughter)  [phone] (Mobile)    Current Level of Care: Hospital Recommended Level of Care: Skilled Nursing Facility Prior Approval Number:    Date Approved/Denied:   PASRR Number: [id number]  Discharge Plan: SNF    Current Diagnoses: Patient Active Problem List   Diagnosis Date Noted   UTI (urinary tract infection) 07/03/2024   OA (osteoarthritis) of hip 07/03/2024   Dementia (HCC) 07/03/2024   Anemia in chronic kidney disease 07/03/2024   Debility 06/07/2024   Osteoporosis 05/19/2022   Cataract, nuclear sclerotic, left eye 01/21/2021   Postoperative follow-up 11/18/2020   Stable treated proliferative diabetic retinopathy of right eye determined by examination associated with type 2 diabetes mellitus (HCC) 02/03/2020   Moderate nonproliferative diabetic retinopathy of left eye (HCC) 02/03/2020   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   Vitreomacular adhesion of left eye 02/03/2020   Small vessel disease, cerebrovascular    Postoperative confusion    Drug induced constipation    CKD (chronic kidney disease), stage II    Labile blood pressure    Type 2 diabetes mellitus with obesity (HCC)    Labile blood glucose    Acute on chronic anemia    Lumbar radiculopathy 06/10/2019   Lumbar adjacent segment disease with spondylolisthesis 06/05/2019   Hypersomnia with long sleep time, idiopathic 01/17/2018   Snoring 01/17/2018   Sleep apnea with hypersomnolence 01/17/2018   Excessive daytime sleepiness  01/17/2018   Normochromic normocytic anemia 05/22/2017   Radiculopathy 07/11/2012   High blood pressure 07/08/2012   Diabetes mellitus (HCC) 07/08/2012   Encephalopathy acute 07/08/2012   Tachycardia 07/08/2012   Lower urinary tract infectious disease 07/08/2012    Orientation RESPIRATION BLADDER Height & Weight     Self  Normal Incontinent, Indwelling catheter Weight: 216 lb (98 kg) Height:  5' 7 (170.2 cm)  BEHAVIORAL SYMPTOMS/MOOD NEUROLOGICAL BOWEL NUTRITION STATUS      Incontinent Diet (see d/c summary)  AMBULATORY STATUS COMMUNICATION OF NEEDS Skin   Extensive Assist Verbally Normal                       Personal Care Assistance Level of Assistance  Bathing, Feeding, Dressing Bathing Assistance: Maximum assistance Feeding assistance: Independent Dressing Assistance: Limited assistance     Functional Limitations Info  Sight, Hearing, Speech Sight Info: Impaired Hearing Info: Adequate Speech Info: Adequate    SPECIAL CARE FACTORS FREQUENCY  OT (By licensed OT), PT (By licensed PT)     PT Frequency: 5x/week OT Frequency: 5x/week            Contractures Contractures Info: Not present    Additional Factors Info  Code Status, Allergies Code Status Info: Full code Allergies Info: Sulfamethoxazole-trimethoprim, metformin , Telmisartan , Januvia (sitagliptin), Pioglitazone , Simvastatin           Current Medications (07/15/2024):  This is the current hospital active medication list Current Facility-Administered Medications  Medication Dose Route Frequency Provider Last Rate Last Admin   acetaminophen  (TYLENOL ) tablet 650 mg  650 mg Oral Q4H PRN  Curatolo, Adam, DO       amLODipine  (NORVASC ) tablet 10 mg  10 mg Oral QPC supper Curatolo, Adam, DO   10 mg at 07/14/24 1950   ampicillin  (PRINCIPEN) capsule 500 mg  500 mg Oral Once Ruthe Cornet, DO       aspirin  EC tablet 81 mg  81 mg Oral Daily Curatolo, Adam, DO   81 mg at 07/14/24 1950   fluticasone   (FLONASE ) 50 MCG/ACT nasal spray 1 spray  1 spray Each Nare Daily Curatolo, Adam, DO   1 spray at 07/14/24 1950   galantamine  (RAZADYNE  ER) 24 hr capsule 8 mg  8 mg Oral Daily Curatolo, Adam, DO       hydrALAZINE  (APRESOLINE ) tablet 50 mg  50 mg Oral Q6H Curatolo, Adam, DO   50 mg at 07/15/24 9371   melatonin tablet 3 mg  3 mg Oral QHS PRN Curatolo, Adam, DO       montelukast  (SINGULAIR ) tablet 10 mg  10 mg Oral Daily Curatolo, Adam, DO       nebivolol  (BYSTOLIC ) tablet 5 mg  5 mg Oral QHS Curatolo, Adam, DO   5 mg at 07/15/24 0113   rosuvastatin  (CRESTOR ) tablet 5 mg  5 mg Oral Daily Curatolo, Adam, DO       Current Outpatient Medications  Medication Sig Dispense Refill   acetaminophen  (TYLENOL ) 325 MG tablet Take 2 tablets (650 mg total) by mouth every 4 (four) hours as needed for mild pain (pain score 1-3).     amLODipine  (NORVASC ) 10 MG tablet Take 1 tablet (10 mg total) by mouth daily after supper. 30 tablet 0   ampicillin  (PRINCIPEN) 500 MG capsule Take 1 capsule (500 mg total) by mouth 2 (two) times daily as needed for 3 days as needed for suspect UTI. 30 capsule 3   aspirin  81 MG tablet Take 81 mg by mouth daily.     Bacillus Coagulans-Inulin (ALIGN PREBIOTIC-PROBIOTIC PO) Take by mouth.     denosumab  (PROLIA ) 60 MG/ML SOSY injection Inject 60 mg into the skin every 6 (six) months.     docusate sodium  (COLACE) 100 MG capsule Take 1 capsule (100 mg total) by mouth 2 (two) times daily. (Patient taking differently: Take 200 mg by mouth daily.) 60 capsule 0   fluticasone  (FLONASE ) 50 MCG/ACT nasal spray Spray 2 sprays into each nostril every day once daily. 16 g 3   galantamine  (RAZADYNE  ER) 8 MG 24 hr capsule Take 1 capsule (8 mg total) by mouth every morning with food. 90 capsule 3   hydrALAZINE  (APRESOLINE ) 50 MG tablet Take 1 tablet (50 mg total) by mouth every 6 (six) hours. 120 tablet 0   iron  polysaccharides (NIFEREX) 150 MG capsule Take 1 capsule (150 mg total) by mouth daily with  supper. (Patient taking differently: Take 150 mg by mouth daily as needed.) 30 capsule 0   linaclotide  (LINZESS ) 145 MCG CAPS capsule Take 1 capsule (145 mcg total) by mouth daily as needed. Per daughter, patient takes twice weekly as needed (days fluctuate from week to week)     Magnesium  200 MG TABS Take 200 mg by mouth daily.     montelukast  (SINGULAIR ) 10 MG tablet Take 1 tablet (10 mg total) by mouth daily. 90 tablet 4   Multiple Vitamin (MULTIVITAMIN) tablet Take 1 tablet by mouth every morning.     nebivolol  (BYSTOLIC ) 5 MG tablet Take 1 tablet (5 mg total) by mouth at bedtime. 30 tablet 11   rosuvastatin  (CRESTOR )  5 MG tablet Take 1 tablet (5 mg total) by mouth daily. 90 tablet 3   vitamin B-12 (CYANOCOBALAMIN ) 1000 MCG tablet Take 1,000 mcg by mouth daily. OTC     vitamin C  (ASCORBIC ACID ) 500 MG tablet Take 500 mg by mouth daily.     zinc  sulfate, 50mg  elemental zinc , 220 (50 Zn) MG capsule Take 220 mg by mouth daily.     bethanechol  (URECHOLINE ) 10 MG tablet Take 2 tablets (20 mg total) by mouth 3 (three) times daily. (Patient not taking: Reported on 07/15/2024) 180 tablet 0   glucose blood (ONETOUCH VERIO) test strip Use 1 test strip to check blood glucose once a day. 100 each 3   Lancets (ONETOUCH DELICA PLUS LANCET33G) MISC Apply 1 each topically daily.     lidocaine  (LIDODERM ) 5 % Place 1 patch onto the skin daily. Remove & Discard patch within 12 hours or as directed by MD (Patient not taking: Reported on 07/15/2024) 30 patch 0   Lidocaine -Hydrocort , Perianal, 3-0.5 % CREA Apply to rectum 2 (two) times daily as needed externally. (Patient not taking: Reported on 07/15/2024) 28.35 g 1   melatonin 3 MG TABS tablet Take 1 tablet (3 mg total) by mouth at bedtime as needed. (Patient not taking: Reported on 07/15/2024)     tamsulosin  (FLOMAX ) 0.4 MG CAPS capsule Take 2 capsules (0.8 mg total) by mouth at bedtime. (Patient not taking: Reported on 07/15/2024) 60 capsule 0     Discharge  Medications: Please see discharge summary for a list of discharge medications.  Relevant Imaging Results:  Relevant Lab Results:   Additional Information SS#[id number] [place], [place]

## 2024-07-15 NOTE — Evaluation (Signed)
 Occupational Therapy Evaluation Patient Details Name: [name] Mathews MRN: [id number] DOB: 12-03-1933 Today's Date: 07/15/2024   History of Present Illness   89 y.o. female presents to [facility] hospital on 07/14/2024 with weakness. PMH includes DM, HTN, recurrent UTI, OSA, CKD, dementia.     Clinical Impressions [name] Mathews was evaluated s/p the above admission list. She recently discharged home from rehab but was having difficulty with ADLs and mobility due to weakness. Upon evaluation the pt was limited by lethargy, baseline dementia, back pain, weakness and limited activity tolerance. Overall she needed min A for bed mobility and declined to progress OOB. Due to the deficits listed below the pt also needs up to max A for LB ADLs and min A for UB ADLs. Pt will benefit from continued acute OT services and skilled inpatient follow up therapy, <3 hours/day.      If plan is discharge home, recommend the following:   A little help with walking and/or transfers;A lot of help with bathing/dressing/bathroom;Assistance with cooking/housework;Direct supervision/assist for medications management;Direct supervision/assist for financial management;Assist for transportation;Help with stairs or ramp for entrance;Supervision due to cognitive status     Functional Status Assessment   Patient has had a recent decline in their functional status and demonstrates the ability to make significant improvements in function in a reasonable and predictable amount of time.     Equipment Recommendations   None recommended by OT      Precautions/Restrictions   Precautions Precautions: Fall Recall of Precautions/Restrictions: Intact Restrictions Weight Bearing Restrictions Per Provider Order: No     Mobility Bed Mobility Overal bed mobility: Needs Assistance Bed Mobility: Supine to Sit, Sit to Supine     Supine to sit: Min assist, HOB elevated Sit to supine: Min assist        Transfers                    General transfer comment: pt declined standing from stretcher      Balance Overall balance assessment: Needs assistance Sitting-balance support: No upper extremity supported, Feet supported Sitting balance-Leahy Scale: Fair         ADL either performed or assessed with clinical judgement   ADL Overall ADL's : Needs assistance/impaired Eating/Feeding: Set up   Grooming: Set up;Sitting   Upper Body Bathing: Set up;Sitting   Lower Body Bathing: Moderate assistance;Sit to/from stand   Upper Body Dressing : Set up;Sitting   Lower Body Dressing: Maximal assistance;Sit to/from stand   Toilet Transfer: Minimal assistance;Stand-pivot   Toileting- Clothing Manipulation and Hygiene: Total assistance       Functional mobility during ADLs: Minimal assistance General ADL Comments: increased time and cues needed for all tasks, pt was lethargic on exam     Vision Baseline Vision/History: 1 Wears glasses Vision Assessment?: No apparent visual deficits     Perception Perception: Not tested       Praxis Praxis: Not tested       Pertinent Vitals/Pain Pain Assessment Pain Assessment: Faces Faces Pain Scale: Hurts little more Pain Location: backside Pain Descriptors / Indicators: Grimacing Pain Intervention(s): Limited activity within patient's tolerance, Monitored during session     Extremity/Trunk Assessment Upper Extremity Assessment Upper Extremity Assessment: Generalized weakness   Lower Extremity Assessment Lower Extremity Assessment: Generalized weakness   Cervical / Trunk Assessment Cervical / Trunk Assessment: Kyphotic   Communication Communication Communication: No apparent difficulties   Cognition Arousal: Alert Behavior During Therapy: Flat affect Cognition: History of cognitive impairments  OT - Cognition Comments: hx of dementia, pt unable to accurately recall PLOF. evaluation limited by lethargy                  Following commands: Intact       Cueing  General Comments   Cueing Techniques: Verbal cues  VSS on RA           Home Living Family/patient expects to be discharged to:: Private residence Living Arrangements: Children Available Help at Discharge: Personal care attendant;Family;Available PRN/intermittently Type of Home: House Home Access: Stairs to enter [employer] of Steps: 2 Entrance Stairs-Rails: Can reach both Home Layout: Two level;Bed/bath upstairs Alternate Level Stairs-Number of Steps: 7 landing 7 Alternate Level Stairs-Rails: Can reach both Bathroom Shower/Tub: Producer, television/film/video: Handicapped height Bathroom Accessibility: Yes   Home Equipment: Rollator (4 wheels);Rolling Environmental consultant (2 wheels)      Lives With: Daughter    Prior Functioning/Environment Prior Level of Function : Needs assist             Mobility Comments: ambulatory with rollator, assist for stair negotiation ADLs Comments: assist with bathing/dressing    OT Problem List: Decreased strength;Decreased range of motion;Decreased activity tolerance;Impaired balance (sitting and/or standing);Decreased safety awareness;Decreased knowledge of use of DME or AE;Decreased knowledge of precautions;Pain   OT Treatment/Interventions: Self-care/ADL training;Therapeutic exercise;DME and/or AE instruction;Therapeutic activities;Patient/family education;Balance training      OT Goals(Current goals can be found in the care plan section)   Acute Rehab OT Goals Patient Stated Goal: to sleep OT Goal Formulation: With patient Time For Goal Achievement: 07/29/24 Potential to Achieve Goals: Good ADL Goals Pt Will Perform Upper Body Dressing: with modified independence Pt Will Perform Lower Body Dressing: with contact guard assist;sit to/from stand Pt Will Transfer to Toilet: with supervision;ambulating Additional ADL Goal #1: Pt will tolerate at least 8 minutes OOB activity to  demonstrate improved endurance for ADLs   OT Frequency:  Min 2X/week       AM-PAC OT 6 Clicks Daily Activity     Outcome Measure Help from another person eating meals?: None Help from another person taking care of personal grooming?: A Little Help from another person toileting, which includes using toliet, bedpan, or urinal?: A Little Help from another person bathing (including washing, rinsing, drying)?: A Lot Help from another person to put on and taking off regular upper body clothing?: A Little Help from another person to put on and taking off regular lower body clothing?: A Lot 6 Click Score: 17   End of Session Nurse Communication: Mobility status  Activity Tolerance: Patient limited by lethargy Patient left: in bed  OT Visit Diagnosis: Unsteadiness on feet (R26.81);Other abnormalities of gait and mobility (R26.89);Muscle weakness (generalized) (M62.81);Pain                Time: 8773-8759 OT Time Calculation (min): 14 min Charges:  OT General Charges $OT Visit: 1 Visit OT Evaluation $OT Eval Moderate Complexity: 1 Mod  Lucie Kendall, OTR/L Acute Rehabilitation Services Office [phone] Secure Chat Communication Preferred   Lucie JONETTA Kendall 07/15/2024, 1:07 PM

## 2024-07-15 NOTE — ED Notes (Signed)
 Daughter stated to RN I am worried about her going home without me being there particularly due to the bedroom being upstairs and my mother being unable to climb them, when able I would the social worker to call me.  Daughters cell [phone]

## 2024-07-15 NOTE — Evaluation (Signed)
 Physical Therapy Evaluation Patient Details Name: [name] Mathews MRN: [id number] DOB: [birth year] Today's Date: 07/15/2024  History of Present Illness  89 y.o. female presents to [facility] hospital on 07/14/2024 with weakness. PMH includes DM, HTN, recurrent UTI, OSA, CKD, dementia.  Clinical Impression  Pt presents to PT with deficits in strength, power, balance, gait, endurance. Pt requires assistance to perform bed mobility on stretcher, typically utilizing a railing at home which was not available during this evaluation. Pt is able to ambulate for short household distances, benefiting from verbal cues for DME management and posture. Pt will benefit from continued PT services in an effort to improve strength and activity tolerance. [name] is concerned about the pt returning home without consistent caregiver support due to weakness and falls risk. Patient will benefit from continued inpatient follow up therapy, <3 hours/day.      If plan is discharge home, recommend the following: A little help with walking and/or transfers;A lot of help with bathing/dressing/bathroom;Assistance with cooking/housework;Direct supervision/assist for medications management;Direct supervision/assist for financial management;Assist for transportation;Help with stairs or ramp for entrance;Supervision due to cognitive status   Can travel by private vehicle   Yes    Equipment Recommendations BSC/3in1  Recommendations for Other Services       Functional Status Assessment Patient has had a recent decline in their functional status and demonstrates the ability to make significant improvements in function in a reasonable and predictable amount of time.     Precautions / Restrictions Precautions Precautions: Fall Recall of Precautions/Restrictions: Intact Restrictions Weight Bearing Restrictions Per Provider Order: No      Mobility  Bed Mobility Overal bed mobility: Needs Assistance Bed Mobility: Supine to  Sit, Sit to Supine     Supine to sit: Min assist, HOB elevated Sit to supine: Contact guard assist        Transfers Overall transfer level: Needs assistance Equipment used: Rolling walker (2 wheels) Transfers: Sit to/from Stand Sit to Stand: Contact guard assist, From elevated surface                Ambulation/Gait Ambulation/Gait assistance: Contact guard assist Gait Distance (Feet): 25 Feet (25' x 2 trials) Assistive device: Rolling walker (2 wheels) Gait Pattern/deviations: Step-through pattern, Trunk flexed Gait velocity: reduced Gait velocity interpretation: <1.31 ft/sec, indicative of household ambulator   General Gait Details: slowed step-through gait, increased trunk flexion over RW which pt temporarily corrects with verbal cues  Stairs            Wheelchair Mobility     Tilt Bed    Modified Rankin (Stroke Patients Only)       Balance Overall balance assessment: Needs assistance Sitting-balance support: No upper extremity supported, Feet supported Sitting balance-Leahy Scale: Fair     Standing balance support: Bilateral upper extremity supported, Reliant on assistive device for balance Standing balance-Leahy Scale: Poor                               Pertinent Vitals/Pain Pain Assessment Pain Assessment: Faces Faces Pain Scale: Hurts little more Pain Location: low back Pain Descriptors / Indicators: Grimacing Pain Intervention(s): Monitored during session    Home Living Family/patient expects to be discharged to:: Private residence Living Arrangements: Children Available Help at Discharge: Personal care attendant;Family;Available PRN/intermittently (PCA 9A-4P daily) Type of Home: House Home Access: Stairs to enter Entrance Stairs-Rails: Can reach both Entrance Stairs-Number of Steps: 2 Alternate Level Stairs-Number of Steps: 7  landing 7 Home Layout: Two level;Bed/bath upstairs Home Equipment: Rollator (4 wheels);Rolling  Walker (2 wheels)      Prior Function Prior Level of Function : Needs assist             Mobility Comments: ambulatory with rollator, assist for stair negotiation ADLs Comments: assist with bathing/dressing     Extremity/Trunk Assessment   Upper Extremity Assessment Upper Extremity Assessment: Generalized weakness    Lower Extremity Assessment Lower Extremity Assessment: Generalized weakness    Cervical / Trunk Assessment Cervical / Trunk Assessment: Kyphotic  Communication   Communication Communication: No apparent difficulties    Cognition Arousal: Alert Behavior During Therapy: Flat affect   PT - Cognitive impairments: Problem solving                         Following commands: Intact       Cueing Cueing Techniques: Verbal cues     General Comments General comments (skin integrity, edema, etc.): VSS on RA, SpO2 96%    Exercises     Assessment/Plan    PT Assessment Patient needs continued PT services  PT Problem List Decreased strength;Decreased activity tolerance;Decreased balance;Decreased mobility;Decreased knowledge of use of DME       PT Treatment Interventions DME instruction;Gait training;Stair training;Functional mobility training;Therapeutic activities;Therapeutic exercise;Neuromuscular re-education;Balance training;Patient/family education    PT Goals (Current goals can be found in the Care Plan section)  Acute Rehab PT Goals Patient Stated Goal: to obtain 24/7 aide services prior to return home, also improve patient strength PT Goal Formulation: With patient/family Time For Goal Achievement: 07/29/24 Potential to Achieve Goals: Fair    Frequency Min 2X/week     Co-evaluation               AM-PAC PT 6 Clicks Mobility  Outcome Measure Help needed turning from your back to your side while in a flat bed without using bedrails?: A Little Help needed moving from lying on your back to sitting on the side of a flat bed  without using bedrails?: A Little Help needed moving to and from a bed to a chair (including a wheelchair)?: A Little Help needed standing up from a chair using your arms (e.g., wheelchair or bedside chair)?: A Little Help needed to walk in hospital room?: A Little Help needed climbing 3-5 steps with a railing? : A Lot 6 Click Score: 17    End of Session Equipment Utilized During Treatment: Gait belt Activity Tolerance: Patient tolerated treatment well Patient left: in bed;with call bell/phone within reach;with family/visitor present Nurse Communication: Mobility status PT Visit Diagnosis: Other abnormalities of gait and mobility (R26.89);Muscle weakness (generalized) (M62.81)    Time: 9141-9067 PT Time Calculation (min) (ACUTE ONLY): 34 min   Charges:   PT Evaluation $PT Eval Low Complexity: 1 Low PT Treatments $Gait Training: 8-22 mins PT General Charges $$ ACUTE PT VISIT: 1 Visit         [name] Mathews, PT, DPT Acute Rehabilitation Office [phone]   [name] Mathews 07/15/2024, 9:48 AM

## 2024-07-16 DIAGNOSIS — M810 Age-related osteoporosis without current pathological fracture: Secondary | ICD-10-CM | POA: Diagnosis not present

## 2024-07-16 DIAGNOSIS — D631 Anemia in chronic kidney disease: Secondary | ICD-10-CM | POA: Diagnosis not present

## 2024-07-16 DIAGNOSIS — G4711 Idiopathic hypersomnia with long sleep time: Secondary | ICD-10-CM | POA: Diagnosis not present

## 2024-07-16 DIAGNOSIS — R1084 Generalized abdominal pain: Secondary | ICD-10-CM | POA: Diagnosis not present

## 2024-07-16 DIAGNOSIS — N189 Chronic kidney disease, unspecified: Secondary | ICD-10-CM | POA: Diagnosis not present

## 2024-07-16 DIAGNOSIS — E113551 Type 2 diabetes mellitus with stable proliferative diabetic retinopathy, right eye: Secondary | ICD-10-CM | POA: Diagnosis not present

## 2024-07-16 DIAGNOSIS — H2512 Age-related nuclear cataract, left eye: Secondary | ICD-10-CM | POA: Diagnosis not present

## 2024-07-16 DIAGNOSIS — D649 Anemia, unspecified: Secondary | ICD-10-CM | POA: Diagnosis not present

## 2024-07-16 DIAGNOSIS — R41841 Cognitive communication deficit: Secondary | ICD-10-CM | POA: Diagnosis not present

## 2024-07-16 DIAGNOSIS — N320 Bladder-neck obstruction: Secondary | ICD-10-CM | POA: Diagnosis not present

## 2024-07-16 DIAGNOSIS — R627 Adult failure to thrive: Secondary | ICD-10-CM | POA: Diagnosis not present

## 2024-07-16 DIAGNOSIS — M6281 Muscle weakness (generalized): Secondary | ICD-10-CM | POA: Diagnosis not present

## 2024-07-16 DIAGNOSIS — I129 Hypertensive chronic kidney disease with stage 1 through stage 4 chronic kidney disease, or unspecified chronic kidney disease: Secondary | ICD-10-CM | POA: Diagnosis not present

## 2024-07-16 DIAGNOSIS — R2681 Unsteadiness on feet: Secondary | ICD-10-CM | POA: Diagnosis not present

## 2024-07-16 DIAGNOSIS — E785 Hyperlipidemia, unspecified: Secondary | ICD-10-CM | POA: Diagnosis not present

## 2024-07-16 DIAGNOSIS — R2689 Other abnormalities of gait and mobility: Secondary | ICD-10-CM | POA: Diagnosis not present

## 2024-07-16 DIAGNOSIS — Z741 Need for assistance with personal care: Secondary | ICD-10-CM | POA: Diagnosis not present

## 2024-07-16 DIAGNOSIS — H4321 Crystalline deposits in vitreous body, right eye: Secondary | ICD-10-CM | POA: Diagnosis not present

## 2024-07-16 DIAGNOSIS — E1122 Type 2 diabetes mellitus with diabetic chronic kidney disease: Secondary | ICD-10-CM | POA: Diagnosis not present

## 2024-07-16 DIAGNOSIS — M169 Osteoarthritis of hip, unspecified: Secondary | ICD-10-CM | POA: Diagnosis not present

## 2024-07-16 DIAGNOSIS — F0390 Unspecified dementia without behavioral disturbance: Secondary | ICD-10-CM | POA: Diagnosis not present

## 2024-07-16 DIAGNOSIS — I10 Essential (primary) hypertension: Secondary | ICD-10-CM | POA: Diagnosis not present

## 2024-07-16 DIAGNOSIS — M6259 Muscle wasting and atrophy, not elsewhere classified, multiple sites: Secondary | ICD-10-CM | POA: Diagnosis not present

## 2024-07-16 DIAGNOSIS — K5909 Other constipation: Secondary | ICD-10-CM | POA: Diagnosis not present

## 2024-07-16 DIAGNOSIS — I679 Cerebrovascular disease, unspecified: Secondary | ICD-10-CM | POA: Diagnosis not present

## 2024-07-16 DIAGNOSIS — E43 Unspecified severe protein-calorie malnutrition: Secondary | ICD-10-CM | POA: Diagnosis not present

## 2024-07-16 DIAGNOSIS — R339 Retention of urine, unspecified: Secondary | ICD-10-CM | POA: Diagnosis not present

## 2024-07-16 DIAGNOSIS — N3000 Acute cystitis without hematuria: Secondary | ICD-10-CM | POA: Diagnosis not present

## 2024-07-16 NOTE — ED Notes (Signed)
 Pt daughter called, provided update awaiting pt daughter paperwork from [facility]

## 2024-07-16 NOTE — ED Notes (Signed)
 Pt daughter arrived to bedside, brought WC to room and emptied pt foley

## 2024-07-16 NOTE — ED Notes (Signed)
 Pt verbalizes understanding of DC instructions. Pt belongings returned and is assisted in [facility] out of ED with daughter.

## 2024-07-16 NOTE — Discharge Instructions (Signed)
 You were retaining some urine here in the emergency department and a Foley catheter was placed.  You will need follow-up with a urologist to have this removed.

## 2024-07-16 NOTE — TOC Progression Note (Signed)
 Transition of Care [facility]) - Progression Note    Patient Details  Name: [name] Mathews MRN: [id number] Date of Birth: [birth year]  [facility]) CM/SW Contact  Luann SHAUNNA [place], [place] Phone Number: 07/16/2024, 11:35 AM  Clinical Narrative:      PT to DC to [facility] today. Spoke with pt's daughter who will complete paperwork at [facility] at 1145a. She prefers to transport pt herself and will come to hospital after finishing paperwork. CSW sent AVS to [facility]. RN to call report to [phone] .                    Expected Discharge Plan and Services                                               Social Drivers of Health (SDOH) Interventions SDOH Screenings   Food Insecurity: No Food Insecurity (04/30/2024)  Housing: Unknown (04/30/2024)  Transportation Needs: No Transportation Needs (04/30/2024)  Utilities: Not At Risk (04/30/2024)  Depression (PHQ2-9): Low Risk  (04/30/2024)  Tobacco Use: Low Risk  (07/14/2024)    Readmission Risk Interventions     No data to display

## 2024-07-16 NOTE — ED Notes (Signed)
 Per Lenor MD plan to DC pt with foley and follow up care with urology

## 2024-07-16 NOTE — ED Notes (Signed)
 Unable to send meds at this time waiting for pharmacy to send

## 2024-07-16 NOTE — ED Notes (Signed)
 Hourly rounding complete. Pt alert or resting, no distress noted, offered toileting and diet as appropriate. Side rails up, call light within reach. Pt denies pain or further needs at this time.

## 2024-07-16 NOTE — ED Provider Notes (Addendum)
 Emergency Medicine Observation Re-evaluation Note  [name] Mathews is a 89 y.o. female, seen on rounds today.  Pt initially presented to the ED for complaints of Weakness and Recurrent UTI Currently, the patient is sleeping.  Physical Exam  BP (!) 149/57   Pulse (!) 51   Temp 98 F (36.7 C)   Resp 17   Ht 5' 7 (1.702 m)   Wt 98 kg   SpO2 100%   BMI 33.83 kg/m  Physical Exam General: No acute distress Cardiac: Normal rate Lungs: No increased work of breathing Psych: Calm  ED Course / MDM  EKG:EKG Interpretation Date/Time:  Sunday July 14 2024 15:44:25 EDT Ventricular Rate:  50 PR Interval:  190 QRS Duration:  139 QT Interval:  520 QTC Calculation: 475 R Axis:   -4  Text Interpretation: Sinus rhythm Right bundle branch block Confirmed by Ruthe Cornet [phone]) on 07/14/2024 3:52:27 PM  I have reviewed the labs performed to date as well as medications administered while in observation.  Recent changes in the last 24 hours include none.  Plan  Current plan is for nursing home placement, potentially will be admitted to [facility].  Patient has been cleared to go to [place] nursing facility today.    Lenor Hollering, MD 07/16/24 0830    Lenor Hollering, MD 07/16/24 [phone]

## 2024-07-16 NOTE — ED Notes (Signed)
 Called report to facility at [facility] [phone]

## 2024-07-17 DIAGNOSIS — R339 Retention of urine, unspecified: Secondary | ICD-10-CM | POA: Diagnosis not present

## 2024-07-17 DIAGNOSIS — R2689 Other abnormalities of gait and mobility: Secondary | ICD-10-CM | POA: Diagnosis not present

## 2024-07-17 DIAGNOSIS — E785 Hyperlipidemia, unspecified: Secondary | ICD-10-CM | POA: Diagnosis not present

## 2024-07-17 DIAGNOSIS — N3000 Acute cystitis without hematuria: Secondary | ICD-10-CM | POA: Diagnosis not present

## 2024-07-17 DIAGNOSIS — I10 Essential (primary) hypertension: Secondary | ICD-10-CM | POA: Diagnosis not present

## 2024-07-17 DIAGNOSIS — M6259 Muscle wasting and atrophy, not elsewhere classified, multiple sites: Secondary | ICD-10-CM | POA: Diagnosis not present

## 2024-07-17 DIAGNOSIS — K5909 Other constipation: Secondary | ICD-10-CM | POA: Diagnosis not present

## 2024-07-17 DIAGNOSIS — F0390 Unspecified dementia without behavioral disturbance: Secondary | ICD-10-CM | POA: Diagnosis not present

## 2024-07-17 DIAGNOSIS — M6281 Muscle weakness (generalized): Secondary | ICD-10-CM | POA: Diagnosis not present

## 2024-07-19 DIAGNOSIS — D649 Anemia, unspecified: Secondary | ICD-10-CM | POA: Diagnosis not present

## 2024-07-19 LAB — CULTURE, BLOOD (ROUTINE X 2)
Culture: NO GROWTH
Culture: NO GROWTH
Special Requests: ADEQUATE
Special Requests: ADEQUATE

## 2024-07-23 DIAGNOSIS — E785 Hyperlipidemia, unspecified: Secondary | ICD-10-CM | POA: Diagnosis not present

## 2024-07-23 DIAGNOSIS — I10 Essential (primary) hypertension: Secondary | ICD-10-CM | POA: Diagnosis not present

## 2024-07-23 DIAGNOSIS — F0390 Unspecified dementia without behavioral disturbance: Secondary | ICD-10-CM | POA: Diagnosis not present

## 2024-07-23 DIAGNOSIS — R339 Retention of urine, unspecified: Secondary | ICD-10-CM | POA: Diagnosis not present

## 2024-07-26 DIAGNOSIS — N390 Urinary tract infection, site not specified: Secondary | ICD-10-CM | POA: Diagnosis not present

## 2024-07-26 DIAGNOSIS — E113551 Type 2 diabetes mellitus with stable proliferative diabetic retinopathy, right eye: Secondary | ICD-10-CM | POA: Diagnosis not present

## 2024-07-26 DIAGNOSIS — E113392 Type 2 diabetes mellitus with moderate nonproliferative diabetic retinopathy without macular edema, left eye: Secondary | ICD-10-CM | POA: Diagnosis not present

## 2024-07-26 DIAGNOSIS — E1169 Type 2 diabetes mellitus with other specified complication: Secondary | ICD-10-CM | POA: Diagnosis not present

## 2024-07-26 DIAGNOSIS — E1151 Type 2 diabetes mellitus with diabetic peripheral angiopathy without gangrene: Secondary | ICD-10-CM | POA: Diagnosis not present

## 2024-07-26 DIAGNOSIS — E669 Obesity, unspecified: Secondary | ICD-10-CM | POA: Diagnosis not present

## 2024-07-29 DIAGNOSIS — N390 Urinary tract infection, site not specified: Secondary | ICD-10-CM | POA: Diagnosis not present

## 2024-07-29 DIAGNOSIS — E1169 Type 2 diabetes mellitus with other specified complication: Secondary | ICD-10-CM | POA: Diagnosis not present

## 2024-07-29 DIAGNOSIS — E669 Obesity, unspecified: Secondary | ICD-10-CM | POA: Diagnosis not present

## 2024-07-29 DIAGNOSIS — E113392 Type 2 diabetes mellitus with moderate nonproliferative diabetic retinopathy without macular edema, left eye: Secondary | ICD-10-CM | POA: Diagnosis not present

## 2024-07-29 DIAGNOSIS — E1151 Type 2 diabetes mellitus with diabetic peripheral angiopathy without gangrene: Secondary | ICD-10-CM | POA: Diagnosis not present

## 2024-07-29 DIAGNOSIS — E113551 Type 2 diabetes mellitus with stable proliferative diabetic retinopathy, right eye: Secondary | ICD-10-CM | POA: Diagnosis not present

## 2024-07-30 ENCOUNTER — Other Ambulatory Visit: Payer: Self-pay

## 2024-07-30 ENCOUNTER — Other Ambulatory Visit: Payer: Self-pay | Admitting: Physical Medicine and Rehabilitation

## 2024-07-30 ENCOUNTER — Other Ambulatory Visit (HOSPITAL_BASED_OUTPATIENT_CLINIC_OR_DEPARTMENT_OTHER): Payer: Self-pay

## 2024-07-30 DIAGNOSIS — M8580 Other specified disorders of bone density and structure, unspecified site: Secondary | ICD-10-CM | POA: Diagnosis not present

## 2024-07-30 DIAGNOSIS — F03B0 Unspecified dementia, moderate, without behavioral disturbance, psychotic disturbance, mood disturbance, and anxiety: Secondary | ICD-10-CM | POA: Diagnosis not present

## 2024-07-30 DIAGNOSIS — N390 Urinary tract infection, site not specified: Secondary | ICD-10-CM | POA: Diagnosis not present

## 2024-07-30 DIAGNOSIS — R5381 Other malaise: Secondary | ICD-10-CM | POA: Diagnosis not present

## 2024-07-30 DIAGNOSIS — E1122 Type 2 diabetes mellitus with diabetic chronic kidney disease: Secondary | ICD-10-CM | POA: Diagnosis not present

## 2024-07-30 DIAGNOSIS — E785 Hyperlipidemia, unspecified: Secondary | ICD-10-CM | POA: Diagnosis not present

## 2024-07-30 DIAGNOSIS — D631 Anemia in chronic kidney disease: Secondary | ICD-10-CM | POA: Diagnosis not present

## 2024-07-30 DIAGNOSIS — K219 Gastro-esophageal reflux disease without esophagitis: Secondary | ICD-10-CM | POA: Diagnosis not present

## 2024-07-30 DIAGNOSIS — Z23 Encounter for immunization: Secondary | ICD-10-CM | POA: Diagnosis not present

## 2024-07-30 DIAGNOSIS — N184 Chronic kidney disease, stage 4 (severe): Secondary | ICD-10-CM | POA: Diagnosis not present

## 2024-07-30 DIAGNOSIS — I129 Hypertensive chronic kidney disease with stage 1 through stage 4 chronic kidney disease, or unspecified chronic kidney disease: Secondary | ICD-10-CM | POA: Diagnosis not present

## 2024-07-30 DIAGNOSIS — D696 Thrombocytopenia, unspecified: Secondary | ICD-10-CM | POA: Diagnosis not present

## 2024-07-30 MED ORDER — TAMSULOSIN HCL 0.4 MG PO CAPS
0.4000 mg | ORAL_CAPSULE | Freq: Every evening | ORAL | 3 refills | Status: AC
  Filled 2024-07-30: qty 90, 90d supply, fill #0
  Filled 2024-10-29: qty 90, 90d supply, fill #1

## 2024-07-30 MED ORDER — DIVALPROEX SODIUM 125 MG PO CSDR
125.0000 mg | DELAYED_RELEASE_CAPSULE | Freq: Every day | ORAL | 5 refills | Status: AC | PRN
  Filled 2024-07-30: qty 30, 30d supply, fill #0
  Filled 2024-08-24: qty 30, 30d supply, fill #1

## 2024-07-30 MED ORDER — SERTRALINE HCL 25 MG PO TABS
25.0000 mg | ORAL_TABLET | Freq: Every day | ORAL | 11 refills | Status: DC
  Filled 2024-07-30: qty 30, 30d supply, fill #0
  Filled 2024-08-24 (×2): qty 30, 30d supply, fill #1

## 2024-07-30 MED ORDER — TAMSULOSIN HCL 0.4 MG PO CAPS
0.4000 mg | ORAL_CAPSULE | Freq: Every day | ORAL | 0 refills | Status: AC
  Filled 2024-07-30: qty 30, 30d supply, fill #0

## 2024-07-31 ENCOUNTER — Other Ambulatory Visit (HOSPITAL_BASED_OUTPATIENT_CLINIC_OR_DEPARTMENT_OTHER): Payer: Self-pay

## 2024-07-31 ENCOUNTER — Other Ambulatory Visit: Payer: Self-pay

## 2024-07-31 MED ORDER — GALANTAMINE HYDROBROMIDE ER 16 MG PO CP24
16.0000 mg | ORAL_CAPSULE | Freq: Every day | ORAL | 1 refills | Status: AC
  Filled 2024-07-31: qty 30, 30d supply, fill #0
  Filled 2024-09-20: qty 30, 30d supply, fill #1

## 2024-08-01 ENCOUNTER — Other Ambulatory Visit (HOSPITAL_BASED_OUTPATIENT_CLINIC_OR_DEPARTMENT_OTHER): Payer: Self-pay

## 2024-08-01 ENCOUNTER — Other Ambulatory Visit: Payer: Self-pay

## 2024-08-01 DIAGNOSIS — M4727 Other spondylosis with radiculopathy, lumbosacral region: Secondary | ICD-10-CM | POA: Diagnosis not present

## 2024-08-01 DIAGNOSIS — D631 Anemia in chronic kidney disease: Secondary | ICD-10-CM | POA: Diagnosis not present

## 2024-08-01 DIAGNOSIS — I129 Hypertensive chronic kidney disease with stage 1 through stage 4 chronic kidney disease, or unspecified chronic kidney disease: Secondary | ICD-10-CM | POA: Diagnosis not present

## 2024-08-01 DIAGNOSIS — R159 Full incontinence of feces: Secondary | ICD-10-CM | POA: Diagnosis not present

## 2024-08-01 DIAGNOSIS — E113551 Type 2 diabetes mellitus with stable proliferative diabetic retinopathy, right eye: Secondary | ICD-10-CM | POA: Diagnosis not present

## 2024-08-01 DIAGNOSIS — N390 Urinary tract infection, site not specified: Secondary | ICD-10-CM | POA: Diagnosis not present

## 2024-08-01 DIAGNOSIS — R339 Retention of urine, unspecified: Secondary | ICD-10-CM | POA: Diagnosis not present

## 2024-08-01 DIAGNOSIS — Z860100 Personal history of colon polyps, unspecified: Secondary | ICD-10-CM | POA: Diagnosis not present

## 2024-08-01 DIAGNOSIS — E1169 Type 2 diabetes mellitus with other specified complication: Secondary | ICD-10-CM | POA: Diagnosis not present

## 2024-08-01 DIAGNOSIS — E113392 Type 2 diabetes mellitus with moderate nonproliferative diabetic retinopathy without macular edema, left eye: Secondary | ICD-10-CM | POA: Diagnosis not present

## 2024-08-01 DIAGNOSIS — M810 Age-related osteoporosis without current pathological fracture: Secondary | ICD-10-CM | POA: Diagnosis not present

## 2024-08-01 DIAGNOSIS — E1136 Type 2 diabetes mellitus with diabetic cataract: Secondary | ICD-10-CM | POA: Diagnosis not present

## 2024-08-01 DIAGNOSIS — G4710 Hypersomnia, unspecified: Secondary | ICD-10-CM | POA: Diagnosis not present

## 2024-08-01 DIAGNOSIS — M5030 Other cervical disc degeneration, unspecified cervical region: Secondary | ICD-10-CM | POA: Diagnosis not present

## 2024-08-01 DIAGNOSIS — H3530 Unspecified macular degeneration: Secondary | ICD-10-CM | POA: Diagnosis not present

## 2024-08-01 DIAGNOSIS — E669 Obesity, unspecified: Secondary | ICD-10-CM | POA: Diagnosis not present

## 2024-08-01 DIAGNOSIS — N3281 Overactive bladder: Secondary | ICD-10-CM | POA: Diagnosis not present

## 2024-08-01 DIAGNOSIS — M160 Bilateral primary osteoarthritis of hip: Secondary | ICD-10-CM | POA: Diagnosis not present

## 2024-08-01 DIAGNOSIS — E1122 Type 2 diabetes mellitus with diabetic chronic kidney disease: Secondary | ICD-10-CM | POA: Diagnosis not present

## 2024-08-01 DIAGNOSIS — F03A18 Unspecified dementia, mild, with other behavioral disturbance: Secondary | ICD-10-CM | POA: Diagnosis not present

## 2024-08-01 DIAGNOSIS — E1151 Type 2 diabetes mellitus with diabetic peripheral angiopathy without gangrene: Secondary | ICD-10-CM | POA: Diagnosis not present

## 2024-08-01 DIAGNOSIS — Z792 Long term (current) use of antibiotics: Secondary | ICD-10-CM | POA: Diagnosis not present

## 2024-08-01 DIAGNOSIS — Z6833 Body mass index (BMI) 33.0-33.9, adult: Secondary | ICD-10-CM | POA: Diagnosis not present

## 2024-08-01 DIAGNOSIS — E785 Hyperlipidemia, unspecified: Secondary | ICD-10-CM | POA: Diagnosis not present

## 2024-08-01 DIAGNOSIS — N184 Chronic kidney disease, stage 4 (severe): Secondary | ICD-10-CM | POA: Diagnosis not present

## 2024-08-02 DIAGNOSIS — E1151 Type 2 diabetes mellitus with diabetic peripheral angiopathy without gangrene: Secondary | ICD-10-CM | POA: Diagnosis not present

## 2024-08-02 DIAGNOSIS — E113551 Type 2 diabetes mellitus with stable proliferative diabetic retinopathy, right eye: Secondary | ICD-10-CM | POA: Diagnosis not present

## 2024-08-02 DIAGNOSIS — N390 Urinary tract infection, site not specified: Secondary | ICD-10-CM | POA: Diagnosis not present

## 2024-08-02 DIAGNOSIS — E113392 Type 2 diabetes mellitus with moderate nonproliferative diabetic retinopathy without macular edema, left eye: Secondary | ICD-10-CM | POA: Diagnosis not present

## 2024-08-02 DIAGNOSIS — E1122 Type 2 diabetes mellitus with diabetic chronic kidney disease: Secondary | ICD-10-CM | POA: Diagnosis not present

## 2024-08-02 DIAGNOSIS — I129 Hypertensive chronic kidney disease with stage 1 through stage 4 chronic kidney disease, or unspecified chronic kidney disease: Secondary | ICD-10-CM | POA: Diagnosis not present

## 2024-08-08 ENCOUNTER — Other Ambulatory Visit (HOSPITAL_BASED_OUTPATIENT_CLINIC_OR_DEPARTMENT_OTHER): Payer: Self-pay

## 2024-08-08 DIAGNOSIS — G8929 Other chronic pain: Secondary | ICD-10-CM | POA: Diagnosis not present

## 2024-08-08 DIAGNOSIS — E113551 Type 2 diabetes mellitus with stable proliferative diabetic retinopathy, right eye: Secondary | ICD-10-CM | POA: Diagnosis not present

## 2024-08-08 DIAGNOSIS — J309 Allergic rhinitis, unspecified: Secondary | ICD-10-CM | POA: Diagnosis not present

## 2024-08-08 DIAGNOSIS — I10 Essential (primary) hypertension: Secondary | ICD-10-CM | POA: Diagnosis not present

## 2024-08-08 DIAGNOSIS — M545 Low back pain, unspecified: Secondary | ICD-10-CM | POA: Diagnosis not present

## 2024-08-08 DIAGNOSIS — M810 Age-related osteoporosis without current pathological fracture: Secondary | ICD-10-CM | POA: Diagnosis not present

## 2024-08-08 DIAGNOSIS — H3530 Unspecified macular degeneration: Secondary | ICD-10-CM | POA: Diagnosis not present

## 2024-08-08 DIAGNOSIS — M5030 Other cervical disc degeneration, unspecified cervical region: Secondary | ICD-10-CM | POA: Diagnosis not present

## 2024-08-08 DIAGNOSIS — E119 Type 2 diabetes mellitus without complications: Secondary | ICD-10-CM | POA: Diagnosis not present

## 2024-08-08 DIAGNOSIS — D649 Anemia, unspecified: Secondary | ICD-10-CM | POA: Diagnosis not present

## 2024-08-08 DIAGNOSIS — N390 Urinary tract infection, site not specified: Secondary | ICD-10-CM | POA: Diagnosis not present

## 2024-08-08 DIAGNOSIS — I129 Hypertensive chronic kidney disease with stage 1 through stage 4 chronic kidney disease, or unspecified chronic kidney disease: Secondary | ICD-10-CM | POA: Diagnosis not present

## 2024-08-08 DIAGNOSIS — E1151 Type 2 diabetes mellitus with diabetic peripheral angiopathy without gangrene: Secondary | ICD-10-CM | POA: Diagnosis not present

## 2024-08-08 DIAGNOSIS — M1990 Unspecified osteoarthritis, unspecified site: Secondary | ICD-10-CM | POA: Diagnosis not present

## 2024-08-08 DIAGNOSIS — E785 Hyperlipidemia, unspecified: Secondary | ICD-10-CM | POA: Diagnosis not present

## 2024-08-08 DIAGNOSIS — E113392 Type 2 diabetes mellitus with moderate nonproliferative diabetic retinopathy without macular edema, left eye: Secondary | ICD-10-CM | POA: Diagnosis not present

## 2024-08-08 DIAGNOSIS — E1122 Type 2 diabetes mellitus with diabetic chronic kidney disease: Secondary | ICD-10-CM | POA: Diagnosis not present

## 2024-08-08 DIAGNOSIS — F0390 Unspecified dementia without behavioral disturbance: Secondary | ICD-10-CM | POA: Diagnosis not present

## 2024-08-09 ENCOUNTER — Other Ambulatory Visit (HOSPITAL_BASED_OUTPATIENT_CLINIC_OR_DEPARTMENT_OTHER): Payer: Self-pay

## 2024-08-09 ENCOUNTER — Other Ambulatory Visit: Payer: Self-pay

## 2024-08-12 DIAGNOSIS — E113551 Type 2 diabetes mellitus with stable proliferative diabetic retinopathy, right eye: Secondary | ICD-10-CM | POA: Diagnosis not present

## 2024-08-12 DIAGNOSIS — E1122 Type 2 diabetes mellitus with diabetic chronic kidney disease: Secondary | ICD-10-CM | POA: Diagnosis not present

## 2024-08-12 DIAGNOSIS — N390 Urinary tract infection, site not specified: Secondary | ICD-10-CM | POA: Diagnosis not present

## 2024-08-12 DIAGNOSIS — I129 Hypertensive chronic kidney disease with stage 1 through stage 4 chronic kidney disease, or unspecified chronic kidney disease: Secondary | ICD-10-CM | POA: Diagnosis not present

## 2024-08-12 DIAGNOSIS — E113392 Type 2 diabetes mellitus with moderate nonproliferative diabetic retinopathy without macular edema, left eye: Secondary | ICD-10-CM | POA: Diagnosis not present

## 2024-08-12 DIAGNOSIS — E1151 Type 2 diabetes mellitus with diabetic peripheral angiopathy without gangrene: Secondary | ICD-10-CM | POA: Diagnosis not present

## 2024-08-15 DIAGNOSIS — D649 Anemia, unspecified: Secondary | ICD-10-CM | POA: Diagnosis not present

## 2024-08-15 DIAGNOSIS — M8589 Other specified disorders of bone density and structure, multiple sites: Secondary | ICD-10-CM | POA: Diagnosis not present

## 2024-08-15 DIAGNOSIS — N390 Urinary tract infection, site not specified: Secondary | ICD-10-CM | POA: Diagnosis not present

## 2024-08-15 DIAGNOSIS — F03B0 Unspecified dementia, moderate, without behavioral disturbance, psychotic disturbance, mood disturbance, and anxiety: Secondary | ICD-10-CM | POA: Diagnosis not present

## 2024-08-15 DIAGNOSIS — R5381 Other malaise: Secondary | ICD-10-CM | POA: Diagnosis not present

## 2024-08-15 DIAGNOSIS — R600 Localized edema: Secondary | ICD-10-CM | POA: Diagnosis not present

## 2024-08-15 DIAGNOSIS — N184 Chronic kidney disease, stage 4 (severe): Secondary | ICD-10-CM | POA: Diagnosis not present

## 2024-08-15 DIAGNOSIS — R339 Retention of urine, unspecified: Secondary | ICD-10-CM | POA: Diagnosis not present

## 2024-08-15 DIAGNOSIS — E1169 Type 2 diabetes mellitus with other specified complication: Secondary | ICD-10-CM | POA: Diagnosis not present

## 2024-08-15 DIAGNOSIS — M160 Bilateral primary osteoarthritis of hip: Secondary | ICD-10-CM | POA: Diagnosis not present

## 2024-08-15 DIAGNOSIS — E1122 Type 2 diabetes mellitus with diabetic chronic kidney disease: Secondary | ICD-10-CM | POA: Diagnosis not present

## 2024-08-15 DIAGNOSIS — I129 Hypertensive chronic kidney disease with stage 1 through stage 4 chronic kidney disease, or unspecified chronic kidney disease: Secondary | ICD-10-CM | POA: Diagnosis not present

## 2024-08-15 DIAGNOSIS — D631 Anemia in chronic kidney disease: Secondary | ICD-10-CM | POA: Diagnosis not present

## 2024-08-16 NOTE — Progress Notes (Signed)
 ok

## 2024-08-20 DIAGNOSIS — E1122 Type 2 diabetes mellitus with diabetic chronic kidney disease: Secondary | ICD-10-CM | POA: Diagnosis not present

## 2024-08-20 DIAGNOSIS — I129 Hypertensive chronic kidney disease with stage 1 through stage 4 chronic kidney disease, or unspecified chronic kidney disease: Secondary | ICD-10-CM | POA: Diagnosis not present

## 2024-08-20 DIAGNOSIS — E113392 Type 2 diabetes mellitus with moderate nonproliferative diabetic retinopathy without macular edema, left eye: Secondary | ICD-10-CM | POA: Diagnosis not present

## 2024-08-20 DIAGNOSIS — N390 Urinary tract infection, site not specified: Secondary | ICD-10-CM | POA: Diagnosis not present

## 2024-08-20 DIAGNOSIS — E113551 Type 2 diabetes mellitus with stable proliferative diabetic retinopathy, right eye: Secondary | ICD-10-CM | POA: Diagnosis not present

## 2024-08-20 DIAGNOSIS — E1151 Type 2 diabetes mellitus with diabetic peripheral angiopathy without gangrene: Secondary | ICD-10-CM | POA: Diagnosis not present

## 2024-08-24 ENCOUNTER — Other Ambulatory Visit (HOSPITAL_BASED_OUTPATIENT_CLINIC_OR_DEPARTMENT_OTHER): Payer: Self-pay

## 2024-08-26 DIAGNOSIS — E1122 Type 2 diabetes mellitus with diabetic chronic kidney disease: Secondary | ICD-10-CM | POA: Diagnosis not present

## 2024-08-26 DIAGNOSIS — E1151 Type 2 diabetes mellitus with diabetic peripheral angiopathy without gangrene: Secondary | ICD-10-CM | POA: Diagnosis not present

## 2024-08-26 DIAGNOSIS — E113551 Type 2 diabetes mellitus with stable proliferative diabetic retinopathy, right eye: Secondary | ICD-10-CM | POA: Diagnosis not present

## 2024-08-26 DIAGNOSIS — N390 Urinary tract infection, site not specified: Secondary | ICD-10-CM | POA: Diagnosis not present

## 2024-08-26 DIAGNOSIS — E113392 Type 2 diabetes mellitus with moderate nonproliferative diabetic retinopathy without macular edema, left eye: Secondary | ICD-10-CM | POA: Diagnosis not present

## 2024-08-26 DIAGNOSIS — I129 Hypertensive chronic kidney disease with stage 1 through stage 4 chronic kidney disease, or unspecified chronic kidney disease: Secondary | ICD-10-CM | POA: Diagnosis not present

## 2024-08-27 ENCOUNTER — Other Ambulatory Visit: Payer: Self-pay

## 2024-08-29 DIAGNOSIS — E1151 Type 2 diabetes mellitus with diabetic peripheral angiopathy without gangrene: Secondary | ICD-10-CM | POA: Diagnosis not present

## 2024-08-29 DIAGNOSIS — E1122 Type 2 diabetes mellitus with diabetic chronic kidney disease: Secondary | ICD-10-CM | POA: Diagnosis not present

## 2024-08-29 DIAGNOSIS — E113551 Type 2 diabetes mellitus with stable proliferative diabetic retinopathy, right eye: Secondary | ICD-10-CM | POA: Diagnosis not present

## 2024-08-29 DIAGNOSIS — N390 Urinary tract infection, site not specified: Secondary | ICD-10-CM | POA: Diagnosis not present

## 2024-08-29 DIAGNOSIS — I129 Hypertensive chronic kidney disease with stage 1 through stage 4 chronic kidney disease, or unspecified chronic kidney disease: Secondary | ICD-10-CM | POA: Diagnosis not present

## 2024-08-29 DIAGNOSIS — E113392 Type 2 diabetes mellitus with moderate nonproliferative diabetic retinopathy without macular edema, left eye: Secondary | ICD-10-CM | POA: Diagnosis not present

## 2024-08-31 DIAGNOSIS — E1136 Type 2 diabetes mellitus with diabetic cataract: Secondary | ICD-10-CM | POA: Diagnosis not present

## 2024-08-31 DIAGNOSIS — Z860100 Personal history of colon polyps, unspecified: Secondary | ICD-10-CM | POA: Diagnosis not present

## 2024-08-31 DIAGNOSIS — H3530 Unspecified macular degeneration: Secondary | ICD-10-CM | POA: Diagnosis not present

## 2024-08-31 DIAGNOSIS — R159 Full incontinence of feces: Secondary | ICD-10-CM | POA: Diagnosis not present

## 2024-08-31 DIAGNOSIS — M160 Bilateral primary osteoarthritis of hip: Secondary | ICD-10-CM | POA: Diagnosis not present

## 2024-08-31 DIAGNOSIS — R339 Retention of urine, unspecified: Secondary | ICD-10-CM | POA: Diagnosis not present

## 2024-08-31 DIAGNOSIS — Z6833 Body mass index (BMI) 33.0-33.9, adult: Secondary | ICD-10-CM | POA: Diagnosis not present

## 2024-08-31 DIAGNOSIS — E669 Obesity, unspecified: Secondary | ICD-10-CM | POA: Diagnosis not present

## 2024-08-31 DIAGNOSIS — E1151 Type 2 diabetes mellitus with diabetic peripheral angiopathy without gangrene: Secondary | ICD-10-CM | POA: Diagnosis not present

## 2024-08-31 DIAGNOSIS — M810 Age-related osteoporosis without current pathological fracture: Secondary | ICD-10-CM | POA: Diagnosis not present

## 2024-08-31 DIAGNOSIS — E1169 Type 2 diabetes mellitus with other specified complication: Secondary | ICD-10-CM | POA: Diagnosis not present

## 2024-08-31 DIAGNOSIS — G4710 Hypersomnia, unspecified: Secondary | ICD-10-CM | POA: Diagnosis not present

## 2024-08-31 DIAGNOSIS — M5030 Other cervical disc degeneration, unspecified cervical region: Secondary | ICD-10-CM | POA: Diagnosis not present

## 2024-08-31 DIAGNOSIS — N184 Chronic kidney disease, stage 4 (severe): Secondary | ICD-10-CM | POA: Diagnosis not present

## 2024-08-31 DIAGNOSIS — E785 Hyperlipidemia, unspecified: Secondary | ICD-10-CM | POA: Diagnosis not present

## 2024-08-31 DIAGNOSIS — I129 Hypertensive chronic kidney disease with stage 1 through stage 4 chronic kidney disease, or unspecified chronic kidney disease: Secondary | ICD-10-CM | POA: Diagnosis not present

## 2024-08-31 DIAGNOSIS — M4727 Other spondylosis with radiculopathy, lumbosacral region: Secondary | ICD-10-CM | POA: Diagnosis not present

## 2024-08-31 DIAGNOSIS — Z981 Arthrodesis status: Secondary | ICD-10-CM | POA: Diagnosis not present

## 2024-08-31 DIAGNOSIS — E1122 Type 2 diabetes mellitus with diabetic chronic kidney disease: Secondary | ICD-10-CM | POA: Diagnosis not present

## 2024-08-31 DIAGNOSIS — E113392 Type 2 diabetes mellitus with moderate nonproliferative diabetic retinopathy without macular edema, left eye: Secondary | ICD-10-CM | POA: Diagnosis not present

## 2024-08-31 DIAGNOSIS — N3281 Overactive bladder: Secondary | ICD-10-CM | POA: Diagnosis not present

## 2024-08-31 DIAGNOSIS — D631 Anemia in chronic kidney disease: Secondary | ICD-10-CM | POA: Diagnosis not present

## 2024-08-31 DIAGNOSIS — E113551 Type 2 diabetes mellitus with stable proliferative diabetic retinopathy, right eye: Secondary | ICD-10-CM | POA: Diagnosis not present

## 2024-08-31 DIAGNOSIS — N390 Urinary tract infection, site not specified: Secondary | ICD-10-CM | POA: Diagnosis not present

## 2024-08-31 DIAGNOSIS — F03A18 Unspecified dementia, mild, with other behavioral disturbance: Secondary | ICD-10-CM | POA: Diagnosis not present

## 2024-09-03 DIAGNOSIS — L603 Nail dystrophy: Secondary | ICD-10-CM | POA: Diagnosis not present

## 2024-09-03 DIAGNOSIS — B353 Tinea pedis: Secondary | ICD-10-CM | POA: Diagnosis not present

## 2024-09-03 DIAGNOSIS — B351 Tinea unguium: Secondary | ICD-10-CM | POA: Diagnosis not present

## 2024-09-03 DIAGNOSIS — L84 Corns and callosities: Secondary | ICD-10-CM | POA: Diagnosis not present

## 2024-09-03 DIAGNOSIS — E1151 Type 2 diabetes mellitus with diabetic peripheral angiopathy without gangrene: Secondary | ICD-10-CM | POA: Diagnosis not present

## 2024-09-03 DIAGNOSIS — M205X1 Other deformities of toe(s) (acquired), right foot: Secondary | ICD-10-CM | POA: Diagnosis not present

## 2024-09-03 DIAGNOSIS — I739 Peripheral vascular disease, unspecified: Secondary | ICD-10-CM | POA: Diagnosis not present

## 2024-09-03 DIAGNOSIS — M792 Neuralgia and neuritis, unspecified: Secondary | ICD-10-CM | POA: Diagnosis not present

## 2024-09-05 DIAGNOSIS — E113551 Type 2 diabetes mellitus with stable proliferative diabetic retinopathy, right eye: Secondary | ICD-10-CM | POA: Diagnosis not present

## 2024-09-05 DIAGNOSIS — E1122 Type 2 diabetes mellitus with diabetic chronic kidney disease: Secondary | ICD-10-CM | POA: Diagnosis not present

## 2024-09-05 DIAGNOSIS — E1151 Type 2 diabetes mellitus with diabetic peripheral angiopathy without gangrene: Secondary | ICD-10-CM | POA: Diagnosis not present

## 2024-09-05 DIAGNOSIS — I129 Hypertensive chronic kidney disease with stage 1 through stage 4 chronic kidney disease, or unspecified chronic kidney disease: Secondary | ICD-10-CM | POA: Diagnosis not present

## 2024-09-05 DIAGNOSIS — N390 Urinary tract infection, site not specified: Secondary | ICD-10-CM | POA: Diagnosis not present

## 2024-09-05 DIAGNOSIS — E113392 Type 2 diabetes mellitus with moderate nonproliferative diabetic retinopathy without macular edema, left eye: Secondary | ICD-10-CM | POA: Diagnosis not present

## 2024-09-13 DIAGNOSIS — E1122 Type 2 diabetes mellitus with diabetic chronic kidney disease: Secondary | ICD-10-CM | POA: Diagnosis not present

## 2024-09-13 DIAGNOSIS — I129 Hypertensive chronic kidney disease with stage 1 through stage 4 chronic kidney disease, or unspecified chronic kidney disease: Secondary | ICD-10-CM | POA: Diagnosis not present

## 2024-09-13 DIAGNOSIS — E1151 Type 2 diabetes mellitus with diabetic peripheral angiopathy without gangrene: Secondary | ICD-10-CM | POA: Diagnosis not present

## 2024-09-13 DIAGNOSIS — E113392 Type 2 diabetes mellitus with moderate nonproliferative diabetic retinopathy without macular edema, left eye: Secondary | ICD-10-CM | POA: Diagnosis not present

## 2024-09-13 DIAGNOSIS — N390 Urinary tract infection, site not specified: Secondary | ICD-10-CM | POA: Diagnosis not present

## 2024-09-13 DIAGNOSIS — E113551 Type 2 diabetes mellitus with stable proliferative diabetic retinopathy, right eye: Secondary | ICD-10-CM | POA: Diagnosis not present

## 2024-09-20 ENCOUNTER — Encounter: Payer: Self-pay | Admitting: Internal Medicine

## 2024-09-20 ENCOUNTER — Other Ambulatory Visit (HOSPITAL_BASED_OUTPATIENT_CLINIC_OR_DEPARTMENT_OTHER): Payer: Self-pay

## 2024-09-20 ENCOUNTER — Other Ambulatory Visit (HOSPITAL_COMMUNITY): Payer: Self-pay

## 2024-09-20 ENCOUNTER — Other Ambulatory Visit: Payer: Self-pay

## 2024-09-20 DIAGNOSIS — N390 Urinary tract infection, site not specified: Secondary | ICD-10-CM | POA: Diagnosis not present

## 2024-09-20 DIAGNOSIS — E113551 Type 2 diabetes mellitus with stable proliferative diabetic retinopathy, right eye: Secondary | ICD-10-CM | POA: Diagnosis not present

## 2024-09-20 DIAGNOSIS — E1122 Type 2 diabetes mellitus with diabetic chronic kidney disease: Secondary | ICD-10-CM | POA: Diagnosis not present

## 2024-09-20 DIAGNOSIS — I129 Hypertensive chronic kidney disease with stage 1 through stage 4 chronic kidney disease, or unspecified chronic kidney disease: Secondary | ICD-10-CM | POA: Diagnosis not present

## 2024-09-20 DIAGNOSIS — E1151 Type 2 diabetes mellitus with diabetic peripheral angiopathy without gangrene: Secondary | ICD-10-CM | POA: Diagnosis not present

## 2024-09-20 DIAGNOSIS — E113392 Type 2 diabetes mellitus with moderate nonproliferative diabetic retinopathy without macular edema, left eye: Secondary | ICD-10-CM | POA: Diagnosis not present

## 2024-09-20 MED ORDER — DIVALPROEX SODIUM 125 MG PO CSDR
125.0000 mg | DELAYED_RELEASE_CAPSULE | Freq: Every day | ORAL | 3 refills | Status: AC
  Filled 2024-09-20: qty 90, 90d supply, fill #0

## 2024-09-20 MED ORDER — SERTRALINE HCL 25 MG PO TABS
25.0000 mg | ORAL_TABLET | Freq: Every day | ORAL | 3 refills | Status: AC
Start: 2024-09-20 — End: ?
  Filled 2024-09-20: qty 90, 90d supply, fill #0

## 2024-09-20 MED ORDER — GALANTAMINE HYDROBROMIDE ER 8 MG PO CP24
8.0000 mg | ORAL_CAPSULE | Freq: Every day | ORAL | 3 refills | Status: AC
  Filled 2024-09-20 – 2024-10-29 (×2): qty 90, 90d supply, fill #0

## 2024-09-20 MED ORDER — HYDRALAZINE HCL 50 MG PO TABS
50.0000 mg | ORAL_TABLET | Freq: Every evening | ORAL | 3 refills | Status: AC
Start: 2024-09-20 — End: ?
  Filled 2024-09-20: qty 90, 90d supply, fill #0

## 2024-09-20 MED ORDER — POLYSACCHARIDE IRON COMPLEX 150 MG PO CAPS
150.0000 mg | ORAL_CAPSULE | ORAL | 3 refills | Status: AC
  Filled 2024-09-20: qty 12, 84d supply, fill #0

## 2024-09-23 DIAGNOSIS — E1122 Type 2 diabetes mellitus with diabetic chronic kidney disease: Secondary | ICD-10-CM | POA: Diagnosis not present

## 2024-09-23 DIAGNOSIS — I129 Hypertensive chronic kidney disease with stage 1 through stage 4 chronic kidney disease, or unspecified chronic kidney disease: Secondary | ICD-10-CM | POA: Diagnosis not present

## 2024-09-23 DIAGNOSIS — E1151 Type 2 diabetes mellitus with diabetic peripheral angiopathy without gangrene: Secondary | ICD-10-CM | POA: Diagnosis not present

## 2024-09-23 DIAGNOSIS — E113551 Type 2 diabetes mellitus with stable proliferative diabetic retinopathy, right eye: Secondary | ICD-10-CM | POA: Diagnosis not present

## 2024-09-23 DIAGNOSIS — N390 Urinary tract infection, site not specified: Secondary | ICD-10-CM | POA: Diagnosis not present

## 2024-09-23 DIAGNOSIS — E113392 Type 2 diabetes mellitus with moderate nonproliferative diabetic retinopathy without macular edema, left eye: Secondary | ICD-10-CM | POA: Diagnosis not present

## 2024-10-03 ENCOUNTER — Other Ambulatory Visit (HOSPITAL_BASED_OUTPATIENT_CLINIC_OR_DEPARTMENT_OTHER): Payer: Self-pay

## 2024-10-11 ENCOUNTER — Other Ambulatory Visit (HOSPITAL_BASED_OUTPATIENT_CLINIC_OR_DEPARTMENT_OTHER): Payer: Self-pay

## 2024-10-12 ENCOUNTER — Other Ambulatory Visit (HOSPITAL_BASED_OUTPATIENT_CLINIC_OR_DEPARTMENT_OTHER): Payer: Self-pay

## 2024-10-29 ENCOUNTER — Other Ambulatory Visit: Payer: Self-pay | Admitting: Physical Medicine and Rehabilitation

## 2024-10-29 ENCOUNTER — Other Ambulatory Visit: Payer: Self-pay

## 2024-10-29 ENCOUNTER — Other Ambulatory Visit (HOSPITAL_BASED_OUTPATIENT_CLINIC_OR_DEPARTMENT_OTHER): Payer: Self-pay

## 2024-10-29 MED ORDER — NEBIVOLOL HCL 5 MG PO TABS
5.0000 mg | ORAL_TABLET | Freq: Every day | ORAL | 3 refills | Status: AC
  Filled 2024-10-29: qty 90, 90d supply, fill #0

## 2024-10-29 MED ORDER — GALANTAMINE HYDROBROMIDE ER 8 MG PO CP24
8.0000 mg | ORAL_CAPSULE | Freq: Every day | ORAL | 3 refills | Status: AC
  Filled 2024-10-29: qty 90, 90d supply, fill #0

## 2024-10-29 MED ORDER — AMLODIPINE BESYLATE 10 MG PO TABS
10.0000 mg | ORAL_TABLET | Freq: Every evening | ORAL | 3 refills | Status: AC
  Filled 2024-10-29: qty 90, 90d supply, fill #0

## 2024-11-01 ENCOUNTER — Other Ambulatory Visit (HOSPITAL_COMMUNITY): Payer: Self-pay

## 2024-11-04 ENCOUNTER — Other Ambulatory Visit (HOSPITAL_BASED_OUTPATIENT_CLINIC_OR_DEPARTMENT_OTHER): Payer: Self-pay

## 2024-11-04 MED ORDER — FLUCONAZOLE 150 MG PO TABS
150.0000 mg | ORAL_TABLET | ORAL | 2 refills | Status: AC
  Filled 2024-11-04: qty 2, 4d supply, fill #0

## 2024-11-21 ENCOUNTER — Other Ambulatory Visit (HOSPITAL_BASED_OUTPATIENT_CLINIC_OR_DEPARTMENT_OTHER): Payer: Self-pay

## 2024-12-03 ENCOUNTER — Other Ambulatory Visit (HOSPITAL_BASED_OUTPATIENT_CLINIC_OR_DEPARTMENT_OTHER): Payer: Self-pay

## 2024-12-04 ENCOUNTER — Other Ambulatory Visit: Payer: Self-pay

## 2024-12-04 ENCOUNTER — Other Ambulatory Visit (HOSPITAL_BASED_OUTPATIENT_CLINIC_OR_DEPARTMENT_OTHER): Payer: Self-pay

## 2024-12-04 ENCOUNTER — Encounter: Payer: Self-pay | Admitting: Internal Medicine

## 2024-12-04 MED ORDER — MONTELUKAST SODIUM 10 MG PO TABS
10.0000 mg | ORAL_TABLET | Freq: Every day | ORAL | 3 refills | Status: AC
  Filled 2024-12-04: qty 90, 90d supply, fill #0

## 2024-12-04 MED ORDER — DOCUSATE SODIUM 100 MG PO CAPS
200.0000 mg | ORAL_CAPSULE | Freq: Every day | ORAL | 3 refills | Status: AC
  Filled 2024-12-04: qty 180, 90d supply, fill #0

## 2024-12-04 MED ORDER — DIVALPROEX SODIUM 125 MG PO CSDR: 125.0000 mg | DELAYED_RELEASE_CAPSULE | Freq: Every day | ORAL | 3 refills | Status: AC

## 2024-12-04 MED ORDER — FLUTICASONE PROPIONATE 50 MCG/ACT NA SUSP
1.0000 | Freq: Every day | NASAL | 3 refills | Status: AC
  Filled 2024-12-04: qty 16, 60d supply, fill #0

## 2024-12-04 MED ORDER — LINZESS 145 MCG PO CAPS
145.0000 ug | ORAL_CAPSULE | Freq: Every morning | ORAL | 3 refills | Status: AC
  Filled 2024-12-04: qty 90, 90d supply, fill #0

## 2024-12-04 MED ORDER — GALANTAMINE HYDROBROMIDE ER 8 MG PO CP24
8.0000 mg | ORAL_CAPSULE | Freq: Every day | ORAL | 3 refills | Status: AC
  Filled 2024-12-04: qty 90, 90d supply, fill #0

## 2024-12-04 MED ORDER — ROSUVASTATIN CALCIUM 5 MG PO TABS
5.0000 mg | ORAL_TABLET | Freq: Every day | ORAL | 3 refills | Status: AC
  Filled 2024-12-04: qty 90, 90d supply, fill #0

## 2024-12-04 MED ORDER — HYDRALAZINE HCL 50 MG PO TABS
50.0000 mg | ORAL_TABLET | Freq: Every evening | ORAL | 3 refills | Status: AC
  Filled 2024-12-04: qty 90, 90d supply, fill #0

## 2024-12-04 MED ORDER — POLYSACCHARIDE IRON COMPLEX 150 MG PO CAPS
150.0000 mg | ORAL_CAPSULE | ORAL | 3 refills | Status: AC
  Filled 2024-12-04: qty 6, 84d supply, fill #0

## 2024-12-04 MED ORDER — NEBIVOLOL HCL 5 MG PO TABS
5.0000 mg | ORAL_TABLET | Freq: Every day | ORAL | 3 refills | Status: AC
  Filled 2024-12-04: qty 90, 90d supply, fill #0

## 2024-12-04 MED ORDER — TAMSULOSIN HCL 0.4 MG PO CAPS
0.4000 mg | ORAL_CAPSULE | Freq: Every evening | ORAL | 3 refills | Status: AC
  Filled 2024-12-04: qty 90, 90d supply, fill #0

## 2024-12-04 MED ORDER — AMLODIPINE BESYLATE 10 MG PO TABS: 10.0000 mg | ORAL_TABLET | Freq: Every evening | ORAL | 3 refills | Status: AC

## 2024-12-04 MED ORDER — SERTRALINE HCL 25 MG PO TABS: 25.0000 mg | ORAL_TABLET | Freq: Every day | ORAL | 3 refills | Status: AC

## 2024-12-06 ENCOUNTER — Other Ambulatory Visit (HOSPITAL_BASED_OUTPATIENT_CLINIC_OR_DEPARTMENT_OTHER): Payer: Self-pay

## 2025-01-06 ENCOUNTER — Ambulatory Visit

## 2025-01-07 ENCOUNTER — Inpatient Hospital Stay

## 2025-01-07 ENCOUNTER — Inpatient Hospital Stay: Admitting: Hematology
